# Patient Record
Sex: Male | Born: 1935 | Race: White | Hispanic: No | Smoking: Former smoker
Health system: Southern US, Community
[De-identification: ages and names within clinical notes are randomized; demographics above are authoritative.]

## PROBLEM LIST (undated history)

## (undated) DIAGNOSIS — Z8719 Personal history of other diseases of the digestive system: Secondary | ICD-10-CM

## (undated) DIAGNOSIS — Z9289 Personal history of other medical treatment: Secondary | ICD-10-CM

## (undated) DIAGNOSIS — I639 Cerebral infarction, unspecified: Secondary | ICD-10-CM

## (undated) DIAGNOSIS — R351 Nocturia: Secondary | ICD-10-CM

## (undated) DIAGNOSIS — Z8711 Personal history of peptic ulcer disease: Secondary | ICD-10-CM

## (undated) DIAGNOSIS — R3912 Poor urinary stream: Secondary | ICD-10-CM

## (undated) DIAGNOSIS — R0600 Dyspnea, unspecified: Secondary | ICD-10-CM

## (undated) DIAGNOSIS — Z973 Presence of spectacles and contact lenses: Secondary | ICD-10-CM

## (undated) DIAGNOSIS — Z974 Presence of external hearing-aid: Secondary | ICD-10-CM

## (undated) DIAGNOSIS — G2581 Restless legs syndrome: Secondary | ICD-10-CM

## (undated) DIAGNOSIS — F329 Major depressive disorder, single episode, unspecified: Secondary | ICD-10-CM

## (undated) DIAGNOSIS — E785 Hyperlipidemia, unspecified: Secondary | ICD-10-CM

## (undated) DIAGNOSIS — J209 Acute bronchitis, unspecified: Secondary | ICD-10-CM

## (undated) DIAGNOSIS — C679 Malignant neoplasm of bladder, unspecified: Secondary | ICD-10-CM

## (undated) DIAGNOSIS — G250 Essential tremor: Secondary | ICD-10-CM

## (undated) DIAGNOSIS — Z860100 Personal history of colon polyps, unspecified: Secondary | ICD-10-CM

## (undated) DIAGNOSIS — N400 Enlarged prostate without lower urinary tract symptoms: Secondary | ICD-10-CM

## (undated) DIAGNOSIS — M545 Low back pain, unspecified: Secondary | ICD-10-CM

## (undated) DIAGNOSIS — Z972 Presence of dental prosthetic device (complete) (partial): Secondary | ICD-10-CM

## (undated) DIAGNOSIS — R569 Unspecified convulsions: Secondary | ICD-10-CM

## (undated) DIAGNOSIS — M5137 Other intervertebral disc degeneration, lumbosacral region: Secondary | ICD-10-CM

## (undated) DIAGNOSIS — K760 Fatty (change of) liver, not elsewhere classified: Secondary | ICD-10-CM

## (undated) DIAGNOSIS — R001 Bradycardia, unspecified: Secondary | ICD-10-CM

## (undated) DIAGNOSIS — I2510 Atherosclerotic heart disease of native coronary artery without angina pectoris: Secondary | ICD-10-CM

## (undated) DIAGNOSIS — J449 Chronic obstructive pulmonary disease, unspecified: Secondary | ICD-10-CM

## (undated) DIAGNOSIS — R05 Cough: Secondary | ICD-10-CM

## (undated) DIAGNOSIS — K5730 Diverticulosis of large intestine without perforation or abscess without bleeding: Secondary | ICD-10-CM

## (undated) DIAGNOSIS — F32A Depression, unspecified: Secondary | ICD-10-CM

## (undated) DIAGNOSIS — Z8551 Personal history of malignant neoplasm of bladder: Secondary | ICD-10-CM

## (undated) DIAGNOSIS — M51379 Other intervertebral disc degeneration, lumbosacral region without mention of lumbar back pain or lower extremity pain: Secondary | ICD-10-CM

## (undated) DIAGNOSIS — G8929 Other chronic pain: Secondary | ICD-10-CM

## (undated) DIAGNOSIS — I701 Atherosclerosis of renal artery: Secondary | ICD-10-CM

## (undated) DIAGNOSIS — K219 Gastro-esophageal reflux disease without esophagitis: Secondary | ICD-10-CM

## (undated) DIAGNOSIS — R058 Other specified cough: Secondary | ICD-10-CM

## (undated) DIAGNOSIS — I509 Heart failure, unspecified: Secondary | ICD-10-CM

## (undated) DIAGNOSIS — I10 Essential (primary) hypertension: Secondary | ICD-10-CM

## (undated) DIAGNOSIS — R42 Dizziness and giddiness: Secondary | ICD-10-CM

## (undated) DIAGNOSIS — J189 Pneumonia, unspecified organism: Secondary | ICD-10-CM

## (undated) DIAGNOSIS — M069 Rheumatoid arthritis, unspecified: Secondary | ICD-10-CM

## (undated) DIAGNOSIS — Z87442 Personal history of urinary calculi: Secondary | ICD-10-CM

## (undated) DIAGNOSIS — H409 Unspecified glaucoma: Secondary | ICD-10-CM

## (undated) HISTORY — PX: JOINT REPLACEMENT: SHX530

## (undated) HISTORY — DX: Major depressive disorder, single episode, unspecified: F32.9

## (undated) HISTORY — DX: Depression, unspecified: F32.A

## (undated) HISTORY — PX: CARDIAC CATHETERIZATION: SHX172

## (undated) HISTORY — PX: TONSILLECTOMY AND ADENOIDECTOMY: SUR1326

## (undated) HISTORY — DX: Malignant neoplasm of bladder, unspecified: C67.9

## (undated) HISTORY — DX: Rheumatoid arthritis, unspecified: M06.9

## (undated) HISTORY — DX: Bradycardia, unspecified: R00.1

## (undated) HISTORY — DX: Hyperlipidemia, unspecified: E78.5

## (undated) HISTORY — DX: Atherosclerotic heart disease of native coronary artery without angina pectoris: I25.10

## (undated) HISTORY — DX: Essential (primary) hypertension: I10

## (undated) HISTORY — DX: Gastro-esophageal reflux disease without esophagitis: K21.9

## (undated) HISTORY — DX: Heart failure, unspecified: I50.9

## (undated) HISTORY — DX: Diverticulosis of large intestine without perforation or abscess without bleeding: K57.30

---

## 2000-01-13 ENCOUNTER — Emergency Department (HOSPITAL_COMMUNITY): Admission: EM | Admit: 2000-01-13 | Discharge: 2000-01-13 | Payer: Self-pay | Admitting: Emergency Medicine

## 2000-01-13 ENCOUNTER — Encounter: Payer: Self-pay | Admitting: Emergency Medicine

## 2000-02-06 ENCOUNTER — Encounter: Admission: RE | Admit: 2000-02-06 | Discharge: 2000-02-20 | Payer: Self-pay | Admitting: Internal Medicine

## 2000-03-19 ENCOUNTER — Ambulatory Visit (HOSPITAL_COMMUNITY): Admission: RE | Admit: 2000-03-19 | Discharge: 2000-03-19 | Payer: Self-pay | Admitting: Orthopedic Surgery

## 2000-03-19 ENCOUNTER — Encounter: Payer: Self-pay | Admitting: Orthopedic Surgery

## 2000-04-22 ENCOUNTER — Encounter: Admission: RE | Admit: 2000-04-22 | Discharge: 2000-05-28 | Payer: Self-pay | Admitting: Orthopedic Surgery

## 2000-05-21 ENCOUNTER — Ambulatory Visit (HOSPITAL_COMMUNITY): Admission: RE | Admit: 2000-05-21 | Discharge: 2000-05-21 | Payer: Self-pay | Admitting: Orthopedic Surgery

## 2000-05-21 ENCOUNTER — Encounter: Payer: Self-pay | Admitting: Orthopedic Surgery

## 2000-06-29 ENCOUNTER — Ambulatory Visit (HOSPITAL_COMMUNITY): Admission: RE | Admit: 2000-06-29 | Discharge: 2000-06-29 | Payer: Self-pay | Admitting: Orthopedic Surgery

## 2000-06-29 ENCOUNTER — Encounter: Payer: Self-pay | Admitting: Orthopedic Surgery

## 2000-06-29 HISTORY — PX: HIP ARTHROSCOPY W/ LABRAL DEBRIDEMENT: SHX1749

## 2001-11-11 ENCOUNTER — Encounter: Payer: Self-pay | Admitting: Gastroenterology

## 2002-09-16 ENCOUNTER — Encounter: Payer: Self-pay | Admitting: Urology

## 2002-09-16 ENCOUNTER — Encounter: Admission: RE | Admit: 2002-09-16 | Discharge: 2002-09-16 | Payer: Self-pay | Admitting: Urology

## 2002-09-21 ENCOUNTER — Ambulatory Visit (HOSPITAL_BASED_OUTPATIENT_CLINIC_OR_DEPARTMENT_OTHER): Admission: RE | Admit: 2002-09-21 | Discharge: 2002-09-21 | Payer: Self-pay | Admitting: Interventional Radiology

## 2002-09-21 ENCOUNTER — Encounter (INDEPENDENT_AMBULATORY_CARE_PROVIDER_SITE_OTHER): Payer: Self-pay | Admitting: *Deleted

## 2002-09-21 HISTORY — PX: TRANSURETHRAL RESECTION OF BLADDER TUMOR: SHX2575

## 2003-01-20 ENCOUNTER — Emergency Department (HOSPITAL_COMMUNITY): Admission: EM | Admit: 2003-01-20 | Discharge: 2003-01-20 | Payer: Self-pay | Admitting: Emergency Medicine

## 2003-10-09 ENCOUNTER — Emergency Department (HOSPITAL_COMMUNITY): Admission: EM | Admit: 2003-10-09 | Discharge: 2003-10-09 | Payer: Self-pay | Admitting: Emergency Medicine

## 2005-06-26 ENCOUNTER — Ambulatory Visit: Payer: Self-pay | Admitting: Internal Medicine

## 2005-07-16 ENCOUNTER — Encounter (INDEPENDENT_AMBULATORY_CARE_PROVIDER_SITE_OTHER): Payer: Self-pay | Admitting: Specialist

## 2005-07-16 ENCOUNTER — Ambulatory Visit (HOSPITAL_COMMUNITY): Admission: RE | Admit: 2005-07-16 | Discharge: 2005-07-17 | Payer: Self-pay | Admitting: Specialist

## 2005-07-16 HISTORY — PX: LUMBAR DISC SURGERY: SHX700

## 2006-09-07 ENCOUNTER — Ambulatory Visit: Payer: Self-pay | Admitting: Internal Medicine

## 2006-11-01 ENCOUNTER — Inpatient Hospital Stay (HOSPITAL_COMMUNITY): Admission: EM | Admit: 2006-11-01 | Discharge: 2006-11-03 | Payer: Self-pay | Admitting: Internal Medicine

## 2006-11-01 ENCOUNTER — Encounter: Payer: Self-pay | Admitting: Emergency Medicine

## 2006-11-01 ENCOUNTER — Ambulatory Visit: Payer: Self-pay | Admitting: Internal Medicine

## 2006-11-02 ENCOUNTER — Ambulatory Visit: Payer: Self-pay | Admitting: Cardiovascular Disease

## 2006-11-02 ENCOUNTER — Encounter: Payer: Self-pay | Admitting: Cardiovascular Disease

## 2006-11-06 ENCOUNTER — Ambulatory Visit: Payer: Self-pay

## 2006-11-10 ENCOUNTER — Ambulatory Visit: Payer: Self-pay | Admitting: Internal Medicine

## 2006-12-31 ENCOUNTER — Ambulatory Visit: Payer: Self-pay | Admitting: Internal Medicine

## 2006-12-31 LAB — CONVERTED CEMR LAB
AST: 24 units/L (ref 0–37)
Basophils Relative: 0.3 % (ref 0.0–1.0)
Bilirubin, Direct: 0.1 mg/dL (ref 0.0–0.3)
CO2: 31 meq/L (ref 19–32)
Chloride: 105 meq/L (ref 96–112)
Creatinine, Ser: 1.1 mg/dL (ref 0.4–1.5)
Eosinophils Absolute: 0.2 10*3/uL (ref 0.0–0.6)
Eosinophils Relative: 3.1 % (ref 0.0–5.0)
GFR calc non Af Amer: 71 mL/min
Glucose, Bld: 104 mg/dL — ABNORMAL HIGH (ref 70–99)
HCT: 45.1 % (ref 39.0–52.0)
Lymphocytes Relative: 20.4 % (ref 12.0–46.0)
MCV: 89.2 fL (ref 78.0–100.0)
Neutrophils Relative %: 67 % (ref 43.0–77.0)
RBC: 5.05 M/uL (ref 4.22–5.81)
Sodium: 141 meq/L (ref 135–145)
TSH: 0.76 microintl units/mL (ref 0.35–5.50)
Total Bilirubin: 0.4 mg/dL (ref 0.3–1.2)
Total Protein: 6.7 g/dL (ref 6.0–8.3)
WBC: 6.9 10*3/uL (ref 4.5–10.5)

## 2007-01-11 ENCOUNTER — Ambulatory Visit: Payer: Self-pay | Admitting: Internal Medicine

## 2007-02-05 ENCOUNTER — Ambulatory Visit: Payer: Self-pay

## 2007-02-08 ENCOUNTER — Ambulatory Visit: Payer: Self-pay | Admitting: Internal Medicine

## 2007-02-08 LAB — CONVERTED CEMR LAB
Basophils Absolute: 0 10*3/uL (ref 0.0–0.1)
Calcium: 9.5 mg/dL (ref 8.4–10.5)
Chloride: 102 meq/L (ref 96–112)
Eosinophils Absolute: 0.1 10*3/uL (ref 0.0–0.6)
GFR calc Af Amer: 95 mL/min
GFR calc non Af Amer: 79 mL/min
Lymphocytes Relative: 25.5 % (ref 12.0–46.0)
MCHC: 34.4 g/dL (ref 30.0–36.0)
MCV: 90.8 fL (ref 78.0–100.0)
Monocytes Relative: 5.4 % (ref 3.0–11.0)
Neutro Abs: 4.3 10*3/uL (ref 1.4–7.7)
Platelets: 275 10*3/uL (ref 150–400)
RBC: 5.07 M/uL (ref 4.22–5.81)
aPTT: 32.1 s (ref 26.5–36.5)

## 2007-02-12 ENCOUNTER — Inpatient Hospital Stay (HOSPITAL_BASED_OUTPATIENT_CLINIC_OR_DEPARTMENT_OTHER): Admission: RE | Admit: 2007-02-12 | Discharge: 2007-02-12 | Payer: Self-pay | Admitting: Cardiovascular Disease

## 2007-02-12 ENCOUNTER — Ambulatory Visit: Payer: Self-pay | Admitting: Cardiovascular Disease

## 2007-03-12 ENCOUNTER — Ambulatory Visit: Payer: Self-pay | Admitting: Internal Medicine

## 2007-08-25 ENCOUNTER — Encounter: Payer: Self-pay | Admitting: Internal Medicine

## 2007-11-24 ENCOUNTER — Encounter: Payer: Self-pay | Admitting: Internal Medicine

## 2007-11-24 DIAGNOSIS — E785 Hyperlipidemia, unspecified: Secondary | ICD-10-CM

## 2007-11-24 DIAGNOSIS — N183 Chronic kidney disease, stage 3 unspecified: Secondary | ICD-10-CM | POA: Insufficient documentation

## 2007-11-24 DIAGNOSIS — M5137 Other intervertebral disc degeneration, lumbosacral region: Secondary | ICD-10-CM | POA: Insufficient documentation

## 2007-11-24 DIAGNOSIS — M069 Rheumatoid arthritis, unspecified: Secondary | ICD-10-CM

## 2007-11-24 DIAGNOSIS — K219 Gastro-esophageal reflux disease without esophagitis: Secondary | ICD-10-CM | POA: Insufficient documentation

## 2007-11-24 DIAGNOSIS — Z9089 Acquired absence of other organs: Secondary | ICD-10-CM | POA: Insufficient documentation

## 2007-11-24 DIAGNOSIS — I10 Essential (primary) hypertension: Secondary | ICD-10-CM

## 2007-11-24 DIAGNOSIS — K449 Diaphragmatic hernia without obstruction or gangrene: Secondary | ICD-10-CM

## 2007-11-24 DIAGNOSIS — N259 Disorder resulting from impaired renal tubular function, unspecified: Secondary | ICD-10-CM

## 2008-02-07 ENCOUNTER — Ambulatory Visit: Payer: Self-pay | Admitting: Internal Medicine

## 2008-02-07 DIAGNOSIS — R059 Cough, unspecified: Secondary | ICD-10-CM | POA: Insufficient documentation

## 2008-02-07 DIAGNOSIS — Z860100 Personal history of colon polyps, unspecified: Secondary | ICD-10-CM | POA: Insufficient documentation

## 2008-02-07 DIAGNOSIS — C679 Malignant neoplasm of bladder, unspecified: Secondary | ICD-10-CM | POA: Insufficient documentation

## 2008-02-07 DIAGNOSIS — R05 Cough: Secondary | ICD-10-CM

## 2008-02-07 DIAGNOSIS — J309 Allergic rhinitis, unspecified: Secondary | ICD-10-CM

## 2008-02-07 LAB — CONVERTED CEMR LAB
ALT: 29 units/L (ref 0–53)
Albumin: 4.2 g/dL (ref 3.5–5.2)
BUN: 7 mg/dL (ref 6–23)
Bacteria, UA: NEGATIVE
Bilirubin Urine: NEGATIVE
Bilirubin, Direct: 0.1 mg/dL (ref 0.0–0.3)
CO2: 30 meq/L (ref 19–32)
Calcium: 9.2 mg/dL (ref 8.4–10.5)
Creatinine, Ser: 0.9 mg/dL (ref 0.4–1.5)
Crystals: NEGATIVE
Eosinophils Absolute: 0.1 10*3/uL (ref 0.0–0.7)
Eosinophils Relative: 2.7 % (ref 0.0–5.0)
GFR calc Af Amer: 107 mL/min
Glucose, Bld: 112 mg/dL — ABNORMAL HIGH (ref 70–99)
HCT: 46 % (ref 39.0–52.0)
HDL: 30.5 mg/dL — ABNORMAL LOW (ref >39.0)
Hemoglobin, Urine: NEGATIVE
Hemoglobin: 15.8 g/dL (ref 13.0–17.0)
MCV: 92.4 fL (ref 78.0–100.0)
Monocytes Absolute: 0.4 10*3/uL (ref 0.1–1.0)
Monocytes Relative: 8.2 % (ref 3.0–12.0)
Neutro Abs: 3.3 10*3/uL (ref 1.4–7.7)
PSA: 0.21 ng/mL (ref 0.10–4.00)
Platelets: 205 10*3/uL (ref 150–400)
RDW: 11.7 % (ref 11.5–14.6)
Sodium: 142 meq/L (ref 135–145)
Total CHOL/HDL Ratio: 4.5
Total Protein, Urine: NEGATIVE mg/dL
Total Protein: 6.7 g/dL (ref 6.0–8.3)
Triglycerides: 92 mg/dL (ref 0–149)
Urine Glucose: NEGATIVE mg/dL
Urobilinogen, UA: 0.2 (ref 0.0–1.0)
WBC: 5.2 10*3/uL (ref 4.5–10.5)

## 2008-02-11 ENCOUNTER — Ambulatory Visit: Payer: Self-pay | Admitting: Gastroenterology

## 2008-02-22 ENCOUNTER — Telehealth: Payer: Self-pay | Admitting: Gastroenterology

## 2008-02-28 ENCOUNTER — Ambulatory Visit: Payer: Self-pay | Admitting: Gastroenterology

## 2008-03-21 ENCOUNTER — Telehealth (INDEPENDENT_AMBULATORY_CARE_PROVIDER_SITE_OTHER): Payer: Self-pay | Admitting: *Deleted

## 2008-03-24 ENCOUNTER — Ambulatory Visit: Payer: Self-pay | Admitting: Internal Medicine

## 2008-10-06 ENCOUNTER — Ambulatory Visit: Payer: Self-pay | Admitting: Cardiology

## 2008-10-06 ENCOUNTER — Inpatient Hospital Stay (HOSPITAL_COMMUNITY): Admission: EM | Admit: 2008-10-06 | Discharge: 2008-10-07 | Payer: Self-pay | Admitting: Emergency Medicine

## 2008-10-06 ENCOUNTER — Ambulatory Visit: Payer: Self-pay | Admitting: Internal Medicine

## 2008-10-17 ENCOUNTER — Ambulatory Visit: Payer: Self-pay | Admitting: Internal Medicine

## 2008-10-17 DIAGNOSIS — R42 Dizziness and giddiness: Secondary | ICD-10-CM | POA: Insufficient documentation

## 2008-10-17 DIAGNOSIS — R079 Chest pain, unspecified: Secondary | ICD-10-CM | POA: Insufficient documentation

## 2008-10-17 LAB — CONVERTED CEMR LAB
BUN: 12 mg/dL (ref 6–23)
Creatinine, Ser: 1 mg/dL (ref 0.4–1.5)
GFR calc non Af Amer: 78.23 mL/min (ref >60)
Glucose, Bld: 105 mg/dL — ABNORMAL HIGH (ref 70–99)
Potassium: 3.9 meq/L (ref 3.5–5.1)

## 2008-10-20 ENCOUNTER — Encounter: Payer: Self-pay | Admitting: Internal Medicine

## 2008-10-23 LAB — CONVERTED CEMR LAB
Metaneph Total, Ur: 251 ug/24hr (ref 224–832)
Metanephrines, Ur: 92 (ref 90–315)

## 2008-11-02 ENCOUNTER — Encounter: Payer: Self-pay | Admitting: Internal Medicine

## 2008-11-02 ENCOUNTER — Ambulatory Visit: Payer: Self-pay

## 2008-11-07 ENCOUNTER — Ambulatory Visit: Payer: Self-pay | Admitting: Internal Medicine

## 2009-01-30 ENCOUNTER — Telehealth (INDEPENDENT_AMBULATORY_CARE_PROVIDER_SITE_OTHER): Payer: Self-pay | Admitting: *Deleted

## 2009-03-13 ENCOUNTER — Ambulatory Visit: Payer: Self-pay | Admitting: Cardiovascular Disease

## 2009-03-14 ENCOUNTER — Inpatient Hospital Stay (HOSPITAL_COMMUNITY): Admission: EM | Admit: 2009-03-14 | Discharge: 2009-03-14 | Payer: Self-pay | Admitting: Emergency Medicine

## 2009-03-20 ENCOUNTER — Telehealth: Payer: Self-pay | Admitting: Gastroenterology

## 2009-03-20 ENCOUNTER — Ambulatory Visit: Payer: Self-pay | Admitting: Internal Medicine

## 2009-03-20 DIAGNOSIS — I498 Other specified cardiac arrhythmias: Secondary | ICD-10-CM | POA: Insufficient documentation

## 2009-03-28 DIAGNOSIS — K5730 Diverticulosis of large intestine without perforation or abscess without bleeding: Secondary | ICD-10-CM | POA: Insufficient documentation

## 2009-03-28 DIAGNOSIS — K7689 Other specified diseases of liver: Secondary | ICD-10-CM | POA: Insufficient documentation

## 2009-04-03 ENCOUNTER — Ambulatory Visit: Payer: Self-pay | Admitting: Gastroenterology

## 2009-04-17 ENCOUNTER — Ambulatory Visit (HOSPITAL_COMMUNITY): Admission: RE | Admit: 2009-04-17 | Discharge: 2009-04-17 | Payer: Self-pay | Admitting: Gastroenterology

## 2009-04-19 ENCOUNTER — Ambulatory Visit: Payer: Self-pay | Admitting: Internal Medicine

## 2009-05-01 ENCOUNTER — Telehealth: Payer: Self-pay | Admitting: Gastroenterology

## 2009-05-04 ENCOUNTER — Encounter: Payer: Self-pay | Admitting: Gastroenterology

## 2009-05-09 ENCOUNTER — Ambulatory Visit: Payer: Self-pay | Admitting: Internal Medicine

## 2009-06-13 ENCOUNTER — Telehealth: Payer: Self-pay | Admitting: Internal Medicine

## 2009-06-15 ENCOUNTER — Telehealth: Payer: Self-pay | Admitting: Internal Medicine

## 2009-06-19 ENCOUNTER — Telehealth: Payer: Self-pay | Admitting: Gastroenterology

## 2009-06-28 ENCOUNTER — Telehealth: Payer: Self-pay | Admitting: Internal Medicine

## 2009-06-29 ENCOUNTER — Telehealth: Payer: Self-pay | Admitting: Internal Medicine

## 2009-07-02 ENCOUNTER — Telehealth: Payer: Self-pay | Admitting: Gastroenterology

## 2009-07-03 ENCOUNTER — Encounter: Payer: Self-pay | Admitting: Internal Medicine

## 2009-07-03 ENCOUNTER — Telehealth: Payer: Self-pay | Admitting: Internal Medicine

## 2009-07-04 ENCOUNTER — Telehealth: Payer: Self-pay | Admitting: Internal Medicine

## 2009-07-09 ENCOUNTER — Encounter: Payer: Self-pay | Admitting: Internal Medicine

## 2009-07-10 ENCOUNTER — Ambulatory Visit: Payer: Self-pay | Admitting: Gastroenterology

## 2009-07-10 ENCOUNTER — Encounter: Payer: Self-pay | Admitting: Internal Medicine

## 2009-07-10 DIAGNOSIS — K625 Hemorrhage of anus and rectum: Secondary | ICD-10-CM | POA: Insufficient documentation

## 2009-07-10 DIAGNOSIS — R197 Diarrhea, unspecified: Secondary | ICD-10-CM | POA: Insufficient documentation

## 2009-07-10 LAB — CONVERTED CEMR LAB
Basophils Relative: 0.1 % (ref 0.0–3.0)
Eosinophils Absolute: 0.1 10*3/uL (ref 0.0–0.7)
Ferritin: 23.3 ng/mL (ref 22.0–322.0)
Hemoglobin: 15.5 g/dL (ref 13.0–17.0)
Iron: 117 ug/dL (ref 42–165)
MCHC: 33.4 g/dL (ref 30.0–36.0)
MCV: 93.2 fL (ref 78.0–100.0)
Monocytes Absolute: 0.6 10*3/uL (ref 0.1–1.0)
Neutro Abs: 3.7 10*3/uL (ref 1.4–7.7)
RBC: 4.98 M/uL (ref 4.22–5.81)
RDW: 12.1 % (ref 11.5–14.6)
Vitamin B-12: 435 pg/mL (ref 211–911)

## 2009-07-11 ENCOUNTER — Ambulatory Visit: Payer: Self-pay | Admitting: Gastroenterology

## 2009-07-11 ENCOUNTER — Telehealth: Payer: Self-pay | Admitting: Gastroenterology

## 2009-07-16 ENCOUNTER — Encounter: Payer: Self-pay | Admitting: Gastroenterology

## 2009-07-17 ENCOUNTER — Telehealth: Payer: Self-pay | Admitting: Internal Medicine

## 2009-07-26 ENCOUNTER — Telehealth: Payer: Self-pay | Admitting: Internal Medicine

## 2009-08-06 ENCOUNTER — Telehealth: Payer: Self-pay | Admitting: Internal Medicine

## 2009-08-09 ENCOUNTER — Telehealth: Payer: Self-pay | Admitting: Gastroenterology

## 2009-08-10 DIAGNOSIS — R21 Rash and other nonspecific skin eruption: Secondary | ICD-10-CM

## 2009-08-19 ENCOUNTER — Emergency Department (HOSPITAL_COMMUNITY): Admission: EM | Admit: 2009-08-19 | Discharge: 2009-08-19 | Payer: Self-pay | Admitting: Emergency Medicine

## 2009-08-20 ENCOUNTER — Telehealth: Payer: Self-pay | Admitting: Internal Medicine

## 2009-09-15 ENCOUNTER — Encounter: Payer: Self-pay | Admitting: Internal Medicine

## 2009-09-17 ENCOUNTER — Telehealth (INDEPENDENT_AMBULATORY_CARE_PROVIDER_SITE_OTHER): Payer: Self-pay | Admitting: *Deleted

## 2009-09-28 ENCOUNTER — Encounter: Payer: Self-pay | Admitting: Internal Medicine

## 2009-10-01 ENCOUNTER — Encounter: Payer: Self-pay | Admitting: Internal Medicine

## 2009-10-01 ENCOUNTER — Ambulatory Visit: Payer: Self-pay

## 2009-11-27 ENCOUNTER — Ambulatory Visit: Payer: Self-pay | Admitting: Internal Medicine

## 2010-02-05 ENCOUNTER — Ambulatory Visit: Payer: Self-pay | Admitting: Internal Medicine

## 2010-02-05 DIAGNOSIS — M21619 Bunion of unspecified foot: Secondary | ICD-10-CM | POA: Insufficient documentation

## 2010-04-25 ENCOUNTER — Ambulatory Visit: Payer: Self-pay | Admitting: Internal Medicine

## 2010-05-14 ENCOUNTER — Ambulatory Visit: Payer: Self-pay | Admitting: Internal Medicine

## 2010-05-14 ENCOUNTER — Telehealth: Payer: Self-pay | Admitting: Internal Medicine

## 2010-05-14 DIAGNOSIS — M542 Cervicalgia: Secondary | ICD-10-CM | POA: Insufficient documentation

## 2010-05-24 ENCOUNTER — Telehealth: Payer: Self-pay | Admitting: Internal Medicine

## 2010-07-08 ENCOUNTER — Emergency Department (HOSPITAL_COMMUNITY)
Admission: EM | Admit: 2010-07-08 | Discharge: 2010-07-08 | Payer: Self-pay | Source: Home / Self Care | Admitting: Emergency Medicine

## 2010-07-10 ENCOUNTER — Ambulatory Visit
Admission: RE | Admit: 2010-07-10 | Discharge: 2010-07-10 | Payer: Self-pay | Source: Home / Self Care | Attending: Internal Medicine | Admitting: Internal Medicine

## 2010-08-15 NOTE — Letter (Signed)
Summary: Patient Notice- Colon Biospy Results  [facility]  [address] [place], [place] [address]   Phone: [phone]  Fax: [phone]        July 16, 2009 MRN: [id number]    [facility] [address] [place], [place]  [address]    Dear [name],  I am pleased to inform you that the biopsies taken during your recent colonoscopy did not show any evidence of cancer upon pathologic examination.  Additional information/recommendations:  __No further action is needed at this time.  Please follow-up with      your primary care physician for your other healthcare needs.  __Please call [phone] to schedule a return visit to review      your condition.  _x_Continue with the treatment plan as outlined on the day of your      exam.  __You should have a repeat colonoscopy examination for this problem           in _ years.  Please call us if you are having persistent problems or have questions about your condition that have not been fully answered at this time.  Sincerely,  Mardella Layman MD [facility]   This letter has been electronically signed by your physician.  Appended Document: Patient Notice- Colon Biospy Results Letter mailed 1.5.11

## 2010-08-15 NOTE — Progress Notes (Signed)
       New/Updated Medications: CALAMINE  LOTN (CALAMINE) Apply moderate amount to affected area three times a day as needed TAMSULOSIN HCL 0.4 MG CAPS (TAMSULOSIN HCL) 1 by mouth once daily OMEPRAZOLE 20 MG CPDR (OMEPRAZOLE) 2 by mouth once daily RANITIDINE HCL 300 MG TABS (RANITIDINE HCL) 1 by mouth by mouth at bedtime PROPOXYPHENE N-APAP 100-650 MG TABS (PROPOXYPHENE N-APAP) 1 by mouth twice a day as needed

## 2010-08-15 NOTE — Assessment & Plan Note (Signed)
Summary: NECK PAIN--STC   Vital Signs:  Patient profile:   75 year old male Height:      67 inches Weight:      182.25 pounds BMI:     28.65 O2 Sat:      94 % on Room air Temp:     98.6 degrees F oral Pulse rate:   64 / minute BP sitting:   112 / 70  (left arm) Cuff size:   regular  Vitals Entered By: Zella Ball Ewing CMA (AAMA) (May 14, 2010 11:21 AM)  O2 Flow:  Room air CC: Neck pain, refills/RE   Primary Care Provider:  Oliver Barre ,MD  CC:  Neck pain and refills/RE.  History of Present Illness: here for f/u - states he is to have CPX at [facility] in a few wks;  c/o mild intermittent left neck pain  for several wks to month, grad worse since onset,  sharp, worse to turn the head ot the right,  occasionally severe for hours;  denies UE or LE pain/weak/numb, bowel or bladder change, fever, wt loss, gait change, injury or fall.  Plans to see Dr Shelle Iron for ? need of MRI;  tramadol just not working like the darvocet did, good complaince with gabapentin but not working, with pain  at worst mostly at night.  Pt denies CP, worsening sob, doe, wheezing, orthopnea, pnd, worsening LE edema, palps, dizziness or syncope  Pt denies other new neuro symptoms such as headache, facial or extremity weakness . Pt denies polydipsia, polyuria  Overall good compliance with meds, trying to follow low chol, DM diet, wt stable, little excercise however Pt states good ability with ADL's, low fall risk, home safety reviewed and adequate, no significant change in hearing or vision, trying to follow lower chol diet, and occasionally active only with regular excercise. Denies worsening GI symptoms such as overt reflux, or dysphagia  Preventive Screening-Counseling & Management      Drug Use:  no.    Problems Prior to Update: 1)  Neck Pain, Left  (ICD-723.1) 2)  Bunion, Right Foot  (ICD-727.1) 3)  Rash and Other Nonspecific Skin Eruption  (ICD-782.1) 4)  Diarrhea  (ICD-787.91) 5)  Rectal Bleeding  (ICD-569.3) 6)   Diarrhea  (ICD-787.91) 7)  Fatty Liver Disease  (ICD-571.8) 8)  Diverticulosis of Colon  (ICD-562.10) 9)  Bradycardia  (ICD-427.89) 10)  Chest Pain  (ICD-786.50) 11)  Dizziness  (ICD-780.4) 12)  Hepatotoxicity, Drug-induced, Risk of  (ICD-V58.69) 13)  Bladder Cancer  (ICD-188.9) 14)  Special Screening Malignant Neoplasm of Prostate  (ICD-V76.44) 15)  Cough  (ICD-786.2) 16)  Colonic Polyps, Hx of  (ICD-V12.72) 17)  Allergic Rhinitis  (ICD-477.9) 18)  Renal Insufficiency  (ICD-588.9) 19)  Adenoidectomy, Hx of  (ICD-V45.79) 20)  Disc Disease, Lumbar  (ICD-722.52) 21)  Hiatal Hernia With Reflux  (ICD-553.3) 22)  Rheumatoid Arthritis  (ICD-714.0) 23)  Hypertension  (ICD-401.9) 24)  Hyperlipidemia  (ICD-272.4) 25)  Gerd  (ICD-530.81)  Medications Prior to Update: 1)  Tramadol Hcl 50 Mg Tabs (Tramadol Hcl) .Marland Kitchen.. 1 By Mouth Qid As Needed Pain 2)  Gabapentin 300 Mg  Caps (Gabapentin) .... Take 1 By Mouth Qid 3)  Fluticasone Propionate 50 Mcg/act  Susp (Fluticasone Propionate) .... Use 2 Spray Once Daily Each Side 4)  Primidone 250 Mg  Tabs (Primidone) .... Take 1/2 By Mouth Three Times A Day 5)  Lisinopril 40 Mg  Tabs (Lisinopril) .Marland Kitchen.. 1 By Mouth Once Daily 6)  Adult Aspirin Low Strength  81 Mg  Tbdp (Aspirin) .... Take 1 By Mouth Qd 7)  Travatan 0.004 %  Soln (Travoprost) .Marland Kitchen.. 1 Drops Both Eye Qhs 8)  Vitamin D3 1000 Unit  Tabs (Cholecalciferol) .Marland Kitchen.. 1 By Mouth Once Daily 9)  Multivitamins  Tabs (Multiple Vitamin) .Marland Kitchen.. 1 By Mouth Once Daily 10)  Vitamin C Cr 500 Mg Cr-Caps (Ascorbic Acid) .Marland Kitchen.. 1 By Mouth Once Daily 11)  Amlodipine Besylate 5 Mg Tabs (Amlodipine Besylate) .... Take One Tablet By Mouth Daily 12)  Simvastatin 40 Mg Tabs (Simvastatin) .Marland Kitchen.. 1 1/2 At Bedtime 13)  Zyrtec Allergy 10 Mg Tabs (Cetirizine Hcl) .Marland Kitchen.. 1 By Mouth Once Daily 14)  Nitroglycerin 0.4 Mg Subl (Nitroglycerin) .Marland Kitchen.. 1 Subl As Needed 15)  Calcium 600 600 Mg Tabs (Calcium Carbonate) .Marland Kitchen.. 1 By Mouth Once Daily 16)   Hydrochlorothiazide 25 Mg Tabs (Hydrochlorothiazide) .... 1/2 By Mouth Once Daily 17)  Meclizine Hcl 12.5 Mg Tabs (Meclizine Hcl) .Marland Kitchen.. 1 - 2 By Mouth Q 6 Hrs As Needed Dizziness/room Going Round 18)  Hyoscyamine Sulfate 0.125 Mg Tabs (Hyoscyamine Sulfate) .... Take 1 Q 4 Hrs Prn 19)  Anusol-Hc 25 Mg  Supp (Hydrocortisone Acetate) .Marland Kitchen.. 1 Per Rectum Bid 20)  Anamantle Hc 3-0.5 %  Crea (Lidocaine-Hydrocortisone Ace) .... Apply To Rectal Area Bid 21)  Calamine  Lotn (Calamine) .... Apply Moderate Amount To Affected Area Three Times A Day As Needed 22)  Tamsulosin Hcl 0.4 Mg Caps (Tamsulosin Hcl) .Marland Kitchen.. 1 By Mouth Once Daily 23)  Omeprazole 20 Mg Cpdr (Omeprazole) .... 2 By Mouth Once Daily 24)  Ranitidine Hcl 300 Mg Tabs (Ranitidine Hcl) .Marland Kitchen.. 1 By Mouth By Mouth At Bedtime  Current Medications (verified): 1)  Gabapentin 300 Mg  Caps (Gabapentin) .... Take 1 By Mouth Three Times A Day and 2 By Mouth At Bedtime 2)  Fluticasone Propionate 50 Mcg/act  Susp (Fluticasone Propionate) .... Use 2 Spray Once Daily Each Side 3)  Primidone 250 Mg  Tabs (Primidone) .... Take 1/2 By Mouth Three Times A Day 4)  Lisinopril 40 Mg  Tabs (Lisinopril) .Marland Kitchen.. 1 By Mouth Once Daily 5)  Adult Aspirin Low Strength 81 Mg  Tbdp (Aspirin) .... Take 1 By Mouth Qd 6)  Travatan 0.004 %  Soln (Travoprost) .Marland Kitchen.. 1 Drops Both Eye Qhs 7)  Vitamin D3 1000 Unit  Tabs (Cholecalciferol) .Marland Kitchen.. 1 By Mouth Once Daily 8)  Multivitamins  Tabs (Multiple Vitamin) .Marland Kitchen.. 1 By Mouth Once Daily 9)  Vitamin C Cr 500 Mg Cr-Caps (Ascorbic Acid) .Marland Kitchen.. 1 By Mouth Once Daily 10)  Amlodipine Besylate 5 Mg Tabs (Amlodipine Besylate) .... Take One Tablet By Mouth Daily 11)  Simvastatin 40 Mg Tabs (Simvastatin) .Marland Kitchen.. 1 1/2 At Bedtime 12)  Zyrtec Allergy 10 Mg Tabs (Cetirizine Hcl) .Marland Kitchen.. 1 By Mouth Once Daily 13)  Nitroglycerin 0.4 Mg Subl (Nitroglycerin) .Marland Kitchen.. 1 Subl As Needed 14)  Calcium 600 600 Mg Tabs (Calcium Carbonate) .Marland Kitchen.. 1 By Mouth Once Daily 15)   Hydrochlorothiazide 25 Mg Tabs (Hydrochlorothiazide) .... 1/2 By Mouth Once Daily 16)  Meclizine Hcl 12.5 Mg Tabs (Meclizine Hcl) .Marland Kitchen.. 1 - 2 By Mouth Q 6 Hrs As Needed Dizziness/room Going Round 17)  Hyoscyamine Sulfate 0.125 Mg Tabs (Hyoscyamine Sulfate) .... Take 1 Q 4 Hrs Prn 18)  Anusol-Hc 25 Mg  Supp (Hydrocortisone Acetate) .Marland Kitchen.. 1 Per Rectum Bid 19)  Anamantle Hc 3-0.5 %  Crea (Lidocaine-Hydrocortisone Ace) .... Apply To Rectal Area Bid 20)  Tamsulosin Hcl 0.4 Mg Caps (Tamsulosin Hcl) .Marland KitchenMarland KitchenMarland Kitchen  1 By Mouth Once Daily 21)  Omeprazole 20 Mg Cpdr (Omeprazole) .... 2 By Mouth Once Daily 22)  Ranitidine Hcl 300 Mg Tabs (Ranitidine Hcl) .Marland Kitchen.. 1 By Mouth By Mouth At Bedtime 23)  Clobetasol Propionate 0.05 % Oint (Clobetasol Propionate) .... Apply To Affected Area Once Daily  Allergies (verified): 1)  ! Sulfa  Past History:  Past Medical History: Last updated: 03/28/2009 Current Problems:  FATTY LIVER DISEASE (ICD-571.8) DIVERTICULOSIS OF COLON (ICD-562.10) GASTRITIS, CHRONIC (ICD-535.10) ESOPHAGITIS (ICD-530.10) ABDOMINAL TENDERNESS, EPIGASTRIC (ICD-789.66) BRADYCARDIA (ICD-427.89) CHEST PAIN (ICD-786.50) DIZZINESS (ICD-780.4) HEPATOTOXICITY, DRUG-INDUCED, RISK OF (ICD-V58.69) BLADDER CANCER (ICD-188.9) SPECIAL SCREENING MALIGNANT NEOPLASM OF PROSTATE (ICD-V76.44) COUGH (ICD-786.2) COLONIC POLYPS, HX OF (ICD-V12.72) ALLERGIC RHINITIS (ICD-477.9) RENAL INSUFFICIENCY (ICD-588.9) ADENOIDECTOMY, HX OF (ICD-V45.79) CORONARY ARTERY DISEASE (ICD-414.00) CONGESTIVE HEART FAILURE (ICD-428.0) DISC DISEASE, LUMBAR (ICD-722.52) PEPTIC ULCER DISEASE (ICD-533.90) HIATAL HERNIA WITH REFLUX (ICD-553.3) RHEUMATOID ARTHRITIS (ICD-714.0) HYPERTENSION (ICD-401.9) HYPERLIPIDEMIA (ICD-272.4) GERD (ICD-530.81)  Past Surgical History: Last updated: 04/03/2009 Percutaneous transluminal coronary angioplasty Tonsillectomy Stress Cardiolite ( 11/06/2006) 2-D Echo (11/02/2006) Upper Endoscopy (  03/01/2007) s/p left hip arthroplasty 2002 s/p bladder surgury s/p lumbar surgury 1/08  Transurethral resection of bladder cancer, Dr. Aldean Ast. Cataract Extraction  Social History: Last updated: 05/14/2010 Married 2 children retired from [employer] power Never Smoked Alcohol use-no Drug use-no  Risk Factors: Smoking Status: never (02/07/2008)  Social History: Married 2 children retired from [employer] Never Smoked Alcohol use-no Drug use-no Drug Use:  no  Review of Systems       all otherwise negative per pt -    Physical Exam  General:  alert and overweight-appearing.   Head:  normocephalic and atraumatic.   Eyes:  vision grossly intact, pupils equal, and pupils round.   Ears:  R ear normal and L ear normal.   Nose:  no external deformity and no nasal discharge.   Mouth:  no gingival abnormalities and pharynx pink and moist.   Neck:  supple and no masses.  , neck FROM, NT  - no spasm noted Lungs:  normal respiratory effort, R decreased breath sounds, and L decreased breath sounds.   Heart:  normal rate and regular rhythm.   Msk:  spine nontender Extremities:  no edema, no erythema  Neurologic:  strength normal in all extremities and gait normal.     Impression & Recommendations:  Problem # 1:  NECK PAIN, LEFT (ICD-723.1)  The following medications were removed from the medication list:    Tramadol Hcl 50 Mg Tabs (Tramadol hcl) .Marland Kitchen... 1 by mouth qid as needed pain His updated medication list for this problem includes:    Adult Aspirin Low Strength 81 Mg Tbdp (Aspirin) .Marland Kitchen... Take 1 by mouth qd I suspect underlying on goin pain due toc-spine DJD/DDD - pt plans to also see dr Roosevelt Locks, for now to incr the gabapentin 300 mg- 1 three times a day, and 2 at night. Continue all otehr previous medications as before this visit   Problem # 2:  HYPERTENSION (ICD-401.9)  His updated medication list for this problem includes:    Lisinopril 40 Mg Tabs (Lisinopril) .Marland Kitchen... 1  by mouth once daily    Amlodipine Besylate 5 Mg Tabs (Amlodipine besylate) .Marland Kitchen... Take one tablet by mouth daily    Hydrochlorothiazide 25 Mg Tabs (Hydrochlorothiazide) .Marland Kitchen... 1/2 by mouth once daily  BP today: 112/70 Prior BP: 100/60 (04/25/2010)  Labs Reviewed: K+: 3.9 (10/17/2008) Creat: : 1.0 (10/17/2008)   Chol: 138 (02/07/2008)   HDL: 30.5 (02/07/2008)   LDL: 89 (  02/07/2008)   TG: 92 (02/07/2008) stable overall by hx and exam, ok to continue meds/tx as is, declines labs today  Problem # 3:  HYPERLIPIDEMIA (ICD-272.4)  His updated medication list for this problem includes:    Simvastatin 40 Mg Tabs (Simvastatin) .Marland Kitchen... 1 1/2 at bedtime  Labs Reviewed: SGOT: 23 (02/07/2008)   SGPT: 29 (02/07/2008)   HDL:30.5 (02/07/2008)  LDL:89 (02/07/2008)  Chol:138 (02/07/2008)  Trig:92 (02/07/2008) d/w pt - ok to cont statin, declines labs for now but pt states will [facility], Pt to continue diet efforts, good med tolerance - goal LDL less than 100  Problem # 4:  GERD (ICD-530.81)  His updated medication list for this problem includes:    Hyoscyamine Sulfate 0.125 Mg Tabs (Hyoscyamine sulfate) .Marland Kitchen... Take 1 q 4 hrs prn    Omeprazole 20 Mg Cpdr (Omeprazole) .Marland Kitchen... 2 by mouth once daily    Ranitidine Hcl 300 Mg Tabs (Ranitidine hcl) .Marland Kitchen... 1 by mouth by mouth at bedtime stable overall by hx and exam, ok to continue meds/tx as is   Complete Medication List: 1)  Gabapentin 300 Mg Caps (Gabapentin) .... Take 1 by mouth three times a day and 2 by mouth at bedtime 2)  Fluticasone Propionate 50 Mcg/act Susp (Fluticasone propionate) .... Use 2 spray once daily each side 3)  Primidone 250 Mg Tabs (Primidone) .... Take 1/2 by mouth three times a day 4)  Lisinopril 40 Mg Tabs (Lisinopril) .Marland Kitchen.. 1 by mouth once daily 5)  Adult Aspirin Low Strength 81 Mg Tbdp (Aspirin) .... Take 1 by mouth qd 6)  Travatan 0.004 % Soln (Travoprost) .Marland Kitchen.. 1 drops both eye qhs 7)  Vitamin D3 1000 Unit Tabs  (Cholecalciferol) .Marland Kitchen.. 1 by mouth once daily 8)  Multivitamins Tabs (Multiple vitamin) .Marland Kitchen.. 1 by mouth once daily 9)  Vitamin C Cr 500 Mg Cr-caps (Ascorbic acid) .Marland Kitchen.. 1 by mouth once daily 10)  Amlodipine Besylate 5 Mg Tabs (Amlodipine besylate) .... Take one tablet by mouth daily 11)  Simvastatin 40 Mg Tabs (Simvastatin) .Marland Kitchen.. 1 1/2 at bedtime 12)  Zyrtec Allergy 10 Mg Tabs (Cetirizine hcl) .Marland Kitchen.. 1 by mouth once daily 13)  Nitroglycerin 0.4 Mg Subl (Nitroglycerin) .Marland Kitchen.. 1 subl as needed 14)  Calcium 600 600 Mg Tabs (Calcium carbonate) .Marland Kitchen.. 1 by mouth once daily 15)  Hydrochlorothiazide 25 Mg Tabs (Hydrochlorothiazide) .... 1/2 by mouth once daily 16)  Meclizine Hcl 12.5 Mg Tabs (Meclizine hcl) .Marland Kitchen.. 1 - 2 by mouth q 6 hrs as needed dizziness/room going round 17)  Hyoscyamine Sulfate 0.125 Mg Tabs (Hyoscyamine sulfate) .... Take 1 q 4 hrs prn 18)  Anusol-hc 25 Mg Supp (Hydrocortisone acetate) .Marland Kitchen.. 1 per rectum bid 19)  Anamantle Hc 3-0.5 % Crea (Lidocaine-hydrocortisone ace) .... Apply to rectal area bid 20)  Tamsulosin Hcl 0.4 Mg Caps (Tamsulosin hcl) .Marland Kitchen.. 1 by mouth once daily 21)  Omeprazole 20 Mg Cpdr (Omeprazole) .... 2 by mouth once daily 22)  Ranitidine Hcl 300 Mg Tabs (Ranitidine hcl) .Marland Kitchen.. 1 by mouth by mouth at bedtime 23)  Clobetasol Propionate 0.05 % Oint (Clobetasol propionate) .... Apply to affected area once daily  Patient Instructions: 1)  Please take all new medications as prescribed  - the gabapentin at 5 per day 2)  Continue all previous medications as before this visit, except stop the tramadol as you have since it was not helping 3)  Please keep your physcial appt at the [place] in november as you have planned, and  Dr Kimbrough/urology as planned 4)  Please schedule a follow-up appointment in 6 months, or sooner if needed Prescriptions: TAMSULOSIN HCL 0.4 MG CAPS (TAMSULOSIN HCL) 1 by mouth once daily  #90 x 3   Entered and Authorized by:   Corwin Levins MD   Signed by:   Corwin Levins MD on 05/14/2010   Method used:   Print then Give to Patient   RxID:   [id number] LISINOPRIL 40 MG  TABS (LISINOPRIL) 1 by mouth once daily  #90 x 3   Entered and Authorized by:   Corwin Levins MD   Signed by:   Corwin Levins MD on 05/14/2010   Method used:   Print then Give to Patient   RxID:   [id number] AMLODIPINE BESYLATE 5 MG TABS (AMLODIPINE BESYLATE) Take one tablet by mouth daily  #90 x 3   Entered and Authorized by:   Corwin Levins MD   Signed by:   Corwin Levins MD on 05/14/2010   Method used:   Print then Give to Patient   RxID:   [id number] HYDROCHLOROTHIAZIDE 25 MG TABS (HYDROCHLOROTHIAZIDE) 1/2 by mouth once daily  #135 x 3   Entered and Authorized by:   Corwin Levins MD   Signed by:   Corwin Levins MD on 05/14/2010   Method used:   Print then Give to Patient   RxID:   [id number] SIMVASTATIN 40 MG TABS (SIMVASTATIN) 1 1/2 at bedtime  #135 x 3   Entered and Authorized by:   Corwin Levins MD   Signed by:   Corwin Levins MD on 05/14/2010   Method used:   Print then Give to Patient   RxID:   [id number] RANITIDINE HCL 300 MG TABS (RANITIDINE HCL) 1 by mouth by mouth at bedtime  #90 x 3   Entered and Authorized by:   Corwin Levins MD   Signed by:   Corwin Levins MD on 05/14/2010   Method used:   Print then Give to Patient   RxID:   [id number] PRIMIDONE 250 MG  TABS (PRIMIDONE) take 1/2 by mouth three times a day  #135 x 3   Entered and Authorized by:   Corwin Levins MD   Signed by:   Corwin Levins MD on 05/14/2010   Method used:   Print then Give to Patient   RxID:   [id number] GABAPENTIN 300 MG  CAPS (GABAPENTIN) take 1 by mouth three times a day and 2 by mouth at bedtime  #450 x 1   Entered and Authorized by:   Corwin Levins MD   Signed by:   Corwin Levins MD on 05/14/2010   Method used:   Print then Give to Patient   RxID:   [id number]    Orders Added: 1)  Est. Patient Level IV [94854] 2)  Est. Patient Level IV [62703]

## 2010-08-15 NOTE — Progress Notes (Signed)
Summary: med substitution  Phone Note From Pharmacy   Caller: [facility] Summary of Call: need substitution for Propo-N/APAP 100-650 Initial call taken by: Scharlene Gloss,  July 26, 2009 11:54 AM  Follow-up for Phone Call        done hardcopy to [facility] side B - dahlia  - to change to tramadol as needed  Follow-up by: Corwin Levins MD,  July 26, 2009 1:13 PM  Additional Follow-up for Phone Call Additional follow up Details #1::        rx faxed to pharmacy Additional Follow-up by: Margaret Pyle, CMA,  July 26, 2009 1:19 PM    New/Updated Medications: TRAMADOL HCL 50 MG TABS (TRAMADOL HCL) 1 by mouth qid as needed pain Prescriptions: TRAMADOL HCL 50 MG TABS (TRAMADOL HCL) 1 by mouth qid as needed pain  #120 x 5   Entered and Authorized by:   Corwin Levins MD   Signed by:   Corwin Levins MD on 07/26/2009   Method used:   Print then Give to Patient   RxID:   [id number]

## 2010-08-15 NOTE — Assessment & Plan Note (Signed)
Summary: per check out/sf    Visit Type:  Follow-up Referring Provider:  n/a Primary Provider:  Oliver [name] ,MD   History of Present Illness: [name] returns today for followup.  He is a pleasant 75 yo man with a h/o chest pain, HTN and dysplipidemia.  He feels well.  He has been walking 3 miles a day and has done well.  He denies c/p, sob or peripheral edema.  His chest pain in the past had been thought due to a hiatal hernia.  It has been controlled.  No other symptoms.  Current Medications (verified): 1)  Tramadol Hcl 50 Mg Tabs (Tramadol Hcl) .Marland Kitchen.. 1 By Mouth Qid As Needed Pain 2)  Gabapentin 300 Mg  Caps (Gabapentin) .... Take 1 By Mouth Qid 3)  Fluticasone Propionate 50 Mcg/act  Susp (Fluticasone Propionate) .... Use 2 Spray Once Daily Each Side 4)  Primidone 250 Mg  Tabs (Primidone) .... Take 1/2 By Mouth Three Times A Day 5)  Lisinopril 40 Mg  Tabs (Lisinopril) .Marland Kitchen.. 1 By Mouth Once Daily 6)  Adult Aspirin Low Strength 81 Mg  Tbdp (Aspirin) .... Take 1 By Mouth Qd 7)  Travatan 0.004 %  Soln (Travoprost) .Marland Kitchen.. 1 Drops Both Eye Qhs 8)  Vitamin D3 1000 Unit  Tabs (Cholecalciferol) .Marland Kitchen.. 1 By Mouth Once Daily 9)  Multivitamins  Tabs (Multiple Vitamin) .Marland Kitchen.. 1 By Mouth Once Daily 10)  Vitamin C Cr 500 Mg Cr-Caps (Ascorbic Acid) .Marland Kitchen.. 1 By Mouth Once Daily 11)  Amlodipine Besylate 5 Mg Tabs (Amlodipine Besylate) .... Take One Tablet By Mouth Daily 12)  Simvastatin 40 Mg Tabs (Simvastatin) .Marland Kitchen.. 1 1/2 At Bedtime 13)  Zyrtec Allergy 10 Mg Tabs (Cetirizine Hcl) .Marland Kitchen.. 1 By Mouth Once Daily 14)  Nitroglycerin 0.4 Mg Subl (Nitroglycerin) .Marland Kitchen.. 1 Subl As Needed 15)  Calcium 600 600 Mg Tabs (Calcium Carbonate) .Marland Kitchen.. 1 By Mouth Once Daily 16)  Hydrochlorothiazide 25 Mg Tabs (Hydrochlorothiazide) .... 1/2 By Mouth Once Daily 17)  Meclizine Hcl 12.5 Mg Tabs (Meclizine Hcl) .Marland Kitchen.. 1 - 2 By Mouth Q 6 Hrs As Needed Dizziness/room Going Round 18)  Hyoscyamine Sulfate 0.125 Mg Tabs (Hyoscyamine Sulfate) ....  Take 1 Q 4 Hrs Prn 19)  Anusol-Hc 25 Mg  Supp (Hydrocortisone Acetate) .Marland Kitchen.. 1 Per Rectum Bid 20)  Anamantle Hc 3-0.5 %  Crea (Lidocaine-Hydrocortisone Ace) .... Apply To Rectal Area Bid 21)  Calamine  Lotn (Calamine) .... Apply Moderate Amount To Affected Area Three Times A Day As Needed 22)  Tamsulosin Hcl 0.4 Mg Caps (Tamsulosin Hcl) .Marland Kitchen.. 1 By Mouth Once Daily 23)  Omeprazole 20 Mg Cpdr (Omeprazole) .... 2 By Mouth Once Daily 24)  Ranitidine Hcl 300 Mg Tabs (Ranitidine Hcl) .Marland Kitchen.. 1 By Mouth By Mouth At Bedtime  Allergies: 1)  ! Sulfa  Past History:  Past Medical History: Last updated: 03/28/2009 Current Problems:  FATTY LIVER DISEASE (ICD-571.8) DIVERTICULOSIS OF COLON (ICD-562.10) GASTRITIS, CHRONIC (ICD-535.10) ESOPHAGITIS (ICD-530.10) ABDOMINAL TENDERNESS, EPIGASTRIC (ICD-789.66) BRADYCARDIA (ICD-427.89) CHEST PAIN (ICD-786.50) DIZZINESS (ICD-780.4) HEPATOTOXICITY, DRUG-INDUCED, RISK OF (ICD-V58.69) BLADDER CANCER (ICD-188.9) SPECIAL SCREENING MALIGNANT NEOPLASM OF PROSTATE (ICD-V76.44) COUGH (ICD-786.2) COLONIC POLYPS, HX OF (ICD-V12.72) ALLERGIC RHINITIS (ICD-477.9) RENAL INSUFFICIENCY (ICD-588.9) ADENOIDECTOMY, HX OF (ICD-V45.79) CORONARY ARTERY DISEASE (ICD-414.00) CONGESTIVE HEART FAILURE (ICD-428.0) DISC DISEASE, LUMBAR (ICD-722.52) PEPTIC ULCER DISEASE (ICD-533.90) HIATAL HERNIA WITH REFLUX (ICD-553.3) RHEUMATOID ARTHRITIS (ICD-714.0) HYPERTENSION (ICD-401.9) HYPERLIPIDEMIA (ICD-272.4) GERD (ICD-530.81)  Past Surgical History: Last updated: 04/03/2009 Percutaneous transluminal coronary angioplasty Tonsillectomy Stress Cardiolite ( 11/06/2006) 2-D Echo (11/02/2006) Upper Endoscopy (  03/01/2007) s/p left hip arthroplasty 2002 s/p bladder surgury s/p lumbar surgury 1/08  Transurethral resection of bladder cancer, Dr. Aldean Ast. Cataract Extraction  Review of Systems  The patient denies chest pain, syncope, dyspnea on exertion, and peripheral edema.     Vital Signs:  Patient profile:   75 year old male Height:      67 inches Weight:      183 pounds BMI:     28.77 Pulse rate:   66 / minute BP sitting:   100 / 60  (left arm)  Vitals Entered By: Laurance Flatten CMA (April 25, 2010 11:00 AM)  Physical Exam  General:  alert and overweight-appearing.   Head:  normocephalic and atraumatic.   Eyes:  vision grossly intact, pupils equal, and pupils round.   Mouth:  no gingival abnormalities and pharynx pink and moist.   Neck:  supple and no masses.   Lungs:  normal respiratory effort, R decreased breath sounds, and L decreased breath sounds.   Heart:  normal rate and regular rhythm.   Abdomen:  Soft, nontender and nondistended. No masses, hepatosplenomegaly or hernias noted. Normal bowel sounds.obese.   Msk:  right first MTP with mild bunion swelling and tenderness, without actual MTP sweling, tender or erythema;  right foot o/w neurovasc intact.  Some right great toe chronic lateral deviation noted mild Pulses:  Normal pulses noted. Extremities:  no edema, no erythema  Neurologic:  Alert and  oriented x4;  grossly normal neurologically.   EKG  Procedure date:  04/25/2010  Findings:      Normal sinus rhythm with rate of: 66.   Impression & Recommendations:  Problem # 1:  CHEST PAIN (ICD-786.50) His symptoms remain well controlled. He will continue his current meds. The following medications were removed from the medication list:    Amlodipine Besylate 5 Mg Tabs (Amlodipine besylate) .Marland Kitchen... Take one tablet by mouth daily His updated medication list for this problem includes:    Lisinopril 40 Mg Tabs (Lisinopril) .Marland Kitchen... 1 by mouth once daily    Adult Aspirin Low Strength 81 Mg Tbdp (Aspirin) .Marland Kitchen... Take 1 by mouth qd    Amlodipine Besylate 5 Mg Tabs (Amlodipine besylate) .Marland Kitchen... Take one tablet by mouth daily    Nitroglycerin 0.4 Mg Subl (Nitroglycerin) .Marland Kitchen... 1 subl as needed  Problem # 2:  HYPERTENSION (ICD-401.9) His blood pressure  is well controlled.  Continue exercise, amlodipine and a low sodium diet. The following medications were removed from the medication list:    Amlodipine Besylate 5 Mg Tabs (Amlodipine besylate) .Marland Kitchen... Take one tablet by mouth daily His updated medication list for this problem includes:    Lisinopril 40 Mg Tabs (Lisinopril) .Marland Kitchen... 1 by mouth once daily    Adult Aspirin Low Strength 81 Mg Tbdp (Aspirin) .Marland Kitchen... Take 1 by mouth qd    Amlodipine Besylate 5 Mg Tabs (Amlodipine besylate) .Marland Kitchen... Take one tablet by mouth daily    Hydrochlorothiazide 25 Mg Tabs (Hydrochlorothiazide) .Marland Kitchen... 1/2 by mouth once daily  Problem # 3:  HYPERLIPIDEMIA (ICD-272.4) He will continue simvastatin and a low fat diet. His updated medication list for this problem includes:    Simvastatin 40 Mg Tabs (Simvastatin) .Marland Kitchen... 1 1/2 at bedtime  Other Orders: EKG w/ Interpretation (93000)

## 2010-08-15 NOTE — Miscellaneous (Signed)
Summary: Orders Update  Clinical Lists Changes  Orders: Added new Test order of Renal Artery Duplex (Renal Artery Duplex) - Signed 

## 2010-08-15 NOTE — Progress Notes (Signed)
Summary: pt need pain meds  Phone Note Call from Patient   Summary of Call: Patient stated MD was planning to give him pain meds with codeine and forgot. Please Advise. Initial call taken by: Daphane Shepherd,  May 14, 2010 4:08 PM  Follow-up for Phone Call        done hardcopy to LIM side B - dahlia  Follow-up by: Corwin Levins MD,  May 24, 2010 12:56 PM    New/Updated Medications: TYLENOL WITH CODEINE #3 300-30 MG TABS (ACETAMINOPHEN-CODEINE) 1 by mouth two times a day as needed Prescriptions: TYLENOL WITH CODEINE #3 300-30 MG TABS (ACETAMINOPHEN-CODEINE) 1 by mouth two times a day as needed  #60 x 2   Entered and Authorized by:   Corwin Levins MD   Signed by:   Corwin Levins MD on 05/24/2010   Method used:   Print then Give to Patient   RxID:   [id number]  done hardcopy to LIM side B - dahlia Corwin Levins MD  May 24, 2010 12:56 PM   Rx faxed to pharmacy per pt request. [facility] in [facility] [place], [place]  May 24, 2010 2:13 PM

## 2010-08-15 NOTE — Assessment & Plan Note (Signed)
Summary: ?arthritis pain/#/cd   Vital Signs:  Patient profile:   75 year old male Height:      67 inches Weight:      193.50 pounds BMI:     30.42 O2 Sat:      95 % on Room air Temp:     98.1 degrees F oral Pulse rate:   54 / minute BP sitting:   110 / 62  (left arm) Cuff size:   regular  Vitals Entered By: Zella Ball Ewing CMA Duncan Dull) (February 05, 2010 11:28 AM)  O2 Flow:  Room air  CC: Right big toe pain, arthritis/RE   Primary Care Provider:  Oliver Barre ,MD  CC:  Right big toe pain and arthritis/RE.  History of Present Illness: overall ok, but tries to walk 1 mile per day, and today with increased pain and slight swelling to the right first MTP area for the past wk so that he cannot walk as much.  No erythema, trauma, fever, or gout symtpoms but has hx of bunion that has acted up some in the past, though he has not had to see podiatry.  No other complaints today.  Pt denies CP, sob, doe, wheezing, orthopnea, pnd, worsening LE edema, palps, dizziness or syncope  Pt denies new neuro symptoms such as headache, facial or extremity weakness   also sees the [place] for labs and PSA  Problems Prior to Update: 1)  Bunion, Right Foot  (ICD-727.1) 2)  Rash and Other Nonspecific Skin Eruption  (ICD-782.1) 3)  Diarrhea  (ICD-787.91) 4)  Rectal Bleeding  (ICD-569.3) 5)  Diarrhea  (ICD-787.91) 6)  Fatty Liver Disease  (ICD-571.8) 7)  Diverticulosis of Colon  (ICD-562.10) 8)  Bradycardia  (ICD-427.89) 9)  Chest Pain  (ICD-786.50) 10)  Dizziness  (ICD-780.4) 11)  Hepatotoxicity, Drug-induced, Risk of  (ICD-V58.69) 12)  Bladder Cancer  (ICD-188.9) 13)  Special Screening Malignant Neoplasm of Prostate  (ICD-V76.44) 14)  Cough  (ICD-786.2) 15)  Colonic Polyps, Hx of  (ICD-V12.72) 16)  Allergic Rhinitis  (ICD-477.9) 17)  Renal Insufficiency  (ICD-588.9) 18)  Adenoidectomy, Hx of  (ICD-V45.79) 19)  Disc Disease, Lumbar  (ICD-722.52) 20)  Hiatal Hernia With Reflux  (ICD-553.3) 21)  Rheumatoid  Arthritis  (ICD-714.0) 22)  Hypertension  (ICD-401.9) 23)  Hyperlipidemia  (ICD-272.4) 24)  Gerd  (ICD-530.81)  Medications Prior to Update: 1)  Tramadol Hcl 50 Mg Tabs (Tramadol Hcl) .Marland Kitchen.. 1 By Mouth Qid As Needed Pain 2)  Gabapentin 300 Mg  Caps (Gabapentin) .... Take 1 By Mouth Qid 3)  Fluticasone Propionate 50 Mcg/act  Susp (Fluticasone Propionate) .... Use 2 Spray Once Daily Each Side 4)  Primidone 250 Mg  Tabs (Primidone) .... Take 1/2 By Mouth Three Times A Day 5)  Lisinopril 40 Mg  Tabs (Lisinopril) .Marland Kitchen.. 1 By Mouth Once Daily 6)  Adult Aspirin Low Strength 81 Mg  Tbdp (Aspirin) .... Take 1 By Mouth Qd 7)  Travatan 0.004 %  Soln (Travoprost) .Marland Kitchen.. 1 Drops Both Eye Qhs 8)  Vitamin D3 1000 Unit  Tabs (Cholecalciferol) .Marland Kitchen.. 1 By Mouth Once Daily 9)  Multivitamins  Tabs (Multiple Vitamin) .Marland Kitchen.. 1 By Mouth Once Daily 10)  Vitamin C Cr 500 Mg Cr-Caps (Ascorbic Acid) .Marland Kitchen.. 1 By Mouth Once Daily 11)  Amlodipine Besylate 5 Mg Tabs (Amlodipine Besylate) .... Take One Tablet By Mouth Daily 12)  Simvastatin 40 Mg Tabs (Simvastatin) .Marland Kitchen.. 1 1/2 At Bedtime 13)  Zyrtec Allergy 10 Mg Tabs (Cetirizine Hcl) .Marland Kitchen.. 1 By Mouth  Once Daily 14)  Nitroglycerin 0.4 Mg Subl (Nitroglycerin) .Marland Kitchen.. 1 Subl As Needed 15)  Calcium 600 600 Mg Tabs (Calcium Carbonate) .Marland Kitchen.. 1 By Mouth Once Daily 16)  Hydrochlorothiazide 25 Mg Tabs (Hydrochlorothiazide) .... 1/2 By Mouth Once Daily 17)  Meclizine Hcl 12.5 Mg Tabs (Meclizine Hcl) .Marland Kitchen.. 1 - 2 By Mouth Q 6 Hrs As Needed Dizziness/room Going Round 18)  Hyoscyamine Sulfate 0.125 Mg Tabs (Hyoscyamine Sulfate) .... Take 1 Q 4 Hrs Prn 19)  Gi Cocktail, Equal Parts Maalox, Donnatal, Xylocaine .Marland Kitchen.. 1 Tbsp Q 12 Hrs As Needed Chest Pain 20)  Amlodipine Besylate 5 Mg Tabs (Amlodipine Besylate) .... Take One Tablet By Mouth Daily 21)  Anusol-Hc 25 Mg  Supp (Hydrocortisone Acetate) .Marland Kitchen.. 1 Per Rectum Bid 22)  Anamantle Hc 3-0.5 %  Crea (Lidocaine-Hydrocortisone Ace) .... Apply To Rectal Area  Bid 23)  Nystatin 100000 Unit/gm Crea (Nystatin) .... Apply To Rectal Area 3-4 X Daily 24)  Pantoprazole Sodium 40 Mg Tbec (Pantoprazole Sodium) .Marland Kitchen.. 1 By Mouth Once Daily 25)  Calamine  Lotn (Calamine) .... Apply Moderate Amount To Affected Area Three Times A Day As Needed 26)  Tamsulosin Hcl 0.4 Mg Caps (Tamsulosin Hcl) .Marland Kitchen.. 1 By Mouth Once Daily 27)  Omeprazole 20 Mg Cpdr (Omeprazole) .... 2 By Mouth Once Daily 28)  Ranitidine Hcl 300 Mg Tabs (Ranitidine Hcl) .Marland Kitchen.. 1 By Mouth By Mouth At Bedtime 29)  Azithromycin 250 Mg Tabs (Azithromycin) .... 2po Qd For 1 Day, Then 1po Qd For 4days, Then Stop 30)  Hydrocodone-Homatropine 5-1.5 Mg/54ml Syrp (Hydrocodone-Homatropine) .Marland Kitchen.. 1 Tsp By Mouth Q 6 Hrs As Needed Cough  Current Medications (verified): 1)  Tramadol Hcl 50 Mg Tabs (Tramadol Hcl) .Marland Kitchen.. 1 By Mouth Qid As Needed Pain 2)  Gabapentin 300 Mg  Caps (Gabapentin) .... Take 1 By Mouth Qid 3)  Fluticasone Propionate 50 Mcg/act  Susp (Fluticasone Propionate) .... Use 2 Spray Once Daily Each Side 4)  Primidone 250 Mg  Tabs (Primidone) .... Take 1/2 By Mouth Three Times A Day 5)  Lisinopril 40 Mg  Tabs (Lisinopril) .Marland Kitchen.. 1 By Mouth Once Daily 6)  Adult Aspirin Low Strength 81 Mg  Tbdp (Aspirin) .... Take 1 By Mouth Qd 7)  Travatan 0.004 %  Soln (Travoprost) .Marland Kitchen.. 1 Drops Both Eye Qhs 8)  Vitamin D3 1000 Unit  Tabs (Cholecalciferol) .Marland Kitchen.. 1 By Mouth Once Daily 9)  Multivitamins  Tabs (Multiple Vitamin) .Marland Kitchen.. 1 By Mouth Once Daily 10)  Vitamin C Cr 500 Mg Cr-Caps (Ascorbic Acid) .Marland Kitchen.. 1 By Mouth Once Daily 11)  Amlodipine Besylate 5 Mg Tabs (Amlodipine Besylate) .... Take One Tablet By Mouth Daily 12)  Simvastatin 40 Mg Tabs (Simvastatin) .Marland Kitchen.. 1 1/2 At Bedtime 13)  Zyrtec Allergy 10 Mg Tabs (Cetirizine Hcl) .Marland Kitchen.. 1 By Mouth Once Daily 14)  Nitroglycerin 0.4 Mg Subl (Nitroglycerin) .Marland Kitchen.. 1 Subl As Needed 15)  Calcium 600 600 Mg Tabs (Calcium Carbonate) .Marland Kitchen.. 1 By Mouth Once Daily 16)  Hydrochlorothiazide 25  Mg Tabs (Hydrochlorothiazide) .... 1/2 By Mouth Once Daily 17)  Meclizine Hcl 12.5 Mg Tabs (Meclizine Hcl) .Marland Kitchen.. 1 - 2 By Mouth Q 6 Hrs As Needed Dizziness/room Going Round 18)  Hyoscyamine Sulfate 0.125 Mg Tabs (Hyoscyamine Sulfate) .... Take 1 Q 4 Hrs Prn 19)  Amlodipine Besylate 5 Mg Tabs (Amlodipine Besylate) .... Take One Tablet By Mouth Daily 20)  Anusol-Hc 25 Mg  Supp (Hydrocortisone Acetate) .Marland Kitchen.. 1 Per Rectum Bid 21)  Anamantle Hc 3-0.5 %  Crea (Lidocaine-Hydrocortisone  Ace) .... Apply To Rectal Area Bid 22)  Nystatin 100000 Unit/gm Crea (Nystatin) .... Apply To Rectal Area 3-4 X Daily 23)  Pantoprazole Sodium 40 Mg Tbec (Pantoprazole Sodium) .Marland Kitchen.. 1 By Mouth Once Daily 24)  Calamine  Lotn (Calamine) .... Apply Moderate Amount To Affected Area Three Times A Day As Needed 25)  Tamsulosin Hcl 0.4 Mg Caps (Tamsulosin Hcl) .Marland Kitchen.. 1 By Mouth Once Daily 26)  Omeprazole 20 Mg Cpdr (Omeprazole) .... 2 By Mouth Once Daily 27)  Ranitidine Hcl 300 Mg Tabs (Ranitidine Hcl) .Marland Kitchen.. 1 By Mouth By Mouth At Bedtime 28)  Azithromycin 250 Mg Tabs (Azithromycin) .... 2po Qd For 1 Day, Then 1po Qd For 4days, Then Stop 29)  Hydrocodone-Homatropine 5-1.5 Mg/28ml Syrp (Hydrocodone-Homatropine) .Marland Kitchen.. 1 Tsp By Mouth Q 6 Hrs As Needed Cough 30)  Voltaren 1 % Gel (Diclofenac Sodium) .... Use Asd Two Times A Day As Needed  Allergies (verified): 1)  ! Sulfa  Past History:  Past Medical History: Last updated: 03/28/2009 Current Problems:  FATTY LIVER DISEASE (ICD-571.8) DIVERTICULOSIS OF COLON (ICD-562.10) GASTRITIS, CHRONIC (ICD-535.10) ESOPHAGITIS (ICD-530.10) ABDOMINAL TENDERNESS, EPIGASTRIC (ICD-789.66) BRADYCARDIA (ICD-427.89) CHEST PAIN (ICD-786.50) DIZZINESS (ICD-780.4) HEPATOTOXICITY, DRUG-INDUCED, RISK OF (ICD-V58.69) BLADDER CANCER (ICD-188.9) SPECIAL SCREENING MALIGNANT NEOPLASM OF PROSTATE (ICD-V76.44) COUGH (ICD-786.2) COLONIC POLYPS, HX OF (ICD-V12.72) ALLERGIC RHINITIS (ICD-477.9) RENAL  INSUFFICIENCY (ICD-588.9) ADENOIDECTOMY, HX OF (ICD-V45.79) CORONARY ARTERY DISEASE (ICD-414.00) CONGESTIVE HEART FAILURE (ICD-428.0) DISC DISEASE, LUMBAR (ICD-722.52) PEPTIC ULCER DISEASE (ICD-533.90) HIATAL HERNIA WITH REFLUX (ICD-553.3) RHEUMATOID ARTHRITIS (ICD-714.0) HYPERTENSION (ICD-401.9) HYPERLIPIDEMIA (ICD-272.4) GERD (ICD-530.81)  Past Surgical History: Last updated: 04/03/2009 Percutaneous transluminal coronary angioplasty Tonsillectomy Stress Cardiolite ( 11/06/2006) 2-D Echo (11/02/2006) Upper Endoscopy ( 03/01/2007) s/p left hip arthroplasty 2002 s/p bladder surgury s/p lumbar surgury 1/08  Transurethral resection of bladder cancer, Dr. Aldean Ast. 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   Head:  normocephalic and atraumatic.   Eyes:  vision grossly intact, pupils equal, and pupils round.   Ears:  R ear normal and L ear normal.   Nose:  no external deformity and no nasal discharge.   Mouth:  no gingival abnormalities and pharynx pink and moist.   Neck:  supple and no masses.   Lungs:  normal respiratory effort, R decreased breath sounds, and L decreased breath sounds.   Heart:  normal rate and regular rhythm.   Msk:  right first MTP with mild bunion swelling and tenderness, without actual MTP sweling, tender or erythema;  right foot o/w neurovasc intact.  Some right great toe chronic lateral deviation noted mild Extremities:  no edema, no erythema    Impression & Recommendations:  Problem # 1:  BUNION, RIGHT FOOT (ICD-727.1) pt is non oral nsaid candidate ; will try voltaren gel as needed,  refer podiatry if not improved  Problem # 2:  HYPERTENSION (ICD-401.9)  His updated medication list  for this problem includes:    Lisinopril 40 Mg Tabs (Lisinopril) .Marland Kitchen... 1 by mouth once daily    Amlodipine Besylate 5 Mg Tabs (Amlodipine besylate) .Marland Kitchen... Take one tablet by mouth daily    Hydrochlorothiazide 25 Mg Tabs (Hydrochlorothiazide) .Marland Kitchen... 1/2 by mouth once daily  BP today: 110/62 Prior BP: 136/76 (11/27/2009)  Labs Reviewed: K+: 3.9 (10/17/2008) Creat: : 1.0 (  10/17/2008)   Chol: 138 (02/07/2008)   HDL: 30.5 (02/07/2008)   LDL: 89 (02/07/2008)   TG: 92 (02/07/2008) stable overall by hx and exam, ok to continue meds/tx as is   Complete Medication List: 1)  Tramadol Hcl 50 Mg Tabs (Tramadol hcl) .Marland Kitchen.. 1 by mouth qid as needed pain 2)  Gabapentin 300 Mg Caps (Gabapentin) .... Take 1 by mouth qid 3)  Fluticasone Propionate 50 Mcg/act Susp (Fluticasone propionate) .... Use 2 spray once daily each side 4)  Primidone 250 Mg Tabs (Primidone) .... Take 1/2 by mouth three times a day 5)  Lisinopril 40 Mg Tabs (Lisinopril) .Marland Kitchen.. 1 by mouth once daily 6)  Adult Aspirin Low Strength 81 Mg Tbdp (Aspirin) .... Take 1 by mouth qd 7)  Travatan 0.004 % Soln (Travoprost) .Marland Kitchen.. 1 drops both eye qhs 8)  Vitamin D3 1000 Unit Tabs (Cholecalciferol) .Marland Kitchen.. 1 by mouth once daily 9)  Multivitamins Tabs (Multiple vitamin) .Marland Kitchen.. 1 by mouth once daily 10)  Vitamin C Cr 500 Mg Cr-caps (Ascorbic acid) .Marland Kitchen.. 1 by mouth once daily 11)  Amlodipine Besylate 5 Mg Tabs (Amlodipine besylate) .... Take one tablet by mouth daily 12)  Simvastatin 40 Mg Tabs (Simvastatin) .Marland Kitchen.. 1 1/2 at bedtime 13)  Zyrtec Allergy 10 Mg Tabs (Cetirizine hcl) .Marland Kitchen.. 1 by mouth once daily 14)  Nitroglycerin 0.4 Mg Subl (Nitroglycerin) .Marland Kitchen.. 1 subl as needed 15)  Calcium 600 600 Mg Tabs (Calcium carbonate) .Marland Kitchen.. 1 by mouth once daily 16)  Hydrochlorothiazide 25 Mg Tabs (Hydrochlorothiazide) .... 1/2 by mouth once daily 17)  Meclizine Hcl 12.5 Mg Tabs (Meclizine hcl) .Marland Kitchen.. 1 - 2 by mouth q 6 hrs as needed dizziness/room going round 18)  Hyoscyamine  Sulfate 0.125 Mg Tabs (Hyoscyamine sulfate) .... Take 1 q 4 hrs prn 19)  Amlodipine Besylate 5 Mg Tabs (Amlodipine besylate) .... Take one tablet by mouth daily 20)  Anusol-hc 25 Mg Supp (Hydrocortisone acetate) .Marland Kitchen.. 1 per rectum bid 21)  Anamantle Hc 3-0.5 % Crea (Lidocaine-hydrocortisone ace) .... Apply to rectal area bid 22)  Nystatin 100000 Unit/gm Crea (Nystatin) .... Apply to rectal area 3-4 x daily 23)  Pantoprazole Sodium 40 Mg Tbec (Pantoprazole sodium) .Marland Kitchen.. 1 by mouth once daily 24)  Calamine Lotn (Calamine) .... Apply moderate amount to affected area three times a day as needed 25)  Tamsulosin Hcl 0.4 Mg Caps (Tamsulosin hcl) .Marland Kitchen.. 1 by mouth once daily 26)  Omeprazole 20 Mg Cpdr (Omeprazole) .... 2 by mouth once daily 27)  Ranitidine Hcl 300 Mg Tabs (Ranitidine hcl) .Marland Kitchen.. 1 by mouth by mouth at bedtime 28)  Azithromycin 250 Mg Tabs (Azithromycin) .... 2po qd for 1 day, then 1po qd for 4days, then stop 29)  Hydrocodone-homatropine 5-1.5 Mg/25ml Syrp (Hydrocodone-homatropine) .Marland Kitchen.. 1 tsp by mouth q 6 hrs as needed cough 30)  Voltaren 1 % Gel (Diclofenac sodium) .... Use asd two times a day as needed  Patient Instructions: 1)  Please take all new medications as prescribed 2)  Continue all previous medications as before this visit  3)  please call for referral to podiatry if the gel does not seem to help 4)  Please schedule a follow-up appointment in 1 year or sooner if needed Prescriptions: VOLTAREN 1 % GEL (DICLOFENAC SODIUM) use asd two times a day as needed  #1 x 5   Entered and Authorized by:   Corwin Levins MD   Signed by:   Corwin Levins MD on 02/05/2010   Method used:  Print then Give to Patient   RxID:   [id number]

## 2010-08-15 NOTE — Progress Notes (Signed)
Summary: update   Phone Note Call from Patient Call back at Home Phone [phone]   Caller: Patient Call For: Dr. Jarold Motto Reason for Call: Talk to Nurse Summary of Call: hemorrhoids are better, but pt states his rash has worsened Initial call taken by: Vallarie Mare,  August 09, 2009 10:53 AM  Follow-up for Phone Call        Pt states rash is getting worse.  Rectal area red and angry looking.  Burns and stings.  Weeping clear drainage.  Underwear will get wet and stick to affected area.  Pt is still using nystatein cream but it is not helping.  Pt using suppositiries and hemorrhoids are better.   Follow-up by: Ashok Cordia RN,  August 09, 2009 11:19 AM  Additional Follow-up for Phone Call Additional follow up Details #1::        Please refer him to [facility] dermatology for exam. Stop nystatin and use local Desitin cream for now Additional Follow-up by: Mardella Layman MD FACG,  August 09, 2009 12:38 PM  New Problems: RASH AND OTHER NONSPECIFIC SKIN ERUPTION (ICD-782.1)   Additional Follow-up for Phone Call Additional follow up Details #2::    Pt notified.  Lupita Leash Surface RN  August 09, 2009 2:53 PM  Appt sch with Dr. Yetta Barre at [facility] dermatology for September 27, 2009 at 2:20. Pt notified. of appt time. Follow-up by: Ashok Cordia RN,  August 10, 2009 9:40 AM  New Problems: RASH AND OTHER NONSPECIFIC SKIN ERUPTION (ICD-782.1)

## 2010-08-15 NOTE — Progress Notes (Signed)
----   Converted from flag ---- ---- 09/17/2009 11:34 AM, Edman Circle wrote: APPT 3/21 @ 9:30/ NPO CLEAR LIQUIDS ARE OK, AVOID FOODS THAT PRODUCE GAS. NO BREAKFAST, SMOKING OR GUM. DRONK AT LEAST 8 OZ OF WATER HOUR BEFORE TEST  ---- 09/17/2009 8:24 AM, Dagoberto Reef wrote: Thanks  ---- 09/15/2009 5:36 PM, Corwin Levins MD wrote: The following orders have been entered for this patient and placed on Admin Hold:  Type:     Referral       Code:   Radiology Description:   Radiology Referral Order Date:   09/15/2009   Authorized By:   Corwin Levins MD Order #:   [id number] Clinical Notes:   Name of Test or Procedure: renal ARTERY u/s  Of What:  Special Instructions ------------------------------

## 2010-08-15 NOTE — Assessment & Plan Note (Signed)
Summary: SORE THROAT,COLD/CD   Vital Signs:  Patient profile:   75 year old male Height:      67 inches Weight:      199 pounds BMI:     31.28 O2 Sat:      95 % on Room air Temp:     97.9 degrees F oral Pulse rate:   64 / minute BP sitting:   136 / 76  (left arm) Cuff size:   regular  Vitals Entered ByZella Ball Ewing (Nov 27, 2009 11:43 AM)  O2 Flow:  Room air  CC: Sore throat, nasal drainage, cough, prescription refill/RE   Primary Care Provider:  Oliver Barre ,MD  CC:  Sore throat, nasal drainage, cough, and prescription refill/RE.  History of Present Illness: here wtih 2 to 3 days onset severe ST, with fever, mild nonprod cough assoc with back and mid upper and left chest pains, sharp without radiatoin, sob, n/v, diphaoresis, palp or syncope, or wheezing.  He ha had 2 brother die in last 2 yrs with lung cancer.  He quit smoking remotely about 1970.  Pt denies other CP, sob, doe, wheezing, orthopnea, pnd, worsening LE edema, palps, dizziness or syncope .  Pt denies new neuro symptoms such as headache, facial or extremity weakness  CP is nonpleuritic, nonexertional  Problems Prior to Update: 1)  Bronchitis-acute  (ICD-466.0) 2)  Rash and Other Nonspecific Skin Eruption  (ICD-782.1) 3)  Diarrhea  (ICD-787.91) 4)  Rectal Bleeding  (ICD-569.3) 5)  Diarrhea  (ICD-787.91) 6)  Fatty Liver Disease  (ICD-571.8) 7)  Diverticulosis of Colon  (ICD-562.10) 8)  Bradycardia  (ICD-427.89) 9)  Chest Pain  (ICD-786.50) 10)  Dizziness  (ICD-780.4) 11)  Hepatotoxicity, Drug-induced, Risk of  (ICD-V58.69) 12)  Bladder Cancer  (ICD-188.9) 13)  Special Screening Malignant Neoplasm of Prostate  (ICD-V76.44) 14)  Cough  (ICD-786.2) 15)  Colonic Polyps, Hx of  (ICD-V12.72) 16)  Allergic Rhinitis  (ICD-477.9) 17)  Renal Insufficiency  (ICD-588.9) 18)  Adenoidectomy, Hx of  (ICD-V45.79) 19)  Disc Disease, Lumbar  (ICD-722.52) 20)  Hiatal Hernia With Reflux  (ICD-553.3) 21)  Rheumatoid Arthritis   (ICD-714.0) 22)  Hypertension  (ICD-401.9) 23)  Hyperlipidemia  (ICD-272.4) 24)  Gerd  (ICD-530.81)  Medications Prior to Update: 1)  Tramadol Hcl 50 Mg Tabs (Tramadol Hcl) .Marland Kitchen.. 1 By Mouth Qid As Needed Pain 2)  Gabapentin 300 Mg  Caps (Gabapentin) .... Take 1 By Mouth Three Times A Day Qd 3)  Fluticasone Propionate 50 Mcg/act  Susp (Fluticasone Propionate) .... Use 2 Spray Once Daily Each Side 4)  Primidone 250 Mg  Tabs (Primidone) .... Take 1/2 By Mouth Three Times A Day 5)  Lisinopril 40 Mg  Tabs (Lisinopril) .Marland Kitchen.. 1 By Mouth Once Daily 6)  Adult Aspirin Low Strength 81 Mg  Tbdp (Aspirin) .... Take 1 By Mouth Qd 7)  Travatan 0.004 %  Soln (Travoprost) .Marland Kitchen.. 1 Drops Both Eye Qhs 8)  Vitamin D3 1000 Unit  Tabs (Cholecalciferol) .Marland Kitchen.. 1 By Mouth Once Daily 9)  Multivitamins  Tabs (Multiple Vitamin) .Marland Kitchen.. 1 By Mouth Once Daily 10)  Vitamin C Cr 500 Mg Cr-Caps (Ascorbic Acid) .Marland Kitchen.. 1 By Mouth Once Daily 11)  Amlodipine Besylate 5 Mg Tabs (Amlodipine Besylate) .... Take One Tablet By Mouth Daily 12)  Simvastatin 40 Mg Tabs (Simvastatin) .Marland Kitchen.. 1 1/2 At Bedtime 13)  Zyrtec Allergy 10 Mg Tabs (Cetirizine Hcl) .Marland Kitchen.. 1 By Mouth Once Daily 14)  Nitroglycerin 0.4 Mg Subl (Nitroglycerin) .Marland Kitchen.. 1 Subl  As Needed 15)  Calcium 600 600 Mg Tabs (Calcium Carbonate) .Marland Kitchen.. 1 By Mouth Once Daily 16)  Hydrochlorothiazide 25 Mg Tabs (Hydrochlorothiazide) .... 1/2 By Mouth Once Daily 17)  Meclizine Hcl 12.5 Mg Tabs (Meclizine Hcl) .Marland Kitchen.. 1 - 2 By Mouth Q 6 Hrs As Needed Dizziness/room Going Round 18)  Hyoscyamine Sulfate 0.125 Mg Tabs (Hyoscyamine Sulfate) .... Take 1 Q 4 Hrs Prn 19)  Gi Cocktail, Equal Parts Maalox, Donnatal, Xylocaine .Marland Kitchen.. 1 Tbsp Q 12 Hrs As Needed Chest Pain 20)  Amlodipine Besylate 5 Mg Tabs (Amlodipine Besylate) .... Take One Tablet By Mouth Daily 21)  Dexilant 60 Mg Cpdr (Dexlansoprazole) .Marland Kitchen.. 1 By Mouth Once Daily 22)  Anusol-Hc 25 Mg  Supp (Hydrocortisone Acetate) .Marland Kitchen.. 1 Per Rectum Bid 23)   Anamantle Hc 3-0.5 %  Crea (Lidocaine-Hydrocortisone Ace) .... Apply To Rectal Area Bid 24)  Nystatin 100000 Unit/gm Crea (Nystatin) .... Apply To Rectal Area 3-4 X Daily 25)  Pantoprazole Sodium 40 Mg Tbec (Pantoprazole Sodium) .Marland Kitchen.. 1 By Mouth Once Daily 26)  Calamine  Lotn (Calamine) .... Apply Moderate Amount To Affected Area Three Times A Day As Needed 27)  Tamsulosin Hcl 0.4 Mg Caps (Tamsulosin Hcl) .Marland Kitchen.. 1 By Mouth Once Daily 28)  Omeprazole 20 Mg Cpdr (Omeprazole) .... 2 By Mouth Once Daily 29)  Ranitidine Hcl 300 Mg Tabs (Ranitidine Hcl) .Marland Kitchen.. 1 By Mouth By Mouth At Bedtime 30)  Propoxyphene N-Apap 100-650 Mg Tabs (Propoxyphene N-Apap) .Marland Kitchen.. 1 By Mouth Twice A Day As Needed  Current Medications (verified): 1)  Tramadol Hcl 50 Mg Tabs (Tramadol Hcl) .Marland Kitchen.. 1 By Mouth Qid As Needed Pain 2)  Gabapentin 300 Mg  Caps (Gabapentin) .... Take 1 By Mouth Qid 3)  Fluticasone Propionate 50 Mcg/act  Susp (Fluticasone Propionate) .... Use 2 Spray Once Daily Each Side 4)  Primidone 250 Mg  Tabs (Primidone) .... Take 1/2 By Mouth Three Times A Day 5)  Lisinopril 40 Mg  Tabs (Lisinopril) .Marland Kitchen.. 1 By Mouth Once Daily 6)  Adult Aspirin Low Strength 81 Mg  Tbdp (Aspirin) .... Take 1 By Mouth Qd 7)  Travatan 0.004 %  Soln (Travoprost) .Marland Kitchen.. 1 Drops Both Eye Qhs 8)  Vitamin D3 1000 Unit  Tabs (Cholecalciferol) .Marland Kitchen.. 1 By Mouth Once Daily 9)  Multivitamins  Tabs (Multiple Vitamin) .Marland Kitchen.. 1 By Mouth Once Daily 10)  Vitamin C Cr 500 Mg Cr-Caps (Ascorbic Acid) .Marland Kitchen.. 1 By Mouth Once Daily 11)  Amlodipine Besylate 5 Mg Tabs (Amlodipine Besylate) .... Take One Tablet By Mouth Daily 12)  Simvastatin 40 Mg Tabs (Simvastatin) .Marland Kitchen.. 1 1/2 At Bedtime 13)  Zyrtec Allergy 10 Mg Tabs (Cetirizine Hcl) .Marland Kitchen.. 1 By Mouth Once Daily 14)  Nitroglycerin 0.4 Mg Subl (Nitroglycerin) .Marland Kitchen.. 1 Subl As Needed 15)  Calcium 600 600 Mg Tabs (Calcium Carbonate) .Marland Kitchen.. 1 By Mouth Once Daily 16)  Hydrochlorothiazide 25 Mg Tabs (Hydrochlorothiazide) ....  1/2 By Mouth Once Daily 17)  Meclizine Hcl 12.5 Mg Tabs (Meclizine Hcl) .Marland Kitchen.. 1 - 2 By Mouth Q 6 Hrs As Needed Dizziness/room Going Round 18)  Hyoscyamine Sulfate 0.125 Mg Tabs (Hyoscyamine Sulfate) .... Take 1 Q 4 Hrs Prn 19)  Gi Cocktail, Equal Parts Maalox, Donnatal, Xylocaine .Marland Kitchen.. 1 Tbsp Q 12 Hrs As Needed Chest Pain 20)  Amlodipine Besylate 5 Mg Tabs (Amlodipine Besylate) .... Take One Tablet By Mouth Daily 21)  Anusol-Hc 25 Mg  Supp (Hydrocortisone Acetate) .Marland Kitchen.. 1 Per Rectum Bid 22)  Anamantle Hc 3-0.5 %  Crea (Lidocaine-Hydrocortisone  Ace) .... Apply To Rectal Area Bid 23)  Nystatin 100000 Unit/gm Crea (Nystatin) .... Apply To Rectal Area 3-4 X Daily 24)  Pantoprazole Sodium 40 Mg Tbec (Pantoprazole Sodium) .Marland Kitchen.. 1 By Mouth Once Daily 25)  Calamine  Lotn (Calamine) .... Apply Moderate Amount To Affected Area Three Times A Day As Needed 26)  Tamsulosin Hcl 0.4 Mg Caps (Tamsulosin Hcl) .Marland Kitchen.. 1 By Mouth Once Daily 27)  Omeprazole 20 Mg Cpdr (Omeprazole) .... 2 By Mouth Once Daily 28)  Ranitidine Hcl 300 Mg Tabs (Ranitidine Hcl) .Marland Kitchen.. 1 By Mouth By Mouth At Bedtime 29)  Azithromycin 250 Mg Tabs (Azithromycin) .... 2po Qd For 1 Day, Then 1po Qd For 4days, Then Stop 30)  Hydrocodone-Homatropine 5-1.5 Mg/64ml Syrp (Hydrocodone-Homatropine) .Marland Kitchen.. 1 Tsp By Mouth Q 6 Hrs As Needed Cough  Allergies (verified): 1)  ! Sulfa  Past History:  Past Medical History: Last updated: 03/28/2009 Current Problems:  FATTY LIVER DISEASE (ICD-571.8) DIVERTICULOSIS OF COLON (ICD-562.10) GASTRITIS, CHRONIC (ICD-535.10) ESOPHAGITIS (ICD-530.10) ABDOMINAL TENDERNESS, EPIGASTRIC (ICD-789.66) BRADYCARDIA (ICD-427.89) CHEST PAIN (ICD-786.50) DIZZINESS (ICD-780.4) HEPATOTOXICITY, DRUG-INDUCED, RISK OF (ICD-V58.69) BLADDER CANCER (ICD-188.9) SPECIAL SCREENING MALIGNANT NEOPLASM OF PROSTATE (ICD-V76.44) COUGH (ICD-786.2) COLONIC POLYPS, HX OF (ICD-V12.72) ALLERGIC RHINITIS (ICD-477.9) RENAL INSUFFICIENCY  (ICD-588.9) ADENOIDECTOMY, HX OF (ICD-V45.79) CORONARY ARTERY DISEASE (ICD-414.00) CONGESTIVE HEART FAILURE (ICD-428.0) DISC DISEASE, LUMBAR (ICD-722.52) PEPTIC ULCER DISEASE (ICD-533.90) HIATAL HERNIA WITH REFLUX (ICD-553.3) RHEUMATOID ARTHRITIS (ICD-714.0) HYPERTENSION (ICD-401.9) HYPERLIPIDEMIA (ICD-272.4) GERD (ICD-530.81)  Past Surgical History: Last updated: 04/03/2009 Percutaneous transluminal coronary angioplasty Tonsillectomy Stress Cardiolite ( 11/06/2006) 2-D Echo (11/02/2006) Upper Endoscopy ( 03/01/2007) s/p left hip arthroplasty 2002 s/p bladder surgury s/p lumbar surgury 1/08  Transurethral resection of bladder cancer, Dr. Aldean Ast. 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  , mild ill  Head:  normocephalic and atraumatic.   Eyes:  vision grossly intact, pupils equal, and pupils round.   Ears:  bilat tm's red, sinus nontender Nose:  nasal dischargemucosal pallor and mucosal edema.   Mouth:  pharyngeal erythema and pharyngeal exudate.   Neck:  supple and cervical lymphadenopathy.   Lungs:  normal respiratory effort and normal breath sounds.   Heart:  normal rate and regular rhythm.   Extremities:  no edema, no erythema    Impression & Recommendations:  Problem # 1:  BRONCHITIS-ACUTE (ICD-466.0)  His updated medication list for this problem includes:    Azithromycin 250 Mg Tabs (Azithromycin) .Marland Kitchen... 2po qd for 1 day, then 1po qd for 4days, then stop    Hydrocodone-homatropine 5-1.5 Mg/67ml Syrp (Hydrocodone-homatropine) .Marland Kitchen... 1 tsp by mouth q 6 hrs as needed cough  Orders: T-2 View CXR, Same Day (71020.5TC) treat as above, f/u any worsening signs or symptoms, cant r/o pna, to check  cxr  Problem # 2:  CHEST PAIN (ICD-786.50) as above, to check cxr, declines ecg  Problem # 3:  HYPERTENSION (ICD-401.9)  His updated medication list for this problem includes:    Lisinopril 40 Mg Tabs (Lisinopril) .Marland Kitchen... 1 by mouth once daily    Amlodipine Besylate 5 Mg Tabs (Amlodipine besylate) .Marland Kitchen... Take one tablet by mouth daily    Hydrochlorothiazide 25 Mg Tabs (Hydrochlorothiazide) .Marland Kitchen... 1/2 by mouth once daily  BP today: 136/76 Prior BP: 128/76 (07/10/2009)  Labs Reviewed: K+:  3.9 (10/17/2008) Creat: : 1.0 (10/17/2008)   Chol: 138 (02/07/2008)   HDL: 30.5 (02/07/2008)   LDL: 89 (02/07/2008)   TG: 92 (02/07/2008) stable overall by hx and exam, ok to continue meds/tx as is   Complete Medication List: 1)  Tramadol Hcl 50 Mg Tabs (Tramadol hcl) .Marland Kitchen.. 1 by mouth qid as needed pain 2)  Gabapentin 300 Mg Caps (Gabapentin) .... Take 1 by mouth qid 3)  Fluticasone Propionate 50 Mcg/act Susp (Fluticasone propionate) .... Use 2 spray once daily each side 4)  Primidone 250 Mg Tabs (Primidone) .... Take 1/2 by mouth three times a day 5)  Lisinopril 40 Mg Tabs (Lisinopril) .Marland Kitchen.. 1 by mouth once daily 6)  Adult Aspirin Low Strength 81 Mg Tbdp (Aspirin) .... Take 1 by mouth qd 7)  Travatan 0.004 % Soln (Travoprost) .Marland Kitchen.. 1 drops both eye qhs 8)  Vitamin D3 1000 Unit Tabs (Cholecalciferol) .Marland Kitchen.. 1 by mouth once daily 9)  Multivitamins Tabs (Multiple vitamin) .Marland Kitchen.. 1 by mouth once daily 10)  Vitamin C Cr 500 Mg Cr-caps (Ascorbic acid) .Marland Kitchen.. 1 by mouth once daily 11)  Amlodipine Besylate 5 Mg Tabs (Amlodipine besylate) .... Take one tablet by mouth daily 12)  Simvastatin 40 Mg Tabs (Simvastatin) .Marland Kitchen.. 1 1/2 at bedtime 13)  Zyrtec Allergy 10 Mg Tabs (Cetirizine hcl) .Marland Kitchen.. 1 by mouth once daily 14)  Nitroglycerin 0.4 Mg Subl (Nitroglycerin) .Marland Kitchen.. 1 subl as needed 15)  Calcium 600 600 Mg Tabs (Calcium carbonate) .Marland Kitchen.. 1 by mouth once daily 16)  Hydrochlorothiazide 25 Mg Tabs (Hydrochlorothiazide) .... 1/2  by mouth once daily 17)  Meclizine Hcl 12.5 Mg Tabs (Meclizine hcl) .Marland Kitchen.. 1 - 2 by mouth q 6 hrs as needed dizziness/room going round 18)  Hyoscyamine Sulfate 0.125 Mg Tabs (Hyoscyamine sulfate) .... Take 1 q 4 hrs prn 19)  Gi Cocktail, Equal Parts Maalox, Donnatal, Xylocaine  .Marland Kitchen.. 1 tbsp q 12 hrs as needed chest pain 20)  Amlodipine Besylate 5 Mg Tabs (Amlodipine besylate) .... Take one tablet by mouth daily 21)  Anusol-hc 25 Mg Supp (Hydrocortisone acetate) .Marland Kitchen.. 1 per rectum bid 22)  Anamantle Hc 3-0.5 % Crea (Lidocaine-hydrocortisone ace) .... Apply to rectal area bid 23)  Nystatin 100000 Unit/gm Crea (Nystatin) .... Apply to rectal area 3-4 x daily 24)  Pantoprazole Sodium 40 Mg Tbec (Pantoprazole sodium) .Marland Kitchen.. 1 by mouth once daily 25)  Calamine Lotn (Calamine) .... Apply moderate amount to affected area three times a day as needed 26)  Tamsulosin Hcl 0.4 Mg Caps (Tamsulosin hcl) .Marland Kitchen.. 1 by mouth once daily 27)  Omeprazole 20 Mg Cpdr (Omeprazole) .... 2 by mouth once daily 28)  Ranitidine Hcl 300 Mg Tabs (Ranitidine hcl) .Marland Kitchen.. 1 by mouth by mouth at bedtime 29)  Azithromycin 250 Mg Tabs (Azithromycin) .... 2po qd for 1 day, then 1po qd for 4days, then stop 30)  Hydrocodone-homatropine 5-1.5 Mg/68ml Syrp (Hydrocodone-homatropine) .Marland Kitchen.. 1 tsp by mouth q 6 hrs as needed cough  Patient Instructions: 1)  Please take all new medications as prescribed 2)  Continue all previous medications as before this visit  3)  Please go to Radiology in the basement level for your X-Ray today  4)  Please schedule a follow-up appointment as needed. Prescriptions: HYDROCODONE-HOMATROPINE 5-1.5 MG/5ML SYRP (HYDROCODONE-HOMATROPINE) 1 tsp by mouth q 6 hrs as needed cough  #6 oz x 1   Entered and Authorized by:   Corwin Levins MD   Signed by:   Corwin Levins MD on 11/27/2009  Method used:   Print then Give to Patient   RxID:   [id number] AZITHROMYCIN 250 MG TABS (AZITHROMYCIN) 2po qd for 1 day, then 1po qd for  4days, then stop  #6 x 1   Entered and Authorized by:   Corwin Levins MD   Signed by:   Corwin Levins MD on 11/27/2009   Method used:   Print then Give to Patient   RxID:   [id number] FLUTICASONE PROPIONATE 50 MCG/ACT  SUSP (FLUTICASONE PROPIONATE) use 2 spray once daily each side  #3 x 3   Entered and Authorized by:   Corwin Levins MD   Signed by:   Corwin Levins MD on 11/27/2009   Method used:   Print then Give to Patient   RxID:   [id number] GABAPENTIN 300 MG  CAPS (GABAPENTIN) take 1 by mouth qid  #360 x 1   Entered and Authorized by:   Corwin Levins MD   Signed by:   Corwin Levins MD on 11/27/2009   Method used:   Print then Give to Patient   RxID:   [id number] OMEPRAZOLE 20 MG CPDR (OMEPRAZOLE) 2 by mouth once daily  #180 x 3   Entered and Authorized by:   Corwin Levins MD   Signed by:   Corwin Levins MD on 11/27/2009   Method used:   Print then Give to Patient   RxID:   [id number]

## 2010-08-15 NOTE — Assessment & Plan Note (Signed)
Summary: ER FU---BP WENT LOW--W LONG ER:  07/08/10---STC   Vital Signs:  Patient profile:   75 year old male Height:      67 inches Weight:      186.25 pounds BMI:     29.28 O2 Sat:      95 % on Room air Temp:     97.8 degrees F oral Pulse rate:   71 / minute BP sitting:   128 / 74  (left arm) Cuff size:   regular  Vitals Entered By: Zella Ball Ewing CMA Duncan Dull) (July 10, 2010 9:10 AM)  O2 Flow:  Room air CC: ER Followup/RE   Primary Care Provider:  Oliver Barre ,MD  CC:  ER Followup/RE.  History of Present Illness: pt here to f/u as rec'd after recent ER evaluation where 911 was called after he became weak and lightheaded after trying to stand up straight relatively quickly he states after bending over at the waist at the local [employer].  ER called, found SBP approx 80, given IVF's en route and BP and symptoms resolved by time he came to the ER.  As he seemed to be doing well,  no further eval done, no med changes and pt discharged to home.  Since then has done well as well;  Pt denies CP, worsening sob, doe, wheezing, orthopnea, pnd, worsening LE edema, palps, dizziness or syncope  Pt denies new neuro symptoms such as headache, facial or extremity weakness  Pt denies polydipsia, polyuria   Overall good compliance with meds, trying to follow low chol  diet, wt stable, little excercise however .  No fever, wt loss, night sweats, loss of appetite or other constitutional symptoms  .  Also with increased nasal allergy symptoms in the past few wks, without fever, pain, colored d/c, sT or cough.    Problems Prior to Update: 1)  Neck Pain, Left  (ICD-723.1) 2)  Bunion, Right Foot  (ICD-727.1) 3)  Rash and Other Nonspecific Skin Eruption  (ICD-782.1) 4)  Diarrhea  (ICD-787.91) 5)  Rectal Bleeding  (ICD-569.3) 6)  Diarrhea  (ICD-787.91) 7)  Fatty Liver Disease  (ICD-571.8) 8)  Diverticulosis of Colon  (ICD-562.10) 9)  Bradycardia  (ICD-427.89) 10)  Chest Pain  (ICD-786.50) 11)  Dizziness   (ICD-780.4) 12)  Hepatotoxicity, Drug-induced, Risk of  (ICD-V58.69) 13)  Bladder Cancer  (ICD-188.9) 14)  Special Screening Malignant Neoplasm of Prostate  (ICD-V76.44) 15)  Cough  (ICD-786.2) 16)  Colonic Polyps, Hx of  (ICD-V12.72) 17)  Allergic Rhinitis  (ICD-477.9) 18)  Renal Insufficiency  (ICD-588.9) 19)  Adenoidectomy, Hx of  (ICD-V45.79) 20)  Disc Disease, Lumbar  (ICD-722.52) 21)  Hiatal Hernia With Reflux  (ICD-553.3) 22)  Rheumatoid Arthritis  (ICD-714.0) 23)  Hypertension  (ICD-401.9) 24)  Hyperlipidemia  (ICD-272.4) 25)  Gerd  (ICD-530.81)  Medications Prior to Update: 1)  Gabapentin 300 Mg  Caps (Gabapentin) .... Take 1 By Mouth Three Times A Day and 2 By Mouth At Bedtime 2)  Fluticasone Propionate 50 Mcg/act  Susp (Fluticasone Propionate) .... Use 2 Spray Once Daily Each Side 3)  Primidone 250 Mg  Tabs (Primidone) .... Take 1/2 By Mouth Three Times A Day 4)  Lisinopril 40 Mg  Tabs (Lisinopril) .Marland Kitchen.. 1 By Mouth Once Daily 5)  Adult Aspirin Low Strength 81 Mg  Tbdp (Aspirin) .... Take 1 By Mouth Qd 6)  Travatan 0.004 %  Soln (Travoprost) .Marland Kitchen.. 1 Drops Both Eye Qhs 7)  Vitamin D3 1000 Unit  Tabs (Cholecalciferol) .Marland KitchenMarland KitchenMarland Kitchen 1  By Mouth Once Daily 8)  Multivitamins  Tabs (Multiple Vitamin) .Marland Kitchen.. 1 By Mouth Once Daily 9)  Vitamin C Cr 500 Mg Cr-Caps (Ascorbic Acid) .Marland Kitchen.. 1 By Mouth Once Daily 10)  Amlodipine Besylate 5 Mg Tabs (Amlodipine Besylate) .... Take One Tablet By Mouth Daily 11)  Simvastatin 40 Mg Tabs (Simvastatin) .Marland Kitchen.. 1 1/2 At Bedtime 12)  Zyrtec Allergy 10 Mg Tabs (Cetirizine Hcl) .Marland Kitchen.. 1 By Mouth Once Daily 13)  Nitroglycerin 0.4 Mg Subl (Nitroglycerin) .Marland Kitchen.. 1 Subl As Needed 14)  Calcium 600 600 Mg Tabs (Calcium Carbonate) .Marland Kitchen.. 1 By Mouth Once Daily 15)  Hydrochlorothiazide 25 Mg Tabs (Hydrochlorothiazide) .... 1/2 By Mouth Once Daily 16)  Meclizine Hcl 12.5 Mg Tabs (Meclizine Hcl) .Marland Kitchen.. 1 - 2 By Mouth Q 6 Hrs As Needed Dizziness/room Going Round 17)  Hyoscyamine Sulfate  0.125 Mg Tabs (Hyoscyamine Sulfate) .... Take 1 Q 4 Hrs Prn 18)  Anusol-Hc 25 Mg  Supp (Hydrocortisone Acetate) .Marland Kitchen.. 1 Per Rectum Bid 19)  Anamantle Hc 3-0.5 %  Crea (Lidocaine-Hydrocortisone Ace) .... Apply To Rectal Area Bid 20)  Tamsulosin Hcl 0.4 Mg Caps (Tamsulosin Hcl) .Marland Kitchen.. 1 By Mouth Once Daily 21)  Omeprazole 20 Mg Cpdr (Omeprazole) .... 2 By Mouth Once Daily 22)  Ranitidine Hcl 300 Mg Tabs (Ranitidine Hcl) .Marland Kitchen.. 1 By Mouth By Mouth At Bedtime 23)  Clobetasol Propionate 0.05 % Oint (Clobetasol Propionate) .... Apply To Affected Area Once Daily 24)  Tylenol With Codeine #3 300-30 Mg Tabs (Acetaminophen-Codeine) .Marland Kitchen.. 1 By Mouth Two Times A Day As Needed  Current Medications (verified): 1)  Gabapentin 300 Mg  Caps (Gabapentin) .... Take 1 By Mouth Three Times A Day and 2 By Mouth At Bedtime 2)  Fluticasone Propionate 50 Mcg/act  Susp (Fluticasone Propionate) .... Use 2 Spray Once Daily Each Side 3)  Primidone 250 Mg  Tabs (Primidone) .... Take 1/2 By Mouth Three Times A Day 4)  Lisinopril 40 Mg  Tabs (Lisinopril) .Marland Kitchen.. 1 By Mouth Once Daily 5)  Adult Aspirin Low Strength 81 Mg  Tbdp (Aspirin) .... Take 1 By Mouth Qd 6)  Travatan 0.004 %  Soln (Travoprost) .Marland Kitchen.. 1 Drops Both Eye Qhs 7)  Vitamin D3 1000 Unit  Tabs (Cholecalciferol) .Marland Kitchen.. 1 By Mouth Once Daily 8)  Multivitamins  Tabs (Multiple Vitamin) .Marland Kitchen.. 1 By Mouth Once Daily 9)  Vitamin C Cr 500 Mg Cr-Caps (Ascorbic Acid) .Marland Kitchen.. 1 By Mouth Once Daily 10)  Amlodipine Besylate 5 Mg Tabs (Amlodipine Besylate) .... Take One Tablet By Mouth Daily 11)  Simvastatin 40 Mg Tabs (Simvastatin) .Marland Kitchen.. 1 1/2 At Bedtime 12)  Zyrtec Allergy 10 Mg Tabs (Cetirizine Hcl) .Marland Kitchen.. 1 By Mouth Once Daily 13)  Nitroglycerin 0.4 Mg Subl (Nitroglycerin) .Marland Kitchen.. 1 Subl As Needed 14)  Calcium 600 600 Mg Tabs (Calcium Carbonate) .Marland Kitchen.. 1 By Mouth Once Daily 15)  Hydrochlorothiazide 25 Mg Tabs (Hydrochlorothiazide) .... 1/2 By Mouth Once Daily 16)  Meclizine Hcl 12.5 Mg Tabs  (Meclizine Hcl) .Marland Kitchen.. 1 - 2 By Mouth Q 6 Hrs As Needed Dizziness/room Going Round 17)  Hyoscyamine Sulfate 0.125 Mg Tabs (Hyoscyamine Sulfate) .... Take 1 Q 4 Hrs Prn 18)  Anusol-Hc 25 Mg  Supp (Hydrocortisone Acetate) .Marland Kitchen.. 1 Per Rectum Bid 19)  Anamantle Hc 3-0.5 %  Crea (Lidocaine-Hydrocortisone Ace) .... Apply To Rectal Area Bid 20)  Tamsulosin Hcl 0.4 Mg Caps (Tamsulosin Hcl) .Marland Kitchen.. 1 By Mouth Once Daily 21)  Omeprazole 20 Mg Cpdr (Omeprazole) .... 2 By Mouth Once Daily 22)  Ranitidine Hcl 300 Mg Tabs (Ranitidine Hcl) .Marland Kitchen.. 1 By Mouth By Mouth At Bedtime 23)  Clobetasol Propionate 0.05 % Oint (Clobetasol Propionate) .... Apply To Affected Area Once Daily 24)  Tylenol With Codeine #3 300-30 Mg Tabs (Acetaminophen-Codeine) .Marland Kitchen.. 1 By Mouth Two Times A Day As Needed  Allergies (verified): 1)  ! Sulfa  Past History:  Past Medical History: Last updated: 03/28/2009 Current Problems:  FATTY LIVER DISEASE (ICD-571.8) DIVERTICULOSIS OF COLON (ICD-562.10) GASTRITIS, CHRONIC (ICD-535.10) ESOPHAGITIS (ICD-530.10) ABDOMINAL TENDERNESS, EPIGASTRIC (ICD-789.66) BRADYCARDIA (ICD-427.89) CHEST PAIN (ICD-786.50) DIZZINESS (ICD-780.4) HEPATOTOXICITY, DRUG-INDUCED, RISK OF (ICD-V58.69) BLADDER CANCER (ICD-188.9) SPECIAL SCREENING MALIGNANT NEOPLASM OF PROSTATE (ICD-V76.44) COUGH (ICD-786.2) COLONIC POLYPS, HX OF (ICD-V12.72) ALLERGIC RHINITIS (ICD-477.9) RENAL INSUFFICIENCY (ICD-588.9) ADENOIDECTOMY, HX OF (ICD-V45.79) CORONARY ARTERY DISEASE (ICD-414.00) CONGESTIVE HEART FAILURE (ICD-428.0) DISC DISEASE, LUMBAR (ICD-722.52) PEPTIC ULCER DISEASE (ICD-533.90) HIATAL HERNIA WITH REFLUX (ICD-553.3) RHEUMATOID ARTHRITIS (ICD-714.0) HYPERTENSION (ICD-401.9) HYPERLIPIDEMIA (ICD-272.4) GERD (ICD-530.81)  Past Surgical History: Last updated: 04/03/2009 Percutaneous transluminal coronary angioplasty Tonsillectomy Stress Cardiolite ( 11/06/2006) 2-D Echo (11/02/2006) Upper Endoscopy (  03/01/2007) s/p left hip arthroplasty 2002 s/p bladder surgury s/p lumbar surgury 1/08  Transurethral resection of bladder cancer, Dr. Aldean Ast. Cataract Extraction  Social History: Last updated: 05/14/2010 Married 2 children retired from [employer] power Never Smoked Alcohol use-no Drug use-no  Risk Factors: Smoking Status: never (02/07/2008)  Review of Systems       all otherwise negative per pt -    Physical Exam  General:  alert and overweight-appearing.   Head:  normocephalic and atraumatic.   Eyes:  vision grossly intact, pupils equal, and pupils round.   Ears:  R ear normal and L ear normal.   Nose:  nasal dischargemucosal pallor and mucosal edema.   Mouth:  no gingival abnormalities and pharynx pink and moist.   Neck:  supple and no masses.  , neck FROM, NT  - no spasm noted Lungs:  normal respiratory effort, R decreased breath sounds, and L decreased breath sounds.   Heart:  normal rate and regular rhythm.   Abdomen:  soft, non-tender, and normal bowel sounds.   Msk:  spine nontender, no joint tenderness and no joint swelling.   Extremities:  no edema, no erythema  Neurologic:  alert & oriented X3 and strength normal in all extremities.     Impression & Recommendations:  Problem # 1:  DIZZINESS (ICD-780.4)  His updated medication list for this problem includes:    Zyrtec Allergy 10 Mg Tabs (Cetirizine hcl) .Marland Kitchen... 1 by mouth once daily    Meclizine Hcl 12.5 Mg Tabs (Meclizine hcl) .Marland Kitchen... 1 - 2 by mouth q 6 hrs as needed dizziness/room going round orthostatic type with position change, no LOC, single episode without assoc symtpoms and no further symptoms since then;  will cont meds as is, pt wishes to defer further evaluation for now unless further symptoms occur, such as ecg, labs, holter monitor, echo, card evaluation, stress testing  Problem # 2:  HYPERTENSION (ICD-401.9)  His updated medication list for this problem includes:    Lisinopril 40 Mg Tabs  (Lisinopril) .Marland Kitchen... 1 by mouth once daily    Amlodipine Besylate 5 Mg Tabs (Amlodipine besylate) .Marland Kitchen... Take one tablet by mouth daily    Hydrochlorothiazide 25 Mg Tabs (Hydrochlorothiazide) .Marland Kitchen... 1/2 by mouth once daily  BP today: 128/74 Prior BP: 112/70 (05/14/2010)  Labs Reviewed: K+: 3.9 (10/17/2008) Creat: : 1.0 (10/17/2008)   Chol: 138 (02/07/2008)   HDL: 30.5 (02/07/2008)   LDL: 89 (02/07/2008)  TG: 92 (02/07/2008) stable overall by hx and exam, ok to continue meds/tx as is , consider stop diuetic for further similar symtpoms  Problem # 3:  ALLERGIC RHINITIS (ICD-477.9)  His updated medication list for this problem includes:    Fluticasone Propionate 50 Mcg/act Susp (Fluticasone propionate) ..... Use 2 spray once daily each side    Zyrtec Allergy 10 Mg Tabs (Cetirizine hcl) .Marland Kitchen... 1 by mouth once daily  Discussed use of allergy medications and environmental measures.  treat as above, f/u any worsening signs or symptoms  - to re-start meds  Complete Medication List: 1)  Gabapentin 300 Mg Caps (Gabapentin) .... Take 1 by mouth three times a day and 2 by mouth at bedtime 2)  Fluticasone Propionate 50 Mcg/act Susp (Fluticasone propionate) .... Use 2 spray once daily each side 3)  Primidone 250 Mg Tabs (Primidone) .... Take 1/2 by mouth three times a day 4)  Lisinopril 40 Mg Tabs (Lisinopril) .Marland Kitchen.. 1 by mouth once daily 5)  Adult Aspirin Low Strength 81 Mg Tbdp (Aspirin) .... Take 1 by mouth qd 6)  Travatan 0.004 % Soln (Travoprost) .Marland Kitchen.. 1 drops both eye qhs 7)  Vitamin D3 1000 Unit Tabs (Cholecalciferol) .Marland Kitchen.. 1 by mouth once daily 8)  Multivitamins Tabs (Multiple vitamin) .Marland Kitchen.. 1 by mouth once daily 9)  Vitamin C Cr 500 Mg Cr-caps (Ascorbic acid) .Marland Kitchen.. 1 by mouth once daily 10)  Amlodipine Besylate 5 Mg Tabs (Amlodipine besylate) .... Take one tablet by mouth daily 11)  Simvastatin 40 Mg Tabs (Simvastatin) .Marland Kitchen.. 1 1/2 at bedtime 12)  Zyrtec Allergy 10 Mg Tabs (Cetirizine hcl) .Marland Kitchen.. 1 by  mouth once daily 13)  Nitroglycerin 0.4 Mg Subl (Nitroglycerin) .Marland Kitchen.. 1 subl as needed 14)  Calcium 600 600 Mg Tabs (Calcium carbonate) .Marland Kitchen.. 1 by mouth once daily 15)  Hydrochlorothiazide 25 Mg Tabs (Hydrochlorothiazide) .... 1/2 by mouth once daily 16)  Meclizine Hcl 12.5 Mg Tabs (Meclizine hcl) .Marland Kitchen.. 1 - 2 by mouth q 6 hrs as needed dizziness/room going round 17)  Hyoscyamine Sulfate 0.125 Mg Tabs (Hyoscyamine sulfate) .... Take 1 q 4 hrs prn 18)  Anusol-hc 25 Mg Supp (Hydrocortisone acetate) .Marland Kitchen.. 1 per rectum bid 19)  Anamantle Hc 3-0.5 % Crea (Lidocaine-hydrocortisone ace) .... Apply to rectal area bid 20)  Tamsulosin Hcl 0.4 Mg Caps (Tamsulosin hcl) .Marland Kitchen.. 1 by mouth once daily 21)  Omeprazole 20 Mg Cpdr (Omeprazole) .... 2 by mouth once daily 22)  Ranitidine Hcl 300 Mg Tabs (Ranitidine hcl) .Marland Kitchen.. 1 by mouth by mouth at bedtime 23)  Clobetasol Propionate 0.05 % Oint (Clobetasol propionate) .... Apply to affected area once daily 24)  Tylenol With Codeine #3 300-30 Mg Tabs (Acetaminophen-codeine) .Marland Kitchen.. 1 by mouth two times a day as needed  Patient Instructions: 1)  Continue all previous medications as before this visit , although remember if you cont to lose wt , you may be able to eventually stop the fluid pill that you take for blood pressure, or even cut back on other meds 2)  Please schedule a follow-up appointment in 5  months, or sooner if needed   Orders Added: 1)  Est. Patient Level IV [56213]

## 2010-08-15 NOTE — Miscellaneous (Signed)
Summary: Orders Update   Clinical Lists Changes  Orders: Added new Referral order of Radiology Referral (Radiology) - Signed  Appended Document: Orders Update pt due for f/u RAS eval

## 2010-08-15 NOTE — Progress Notes (Signed)
Summary: medication clarification  Phone Note From Pharmacy   Caller: [facility] Summary of Call: [facility] received prescription for Tansulosin 0.4mg .  The patient has reported a sulfonamides allergy to [facility], should they continue to fill the prescription? Fax# [phone] Initial call taken by: Zella Ball Ewing CMA Duncan Dull),  May 24, 2010 3:10 PM  Follow-up for Phone Call        ok to cont as is Follow-up by: Corwin Levins MD,  May 24, 2010 3:33 PM  Additional Follow-up for Phone Call Additional follow up Details #1::        informed pharmacy Additional Follow-up by: [name] Ewing CMA Duncan Dull),  May 24, 2010 3:54 PM

## 2010-08-15 NOTE — Progress Notes (Signed)
  Phone Note Call from Patient Call back at Home Phone [phone]   Caller: Patient Call For: Dr Jonny Ruiz Summary of Call: Pt states pharmacy faxed refill request for his stomach/ulcer medicine and have not head back, [facility] is the pharmay. Please let pt know. Initial call taken by: Verdell Face,  August 06, 2009 1:25 PM    Prescriptions: PANTOPRAZOLE SODIUM 40 MG TBEC (PANTOPRAZOLE SODIUM) 1 by mouth once daily  #90 x 2   Entered by:   Margaret Pyle, CMA   Authorized by:   Corwin Levins MD   Signed by:   Margaret Pyle, CMA on 08/06/2009   Method used:   Faxed to ...       [facility] (mail-order)       [id number]       [place], [place]  [id number]       Ph: [phone]       Fax: [phone]   RxID:   [id number]

## 2010-08-15 NOTE — Progress Notes (Signed)
Summary: Rx change  Phone Note Call from Patient Call back at Home Phone [phone]   Caller: Patient Call For: Casey Levins MD Summary of Call: Patient came into the office requesting a prescription of Omeprazole 40 mg be sent to [facility]. He said Dr. Yetta Barre took him off this medication and put him on Dexilant because he was having some chest pains and it was believed to be connected to the medication. However, the Dexilant is too expensive and the patient says that the medication change has not made any difference with the chest pains.  Initial call taken by: Irma Newness,  July 17, 2009 10:49 AM  Follow-up for Phone Call        okay to change back, please advise Follow-up by: Margaret Pyle, CMA,  July 17, 2009 11:02 AM  Additional Follow-up for Phone Call Additional follow up Details #1::        ok to change to pantoprazole 40 mg per day which is generic now - to robin; consider OV for further compalints Additional Follow-up by: Casey Levins MD,  July 17, 2009 11:43 AM    New/Updated Medications: PANTOPRAZOLE SODIUM 40 MG TBEC (PANTOPRAZOLE SODIUM) 1 by mouth once daily Prescriptions: PANTOPRAZOLE SODIUM 40 MG TBEC (PANTOPRAZOLE SODIUM) 1 by mouth once daily  #30 x 1   Entered by:   Scharlene Gloss   Authorized by:   Casey Levins MD   Signed by:   Scharlene Gloss on 07/17/2009   Method used:   Faxed to ...       [facility] [address] (retail)       [address]       [place], [place]  [address]       Ph: [phone]       Fax: [phone]   RxID:   [id number]

## 2010-09-12 ENCOUNTER — Ambulatory Visit (INDEPENDENT_AMBULATORY_CARE_PROVIDER_SITE_OTHER)
Admission: RE | Admit: 2010-09-12 | Discharge: 2010-09-12 | Disposition: A | Discharge status: Home / Self Care | Payer: Medicare Other | Source: Ambulatory Visit | Attending: Internal Medicine | Admitting: Internal Medicine

## 2010-09-12 ENCOUNTER — Encounter: Payer: Self-pay | Admitting: Internal Medicine

## 2010-09-12 ENCOUNTER — Other Ambulatory Visit: Payer: Self-pay | Admitting: Internal Medicine

## 2010-09-12 ENCOUNTER — Other Ambulatory Visit: Payer: Medicare Other

## 2010-09-12 ENCOUNTER — Ambulatory Visit (INDEPENDENT_AMBULATORY_CARE_PROVIDER_SITE_OTHER): Payer: Medicare Other | Admitting: Internal Medicine

## 2010-09-12 DIAGNOSIS — R5383 Other fatigue: Secondary | ICD-10-CM

## 2010-09-12 DIAGNOSIS — R5381 Other malaise: Secondary | ICD-10-CM

## 2010-09-12 DIAGNOSIS — I701 Atherosclerosis of renal artery: Secondary | ICD-10-CM

## 2010-09-12 DIAGNOSIS — R42 Dizziness and giddiness: Secondary | ICD-10-CM

## 2010-09-12 DIAGNOSIS — F329 Major depressive disorder, single episode, unspecified: Secondary | ICD-10-CM | POA: Insufficient documentation

## 2010-09-12 DIAGNOSIS — I498 Other specified cardiac arrhythmias: Secondary | ICD-10-CM

## 2010-09-12 DIAGNOSIS — I10 Essential (primary) hypertension: Secondary | ICD-10-CM

## 2010-09-12 DIAGNOSIS — E785 Hyperlipidemia, unspecified: Secondary | ICD-10-CM

## 2010-09-12 DIAGNOSIS — F32A Depression, unspecified: Secondary | ICD-10-CM | POA: Insufficient documentation

## 2010-09-12 LAB — CBC WITH DIFFERENTIAL/PLATELET
Basophils Absolute: 0 10*3/uL (ref 0.0–0.1)
Eosinophils Absolute: 0 10*3/uL (ref 0.0–0.7)
Lymphocytes Relative: 18 % (ref 12.0–46.0)
MCHC: 35 g/dL (ref 30.0–36.0)
Neutro Abs: 5.3 10*3/uL (ref 1.4–7.7)
Neutrophils Relative %: 69.7 % (ref 43.0–77.0)
Platelets: 275 10*3/uL (ref 150.0–400.0)
RDW: 13 % (ref 11.5–14.6)

## 2010-09-12 LAB — BASIC METABOLIC PANEL
BUN: 15 mg/dL (ref 6–23)
CO2: 25 mEq/L (ref 19–32)
Calcium: 9.1 mg/dL (ref 8.4–10.5)
Creatinine, Ser: 1.1 mg/dL (ref 0.4–1.5)
Glucose, Bld: 86 mg/dL (ref 70–99)

## 2010-09-12 LAB — HEPATIC FUNCTION PANEL
Albumin: 4.4 g/dL (ref 3.5–5.2)
Alkaline Phosphatase: 69 U/L (ref 39–117)

## 2010-09-12 LAB — LIPID PANEL
Cholesterol: 138 mg/dL (ref 0–200)
VLDL: 24 mg/dL (ref 0.0–40.0)

## 2010-09-19 NOTE — Assessment & Plan Note (Signed)
Summary: fluctuating blood pressure/lb   Vital Signs:  Patient profile:   75 year old male Height:      67 inches Weight:      184.25 pounds BMI:     28.96 O2 Sat:      96 % on Room air Temp:     98.2 degrees F oral Pulse rate:   62 / minute BP sitting:   112 / 62  (left arm) Cuff size:   regular  Vitals Entered By: Zella Ball Ewing CMA (AAMA) (September 12, 2010 11:16 AM)  O2 Flow:  Room air  CC: BP up and down/RE   Primary Care Provider:  Oliver Barre ,MD  CC:  BP up and down/RE.  History of Present Illness: here to f/u - c/o dizziness (? vertigo like) with marked fatigue since late jan, seems ? worse with taking the HCTZ, but only on low dose, and when he does not take BP always higher , often just over 140's.  Pt denies CP, worsening sob, doe, wheezing, orthopnea, pnd, worsening LE edema, palps,  or syncope  Pt denies new neuro symptoms such as headache, facial or extremity weakness .  Has known RAS , and some concern for bradycardia at last hospn.  Overall symtpoms worse in the past 2 wks, face feeels warm off and on, .but No fever, wt loss, night sweats, loss of appetite or other constitutional symptoms .  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  The worst part is worsening excercise toleracne due to the fatigue  - used to walk upt ot 3 miles per day outside last fall, but now can only make 1/2 mile on treadmill slower.  There was some concern for need for tilt table previously.  Also wtih  worsening depressive symptoms, but no suicidal ideation, or panic with worsening debility.  Overall good compliance with meds, and good tolerability, except does not take the hctz sometimes as above.  Pt denies polydipsia, polyuria,  trying to follow low chol, diet, wt stable.  Problems Prior to Update: 1)  Bradycardia  (ICD-427.89) 2)  Depression  (ICD-311) 3)  Renal Artery Stenosis  (ICD-440.1) 4)  Fatigue  (ICD-780.79) 5)  Dizziness  (ICD-780.4) 6)   Neck Pain, Left  (ICD-723.1) 7)  Bunion, Right Foot  (ICD-727.1) 8)  Rash and Other Nonspecific Skin Eruption  (ICD-782.1) 9)  Diarrhea  (ICD-787.91) 10)  Rectal Bleeding  (ICD-569.3) 11)  Diarrhea  (ICD-787.91) 12)  Fatty Liver Disease  (ICD-571.8) 13)  Diverticulosis of Colon  (ICD-562.10) 14)  Bradycardia  (ICD-427.89) 15)  Chest Pain  (ICD-786.50) 16)  Dizziness  (ICD-780.4) 17)  Hepatotoxicity, Drug-induced, Risk of  (ICD-V58.69) 18)  Bladder Cancer  (ICD-188.9) 19)  Special Screening Malignant Neoplasm of Prostate  (ICD-V76.44) 20)  Cough  (ICD-786.2) 21)  Colonic Polyps, Hx of  (ICD-V12.72) 22)  Allergic Rhinitis  (ICD-477.9) 23)  Renal Insufficiency  (ICD-588.9) 24)  Adenoidectomy, Hx of  (ICD-V45.79) 25)  Disc Disease, Lumbar  (ICD-722.52) 26)  Hiatal Hernia With Reflux  (ICD-553.3) 27)  Rheumatoid Arthritis  (ICD-714.0) 28)  Hypertension  (ICD-401.9) 29)  Hyperlipidemia  (ICD-272.4) 30)  Gerd  (ICD-530.81)  Medications Prior to Update: 1)  Gabapentin 300 Mg  Caps (Gabapentin) .... Take 1 By Mouth Three Times A Day and 2 By Mouth At Bedtime 2)  Fluticasone Propionate 50 Mcg/act  Susp (Fluticasone Propionate) .... Use 2 Spray Once Daily Each Side 3)  Primidone 250 Mg  Tabs (Primidone) .... Take 1/2 By Mouth Three Times A Day 4)  Lisinopril 40 Mg  Tabs (Lisinopril) .Marland Kitchen.. 1 By Mouth Once Daily 5)  Adult Aspirin Low Strength 81 Mg  Tbdp (Aspirin) .... Take 1 By Mouth Qd 6)  Travatan 0.004 %  Soln (Travoprost) .Marland Kitchen.. 1 Drops Both Eye Qhs 7)  Vitamin D3 1000 Unit  Tabs (Cholecalciferol) .Marland Kitchen.. 1 By Mouth Once Daily 8)  Multivitamins  Tabs (Multiple Vitamin) .Marland Kitchen.. 1 By Mouth Once Daily 9)  Vitamin C Cr 500 Mg Cr-Caps (Ascorbic Acid) .Marland Kitchen.. 1 By Mouth Once Daily 10)  Amlodipine Besylate 5 Mg Tabs (Amlodipine Besylate) .... Take One Tablet By Mouth Daily 11)  Simvastatin 40 Mg Tabs (Simvastatin) .Marland Kitchen.. 1 1/2 At Bedtime 12)  Zyrtec Allergy 10 Mg Tabs (Cetirizine Hcl) .Marland Kitchen.. 1 By Mouth Once  Daily 13)  Nitroglycerin 0.4 Mg Subl (Nitroglycerin) .Marland Kitchen.. 1 Subl As Needed 14)  Calcium 600 600 Mg Tabs (Calcium Carbonate) .Marland Kitchen.. 1 By Mouth Once Daily 15)  Hydrochlorothiazide 25 Mg Tabs (Hydrochlorothiazide) .... 1/2 By Mouth Once Daily 16)  Meclizine Hcl 12.5 Mg Tabs (Meclizine Hcl) .Marland Kitchen.. 1 - 2 By Mouth Q 6 Hrs As Needed Dizziness/room Going Round 17)  Hyoscyamine Sulfate 0.125 Mg Tabs (Hyoscyamine Sulfate) .... Take 1 Q 4 Hrs Prn 18)  Anusol-Hc 25 Mg  Supp (Hydrocortisone Acetate) .Marland Kitchen.. 1 Per Rectum Bid 19)  Anamantle Hc 3-0.5 %  Crea (Lidocaine-Hydrocortisone Ace) .... Apply To Rectal Area Bid 20)  Tamsulosin Hcl 0.4 Mg Caps (Tamsulosin Hcl) .Marland Kitchen.. 1 By Mouth Once Daily 21)  Omeprazole 20 Mg Cpdr (Omeprazole) .... 2 By Mouth Once Daily 22)  Ranitidine Hcl 300 Mg Tabs (Ranitidine Hcl) .Marland Kitchen.. 1 By Mouth By Mouth At Bedtime 23)  Clobetasol Propionate 0.05 % Oint (Clobetasol Propionate) .... Apply To Affected Area Once Daily 24)  Tylenol With Codeine #3 300-30 Mg Tabs (Acetaminophen-Codeine) .Marland Kitchen.. 1 By Mouth Two Times A Day As Needed  Current Medications (verified): 1)  Gabapentin 300 Mg  Caps (Gabapentin) .... Take 1 By Mouth Three Times A Day and 2 By Mouth At Bedtime 2)  Fluticasone Propionate 50 Mcg/act  Susp (Fluticasone Propionate) .... Use 2 Spray Once Daily Each Side 3)  Primidone 250 Mg  Tabs (Primidone) .... Take 1/2 By Mouth Three Times A Day 4)  Lisinopril 40 Mg  Tabs (Lisinopril) .Marland Kitchen.. 1 By Mouth Once Daily 5)  Adult Aspirin Low Strength 81 Mg  Tbdp (Aspirin) .... Take 1 By Mouth Qd 6)  Travatan 0.004 %  Soln (Travoprost) .Marland Kitchen.. 1 Drops Both Eye Qhs 7)  Vitamin D3 1000 Unit  Tabs (Cholecalciferol) .Marland Kitchen.. 1 By Mouth Once Daily 8)  Multivitamins  Tabs (Multiple Vitamin) .Marland Kitchen.. 1 By Mouth Once Daily 9)  Vitamin C Cr 500 Mg Cr-Caps (Ascorbic Acid) .Marland Kitchen.. 1 By Mouth Once Daily 10)  Amlodipine Besylate 5 Mg Tabs (Amlodipine Besylate) .... Take One Tablet By Mouth Daily 11)  Simvastatin 40 Mg Tabs  (Simvastatin) .Marland Kitchen.. 1 1/2 At Bedtime 12)  Zyrtec Allergy 10 Mg Tabs (Cetirizine Hcl) .Marland Kitchen.. 1 By Mouth Once Daily 13)  Nitroglycerin 0.4 Mg Subl (Nitroglycerin) .Marland Kitchen.. 1 Subl As Needed 14)  Calcium 600 600 Mg Tabs (Calcium Carbonate) .Marland Kitchen.. 1 By Mouth Once Daily 15)  Hydrochlorothiazide 25 Mg Tabs (Hydrochlorothiazide) .... 1/2 By Mouth Once Daily 16)  Meclizine Hcl 12.5 Mg Tabs (Meclizine Hcl) .Marland Kitchen.. 1 - 2 By Mouth Q 6 Hrs As Needed Dizziness/room Going Round 17)  Hyoscyamine Sulfate 0.125 Mg Tabs (Hyoscyamine  Sulfate) .... Take 1 Q 4 Hrs Prn 18)  Anusol-Hc 25 Mg  Supp (Hydrocortisone Acetate) .Marland Kitchen.. 1 Per Rectum Bid 19)  Anamantle Hc 3-0.5 %  Crea (Lidocaine-Hydrocortisone Ace) .... Apply To Rectal Area Bid 20)  Tamsulosin Hcl 0.4 Mg Caps (Tamsulosin Hcl) .Marland Kitchen.. 1 By Mouth Once Daily 21)  Omeprazole 20 Mg Cpdr (Omeprazole) .... 2 By Mouth Once Daily 22)  Ranitidine Hcl 300 Mg Tabs (Ranitidine Hcl) .Marland Kitchen.. 1 By Mouth By Mouth At Bedtime 23)  Clobetasol Propionate 0.05 % Oint (Clobetasol Propionate) .... Apply To Affected Area Once Daily 24)  Tylenol With Codeine #3 300-30 Mg Tabs (Acetaminophen-Codeine) .Marland Kitchen.. 1 By Mouth Two Times A Day As Needed 25)  Citalopram Hydrobromide 10 Mg Tabs (Citalopram Hydrobromide) .Marland Kitchen.. 1po Once Daily  Allergies (verified): 1)  ! Sulfa  Past History:  Past Surgical History: Last updated: 04/03/2009 Percutaneous transluminal coronary angioplasty Tonsillectomy Stress Cardiolite ( 11/06/2006) 2-D Echo (11/02/2006) Upper Endoscopy ( 03/01/2007) s/p left hip arthroplasty 2002 s/p bladder surgury s/p lumbar surgury 1/08  Transurethral resection of bladder cancer, Dr. Aldean Ast. Cataract Extraction  Family History: Last updated: 2009-08-05 mother died age 18 with MI father, sister and 2 brothers with lung cancer! Family History of Diabetes:Sister  Brothers lung cancer has spread to his brain  Social History: Last updated: 05/14/2010 Married 2 children retired from [employer]  power Never Smoked Alcohol use-no Drug use-no  Risk Factors: Smoking Status: never (02/07/2008)  Past Medical History: Current Problems:  FATTY LIVER DISEASE (ICD-571.8) DIVERTICULOSIS OF COLON (ICD-562.10) GASTRITIS, CHRONIC (ICD-535.10) ESOPHAGITIS (ICD-530.10) ABDOMINAL TENDERNESS, EPIGASTRIC (ICD-789.66) BRADYCARDIA (ICD-427.89) CHEST PAIN (ICD-786.50) DIZZINESS (ICD-780.4) HEPATOTOXICITY, DRUG-INDUCED, RISK OF (ICD-V58.69) BLADDER CANCER (ICD-188.9) SPECIAL SCREENING MALIGNANT NEOPLASM OF PROSTATE (ICD-V76.44) COUGH (ICD-786.2) COLONIC POLYPS, HX OF (ICD-V12.72) ALLERGIC RHINITIS (ICD-477.9) RENAL INSUFFICIENCY (ICD-588.9) ADENOIDECTOMY, HX OF (ICD-V45.79) CORONARY ARTERY DISEASE (ICD-414.00) CONGESTIVE HEART FAILURE (ICD-428.0) DISC DISEASE, LUMBAR (ICD-722.52) PEPTIC ULCER DISEASE (ICD-533.90) HIATAL HERNIA WITH REFLUX (ICD-553.3) RHEUMATOID ARTHRITIS (ICD-714.0) HYPERTENSION (ICD-401.9) HYPERLIPIDEMIA (ICD-272.4) GERD (ICD-530.81) Depression  Review of Systems       The patient complains of difficulty walking and depression.  The patient denies anorexia, fever, vision loss, decreased hearing, hoarseness, chest pain, syncope, dyspnea on exertion, peripheral edema, prolonged cough, headaches, hemoptysis, abdominal pain, melena, hematochezia, severe indigestion/heartburn, hematuria, suspicious skin lesions, transient blindness, unusual weight change, abnormal bleeding, enlarged lymph nodes, and angioedema.         all otherwise negative per pt -    Physical Exam  General:  alert and overweight-appearing.   Head:  normocephalic and atraumatic.   Eyes:  vision grossly intact, pupils equal, and pupils round.   Ears:  R ear normal and L ear normal.   Nose:  nasal dischargemucosal pallor and mucosal edema.   Mouth:  no gingival abnormalities and pharynx pink and moist.   Neck:  supple and no masses.  , neck FROM, NT  - no spasm noted Lungs:  normal respiratory  effort, R decreased breath sounds, and L decreased breath sounds.   Heart:  normal rate and regular rhythm.   Abdomen:  soft, non-tender, and normal bowel sounds.   Msk:  spine nontender, no joint tenderness and no joint swelling.   Extremities:  no edema, no erythema  Neurologic:  alert & oriented X3 and strength normal in all extremities.  gait normal.   Skin:  color normal and no rashes.   Psych:  depressed affect and slightly anxious.     Impression & Recommendations:  Problem # 1:  DIZZINESS (ICD-780.4) Assessment New  His updated medication list for this problem includes:    Zyrtec Allergy 10 Mg Tabs (Cetirizine hcl) .Marland Kitchen... 1 by mouth once daily    Meclizine Hcl 12.5 Mg Tabs (Meclizine hcl) .Marland Kitchen... 1 - 2 by mouth q 6 hrs as needed dizziness/room going round ? vertigo vs other, but also with marked fatigue, and concern previously for bradycardia on last admit sept 2010 - for ecg, echo, 24 hr holter, cxr , and labs below, and refer Dr taylor/card   Orders: EKG w/ Interpretation (93000) T-2 View CXR, Same Day (71020.5TC) Cardiology Referral (Cardiology) Misc. Referral (Misc. Ref) Echo Referral (Echo)  Problem # 2:  FATIGUE (ICD-780.79) Assessment: New  with more excercise intolerance than 3-4 mo ago , with secondary situational depression it seems due to not being able to be as active  - exam benign, to check labs below; follow with expectant management   Orders: T-2 View CXR, Same Day (71020.5TC) Cardiology Referral (Cardiology) Misc. Referral (Misc. Ref) Echo Referral (Echo) TLB-BMP (Basic Metabolic Panel-BMET) (80048-METABOL) TLB-CBC Platelet - w/Differential (85025-CBCD) TLB-Hepatic/Liver Function Pnl (80076-HEPATIC) TLB-TSH (Thyroid Stimulating Hormone) (84443-TSH)  Problem # 3:  HYPERTENSION (ICD-401.9) Assessment: Unchanged  His updated medication list for this problem includes:    Lisinopril 40 Mg Tabs (Lisinopril) .Marland Kitchen... 1 by mouth once daily    Amlodipine  Besylate 5 Mg Tabs (Amlodipine besylate) .Marland Kitchen... Take one tablet by mouth daily    Hydrochlorothiazide 25 Mg Tabs (Hydrochlorothiazide) .Marland Kitchen... 1/2 by mouth once daily  BP today: 112/62 Prior BP: 128/74 (07/10/2010)  Labs Reviewed: K+: 3.9 (10/17/2008) Creat: : 1.0 (10/17/2008)   Chol: 138 (02/07/2008)   HDL: 30.5 (02/07/2008)   LDL: 89 (02/07/2008)   TG: 92 (02/07/2008)  labile per pt hx, may depend on takin gthe hctz or not more than anything; Continue all previous medications as before this visit   Orders: Cardiology Referral (Cardiology)  Problem # 4:  RENAL ARTERY STENOSIS (ICD-440.1) Assessment: New  has known 40-50% stenosis on right at last cath sept 2010, though renal artery u/s was normal 2011; will ask for MRI renal arteries   Orders: Radiology Referral (Radiology)  Problem # 5:  DEPRESSION (ICD-311) Assessment: New  sitauational  - for citalpram 10 once daily   His updated medication list for this problem includes:    Citalopram Hydrobromide 10 Mg Tabs (Citalopram hydrobromide) .Marland Kitchen... 1po once daily  Discussed treatment options, including trial of antidpressant medication  Patient agrees to call if any worsening of symptoms or thoughts of doing harm arise. Verified that the patient has no suicidal ideation at this time.   Complete Medication List: 1)  Gabapentin 300 Mg Caps (Gabapentin) .... Take 1 by mouth three times a day and 2 by mouth at bedtime 2)  Fluticasone Propionate 50 Mcg/act Susp (Fluticasone propionate) .... Use 2 spray once daily each side 3)  Primidone 250 Mg Tabs (Primidone) .... Take 1/2 by mouth three times a day 4)  Lisinopril 40 Mg Tabs (Lisinopril) .Marland Kitchen.. 1 by mouth once daily 5)  Adult Aspirin Low Strength 81 Mg Tbdp (Aspirin) .... Take 1 by mouth qd 6)  Travatan 0.004 % Soln (Travoprost) .Marland Kitchen.. 1 drops both eye qhs 7)  Vitamin D3 1000 Unit Tabs (Cholecalciferol) .Marland Kitchen.. 1 by mouth once daily 8)  Multivitamins Tabs (Multiple vitamin) .Marland Kitchen.. 1 by mouth  once daily 9)  Vitamin C Cr 500 Mg Cr-caps (Ascorbic acid) .Marland Kitchen.. 1 by mouth once daily 10)  Amlodipine Besylate 5 Mg Tabs (Amlodipine  besylate) .... Take one tablet by mouth daily 11)  Simvastatin 40 Mg Tabs (Simvastatin) .Marland Kitchen.. 1 1/2 at bedtime 12)  Zyrtec Allergy 10 Mg Tabs (Cetirizine hcl) .Marland Kitchen.. 1 by mouth once daily 13)  Nitroglycerin 0.4 Mg Subl (Nitroglycerin) .Marland Kitchen.. 1 subl as needed 14)  Calcium 600 600 Mg Tabs (Calcium carbonate) .Marland Kitchen.. 1 by mouth once daily 15)  Hydrochlorothiazide 25 Mg Tabs (Hydrochlorothiazide) .... 1/2 by mouth once daily 16)  Meclizine Hcl 12.5 Mg Tabs (Meclizine hcl) .Marland Kitchen.. 1 - 2 by mouth q 6 hrs as needed dizziness/room going round 17)  Hyoscyamine Sulfate 0.125 Mg Tabs (Hyoscyamine sulfate) .... Take 1 q 4 hrs prn 18)  Anusol-hc 25 Mg Supp (Hydrocortisone acetate) .Marland Kitchen.. 1 per rectum bid 19)  Anamantle Hc 3-0.5 % Crea (Lidocaine-hydrocortisone ace) .... Apply to rectal area bid 20)  Tamsulosin Hcl 0.4 Mg Caps (Tamsulosin hcl) .Marland Kitchen.. 1 by mouth once daily 21)  Omeprazole 20 Mg Cpdr (Omeprazole) .... 2 by mouth once daily 22)  Ranitidine Hcl 300 Mg Tabs (Ranitidine hcl) .Marland Kitchen.. 1 by mouth by mouth at bedtime 23)  Clobetasol Propionate 0.05 % Oint (Clobetasol propionate) .... Apply to affected area once daily 24)  Tylenol With Codeine #3 300-30 Mg Tabs (Acetaminophen-codeine) .Marland Kitchen.. 1 by mouth two times a day as needed 25)  Citalopram Hydrobromide 10 Mg Tabs (Citalopram hydrobromide) .Marland Kitchen.. 1po once daily  Other Orders: TLB-Lipid Panel (80061-LIPID)  Patient Instructions: 1)  Your EKG was ok today 2)  Please take all new medications as prescribed - the new citalopram 10 mg for depression 3)  Continue all previous medications as before this visit 4)  Please go to Radiology in the basement level for your X-Ray today  5)  Please go to the Lab in the basement for your blood and/or urine tests today  6)  Please call the number on the [facility] Card for results of your testing  7)  You  will be contacted about the referral(s) to: Echocardiogram, the MRI test for the renal arteries, 24 hr holter monitor to assess for low heart rate, and Dr Taylor/cardiology 8)  Please schedule a follow-up appointment in 6 months for CPX with labs or sooner if needed Prescriptions: CITALOPRAM HYDROBROMIDE 10 MG TABS (CITALOPRAM HYDROBROMIDE) 1po once daily  #90 x 3   Entered and Authorized by:   Corwin Levins MD   Signed by:   Corwin Levins MD on 09/12/2010   Method used:   Print then Give to Patient   RxID:   [id number]    Orders Added: 1)  EKG w/ Interpretation [93000] 2)  T-2 View CXR, Same Day [71020.5TC] 3)  Radiology Referral [Radiology] 4)  Cardiology Referral [Cardiology] 5)  Misc. Referral [Misc. Ref] 6)  Echo Referral [Echo] 7)  TLB-BMP (Basic Metabolic Panel-BMET) [80048-METABOL] 8)  TLB-CBC Platelet - w/Differential [85025-CBCD] 9)  TLB-Hepatic/Liver Function Pnl [80076-HEPATIC] 10)  TLB-TSH (Thyroid Stimulating Hormone) [84443-TSH] 11)  TLB-Lipid Panel [80061-LIPID] 12)  Est. Patient Level V [14782]

## 2010-09-19 NOTE — Miscellaneous (Signed)
   Clinical Lists Changes  Allergies: Added new allergy or adverse reaction of HYDROCHLOROTHIAZIDE

## 2010-09-20 ENCOUNTER — Ambulatory Visit (HOSPITAL_COMMUNITY): Payer: Medicare Other | Attending: Internal Medicine

## 2010-09-20 ENCOUNTER — Encounter (INDEPENDENT_AMBULATORY_CARE_PROVIDER_SITE_OTHER): Payer: Medicare Other

## 2010-09-20 DIAGNOSIS — I498 Other specified cardiac arrhythmias: Secondary | ICD-10-CM | POA: Insufficient documentation

## 2010-09-20 DIAGNOSIS — R42 Dizziness and giddiness: Secondary | ICD-10-CM

## 2010-09-20 DIAGNOSIS — I701 Atherosclerosis of renal artery: Secondary | ICD-10-CM | POA: Insufficient documentation

## 2010-09-20 DIAGNOSIS — I059 Rheumatic mitral valve disease, unspecified: Secondary | ICD-10-CM | POA: Insufficient documentation

## 2010-09-20 DIAGNOSIS — R079 Chest pain, unspecified: Secondary | ICD-10-CM | POA: Insufficient documentation

## 2010-09-20 DIAGNOSIS — I2510 Atherosclerotic heart disease of native coronary artery without angina pectoris: Secondary | ICD-10-CM | POA: Insufficient documentation

## 2010-09-20 HISTORY — PX: TRANSTHORACIC ECHOCARDIOGRAM: SHX275

## 2010-09-23 ENCOUNTER — Telehealth: Payer: Self-pay | Admitting: Internal Medicine

## 2010-09-23 ENCOUNTER — Encounter: Payer: Self-pay | Admitting: Internal Medicine

## 2010-09-23 ENCOUNTER — Ambulatory Visit (HOSPITAL_COMMUNITY)
Admission: RE | Admit: 2010-09-23 | Discharge: 2010-09-23 | Disposition: A | Discharge status: Home / Self Care | Payer: Medicare Other | Source: Ambulatory Visit | Attending: Internal Medicine | Admitting: Internal Medicine

## 2010-09-23 DIAGNOSIS — I700 Atherosclerosis of aorta: Secondary | ICD-10-CM | POA: Insufficient documentation

## 2010-09-23 DIAGNOSIS — I701 Atherosclerosis of renal artery: Secondary | ICD-10-CM

## 2010-09-23 DIAGNOSIS — R42 Dizziness and giddiness: Secondary | ICD-10-CM | POA: Insufficient documentation

## 2010-09-23 DIAGNOSIS — R5381 Other malaise: Secondary | ICD-10-CM | POA: Insufficient documentation

## 2010-09-23 DIAGNOSIS — I708 Atherosclerosis of other arteries: Secondary | ICD-10-CM | POA: Insufficient documentation

## 2010-09-23 DIAGNOSIS — I10 Essential (primary) hypertension: Secondary | ICD-10-CM | POA: Insufficient documentation

## 2010-09-23 DIAGNOSIS — R5383 Other fatigue: Secondary | ICD-10-CM | POA: Insufficient documentation

## 2010-09-23 DIAGNOSIS — N281 Cyst of kidney, acquired: Secondary | ICD-10-CM | POA: Insufficient documentation

## 2010-09-23 LAB — POCT I-STAT, CHEM 8
BUN: 17 mg/dL (ref 6–23)
Chloride: 103 mEq/L (ref 96–112)
Glucose, Bld: 157 mg/dL — ABNORMAL HIGH (ref 70–99)
HCT: 43 % (ref 39.0–52.0)
Potassium: 3.8 mEq/L (ref 3.5–5.1)

## 2010-09-23 LAB — CBC
Hemoglobin: 14.4 g/dL (ref 13.0–17.0)
MCH: 31.5 pg (ref 26.0–34.0)
MCHC: 35.6 g/dL (ref 30.0–36.0)
MCV: 88.6 fL (ref 78.0–100.0)
Platelets: 221 10*3/uL (ref 150–400)
RBC: 4.57 MIL/uL (ref 4.22–5.81)

## 2010-09-23 LAB — URINE MICROSCOPIC-ADD ON

## 2010-09-23 LAB — DIFFERENTIAL
Basophils Relative: 0 % (ref 0–1)
Lymphocytes Relative: 9 % — ABNORMAL LOW (ref 12–46)
Monocytes Relative: 5 % (ref 3–12)
Neutro Abs: 7.9 10*3/uL — ABNORMAL HIGH (ref 1.7–7.7)
Neutrophils Relative %: 86 % — ABNORMAL HIGH (ref 43–77)

## 2010-09-23 LAB — POCT CARDIAC MARKERS
CKMB, poc: <1 ng/mL — ABNORMAL LOW (ref 1.0–8.0)
Troponin i, poc: <0.05 ng/mL (ref 0.00–0.09)

## 2010-09-23 LAB — URINALYSIS, ROUTINE W REFLEX MICROSCOPIC
Glucose, UA: 100 mg/dL — AB
Leukocytes, UA: NEGATIVE
Protein, ur: 30 mg/dL — AB
Specific Gravity, Urine: 1.015 (ref 1.005–1.030)
pH: 6.5 (ref 5.0–8.0)

## 2010-09-23 MED ORDER — GADOBENATE DIMEGLUMINE 529 MG/ML IV SOLN
20.0000 mL | Freq: Once | INTRAVENOUS | Status: AC | PRN
  Administered 2010-09-23: 20 mL via INTRAVENOUS

## 2010-09-24 ENCOUNTER — Encounter: Payer: Self-pay | Admitting: Internal Medicine

## 2010-09-26 ENCOUNTER — Encounter (INDEPENDENT_AMBULATORY_CARE_PROVIDER_SITE_OTHER): Payer: Self-pay | Admitting: *Deleted

## 2010-09-26 ENCOUNTER — Other Ambulatory Visit: Payer: Medicare Other

## 2010-09-26 ENCOUNTER — Other Ambulatory Visit: Payer: Self-pay | Admitting: Internal Medicine

## 2010-09-26 DIAGNOSIS — E871 Hypo-osmolality and hyponatremia: Secondary | ICD-10-CM

## 2010-09-26 LAB — BASIC METABOLIC PANEL
Calcium: 9.5 mg/dL (ref 8.4–10.5)
Creatinine, Ser: 1 mg/dL (ref 0.4–1.5)
Sodium: 135 mEq/L (ref 135–145)

## 2010-09-30 ENCOUNTER — Encounter: Payer: Self-pay | Admitting: Internal Medicine

## 2010-10-01 NOTE — Progress Notes (Signed)
Summary: New Order req  ---- Converted from flag ---- ---- 09/23/2010 11:09 AM, Margaret Pyle, CMA wrote: [facility] called [phone]), pt is scheduled for scan today and order says MRI Renal but it should be MRA. Radiology is requesting  new order faxed to [phone] ------------------------------   done hardcopy to LIM side B - dahlia Corwin Levins MD  September 23, 2010 11:16 AM   Order faxed to requested number at [facility] Margaret Pyle, CMA  September 23, 2010 11:17 AM

## 2010-10-02 LAB — PROTIME-INR: INR: 1.1 (ref 0.00–1.49)

## 2010-10-02 LAB — DIFFERENTIAL
Lymphocytes Relative: 27 % (ref 12–46)
Lymphs Abs: 1.7 10*3/uL (ref 0.7–4.0)
Monocytes Relative: 10 % (ref 3–12)
Neutro Abs: 3.9 10*3/uL (ref 1.7–7.7)
Neutrophils Relative %: 60 % (ref 43–77)

## 2010-10-02 LAB — POCT I-STAT, CHEM 8
BUN: 13 mg/dL (ref 6–23)
Chloride: 101 mEq/L (ref 96–112)
Potassium: 3.9 mEq/L (ref 3.5–5.1)
Sodium: 136 mEq/L (ref 135–145)
TCO2: 27 mmol/L (ref 0–100)

## 2010-10-02 LAB — CBC
RBC: 4.72 MIL/uL (ref 4.22–5.81)
WBC: 6.4 10*3/uL (ref 4.0–10.5)

## 2010-10-08 ENCOUNTER — Encounter: Payer: Self-pay | Admitting: Internal Medicine

## 2010-10-08 ENCOUNTER — Ambulatory Visit (INDEPENDENT_AMBULATORY_CARE_PROVIDER_SITE_OTHER): Payer: Medicare Other | Admitting: Internal Medicine

## 2010-10-08 DIAGNOSIS — I10 Essential (primary) hypertension: Secondary | ICD-10-CM

## 2010-10-08 DIAGNOSIS — R42 Dizziness and giddiness: Secondary | ICD-10-CM

## 2010-10-08 NOTE — Progress Notes (Signed)
HPI Mr. [name] returns today for followup. He is a pleasant 75 yo man with a h/o chest pain, HTN and dysplipidemia. When I last saw him he was doing well but several weeks ago had an episode of near syncope due to orthostasis. He was found to have a systolic blood pressure in the 50's. He saw his primary MD and had his dose of HCTZ stopped. Since then he has had only one additional episode associated with nausea which prompted him to become horizontal and raise his legs which promptly made his symptoms resolve. He wonders whether head up tilt table is recommended.  Allergies  Allergen Reactions  . Hydrochlorothiazide     REACTION: hyponatremia  . Sulfonamide Derivatives     REACTION: rash     Current Outpatient Prescriptions  Medication Sig Dispense Refill  . acetaminophen-codeine (TYLENOL #3) 300-30 MG per tablet Take 1 tablet by mouth 2 (two) times daily as needed.        Marland Kitchen amLODipine (NORVASC) 5 MG tablet Take 5 mg by mouth daily.        . Ascorbic Acid (VITAMIN C) 500 MG tablet Take 500 mg by mouth daily.        Marland Kitchen aspirin 81 MG tablet Take 81 mg by mouth daily.        . calcium carbonate (OS-CAL) 600 MG TABS Take 600 mg by mouth daily.        . cetirizine (ZYRTEC) 10 MG tablet Take 10 mg by mouth daily.        . Cholecalciferol (VITAMIN D3) 1000 UNITS CAPS Take 1 capsule by mouth daily.        . citalopram (CELEXA) 10 MG tablet Take 10 mg by mouth daily.        . clobetasol (TEMOVATE) 0.05 % ointment Apply topically daily.        . fluticasone (FLONASE) 50 MCG/ACT nasal spray 2 sprays by Nasal route daily.        Marland Kitchen gabapentin (NEURONTIN) 300 MG capsule Take 1 by mouth three times daily and 2 tabs at bedtime       . hydrocortisone (ANUSOL-HC) 25 MG suppository Place 25 mg rectally 2 (two) times daily.        . hyoscyamine (LEVSIN, ANASPAZ) 0.125 MG tablet Take 0.125 mg by mouth every 4 (four) hours as needed.        . lidocaine-hydrocortisone (ANAMANTLE HC) 3-0.5 % CREA Place 1  Applicatorful rectally as directed.        Marland Kitchen lisinopril (PRINIVIL,ZESTRIL) 40 MG tablet Take 40 mg by mouth daily.        . meclizine (ANTIVERT) 12.5 MG tablet Take 12.5 mg by mouth 3 (three) times daily as needed.        . Multiple Vitamin (MULTIVITAMIN) tablet Take 1 tablet by mouth daily.        . nitroGLYCERIN (NITROSTAT) 0.4 MG SL tablet Place 0.4 mg under the tongue every 5 (five) minutes as needed.        Marland Kitchen omeprazole (PRILOSEC) 20 MG capsule Take 40 mg by mouth daily.        . primidone (MYSOLINE) 250 MG tablet Take 125 mg by mouth 3 (three) times daily.        . ranitidine (ZANTAC) 300 MG capsule Take 300 mg by mouth every evening.        . simvastatin (ZOCOR) 40 MG tablet Take 60 mg by mouth at bedtime.        Marland Kitchen  Tamsulosin HCl (FLOMAX) 0.4 MG CAPS Take 0.4 mg by mouth daily.        . travoprost, benzalkonium, (TRAVATAN) 0.004 % ophthalmic solution Place 1 drop into both eyes at bedtime.        Marland Kitchen DISCONTD: hydrochlorothiazide 25 MG tablet Take 12.5 mg by mouth daily.           Past Medical History  Diagnosis Date  . Other chronic nonalcoholic liver disease   . Diverticulosis of colon (without mention of hemorrhage)   . Gastritis   . Esophagitis   . Abdominal tenderness, epigastric   . Bradycardia   . Chest pain   . Dizziness   . Hepatotoxicity     drug induced  . Bladder cancer   . Cough   . Colonic polyp   . Allergic rhinitis   . Renal insufficiency   . CAD (coronary artery disease)   . CHF (congestive heart failure)   . Lumbar disc disease   . PUD (peptic ulcer disease)   . Hiatal hernia     with reflux  . Rheumatoid arthritis   . HTN (hypertension)   . HLD (hyperlipidemia)   . GERD (gastroesophageal reflux disease)   . Depression     ROS:   All systems reviewed and negative except as noted in the HPI.   Past Surgical History  Procedure Date  . Adenoidectomy   . Coronary angioplasty     percutaneious transluminal  . Tonsillectomy   . Hip arthroplasty  2002    left  . Bladder surgery   . Lumbar disc surgery 1/08  . Transurethral resection of bladder tumor      Family History  Problem Relation Age of Onset  . Heart attack Mother   . Cancer Father     lung  . Cancer Sister     lung  . Diabetes Sister   . Cancer Brother     lung that spread to brain     History   Social History  . Marital Status: Married    Spouse Name: N/A    Number of Children: N/A  . Years of Education: N/A   Occupational History  . Not on file.   Social History Main Topics  . Smoking status: Former Smoker -- 1.5 packs/day for 15 years    Types: Cigarettes    Quit date: 10/07/1985  . Smokeless tobacco: Never Used  . Alcohol Use: Not on file  . Drug Use: Not on file  . Sexually Active: Not on file   Other Topics Concern  . Not on file   Social History Narrative  . No narrative on file     BP 110/62  Pulse 66  Ht 5\' 7"  (1.702 m)  Wt 183 lb 4 oz (83.122 kg)  BMI 28.70 kg/m2  Physical Exam:  Well appearing NAD HEENT: Unremarkable Neck:  No JVD, no thyromegally Lymphatics:  No adenopathy Back:  No CVA tenderness Lungs:  Clear with no wheezes, rales, or rhonchi HEART:  Regular rate rhythm, no murmurs, no rubs, no clicks Abd:  Flat, positive bowel sounds, no organomegally, no rebound, no guarding Ext:  2 plus pulses, no edema, no cyanosis, no clubbing Skin:  No rashes no nodules Neuro:  CN II through XII intact, motor grossly intac  Assess/Plan:

## 2010-10-08 NOTE — Assessment & Plan Note (Signed)
Today, orthostatic vitals were obtained which demonstrated no significant change in BP and no change in HR. I have asked him to reduce his dose of lisinopril as I have found that patients with orthostasis will often do better with there blood pressures that are a bit on the high side. Also, when he feels a spell coming on he has been encouraged to lie down.

## 2010-10-08 NOTE — Patient Instructions (Signed)
Your physician recommends that you continue on your current medications as directed. Please refer to the Current Medication list given to you today.  Your physician recommends that you schedule a follow-up appointment in: 6 MONTHS  

## 2010-10-08 NOTE — Assessment & Plan Note (Signed)
His blood pressure is well controlled. I have asked that he reduce his dose of lisinopril.

## 2010-10-10 NOTE — Procedures (Signed)
Summary: summary report  summary report   Imported By: Mirna Mires 09/30/2010 15:47:53  _____________________________________________________________________  External Attachment:    Type:   Image     Comment:   External Document

## 2010-10-14 ENCOUNTER — Other Ambulatory Visit: Payer: Self-pay

## 2010-10-14 MED ORDER — FLUTICASONE PROPIONATE 50 MCG/ACT NA SUSP: 2.0000 | Freq: Every day | NASAL | Status: DC

## 2010-10-18 LAB — URINALYSIS, ROUTINE W REFLEX MICROSCOPIC
Glucose, UA: NEGATIVE mg/dL
Protein, ur: NEGATIVE mg/dL
pH: 6.5 (ref 5.0–8.0)

## 2010-10-18 LAB — PROTIME-INR: Prothrombin Time: 14.3 seconds (ref 11.6–15.2)

## 2010-10-18 LAB — CARDIAC PANEL(CRET KIN+CKTOT+MB+TROPI)
CK, MB: 3.4 ng/mL (ref 0.3–4.0)
Troponin I: 0.02 ng/mL (ref 0.00–0.06)

## 2010-10-19 LAB — CBC
HCT: 38.6 % — ABNORMAL LOW (ref 39.0–52.0)
MCV: 90.5 fL (ref 78.0–100.0)
Platelets: 228 10*3/uL (ref 150–400)
RDW: 12.1 % (ref 11.5–15.5)

## 2010-10-19 LAB — DIFFERENTIAL
Eosinophils Absolute: 0.2 10*3/uL (ref 0.0–0.7)
Eosinophils Relative: 3 % (ref 0–5)
Lymphs Abs: 1.7 10*3/uL (ref 0.7–4.0)
Monocytes Absolute: 0.5 10*3/uL (ref 0.1–1.0)
Monocytes Relative: 9 % (ref 3–12)

## 2010-10-19 LAB — BASIC METABOLIC PANEL
BUN: 11 mg/dL (ref 6–23)
GFR calc non Af Amer: >60 mL/min (ref >60)
Glucose, Bld: 104 mg/dL — ABNORMAL HIGH (ref 70–99)
Potassium: 3.7 mEq/L (ref 3.5–5.1)

## 2010-10-19 LAB — HEPATIC FUNCTION PANEL
ALT: 20 U/L (ref 0–53)
Alkaline Phosphatase: 70 U/L (ref 39–117)
Bilirubin, Direct: <0.1 mg/dL (ref 0.0–0.3)
Total Bilirubin: 0.4 mg/dL (ref 0.3–1.2)

## 2010-10-19 LAB — CK TOTAL AND CKMB (NOT AT ARMC): CK, MB: 1.9 ng/mL (ref 0.3–4.0)

## 2010-10-24 LAB — BASIC METABOLIC PANEL
BUN: 10 mg/dL (ref 6–23)
CO2: 25 mEq/L (ref 19–32)
Chloride: 107 mEq/L (ref 96–112)
Glucose, Bld: 146 mg/dL — ABNORMAL HIGH (ref 70–99)
Potassium: 3.1 mEq/L — ABNORMAL LOW (ref 3.5–5.1)
Sodium: 139 mEq/L (ref 135–145)

## 2010-10-24 LAB — URINALYSIS, ROUTINE W REFLEX MICROSCOPIC
Bilirubin Urine: NEGATIVE
Glucose, UA: NEGATIVE mg/dL
Hgb urine dipstick: NEGATIVE
Ketones, ur: NEGATIVE mg/dL
Nitrite: NEGATIVE
Specific Gravity, Urine: 1.009 (ref 1.005–1.030)
pH: 7 (ref 5.0–8.0)

## 2010-10-24 LAB — CARDIAC PANEL(CRET KIN+CKTOT+MB+TROPI)
CK, MB: 1.6 ng/mL (ref 0.3–4.0)
CK, MB: 1.6 ng/mL (ref 0.3–4.0)
Relative Index: INVALID (ref 0.0–2.5)
Total CK: 79 U/L (ref 7–232)
Total CK: 89 U/L (ref 7–232)
Troponin I: <0.01 ng/mL (ref 0.00–0.06)

## 2010-10-24 LAB — DIFFERENTIAL
Eosinophils Absolute: 0.1 10*3/uL (ref 0.0–0.7)
Lymphocytes Relative: 16 % (ref 12–46)
Lymphs Abs: 1.3 10*3/uL (ref 0.7–4.0)
Monocytes Relative: 7 % (ref 3–12)
Neutrophils Relative %: 76 % (ref 43–77)

## 2010-10-24 LAB — POCT CARDIAC MARKERS
Myoglobin, poc: 59.9 ng/mL (ref 12–200)
Troponin i, poc: <0.05 ng/mL (ref 0.00–0.09)

## 2010-10-24 LAB — CBC
MCHC: 35 g/dL (ref 30.0–36.0)
RDW: 12.5 % (ref 11.5–15.5)

## 2010-10-24 LAB — COMPREHENSIVE METABOLIC PANEL
ALT: 28 U/L (ref 0–53)
AST: 27 U/L (ref 0–37)
Calcium: 9.2 mg/dL (ref 8.4–10.5)
Creatinine, Ser: 0.97 mg/dL (ref 0.4–1.5)
GFR calc Af Amer: >60 mL/min (ref >60)
Glucose, Bld: 110 mg/dL — ABNORMAL HIGH (ref 70–99)
Sodium: 133 mEq/L — ABNORMAL LOW (ref 135–145)
Total Protein: 6.8 g/dL (ref 6.0–8.3)

## 2010-10-24 LAB — URINE CULTURE

## 2010-11-26 NOTE — Cardiovascular Report (Signed)
NAME:  NYHEIM, SEUFERT                  ACCOUNT NO.:  [id number]   MEDICAL RECORD NO.:  [id number]          PATIENT TYPE:  OIB   LOCATION:  1965                         FACILITY:  [facility]   PHYSICIAN:  Veverly Fells. Excell Seltzer, MD  DATE OF BIRTH:  [birth year]   DATE OF PROCEDURE:  02/12/2007  DATE OF DISCHARGE:                            CARDIAC CATHETERIZATION   PROCEDURE:  Left heart catheterization with right heart catheterization,  selective coronary angiography, left ventricular angiography.   INDICATIONS:  Mr. Schliep is a 75 year old gentleman who has been followed  by Dr. Ladona Ridgel.  He has had persistent chest pain and shortness of breath  despite a negative noninvasive evaluation.  He was referred for a right  and left cardiac catheterization for further evaluation.   Risks and indications of procedure were explained to the patient.  Informed consent was obtained.  Using the modified Seldinger technique,  a 6-French sheath was placed in the right femoral vein and a 4-French  sheath was placed in the right femoral artery.  At the time of arterial  access, the patient had a profound vagal reaction where he became  markedly bradycardic with a heart rate in the 20s and with marked  hypotension.  He did not develop nausea or vomiting with that.  He was  given 1 mg of atropine and responded well and had no further problems  during the procedure.  Left heart catheterization was performed first  with selective coronary angiography followed by left ventricular  angiography with an angled pigtail catheter.  Pressures were recorded  and a pullback was done.  The right heart catheterization was performed  using standard technique with an end-hole multipurpose catheter.  Fick  cardiac output was calculated and pressures were recorded throughout the  right heart chambers.  Oxygen saturations were drawn from the pulmonary  artery and aorta.  There were no immediate complications.   FINDINGS:  Right atrial  pressure mean of 15, right ventricular pressures  38/16.  Pulmonary artery pressures 36/17 with mean of 26.  Pulmonary  capillary wedge pressure is 17, left ventricular pressure is 90/54 with  a mean of 68, aortic pressure is 88/16.  Oxygen saturation with  pulmonary artery 74%, aorta is 93%.   Cardiac output is 5.9 L/minute.  Cardiac index 2.9 L/sq m per minute.   Coronary angiography:  The left mainstem is angiographically normal and  it bifurcates into the LAD and left circumflex.   The LAD is a large-caliber vessel that courses down and reaches the LV  apex.  There is a large first diagonal branch present.  The second  diagonal branch is medium-sized.  The proximal mid LAD has minor luminal  irregularities and no greater than 20%.  There are no severe stenoses.  There is no stenosis of the diagonal branches.   The left circumflex is dominant.  The proximal circumflex has 20%  stenosis with minor luminal irregularity.  There is a very small first  OM branch and a medium-size second OM branch.  The AV groove circumflex  remains a large-caliber vessel into its distal  aspect and terminates in  the left PDA and posterolateral branch.  There is no significant  angiographic disease throughout the remaining portions of the  circumflex.   The right coronary artery is small and nondominant.  There is no  significant angiographic disease.   Left ventricular function assessed by ventriculography is normal with an  LVEF of 65%.   ASSESSMENT:  1. Nonobstructive coronary artery disease.  2. Normal left ventricular function.  3. Mild elevation of right heart pressures, specifically elevated      pressure during diastole.   PLAN:  Mr. Opdahl was reassured regarding his coronary status.  He has no  significant obstructive disease.  With his persistent symptoms, it may  be worthwhile to perform a cardiac MR to better evaluate his right heart  and pericardium to make sure that there is no  significant noncoronary  problem as an etiology of his elevated, right-sided pressures.      Veverly Fells. Excell Seltzer, MD  Electronically Signed     MDC/MEDQ  D:  02/12/2007  T:  02/12/2007  Job:  [id number]   cc:   Doylene Canning. Ladona Ridgel, MD  Corwin Levins, MD

## 2010-11-26 NOTE — Assessment & Plan Note (Signed)
[facility]                         ELECTROPHYSIOLOGY OFFICE NOTE   [name], [name]                         MRN:          [id number]  DATE:03/24/2008                            DOB:          02-12-1936    [name] returns today for followup.  He is a very pleasant 75 year old  male with a history of diastolic heart failure and as well as mild renal  insufficiency, dyslipidemia, and gastroesophageal reflux disease.  The  patient returns today for followup.  He has continued to do well.  He is  working 5 days a week.  He walks on a relatively regular basis though  much of it is while he works.  He denies chest pain or shortness breath.  He has not been hospitalized in the last year.   CURRENT MEDICATIONS:  1. Omeprazole 40 a day.  2. Gabapentin 300 t.i.d.  3. Fluticasone 50 mcg 2 puffs daily.  4. Lisinopril 40 mg half tablet daily.  5. Simvastatin 40 a day.  6. Aspirin 81 a day.  7. Travatan drops.  8. Ranitidine 300 a day nightly.  9. Zyrtec 10 mg daily.  10.He is now on a beta-blocker secondary to fatigue.   PHYSICAL EXAMINATION:  GENERAL:  He is a pleasant well-appearing 72-year-  old man in no distress.  VITAL SIGNS:  Blood pressure 123/76.  The pulse is 60 and regular.  Respirations were 18.  The weight was 199 pounds.  NECK:  No jugular distention.  LUNGS:  Clear bilaterally to auscultation.  No wheezes, rales, or  rhonchi are present.  CARDIOVASCULAR:  Regular rate and rhythm.  Normal S1 and S2.  ABDOMEN:  Soft and nontender.  EXTREMITIES:  No edema.   EKG demonstrates sinus bradycardia.   IMPRESSION:  1. Diastolic heart failure, currently well controlled.  2. Hypertension, also well controlled.  3. Dyslipidemia, on simvastatin.   DISCUSSION:  Overall, [name] is stable.  Because he has done so well,  I have recommended that he follow up with his primary physician who is  Dr. Jonny Ruiz in our practice.  I will see him back  on a  p.r.n. basis.  I have encouraged him to continue walking regularly,  to maintain a low-sodium diet, and to keep his blood pressure under  control.     Casey Canning. Ladona Ridgel, MD  Electronically Signed    GWT/MedQ  DD: 03/24/2008  DT: 03/24/2008  Job #: [id number]

## 2010-11-26 NOTE — Letter (Signed)
January 11, 2007    Corwin Levins, MD  [address]  [place], [place] [address]   RE:  ORONDE, HALLENBECK  MRN:  [id number]  /  DOB:  1935/10/13   Dear Rosanne Ashing:   Thank you for referring Mr. Aston for EP evaluation.  As you know he is a  very pleasant 75 year old male who was admitted to the hospital back in  April with some orthostasis and renal insufficiency, thought to be  secondary to dehydration.  He also has atypical chest pain and underwent  adenosine stress testing several weeks ago which demonstrated no obvious  ischemia.  A 2-D echo demonstrated low normal LV function with no other  significant abnormalities.  He is now referred for evaluation.  The  patient's main complaint today is that of dizziness, as well as  substernal chest discomfort which is not related to exertion.  He also  has generalized weakness and pain in his head and neck.  His history is  not particularly helpful in that I do not get any history of frank  syncope or severe palpitations.  There is a component of his chest  pressure is related to exertion, though not reproducibly.   PHYSICAL EXAMINATION:  Essentially normal and orthostatic vitals did not  demonstrate orthostasis.   His EKG also was normal.   The etiology of the patient's symptoms are still unclear to me.  He may  have some autonomic dysfunction with increases as well as decreases in  his blood pressure.  The chest discomfort is still somewhat bothersome  despite a negative adenosine stress test.  Because his EKG is normal I  think we might get some functional information by having him undergo  exercise treadmill testing and I have scheduled this to be carried out  in the next week or two.  I will see him back based on this.  We  recommend a period of watchful waiting with frequent blood pressure  checks at home and then follow up after this.  Obviously if he had  evidence of ST  segment changes with the treadmill test and/or exercise induced  ischemia, I would recommend catheterization.   Rosanne Ashing, thanks for referring this patient for EP evaluation.    Sincerely,      Doylene Canning. Ladona Ridgel, MD  Electronically Signed    GWT/MedQ  DD: 01/11/2007  DT: 01/12/2007  Job #: [id number]

## 2010-11-26 NOTE — Cardiovascular Report (Signed)
NAME:  [name], [name]                  ACCOUNT NO.:  [id number]   MEDICAL RECORD NO.:  [id number]          PATIENT TYPE:  INP   LOCATION:  3713                         FACILITY:  [facility]   PHYSICIAN:  Noralyn Pick. Eden Emms, MD, FACCDATE OF BIRTH:  1935-12-05   DATE OF PROCEDURE:  DATE OF DISCHARGE:  03/14/2009                            CARDIAC CATHETERIZATION   INDICATIONS:  Chest pain.  Distal aorta and renals were done since the  patient has severe labile hypertension.   Cine catheterization done with 5-French catheters from right femoral  artery.  At the end of the case, the arteriotomy was closed using Angio-  Seal with good hemostasis.   Left main coronary artery was normal.   Left anterior descending artery had 20-30% multiple lesions in the mid  and distal vessel.  The LAD was large and wrapped the apex.  First  diagonal branch had 20-30% multiple lesions.   The patient had a left dominant coronary circulation.  Proximal mid and  distal circ were normal.  The first and second obtuse marginal branches  were small vessels with 20-30% multiple lesions.  There were 2 posterior  lateral branches with 20-30% multiple lesions.   Right coronary artery was nondominant and normal.   RAO ventriculography:  ROA ventriculography was normal.  EF was 60%.  There was no gradient across the aortic valve and no MR.  Aortic  pressure was 132/73.  LV pressure was 130/12.   Selective injection of the renals showed 40-50% ostial lesion in the  right renal artery.  The left renal artery was large and widely patent.  Distal aortography showed no significant abdominal aortic aneurysm, no  significant plaque.   RAO ventriculography showed normal LV function with an EF of 60%.  No  regional wall motion abnormalities.   IMPRESSION:  The patient's chest pain would appear to be noncardiac in  etiology.  His blood pressure was actually quite good in the lab.  I did  not believe the ostial lesion in the  right renal artery should be flow  limiting.  He will continue his current blood pressure medications.  He  will be discharged later today if his groin looks good.  He will follow  up with Dr. Ladona Ridgel or Dr. Juanda Chance in the office.  He should have a renal  duplex in 6 months to further assess the physiological significance of  the ostial right stenosis.      Noralyn Pick. Eden Emms, MD, [facility]  Electronically Signed    PCN/MEDQ  D:  03/14/2009  T:  03/15/2009  Job:  [id number]   cc:   Everardo Beals. Juanda Chance, MD, [facility]

## 2010-11-26 NOTE — H&P (Signed)
[name], [name]                  ACCOUNT NO.:  [id number]   MEDICAL RECORD NO.:  [id number]          PATIENT TYPE:  INP   LOCATION:  3703                         FACILITY:  [facility]   PHYSICIAN:  Casey El, MD    DATE OF BIRTH:  04-May-1936   DATE OF ADMISSION:  03/13/2009  DATE OF DISCHARGE:                              HISTORY & PHYSICAL   CHIEF COMPLAINT:  Chest pain.   HISTORY OF PRESENT ILLNESS:  [name] is a 75 year old white male with  past medical history significant for nonobstructive coronary disease,  hypertension and hyperlipidemia presenting with new-onset chest  discomfort.  The patient states that today while walking his dog he had  acute onset of substernal chest pain radiating to his left arm and left  shoulder associated with some shortness of breath.  The patient states  that the pain was not exacerbated or alleviated by anything until he  arrived emergency room where the pain was completely resolved with  sublingual nitroglycerin.  The patient denies any history of similar  chest discomfort.  The patient does state for the past month or so he  has had increased generalized fatigue, weakness and dizziness.  He  states that the dizziness can occur sometimes when he is lying down and  other times when he is standing or walking.  He endorses one episode of  falling during one of these dizzy episodes, but denies any frank  syncope.  He has had no recent changes of his medication except for the  addition of amlodipine several months ago for hypertensive control.   PAST MEDICAL HISTORY:  1. Nonobstructive coronary artery disease.  Left heart catheterization      in August 2008, showed proximal LAD of less than 20%, proximal circ      of 20%.  It was a left dominant system.  His ejection fraction was      65%.  2. Hypertension.  3. Hyperlipidemia.  4. Diastolic dysfunction.  5. Gastroesophageal reflux disease.  6. History of lumbar surgery.  7. History of  TURP.  8. Restless leg syndrome.  9. He is status post left hip arthroplasty.   SOCIAL HISTORY:  Tobacco forty-pack years, quit approximately 20 years  ago.  No alcohol use.   FAMILY HISTORY:  Positive for premature coronary disease.   ALLERGIES:  SULFA.   MEDICATIONS:  1. Aspirin 81 mg daily.  2. Amlodipine 5 mg daily.  3. Lisinopril 40 mg daily.  4. Zocor 60 mg nightly.  5. Ranitidine 300 mg nightly.  6. Gabapentin 300 mg t.i.d.  7. Calcium.  8. Zyrtec.   REVIEW OF SYSTEMS:  As in HPI.  All other systems were reviewed and are  negative.   PHYSICAL EXAMINATION:  VITAL SIGNS:  Temperature 98.4, pulse 48 upon  admission to the emergency room, however, during my examination his  pulse is in the low 40s.  Respiratory rate 15, blood pressure 120/60,  saturating 99% on room air.  GENERAL:  No acute distress.  HEENT:  Normocephalic, atraumatic, nonfocal.  Cranial nerves II-XII  grossly intact.  NECK:  Supple.  There is no JVD.  There is no lymphadenopathy.  There  are no bruits auscultated.  HEART:  Regular rate and rhythm but very  distant heart sounds.  No murmur, rub or gallop.  LUNGS:  Clear bilaterally.  ABDOMEN:  Soft, nontender, nondistended.  EXTREMITIES:  Without edema.  SKIN:  Warm and dry.  MUSCULOSKELETAL:  5/5 bilateral upper and lower extremity strength.  NEURO:  Nonfocal.  PSYCHIATRIC:  Patient is appropriate with normal levels of insight.   LABORATORY DATA:  Sodium 138, potassium 3.7, chloride 107, CO2 of 25,  BUN 11, creatinine 0.9, glucose 104.  White count 5.5, hemoglobin 14,  hematocrit 39, platelets 229.  His LFTs are within normal limits.  His  CK was 89, his MB was 1.9, troponin 0.02.  He has a lipase of 30.  Chest  x-ray showed of some peribronchial thickening.  EKG independently  interpreted by myself demonstrates sinus bradycardia with a rate of 46  beats per minute.  There were no significant ST or T-wave abnormalities.  Compared with EKG dated  October 07, 2008, his ventricular rate has  decreased by 7 beats per minute.   ASSESSMENT:  A 75 year old white male with a history of nonobstructive  coronary disease presenting with chest discomfort and recent dizziness.  The chest discomfort is worrisome for development of obstructive  coronary disease.  However, it also may be a symptom of bradycardia.  The patient's dizziness is also worrisome for symptomatic bradycardia at  home.   PLAN:  The patient will be admitted to a telemetry unit and ruled out  for acute myocardial infarction.  We will continue his medications as  listed above with the exception of increasing his aspirin to 162 mg  daily and placing him on nitroglycerin p.r.n. for chest discomfort.  We  will not give him a beta-blocker as he is bradycardic.  The patient will  benefit from an ischemic workup in the form of left heart  catheterization or a stress test.  We will monitor the patient's heart  rate as he may eventually need a pacemaker for what potentially may be  symptomatic bradycardia.  The patient may benefit from an exercise  treadmill in order to gauge his chronotropic competence.  Lastly, we  will place the patient on Lovenox 40 mg [place] daily for DVT prophylaxis.      Casey El, MD  Electronically Signed     SGA/MEDQ  D:  03/14/2009  T:  03/14/2009  Job:  [id number]

## 2010-11-26 NOTE — Discharge Summary (Signed)
NAME:  [name], [name]                  ACCOUNT NO.:  [id number]   MEDICAL RECORD NO.:  [id number]          PATIENT TYPE:  INP   LOCATION:  3713                         FACILITY:  [facility]   PHYSICIAN:  Noralyn Pick. Eden Emms, MD, FACCDATE OF BIRTH:  December 26, 1935   DATE OF ADMISSION:  03/13/2009  DATE OF DISCHARGE:  03/14/2009                               DISCHARGE SUMMARY   PRIMARY CARDIOLOGIST:  Doylene Canning. Ladona Ridgel, MD   PRIMARY CARE PHYSICIAN:  Corwin Levins, MD   PROCEDURES PERFORMED DURING HOSPITALIZATION:  1. Cardiac catheterization completed by Dr. Charlton Haws on March 14, 2009.      a.     Left main normal, LAD 20%, proximal and mid diagonal 20-30%,       circ large dominant, OM1 20%, RCA small, nondominant, normal LV,       normal EF at 60%.  AO 120/58 and LV 129/6.  Aorta, no AAA.  Left       renal normal.  Right renal 40-50% ostial.      b.     Nonobstructive coronary artery disease, labile hypertension,       followup renal duplex in 6 months for right renal artery stenosis.   FINAL DISCHARGE DIAGNOSES:  1. Chest pain.  2. Bradycardia.  3. Nonobstructive coronary artery disease.  A,  Status post cardiac catheterization dated March 14, 2009,  revealing nonobstructive coronary artery disease in the left anterior  descending diagonal and first obtuse marginal artery.  1. Hypertension, labile.  2. Hyperlipidemia.  3. Diastolic dysfunction.  4. Gastroesophageal reflux disease.  5. Restless legs syndrome.   HOSPITAL COURSE:  This is a 75 year old Caucasian male with known  history of nonobstructive CAD, hypertension, and hyperlipidemia, who  presented to the emergency room with new-onset chest discomfort while  walking his dog.  The patient states the pain radiated to his left arm  and left shoulder with associated shortness of breath.  The pain was not  exacerbated or alleviated by anything until he arrived at the emergency  room where it was resolved with sublingual  nitroglycerin.  The patient  did have some prior recent history of some dizziness when he was lying  down and other times with standing or walking and he does admit to one  episode of falling during one of these dizzy spells, but this has been  not recent.   The patient was admitted to rule out myocardial infarction in the  setting of nonobstructive CAD.  He was placed on heparin and also  monitored cardiac enzymes on telemetry.  The patient does have some mild  bradycardia on admission EKG with a heart rate at 42 beats per minute.  The patient is not on any rate-reducing medications prior to admission.   Cardiac enzymes were cycled and found to be negative throughout  hospitalization, however, with recurrent chest pain with no  nonobstructive CAD.  The patient did undergo cardiac catheterization on  March 14, 2009, with results as described above.  The patient  tolerated the procedure well without evidence of  bleeding or hematoma  postprocedure.  The patient was also found to have some renal artery  stenosis in the ostial right renal artery of 40-50%.  It was recommended  per Dr. Eden Emms that the patient have a followup renal ultrasound in 6  months for further evaluation.  The patient was seen and examined by Dr.  Eden Emms postprocedure and found to be stable for discharge.  The patient  will follow up with Dr. Lewayne Bunting and discussion for the patient's  renal artery ultrasound followup in 6 months per Dr. Ladona Ridgel will be  recommended and monitoring possibly as an outpatient for persistent  bradycardia for pauses or irregularities and arrhythmias.   DISCHARGE LABORATORIES:  Troponin 0.02, 0.02, and 0.02 respectively.  BMET:  Sodium 138, potassium 3.7, chloride 107, CO2 of 25, glucose 104,  BUN 11, and creatinine 0.92.  Hemoglobin 13.7, hematocrit 38.6, white  blood cells 5.5, and platelets 228.  Urinalysis negative for UTI.  Lipase 30.  Hepatic function panel was found to be normal  with mild low  protein at 5.9, otherwise all within normal limits.  Chest x-ray dated  March 13, 2009, revealing bronchitic changes and low lung volumes.  EKG  revealing sinus bradycardia, ventricular rate of 42 beats per minute  without pauses or blocks noted.   DISCHARGE MEDICATIONS:  1. Nitroglycerin 0.4 mg sublingual tablets p.r.n.  2. Amlodipine 5 mg daily.  3. Aspirin 81 mg daily.  4. Calcium 600 mg daily.  5. Gabapentin 300 mg b.i.d.  6. Lisinopril 40 mg daily.  7. Multivitamin daily.  8. Omeprazole 20 mg daily.  9. Primidone 250 mg t.i.d.  10.Ranitidine 300 mg at bedtime.  11.Simvastatin 40 mg at bedtime.  12.Travatan Z  1 drop both eyes daily.  13.Vitamin C 500 mg daily.  14.Vitamin D3 2000 units daily.  15.Zyrtec 10 mg daily.   ALLERGIES:  SULFA causing a rash.   FOLLOWUP PLANS AND APPOINTMENT:  1. The patient will follow up with Dr. Gilman Schmidt on September 29      at 9:30 a.m. in the office.  2. The patient has been given postcardiac catheterization instructions      with particular emphasis on the right groin      site for evidence of bleeding, hematoma, or signs of infection.  3. The patient has been provided with a new prescription for      nitroglycerin sublingual to take p.r.n.  4. The patient has been advised to bring all medications to follow up      appointment with Dr. Ladona Ridgel.      Bettey Mare. Lyman Bishop, NP      Noralyn Pick. Eden Emms, MD, [facility]  Electronically Signed    KML/MEDQ  D:  03/14/2009  T:  03/15/2009  Job:  [id number]   cc:   Corwin Levins, MD

## 2010-11-26 NOTE — Procedures (Signed)
[facility]                              EXERCISE TREADMILL   ZARED, KNOTH                         MRN:          [id number]  DATE:02/05/2007                            DOB:          10/12/1935    PRIMARY CARE PHYSICIAN:  Dr. Oliver Barre.   CARDIOLOGIST:  Dr. Lewayne Bunting.   HISTORY:  Mr. [name] is a 75 year old male patient with a history of  hypertension, hyperlipidemia, ex-smoker with family history of coronary  disease, who was hospitalized back in March with a near syncopal episode  and bradycardia.  He was orthostatic at the time and had some  hyponatremia as well as acute renal insufficiency.  It was felt that he  was dehydrated.  He did have an adenosine nuclear study performed in  April that showed no ischemia.  He was seen in follow up with Dr. Ladona Ridgel  back on June 30th.  The patient noted occasional bouts of chest pain,  dizziness and light headedness.  He was set up for an exercise treadmill  test to assess his functional capacity utilizing the Bruce protocol.   EXERCISE TREADMILL TEST:  The patient exercised according to Bruce  protocol for 7 minutes and 42 seconds and achieved a work level of 9.6  METS.  His resting heart rate rose from 74 beats per minute to 122 beats  per minute.  This value represented 80% of his maximal age predicted  heart rate.  His blood pressure went from 126/78 to a maximum of 198/74.  The exercise test was stopped secondary to his significant left shoulder  pain and shortness of breath.   ELECTROCARDIOGRAM AT BASELINE:  The patient had sinus rhythm with a  heart rate of 69, normal axis with ST flattening in II, III and aVF as  well as V5 and V6.  At maximal exercise he had worsening flattening in  II, III and aVF and V5 and 6.  There was no clear cut flat ST segment  depression in any of the leads.  No arrhythmias were noted.  No T wave  inversions were noted.   IMPRESSION:  1. Clinically positive  2.  Electrically equivocal  3. Submaximal exercise treadmill test.  4. Appropriate blood pressure response to exercise.   DISPOSITION:  The patient did have continued left shoulder pain and some  chest tightness at the end of the test.  He has been experiencing this  over the past couple of months.  There may have been a change in some of  his symptoms over the last several weeks.  Symptoms he experienced in  the office today were no different from what he has experienced at home.  I did have him raise his left arm above his head and this exacerbated  his left shoulder pain somewhat.  He was fairly close to baseline by the  time he was discharged from the office.  His blood pressure and heart  rate both returned to normal fairly quickly post exercise.  In recovery  he had no significant EKG changes.   I discussed  the patient's case today with Dr. Diona Browner.  We feel he  needs further follow up with Dr. Ladona Ridgel.  We were able to get him set up  to see Dr. Ladona Ridgel Monday, February 08, 2007 to follow up on his stress  testing.  Further recommendations will be per Dr. Ladona Ridgel after his  reassessment of the patient.      Tereso Newcomer, PA-C  Electronically Signed      Jonelle Sidle, MD  Electronically Signed   SW/MedQ  DD: 02/05/2007  DT: 02/06/2007  Job #: [id number]   cc:   Corwin Levins, MD  Doylene Canning. Ladona Ridgel, MD

## 2010-11-26 NOTE — Consult Note (Signed)
NAMEAUSTYN, [name]                  ACCOUNT NO.:  [id number]   MEDICAL RECORD NO.:  [id number]          PATIENT TYPE:  INP   LOCATION:  4734                         FACILITY:  [facility]   PHYSICIAN:  Marca Ancona, MD      DATE OF BIRTH:  [birth year]   DATE OF CONSULTATION:  10/07/2008  DATE OF DISCHARGE:  10/07/2008                                 CONSULTATION   PRIMARY CARDIOLOGIST:  Doylene Canning. Ladona Ridgel, MD   PRIMARY CARE PHYSICIAN:  Corwin Levins, MD   REASON FOR CONSULTATION:  Chest pain/dizziness.   HISTORY OF PRESENT ILLNESS:  This 75 year old male with a history of  nonobstructive coronary artery disease (per cardiac cath February 12, 2007), hypertension, hyperlipidemia, diastolic CHF, and recently told no  need for further workup by his cardiologist, Dr. Sharrell Ku, presenting  with 2 episodes of palpitations and dizziness and 1 episode of  associated chest pain that was brief and nonradiating.  The patient  reports last week brief roughly 10-minute episode of dizziness with some  tachy palpitations, it came on while he was digging a hole.  The patient  denies chest pain, nausea, vomiting, shortness of breath, or  diaphoresis.  Resolution with rest.  Yesterday, however, the patient had  similar symptoms that lasted 1 hour and associated with 1-2 minutes of  4/10 left-sided chest pain that was nonradiating.  The patient also  reports diaphoresis and some difficulty with speech, although he denies  confusion.  Currently, the patient has mild dizziness with sitting up or  standing quickly only.  No palpitations.   PAST MEDICAL HISTORY:  1. Hypertension.  2. Hyperlipidemia.  3. CHF (diastolic, well controlled per Cardiology note from March 24, 2008).  4. Coronary artery disease (nonobstructive with normal LV function per      cath August 2008.  5. Chronic renal insufficiency (mild).  6. GERD.  7. History of lumbar surgery.  8. TURP.  9. S/P left hip arthroplasty, 2002.  10.History of tremor.  11.Restless legs syndrome.   SOCIAL HISTORY:  The patient lives with his family in city of [place].  He works full-time as a Hospital doctor, driving short distances throughout the  day.  He has a remote 40-pack-year smoking history, quitting 20 years  ago.  He drinks no alcohol and takes no illicit drugs.  He takes no  herbal medications, nor does he modify his diet in anyway or do any  regular exercise.   FAMILY HISTORY:  Positive for coronary artery disease.  His mother died  at age 86 from myocardial infarction.  Father deceased in his 25s with  lung cancer.  Brother has a history of sinus bradycardia and another  brother deceased at age 79, positive history of coronary artery disease,  although none before the age of 92.   REVIEW OF SYSTEMS:  Positive for the patient having dyspnea on exertion  with one flight of stairs, although he denies orthopnea, PND, or  nocturia or lower extremity edema.  Otherwise, see HPI.  All other  systems reviewed and  were negative.   ALLERGIES:  SULFA.   MEDICATIONS:  1. Primidone 125 mg p.o. t.i.d.  2. Neurontin 300 mg p.o. t.i.d.  3. Lisinopril 20 mg p.o. daily.  4. Ranitidine 300 mg p.o. nightly.  5. Zyrtec 10 mg p.o. daily.  6. Omeprazole 40 mg p.o. daily.  7. Aspirin 81 mg p.o. daily.  8. Multivitamin 1 tablet daily.  9. Calcium 600 mg p.o. daily.  10.Vitamin B 2 grams p.o. daily.  11.Vitamin C 500 mg p.o. daily.   PHYSICAL EXAMINATION:  VITAL SIGNS:  Temperature 97.4 degrees  Fahrenheit, BP 120/70, pulse 50, respiration rate 16, O2 saturation 96%  on room air, and weight 91.5 kg.  Orthostatic blood pressure and pulse  measurements lying 129/72 with pulse 53, sitting 144/75 with pulse 54,  and standing 145/74 with pulse of 58.  GENERAL:  The patient was alert and oriented x3, in no apparent  distress, able to speak and move easily without respiratory distress.  HEENT:  Head is normocephalic and atraumatic.  Pupils equal,  round, and  reactive to light.  Extraocular muscles were intact.  Nares patent  without discharge.  Oropharynx without erythema or exudates.  Dentition  was good.  NECK:  Supple without lymphadenopathy.  No JVD, no thyromegaly, and no  bruits.  LUNGS:  Clear to auscultation bilaterally.  HEART:  Regular rate and rhythm.  Audible S1 and S2.  No clicks, rubs,  murmurs, or gallops.  Pulses 2+ and equal on both upper and lower  extremities bilaterally.  ABDOMEN:  Soft, nontender, and nondistended.  Positive bowel sounds.  No  pulsations and no hepatosplenomegaly.  No rebound or guarding.  EXTREMITIES:  No clubbing, cyanosis, or edema.  MUSCULOSKELETAL:  No joint deformities or effusions.  No spinal or CVA  tenderness.  NEUROLOGIC:  Cranial nerves II through XII are grossly intact.  Strength  is 5/5 in all extremities and axial groups.  Normal sensation throughout  and normal cerebellar function.   RADIOLOGY:  The patient had a chest x-ray on October 06, 2008 that showed  suboptimal inspiration, but no acute cardiopulmonary disease, stable  since December 2006.  The patient had a CT of the head without contrast  that showed no acute intracranial abnormalities.  The patient had EKG  performed on September 26, 2008, and September 27, 2008, both showing sinus  bradycardia in the 50s with no acute ST-T wave changes.  No significant  Q-waves and no significant changes from EKG completed on November 01, 2006.  The patient had echocardiogram performed on November 02, 2006 that showed  LV mildly dilated with an ejection fraction of 50-55%.  Hypokinesis of  septal wall.  LV wall thickness upper limits of normal.  Mild mitral  regurgitation and left atrium mildly dilated.  The patient had a stress  test (Bruce protocol) that was clinically positive, but electrically  equivalent, it was a submaximal exercise treadmill test with appropriate  BP response.  The patient had a cardiac catheterization on February 12, 2007  that showed nonobstructive coronary artery disease, normal LV  function, and mild increase in right heart pressures, specifically  increased during diastole.   LABORATORY VALUES:  The patient had 2 full sets of negative cardiac  enzymes and 1 negative set of point-of-care markers.  Sodium 139,  potassium 3.1, chloride 107, CO2 of 25, BUN 10, creatinine 0.96 and  glucose 146.  WBC 8.2, HGB 15.8, HCT 45.1, and PLT count 219.  The  patient had  urinalysis, which was negative.   ASSESSMENT AND PLAN:  This is a 74 year old male with history noted  above, who presents with 2 episodes of palpitations and dizziness and 1  with brief minimal in severity left-sided chest pain that is  nonradiating, also associated with diaphoresis and some difficulty with  speech.  1. Dizziness/chest pain, ruled out for acute coronary syndrome by      negative cardiac enzymes and no EKG changes.  On telemetry, the      patient shows sinus bradycardia without significant pauses or any      other arrhythmias.  Would schedule for an appointment with Dr.      Ladona Ridgel with possible event recorder for 4 weeks, repeat an      echocardiogram outpatient as well as possible outpatient stress      Myoview.  2. Dyspnea on exertion, known diastolic congestive heart failure, but      with no regular exercise.  Please see plan above for repeat echo      and pending no significant changes in two prior echocardiograms,      would continue to encourage regular exercise and also emphasize the      importance of a heart-healthy diet.      Jarrett Ables, [facility]      Marca Ancona, MD  Electronically Signed    MS/MEDQ  D:  10/07/2008  T:  10/08/2008  Job:  [id number]

## 2010-11-26 NOTE — Assessment & Plan Note (Signed)
[facility]                         ELECTROPHYSIOLOGY OFFICE NOTE   KIMONI, PAGLIARULO                         MRN:          [id number]  DATE:01/11/2007                            DOB:          [birth year]    Mr. Dieu referred today for evaluation for recurrent episodes of  dizziness and lightheadedness and associated chest discomfort.  The  patient is a very pleasant 75 year old man with a history of  hypertension who was admitted to the hospital back in March with a near  syncopal episode and some bradycardia.  He was thought to be orthostatic  at that time.  He also had some hyponatremia.  He underwent additional  evaluation with a 2D echo which demonstrated preserved LV function and  an outpatient perfusion (adenosine) stress-test demonstrated no obvious  ischemia.  At that time his cardiac enzymes were very minimally  increased.  The patient was thought to be dry.  Since then he has done  reasonably well although he does occasionally have twinges of chest  discomfort.  There are not particularly related to exertion or some type  of food intake.  There is no radiation. In addition to this the patient  continues to have some periods of dizziness and lightheadedness and he  denies palpitations.  He denies frank syncope. He has no other specific  complaints.   MEDICATIONS:  Include Omeprazole, gabapentin, primidone, Lisinopril,  Simvastatin, methocarbamol, aspirin, ranitidine and multiple vitamins.   FAMILY HISTORY:  Notable for parents being deceased, his mother at age  9 of an MI and his father at age 34 of lung cancer complications.  He  had one sister deceased at age 64 of lung cancer as well.   SOCIAL HISTORY:  The patient has a history of tobacco use but stopped  smoking about 20 years ago. He denies alcohol abuse.  He still works as  a Health visitor.   PAST SURGICAL HISTORY:  Notable for tonsillectomy, hip surgery, bladder  surgery, and  back surgery in January of 2008.   REVIEW OF SYSTEMS:  In addition to those mentioned in the HPI, he also  has occasional episodes of headache and does note intermittent high  blood pressure. The rest of his systems were reviewed and found to be  negative.   PHYSICAL EXAMINATION:  GENERAL:  He is a pleasant, well-appearing 67-  year-old man in no acute distress.  VITAL SIGNS:  Orthostatic vitals were obtained today.  His supine blood  pressure was 120/70 and heart rate 64; standing, his heart rate was 68  with a blood pressure up to 140 and after 5 minutes of standing his  blood pressure was 130/85 with a heart rate of 72, he did not some  dizziness with this. Respirations were 18.  HEENT EXAM:  Normocephalic, atraumatic.  Pupils equal and round.  Oropharynx was moist. Sclerae anicteric.  NECK:  No jugular venous distension, there is no thyromegaly, trachea  was midline and carotids are 2+ and symmetric.  LUNGS:  Clear bilaterally to auscultation, no wheezes, rales or rhonchi.  No increased work of breathing  was present.  CARDIOVASCULAR EXAM:  Had a regular rate and rhythm and normal S1 and  S2. There are no murmurs, rubs, or gallops. PMI was not enlarged nor was  it laterally displaced.  ABDOMEN:  Soft, nontender, nondistended, there is no organomegaly, the  bowel sounds are present, there was no rebound or guarding.  EXTREMITIES:  Demonstrated no clubbing, cyanosis or edema. The pulses  were 2+ and symmetric.  NEUROLOGIC EXAM:  Alert and oriented x3. Cranial nerves intact. Strength  was 5 out of 5 and symmetric.   EKG demonstrates sinus rhythm with normal axis and intervals.   IMPRESSION:  1. Recurrent episodes of dizziness of unclear etiology.  2. Chest pain with typical and atypical features.  3. Intermittent hypertension.   DISCUSSION:  The etiology for the patient's symptoms is still unclear to  me.  His initial ischemia workup was negative. What I would really like  to  know is what is his functional capacity and I have recommended that  he undergo exercise treadmill testing utilizing a Bruce protocol for  this.  Will make additional recommendations based on the result of  exercise testing. With regard to his dizziness, I think much of his  symptoms are related to orthostasis although we did not document it  today.  Will plan on a period of watchful waiting with this and see him  back as needed.     Doylene Canning. Ladona Ridgel, MD  Electronically Signed    GWT/MedQ  DD: 01/11/2007  DT: 01/12/2007  Job #: [id number]   cc:   Corwin Levins, MD

## 2010-11-26 NOTE — H&P (Signed)
NAMEDAISHAWN, LAUF                  ACCOUNT NO.:  [id number]   MEDICAL RECORD NO.:  [id number]          PATIENT TYPE:  EMS   LOCATION:  MAJO                         FACILITY:  [facility]   PHYSICIAN:  Mick Sell, MD DATE OF BIRTH:  02-14-1936   DATE OF ADMISSION:  10/06/2008  DATE OF DISCHARGE:                              HISTORY & PHYSICAL   PRIMARY CARE PHYSICIAN:  Corwin Levins, M.D.   CARDIOLOGIST:  Doylene Canning. Ladona Ridgel, M.D.   CHIEF COMPLAINT:  Chest pain and dizziness.   HISTORY OF PRESENT ILLNESS:  This is a 75 year old white male who is  quite active and continues to work but has a history of hypertension,  diastolic dysfunction, mild chronic renal insufficiency, dyslipidemia  and GERD who presented to the emergency room after an episode of  shakiness and dizziness while driving around 2:44 p.m.  He says at that  time he felt a little heart fluttery and had a little bit of vertigo and  looking down.  At that time he had no chest pain, but earlier  but  earlier in the day, he had had some sharp left-sided chest pain that  lasted a few minutes and was 4 out of 10 in intensity.  It was  nonexertional.  He pulled over his car and called EMS but by the time  EMS had arrived, his symptoms had abated.  Of note, he has been active,  has been working as a TEFL teacher.  He has had no  fevers, chills, night sweats, weight loss, headache, neck pain, cough,  shortness of breath, abdominal pain, nausea, vomiting, diarrhea, lower  extremity pain or swelling.  He has been eating and drinking well and  has no other concerning symptoms.   PAST MEDICAL HISTORY:  1. As above including lumbar surgery.  2. TURP.  3. Left hip arthroplasty 2002.  4. Tremor.  5. Restless leg.  6. Left heart cath August 2008 with nonobstructive coronary artery      disease and normal left ventricular systolic function.   ALLERGIES:  SULFA.   MEDICATIONS:  Per his handwritten list that he  keeps with him.  1. Primidone 250 mg half tablet three times a day.  2. Gabapentin 300 mg one tablet three times a day.  3. Lisinopril 40 mg half a tablet a day.  4. Zantac 300 mg at bedtime.  5. Zyrtec 10 mg at bedtime.  6. Omeprazole 20 mg 2 tablets daily.  7. Tamlusolin 0.4 mg 1 capsule daily.  This has been held since      cataract surgery 1 week ago.  8. Aspirin 81 mg once a day.  9. Multivitamin.  10.Calcium 600 mg once a day.  11.Vitamin D 2000 mg once a day.  12.Vitamin C 500 mg once a day.   FAMILY HISTORY:  Noncontributory.   SOCIAL HISTORY:  Lives with his family.  Does not smoke, does not drink  and works as a Immunologist for a lab.   PHYSICAL EXAMINATION:  VITAL SIGNS:  In the ED he was afebrile 97.8.  His pulse ranged from 57 to 70, blood pressure was 82/78, respirations  17 to 20 and is oxygen saturation was 100%.  GENERAL:  He is in no acute distress, alert and oriented and  interactive.  HEENT:  Neck supple.  His oropharynx is clear.  Mucous membranes were  moist.  He had mild right conjunctival injection from cataract surgery.  Pupils were equal, round, reactive to light and accommodation.  Extraocular movements were intact.  HEART:  Regular rate and rhythm with no murmur noted.  LUNGS:  Clear to auscultation.  ABDOMEN:  Soft, nontender, nondistended.  No hepatosplenomegaly.  EXTREMITIES:  There is no clubbing, cyanosis or edema.  NEURO:  He is alert and oriented x3 with cranial nerves II-XII grossly  intact.  Strength and sensation were intact  bilateral upper extremity  lower extremity.   LABORATORY DATA:  The patient had blood work with a sodium 133,  potassium 3.8, chloride 102, bicarb 24, BUN 9, creatinine 0.97, glucose  110.  Cardiac enzymes drawn at 6 p.m. were negative with a troponin less  than 0.05.  Urinalysis was negative.  Chest x-ray was negative.  CT of  the head was negative except for some old small vessel ischemic disease.  He had  a white blood count of 8.2, hemoglobin 15.8, platelets 219,000.  EKG showed normal sinus rhythm, normal intervals and axis except for  slight decrease in ST depression in II, III and aVF.   ASSESSMENT:  This is a pleasant 75 year old, quite active male who works  5 days a week who came to the emergency room with an episode of  dizziness and shakiness while driving.  He has noted no true syncope.  No loss of consciousness.  No seizure activity.  He does report perhaps  heart fluttering but had no chest pain or shortness of breath at that  time.  Differential is most likely an arrhythmia given the sensation of  heart fluttering.  He does not  seem to be at risk for an acute coronary  event since he had a catheterization about a year and a half ago which  showed quite minimal disease.  There is no evidence to suggest a  pulmonary embolus or deep venous thrombosis.  He has no focal  neurological findings that suggest transient ischemic attack or  cerebrovascular accident.  His head CT was negative.   PLAN:  1. Will admit to telemetry monitoring overnight to evaluate for      arrhythmia.  2. Repeat cardiac enzymes to rule out acute coronary event.  3. For his hypertension, will place him back on lisinopril.  Blood      pressure was elevated overnight but I am hesitant to make a change      in his blood pressure medicines at this time.  4. Gastroesophageal reflux disease.  Will continue him on Zantac and      omeprazole.  5. For his restless leg syndrome, will continue him on Neurontin and      primidone.  Also continue him on aspirin for his baseline      medications.   This patient may very well be able to be discharged tomorrow after 24  hours for observation.  If he has no  arrhythmias noted and his symptoms  do not recur, he could follow up with his cardiologist, Dr. Sharrell Ku.      Mick Sell, MD  Electronically Signed     DPF/MEDQ  D:  10/06/2008  T:  10/07/2008   Job:  [id number]

## 2010-11-26 NOTE — Assessment & Plan Note (Signed)
[facility]                         ELECTROPHYSIOLOGY OFFICE NOTE   SALIL, RAINERI                         MRN:          [id number]  DATE:03/12/2007                            DOB:          09-Oct-1935    Mr. Gavigan returns today for follow-up.  He is a very pleasant middle-aged  male with a history of congestive heart failure and chest pain, who  underwent recent catheterization demonstrating nonobstructive coronary  disease, preserved LV systolic function and mild elevation of his right-  sided pressures, which was thought all secondary to diastolic cardiac  dysfunction.  The patient since discharge from the hospital has been  well.  He denies chest pain or shortness of breath.  He has had no  specific complaints today.  Overall the patient also notes dizziness  which is improved.  He says he does have some dyspnea with exertion.  Basically these symptoms are unchanged.   PHYSICAL EXAMINATION:  He is a pleasant 75 year old man in no acute  distress.  Blood pressure today was 122/76, the pulse was 70 and regular,  respirations were 16, and the weight was 212 pounds.  NECK:  No jugular venous distention.  LUNGS:  Clear bilaterally to auscultation.  CARDIAC:  Regular rate and rhythm with a normal S1 and S2.  I did not  appreciate any murmurs, rubs or gallops.  EXTREMITIES:  No cyanosis, clubbing or edema.  Pulses were 2+ and  symmetric.   MEDICATIONS:  1. Omeprazole 40 mg a day.  2. Gabapentin 300 mg three times daily.  3. Primidone.  4. Lisinopril 40 mg half a tablet daily.  5. Simvastatin 40 mg a day.  6. Aspirin 81 mg daily.  7. Ranitidine 300 mg a day.  8. Multivitamin.   His EKG today demonstrates sinus rhythm with nonspecific ST-T wave  abnormality.   IMPRESSION:  1. Diastolic heart failure.  2. Shortness of breath secondary to #1.  3. Hypertension.  4. Dyslipidemia.   DISCUSSION:  Mr. Janssen remains stable following his  catheterization.  I  have discussed treatment options with him in detail and I have  recommended that he start a program of walking on a regular basis.  The  patient stated that he walks sometimes during the day but in short  bursts and not in a regular manner.  I have instructed him on the  importance of exercise and  specifically in trying to walk 30 or 40 minutes or more a day.  I have  also asked him to try to lose  some weight.  He is instructed to maintain a very strict sodium-free  diet.  We will plan to see him back in several months.     Doylene Canning. Ladona Ridgel, MD  Electronically Signed    GWT/MedQ  DD: 03/12/2007  DT: 03/14/2007  Job #: [id number]

## 2010-11-26 NOTE — Assessment & Plan Note (Signed)
[facility]                         ELECTROPHYSIOLOGY OFFICE NOTE   NIKOLIS, BERENT                         MRN:          [id number]  DATE:02/08/2007                            DOB:          Aug 26, 1935    Mr. Sardo returns today for followup to discuss the results of his stress  test and recommend additional evaluation and treatment.  The patient is  a very pleasant 75 year old man with a history of hypertension,  dyslipidemia and dizziness and lightheadedness who was initially  referred to me for evaluation.  He, when I initially saw him, had  evidence of chest discomfort with both typical and atypical features and  had undergone perfusion stress testing (chemical) which was electrically  negative with normal perfusion.  Because of persistent symptoms and  because of inability initial at __________ walking I had recommended a  regular treadmill test for the patient to see what his functional  capacity was.  He underwent Bruce protocol stress test back on July  25th.  At that time his initial blood pressure was 126/78 with a pulse  of 72.  He walked for 7 minutes on a Bruce protocol, achieving a peak  heart rate of 110 bpm.  His blood pressure peaked at 190/74.  His test  was stropped secondary to chest discomfort which resolved with rest.  It  radiated to the left arm and shoulder and it was associated with  shortness of breath.  Of note that this exercise repeated his symptoms  which he had had previously, also typically with exertion.  The  electrocardiogram during stress testing demonstrated no diagnostic ST  segment change, there was between 1-2 mm of upsloping ST elevation which  did persist into recovery but was electrically nondiagnostic.  He  returns today for evaluation.  In general, he thinks that his symptoms  are worse.  There is no neck or jaw pain but, as noted previously, he is  short of breath with his chest discomfort.  It generally  resolves with  rest.   PHYSICAL EXAM TODAY:  He is a pleasant 75 year old man in no acute  distress.  The blood pressure was 138/78, the pulse was 60 and regular,  the respirations were 18, the weight was 208 pounds.  NECK:  No jugular venous distention.  LUNGS:  Clear bilaterally to auscultation.  No wheezes, rales or rhonchi  were present.  CARDIOVASCULAR EXAM:  A regular rate and rhythm with normal S1 and S2.  EXTREMITIES:  Demonstrated no edema.  SKIN EXAM:  Normal.   IMPRESSION:  Recurrent chest pain, increasingly frequent and severe,  typically related to exertion but though not exclusively, and associated  with shortness of breath.   DISCUSSION:  Despite the patient's nondiagnostic stress test, I am still  concerned that he has occult coronary disease as the etiology of his  symptoms.  For this reason, I recommended that he proceed with left  heart catheterization and, if need be, angioplasty or revascularization.  He  will continue on his medical therapy and will plan to see him back based  on  the results of his catheterization.     Doylene Canning. Ladona Ridgel, MD  Electronically Signed    GWT/MedQ  DD: 02/08/2007  DT: 02/09/2007  Job #: [id number]   cc:   Corwin Levins, MD

## 2010-11-28 ENCOUNTER — Encounter: Payer: Self-pay | Admitting: Internal Medicine

## 2010-11-28 ENCOUNTER — Ambulatory Visit (INDEPENDENT_AMBULATORY_CARE_PROVIDER_SITE_OTHER): Payer: Medicare Other | Admitting: Internal Medicine

## 2010-11-28 VITALS — BP 120/68 | HR 60 | Temp 98.6°F | Ht 67.0 in | Wt 188.0 lb

## 2010-11-28 DIAGNOSIS — I10 Essential (primary) hypertension: Secondary | ICD-10-CM

## 2010-11-28 DIAGNOSIS — Z0000 Encounter for general adult medical examination without abnormal findings: Secondary | ICD-10-CM | POA: Insufficient documentation

## 2010-11-28 DIAGNOSIS — J309 Allergic rhinitis, unspecified: Secondary | ICD-10-CM

## 2010-11-28 DIAGNOSIS — Z23 Encounter for immunization: Secondary | ICD-10-CM

## 2010-11-28 DIAGNOSIS — E785 Hyperlipidemia, unspecified: Secondary | ICD-10-CM

## 2010-11-28 DIAGNOSIS — N259 Disorder resulting from impaired renal tubular function, unspecified: Secondary | ICD-10-CM

## 2010-11-28 MED ORDER — TETANUS-DIPHTH-ACELL PERTUSSIS 5-2.5-18.5 LF-MCG/0.5 IM SUSP
0.5000 mL | Freq: Once | INTRAMUSCULAR | Status: AC
  Administered 2010-11-28: 0.5 mL via INTRAMUSCULAR

## 2010-11-28 MED ORDER — OMEPRAZOLE 20 MG PO CPDR: 40.0000 mg | DELAYED_RELEASE_CAPSULE | Freq: Every day | ORAL | Status: DC

## 2010-11-28 MED ORDER — SIMVASTATIN 40 MG PO TABS: 60.0000 mg | ORAL_TABLET | Freq: Every day | ORAL | Status: DC

## 2010-11-28 MED ORDER — GABAPENTIN 300 MG PO CAPS: ORAL_CAPSULE | ORAL | Status: DC

## 2010-11-28 MED ORDER — LISINOPRIL 40 MG PO TABS: 20.0000 mg | ORAL_TABLET | Freq: Every day | ORAL | Status: DC

## 2010-11-28 MED ORDER — FLUTICASONE PROPIONATE 50 MCG/ACT NA SUSP: 2.0000 | Freq: Every day | NASAL | Status: DC

## 2010-11-28 NOTE — Assessment & Plan Note (Signed)
Has been labile per pt, but with some degree of overcontrolled recently (though also with some mild dehydration per pt) I would hesitate to increase his med;  Has been taking half of the ace to date;  Will cont at this dose but OK to take in the evening such that BP is better controlled in the AM which has been his pattern

## 2010-11-28 NOTE — Assessment & Plan Note (Signed)
stable overall by hx and exam, and pt to continue medical treatment as before  To re-start the flonase

## 2010-11-28 NOTE — Progress Notes (Signed)
Subjective:    Patient ID: [name], male    DOB: 22-Jun-1936, 75 y.o.   MRN: [id number]  HPI Here to f/u; overall doing ok,  Pt denies chest pain, increased sob or doe, wheezing, orthopnea, PND, increased LE swelling, palpitations, dizziness or syncope.  Pt denies new neurological symptoms such as new headache, or facial or extremity weakness or numbness   Pt denies polydipsia, polyuria.  Pt states overall good compliance with meds, trying to follow lower cholesterol, diabetic diet, wt overall stable but little exercise however.  He is quite concerned - BP has been somewhat labile, higher in the AM. Does have several wks ongoing nasal allergy symptoms with clear congestion, itch and sneeze, without fever, pain, ST, cough or wheezing - ran out of flonase.   Pt denies fever, wt loss, night sweats, loss of appetite, or other constitutional symptoms  Overall good compliance with treatment, and good medicine tolerability.  Past Medical History  Diagnosis Date  . Other chronic nonalcoholic liver disease   . Diverticulosis of colon (without mention of hemorrhage)   . Gastritis   . Esophagitis   . Abdominal tenderness, epigastric   . Bradycardia   . Chest pain   . Dizziness   . Hepatotoxicity     drug induced  . Bladder cancer   . Cough   . Colonic polyp   . Allergic rhinitis   . Renal insufficiency   . CAD (coronary artery disease)   . CHF (congestive heart failure)   . Lumbar disc disease   . PUD (peptic ulcer disease)   . Hiatal hernia     with reflux  . Rheumatoid arthritis   . HTN (hypertension)   . HLD (hyperlipidemia)   . GERD (gastroesophageal reflux disease)   . Depression    Past Surgical History  Procedure Date  . Adenoidectomy   . Coronary angioplasty     percutaneious transluminal  . Tonsillectomy   . Hip arthroplasty 2002    left  . Bladder surgery   . Lumbar disc surgery 1/08  . Transurethral resection of bladder tumor     reports that he quit smoking about 25  years ago. His smoking use included Cigarettes. He has a 22.5 pack-year smoking history. He has never used smokeless tobacco. He reports that he does not drink alcohol or use illicit drugs. family history includes Cancer in his brother, father, and sister; Diabetes in his sister; and Heart attack in his mother. Allergies  Allergen Reactions  . Hydrochlorothiazide     REACTION: hyponatremia  . Sulfonamide Derivatives     REACTION: rash   Current Outpatient Prescriptions on File Prior to Visit  Medication Sig Dispense Refill  . acetaminophen-codeine (TYLENOL #3) 300-30 MG per tablet Take 1 tablet by mouth 2 (two) times daily as needed.        Marland Kitchen amLODipine (NORVASC) 5 MG tablet Take 5 mg by mouth daily.        . Ascorbic Acid (VITAMIN C) 500 MG tablet Take 500 mg by mouth daily.        Marland Kitchen aspirin 81 MG tablet Take 81 mg by mouth daily.        . calcium carbonate (OS-CAL) 600 MG TABS Take 600 mg by mouth daily.        . cetirizine (ZYRTEC) 10 MG tablet Take 10 mg by mouth daily.        . Cholecalciferol (VITAMIN D3) 1000 UNITS CAPS Take 1 capsule by mouth daily.        Marland Kitchen  citalopram (CELEXA) 10 MG tablet Take 10 mg by mouth daily.        . clobetasol (TEMOVATE) 0.05 % ointment Apply topically daily.        . hydrocortisone (ANUSOL-HC) 25 MG suppository Place 25 mg rectally 2 (two) times daily.        . hyoscyamine (LEVSIN, ANASPAZ) 0.125 MG tablet Take 0.125 mg by mouth every 4 (four) hours as needed.        . lidocaine-hydrocortisone (ANAMANTLE HC) 3-0.5 % CREA Place 1 Applicatorful rectally as directed.        . meclizine (ANTIVERT) 12.5 MG tablet Take 12.5 mg by mouth 3 (three) times daily as needed.        . Multiple Vitamin (MULTIVITAMIN) tablet Take 1 tablet by mouth daily.        . nitroGLYCERIN (NITROSTAT) 0.4 MG SL tablet Place 0.4 mg under the tongue every 5 (five) minutes as needed.        . primidone (MYSOLINE) 250 MG tablet Take 125 mg by mouth 3 (three) times daily.        .  ranitidine (ZANTAC) 300 MG capsule Take 300 mg by mouth every evening.        . Tamsulosin HCl (FLOMAX) 0.4 MG CAPS Take 0.4 mg by mouth daily.        . travoprost, benzalkonium, (TRAVATAN) 0.004 % ophthalmic solution Place 1 drop into both eyes at bedtime.        Marland Kitchen DISCONTD: fluticasone (FLONASE) 50 MCG/ACT nasal spray 2 sprays by Nasal route daily.  3 g  3  . DISCONTD: gabapentin (NEURONTIN) 300 MG capsule Take 1 by mouth three times daily and 2 tabs at bedtime       . DISCONTD: lisinopril (PRINIVIL,ZESTRIL) 40 MG tablet Take 40 mg by mouth daily.        Marland Kitchen DISCONTD: omeprazole (PRILOSEC) 20 MG capsule Take 40 mg by mouth daily.        Marland Kitchen DISCONTD: simvastatin (ZOCOR) 40 MG tablet Take 60 mg by mouth at bedtime.         No current facility-administered medications on file prior to visit.   Review of Systems All otherwise neg per pt     Objective:   Physical Exam BP 120/68  Pulse 60  Temp(Src) 98.6 F (37 C) (Oral)  Ht 5\' 7"  (1.702 m)  Wt 188 lb (85.276 kg)  BMI 29.44 kg/m2  SpO2 95% Physical Exam  VS noted Constitutional: Pt appears well-developed and well-nourished.  HENT: Head: Normocephalic.  Right Ear: External ear normal.  Left Ear: External ear normal.  Bilat tm's mild erythema.  Sinus nontender.  Pharynx mild erythema Eyes: Conjunctivae and EOM are normal. Pupils are equal, round, and reactive to light.  Neck: Normal range of motion. Neck supple.  Cardiovascular: Normal rate and regular rhythm.   Pulmonary/Chest: Effort normal and breath sounds normal.  Abd:  Soft, NT, non-distended, + BS Neurological: Pt is alert. No cranial nerve deficit.  Skin: Skin is warm. No erythema.  Psychiatric: Pt behavior is normal. Thought content normal. 1+ nervous No active synovitis       Assessment & Plan:

## 2010-11-28 NOTE — Patient Instructions (Signed)
You had the tetanus shot today You can take the lisinopril BP medicine in the evening Continue all other medications as before You are given the refills you needed today Please return in 6 months, or sooner if needed

## 2010-11-28 NOTE — Assessment & Plan Note (Signed)
stable overall by hx and exam, most recent lab reviewed with pt, and pt to continue medical treatment as before  Lab Results  Component Value Date   LDLCALC 72 09/12/2010

## 2010-11-28 NOTE — Assessment & Plan Note (Signed)
stable overall by hx and exam, most recent lab reviewed with pt, and pt to continue medical treatment as before Lab Results  Component Value Date   CREATININE 1.0 09/26/2010

## 2010-11-29 NOTE — Discharge Summary (Signed)
[name], [name]                  ACCOUNT NO.:  [id number]   MEDICAL RECORD NO.:  [id number]          PATIENT TYPE:  EMS   LOCATION:  ED                           FACILITY:  [facility]   PHYSICIAN:  Valerie A. Felicity Coyer, MDDATE OF BIRTH:  14-May-1936   DATE OF ADMISSION:  10/31/2006  DATE OF DISCHARGE:  11/03/2006                               DISCHARGE SUMMARY   DISCHARGE DIAGNOSES:  1. Status post near syncopal episode with bradycardia and hypotension.  2. Orthostatic hypotension.  3. Hyponatremia.  4. Acute renal insufficiency.  5. Hypokalemia.  6. Dyslipidemia.  7. Mild elevation of CK.   HISTORY OF PRESENT ILLNESS:  [name] is a 75 year old white male who  was admitted on October 31, 2006 after an episode of weakness with  dizziness accompanied by near syncope.  He was evaluated at the local  fire department and was noted to be hypotensive and bradycardiac at that  time.  He was treated with IV atropine.  The patient was admitted for  further evaluation and treatment.   PAST MEDICAL HISTORY:  1. Status post lumbar surgery for spinal stenosis.  2. Transurethral resection of bladder cancer, Dr. Aldean Ast.  3. Left hip arthroplasty, 2002.  4. Hypertension.  5. Familial tremor.  6. Restless legs syndrome.  7. Dyslipidemia.   COURSE OF HOSPITALIZATION:  Problem:  1. Status post near syncopal episode with bradycardia and hypotension.      The patient was admitted and his diltiazem was held.  In addition,      his hydrochlorothiazide was held as he was noted to be hypokalemia      as well as hyponatremic.  He has been maintained off of his blood      pressure medicines, including lisinopril, and his blood pressure      has remained stable.  Blood pressure at time of discharge is      129/73.  He was noted to be orthostatic during this admission with      a systolic pressure of 120, dropping down to 103 with standing, and      pulse rising from 58 laying to 74 standing.  The  patient is      currently ambulating without difficulty.  He has had no further      episodes.  He has been maintained on telemetry with heart rate in      the high 50s to low 60s and asymptomatic.  A 2-D echo was performed      which revealed a ventricular ejection fraction of 50 to 55% as well      as hypokinesis of the septal wall.  Patient reports a negative      stress test a long time ago, unable to quantify when.  We will set      up an outpatient stress test to be done after discharge prior to      followup with Dr. Oliver Barre.  2. Acute renal insufficiency.  The patient was noted to be mildly      dehydrated on admission with a creatinine of 1.76 on  admission.      Creatinine after hydration dropped to 0.93 which is the patient's      base.  3. Elevated CK.  The patient was noted to have negative cardiac      enzymes on admission; however, he was noted to have a mild      elevation in his CK.  A follow-up CK performed on April 21 was 385.      His Lipitor has been held and will continue to be held at the time      of discharge as well as other medications until followup with      patient's primary care.   MEDICATIONS AT TIME OF DISCHARGE:  1. Aspirin 325 mg p.o. daily.  2. Prilosec 20 mg p.o. b.i.d.  3. Primidone 125 mg p.o. t.i.d.  4. Neurontin 400 mg p.o. q.h.s.  5. Travatan eye drops 0.004% one drop to each eye q.h.s.  6. Darvocet N-100 one tab p.o. q.6h. p.r.n.  7. Ranitidine 30 mg p.o. daily.  Patient instructed to continue dose      as prior to admission.   Patient instructed to discontinue Zocor, lisinopril, and  hydrochlorothiazide.   LABORATORIES AT TIME OF DISCHARGE:  Hemoglobin 15.4, hematocrit 44.9,  BUN 11, creatinine 0.93.   FOLLOWUP:  The patient is instructed to follow up for an adenosine  Myoview on November 06, 2006 at 7:15.  In addition, he is to follow up with  Dr. Oliver Barre on Tuesday, April 29 at 2:15 p.m.  He is instructed to  discontinue Zocor,  lisinopril, and hydrochlorothiazide until further  instruction per Dr. Jonny Ruiz.  He is also instructed to return to the ER if  he develops chest pain or recurrent fainting or weakness.      Sandford Craze, NP      Raenette Rover. Felicity Coyer, MD  Electronically Signed    MO/MEDQ  D:  11/03/2006  T:  11/03/2006  Job:  [id number]   cc:   Corwin Levins, MD

## 2010-11-29 NOTE — Op Note (Signed)
   NAME:  [name], [name]                            ACCOUNT NO.:  [id number]   MEDICAL RECORD NO.:  [id number]                   PATIENT TYPE:  AMB   LOCATION:  [facility]                                 FACILITY:  [facility]   PHYSICIAN:  Rozanna Boer., M.D.      DATE OF BIRTH:  06-Sep-1935   DATE OF PROCEDURE:  09/21/2002  DATE OF DISCHARGE:                                 OPERATIVE REPORT   PREOPERATIVE DIAGNOSIS:  Hematuria with large bladder tumor, left lateral  wall.   POSTOPERATIVE DIAGNOSIS:  Hematuria with large bladder tumor, left lateral  wall.   OPERATION:  TURBT.  Left lateral wall (greater than 5 cm)   ANESTHESIA:  General.   SURGEON:  Courtney Paris, M.D.   BRIEF HISTORY:  This 75 year old patient has had hematuria for several  weeks, was found to have a medium to large size bladder tumor in the left  posterior wall and is admitted now for TURBT.  No previous history of  bladder tumors, been a nonsmoker for 15 years.  He has been disabled from  rheumatoid arthritis since 11/99.  He takes Tiazac, Viagra, Mysoline,  Neurontin, Norvasc, Maxzide, Zocor, and Protonix.   DESCRIPTION OF PROCEDURE:  The patient was placed on the operating table in  dorsal lithotomy position.  After satisfactory induction of general  anesthesia, was prepped and draped with Betadine in the usual sterile  fashion.  The meatus was somewhat tight, and I had to dilate him with sounds  to accept the 24 Olympus continuous-flow resectoscope.  Pictures were made  on the tumor on the left lateral wall just near but extending laterally from  the left orifice.  After pictures were made, the tumor was resected.  It  looks liked it had a fairly broad stalk.  An area of about 5 cm was resected  on the left lateral wall.  After the tumor was gone, several shavings were  then done for muscle involvement.  The patient was then fulguration, and  hemostasis appeared to be adequate.  The scope was removed,  and a #18 Foley  catheter inserted.  Irrigant was clear.  This was left to straight drainage.  A B&O suppository was inserted.  The patient was then taken to the recovery  room in good condition to be later discharged as an outpatient with detailed  written instructions.                                               Rozanna Boer., M.D.    HMK/MEDQ  D:  09/21/2002  T:  09/21/2002  Job:  [id number]

## 2010-11-29 NOTE — Op Note (Signed)
[facility]  Patient:    [name], [name]                           MRN: [id number] Proc. Date: 06/29/00 Adm. Date:  [id number] Disc. Date: [id number] Attending:  Ollen Gross V                           Operative Report  PREOPERATIVE DIAGNOSIS:  Intractable left hip pain with labral tear.  POSTOPERATIVE DIAGNOSES:  Intractable left hip pain with labral tear. Chondral defect acetabulum.  PROCEDURE:  Left hip arthroscopy with labral debridement and chondroplasty.  SURGEON:  Ollen Gross, M.D.  ASSISTANT:  Alexzandrew L. Perkins, P.A.-C.  ANESTHESIA:  General.  ESTIMATED BLOOD LOSS:  Minimal.  DRAINS:  None.  COMPLICATIONS:  None.  CONDITION:  Stable to recovery.  BRIEF CLINICAL NOTE:  Mr. [name] is a 75 year old ______ who sustained an injury in a fall to his left hip in July 2001.  He has had significant pain and mechanical symptoms, which have been refractory to nonoperative management. Intraarticular injection provided some relief and now presents for hip scope and debridement.  DESCRIPTION OF PROCEDURE:  After successful administration of general anesthetic, the patient was placed in the right lateral decubitus position with left side up and his left foot was placed into the traction boot and peroneal post placed, which was well-padded on his inner thigh.  Traction was then placed under fluoroscopic guidance to distract the hip joint approximately 7-10 mm.  The hip was then prepped and draped in usual sterile fashion.   The spinal needles were then passed into the joint into the anterior and posterior peritrochanter positions so as to enter the joint confirmed by fluoroscopy and confirmed by injecting saline through the posterior needle, which lead to egress through the anterior needle.  A small incision was made around the posterior needle, then the Nitinol wire was passed through the needle and the needle removed and the dilators were  placed. First, were 5 mm and 7 mm.  The 7 mm cannula was passed into the joint and then the camera was placed and arthroscopic visualization proceeded. Immediately upon inspecting, the femoral head looked normal.  The fovea showed some hypertrophic tissue, but no abnormalities or loose bodies.  The posterior aspect of the acetabulum looked normal.  The anterior aspect showed a large anterior labral tear, which was flap-like in nature, but no detachment.  The needle was isolated anteriorly.  Then, the guidewire was passed and a small incision made and the dilator was passed over the wire so as to get a 5 mm cannula anteriorly.  This was then switched for a seven.  The shaver was passed and the labrum was debrided back to a stable base using a combination of the shaver and the ArthroCare ablator.  It was then probed and found to be stable.  Chondral defect was debrided back to stable edges with the shaver and the ArthroCare.  The remainder of the joint was again inspected and looked normal.  The arthroscopic equipment was removed and then 20 cc of 0.5% Marcaine with epinephrine were injected through the inflow cannula into the joint.  The cannulae were then taken out and traction released on the joint and incision closed with interrupted 4-0 nylon.   Incision cleaned and dried and a bulky sterile dressing applied.  Patient was then awakened and transported to  recovery in stable condition. DD:  08/31/00 TD:  08/31/00 Job: [id number]

## 2010-11-29 NOTE — Op Note (Signed)
NAME:  [name], [name]                  ACCOUNT NO.:  [id number]   MEDICAL RECORD NO.:  [id number]          PATIENT TYPE:  AMB   LOCATION:  DAY                          FACILITY:  [facility]   PHYSICIAN:  Jene Every, M.D.    DATE OF BIRTH:  23-Sep-1935   DATE OF PROCEDURE:  07/16/2005  DATE OF DISCHARGE:                                 OPERATIVE REPORT   PREOPERATIVE DIAGNOSIS:  1.  Spinal stenosis and herniated nucleus pulposus at the L5-S1 level.   POSTOPERATIVE DIAGNOSIS:  1.  Spinal stenosis and herniated nucleus pulposus at the L5-S1 level.   PROCEDURE PERFORMED:  1.  Lateral recess decompression and foraminotomy of L5.  2.  Microdiscectomy at L5-S1.   ANESTHESIA:  General.   ASSISTANT:  Roma Schanz, P.A.   BRIEF HISTORY AND INDICATIONS:  This is a 75 year old with L5-S1  radiculopathy secondary to spinal stenosis, disc degeneration, increased  lumbosacral angle, foraminal disc herniation refractory to conservative  treatment.  Operative intervention is indicated for decompression of the L5-  S1 nerve root, bilateral recess decompression, and microdiscectomy.  The  risks and benefits were discussed including bleeding, infection, damage to  neurovascular structures, CSF leakage, epidural fibrosis, segment disease,  need for fusion in the future, anesthetic complications, etc.   TECHNIQUE:  With the patient in supine position and after the induction of  general anesthesia and 1 g of Kefzol, he was placed on the [place] frame.  All bony prominences were well padded.  The lumbar region was prepped and  draped in the usual sterile fashion.  A 18-gauge spinal needle was utilized  to localize the L5-S1 interspace, confirmed with x-ray.  The incision was  made from the spinous process of L5 to S1.  The subcutaneous tissue was  dissected with electrocautery used to achieve hemostasis.  The dorsal lumbar  fascia was identified and divided in line with the skin incision.  The  paraspinous muscle was elevated from L5 and S1.  The McCullough retractor  was placed.  The operating microscope was draped and brought into the  surgical field.   Confirmatory radiograph was obtained with the [facility] in the interspace.  There was a fairly acute lumbosacral angle.  The ligamentum flavum was  detached from the cephalad edge of S1 utilizing straight curette and a 2 mm  Kerrison.  Hemilaminotomy of the caudad edge of L5 was performed.  Ligamentum flavum was removed from the interspace.  There was significant  ligamentum flavum hypertrophy and facet hypertrophy laterally into the  lateral recess.  The neural elements were protected and we decompressed the  lateral recess with 2 mm Kerrison.  Foraminotomy at L5 was performed.  Epidural fat was noted.  On reinspection, this was cauterized.  The neural  elements were well protected with the use of the operating microscope.  Focal HNP was noted in the foraminal region extending out into the foramen.  We performed the annulotomy.  A copious portion of disc material was removed  from the disc space from the left side of the patient, medially with  multiple passes,  and from the right side of the patient out into the  foramen.  Again, neural elements were protected at all times.  After the  decompression, a hockey stick probe was placed and passed freely out the  foraminal L5 and S1.  There was good excursion of the S1 nerve root.  This  was placed through the center medial pedicle without difficulty.  We  copiously irrigated the disc space with antibiotic irrigation.  Inspection  revealed no evidence of CSF leak or active bleeding.  The McCullough  retractor was removed.  Paraspinal muscles were inspected with no active  bleeding.  Dorsal lumbar fascia was reapproximated with #1 Vicryl in  interrupted figure-of-8 sutures.  The subcutaneous tissue was reapproximated  with 2-0 Vicryl simple sutures.  The skin was reapproximated with  4-0  subcuticular Prolene.  The wound was reinforced with Steri-Strips.  Sterile  dressing is applied.  He is placed supine on the hospital bed, extubated  without difficulty, and transported to the recovery room in stable  condition.   The patient tolerated the procedure well with no complications.  There were  10 mL of blood loss.      Jene Every, M.D.  Electronically Signed     JB/MEDQ  D:  07/16/2005  T:  07/16/2005  Job:  [id number]

## 2010-12-11 ENCOUNTER — Other Ambulatory Visit: Payer: Self-pay | Admitting: *Deleted

## 2010-12-11 NOTE — Telephone Encounter (Signed)
PA requested for Simvastatin 40mg  tablet PA Form requested from [facility] @ [phone] [12/05/10] Form recvd, completed & faxed for Approval [12/06/10] Approval [for 135/90] received and faxed to pharmacy @ [phone] [12/10/10]  Pt Informed.

## 2011-04-03 ENCOUNTER — Ambulatory Visit: Payer: Medicare Other | Admitting: Internal Medicine

## 2011-04-16 ENCOUNTER — Other Ambulatory Visit: Payer: Self-pay | Admitting: Internal Medicine

## 2011-04-16 ENCOUNTER — Telehealth: Payer: Self-pay

## 2011-04-16 MED ORDER — GABAPENTIN 300 MG PO CAPS: ORAL_CAPSULE | ORAL | Status: DC

## 2011-04-16 NOTE — Telephone Encounter (Signed)
Called the patient informed of MD's instructions. 

## 2011-04-16 NOTE — Telephone Encounter (Signed)
Refill done hardcopy to [facility]  OK to hold on any medication change for BP until Friday, unless having any CP, sob or other unusual symtpoms

## 2011-04-16 NOTE — Telephone Encounter (Signed)
Called the patient left message to call back and faxed hardcopy to pharmacy.

## 2011-04-16 NOTE — Telephone Encounter (Signed)
The patient called to request a refill on Gabapentin sent to [facility]. Also informed his BP has been elevated recently (179/85, this am was back down to 135/73). He increased the 1/2 pill to 1 in the morning and evening, but does not seem to help. He is scheduled to see Dr. Ladona Ridgel on Friday. Please advise on refill and BP.

## 2011-04-18 ENCOUNTER — Encounter: Payer: Self-pay | Admitting: Internal Medicine

## 2011-04-18 ENCOUNTER — Ambulatory Visit (INDEPENDENT_AMBULATORY_CARE_PROVIDER_SITE_OTHER): Payer: Medicare Other | Admitting: Internal Medicine

## 2011-04-18 VITALS — BP 137/75 | HR 65 | Ht 67.0 in | Wt 201.0 lb

## 2011-04-18 DIAGNOSIS — I10 Essential (primary) hypertension: Secondary | ICD-10-CM

## 2011-04-18 DIAGNOSIS — E785 Hyperlipidemia, unspecified: Secondary | ICD-10-CM

## 2011-04-18 NOTE — Assessment & Plan Note (Signed)
He will continue his current medical therapy. He is instructed to maintain a low-fat diet.

## 2011-04-18 NOTE — Assessment & Plan Note (Signed)
Blood pressure is well-controlled today. He notes that he had one episode of elevated blood pressure at home. I have told him that if his blood pressure goes above 160 or 170 systolic, he is instructed to take an additional diuretic. Otherwise he'll continue his medical therapy as previously described.

## 2011-04-18 NOTE — Progress Notes (Signed)
HPI [name] returns today for followup. He has a history of hypertension as well as orthostasis. When I saw him last, we had him discontinue his diuretic taking that this was making his orthostatic symptoms worse. He has for the most part improved. He notes one episode of dizziness and lightheadedness. He denies chest pain, shortness of breath, or significant palpitations. Allergies  Allergen Reactions  . Hydrochlorothiazide     REACTION: hyponatremia  . Sulfonamide Derivatives     REACTION: rash     Current Outpatient Prescriptions  Medication Sig Dispense Refill  . acetaminophen-codeine (TYLENOL #3) 300-30 MG per tablet Take 1 tablet by mouth 2 (two) times daily as needed.        Marland Kitchen amLODipine (NORVASC) 5 MG tablet Take 5 mg by mouth daily.        . Ascorbic Acid (VITAMIN C) 500 MG tablet Take 500 mg by mouth daily.        Marland Kitchen aspirin 81 MG tablet Take 81 mg by mouth daily.        . calcium carbonate (OS-CAL) 600 MG TABS Take 600 mg by mouth daily.        . cetirizine (ZYRTEC) 10 MG tablet Take 10 mg by mouth daily.        . Cholecalciferol (VITAMIN D3) 1000 UNITS CAPS Take 1 capsule by mouth daily.        . citalopram (CELEXA) 10 MG tablet Take 10 mg by mouth daily.        . clobetasol (TEMOVATE) 0.05 % ointment Apply topically daily.        . fluticasone (FLONASE) 50 MCG/ACT nasal spray 2 sprays by Nasal route daily.  3 Act  3  . gabapentin (NEURONTIN) 300 MG capsule 1 tab by mouth three times daily, and 2 tabs by mouth at bedtime  450 capsule  1  . hydrocortisone (ANUSOL-HC) 25 MG suppository Place 25 mg rectally as needed.       . hyoscyamine (LEVSIN, ANASPAZ) 0.125 MG tablet Take 0.125 mg by mouth every 4 (four) hours as needed.        . lidocaine-hydrocortisone (ANAMANTLE HC) 3-0.5 % CREA Place 1 Applicatorful rectally as directed.        Marland Kitchen lisinopril (PRINIVIL,ZESTRIL) 40 MG tablet Take 0.5 tablets (20 mg total) by mouth at bedtime.  45 tablet  3  . Multiple Vitamin (MULTIVITAMIN)  tablet Take 1 tablet by mouth daily.        . nitroGLYCERIN (NITROSTAT) 0.4 MG SL tablet Place 0.4 mg under the tongue every 5 (five) minutes as needed.        Marland Kitchen omeprazole (PRILOSEC) 20 MG capsule Take 2 capsules (40 mg total) by mouth daily.  180 capsule  3  . ranitidine (ZANTAC) 300 MG capsule Take 300 mg by mouth every evening.        . simvastatin (ZOCOR) 40 MG tablet Take 1.5 tablets (60 mg total) by mouth at bedtime.  135 tablet  3  . Tamsulosin HCl (FLOMAX) 0.4 MG CAPS TAKE 1 CAPSULE DAILY  90 capsule  1  . travoprost, benzalkonium, (TRAVATAN) 0.004 % ophthalmic solution Place 1 drop into both eyes at bedtime.        . meclizine (ANTIVERT) 12.5 MG tablet Take 12.5 mg by mouth 3 (three) times daily as needed.           Past Medical History  Diagnosis Date  . Other chronic nonalcoholic liver disease   . Diverticulosis of colon (  without mention of hemorrhage)   . Gastritis   . Esophagitis   . Abdominal tenderness, epigastric   . Bradycardia   . Chest pain   . Dizziness   . Hepatotoxicity     drug induced  . Bladder cancer   . Cough   . Colonic polyp   . Allergic rhinitis   . Renal insufficiency   . CAD (coronary artery disease)   . CHF (congestive heart failure)   . Lumbar disc disease   . PUD (peptic ulcer disease)   . Hiatal hernia     with reflux  . Rheumatoid arthritis   . HTN (hypertension)   . HLD (hyperlipidemia)   . GERD (gastroesophageal reflux disease)   . Depression     ROS:   All systems reviewed and negative except as noted in the HPI.   Past Surgical History  Procedure Date  . Adenoidectomy   . Coronary angioplasty     percutaneious transluminal  . Tonsillectomy   . Hip arthroplasty 2002    left  . Bladder surgery   . Lumbar disc surgery 1/08  . Transurethral resection of bladder tumor      Family History  Problem Relation Age of Onset  . Heart attack Mother   . Cancer Father     lung  . Cancer Sister     lung  . Diabetes Sister     . Cancer Brother     lung that spread to brain     History   Social History  . Marital Status: Married    Spouse Name: N/A    Number of Children: N/A  . Years of Education: N/A   Occupational History  . retired from [employer]    Social History Main Topics  . Smoking status: Former Smoker -- 1.5 packs/day for 15 years    Types: Cigarettes    Quit date: 10/07/1985  . Smokeless tobacco: Never Used  . Alcohol Use: No  . Drug Use: No  . Sexually Active: Not on file   Other Topics Concern  . Not on file   Social History Narrative  . No narrative on file     BP 137/75  Pulse 65  Ht 5\' 7"  (1.702 m)  Wt 201 lb (91.173 kg)  BMI 31.48 kg/m2  Physical Exam:  Well appearing NAD HEENT: Unremarkable Neck:  No JVD, no thyromegally Lymphatics:  No adenopathy Back:  No CVA tenderness Lungs:  Clear with no wheezes, rales, or rhonchi. HEART:  Regular rate rhythm, no murmurs, no rubs, no clicks Abd:  soft, positive bowel sounds, no organomegally, no rebound, no guarding Ext:  2 plus pulses, no edema, no cyanosis, no clubbing Skin:  No rashes no nodules Neuro:  CN II through XII intact, motor grossly intact  EKG Normal sinus rhythm  Assess/Plan:

## 2011-04-18 NOTE — Patient Instructions (Signed)
Your physician wants you to follow-up in: YEAR WITH DR Court Joy will receive a reminder letter in the mail two months in advance. If you don't receive a letter, please call our office to schedule the follow-up appointment. Your physician recommends that you continue on your current medications as directed. Please refer to the Current Medication list given to you today.

## 2011-04-28 LAB — POCT I-STAT 3, VENOUS BLOOD GAS (G3P V)
TCO2: 25
pCO2, Ven: 41.2 — ABNORMAL LOW
pH, Ven: 7.364 — ABNORMAL HIGH
pO2, Ven: 40

## 2011-06-27 ENCOUNTER — Telehealth: Payer: Self-pay

## 2011-06-27 NOTE — Telephone Encounter (Signed)
Pt called to check status of surgical clearance form left at office today. Per Dr Jonny Ruiz form was approved and return to [facility]. Can you please contact the pt and advise on the status of these forms.

## 2011-06-27 NOTE — Telephone Encounter (Signed)
Called patient is was requesting a call, left message to call back

## 2011-06-30 NOTE — Telephone Encounter (Signed)
Patient informed. 

## 2011-07-01 ENCOUNTER — Other Ambulatory Visit: Payer: Self-pay | Admitting: Otolaryngology

## 2011-07-01 ENCOUNTER — Encounter (HOSPITAL_COMMUNITY): Payer: Self-pay | Admitting: Pharmacy Technician

## 2011-07-03 ENCOUNTER — Encounter (HOSPITAL_COMMUNITY)
Admission: RE | Admit: 2011-07-03 | Discharge: 2011-07-03 | Disposition: A | Discharge status: Home / Self Care | Payer: Medicare Other | Source: Ambulatory Visit | Attending: Specialist | Admitting: Specialist

## 2011-07-03 ENCOUNTER — Ambulatory Visit (HOSPITAL_COMMUNITY)
Admission: RE | Admit: 2011-07-03 | Discharge: 2011-07-03 | Disposition: A | Discharge status: Home / Self Care | Payer: Medicare Other | Source: Ambulatory Visit | Attending: Otolaryngology | Admitting: Otolaryngology

## 2011-07-03 ENCOUNTER — Encounter (HOSPITAL_COMMUNITY): Payer: Self-pay

## 2011-07-03 DIAGNOSIS — Z01812 Encounter for preprocedural laboratory examination: Secondary | ICD-10-CM | POA: Insufficient documentation

## 2011-07-03 DIAGNOSIS — Z01818 Encounter for other preprocedural examination: Secondary | ICD-10-CM | POA: Insufficient documentation

## 2011-07-03 LAB — COMPREHENSIVE METABOLIC PANEL
Alkaline Phosphatase: 87 U/L (ref 39–117)
BUN: 11 mg/dL (ref 6–23)
Chloride: 96 mEq/L (ref 96–112)
GFR calc Af Amer: 83 mL/min — ABNORMAL LOW (ref >90)
GFR calc non Af Amer: 72 mL/min — ABNORMAL LOW (ref >90)
Glucose, Bld: 92 mg/dL (ref 70–99)
Potassium: 4.3 mEq/L (ref 3.5–5.1)
Total Bilirubin: 0.4 mg/dL (ref 0.3–1.2)

## 2011-07-03 LAB — DIFFERENTIAL
Basophils Absolute: 0 10*3/uL (ref 0.0–0.1)
Basophils Relative: 0 % (ref 0–1)
Eosinophils Absolute: 0.1 10*3/uL (ref 0.0–0.7)
Monocytes Relative: 9 % (ref 3–12)
Neutrophils Relative %: 70 % (ref 43–77)

## 2011-07-03 LAB — CBC
HCT: 45.5 % (ref 39.0–52.0)
Hemoglobin: 16.3 g/dL (ref 13.0–17.0)
MCV: 87.7 fL (ref 78.0–100.0)
WBC: 7.5 10*3/uL (ref 4.0–10.5)

## 2011-07-03 LAB — URINALYSIS, ROUTINE W REFLEX MICROSCOPIC
Bilirubin Urine: NEGATIVE
Ketones, ur: NEGATIVE mg/dL
Nitrite: NEGATIVE
pH: 5 (ref 5.0–8.0)

## 2011-07-03 LAB — PROTIME-INR
INR: 1.02 (ref 0.00–1.49)
Prothrombin Time: 13.6 seconds (ref 11.6–15.2)

## 2011-07-03 LAB — SURGICAL PCR SCREEN: Staphylococcus aureus: INVALID — AB

## 2011-07-03 MED ORDER — CHLORHEXIDINE GLUCONATE 4 % EX LIQD: 60.0000 mL | Freq: Once | CUTANEOUS | Status: DC

## 2011-07-03 NOTE — Patient Instructions (Addendum)
[address] [name]  07/03/2011   Your procedure is scheduled on: 07/09/11  Report to [facility] at 6:30 AM.  Call this number if you have problems the morning of surgery: [phone]   Remember:   Do not eat food:After Midnight.  May have clear liquids:until Midnight .  Clear liquids include soda, tea, black coffee, apple or grape juice, broth.  Take these medicines the morning of surgery with A SIP OF WATER:  OMEPRAZOLE / norvasc / celexa / neurontin / mysoline / flonase nasal spray if needed / use eye drops as usual.   Do not wear jewelry, make-up or nail polish.  Do not wear lotions, powders, or perfumes. You may wear deodorant.  Do not shave 48 hours prior to surgery.  Do not bring valuables to the hospital.  Contacts, dentures or bridgework may not be worn into surgery.  Leave suitcase in the car. After surgery it may be brought to your room.  For patients admitted to the hospital, checkout time is 11:00 AM the day of discharge.   Patients discharged the day of surgery will not be allowed to drive home.  Name and phone number of your driver:   Special Instructions: CHG Shower Use Special Wash: 1/2 bottle night before surgery and 1/2 bottle morning of surgery.   Please read over the following fact sheets that you were given: MRSA Information

## 2011-07-06 LAB — MRSA CULTURE

## 2011-07-07 ENCOUNTER — Other Ambulatory Visit: Payer: Self-pay

## 2011-07-07 DIAGNOSIS — I10 Essential (primary) hypertension: Secondary | ICD-10-CM

## 2011-07-07 MED ORDER — PRIMIDONE 250 MG PO TABS: 125.0000 mg | ORAL_TABLET | Freq: Three times a day (TID) | ORAL | Status: DC

## 2011-07-07 MED ORDER — CITALOPRAM HYDROBROMIDE 10 MG PO TABS: 10.0000 mg | ORAL_TABLET | ORAL | Status: DC

## 2011-07-07 MED ORDER — AMLODIPINE BESYLATE 5 MG PO TABS: 5.0000 mg | ORAL_TABLET | ORAL | Status: DC

## 2011-07-07 MED ORDER — LISINOPRIL 40 MG PO TABS: 20.0000 mg | ORAL_TABLET | Freq: Every day | ORAL | Status: DC

## 2011-07-09 ENCOUNTER — Encounter (HOSPITAL_COMMUNITY): Payer: Self-pay | Admitting: *Deleted

## 2011-07-09 ENCOUNTER — Encounter (HOSPITAL_COMMUNITY): Payer: Self-pay | Admitting: Anesthesiology

## 2011-07-09 ENCOUNTER — Ambulatory Visit (HOSPITAL_COMMUNITY): Payer: Medicare Other

## 2011-07-09 ENCOUNTER — Ambulatory Visit (HOSPITAL_COMMUNITY): Payer: Medicare Other | Admitting: Anesthesiology

## 2011-07-09 ENCOUNTER — Encounter (HOSPITAL_COMMUNITY)
Admission: RE | Disposition: A | Discharge status: Home / Self Care | Payer: Self-pay | Source: Ambulatory Visit | Attending: Specialist

## 2011-07-09 ENCOUNTER — Inpatient Hospital Stay (HOSPITAL_COMMUNITY)
Admission: RE | Admit: 2011-07-09 | Discharge: 2011-07-11 | DRG: 491 | Disposition: A | Discharge status: Home / Self Care | Payer: Medicare Other | Source: Ambulatory Visit | Attending: Specialist | Admitting: Specialist

## 2011-07-09 DIAGNOSIS — M48061 Spinal stenosis, lumbar region without neurogenic claudication: Principal | ICD-10-CM | POA: Diagnosis present

## 2011-07-09 DIAGNOSIS — F329 Major depressive disorder, single episode, unspecified: Secondary | ICD-10-CM | POA: Diagnosis present

## 2011-07-09 DIAGNOSIS — F3289 Other specified depressive episodes: Secondary | ICD-10-CM | POA: Diagnosis present

## 2011-07-09 DIAGNOSIS — K449 Diaphragmatic hernia without obstruction or gangrene: Secondary | ICD-10-CM | POA: Diagnosis present

## 2011-07-09 DIAGNOSIS — I10 Essential (primary) hypertension: Secondary | ICD-10-CM | POA: Diagnosis present

## 2011-07-09 DIAGNOSIS — M5126 Other intervertebral disc displacement, lumbar region: Secondary | ICD-10-CM | POA: Diagnosis present

## 2011-07-09 DIAGNOSIS — I2510 Atherosclerotic heart disease of native coronary artery without angina pectoris: Secondary | ICD-10-CM | POA: Diagnosis present

## 2011-07-09 HISTORY — PX: LUMBAR LAMINECTOMY/DECOMPRESSION MICRODISCECTOMY: SHX5026

## 2011-07-09 SURGERY — LUMBAR LAMINECTOMY/DECOMPRESSION MICRODISCECTOMY
Anesthesia: General | Site: Back | Laterality: Left | Wound class: Clean

## 2011-07-09 MED ORDER — VITAMIN C 500 MG PO TABS
500.0000 mg | ORAL_TABLET | Freq: Two times a day (BID) | ORAL | Status: DC
  Administered 2011-07-09 – 2011-07-11 (×5): 500 mg via ORAL
  Filled 2011-07-09 (×6): qty 1

## 2011-07-09 MED ORDER — GABAPENTIN 300 MG PO CAPS
300.0000 mg | ORAL_CAPSULE | Freq: Three times a day (TID) | ORAL | Status: DC
  Administered 2011-07-09 – 2011-07-11 (×6): 300 mg via ORAL
  Filled 2011-07-09 (×7): qty 1

## 2011-07-09 MED ORDER — SIMVASTATIN 40 MG PO TABS
60.0000 mg | ORAL_TABLET | Freq: Every day | ORAL | Status: DC
  Administered 2011-07-09: 60 mg via ORAL
  Filled 2011-07-09 (×2): qty 1

## 2011-07-09 MED ORDER — CEFAZOLIN SODIUM 1-5 GM-% IV SOLN
1.0000 g | Freq: Three times a day (TID) | INTRAVENOUS | Status: AC
  Administered 2011-07-09 (×2): 1 g via INTRAVENOUS
  Filled 2011-07-09 (×2): qty 50

## 2011-07-09 MED ORDER — CLINDAMYCIN PHOSPHATE 600 MG/50ML IV SOLN
INTRAVENOUS | Status: DC | PRN
  Administered 2011-07-09: 600 mg via INTRAVENOUS

## 2011-07-09 MED ORDER — THROMBIN 5000 UNITS EX KIT
PACK | CUTANEOUS | Status: DC | PRN
  Administered 2011-07-09: 10000 [IU] via TOPICAL

## 2011-07-09 MED ORDER — HYPROMELLOSE (GONIOSCOPIC) 2.5 % OP SOLN: 1.0000 [drp] | Freq: Two times a day (BID) | OPHTHALMIC | Status: DC | PRN

## 2011-07-09 MED ORDER — OXYCODONE-ACETAMINOPHEN 5-325 MG PO TABS
1.0000 | ORAL_TABLET | ORAL | Status: DC | PRN
  Administered 2011-07-09: 1 via ORAL
  Administered 2011-07-10: 2 via ORAL
  Administered 2011-07-10: 1 via ORAL
  Administered 2011-07-10: 2 via ORAL
  Administered 2011-07-11 (×2): 1 via ORAL
  Filled 2011-07-09: qty 2
  Filled 2011-07-09: qty 1
  Filled 2011-07-09: qty 2
  Filled 2011-07-09 (×3): qty 1

## 2011-07-09 MED ORDER — METHOCARBAMOL 100 MG/ML IJ SOLN
500.0000 mg | Freq: Four times a day (QID) | INTRAVENOUS | Status: DC | PRN
  Administered 2011-07-09: 500 mg via INTRAVENOUS
  Filled 2011-07-09: qty 5

## 2011-07-09 MED ORDER — GABAPENTIN 300 MG PO CAPS
600.0000 mg | ORAL_CAPSULE | Freq: Every day | ORAL | Status: DC
  Administered 2011-07-09 – 2011-07-10 (×2): 600 mg via ORAL
  Filled 2011-07-09 (×3): qty 2

## 2011-07-09 MED ORDER — CEFAZOLIN SODIUM 1-5 GM-% IV SOLN
INTRAVENOUS | Status: DC | PRN
  Administered 2011-07-09: 2 g via INTRAVENOUS

## 2011-07-09 MED ORDER — LORATADINE 10 MG PO TABS
10.0000 mg | ORAL_TABLET | Freq: Every day | ORAL | Status: DC
  Administered 2011-07-10 – 2011-07-11 (×2): 10 mg via ORAL
  Filled 2011-07-09 (×3): qty 1

## 2011-07-09 MED ORDER — PANTOPRAZOLE SODIUM 40 MG PO TBEC
40.0000 mg | DELAYED_RELEASE_TABLET | Freq: Every day | ORAL | Status: DC
  Administered 2011-07-10 – 2011-07-11 (×2): 40 mg via ORAL
  Filled 2011-07-09 (×2): qty 1

## 2011-07-09 MED ORDER — METHOCARBAMOL 500 MG PO TABS
500.0000 mg | ORAL_TABLET | Freq: Four times a day (QID) | ORAL | Status: DC | PRN
  Administered 2011-07-10 – 2011-07-11 (×2): 500 mg via ORAL
  Filled 2011-07-09 (×2): qty 1

## 2011-07-09 MED ORDER — AMLODIPINE BESYLATE 5 MG PO TABS
5.0000 mg | ORAL_TABLET | Freq: Every day | ORAL | Status: DC
  Administered 2011-07-10 – 2011-07-11 (×2): 5 mg via ORAL
  Filled 2011-07-09 (×2): qty 1

## 2011-07-09 MED ORDER — PHENOL 1.4 % MT LIQD: 1.0000 | OROMUCOSAL | Status: DC | PRN

## 2011-07-09 MED ORDER — CEFAZOLIN SODIUM-DEXTROSE 2-3 GM-% IV SOLR: 2.0000 g | Freq: Once | INTRAVENOUS | Status: DC

## 2011-07-09 MED ORDER — ETOMIDATE 2 MG/ML IV SOLN
INTRAVENOUS | Status: DC | PRN
  Administered 2011-07-09: 10 mg via INTRAVENOUS

## 2011-07-09 MED ORDER — ONDANSETRON HCL 4 MG/2ML IJ SOLN
4.0000 mg | INTRAMUSCULAR | Status: DC | PRN
  Administered 2011-07-09: 4 mg via INTRAVENOUS
  Filled 2011-07-09: qty 2

## 2011-07-09 MED ORDER — LACTATED RINGERS IV SOLN: INTRAVENOUS | Status: DC

## 2011-07-09 MED ORDER — SODIUM CHLORIDE 0.9 % IR SOLN
Status: DC | PRN
  Administered 2011-07-09: 10:00:00

## 2011-07-09 MED ORDER — NEOSTIGMINE METHYLSULFATE 1 MG/ML IJ SOLN
INTRAMUSCULAR | Status: DC | PRN
  Administered 2011-07-09: 4 mg via INTRAVENOUS

## 2011-07-09 MED ORDER — ACETAMINOPHEN 325 MG PO TABS
650.0000 mg | ORAL_TABLET | ORAL | Status: DC | PRN
  Administered 2011-07-10: 650 mg via ORAL
  Filled 2011-07-09: qty 2

## 2011-07-09 MED ORDER — FAMOTIDINE 20 MG PO TABS
20.0000 mg | ORAL_TABLET | Freq: Two times a day (BID) | ORAL | Status: DC
  Administered 2011-07-09 – 2011-07-11 (×4): 20 mg via ORAL
  Filled 2011-07-09 (×6): qty 1

## 2011-07-09 MED ORDER — LACTATED RINGERS IV SOLN
INTRAVENOUS | Status: DC
  Administered 2011-07-09 (×2): via INTRAVENOUS

## 2011-07-09 MED ORDER — BUPIVACAINE-EPINEPHRINE 0.5% -1:200000 IJ SOLN
INTRAMUSCULAR | Status: DC | PRN
  Administered 2011-07-09: 30 mL

## 2011-07-09 MED ORDER — NITROGLYCERIN 0.4 MG SL SUBL: 0.4000 mg | SUBLINGUAL_TABLET | SUBLINGUAL | Status: DC | PRN

## 2011-07-09 MED ORDER — CEFAZOLIN SODIUM-DEXTROSE 2-3 GM-% IV SOLR
2.0000 g | Freq: Three times a day (TID) | INTRAVENOUS | Status: DC
  Filled 2011-07-09 (×3): qty 50

## 2011-07-09 MED ORDER — CITALOPRAM HYDROBROMIDE 10 MG PO TABS
10.0000 mg | ORAL_TABLET | Freq: Every day | ORAL | Status: DC
  Administered 2011-07-10 – 2011-07-11 (×2): 10 mg via ORAL
  Filled 2011-07-09 (×3): qty 1

## 2011-07-09 MED ORDER — SODIUM CHLORIDE 0.9 % IJ SOLN: 3.0000 mL | INTRAMUSCULAR | Status: DC | PRN

## 2011-07-09 MED ORDER — SODIUM CHLORIDE 0.9 % IJ SOLN
3.0000 mL | Freq: Two times a day (BID) | INTRAMUSCULAR | Status: DC
  Administered 2011-07-10: 3 mL via INTRAVENOUS

## 2011-07-09 MED ORDER — ACETAMINOPHEN 650 MG RE SUPP: 650.0000 mg | RECTAL | Status: DC | PRN

## 2011-07-09 MED ORDER — PROPOFOL 10 MG/ML IV EMUL
INTRAVENOUS | Status: DC | PRN
  Administered 2011-07-09: 100 mg via INTRAVENOUS

## 2011-07-09 MED ORDER — FENTANYL CITRATE 0.05 MG/ML IJ SOLN
INTRAMUSCULAR | Status: DC | PRN
  Administered 2011-07-09: 100 ug via INTRAVENOUS
  Administered 2011-07-09: 50 ug via INTRAVENOUS

## 2011-07-09 MED ORDER — PROMETHAZINE HCL 25 MG/ML IJ SOLN: 6.2500 mg | INTRAMUSCULAR | Status: DC | PRN

## 2011-07-09 MED ORDER — MORPHINE SULFATE 4 MG/ML IJ SOLN
1.0000 mg | INTRAMUSCULAR | Status: DC | PRN
  Administered 2011-07-09 (×2): 4 mg via INTRAVENOUS
  Administered 2011-07-09: 2 mg via INTRAVENOUS
  Filled 2011-07-09 (×3): qty 1

## 2011-07-09 MED ORDER — PRIMIDONE 250 MG PO TABS
125.0000 mg | ORAL_TABLET | Freq: Three times a day (TID) | ORAL | Status: DC
  Administered 2011-07-09 – 2011-07-11 (×6): 125 mg via ORAL
  Filled 2011-07-09 (×8): qty 1

## 2011-07-09 MED ORDER — LISINOPRIL 20 MG PO TABS
20.0000 mg | ORAL_TABLET | Freq: Every day | ORAL | Status: DC
  Administered 2011-07-09 – 2011-07-10 (×2): 20 mg via ORAL
  Filled 2011-07-09 (×3): qty 1

## 2011-07-09 MED ORDER — MENTHOL 3 MG MT LOZG
1.0000 | LOZENGE | OROMUCOSAL | Status: DC | PRN
  Filled 2011-07-09: qty 9

## 2011-07-09 MED ORDER — GLYCOPYRROLATE 0.2 MG/ML IJ SOLN
INTRAMUSCULAR | Status: DC | PRN
  Administered 2011-07-09: 0.2 mg via INTRAVENOUS
  Administered 2011-07-09: .6 mg via INTRAVENOUS

## 2011-07-09 MED ORDER — ROCURONIUM BROMIDE 100 MG/10ML IV SOLN
INTRAVENOUS | Status: DC | PRN
  Administered 2011-07-09: 45 mg via INTRAVENOUS

## 2011-07-09 MED ORDER — FLUTICASONE PROPIONATE 50 MCG/ACT NA SUSP: 2.0000 | Freq: Every day | NASAL | Status: DC | PRN

## 2011-07-09 MED ORDER — EPHEDRINE SULFATE 50 MG/ML IJ SOLN
INTRAMUSCULAR | Status: DC | PRN
  Administered 2011-07-09 (×2): 7.5 mg via INTRAVENOUS

## 2011-07-09 MED ORDER — TRAVOPROST 0.004 % OP SOLN
1.0000 [drp] | Freq: Every day | OPHTHALMIC | Status: DC
  Administered 2011-07-09 – 2011-07-10 (×2): 1 [drp] via OPHTHALMIC
  Filled 2011-07-09: qty 0.1
  Filled 2011-07-09: qty 2.5
  Filled 2011-07-09 (×3): qty 0.1

## 2011-07-09 MED ORDER — LACTATED RINGERS IV SOLN
INTRAVENOUS | Status: DC
  Administered 2011-07-09 – 2011-07-10 (×3): via INTRAVENOUS

## 2011-07-09 MED ORDER — SODIUM CHLORIDE 0.9 % IV SOLN: 250.0000 mL | INTRAVENOUS | Status: DC

## 2011-07-09 MED ORDER — HYDROMORPHONE HCL PF 1 MG/ML IJ SOLN
0.2500 mg | INTRAMUSCULAR | Status: DC | PRN
  Administered 2011-07-09: 0.5 mg via INTRAVENOUS
  Administered 2011-07-09 (×2): 0.25 mg via INTRAVENOUS
  Administered 2011-07-09: 0.5 mg via INTRAVENOUS

## 2011-07-09 MED ORDER — GABAPENTIN 300 MG PO CAPS: 300.0000 mg | ORAL_CAPSULE | Freq: Four times a day (QID) | ORAL | Status: DC

## 2011-07-09 MED ORDER — TAMSULOSIN HCL 0.4 MG PO CAPS
0.4000 mg | ORAL_CAPSULE | Freq: Every day | ORAL | Status: DC
  Administered 2011-07-10 – 2011-07-11 (×2): 0.4 mg via ORAL
  Filled 2011-07-09 (×3): qty 1

## 2011-07-09 MED ORDER — MEPERIDINE HCL 25 MG/ML IJ SOLN: 6.2500 mg | INTRAMUSCULAR | Status: DC | PRN

## 2011-07-09 MED ORDER — PHENYLEPHRINE HCL 10 MG/ML IJ SOLN
10.0000 mg | INTRAMUSCULAR | Status: DC | PRN
  Administered 2011-07-09: 50 ug/min via INTRAVENOUS

## 2011-07-09 SURGICAL SUPPLY — 50 items
BAG ZIPLOCK 12X15 (MISCELLANEOUS) ×2 IMPLANT
BENZOIN TINCTURE PRP APPL 2/3 (GAUZE/BANDAGES/DRESSINGS) ×4 IMPLANT
CHLORAPREP W/TINT 26ML (MISCELLANEOUS) IMPLANT
CLEANER TIP ELECTROSURG 2X2 (MISCELLANEOUS) ×2 IMPLANT
CLOTH BEACON ORANGE TIMEOUT ST (SAFETY) ×2 IMPLANT
DECANTER SPIKE VIAL GLASS SM (MISCELLANEOUS) ×2 IMPLANT
DRAPE MICROSCOPE LEICA (MISCELLANEOUS) ×2 IMPLANT
DRAPE POUCH INSTRU U-SHP 10X18 (DRAPES) ×2 IMPLANT
DRAPE SURG 17X11 SM STRL (DRAPES) ×2 IMPLANT
DRSG EMULSION OIL 3X3 NADH (GAUZE/BANDAGES/DRESSINGS) ×2 IMPLANT
DRSG PAD ABDOMINAL 8X10 ST (GAUZE/BANDAGES/DRESSINGS) IMPLANT
DRSG TELFA 4X5 ISLAND ADH (GAUZE/BANDAGES/DRESSINGS) IMPLANT
DURAFORM SPONGE 2X2 SINGLE (Neuro Prosthesis/Implant) ×2 IMPLANT
DURAPREP 26ML APPLICATOR (WOUND CARE) ×2 IMPLANT
ELECT REM PT RETURN 9FT ADLT (ELECTROSURGICAL) ×2
ELECTRODE REM PT RTRN 9FT ADLT (ELECTROSURGICAL) ×1 IMPLANT
GAUZE SPONGE 4X4 12PLY STRL LF (GAUZE/BANDAGES/DRESSINGS) ×2 IMPLANT
GLOVE BIOGEL PI IND STRL 6.5 (GLOVE) ×1 IMPLANT
GLOVE BIOGEL PI IND STRL 8 (GLOVE) ×2 IMPLANT
GLOVE BIOGEL PI INDICATOR 6.5 (GLOVE) ×1
GLOVE BIOGEL PI INDICATOR 8 (GLOVE) ×2
GLOVE ECLIPSE 6.5 STRL STRAW (GLOVE) ×2 IMPLANT
GLOVE INDICATOR 6.5 STRL GRN (GLOVE) IMPLANT
GLOVE SURG SS PI 8.0 STRL IVOR (GLOVE) ×6 IMPLANT
KIT BASIN OR (CUSTOM PROCEDURE TRAY) ×2 IMPLANT
KIT POSITIONING SURG ANDREWS (MISCELLANEOUS) ×2 IMPLANT
MANIFOLD NEPTUNE II (INSTRUMENTS) ×2 IMPLANT
NEEDLE SPNL 18GX3.5 QUINCKE PK (NEEDLE) ×6 IMPLANT
PATTIES SURGICAL .5 X.5 (GAUZE/BANDAGES/DRESSINGS) ×4 IMPLANT
PATTIES SURGICAL .75X.75 (GAUZE/BANDAGES/DRESSINGS) ×6 IMPLANT
PATTIES SURGICAL 1X1 (DISPOSABLE) ×2 IMPLANT
SPONGE SURGIFOAM ABS GEL 100 (HEMOSTASIS) ×2 IMPLANT
STAPLER VISISTAT (STAPLE) IMPLANT
STRIP CLOSURE SKIN 1/2X4 (GAUZE/BANDAGES/DRESSINGS) IMPLANT
SUT PROLENE 3 0 PS 2 (SUTURE) IMPLANT
SUT PROLENE 6 0 C 1 30 (SUTURE) ×2 IMPLANT
SUT VIC AB 0 CT1 27 (SUTURE)
SUT VIC AB 0 CT1 27XBRD ANTBC (SUTURE) IMPLANT
SUT VIC AB 1 CT1 27 (SUTURE) ×1
SUT VIC AB 1 CT1 27XBRD ANTBC (SUTURE) ×1 IMPLANT
SUT VIC AB 1-0 CT2 27 (SUTURE) IMPLANT
SUT VIC AB 2-0 CT1 27 (SUTURE) ×1
SUT VIC AB 2-0 CT1 TAPERPNT 27 (SUTURE) ×1 IMPLANT
SUT VIC AB 2-0 CT2 27 (SUTURE) ×2 IMPLANT
SUT VICRYL 0 27 CT2 27 ABS (SUTURE) IMPLANT
SUT VICRYL 0 UR6 27IN ABS (SUTURE) ×2 IMPLANT
SYRINGE 10CC LL (SYRINGE) ×4 IMPLANT
TAPE CLOTH SURG 4X10 WHT LF (GAUZE/BANDAGES/DRESSINGS) ×2 IMPLANT
TRAY LAMINECTOMY (CUSTOM PROCEDURE TRAY) ×2 IMPLANT
YANKAUER SUCT BULB TIP NO VENT (SUCTIONS) ×2 IMPLANT

## 2011-07-09 NOTE — H&P (Signed)
[name] is an 75 y.o. male.   Chief Complaint: left leg pain HPI: 75yo with left leg pain due to stenosis.  Past Medical History  Diagnosis Date  . Other chronic nonalcoholic liver disease   . Diverticulosis of colon (without mention of hemorrhage)   . Gastritis   . Esophagitis   . Abdominal tenderness, epigastric   . Bradycardia   . Chest pain   . Dizziness   . Hepatotoxicity     drug induced  . Bladder cancer   . Cough   . Colonic polyp   . Allergic rhinitis   . Renal insufficiency   . CAD (coronary artery disease)   . CHF (congestive heart failure)   . Lumbar disc disease   . PUD (peptic ulcer disease)   . Hiatal hernia     with reflux  . Rheumatoid arthritis   . HTN (hypertension)   . HLD (hyperlipidemia)   . GERD (gastroesophageal reflux disease)   . Depression   . S/P angioplasty   . Tremors of nervous system     Past Surgical History  Procedure Date  . Adenoidectomy   . Coronary angioplasty     percutaneious transluminal  . Tonsillectomy   . Bladder surgery   . Lumbar disc surgery 1/08  . Transurethral resection of bladder tumor     Family History  Problem Relation Age of Onset  . Heart attack Mother   . Cancer Father     lung  . Cancer Sister     lung  . Diabetes Sister   . Cancer Brother     lung that spread to brain   Social History:  reports that he quit smoking about 25 years ago. His smoking use included Cigarettes. He has a 22.5 pack-year smoking history. He has never used smokeless tobacco. He reports that he does not drink alcohol or use illicit drugs.  Allergies:  Allergies  Allergen Reactions  . Hydrochlorothiazide     REACTION: hyponatremia  . Sulfonamide Derivatives     REACTION: rash    Medications Prior to Admission  Medication Dose Route Frequency Provider Last Rate Last Dose  . ceFAZolin (ANCEF) IVPB 2 g/50 mL premix  2 g Intravenous Once [employer]      . lactated ringers infusion   Intravenous Continuous  Liam Graham, PA       Medications Prior to Admission  Medication Sig Dispense Refill  . clobetasol (TEMOVATE) 0.05 % ointment Apply 1 application topically daily as needed. Rash       . acetaminophen-codeine (TYLENOL #3) 300-30 MG per tablet Take 1 tablet by mouth 2 (two) times daily as needed. Pain       . Ascorbic Acid (VITAMIN C) 500 MG tablet Take 500 mg by mouth 2 (two) times daily.       . cetirizine (ZYRTEC) 10 MG tablet Take 10 mg by mouth daily.        . fluticasone (FLONASE) 50 MCG/ACT nasal spray Place 2 sprays into the nose daily as needed. Allergies       . gabapentin (NEURONTIN) 300 MG capsule Take 300-600 mg by mouth 4 (four) times daily. 1 tab by mouth three times daily, and 2 tabs by mouth at bedtime      . hydrocortisone (ANUSOL-HC) 25 MG suppository Place 25 mg rectally 2 (two) times daily as needed. Itching       . nitroGLYCERIN (NITROSTAT) 0.4 MG SL tablet Place 0.4 mg under  the tongue every 5 (five) minutes as needed. Chest pain        . omeprazole (PRILOSEC) 20 MG capsule Take 2 capsules (40 mg total) by mouth daily.  180 capsule  3  . ranitidine (ZANTAC) 300 MG capsule Take 300 mg by mouth every evening.       . simvastatin (ZOCOR) 40 MG tablet Take 1.5 tablets (60 mg total) by mouth at bedtime.  135 tablet  3  . Tamsulosin HCl (FLOMAX) 0.4 MG CAPS       . travoprost, benzalkonium, (TRAVATAN) 0.004 % ophthalmic solution Place 1 drop into both eyes at bedtime.          No results found for this or any previous visit (from the past 48 hour(s)). No results found.  Review of Systems  Constitutional: Negative.   Eyes: Negative.   Respiratory: Negative.   Cardiovascular: Negative.   Gastrointestinal: Negative.   Genitourinary: Negative.   Musculoskeletal: Positive for back pain.  Skin: Negative.   Neurological: Positive for focal weakness.  Endo/Heme/Allergies: Negative.   Psychiatric/Behavioral: Negative.     Blood pressure 137/63, pulse 68,  temperature 98.1 F (36.7 C), resp. rate 20, SpO2 94.00%. Physical Exam  Constitutional: He appears well-developed.  HENT:  Head: Normocephalic.  Eyes: Pupils are equal, round, and reactive to light.  Neck: Normal range of motion.  Cardiovascular: Normal rate.   Respiratory: Effort normal.  GI: Soft.  Musculoskeletal:       Positive straight leg raise left.  Neurological: He displays abnormal reflex.       EHL 4/5 left. Achilles trace left. -DVT. 1+ pulses left  Skin: Skin is warm.  Psychiatric: He has a normal mood and affect.     Assessment/Plan L5 S1 radiculopathy due to stenosis L5S1. Plan decompression. Risks and benefits discussed.  [name] C 07/09/2011, 8:11 AM

## 2011-07-09 NOTE — Transfer of Care (Signed)
Immediate Anesthesia Transfer of Care Note  Patient: [name]  Procedure(s) Performed:  LUMBAR LAMINECTOMY/DECOMPRESSION MICRODISCECTOMY - Decompression L5 - S1 on the Left and repair of dura  Patient Location: PACU  Anesthesia Type: General  Level of Consciousness: awake, alert , oriented and patient cooperative  Airway & Oxygen Therapy: Patient Spontanous Breathing and Patient connected to face mask oxygen  Post-op Assessment: Report given to PACU RN and Post -op Vital signs reviewed and stable  Post vital signs: Reviewed and stable  Complications: No apparent anesthesia complications

## 2011-07-09 NOTE — Anesthesia Postprocedure Evaluation (Signed)
  Anesthesia Post-op Note  Patient: [name]  Procedure(s) Performed:  LUMBAR LAMINECTOMY/DECOMPRESSION MICRODISCECTOMY - Decompression L5 - S1 on the Left and repair of dura  Patient Location: PACU  Anesthesia Type: General  Level of Consciousness: awake and alert   Airway and Oxygen Therapy: Patient Spontanous Breathing  Post-op Pain: mild  Post-op Assessment: Post-op Vital signs reviewed, Patient's Cardiovascular Status Stable, Respiratory Function Stable, Patent Airway and No signs of Nausea or vomiting  Post-op Vital Signs: stable  Complications: No apparent anesthesia complications

## 2011-07-09 NOTE — Brief Op Note (Signed)
07/09/2011  10:52 AM  PATIENT:  [name]  75 y.o. male  PRE-OPERATIVE DIAGNOSIS:  Stenosis and Herniated Disc  POST-OPERATIVE DIAGNOSIS:  Lumbar five sacral one Stenosis and Herniated Nucleus Pulposus  PROCEDURE:  Procedure(s): LUMBAR LAMINECTOMY/DECOMPRESSION MICRODISCECTOMY  SURGEON:  Surgeon(s): Tinnie Gens C Aison Malveaux Ronald A Gioffre  PHYSICIAN ASSISTANT:   ASSISTANTS: gioffre   ANESTHESIA:   epidural  EBL:  Total I/O In: 1000 [I.V.:1000] Out: 10 [Blood:10]  BLOOD ADMINISTERED:none  DRAINS: none   LOCAL MEDICATIONS USED:  MARCAINE 15CC  SPECIMEN:  No Specimen  DISPOSITION OF SPECIMEN:  N/A  COUNTS:  YES  TOURNIQUET:  * No tourniquets in log *  DICTATION: .Other Dictation: Dictation Number [id number]  PLAN OF CARE: Admit to inpatient   PATIENT DISPOSITION:  PACU - hemodynamically stable.   Delay start of Pharmacological VTE agent (>24hrs) due to surgical blood loss or risk of bleeding:  {YES/NO/NOT APPLICABLE:20182

## 2011-07-09 NOTE — Anesthesia Preprocedure Evaluation (Addendum)
Anesthesia Evaluation  Patient identified by MRN, date of birth, ID band Patient awake    Reviewed: Allergy & Precautions, H&P , NPO status , Patient's Chart, lab work & pertinent test results  History of Anesthesia Complications Negative for: history of anesthetic complications  Airway Mallampati: II TM Distance: >3 FB Neck ROM: Full    Dental No notable dental hx. (+) Edentulous Upper and Edentulous Lower   Pulmonary neg pulmonary ROS,  clear to auscultation  Pulmonary exam normal       Cardiovascular hypertension, Pt. on medications + CAD, +CHF and neg cardio ROS - dysrhythmias Regular Normal    Neuro/Psych PSYCHIATRIC DISORDERS Depression Negative Neurological ROS  Negative Psych ROS   GI/Hepatic negative GI ROS, Neg liver ROS, hiatal hernia, PUD, GERD-  ,  Endo/Other  Negative Endocrine ROS  Renal/GU negative Renal ROS  Genitourinary negative   Musculoskeletal negative musculoskeletal ROS (+) Arthritis -, Rheumatoid disorders,    Abdominal   Peds negative pediatric ROS (+)  Hematology negative hematology ROS (+)   Anesthesia Other Findings   Reproductive/Obstetrics negative OB ROS                          Anesthesia Physical Anesthesia Plan  ASA: III  Anesthesia Plan: General   Post-op Pain Management:    Induction: Intravenous  Airway Management Planned: Oral ETT  Additional Equipment:   Intra-op Plan:   Post-operative Plan: Extubation in OR  Informed Consent: I have reviewed the patients History and Physical, chart, labs and discussed the procedure including the risks, benefits and alternatives for the proposed anesthesia with the patient or authorized representative who has indicated his/her understanding and acceptance.   Dental advisory given  Plan Discussed with: CRNA  Anesthesia Plan Comments:         Anesthesia Quick Evaluation

## 2011-07-10 LAB — BASIC METABOLIC PANEL
Calcium: 8.9 mg/dL (ref 8.4–10.5)
GFR calc non Af Amer: 81 mL/min — ABNORMAL LOW (ref >90)
Glucose, Bld: 107 mg/dL — ABNORMAL HIGH (ref 70–99)
Sodium: 136 mEq/L (ref 135–145)

## 2011-07-10 LAB — CBC
MCH: 30.4 pg (ref 26.0–34.0)
MCHC: 33.8 g/dL (ref 30.0–36.0)
Platelets: 196 10*3/uL (ref 150–400)

## 2011-07-10 MED ORDER — ROSUVASTATIN CALCIUM 10 MG PO TABS
10.0000 mg | ORAL_TABLET | Freq: Every day | ORAL | Status: DC
  Administered 2011-07-10: 10 mg via ORAL
  Filled 2011-07-10 (×2): qty 1

## 2011-07-10 NOTE — Progress Notes (Signed)
CSW consulted for D/C planning. PN reviewed. Pt plans to return home upon D/C. RNCM is assisting with D/C planning.

## 2011-07-10 NOTE — Progress Notes (Addendum)
Physical Therapy Evaluation Patient Details Name: [name] MRN: [id number] DOB: 20-May-1936 Today's Date: 07/10/2011  [id number] [id number]  Problem List:  Patient Active Problem List  Diagnoses  . BLADDER CANCER  . HYPERLIPIDEMIA  . HYPERTENSION  . BRADYCARDIA  . ALLERGIC RHINITIS  . GERD  . HIATAL HERNIA WITH REFLUX  . DIVERTICULOSIS OF COLON  . RECTAL BLEEDING  . FATTY LIVER DISEASE  . RENAL INSUFFICIENCY  . Rheumatoid arthritis  . DISC DISEASE, LUMBAR  . BUNION, RIGHT FOOT  . DIZZINESS  . CHEST PAIN  . Diarrhea  . COLONIC POLYPS, HX OF  . ADENOIDECTOMY, HX OF  . DEPRESSION  . RENAL ARTERY STENOSIS  . FATIGUE  . Preventative health care    Past Medical History:  Past Medical History  Diagnosis Date  . Other chronic nonalcoholic liver disease   . Diverticulosis of colon (without mention of hemorrhage)   . Gastritis   . Esophagitis   . Abdominal tenderness, epigastric   . Bradycardia   . Chest pain   . Dizziness   . Hepatotoxicity     drug induced  . Bladder cancer   . Cough   . Colonic polyp   . Allergic rhinitis   . Renal insufficiency   . CAD (coronary artery disease)   . CHF (congestive heart failure)   . Lumbar disc disease   . PUD (peptic ulcer disease)   . Hiatal hernia     with reflux  . Rheumatoid arthritis   . HTN (hypertension)   . HLD (hyperlipidemia)   . GERD (gastroesophageal reflux disease)   . Depression   . S/P angioplasty   . Tremors of nervous system    Past Surgical History:  Past Surgical History  Procedure Date  . Adenoidectomy   . Coronary angioplasty     percutaneious transluminal  . Tonsillectomy   . Bladder surgery   . Lumbar disc surgery 1/08  . Transurethral resection of bladder tumor     PT Assessment/Plan/Recommendation PT Assessment Clinical Impression Statement: Pt presents with lumbar laminectomy POD 1.  Tolerates ambulation well.  Requires some assist to the sitting position due to increased pain, however pt  is aware of back precautions and proper bed mobility techniques.  PT to trial cane with ambulation.  Pt would benefit from skilled PT to address deficits.  No follow up therapy required at D/C.  PT Recommendation/Assessment: Patient will need skilled PT in the acute care venue PT Problem List: Decreased activity tolerance;Decreased knowledge of use of DME Barriers to Discharge: None PT Therapy Diagnosis : Abnormality of gait;Acute pain PT Plan PT Frequency: 5X/week PT Treatment/Interventions: DME instruction;Gait training;Stair training;Functional mobility training;Therapeutic activities;Therapeutic exercise;Patient/family education PT Recommendation Follow Up Recommendations: None PT Goals  Acute Rehab PT Goals PT Goal Formulation: With patient Time For Goal Achievement: 5 days Pt will go Supine/Side to Sit: with modified independence PT Goal: Supine/Side to Sit - Progress: Not met Pt will go Sit to Supine/Side: with modified independence PT Goal: Sit to Supine/Side - Progress: Not met Pt will go Sit to Stand: with modified independence PT Goal: Sit to Stand - Progress: Not met Pt will go Stand to Sit: with modified independence PT Goal: Stand to Sit - Progress: Not met Pt will Ambulate: >150 feet;with modified independence;with least restrictive assistive device PT Goal: Ambulate - Progress: Not met Pt will Go Up / Down Stairs: 1-2 stairs;with least restrictive assistive device;with supervision PT Goal: Up/Down Stairs - Progress: Not met  PT  Evaluation Precautions/Restrictions  Precautions Precautions: Back Precaution Booklet Issued: Yes (comment) Precaution Comments: Provided pt with handout on proper positioning, posture, body mechanics, etc.  Restrictions Weight Bearing Restrictions: No Prior Functioning  Home Living Lives With: Spouse Receives Help From: Family Type of Home: House Home Layout: One level Home Access: Other (comment) (has 4" stoop to get in ) Home  Adaptive Equipment: Straight cane Prior Function Level of Independence: Independent with gait;Independent with basic ADLs;Independent with transfers Driving: Yes Vocation: Retired Producer, television/film/video: Awake/alert Overall Cognitive Status: Appears within functional limits for tasks assessed Orientation Level: Oriented X4 Sensation/Coordination Sensation Light Touch: Appears Intact Proprioception: Appears Intact Coordination Gross Motor Movements are Fluid and Coordinated: Yes Extremity Assessment RLE Assessment RLE Assessment: Within Functional Limits LLE Assessment LLE Assessment: Within Functional Limits Mobility (including Balance) Bed Mobility Bed Mobility: Yes Rolling Right: 5: Supervision Rolling Right Details (indicate cue type and reason): Required cues for log roll technique in order to maintain back precautions Right Sidelying to Sit: 4: Min assist;HOB flat;With rails Right Sidelying to Sit Details (indicate cue type and reason): Required assist from therapist in order to raise trunk to upright position.  Cues for proper use of B UE.  Sitting - Scoot to Edge of Bed: 5: Supervision Sitting - Scoot to [place] of Bed Details (indicate cue type and reason): Required cues for hip positioning on EOB Transfers Transfers: Yes Sit to Stand: 4: Min assist;With upper extremity assist;From bed Sit to Stand Details (indicate cue type and reason): Required assist to elevate trunk with cues for hand placement on bed/RW Stand to Sit: 4: Min assist;With upper extremity assist;To chair/3-in-1 Stand to Sit Details: MinGuard for safety with cues for hand placement Ambulation/Gait Ambulation/Gait: Yes Ambulation/Gait Assistance: 4: Min assist Ambulation/Gait Assistance Details (indicate cue type and reason): MinGuard for safety with cues for RW technique/sequencing and for larger step length Ambulation Distance (Feet): 120 Feet Assistive device: Rolling walker Gait  Pattern: Step-through pattern;Decreased step length - right;Decreased step length - left Gait velocity: WFL however with decreased step length B Stairs: No Wheelchair Mobility Wheelchair Mobility: No    Exercise    End of Session PT - End of Session Activity Tolerance: Patient tolerated treatment well Patient left: in chair;with call bell in reach Nurse Communication: Other (comment) (RN notified of O2 sats while ambulating, ok to D/C per RN) General Behavior During Session: [facility] for tasks performed Cognition: [facility] for tasks performed  Page, Meribeth Mattes 07/10/2011, 10:37 AM

## 2011-07-10 NOTE — Progress Notes (Signed)
07/11/2011 [name] BSN CCM [phone] CM spoke with patient and spouse. Current plans are for patient to return to his home in [facility]. States he has no plans for snf. Pt already has cane and commode seat. He might need RW but is awiting confirmation from Physical therapist. Pt does want [facility] if HH orders. CM will follow

## 2011-07-10 NOTE — Op Note (Signed)
[name], [name]                  ACCOUNT NO.:  [id number]  MEDICAL RECORD NO.:  [id number]  LOCATION:  1620                         FACILITY:  [facility]  PHYSICIAN:  Jene Every, M.D.    DATE OF BIRTH:  September 15, 1935  DATE OF PROCEDURE:  07/09/2011 DATE OF DISCHARGE:                              OPERATIVE REPORT   PREOPERATIVE DIAGNOSIS:  Stenosis, recurrent disk herniation at L5-S1 on the left.  POSTOPERATIVE DIAGNOSIS:  Stenosis, recurrent disk herniation at L5-S1 on the left, stenosis at 4-5, dural rent.  PROCEDURES PERFORMED: 1. Redo decompression at L5-S1 on the left with foraminotomies of L5     and S1. 2. Hemilaminectomy of L5, decompression 4-5. 3. Repair of dural rent.  ANESTHESIA:  General.  ASSISTANT:  Georges Lynch. Gioffre, MD  BRIEF HISTORY:  This is a 75 year old with severe L5 radicular pain secondary to progressive disk collapse, L5 stenosis multifactorial, superior articulating process of S1 combining with ligamentum flavum, neurostenosis, and the disk protrusion compressed the 5 root.  He had myotomal weakness, dermatomal dysesthesias despite rest, activity modification, selective nerve root block.  He was indicated for decompression by hemilaminectomy, foraminotomy, possible decompression at 4-5.  Risks and benefits discussed including bleeding, infection, damage to neurovascular structures, no change in symptoms, worsening symptoms, or CSF leakage, DVT, PE, anesthetic complications, etc.  TECHNIQUE:  With the patient in supine position, after induction of adequate anesthesia, 1 g Kefzol, 600 mg clindamycin due to the status of his MRSA was not available.  He was placed prone on the [place] frame. All bony prominences were well padded.  Lumbar region was prepped and draped in the usual sterile fashion.  Two 18 gauge spinal needles were utilized to localize the L5-S1 interspace, we then confirmed that, made an incision in the above 5 spinous process to below S1  and the previous surgical incision extended, subcutaneous tissue was dissected.  Cautery utilized to achieve hemostasis.  Dorsolumbar fascia identified by line of skin incision.  Paraspinous muscle elevated from lamina 5 and of S1, operating microscope draped while in the surgical field.  Meticulously skeletonized the remaining lamina of L5 and S1 with a straight and a microcurette.  There were significant adhesions noted at L5-S1. Meticulously mobilized the epidural fibrosis along the facet, the inferior process of 5.  Then, gently placed a Woodson retractor probing beneath the lamina of 5 out laterally into the foramen of S1, performed a foraminotomy of S1, mobilized the S1 nerve root.  Placing neuropatties beneath the lamina of 5 and of S1 and then performed a hemilaminectomy of 5, protecting the neural elements at all times.  When placing the neuropatties beneath the L5 lamina, hemilaminectomy was performed and removed ligamentum flavum from previous site, we identified the 5 root and tracked it from proximal to distal.  Severe stenosis multifactorial due to a facet hypertrophy and a large osteophyte off the superior process of S1, ligamentum flavum infolding and epidural fibrosis.  As we meticulously decompressed this region and performed a partial medial hemi-facetectomy, the superior articulating process was noted, once removed, to have a rent just to the shoulder of the S1 nerve root, approximately 1 cm in  length.  The dura was fairly attenuated where he did have injections and epidurals, etc.  We covered this area and proceeded to enlarge the laminotomy on the left.  We removed the portion of the spinous process of 5, ligamentum flavum.  We performed foraminotomy of 5, removing synovial cystic material from the facet joints and then performed a full foraminotomy of 5.  There was a hardened disk at 5-1 here below, therefore no soft tissue material to remove.  We decompressed  lateral recess to the medial border of the pedicle and felt there was an excellent decompression of the 5 and of the S1 nerve roots.  Bipolar cautery utilized to achieve hemostasis as was bone wax and thrombin-soaked Gelfoam.  Attention was then turned towards the durotomy.  I felt best that it was oversewn and therefore sutured it with 6-0 Prolene, three sutures using a micro repair set under operating microscope, dural forceps, and titanium nerve hook.  We lifted slightly up on the end of the dura, placing our stitch, ensuring there was no incarceration of the underlying roots.  Three stitches were applied upon that, an excellent reinforcement was appreciated with Valsalva, the patient without evidence of CSF leakage.  I did place over that a DuraGen-type dural patch just to cover the entire area and then thrombin-soaked Gelfoam in the lateral recess after copious irrigation. Again, Valsalva indicated no evidence of CSF leakage.  Again, severe stenosis was noted at 5-1 and removed McCullough retractors, paraspinous muscles inspected, no evidence of active bleeding or CSF leakage. Repaired the dorsolumbar fascia with #1 Vicryl interrupted figure-of-8 sutures as well as a running suture for watertight closure, 2-0 Vicryl simple sutures in subcutaneous tissue.  Skin was reapproximated with staples.  Wound was dressed sterilely, placed supine on the hospital bed, extubated without difficulty, and transported to Recovery in satisfactory condition.  The patient tolerated the procedure well with no complications. Assistant, Ranee Gosselin, who helped with general intermittent neural traction, exposure, and repair of the dural rent.  Minimal blood loss. Again, we felt the intra-articular pathology was the hypertrophic facet, the superior articulating process attenuated in the shoulder of the S1 nerve root.     Jene Every, M.D.     Cordelia Pen  D:  07/09/2011  T:  07/10/2011  Job:   [id number]

## 2011-07-10 NOTE — Progress Notes (Signed)
Occupational Therapy Evaluation Patient Details Name: ADRIEL KESSEN MRN: [id number] DOB: 06-Mar-1936 Today's Date: 07/10/2011 [id number]  Problem List:  Patient Active Problem List  Diagnoses  . BLADDER CANCER  . HYPERLIPIDEMIA  . HYPERTENSION  . BRADYCARDIA  . ALLERGIC RHINITIS  . GERD  . HIATAL HERNIA WITH REFLUX  . DIVERTICULOSIS OF COLON  . RECTAL BLEEDING  . FATTY LIVER DISEASE  . RENAL INSUFFICIENCY  . Rheumatoid arthritis  . DISC DISEASE, LUMBAR  . BUNION, RIGHT FOOT  . DIZZINESS  . CHEST PAIN  . Diarrhea  . COLONIC POLYPS, HX OF  . ADENOIDECTOMY, HX OF  . DEPRESSION  . RENAL ARTERY STENOSIS  . FATIGUE  . Preventative health care    Past Medical History:  Past Medical History  Diagnosis Date  . Other chronic nonalcoholic liver disease   . Diverticulosis of colon (without mention of hemorrhage)   . Gastritis   . Esophagitis   . Abdominal tenderness, epigastric   . Bradycardia   . Chest pain   . Dizziness   . Hepatotoxicity     drug induced  . Bladder cancer   . Cough   . Colonic polyp   . Allergic rhinitis   . Renal insufficiency   . CAD (coronary artery disease)   . CHF (congestive heart failure)   . Lumbar disc disease   . PUD (peptic ulcer disease)   . Hiatal hernia     with reflux  . Rheumatoid arthritis   . HTN (hypertension)   . HLD (hyperlipidemia)   . GERD (gastroesophageal reflux disease)   . Depression   . S/P angioplasty   . Tremors of nervous system    Past Surgical History:  Past Surgical History  Procedure Date  . Adenoidectomy   . Coronary angioplasty     percutaneious transluminal  . Tonsillectomy   . Bladder surgery   . Lumbar disc surgery 1/08  . Transurethral resection of bladder tumor     OT Assessment/Plan/Recommendation OT Assessment Clinical Impression Statement: this 75 y.o. male was admitted for decompression and has had previous back sx.  Pt/wife verbalize understanding of all precautions with adls and pt  demonstrates good techniques.  No further OT needs nor DME needs. OT Goals    OT Evaluation Precautions/Restrictions  Precautions Precautions: Back Precaution Booklet Issued: Yes (comment) Precaution Comments: Provided pt with handout on proper positioning, posture, body mechanics, etc.  Restrictions Weight Bearing Restrictions: No Prior Functioning Home Living Lives With: Spouse Receives Help From: Family Type of Home: House Home Layout: One level Home Access: Other (comment) (has 4" stoop to get in ) Bathroom Shower/Tub: Engineer, manufacturing systems: Standard Home Adaptive Equipment: Bedside commode/3-in-1;Shower chair with back Additional Comments: educated on toilet aid if needed:  pt needs assist with back peri area Prior Function Level of Independence: Independent with basic ADLs Driving: Yes Vocation: Retired ADL ADL Grooming: Simulated;Set up Where Assessed - Grooming: Sitting, chair;Supported Location manager Bathing: Performed;Set up Where Assessed - Upper Body Bathing: Sitting, chair;Supported Lower Body Bathing: Performed;Moderate assistance Where Assessed - Lower Body Bathing: Sit to stand from chair Upper Body Dressing: Performed;Set up Where Assessed - Upper Body Dressing: Sitting, chair;Supported Lower Body Dressing: Maximal assistance;Performed Where Assessed - Lower Body Dressing: Sit to stand from chair Toilet Transfer: Performed;Supervision/safety Toilet Transfer Method: Proofreader: Raised toilet seat with arms (or 3-in-1 over toilet) Toileting - Clothing Manipulation: Simulated;Minimal assistance Where Assessed - Glass blower/designer Manipulation: Standing Toileting - Hygiene:  Simulated;Minimal assistance Where Assessed - Toileting Hygiene: Standing Tub/Shower Transfer: Performed;Supervision/safety Tub/Shower Transfer Method: Ambulating Equipment Used: Rolling walker ADL Comments: wife present and will assist with adls.  Pt  had back sx in '07.  Recalls precautions Vision/Perception  Vision - History Baseline Vision: Wears glasses all the time Patient Visual Report: No change from baseline Cognition Cognition Arousal/Alertness: Awake/alert Overall Cognitive Status: Appears within functional limits for tasks assessed Orientation Level: Oriented X4 Sensation/Coordination Sensation Light Touch: Appears Intact Proprioception: Appears Intact Coordination Gross Motor Movements are Fluid and Coordinated: Yes Extremity Assessment RUE Assessment RUE Assessment: Within Functional Limits bilaterally to 90 degrees; back precautions LUE Assessment LUE Assessment: Within Functional Limits Mobility  Bed Mobility Bed Mobility: Yes Sit to Supine to right:  Min A; no cues needed but min a for LEs up on bed Sit to Stand: 5: Supervision Stand to Sit: 5: Supervision Exercises   End of Session OT - End of Session Activity Tolerance: Patient tolerated treatment well Patient left: in bed;with family/visitor present General Behavior During Session: [facility] for tasks performed Cognition: [facility] for tasks performed   Alicja Everitt [phone] 07/10/2011, 11:15 AM

## 2011-07-10 NOTE — Progress Notes (Signed)
Subjective: Intermittent HA. No change with position. Relieved with tylenol. No CP, SOB   Objective: Vital signs in last 24 hours: Temp:  [96.8 F (36 C)-100.1 F (37.8 C)] 99.5 F (37.5 C) (12/27 0610) Pulse Rate:  [50-85] 67  (12/27 0445) Resp:  [12-20] 18  (12/27 0445) BP: (102-177)/(56-91) 114/61 mmHg (12/27 0445) SpO2:  [95 %-100 %] 96 % (12/27 0445) Weight:  [86.183 kg (190 lb)] 190 lb (86.183 kg) (12/26 1240)  Intake/Output from previous day: 12/26 0701 - 12/27 0700 In: 2455 [P.O.:240; I.V.:2110; IV Piggyback:105] Out: 3485 [Urine:3475; Blood:10] Intake/Output this shift: Total I/O In: -  Out: 2175 [Urine:2175]   Basename 07/10/11 0400  HGB 13.8    Basename 07/10/11 0400  WBC 8.8  RBC 4.54  HCT 40.8  PLT 196    Basename 07/10/11 0400  NA 136  K 4.2  CL 102  CO2 30  BUN 7  CREATININE 0.94  GLUCOSE 107*  CALCIUM 8.9   No results found for this basename: LABPT:2,INR:2 in the last 72 hours  Neurologically intact ABD soft Neurovascular intact Sensation intact distally Incision: dressing C/D/I Compartment soft  Assessment/Plan: POD #1 decompression. Dural repair. No spinal HA. Plan up today if no HA.  D/c/ foley. Plan d/c tomorrow.   [name] C 07/10/2011, 6:52 AM

## 2011-07-10 NOTE — Progress Notes (Addendum)
Physical Therapy Treatment Patient Details Name: [name] MRN: [id number] DOB: Aug 09, 1935 Today's Date: 07/10/2011  [id number]  PT Assessment/Plan  PT - Assessment/Plan Comments on Treatment Session: Pt tolerated ambulation and stair training well.  Recommend RW use for home to increase safety.  No follow up PT required at D/C PT Plan: Discharge plan remains appropriate PT Frequency: 5X/week Follow Up Recommendations: None Equipment Recommended: Rolling walker with 5" wheels PT Goals  Acute Rehab PT Goals PT Goal Formulation: With patient Time For Goal Achievement: 5 days Pt will go Supine/Side to Sit: with modified independence PT Goal: Supine/Side to Sit - Progress: Progressing toward goal Pt will go Sit to Supine/Side: with modified independence PT Goal: Sit to Supine/Side - Progress: Progressing toward goal Pt will go Sit to Stand: with modified independence PT Goal: Sit to Stand - Progress: Progressing toward goal Pt will go Stand to Sit: with modified independence PT Goal: Stand to Sit - Progress: Progressing toward goal Pt will Ambulate: >150 feet;with modified independence;with least restrictive assistive device PT Goal: Ambulate - Progress: Progressing toward goal Pt will Go Up / Down Stairs: 1-2 stairs;with least restrictive assistive device;with supervision PT Goal: Up/Down Stairs - Progress: Progressing toward goal  PT Treatment Precautions/Restrictions  Precautions Precautions: Back Precaution Booklet Issued: Yes (comment) Precaution Comments: Provided pt with handout on proper positioning, posture, body mechanics, etc.  Restrictions Weight Bearing Restrictions: No Mobility (including Balance) Bed Mobility Bed Mobility: Yes Rolling Right: 5: Supervision Right Sidelying to Sit: 5: Supervision Right Sidelying to Sit Details (indicate cue type and reason): Required cues for use of UE on bed not rails Sitting - Scoot to Edge of Bed: 5: Supervision Sit to  Supine - Left: 5: Supervision Sit to Supine - Left Details (indicate cue type and reason): required cues for UE use when returning to right side/supine Transfers Transfers: Yes Sit to Stand: 5: Supervision Sit to Stand Details (indicate cue type and reason): Required cues for hand placement Stand to Sit: 5: Supervision Stand to Sit Details: Cues given for controlled descent and activating core muscles to control pain Ambulation/Gait Ambulation/Gait: Yes Ambulation/Gait Assistance: 5: Supervision Ambulation/Gait Assistance Details (indicate cue type and reason): Ambulated w/ [facility] for 40' x 1.  Noted that pt was more stable with RW, switched to RW and ambulated another 300' Ambulation Distance (Feet): 40 Feet (then 300') Assistive device: Rolling walker Gait Pattern: Step-through pattern;Decreased step length - right;Decreased step length - left Gait velocity: WFL however with decreased step length B Stairs: Yes Stairs Assistance: 4: Min assist Stairs Assistance Details (indicate cue type and reason): Required cues for proper technique/sequencing with RW and foot placement.  Stair Management Technique: No rails;Forwards;With walker Number of Stairs: 1  Height of Stairs: 6  Wheelchair Mobility Wheelchair Mobility: No    Exercise  Other Exercises Other Exercises: Standing marching x 10 reps B LE.  Noted increased pain, therefore allowed pt to rest End of Session PT - End of Session Activity Tolerance: Patient tolerated treatment well Patient left: in bed;with call bell in reach;with family/visitor present Nurse Communication: Other (comment) (RN notified of O2 sats while ambulating, ok to D/C per RN) General Behavior During Session: [facility] for tasks performed Cognition: [facility] for tasks performed  Page, Meribeth Mattes 07/10/2011, 1:30 PM

## 2011-07-11 ENCOUNTER — Encounter (HOSPITAL_COMMUNITY): Payer: Self-pay | Admitting: Specialist

## 2011-07-11 NOTE — Discharge Summary (Signed)
Physician Discharge Summary  Patient ID: [name] MRN: [id number] DOB/AGE: 11/25/1935 75 y.o.  Admit date: 07/09/2011 Discharge date: 07/11/2011  Admission Diagnoses:  Discharge Diagnoses:  Active Problems:  * No active hospital problems. *    Discharged Condition: good  Hospital Course: uncomplicated  Consults: none  Significant Diagnostic Studies: labs:   Treatments: surgery: lumbar decompression. Repair dural rent.  Discharge Exam: Blood pressure 124/62, pulse 69, temperature 99.3 F (37.4 [name]), temperature source Oral, resp. rate 16, height 5\' 7"  (1.702 m), weight 86.183 kg (190 lb), SpO2 91.00%. General appearance: alert Neurologic: Alert and oriented X 3, normal strength and tone. Normal symmetric reflexes. Normal coordination and gait. NVI. No HA. No DVT.  Disposition:   Discharge Orders    Future Orders Please Complete By Expires   Walker standard        Medication List  As of 07/11/2011  6:47 AM   CONTINUE taking these medications         acetaminophen-codeine 300-30 MG per tablet   Commonly known as: TYLENOL #3      amLODipine 5 MG tablet   Commonly known as: NORVASC   Take 1 tablet (5 mg total) by mouth every morning.      cetirizine 10 MG tablet   Commonly known as: ZYRTEC      * citalopram 10 MG tablet   Commonly known as: CELEXA   Take 1 tablet (10 mg total) by mouth every morning.      * citalopram 10 MG tablet   Commonly known as: CELEXA   Take 1 tablet (10 mg total) by mouth every morning.      clobetasol ointment 0.05 %   Commonly known as: TEMOVATE      fluticasone 50 MCG/ACT nasal spray   Commonly known as: FLONASE      gabapentin 300 MG capsule   Commonly known as: NEURONTIN      hydrocortisone 25 MG suppository   Commonly known as: ANUSOL-HC      lisinopril 40 MG tablet   Commonly known as: PRINIVIL,ZESTRIL   Take 0.5 tablets (20 mg total) by mouth at bedtime.      methylcellulose 1 % ophthalmic solution   Commonly  known as: ARTIFICIAL TEARS      nitroGLYCERIN 0.4 MG SL tablet   Commonly known as: NITROSTAT      omeprazole 20 MG capsule   Commonly known as: PRILOSEC   Take 2 capsules (40 mg total) by mouth daily.      * primidone 250 MG tablet   Commonly known as: MYSOLINE   Take 0.5 tablets (125 mg total) by mouth 3 (three) times daily.      * primidone 250 MG tablet   Commonly known as: MYSOLINE   Take 0.5 tablets (125 mg total) by mouth 3 (three) times daily.      ranitidine 300 MG capsule   Commonly known as: ZANTAC      simvastatin 40 MG tablet   Commonly known as: ZOCOR   Take 1.5 tablets (60 mg total) by mouth at bedtime.      Tamsulosin HCl 0.4 MG Caps   Commonly known as: FLOMAX      travoprost (benzalkonium) 0.004 % ophthalmic solution   Commonly known as: TRAVATAN      triamcinolone cream 0.1 %   Commonly known as: KENALOG      vitamin [name] 500 MG tablet   Commonly known as: ASCORBIC ACID     *  Notice: This list has 4 medication(s) that are the same as other medications prescribed for you. Read the directions carefully, and ask your doctor or other care provider to review them with you.         Follow-up Information    Follow up with [name] in 10 days.   Contact information:   [facility] [address], [address] [place] [address] [phone]          Signed: Javier [name] 07/11/2011, 6:47 AM

## 2011-07-11 NOTE — Progress Notes (Signed)
Subjective: 2 Days Post-Op Procedure(s) (LRB): LUMBAR LAMINECTOMY/DECOMPRESSION MICRODISCECTOMY (Left) Patient reports pain as 3 on 0-10 scale.    Objective: Vital signs in last 24 hours: Temp:  [98.3 F (36.8 C)-99.3 F (37.4 C)] 99.3 F (37.4 C) (12/28 0534) Pulse Rate:  [60-70] 69  (12/28 0534) Resp:  [16-18] 16  (12/28 0534) BP: (105-124)/(56-63) 124/62 mmHg (12/28 0534) SpO2:  [91 %-96 %] 91 % (12/28 0534)  Intake/Output from previous day: 12/27 0701 - 12/28 0700 In: 3186.7 [P.O.:720; I.V.:2466.7] Out: 1826 [Urine:1825; Emesis/NG output:1] Intake/Output this shift: Total I/O In: 1200 [I.V.:1200] Out: 1201 [Urine:1200; Emesis/NG output:1]   Basename 07/10/11 0400  HGB 13.8    Basename 07/10/11 0400  WBC 8.8  RBC 4.54  HCT 40.8  PLT 196    Basename 07/10/11 0400  NA 136  K 4.2  CL 102  CO2 30  BUN 7  CREATININE 0.94  GLUCOSE 107*  CALCIUM 8.9   No results found for this basename: LABPT:2,INR:2 in the last 72 hours Neurologically intact ABD soft Sensation intact distally Intact pulses distally Compartment soft I/C/D/I Assessment/Plan: 2 Days Post-Op Procedure(s) (LRB): LUMBAR LAMINECTOMY/DECOMPRESSION MICRODISCECTOMY (Left) Discharge home with home health. Dc instructions given. Begin asa tomorrow. Walker. F/U 10d.  Chabeli Barsamian C 07/11/2011, 6:38 AM

## 2011-07-11 NOTE — Progress Notes (Signed)
Physical Therapy Treatment Patient Details Name: SHAWNEE HIGHAM MRN: [id number] DOB: October 15, 1935 Today's Date: 07/11/2011  [phone] G  PT Assessment/Plan  PT - Assessment/Plan Comments on Treatment Session: Pt ambulating well and requires no assist/cues.  Pt to D/C home today.  PT Plan: Discharge plan remains appropriate PT Frequency: Min 5X/week Follow Up Recommendations: None Equipment Recommended: Rolling walker with 5" wheels PT Goals  Acute Rehab PT Goals PT Goal Formulation: With patient Time For Goal Achievement: 5 days Pt will go Sit to Supine/Side: with modified independence PT Goal: Sit to Supine/Side - Progress: Met Pt will go Sit to Stand: with modified independence PT Goal: Sit to Stand - Progress: Met Pt will go Stand to Sit: with modified independence PT Goal: Stand to Sit - Progress: Met Pt will Ambulate: >150 feet;with modified independence;with least restrictive assistive device;1 - 15 feet PT Goal: Ambulate - Progress: Met  PT Treatment Precautions/Restrictions  Precautions Precautions: Back Precaution Booklet Issued: Yes (comment) Precaution Comments: Provided pt with handout on proper positioning, posture, body mechanics, etc.  Restrictions Weight Bearing Restrictions: No Mobility (including Balance) Bed Mobility Bed Mobility: Yes Sit to Supine - Left: 6: Modified independent (Device/Increase time) Sit to Supine - Left Details (indicate cue type and reason): requires increased time Transfers Transfers: Yes Stand to Sit: 6: Modified independent (Device/Increase time) Stand to Sit Details: requires increased time due to slight pain with sitting Ambulation/Gait Ambulation/Gait: Yes Ambulation/Gait Assistance: 6: Modified independent (Device/Increase time) Ambulation/Gait Assistance Details (indicate cue type and reason): Requires use of RW, no cues for posture/sequencing  Ambulation Distance (Feet): 300 Feet Assistive device: Rolling walker Gait  Pattern: Step-through pattern;Decreased step length - right;Decreased step length - left Gait velocity: WFL  Posture/Postural Control Posture/Postural Control: No significant limitations Exercise  Other Exercises Other Exercises: Posterior pelvic tilts x 15 reps Other Exercises: B UE hip to hip x 15 reps each Other Exercises: Supine knee flex with cues for core activation x 15 reps End of Session PT - End of Session Activity Tolerance: Patient tolerated treatment well Patient left: in bed;with call bell in reach General Behavior During Session: [facility] for tasks performed Cognition: [facility] for tasks performed  Page, Meribeth Mattes 07/11/2011, 9:37 AM

## 2011-07-11 NOTE — Progress Notes (Signed)
Pt for d/c home today. IV d/c'd & dressing applied. Denies pain to back. Ambulates to BR for toilet use. Dressing to back CDI. Dressing supplies for home use given at d/c. No changes in am assessments today. D/C instructions & Rx given to wife/pt with verbalized understanding. Wife & son at bedside to assist with d/c. Walker delivered for home use.

## 2011-08-18 ENCOUNTER — Telehealth: Payer: Self-pay

## 2011-08-18 NOTE — Telephone Encounter (Signed)
[facility] Triage Call Report Triage Record Num: [id number] Operator: Arline Asp Loftin Patient Name: [name] Call Date & Time: 08/16/2011 1:46:34PM Patient Phone: [phone] PCP: Oliver Barre Patient Gender: Male PCP Fax : [phone] Patient DOB: [birth year] Practice Name: [facility] Reason for Call: Caller: [place]/Patient; PCP: Oliver Barre; CB#: [phone]; Slight nasal congestion and runny nose onset 02/01, followed by dry cough onset 02/02, afebrile. Headache onset 02/01. Has used OTC cough drops with short term relief. Emergent sx negative. Home care advice per "Cough, Adult" due to "dry cough following a cold, all triage questions negative." Call back parameters reviewed. Protocol(s) Used: Cough - Adult Protocol(s) Used: Upper Respiratory Infection (URI) Recommended Outcome per Protocol: Provide Home/Self Care Reason for Outcome: Cough is the symptom causing most concern Dry cough following a cold Care Advice: ~ 08/16/2011 2:01:42PM Page 1 of 1 [email or web address]

## 2011-08-26 ENCOUNTER — Ambulatory Visit (INDEPENDENT_AMBULATORY_CARE_PROVIDER_SITE_OTHER): Payer: Medicare Other | Admitting: Internal Medicine

## 2011-08-26 ENCOUNTER — Ambulatory Visit (INDEPENDENT_AMBULATORY_CARE_PROVIDER_SITE_OTHER)
Admission: RE | Admit: 2011-08-26 | Discharge: 2011-08-26 | Disposition: A | Discharge status: Home / Self Care | Payer: Medicare Other | Source: Ambulatory Visit | Attending: Internal Medicine | Admitting: Internal Medicine

## 2011-08-26 ENCOUNTER — Encounter: Payer: Self-pay | Admitting: Internal Medicine

## 2011-08-26 VITALS — BP 120/70 | HR 72 | Temp 97.2°F | Ht 67.0 in | Wt 199.4 lb

## 2011-08-26 DIAGNOSIS — R509 Fever, unspecified: Secondary | ICD-10-CM | POA: Diagnosis not present

## 2011-08-26 DIAGNOSIS — F329 Major depressive disorder, single episode, unspecified: Secondary | ICD-10-CM | POA: Diagnosis not present

## 2011-08-26 DIAGNOSIS — J209 Acute bronchitis, unspecified: Secondary | ICD-10-CM

## 2011-08-26 DIAGNOSIS — I10 Essential (primary) hypertension: Secondary | ICD-10-CM

## 2011-08-26 DIAGNOSIS — R05 Cough: Secondary | ICD-10-CM | POA: Diagnosis not present

## 2011-08-26 MED ORDER — SIMVASTATIN 40 MG PO TABS: 60.0000 mg | ORAL_TABLET | Freq: Every day | ORAL | Status: DC

## 2011-08-26 MED ORDER — HYDROCHLOROTHIAZIDE 25 MG PO TABS: 12.5000 mg | ORAL_TABLET | Freq: Every day | ORAL | Status: DC

## 2011-08-26 MED ORDER — OMEPRAZOLE 20 MG PO CPDR: 40.0000 mg | DELAYED_RELEASE_CAPSULE | Freq: Every day | ORAL | Status: DC

## 2011-08-26 MED ORDER — GABAPENTIN 300 MG PO CAPS: ORAL_CAPSULE | ORAL | Status: DC

## 2011-08-26 MED ORDER — TRIAMCINOLONE ACETONIDE 0.1 % EX CREA: 1.0000 "application " | TOPICAL_CREAM | Freq: Two times a day (BID) | CUTANEOUS | Status: DC | PRN

## 2011-08-26 MED ORDER — AZITHROMYCIN 250 MG PO TABS: ORAL_TABLET | ORAL | Status: AC

## 2011-08-26 MED ORDER — RANITIDINE HCL 300 MG PO CAPS: 300.0000 mg | ORAL_CAPSULE | Freq: Every evening | ORAL | Status: DC

## 2011-08-26 MED ORDER — CLOBETASOL PROPIONATE 0.05 % EX OINT: 1.0000 "application " | TOPICAL_OINTMENT | Freq: Every day | CUTANEOUS | Status: DC | PRN

## 2011-08-26 MED ORDER — TAMSULOSIN HCL 0.4 MG PO CAPS: 0.4000 mg | ORAL_CAPSULE | Freq: Every day | ORAL | Status: DC

## 2011-08-26 MED ORDER — FLUTICASONE PROPIONATE 50 MCG/ACT NA SUSP: 2.0000 | Freq: Every day | NASAL | Status: DC | PRN

## 2011-08-26 MED ORDER — CITALOPRAM HYDROBROMIDE 10 MG PO TABS: 10.0000 mg | ORAL_TABLET | ORAL | Status: DC

## 2011-08-26 MED ORDER — LISINOPRIL 40 MG PO TABS: 20.0000 mg | ORAL_TABLET | Freq: Every day | ORAL | Status: DC

## 2011-08-26 MED ORDER — NITROGLYCERIN 0.4 MG SL SUBL: 0.4000 mg | SUBLINGUAL_TABLET | SUBLINGUAL | Status: DC | PRN

## 2011-08-26 MED ORDER — AMLODIPINE BESYLATE 5 MG PO TABS: 5.0000 mg | ORAL_TABLET | ORAL | Status: DC

## 2011-08-26 MED ORDER — TRAVOPROST 0.004 % OP SOLN: 1.0000 [drp] | Freq: Every day | OPHTHALMIC | Status: DC

## 2011-08-26 MED ORDER — HYDROCODONE-HOMATROPINE 5-1.5 MG/5ML PO SYRP: 5.0000 mL | ORAL_SOLUTION | Freq: Four times a day (QID) | ORAL | Status: AC | PRN

## 2011-08-26 NOTE — Assessment & Plan Note (Signed)
Mild to mod, for antibx course,  to f/u any worsening symptoms or concerns, cant ro pna - for cxr 

## 2011-08-26 NOTE — Assessment & Plan Note (Signed)
stable overall by hx and exam, most recent data reviewed with pt, and pt to continue medical treatment as before Lab Results  Component Value Date   WBC 8.8 07/10/2011   HGB 13.8 07/10/2011   HCT 40.8 07/10/2011   PLT 196 07/10/2011   GLUCOSE 107* 07/10/2011   CHOL 138 09/12/2010   TRIG 120.0 09/12/2010   HDL 42.50 09/12/2010   LDLCALC 72 09/12/2010   ALT 27 07/03/2011   AST 24 07/03/2011   NA 136 07/10/2011   K 4.2 07/10/2011   CL 102 07/10/2011   CREATININE 0.94 07/10/2011   BUN 7 07/10/2011   CO2 30 07/10/2011   TSH 1.09 09/12/2010   PSA 0.21 02/07/2008   INR 1.02 07/03/2011    

## 2011-08-26 NOTE — Patient Instructions (Addendum)
Take all new medications as prescribed - the antibiotic and the cough medicine (to [facility]) Continue all other medications as before; your refills were sent to [facility] as requested You are given the wriitten rx for the HCTZ fluid pill, and the NTG is sent to [facility] Please go to XRAY in the Basement for the x-ray test Please call the phone number [phone] (the [employer] System) for results of testing in 2-3 days;  When calling, simply dial the number, and when prompted enter the MRN number above (the Medical Record Number) and the # key, then the message should start.

## 2011-08-26 NOTE — Assessment & Plan Note (Signed)
stable overall by hx and exam, most recent data reviewed with pt, and pt to continue medical treatment as before BP Readings from Last 3 Encounters:  08/26/11 120/70  07/11/11 124/62  07/11/11 124/62

## 2011-08-26 NOTE — Progress Notes (Signed)
Subjective:    Patient ID: [name], male    DOB: September 21, 1935, 76 y.o.   MRN: [id number]  HPI  Here with acute onset mild to mod 2-3 days ST, HA, general weakness and malaise, with prod cough greenish sputum, but Pt denies chest pain, increased sob or doe, wheezing, orthopnea, PND, increased LE swelling, palpitations, dizziness or syncope.  Also s/p lumbar surgury Jul 09 2011 with Dr Shelle Iron, overall doing well.  Pt denies new neurological symptoms such as new headache, or facial or extremity weakness or numbness   Pt denies polydipsia, polyuria, .  Pt states overall good compliance with meds, trying to follow lower cholesterol, diabetic diet, wt overall stable but little exercise however.   Overall good compliance with treatment, and good medicine tolerability. Denies worsening depressive symptoms, suicidal ideation, or panic, though has ongoing anxiety, not increased recently. Needs mult med refills Past Medical History  Diagnosis Date  . Other chronic nonalcoholic liver disease   . Diverticulosis of colon (without mention of hemorrhage)   . Gastritis   . Esophagitis   . Abdominal tenderness, epigastric   . Bradycardia   . Chest pain   . Dizziness   . Hepatotoxicity     drug induced  . Bladder cancer   . Cough   . Colonic polyp   . Allergic rhinitis   . Renal insufficiency   . CAD (coronary artery disease)   . CHF (congestive heart failure)   . Lumbar disc disease   . PUD (peptic ulcer disease)   . Hiatal hernia     with reflux  . Rheumatoid arthritis   . HTN (hypertension)   . HLD (hyperlipidemia)   . GERD (gastroesophageal reflux disease)   . Depression   . S/P angioplasty   . Tremors of nervous system    Past Surgical History  Procedure Date  . Adenoidectomy   . Coronary angioplasty     percutaneious transluminal  . Tonsillectomy   . Bladder surgery   . Lumbar disc surgery 1/08  . Transurethral resection of bladder tumor   . Lumbar laminectomy/decompression  microdiscectomy 07/09/2011    Procedure: LUMBAR LAMINECTOMY/DECOMPRESSION MICRODISCECTOMY;  Surgeon: Javier Docker;  Location: [facility];  Service: Orthopedics;  Laterality: Left;  Decompression L5 - S1 on the Left and repair of dura    reports that he quit smoking about 25 years ago. His smoking use included Cigarettes. He has a 22.5 pack-year smoking history. He has never used smokeless tobacco. He reports that he does not drink alcohol or use illicit drugs. family history includes Cancer in his brother, father, and sister; Diabetes in his sister; and Heart attack in his mother. Allergies  Allergen Reactions  . Hydrochlorothiazide     REACTION: hyponatremia with daily use  . Sulfonamide Derivatives     REACTION: rash   Current Outpatient Prescriptions on File Prior to Visit  Medication Sig Dispense Refill  . acetaminophen-codeine (TYLENOL #3) 300-30 MG per tablet Take 1 tablet by mouth 2 (two) times daily as needed. Pain       . Ascorbic Acid (VITAMIN C) 500 MG tablet Take 500 mg by mouth 2 (two) times daily.       . cetirizine (ZYRTEC) 10 MG tablet Take 10 mg by mouth daily.        . hydrocortisone (ANUSOL-HC) 25 MG suppository Place 25 mg rectally 2 (two) times daily as needed. Itching       . methylcellulose (ARTIFICIAL TEARS) 1 % ophthalmic  solution Place 1 drop into both eyes 2 (two) times daily as needed. Dry eyes        . primidone (MYSOLINE) 250 MG tablet Take 0.5 tablets (125 mg total) by mouth 3 (three) times daily.  270 tablet  1   Review of Systems Review of Systems  Constitutional: Negative for diaphoresis and unexpected weight change.  HENT: Negative for drooling and tinnitus.   Eyes: Negative for photophobia and visual disturbance.  Respiratory: Negative for choking and stridor.   Gastrointestinal: Negative for vomiting and blood in stool.  Genitourinary: Negative for hematuria and decreased urine volume.     Objective:   Physical Exam BP 120/70  Pulse 72   Temp(Src) 97.2 F (36.2 C) (Oral)  Ht 5\' 7"  (1.702 m)  Wt 199 lb 6 oz (90.436 kg)  BMI 31.23 kg/m2  SpO2 95% Physical Exam  VS noted, mild ill Constitutional: Pt appears well-developed and well-nourished.  HENT: Head: Normocephalic.  Right Ear: External ear normal.  Left Ear: External ear normal.  Bilat tm's mild erythema.  Sinus tender.  Pharynx mild erythema Eyes: Conjunctivae and EOM are normal. Pupils are equal, round, and reactive to light.  Neck: Normal range of motion. Neck supple.  Cardiovascular: Normal rate and regular rhythm.   Pulmonary/Chest: Effort normal and breath sounds normal.  Neurological: Pt is alert. No cranial nerve deficit.  Skin: Skin is warm. No erythema.  Psychiatric: Pt behavior is normal. Thought content normal. 1+ nervous, not depressed appearing    Assessment & Plan:

## 2011-09-23 ENCOUNTER — Other Ambulatory Visit: Payer: Self-pay

## 2011-09-23 MED ORDER — GABAPENTIN 300 MG PO CAPS: ORAL_CAPSULE | ORAL | Status: DC

## 2011-10-02 DIAGNOSIS — H409 Unspecified glaucoma: Secondary | ICD-10-CM | POA: Diagnosis not present

## 2011-10-02 DIAGNOSIS — H25019 Cortical age-related cataract, unspecified eye: Secondary | ICD-10-CM | POA: Diagnosis not present

## 2011-10-02 DIAGNOSIS — H401290 Low-tension glaucoma, unspecified eye, stage unspecified: Secondary | ICD-10-CM | POA: Diagnosis not present

## 2011-10-03 ENCOUNTER — Telehealth: Payer: Self-pay

## 2011-10-03 NOTE — Telephone Encounter (Signed)
Called [facility] to follow up on prescriptions pharmacy stated had no refills per patient request. The pharmacy informed they sent out Tamsulosin and Omeprazole on 09/25/11, and they did not refill Gabapentin when he requested as he had requested too early. Called and informed the patient if information.

## 2011-12-02 ENCOUNTER — Other Ambulatory Visit: Payer: Self-pay | Admitting: Internal Medicine

## 2012-01-17 ENCOUNTER — Other Ambulatory Visit: Payer: Self-pay | Admitting: Internal Medicine

## 2012-03-01 ENCOUNTER — Telehealth: Payer: Self-pay

## 2012-03-01 NOTE — Telephone Encounter (Signed)
Received [place] Approval for Simvastatin from [facility] approval good through 03/01/2013.  ID [id number]. Sent approval letter to scan.

## 2012-03-04 DIAGNOSIS — H409 Unspecified glaucoma: Secondary | ICD-10-CM | POA: Diagnosis not present

## 2012-03-04 DIAGNOSIS — H401290 Low-tension glaucoma, unspecified eye, stage unspecified: Secondary | ICD-10-CM | POA: Diagnosis not present

## 2012-03-12 ENCOUNTER — Encounter: Payer: Self-pay | Admitting: Internal Medicine

## 2012-03-12 ENCOUNTER — Ambulatory Visit (INDEPENDENT_AMBULATORY_CARE_PROVIDER_SITE_OTHER): Payer: Medicare Other | Admitting: Internal Medicine

## 2012-03-12 VITALS — BP 110/68 | HR 56 | Temp 97.2°F | Ht 67.0 in | Wt 193.1 lb

## 2012-03-12 DIAGNOSIS — J029 Acute pharyngitis, unspecified: Secondary | ICD-10-CM

## 2012-03-12 DIAGNOSIS — F329 Major depressive disorder, single episode, unspecified: Secondary | ICD-10-CM

## 2012-03-12 DIAGNOSIS — I10 Essential (primary) hypertension: Secondary | ICD-10-CM

## 2012-03-12 MED ORDER — AZITHROMYCIN 250 MG PO TABS: ORAL_TABLET | ORAL | Status: AC

## 2012-03-12 MED ORDER — HYDROCODONE-HOMATROPINE 5-1.5 MG/5ML PO SYRP: 5.0000 mL | ORAL_SOLUTION | Freq: Four times a day (QID) | ORAL | Status: AC | PRN

## 2012-03-12 NOTE — Patient Instructions (Addendum)
Take all new medications as prescribed Continue all other medications as before Please return in 6 months or sooner if needed  

## 2012-03-13 ENCOUNTER — Encounter: Payer: Self-pay | Admitting: Internal Medicine

## 2012-03-13 NOTE — Assessment & Plan Note (Signed)
stable overall by hx and exam, most recent data reviewed with pt, and pt to continue medical treatment as before BP Readings from Last 3 Encounters:  03/12/12 110/68  08/26/11 120/70  07/11/11 124/62

## 2012-03-13 NOTE — Progress Notes (Signed)
Subjective:    Patient ID: [name], male    DOB: 12-Aug-1935, 76 y.o.   MRN: [id number]  HPI   Here with 3 days acute onset fever, facial pain, pressure, general weakness and malaise, and severe ST, with moderate nonprod cough and Pt denies chest pain, increased sob or doe, wheezing, orthopnea, PND, increased LE swelling, palpitations, dizziness or syncope.  Pt denies new neurological symptoms such as new headache, or facial or extremity weakness or numbness   Pt denies polydipsia, polyuria,  Denies worsening depressive symptoms, suicidal ideation, or panic, though has ongoing anxiety, not increased recently.  Past Medical History  Diagnosis Date  . Other chronic nonalcoholic liver disease   . Diverticulosis of colon (without mention of hemorrhage)   . Gastritis   . Esophagitis   . Abdominal tenderness, epigastric   . Bradycardia   . Chest pain   . Dizziness   . Hepatotoxicity     drug induced  . Bladder cancer   . Cough   . Colonic polyp   . Allergic rhinitis   . Renal insufficiency   . CAD (coronary artery disease)   . CHF (congestive heart failure)   . Lumbar disc disease   . PUD (peptic ulcer disease)   . Hiatal hernia     with reflux  . Rheumatoid arthritis   . HTN (hypertension)   . HLD (hyperlipidemia)   . GERD (gastroesophageal reflux disease)   . Depression   . S/P angioplasty   . Tremors of nervous system    Past Surgical History  Procedure Date  . Adenoidectomy   . Coronary angioplasty     percutaneious transluminal  . Tonsillectomy   . Bladder surgery   . Lumbar disc surgery 1/08  . Transurethral resection of bladder tumor   . Lumbar laminectomy/decompression microdiscectomy 07/09/2011    Procedure: LUMBAR LAMINECTOMY/DECOMPRESSION MICRODISCECTOMY;  Surgeon: Javier Docker;  Location: [facility];  Service: Orthopedics;  Laterality: Left;  Decompression L5 - S1 on the Left and repair of dura    reports that he quit smoking about 26 years ago. His smoking use  included Cigarettes. He has a 22.5 pack-year smoking history. He has never used smokeless tobacco. He reports that he does not drink alcohol or use illicit drugs. family history includes Cancer in his brother, father, and sister; Diabetes in his sister; and Heart attack in his mother. Allergies  Allergen Reactions  . Hydrochlorothiazide     REACTION: hyponatremia with daily use  . Sulfonamide Derivatives     REACTION: rash   Current Outpatient Prescriptions on File Prior to Visit  Medication Sig Dispense Refill  . acetaminophen-codeine (TYLENOL #3) 300-30 MG per tablet Take 1 tablet by mouth 2 (two) times daily as needed. Pain       . amLODipine (NORVASC) 5 MG tablet Take 1 tablet (5 mg total) by mouth every morning.  90 tablet  3  . Ascorbic Acid (VITAMIN C) 500 MG tablet Take 500 mg by mouth 2 (two) times daily.       . cetirizine (ZYRTEC) 10 MG tablet Take 10 mg by mouth daily.        . citalopram (CELEXA) 10 MG tablet Take 1 tablet (10 mg total) by mouth every morning.  90 tablet  3  . clobetasol ointment (TEMOVATE) 0.05 % Apply 1 application topically daily as needed.  30 g  2  . fluticasone (FLONASE) 50 MCG/ACT nasal spray Place 2 sprays into the nose daily as  needed. Allergies  48 g  3  . gabapentin (NEURONTIN) 300 MG capsule TAKE 1 CAPSULE THREE TIMES DAILY  AND TAKE 2 CAPSULES AT BEDTIME  450 capsule  PRN  . hydrochlorothiazide (HYDRODIURIL) 25 MG tablet Take 0.5 tablets (12.5 mg total) by mouth daily. As needed for swelling  100 tablet  3  . hydrocortisone (ANUSOL-HC) 25 MG suppository Place 25 mg rectally 2 (two) times daily as needed. Itching       . lisinopril (PRINIVIL,ZESTRIL) 40 MG tablet Take 0.5 tablets (20 mg total) by mouth at bedtime.  45 tablet  3  . methylcellulose (ARTIFICIAL TEARS) 1 % ophthalmic solution Place 1 drop into both eyes 2 (two) times daily as needed. Dry eyes        . nitroGLYCERIN (NITROSTAT) 0.4 MG SL tablet Place 1 tablet (0.4 mg total) under the  tongue every 5 (five) minutes as needed. Chest pain  60 tablet  5  . omeprazole (PRILOSEC) 20 MG capsule Take 2 capsules (40 mg total) by mouth daily.  180 capsule  3  . primidone (MYSOLINE) 250 MG tablet Take 0.5 tablets (125 mg total) by mouth 3 (three) times daily.  270 tablet  1  . ranitidine (ZANTAC) 300 MG capsule Take 1 capsule (300 mg total) by mouth every evening.  90 capsule  3  . simvastatin (ZOCOR) 40 MG tablet Take 1.5 tablets (60 mg total) by mouth at bedtime.  135 tablet  3  . Tamsulosin HCl (FLOMAX) 0.4 MG CAPS Take 1 capsule (0.4 mg total) by mouth daily.  90 capsule  3  . travoprost, benzalkonium, (TRAVATAN) 0.004 % ophthalmic solution Place 1 drop into both eyes at bedtime.  2.5 mL  2  . triamcinolone cream (KENALOG) 0.1 % APPLY TO AFFECTED AREA TOPICALLY 2 (TWO) TIMES DAILY AS NEEDED. ITCHING/RASH  90 g  3   Review of Systems Constitutional: Negative for diaphoresis and unexpected weight change.  HENT: Negative for drooling and tinnitus.   Eyes: Negative for photophobia and visual disturbance.  Respiratory: Negative for choking and stridor.   Gastrointestinal: Negative for vomiting and blood in stool.  Genitourinary: Negative for hematuria and decreased urine volume.  Musculoskeletal: Negative for gait problem.  Skin: Negative for color change and wound.  Neurological: Negative for tremors and numbness.  Psychiatric/Behavioral: Negative for decreased concentration. The patient is not hyperactive.  '    Objective:   Physical Exam Physical Exam  VS noted,mild ill Constitutional: Pt appears well-developed and well-nourished.  HENT: Head: Normocephalic.  Right Ear: External ear normal.  Left Ear: External ear normal.  Bilat tm's mild erythema.  Sinus tender.  Pharynx severe erythema Eyes: Conjunctivae and EOM are normal. Pupils are equal, round, and reactive to light.  Neck: Normal range of motion. Neck supple.  Cardiovascular: Normal rate and regular rhythm.     Pulmonary/Chest: Effort normal and breath sounds normal.  Neurological: Pt is alert. Not confused Skin: Skin is warm. No erythema.  Psychiatric: Pt behavior is normal. Thought content normal. Not depressed affect    Assessment & Plan:

## 2012-03-13 NOTE — Assessment & Plan Note (Signed)
Mild to mod, for antibx course,  to f/u any worsening symptoms or concerns 

## 2012-03-13 NOTE — Assessment & Plan Note (Signed)
stable overall by hx and exam, most recent data reviewed with pt, and pt to continue medical treatment as before Lab Results  Component Value Date   WBC 8.8 07/10/2011   HGB 13.8 07/10/2011   HCT 40.8 07/10/2011   PLT 196 07/10/2011   GLUCOSE 107* 07/10/2011   CHOL 138 09/12/2010   TRIG 120.0 09/12/2010   HDL 42.50 09/12/2010   LDLCALC 72 09/12/2010   ALT 27 07/03/2011   AST 24 07/03/2011   NA 136 07/10/2011   K 4.2 07/10/2011   CL 102 07/10/2011   CREATININE 0.94 07/10/2011   BUN 7 07/10/2011   CO2 30 07/10/2011   TSH 1.09 09/12/2010   PSA 0.21 02/07/2008   INR 1.02 07/03/2011

## 2012-03-26 DIAGNOSIS — Z23 Encounter for immunization: Secondary | ICD-10-CM | POA: Diagnosis not present

## 2012-04-19 ENCOUNTER — Ambulatory Visit (INDEPENDENT_AMBULATORY_CARE_PROVIDER_SITE_OTHER): Payer: Medicare Other | Admitting: Internal Medicine

## 2012-04-19 ENCOUNTER — Encounter: Payer: Self-pay | Admitting: Internal Medicine

## 2012-04-19 VITALS — BP 122/72 | HR 56 | Temp 97.1°F | Ht 67.0 in | Wt 197.1 lb

## 2012-04-19 DIAGNOSIS — L0390 Cellulitis, unspecified: Secondary | ICD-10-CM | POA: Diagnosis not present

## 2012-04-19 DIAGNOSIS — K649 Unspecified hemorrhoids: Secondary | ICD-10-CM

## 2012-04-19 DIAGNOSIS — I10 Essential (primary) hypertension: Secondary | ICD-10-CM | POA: Diagnosis not present

## 2012-04-19 DIAGNOSIS — L0291 Cutaneous abscess, unspecified: Secondary | ICD-10-CM | POA: Diagnosis not present

## 2012-04-19 MED ORDER — DOXYCYCLINE HYCLATE 100 MG PO TABS: 100.0000 mg | ORAL_TABLET | Freq: Two times a day (BID) | ORAL | Status: DC

## 2012-04-19 MED ORDER — HYDROCORTISONE ACETATE 25 MG RE SUPP: 25.0000 mg | Freq: Two times a day (BID) | RECTAL | Status: DC | PRN

## 2012-04-19 NOTE — Patient Instructions (Addendum)
Take all new medications as prescribed - the antibiotic Continue all other medications as before - including the suppositories

## 2012-04-21 ENCOUNTER — Encounter: Payer: Self-pay | Admitting: Internal Medicine

## 2012-04-21 ENCOUNTER — Ambulatory Visit (INDEPENDENT_AMBULATORY_CARE_PROVIDER_SITE_OTHER): Payer: Medicare Other | Admitting: Internal Medicine

## 2012-04-21 VITALS — BP 122/68 | HR 54 | Ht 67.0 in | Wt 196.0 lb

## 2012-04-21 DIAGNOSIS — R55 Syncope and collapse: Secondary | ICD-10-CM | POA: Diagnosis not present

## 2012-04-21 DIAGNOSIS — I10 Essential (primary) hypertension: Secondary | ICD-10-CM

## 2012-04-21 NOTE — Assessment & Plan Note (Signed)
His blood pressure appears to be well-controlled. I've recommended that he allow his pressure to be a bit on the high side. If his blood pressure on any particular day he is running low, I've instructed the patient to not take his diuretic or his ACE inhibitor.

## 2012-04-21 NOTE — Patient Instructions (Addendum)
Your physician wants you to follow-up in: 18 months with Dr Court Joy will receive a reminder letter in the mail two months in advance. If you don't receive a letter, please call our office to schedule the follow-up appointment.

## 2012-04-21 NOTE — Assessment & Plan Note (Signed)
His symptoms are currently well-controlled. He has minimal orthostasis currently. I've discussed the importance of maintaining adequate hydration.

## 2012-04-21 NOTE — Progress Notes (Signed)
HPI Mr. [name] returns today for followup. 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 In addition he has a history of hypertension. I had not seen the patient in 18 months. During this time he has done well from a syncope perspective. He denies chest pain or shortness of breath. His blood pressure has run between 120 and 150 mmHg. He denies peripheral edema. He has had to take care of his spouse and has become more active in household chores without much trouble. Allergies  Allergen Reactions  . Hydrochlorothiazide     REACTION: hyponatremia with daily use  . Sulfonamide Derivatives     REACTION: rash     Current Outpatient Prescriptions  Medication Sig Dispense Refill  . acetaminophen-codeine (TYLENOL #3) 300-30 MG per tablet Take 1 tablet by mouth 2 (two) times daily as needed. Pain       . amLODipine (NORVASC) 5 MG tablet Take 1 tablet (5 mg total) by mouth every morning.  90 tablet  3  . Ascorbic Acid (VITAMIN C) 500 MG tablet Take 500 mg by mouth 2 (two) times daily.       . cetirizine (ZYRTEC) 10 MG tablet Take 10 mg by mouth daily.        . citalopram (CELEXA) 10 MG tablet Take 1 tablet (10 mg total) by mouth every morning.  90 tablet  3  . clobetasol ointment (TEMOVATE) 0.05 % Apply 1 application topically daily as needed.  30 g  2  . doxycycline (VIBRA-TABS) 100 MG tablet Take 1 tablet (100 mg total) by mouth 2 (two) times daily.  20 tablet  0  . fluticasone (FLONASE) 50 MCG/ACT nasal spray Place 2 sprays into the nose daily as needed. Allergies  48 g  3  . gabapentin (NEURONTIN) 300 MG capsule TAKE 1 CAPSULE THREE TIMES DAILY  AND TAKE 2 CAPSULES AT BEDTIME  450 capsule  PRN  . hydrochlorothiazide (HYDRODIURIL) 25 MG tablet Take 0.5 tablets (12.5 mg total) by mouth daily. As needed for swelling  100 tablet  3  . hydrocortisone (ANUSOL-HC) 25 MG suppository Place 1 suppository (25 mg total) rectally 2 (two) times daily as needed.  Itching  100 suppository  1  . lisinopril (PRINIVIL,ZESTRIL) 40 MG tablet Take 0.5 tablets (20 mg total) by mouth at bedtime.  45 tablet  3  . methylcellulose (ARTIFICIAL TEARS) 1 % ophthalmic solution Place 1 drop into both eyes 2 (two) times daily as needed. Dry eyes        . nitroGLYCERIN (NITROSTAT) 0.4 MG SL tablet Place 1 tablet (0.4 mg total) under the tongue every 5 (five) minutes as needed. Chest pain  60 tablet  5  . omeprazole (PRILOSEC) 20 MG capsule Take 2 capsules (40 mg total) by mouth daily.  180 capsule  3  . primidone (MYSOLINE) 250 MG tablet Take 0.5 tablets (125 mg total) by mouth 3 (three) times daily.  270 tablet  1  . simvastatin (ZOCOR) 40 MG tablet Take 1.5 tablets (60 mg total) by mouth at bedtime.  135 tablet  3  . Tamsulosin HCl (FLOMAX) 0.4 MG CAPS Take 1 capsule (0.4 mg total) by mouth daily.  90 capsule  3  . travoprost, benzalkonium, (TRAVATAN) 0.004 % ophthalmic solution Place 1 drop into both eyes at bedtime.  2.5 mL  2  . triamcinolone cream (KENALOG) 0.1 % APPLY TO AFFECTED AREA TOPICALLY 2 (TWO) TIMES DAILY AS NEEDED. ITCHING/RASH  90 g  3  . ranitidine (ZANTAC) 300 MG capsule Take 1 capsule (300 mg total) by mouth every evening.  90 capsule  3     Past Medical History  Diagnosis Date  . Other chronic nonalcoholic liver disease   . Diverticulosis of colon (without mention of hemorrhage)   . Gastritis   . Esophagitis   . Abdominal tenderness, epigastric   . Bradycardia   . Chest pain   . Dizziness   . Hepatotoxicity     drug induced  . Bladder cancer   . Cough   . Colonic polyp   . Allergic rhinitis   . Renal insufficiency   . CAD (coronary artery disease)   . CHF (congestive heart failure)   . Lumbar disc disease   . PUD (peptic ulcer disease)   . Hiatal hernia     with reflux  . Rheumatoid arthritis   . HTN (hypertension)   . HLD (hyperlipidemia)   . GERD (gastroesophageal reflux disease)   . Depression   . S/P angioplasty   . Tremors  of nervous system     ROS:   All systems reviewed and negative except as noted in the HPI.   Past Surgical History  Procedure Date  . Adenoidectomy   . Coronary angioplasty     percutaneious transluminal  . Tonsillectomy   . Bladder surgery   . Lumbar disc surgery 1/08  . Transurethral resection of bladder tumor   . Lumbar laminectomy/decompression microdiscectomy 07/09/2011    Procedure: LUMBAR LAMINECTOMY/DECOMPRESSION MICRODISCECTOMY;  Surgeon: Javier Docker;  Location: [facility];  Service: Orthopedics;  Laterality: Left;  Decompression L5 - S1 on the Left and repair of dura     Family History  Problem Relation Age of Onset  . Heart attack Mother   . Cancer Father     lung  . Cancer Sister     lung  . Diabetes Sister   . Cancer Brother     lung that spread to brain     History   Social History  . Marital Status: Married    Spouse Name: N/A    Number of Children: N/A  . Years of Education: N/A   Occupational History  . retired from [employer]    Social History Main Topics  . Smoking status: Former Smoker -- 1.5 packs/day for 15 years    Types: Cigarettes    Quit date: 10/07/1985  . Smokeless tobacco: Never Used  . Alcohol Use: No  . Drug Use: No  . Sexually Active: Not on file   Other Topics Concern  . Not on file   Social History Narrative  . No narrative on file     BP 122/68  Pulse 54  Ht 5\' 7"  (1.702 m)  Wt 196 lb (88.905 kg)  BMI 30.70 kg/m2  Physical Exam:  Well appearing 75 year old man, NAD HEENT: Unremarkable Neck:  No JVD, no thyromegally Lungs:  Clear with no wheezes, rales, or rhonchi. HEART:  Regular rate rhythm, no murmurs, no rubs, no clicks Abd:  soft, positive bowel sounds, no organomegally, no rebound, no guarding Ext:  2 plus pulses, no edema, no cyanosis, no clubbing Skin:  No rashes no nodules Neuro:  CN II through XII intact, motor grossly intact  EKG Sinus bradycardia  Assess/Plan:

## 2012-04-24 ENCOUNTER — Encounter: Payer: Self-pay | Admitting: Internal Medicine

## 2012-04-24 DIAGNOSIS — L0390 Cellulitis, unspecified: Secondary | ICD-10-CM | POA: Insufficient documentation

## 2012-04-24 DIAGNOSIS — K649 Unspecified hemorrhoids: Secondary | ICD-10-CM | POA: Insufficient documentation

## 2012-04-24 NOTE — Assessment & Plan Note (Signed)
Right wrist, Mild to mod, for antibx course,  to f/u any worsening symptoms or concerns

## 2012-04-24 NOTE — Progress Notes (Signed)
Subjective:    Patient ID: [name], male    DOB: 04-06-1936, 76 y.o.   MRN: [id number]  HPI  Here with 3 days onset right wrist red/tender/swelling with general weakness and malaise, but no fever, red streaks, or drainage.   Pt denies fever, wt loss, night sweats, loss of appetite, or other constitutional symptoms  Pt denies chest pain, increased sob or doe, wheezing, orthopnea, PND, increased LE swelling, palpitations, dizziness or syncope.  Pt denies new neurological symptoms such as new headache, or facial or extremity weakness or numbness   Pt denies polydipsia, polyuria.  Has been also having hemorrhoid discomfort increased in the past few weeks as well, without increasing pain, fever, drainage or blood. Past Medical History  Diagnosis Date  . Other chronic nonalcoholic liver disease   . Diverticulosis of colon (without mention of hemorrhage)   . Gastritis   . Esophagitis   . Abdominal tenderness, epigastric   . Bradycardia   . Chest pain   . Dizziness   . Hepatotoxicity     drug induced  . Bladder cancer   . Cough   . Colonic polyp   . Allergic rhinitis   . Renal insufficiency   . CAD (coronary artery disease)   . CHF (congestive heart failure)   . Lumbar disc disease   . PUD (peptic ulcer disease)   . Hiatal hernia     with reflux  . Rheumatoid arthritis   . HTN (hypertension)   . HLD (hyperlipidemia)   . GERD (gastroesophageal reflux disease)   . Depression   . S/P angioplasty   . Tremors of nervous system    Past Surgical History  Procedure Date  . Adenoidectomy   . Coronary angioplasty     percutaneious transluminal  . Tonsillectomy   . Bladder surgery   . Lumbar disc surgery 1/08  . Transurethral resection of bladder tumor   . Lumbar laminectomy/decompression microdiscectomy 07/09/2011    Procedure: LUMBAR LAMINECTOMY/DECOMPRESSION MICRODISCECTOMY;  Surgeon: Javier Docker;  Location: [facility];  Service: Orthopedics;  Laterality: Left;  Decompression L5 -  S1 on the Left and repair of dura    reports that he quit smoking about 26 years ago. His smoking use included Cigarettes. He has a 22.5 pack-year smoking history. He has never used smokeless tobacco. He reports that he does not drink alcohol or use illicit drugs. family history includes Cancer in his brother, father, and sister; Diabetes in his sister; and Heart attack in his mother. Allergies  Allergen Reactions  . Hydrochlorothiazide     REACTION: hyponatremia with daily use  . Sulfonamide Derivatives     REACTION: rash   Current Outpatient Prescriptions on File Prior to Visit  Medication Sig Dispense Refill  . acetaminophen-codeine (TYLENOL #3) 300-30 MG per tablet Take 1 tablet by mouth 2 (two) times daily as needed. Pain       . amLODipine (NORVASC) 5 MG tablet Take 1 tablet (5 mg total) by mouth every morning.  90 tablet  3  . Ascorbic Acid (VITAMIN C) 500 MG tablet Take 500 mg by mouth 2 (two) times daily.       . cetirizine (ZYRTEC) 10 MG tablet Take 10 mg by mouth daily.        . citalopram (CELEXA) 10 MG tablet Take 1 tablet (10 mg total) by mouth every morning.  90 tablet  3  . clobetasol ointment (TEMOVATE) 0.05 % Apply 1 application topically daily as needed.  30  g  2  . fluticasone (FLONASE) 50 MCG/ACT nasal spray Place 2 sprays into the nose daily as needed. Allergies  48 g  3  . gabapentin (NEURONTIN) 300 MG capsule TAKE 1 CAPSULE THREE TIMES DAILY  AND TAKE 2 CAPSULES AT BEDTIME  450 capsule  PRN  . hydrochlorothiazide (HYDRODIURIL) 25 MG tablet Take 0.5 tablets (12.5 mg total) by mouth daily. As needed for swelling  100 tablet  3  . lisinopril (PRINIVIL,ZESTRIL) 40 MG tablet Take 0.5 tablets (20 mg total) by mouth at bedtime.  45 tablet  3  . methylcellulose (ARTIFICIAL TEARS) 1 % ophthalmic solution Place 1 drop into both eyes 2 (two) times daily as needed. Dry eyes        . nitroGLYCERIN (NITROSTAT) 0.4 MG SL tablet Place 1 tablet (0.4 mg total) under the tongue every 5  (five) minutes as needed. Chest pain  60 tablet  5  . omeprazole (PRILOSEC) 20 MG capsule Take 2 capsules (40 mg total) by mouth daily.  180 capsule  3  . primidone (MYSOLINE) 250 MG tablet Take 0.5 tablets (125 mg total) by mouth 3 (three) times daily.  270 tablet  1  . ranitidine (ZANTAC) 300 MG capsule Take 1 capsule (300 mg total) by mouth every evening.  90 capsule  3  . simvastatin (ZOCOR) 40 MG tablet Take 1.5 tablets (60 mg total) by mouth at bedtime.  135 tablet  3  . Tamsulosin HCl (FLOMAX) 0.4 MG CAPS Take 1 capsule (0.4 mg total) by mouth daily.  90 capsule  3  . travoprost, benzalkonium, (TRAVATAN) 0.004 % ophthalmic solution Place 1 drop into both eyes at bedtime.  2.5 mL  2  . triamcinolone cream (KENALOG) 0.1 % APPLY TO AFFECTED AREA TOPICALLY 2 (TWO) TIMES DAILY AS NEEDED. ITCHING/RASH  90 g  3   Review of Systems  Constitutional: Negative for diaphoresis and unexpected weight change.  HENT: Negative for tinnitus.   Eyes: Negative for photophobia and visual disturbance.  Respiratory: Negative for choking and stridor.   Gastrointestinal: Negative for vomiting and blood in stool.  Genitourinary: Negative for hematuria and decreased urine volume.  Musculoskeletal: Negative for gait problem.  Neurological: Negative for tremors and numbness.  Psychiatric/Behavioral: Negative for decreased concentration. The patient is not hyperactive.       Objective:   Physical Exam BP 122/72  Pulse 56  Temp 97.1 F (36.2 C) (Oral)  Ht 5\' 7"  (1.702 m)  Wt 197 lb 2 oz (89.415 kg)  BMI 30.87 kg/m2  SpO2 95% Physical Exam  VS noted Constitutional: Pt appears well-developed and well-nourished.  HENT: Head: Normocephalic.  Right Ear: External ear normal.  Left Ear: External ear normal.  Eyes: Conjunctivae and EOM are normal. Pupils are equal, round, and reactive to light.  Neck: Normal range of motion. Neck supple.  Cardiovascular: Normal rate and regular rhythm.   Pulmonary/Chest:  Effort normal and breath sounds normal.  Neurological: Pt is alert. Not confused  Skin: Skin is warm. No erythema. except for right wrist 2 cm area red/tender/swelling without red streaks, fluctuance or drainage Psychiatric: Pt behavior is normal. Thought content normal.     Assessment & Plan:

## 2012-04-24 NOTE — Assessment & Plan Note (Signed)
stable overall by hx and exam, most recent data reviewed with pt, and pt to continue medical treatment as before BP Readings from Last 3 Encounters:  04/21/12 122/68  04/19/12 122/72  03/12/12 110/68

## 2012-04-24 NOTE — Assessment & Plan Note (Signed)
For anusol HC asd,  to f/u any worsening symptoms or concerns

## 2012-05-03 ENCOUNTER — Other Ambulatory Visit: Payer: Self-pay | Admitting: Internal Medicine

## 2012-05-17 DIAGNOSIS — R351 Nocturia: Secondary | ICD-10-CM | POA: Diagnosis not present

## 2012-05-20 DIAGNOSIS — Z8551 Personal history of malignant neoplasm of bladder: Secondary | ICD-10-CM | POA: Diagnosis not present

## 2012-05-20 DIAGNOSIS — Z125 Encounter for screening for malignant neoplasm of prostate: Secondary | ICD-10-CM | POA: Diagnosis not present

## 2012-06-15 DIAGNOSIS — D485 Neoplasm of uncertain behavior of skin: Secondary | ICD-10-CM | POA: Diagnosis not present

## 2012-06-15 DIAGNOSIS — L538 Other specified erythematous conditions: Secondary | ICD-10-CM | POA: Diagnosis not present

## 2012-08-06 ENCOUNTER — Other Ambulatory Visit: Payer: Self-pay | Admitting: Internal Medicine

## 2012-08-06 NOTE — Telephone Encounter (Signed)
Done hardcopy to robin  

## 2012-08-06 NOTE — Telephone Encounter (Signed)
Faxed hardcopy to pharmacy. 

## 2012-09-10 ENCOUNTER — Encounter: Payer: Self-pay | Admitting: Internal Medicine

## 2012-09-10 ENCOUNTER — Ambulatory Visit (INDEPENDENT_AMBULATORY_CARE_PROVIDER_SITE_OTHER): Payer: Medicare Other | Admitting: Internal Medicine

## 2012-09-10 VITALS — BP 118/62 | HR 56 | Temp 97.1°F | Ht 67.0 in | Wt 194.2 lb

## 2012-09-10 DIAGNOSIS — E785 Hyperlipidemia, unspecified: Secondary | ICD-10-CM

## 2012-09-10 DIAGNOSIS — F3289 Other specified depressive episodes: Secondary | ICD-10-CM

## 2012-09-10 DIAGNOSIS — I10 Essential (primary) hypertension: Secondary | ICD-10-CM

## 2012-09-10 MED ORDER — SIMVASTATIN 40 MG PO TABS: 60.0000 mg | ORAL_TABLET | Freq: Every day | ORAL | Status: DC

## 2012-09-10 MED ORDER — PRIMIDONE 250 MG PO TABS: 125.0000 mg | ORAL_TABLET | Freq: Three times a day (TID) | ORAL | Status: DC

## 2012-09-10 MED ORDER — OMEPRAZOLE 20 MG PO CPDR: 40.0000 mg | DELAYED_RELEASE_CAPSULE | Freq: Every day | ORAL | Status: DC

## 2012-09-10 MED ORDER — AMLODIPINE BESYLATE 5 MG PO TABS: 5.0000 mg | ORAL_TABLET | ORAL | Status: DC

## 2012-09-10 MED ORDER — HYDROCHLOROTHIAZIDE 25 MG PO TABS: 25.0000 mg | ORAL_TABLET | Freq: Every day | ORAL | Status: DC

## 2012-09-10 MED ORDER — CITALOPRAM HYDROBROMIDE 10 MG PO TABS: 10.0000 mg | ORAL_TABLET | ORAL | Status: DC

## 2012-09-10 MED ORDER — TAMSULOSIN HCL 0.4 MG PO CAPS: 0.4000 mg | ORAL_CAPSULE | Freq: Every day | ORAL | Status: DC

## 2012-09-10 NOTE — Progress Notes (Signed)
Subjective:    Patient ID: [name], male    DOB: Apr 26, 1936, 77 y.o.   MRN: [id number]  HPI  Here to f/u; overall doing ok,  Pt denies chest pain, increased sob or doe, wheezing, orthopnea, PND, increased LE swelling, palpitations, dizziness or syncope.  Pt denies polydipsia, polyuria, or low sugar symptoms such as weakness or confusion improved with po intake.  Pt denies new neurological symptoms such as new headache, or facial or extremity weakness or numbness.   Pt states overall good compliance with meds, has been trying to follow lower cholesterol diet, with wt overall stable, hard to remain active due to his RA, follow closely per rheum.  Needs med refills.  Denies worsening depressive symptoms, suicidal ideation, or panic Past Medical History  Diagnosis Date  . Other chronic nonalcoholic liver disease   . Diverticulosis of colon (without mention of hemorrhage)   . Gastritis   . Esophagitis   . Abdominal tenderness, epigastric   . Bradycardia   . Chest pain   . Dizziness   . Hepatotoxicity     drug induced  . Bladder cancer   . Cough   . Colonic polyp   . Allergic rhinitis   . Renal insufficiency   . CAD (coronary artery disease)   . CHF (congestive heart failure)   . Lumbar disc disease   . PUD (peptic ulcer disease)   . Hiatal hernia     with reflux  . Rheumatoid arthritis   . HTN (hypertension)   . HLD (hyperlipidemia)   . GERD (gastroesophageal reflux disease)   . Depression   . S/P angioplasty   . Tremors of nervous system    Past Surgical History  Procedure Laterality Date  . Adenoidectomy    . Coronary angioplasty      percutaneious transluminal  . Tonsillectomy    . Bladder surgery    . Lumbar disc surgery  1/08  . Transurethral resection of bladder tumor    . Lumbar laminectomy/decompression microdiscectomy  07/09/2011    Procedure: LUMBAR LAMINECTOMY/DECOMPRESSION MICRODISCECTOMY;  Surgeon: Javier Docker;  Location: [facility];  Service: Orthopedics;   Laterality: Left;  Decompression L5 - S1 on the Left and repair of dura    reports that he quit smoking about 26 years ago. His smoking use included Cigarettes. He has a 22.5 pack-year smoking history. He has never used smokeless tobacco. He reports that he does not drink alcohol or use illicit drugs. family history includes Cancer in his brother, father, and sister; Diabetes in his sister; and Heart attack in his mother. Allergies  Allergen Reactions  . Hydrochlorothiazide     REACTION: hyponatremia with daily use  . Sulfonamide Derivatives     REACTION: rash   Current Outpatient Prescriptions on File Prior to Visit  Medication Sig Dispense Refill  . acetaminophen-codeine (TYLENOL #3) 300-30 MG per tablet Take 1 tablet by mouth 2 (two) times daily as needed. Pain       . Ascorbic Acid (VITAMIN C) 500 MG tablet Take 500 mg by mouth 2 (two) times daily.       . cetirizine (ZYRTEC) 10 MG tablet Take 10 mg by mouth daily.        . clobetasol ointment (TEMOVATE) 0.05 % Apply 1 application topically daily as needed.  30 g  2  . doxycycline (VIBRA-TABS) 100 MG tablet Take 1 tablet (100 mg total) by mouth 2 (two) times daily.  20 tablet  0  . fluticasone (  FLONASE) 50 MCG/ACT nasal spray Place 2 sprays into the nose daily as needed. Allergies  48 g  3  . gabapentin (NEURONTIN) 300 MG capsule TAKE 1 CAPSULE THREE TIMES DAILY  AND TAKE 2 CAPSULES AT BEDTIME  450 capsule  PRN  . HYDROcodone-homatropine (HYCODAN) 5-1.5 MG/5ML syrup TAKE ONE TEASPOONFUL BY MOUTH EVERY 6 HOURS AS NEEDED FOR COUGH  120 mL  0  . hydrocortisone (ANUSOL-HC) 25 MG suppository Place 1 suppository (25 mg total) rectally 2 (two) times daily as needed. Itching  100 suppository  1  . lisinopril (PRINIVIL,ZESTRIL) 40 MG tablet Take 0.5 tablets (20 mg total) by mouth at bedtime.  45 tablet  3  . methylcellulose (ARTIFICIAL TEARS) 1 % ophthalmic solution Place 1 drop into both eyes 2 (two) times daily as needed. Dry eyes        .  nitroGLYCERIN (NITROSTAT) 0.4 MG SL tablet Place 1 tablet (0.4 mg total) under the tongue every 5 (five) minutes as needed. Chest pain  60 tablet  5  . ranitidine (ZANTAC) 300 MG capsule Take 1 capsule (300 mg total) by mouth every evening.  90 capsule  3  . travoprost, benzalkonium, (TRAVATAN) 0.004 % ophthalmic solution Place 1 drop into both eyes at bedtime.  2.5 mL  2  . triamcinolone cream (KENALOG) 0.1 % APPLY TO AFFECTED AREA TOPICALLY 2 (TWO) TIMES DAILY AS NEEDED. ITCHING/RASH  90 g  3  . hydrochlorothiazide (HYDRODIURIL) 25 MG tablet Take 0.5 tablets (12.5 mg total) by mouth daily. As needed for swelling  100 tablet  3   No current facility-administered medications on file prior to visit.   Review of Systems  Constitutional: Negative for unexpected weight change, or unusual diaphoresis  HENT: Negative for tinnitus.   Eyes: Negative for photophobia and visual disturbance.  Respiratory: Negative for choking and stridor.   Gastrointestinal: Negative for vomiting and blood in stool.  Genitourinary: Negative for hematuria and decreased urine volume.  Musculoskeletal: Negative for acute joint swelling Skin: Negative for color change and wound.  Neurological: Negative for tremors and numbness other than noted  Psychiatric/Behavioral: Negative for decreased concentration or  hyperactivity.       Objective:   Physical Exam BP 118/62  Pulse 56  Temp(Src) 97.1 F (36.2 C) (Oral)  Ht 5\' 7"  (1.702 m)  Wt 194 lb 4 oz (88.111 kg)  BMI 30.42 kg/m2  SpO2 94% VS noted, not ill appearing Constitutional: Pt appears well-developed and well-nourished.  HENT: Head: NCAT.  Right Ear: External ear normal.  Left Ear: External ear normal.  Eyes: Conjunctivae and EOM are normal. Pupils are equal, round, and reactive to light.  Neck: Normal range of motion. Neck supple.  Cardiovascular: Normal rate and regular rhythm.   Pulmonary/Chest: Effort normal and breath sounds normal.  Neurological: Pt  is alert. Not confused , motor intact Skin: Skin is warm. No erythema.  Psychiatric: Pt behavior is normal. Thought content normal. Not depressed affect, mild nervous    Assessment & Plan:

## 2012-09-10 NOTE — Patient Instructions (Addendum)
Please continue all other medications as before, and refills have been done if requested. Please forward your lab work from the [place] when available Please continue your efforts at being more active, low cholesterol diet, and weight control. Thank you for enrolling in MyChart. Please follow the instructions below to securely access your online medical record. MyChart allows you to send messages to your doctor, view your test results, renew your prescriptions, schedule appointments, and more. To Log into My Chart online, please go by [employer] or [employer] to [email or web address], or download the MyChart App from the [employer] .  Your Username is: Khair (pass [employer]) Please send a Engineer, water on Mychart later today. Please return in 6 months, or sooner if needed

## 2012-09-12 NOTE — Assessment & Plan Note (Signed)
stable overall by history and exam, recent data reviewed with pt, and pt to continue medical treatment as before,  to f/u any worsening symptoms or concerns BP Readings from Last 3 Encounters:  09/10/12 118/62  04/21/12 122/68  04/19/12 122/72

## 2012-09-12 NOTE — Assessment & Plan Note (Signed)
stable overall by history and exam, recent data reviewed with pt, and pt to continue medical treatment as before,  to f/u any worsening symptoms or concerns Lab Results  Component Value Date   LDLCALC 72 09/12/2010

## 2012-09-12 NOTE — Assessment & Plan Note (Signed)
stable overall by history and exam, recent data reviewed with pt, and pt to continue medical treatment as before,  to f/u any worsening symptoms or concerns Lab Results  Component Value Date   WBC 8.8 07/10/2011   HGB 13.8 07/10/2011   HCT 40.8 07/10/2011   PLT 196 07/10/2011   GLUCOSE 107* 07/10/2011   CHOL 138 09/12/2010   TRIG 120.0 09/12/2010   HDL 42.50 09/12/2010   LDLCALC 72 09/12/2010   ALT 27 07/03/2011   AST 24 07/03/2011   NA 136 07/10/2011   K 4.2 07/10/2011   CL 102 07/10/2011   CREATININE 0.94 07/10/2011   BUN 7 07/10/2011   CO2 30 07/10/2011   TSH 1.09 09/12/2010   PSA 0.21 02/07/2008   INR 1.02 07/03/2011

## 2012-10-18 ENCOUNTER — Ambulatory Visit (INDEPENDENT_AMBULATORY_CARE_PROVIDER_SITE_OTHER): Payer: Medicare Other | Admitting: Internal Medicine

## 2012-10-18 ENCOUNTER — Encounter: Payer: Self-pay | Admitting: Internal Medicine

## 2012-10-18 VITALS — BP 128/62 | HR 65 | Temp 97.8°F | Ht 67.0 in | Wt 198.5 lb

## 2012-10-18 DIAGNOSIS — H6692 Otitis media, unspecified, left ear: Secondary | ICD-10-CM

## 2012-10-18 DIAGNOSIS — J309 Allergic rhinitis, unspecified: Secondary | ICD-10-CM | POA: Diagnosis not present

## 2012-10-18 DIAGNOSIS — H6690 Otitis media, unspecified, unspecified ear: Secondary | ICD-10-CM | POA: Diagnosis not present

## 2012-10-18 MED ORDER — AMOXICILLIN 500 MG PO CAPS: 500.0000 mg | ORAL_CAPSULE | Freq: Three times a day (TID) | ORAL | Status: DC

## 2012-10-18 NOTE — Progress Notes (Signed)
HPI  Pt presents to the clinic today with c/o cold symptoms x 4 days. The worst part is the sore throat and dry cough. He does not produce any sputum. He denies fever, chills or body aches. He has tried cough drops and nothing seems to help. The cough is worse at night. He does have a history of allergies but no asthma. He does not have sick contacts.  Review of Systems      Past Medical History  Diagnosis Date  . Other chronic nonalcoholic liver disease   . Diverticulosis of colon (without mention of hemorrhage)   . Gastritis   . Esophagitis   . Abdominal tenderness, epigastric   . Bradycardia   . Chest pain   . Dizziness   . Hepatotoxicity     drug induced  . Bladder cancer   . Cough   . Colonic polyp   . Allergic rhinitis   . Renal insufficiency   . CAD (coronary artery disease)   . CHF (congestive heart failure)   . Lumbar disc disease   . PUD (peptic ulcer disease)   . Hiatal hernia     with reflux  . Rheumatoid arthritis   . HTN (hypertension)   . HLD (hyperlipidemia)   . GERD (gastroesophageal reflux disease)   . Depression   . S/P angioplasty   . Tremors of nervous system     Family History  Problem Relation Age of Onset  . Heart attack Mother   . Cancer Father     lung  . Cancer Sister     lung  . Diabetes Sister   . Cancer Brother     lung that spread to brain    History   Social History  . Marital Status: Married    Spouse Name: N/A    Number of Children: N/A  . Years of Education: N/A   Occupational History  . retired from [employer]    Social History Main Topics  . Smoking status: Former Smoker -- 1.50 packs/day for 15 years    Types: Cigarettes    Quit date: 10/07/1985  . Smokeless tobacco: Never Used  . Alcohol Use: No  . Drug Use: No  . Sexually Active: Not on file   Other Topics Concern  . Not on file   Social History Narrative  . No narrative on file    Allergies  Allergen Reactions  . Hydrochlorothiazide    REACTION: hyponatremia with daily use  . Sulfonamide Derivatives     REACTION: rash     Constitutional:  Denies headache, fever, fatigue or abrupt weight changes.  HEENT:  Positive sore throat and ear pain. Denies eye redness, eye pain, pressure behind the eyes, facial pain, nasal congestion, ear pain, ringing in the ears, wax buildup, runny nose or bloody nose. Respiratory: Positive cough. Denies difficulty breathing or shortness of breath.  Cardiovascular: Denies chest pain, chest tightness, palpitations or swelling in the hands or feet.   No other specific complaints in a complete review of systems (except as listed in HPI above).  Objective:   BP 128/62  Pulse 65  Temp(Src) 97.8 F (36.6 C) (Oral)  Ht 5\' 7"  (1.702 m)  Wt 198 lb 8 oz (90.039 kg)  BMI 31.08 kg/m2  SpO2 95% Wt Readings from Last 3 Encounters:  10/18/12 198 lb 8 oz (90.039 kg)  09/10/12 194 lb 4 oz (88.111 kg)  04/21/12 196 lb (88.905 kg)     General: Appears his stated  age, well developed, well nourished in NAD. HEENT: Head: normal shape and size; Eyes: sclera white, no icterus, conjunctiva pink, PERRLA and EOMs intact; Ears: Tm's red and intact, distorted light reflex; Nose: mucosa pink and moist, septum midline; Throat/Mouth: + PND. Teeth present, mucosa erythematous and moist, no exudate noted, no lesions or ulcerations noted.  Neck: Mild cervical lymphadenopathy. Neck supple, trachea midline. No massses, lumps or thyromegaly present.  Cardiovascular: Normal rate and rhythm. S1,S2 noted.  No murmur, rubs or gallops noted. No JVD or BLE edema. No carotid bruits noted. Pulmonary/Chest: Normal effort and positive vesicular breath sounds. No respiratory distress. No wheezes, rales or ronchi noted.      Assessment & Plan:   Left otitis media, new onset:  Get some rest and drink plenty of water Do salt water gargles for the sore throat eRx for Amoxil TID x 10 days    Allergic Rhinitis, chronic:  Continue  zyrtec and flonase If cough not improved after abx course, consider looking at other causes ? Lisinopril  RTC as needed or if symptoms persist  RTC as needed or if symptoms persist.

## 2012-10-18 NOTE — Patient Instructions (Signed)

## 2012-10-28 ENCOUNTER — Other Ambulatory Visit (INDEPENDENT_AMBULATORY_CARE_PROVIDER_SITE_OTHER): Payer: Medicare Other

## 2012-10-28 ENCOUNTER — Encounter: Payer: Self-pay | Admitting: Internal Medicine

## 2012-10-28 ENCOUNTER — Ambulatory Visit (INDEPENDENT_AMBULATORY_CARE_PROVIDER_SITE_OTHER): Payer: Medicare Other | Admitting: Internal Medicine

## 2012-10-28 VITALS — BP 132/70 | HR 62 | Temp 97.0°F | Ht 67.0 in | Wt 199.0 lb

## 2012-10-28 DIAGNOSIS — R7302 Impaired glucose tolerance (oral): Secondary | ICD-10-CM

## 2012-10-28 DIAGNOSIS — N320 Bladder-neck obstruction: Secondary | ICD-10-CM

## 2012-10-28 DIAGNOSIS — R7309 Other abnormal glucose: Secondary | ICD-10-CM | POA: Diagnosis not present

## 2012-10-28 DIAGNOSIS — H6690 Otitis media, unspecified, unspecified ear: Secondary | ICD-10-CM

## 2012-10-28 DIAGNOSIS — I10 Essential (primary) hypertension: Secondary | ICD-10-CM

## 2012-10-28 DIAGNOSIS — J309 Allergic rhinitis, unspecified: Secondary | ICD-10-CM

## 2012-10-28 DIAGNOSIS — H6692 Otitis media, unspecified, left ear: Secondary | ICD-10-CM | POA: Insufficient documentation

## 2012-10-28 LAB — CBC WITH DIFFERENTIAL/PLATELET
Basophils Relative: 0.7 % (ref 0.0–3.0)
Eosinophils Relative: 2.1 % (ref 0.0–5.0)
Lymphocytes Relative: 21.9 % (ref 12.0–46.0)
Neutrophils Relative %: 65.5 % (ref 43.0–77.0)
Platelets: 264 10*3/uL (ref 150.0–400.0)
RBC: 4.77 Mil/uL (ref 4.22–5.81)
WBC: 6.3 10*3/uL (ref 4.5–10.5)

## 2012-10-28 LAB — LIPID PANEL
Cholesterol: 147 mg/dL (ref 0–200)
VLDL: 45.2 mg/dL — ABNORMAL HIGH (ref 0.0–40.0)

## 2012-10-28 LAB — URINALYSIS, ROUTINE W REFLEX MICROSCOPIC
Ketones, ur: NEGATIVE
Specific Gravity, Urine: 1.025 (ref 1.000–1.030)
Total Protein, Urine: NEGATIVE
Urine Glucose: NEGATIVE
pH: 6 (ref 5.0–8.0)

## 2012-10-28 LAB — HEPATIC FUNCTION PANEL
ALT: 27 U/L (ref 0–53)
Albumin: 4.2 g/dL (ref 3.5–5.2)
Total Bilirubin: 0.5 mg/dL (ref 0.3–1.2)
Total Protein: 6.7 g/dL (ref 6.0–8.3)

## 2012-10-28 LAB — BASIC METABOLIC PANEL
BUN: 8 mg/dL (ref 6–23)
CO2: 34 mEq/L — ABNORMAL HIGH (ref 19–32)
Chloride: 96 mEq/L (ref 96–112)
Creatinine, Ser: 1.2 mg/dL (ref 0.4–1.5)
Glucose, Bld: 101 mg/dL — ABNORMAL HIGH (ref 70–99)

## 2012-10-28 LAB — PSA: PSA: 0.29 ng/mL (ref 0.10–4.00)

## 2012-10-28 LAB — HEMOGLOBIN A1C: Hgb A1c MFr Bld: 5.7 % (ref 4.6–6.5)

## 2012-10-28 MED ORDER — FLUTICASONE PROPIONATE 50 MCG/ACT NA SUSP: 2.0000 | Freq: Every day | NASAL | Status: DC | PRN

## 2012-10-28 MED ORDER — METHYLPREDNISOLONE ACETATE 80 MG/ML IJ SUSP
80.0000 mg | Freq: Once | INTRAMUSCULAR | Status: AC
  Administered 2012-10-28: 80 mg via INTRAMUSCULAR

## 2012-10-28 MED ORDER — CETIRIZINE HCL 10 MG PO TABS: 10.0000 mg | ORAL_TABLET | Freq: Every day | ORAL | Status: DC

## 2012-10-28 MED ORDER — LEVOFLOXACIN 250 MG PO TABS: 250.0000 mg | ORAL_TABLET | Freq: Every day | ORAL | Status: DC

## 2012-10-28 NOTE — Assessment & Plan Note (Signed)
Mild to mod, for antibx course,  to f/u any worsening symptoms or concerns 

## 2012-10-28 NOTE — Progress Notes (Signed)
Subjective:    Patient ID: [name], male    DOB: 1936-05-10, 77 y.o.   MRN: [id number]  HPI  Here to f/u, last seen with left otitis media recently, better with tx, then worse again in the past few days with fever, ear pain, HA, general weakness and malaise, mild dizziness without sinus pain, ST, cough.  Does have several wks ongoing nasal allergy symptoms with clearish congestion, itch and sneezing, without fever, pain, ST, cough, swelling or wheezing.  Pt denies chest pain, increased sob or doe, wheezing, orthopnea, PND, increased LE swelling, palpitations, dizziness or syncope.  Pt denies polydipsia, polyuria,. Pt denies new neurological symptoms such as new headache, or facial or extremity weakness or numbness Past Medical History  Diagnosis Date  . Other chronic nonalcoholic liver disease   . Diverticulosis of colon (without mention of hemorrhage)   . Gastritis   . Esophagitis   . Abdominal tenderness, epigastric   . Bradycardia   . Chest pain   . Dizziness   . Hepatotoxicity     drug induced  . Bladder cancer   . Cough   . Colonic polyp   . Allergic rhinitis   . Renal insufficiency   . CAD (coronary artery disease)   . CHF (congestive heart failure)   . Lumbar disc disease   . PUD (peptic ulcer disease)   . Hiatal hernia     with reflux  . Rheumatoid arthritis   . HTN (hypertension)   . HLD (hyperlipidemia)   . GERD (gastroesophageal reflux disease)   . Depression   . S/P angioplasty   . Tremors of nervous system    Past Surgical History  Procedure Laterality Date  . Adenoidectomy    . Coronary angioplasty      percutaneious transluminal  . Tonsillectomy    . Bladder surgery    . Lumbar disc surgery  1/08  . Transurethral resection of bladder tumor    . Lumbar laminectomy/decompression microdiscectomy  07/09/2011    Procedure: LUMBAR LAMINECTOMY/DECOMPRESSION MICRODISCECTOMY;  Surgeon: Javier Docker;  Location: [facility];  Service: Orthopedics;  Laterality:  Left;  Decompression L5 - S1 on the Left and repair of dura    reports that he quit smoking about 27 years ago. His smoking use included Cigarettes. He has a 22.5 pack-year smoking history. He has never used smokeless tobacco. He reports that he does not drink alcohol or use illicit drugs. family history includes Cancer in his brother, father, and sister; Diabetes in his sister; and Heart attack in his mother. Allergies  Allergen Reactions  . Hydrochlorothiazide     REACTION: hyponatremia with daily use  . Sulfonamide Derivatives     REACTION: rash   Current Outpatient Prescriptions on File Prior to Visit  Medication Sig Dispense Refill  . acetaminophen-codeine (TYLENOL #3) 300-30 MG per tablet Take 1 tablet by mouth 2 (two) times daily as needed. Pain       . amLODipine (NORVASC) 5 MG tablet Take 1 tablet (5 mg total) by mouth every morning.  90 tablet  3  . Ascorbic Acid (VITAMIN C) 500 MG tablet Take 500 mg by mouth 2 (two) times daily.       . citalopram (CELEXA) 10 MG tablet Take 1 tablet (10 mg total) by mouth every morning.  90 tablet  3  . clobetasol ointment (TEMOVATE) 0.05 % Apply 1 application topically daily as needed.  30 g  2  . gabapentin (NEURONTIN) 300 MG capsule TAKE  1 CAPSULE THREE TIMES DAILY  AND TAKE 2 CAPSULES AT BEDTIME  450 capsule  PRN  . hydrochlorothiazide (HYDRODIURIL) 25 MG tablet Take 1 tablet (25 mg total) by mouth daily.  90 tablet  3  . hydrocortisone (ANUSOL-HC) 25 MG suppository Place 1 suppository (25 mg total) rectally 2 (two) times daily as needed. Itching  100 suppository  1  . lisinopril (PRINIVIL,ZESTRIL) 40 MG tablet Take 0.5 tablets (20 mg total) by mouth at bedtime.  45 tablet  3  . methylcellulose (ARTIFICIAL TEARS) 1 % ophthalmic solution Place 1 drop into both eyes 2 (two) times daily as needed. Dry eyes        . nitroGLYCERIN (NITROSTAT) 0.4 MG SL tablet Place 1 tablet (0.4 mg total) under the tongue every 5 (five) minutes as needed. Chest  pain  60 tablet  5  . omeprazole (PRILOSEC) 20 MG capsule Take 2 capsules (40 mg total) by mouth daily.  180 capsule  3  . primidone (MYSOLINE) 250 MG tablet Take 0.5 tablets (125 mg total) by mouth 3 (three) times daily.  270 tablet  1  . ranitidine (ZANTAC) 300 MG capsule Take 1 capsule (300 mg total) by mouth every evening.  90 capsule  3  . simvastatin (ZOCOR) 40 MG tablet Take 1.5 tablets (60 mg total) by mouth at bedtime.  135 tablet  3  . tamsulosin (FLOMAX) 0.4 MG CAPS Take 1 capsule (0.4 mg total) by mouth daily.  90 capsule  3  . travoprost, benzalkonium, (TRAVATAN) 0.004 % ophthalmic solution Place 1 drop into both eyes at bedtime.  2.5 mL  2  . triamcinolone cream (KENALOG) 0.1 % APPLY TO AFFECTED AREA TOPICALLY 2 (TWO) TIMES DAILY AS NEEDED. ITCHING/RASH  90 g  3   No current facility-administered medications on file prior to visit.    Review of Systems  Constitutional: Negative for unexpected weight change, or unusual diaphoresis  HENT: Negative for tinnitus.   Eyes: Negative for photophobia and visual disturbance.  Respiratory: Negative for choking and stridor.   Gastrointestinal: Negative for vomiting and blood in stool.  Genitourinary: Negative for hematuria and decreased urine volume.  Musculoskeletal: Negative for acute joint swelling Skin: Negative for color change and wound.  Neurological: Negative for tremors and numbness other than noted  Psychiatric/Behavioral: Negative for decreased concentration or  hyperactivity.       Objective:   Physical Exam BP 132/70  Pulse 62  Temp(Src) 97 F (36.1 C) (Oral)  Ht 5\' 7"  (1.702 m)  Wt 199 lb (90.266 kg)  BMI 31.16 kg/m2  SpO2 97% VS noted,  Constitutional: Pt appears well-developed and well-nourished.  HENT: Head: NCAT.  Right Ear: External ear normal.  Left Ear: External ear normal.  Bilat tm's with mild erythema left >> right.  Max sinus areasnon tender.  Pharynx with mild erythema, no exudate Eyes:  Conjunctivae and EOM are normal. Pupils are equal, round, and reactive to light.  Neck: Normal range of motion. Neck supple.  Cardiovascular: Normal rate and regular rhythm.   Pulmonary/Chest: Effort normal and breath sounds normal.  Neurological: Pt is alert. Not confused  Skin: Skin is warm. No erythema.  Psychiatric: Pt behavior is normal. Thought content normal.     Assessment & Plan:

## 2012-10-28 NOTE — Patient Instructions (Signed)
You had the steroid shot today Please take all new medication as prescribed - the antibiotic Please continue all other medications as before, and refills have been done if requested, including the flonase and zyrtec You can also take Mucinex OTC (or it's generic off brand) for congestion and ear fullness, and tylenol as needed for pain. Please go to the LAB in the Basement (turn left off the elevator) for the tests to be done today You will be contacted by phone if any changes need to be made immediately.  Otherwise, you will receive a letter about your results with an explanation, but please check with MyChart first. Thank you for enrolling in MyChart. Please follow the instructions below to securely access your online medical record. MyChart allows you to send messages to your doctor, view your test results, renew your prescriptions, schedule appointments, and more.

## 2012-10-28 NOTE — Assessment & Plan Note (Signed)
stable overall by history and exam, recent data reviewed with pt, and pt to continue medical treatment as before,  to f/u any worsening symptoms or concerns BP Readings from Last 3 Encounters:  10/28/12 132/70  10/18/12 128/62  09/10/12 118/62   For labs today

## 2012-10-28 NOTE — Assessment & Plan Note (Signed)
Mild to mod, for depomedrol IM today,  to f/u any worsening symptoms or concerns, cont current meds

## 2012-10-28 NOTE — Assessment & Plan Note (Signed)
stable overall by history and exam, recent data reviewed with pt, and pt to continue medical treatment as before,  to f/u any worsening symptoms or concerns Lab Results  Component Value Date   HGBA1C 5.7 10/28/2012    

## 2013-01-06 ENCOUNTER — Emergency Department (HOSPITAL_COMMUNITY): Payer: Medicare Other

## 2013-01-06 ENCOUNTER — Emergency Department (HOSPITAL_COMMUNITY)
Admission: EM | Admit: 2013-01-06 | Discharge: 2013-01-06 | Disposition: A | Discharge status: Home / Self Care | Payer: Medicare Other | Attending: Emergency Medicine | Admitting: Emergency Medicine

## 2013-01-06 ENCOUNTER — Encounter (HOSPITAL_COMMUNITY): Payer: Self-pay | Admitting: Emergency Medicine

## 2013-01-06 DIAGNOSIS — I959 Hypotension, unspecified: Secondary | ICD-10-CM | POA: Insufficient documentation

## 2013-01-06 DIAGNOSIS — I10 Essential (primary) hypertension: Secondary | ICD-10-CM | POA: Insufficient documentation

## 2013-01-06 DIAGNOSIS — F329 Major depressive disorder, single episode, unspecified: Secondary | ICD-10-CM | POA: Diagnosis not present

## 2013-01-06 DIAGNOSIS — Z79899 Other long term (current) drug therapy: Secondary | ICD-10-CM | POA: Diagnosis not present

## 2013-01-06 DIAGNOSIS — R0989 Other specified symptoms and signs involving the circulatory and respiratory systems: Secondary | ICD-10-CM | POA: Diagnosis not present

## 2013-01-06 DIAGNOSIS — I498 Other specified cardiac arrhythmias: Secondary | ICD-10-CM | POA: Diagnosis not present

## 2013-01-06 DIAGNOSIS — Z9889 Other specified postprocedural states: Secondary | ICD-10-CM | POA: Insufficient documentation

## 2013-01-06 DIAGNOSIS — Z87891 Personal history of nicotine dependence: Secondary | ICD-10-CM | POA: Insufficient documentation

## 2013-01-06 DIAGNOSIS — I509 Heart failure, unspecified: Secondary | ICD-10-CM | POA: Diagnosis not present

## 2013-01-06 DIAGNOSIS — J9819 Other pulmonary collapse: Secondary | ICD-10-CM | POA: Diagnosis not present

## 2013-01-06 DIAGNOSIS — Z860100 Personal history of colon polyps, unspecified: Secondary | ICD-10-CM | POA: Insufficient documentation

## 2013-01-06 DIAGNOSIS — I2510 Atherosclerotic heart disease of native coronary artery without angina pectoris: Secondary | ICD-10-CM | POA: Diagnosis not present

## 2013-01-06 DIAGNOSIS — R001 Bradycardia, unspecified: Secondary | ICD-10-CM

## 2013-01-06 DIAGNOSIS — R5381 Other malaise: Secondary | ICD-10-CM | POA: Insufficient documentation

## 2013-01-06 DIAGNOSIS — Z8739 Personal history of other diseases of the musculoskeletal system and connective tissue: Secondary | ICD-10-CM | POA: Insufficient documentation

## 2013-01-06 DIAGNOSIS — I951 Orthostatic hypotension: Secondary | ICD-10-CM | POA: Diagnosis not present

## 2013-01-06 DIAGNOSIS — E785 Hyperlipidemia, unspecified: Secondary | ICD-10-CM | POA: Diagnosis not present

## 2013-01-06 DIAGNOSIS — M069 Rheumatoid arthritis, unspecified: Secondary | ICD-10-CM | POA: Diagnosis not present

## 2013-01-06 DIAGNOSIS — M279 Disease of jaws, unspecified: Secondary | ICD-10-CM | POA: Diagnosis not present

## 2013-01-06 DIAGNOSIS — K219 Gastro-esophageal reflux disease without esophagitis: Secondary | ICD-10-CM | POA: Diagnosis not present

## 2013-01-06 DIAGNOSIS — F3289 Other specified depressive episodes: Secondary | ICD-10-CM | POA: Insufficient documentation

## 2013-01-06 DIAGNOSIS — Z8711 Personal history of peptic ulcer disease: Secondary | ICD-10-CM | POA: Insufficient documentation

## 2013-01-06 DIAGNOSIS — Z8719 Personal history of other diseases of the digestive system: Secondary | ICD-10-CM | POA: Diagnosis not present

## 2013-01-06 DIAGNOSIS — R42 Dizziness and giddiness: Secondary | ICD-10-CM | POA: Diagnosis not present

## 2013-01-06 DIAGNOSIS — R404 Transient alteration of awareness: Secondary | ICD-10-CM | POA: Diagnosis not present

## 2013-01-06 DIAGNOSIS — Z8551 Personal history of malignant neoplasm of bladder: Secondary | ICD-10-CM | POA: Insufficient documentation

## 2013-01-06 LAB — COMPREHENSIVE METABOLIC PANEL
Alkaline Phosphatase: 102 U/L (ref 39–117)
BUN: 13 mg/dL (ref 6–23)
Chloride: 89 mEq/L — ABNORMAL LOW (ref 96–112)
GFR calc Af Amer: 83 mL/min — ABNORMAL LOW (ref >90)
Glucose, Bld: 99 mg/dL (ref 70–99)
Potassium: 3.4 mEq/L — ABNORMAL LOW (ref 3.5–5.1)
Total Bilirubin: 0.3 mg/dL (ref 0.3–1.2)

## 2013-01-06 LAB — PROTIME-INR: Prothrombin Time: 13.7 seconds (ref 11.6–15.2)

## 2013-01-06 LAB — CBC
HCT: 41 % (ref 39.0–52.0)
Hemoglobin: 15 g/dL (ref 13.0–17.0)
MCHC: 36.6 g/dL — ABNORMAL HIGH (ref 30.0–36.0)
WBC: 8.1 10*3/uL (ref 4.0–10.5)

## 2013-01-06 MED ORDER — SODIUM CHLORIDE 0.9 % IV BOLUS (SEPSIS)
500.0000 mL | Freq: Once | INTRAVENOUS | Status: AC
  Administered 2013-01-06: 500 mL via INTRAVENOUS

## 2013-01-06 MED ORDER — SODIUM CHLORIDE 0.9 % IV BOLUS (SEPSIS)
1000.0000 mL | Freq: Once | INTRAVENOUS | Status: AC
  Administered 2013-01-06: 1000 mL via INTRAVENOUS

## 2013-01-06 MED ORDER — NITROGLYCERIN 0.4 MG SL SUBL
0.4000 mg | SUBLINGUAL_TABLET | SUBLINGUAL | Status: DC | PRN
  Administered 2013-01-06 (×2): 0.4 mg via SUBLINGUAL
  Filled 2013-01-06: qty 25

## 2013-01-06 MED ORDER — POTASSIUM CHLORIDE CRYS ER 20 MEQ PO TBCR
40.0000 meq | EXTENDED_RELEASE_TABLET | Freq: Once | ORAL | Status: AC
  Administered 2013-01-06: 40 meq via ORAL
  Filled 2013-01-06: qty 2

## 2013-01-06 MED ORDER — ATROPINE SULFATE 1 MG/ML IJ SOLN: 1.0000 mg | Freq: Once | INTRAMUSCULAR | Status: DC

## 2013-01-06 MED ORDER — SODIUM CHLORIDE 0.9 % IV BOLUS (SEPSIS)
2000.0000 mL | Freq: Once | INTRAVENOUS | Status: AC
  Administered 2013-01-06: 2000 mL via INTRAVENOUS

## 2013-01-06 MED ORDER — SODIUM CHLORIDE 0.9 % IV SOLN
20.0000 mL | INTRAVENOUS | Status: DC
  Administered 2013-01-06: 20 mL via INTRAVENOUS

## 2013-01-06 MED ORDER — ATROPINE SULFATE 0.1 MG/ML IJ SOLN
1.0000 mg | Freq: Once | INTRAMUSCULAR | Status: AC
  Administered 2013-01-06: 1 mg via INTRAVENOUS

## 2013-01-06 MED ORDER — ASPIRIN 81 MG PO CHEW
324.0000 mg | CHEWABLE_TABLET | Freq: Once | ORAL | Status: AC
  Administered 2013-01-06: 324 mg via ORAL
  Filled 2013-01-06: qty 4

## 2013-01-06 NOTE — ED Notes (Addendum)
Pt to ED from home with c/o weakness and jaw pain onset this morning at 1000. Pt states this is a recurrent episode. EKG unremarkable. Pt states check his BP-90/44. Pt states he has been experiencing fluctuation in BP (increase/drop) and has been seen by PCP and a cardiologist but no diagnosis. Per CBG-120, BP-140/80 and 127/76, HR-68, RR-16, O2-97% on 2L [place].

## 2013-01-06 NOTE — Progress Notes (Signed)
Patient ID: [name], male   DOB: [birth year], 77 y.o.   MRN: [id number]   CARDIOLOGY CONSULT NOTE  Patient ID: [name] MRN: [id number], DOB/AGE: 75-Aug-1937   Admit date: 01/06/2013 Date of Consult: 01/06/2013   Primary Physician: Casey Barre, MD Primary Cardiologist: Casey Bunting MD  Pt. Profile  [name] is a 77 year old married white male with a history of orthostatic hypotension and syncope. He also has chronic bradycardia and a history of hyponatremia when taking a thiazide diuretic.  He presented to the emergency room today with persistent hypotension. His wife says his systolic blood pressure was 85-90 and he was dizzy. She called EMS.  Yesterday, he noted your and got a little bit too hot. He drank 10 glasses of water. He took his diuretic last night. He had 3 episodes of loose stools starting last night and this morning. He took his amlodipine for his blood pressure this morning. While watering flowers she became lightheaded. He says he used to gets better when he start chocolate and drinks a coke. This did not help.  His sodium is 127,  potassium 3.4.he had left jaw discomfort and was given sublingual nitroglycerin. His blood pressure dropped to 60 and his heart rate went into the high 20s and was sinus bradycardia. Dr. Rosalia Hammers gave him a total of 1750 of normal saline and now his pressure is normal. He also received atropine and his heart rate is in the high 50s and sinus bradycardia. All his EKGs showed no acute changes.  Problem List  Past Medical History  Diagnosis Date  . Other chronic nonalcoholic liver disease   . Diverticulosis of colon (without mention of hemorrhage)   . Gastritis   . Esophagitis   . Abdominal tenderness, epigastric   . Bradycardia   . Chest pain   . Dizziness   . Hepatotoxicity     drug induced  . Bladder cancer   . Cough   . Colonic polyp   . Allergic rhinitis   . Renal insufficiency   . CAD (coronary artery disease)   . CHF (congestive  heart failure)   . Lumbar disc disease   . PUD (peptic ulcer disease)   . Hiatal hernia     with reflux  . Rheumatoid arthritis(714.0)   . HTN (hypertension)   . HLD (hyperlipidemia)   . GERD (gastroesophageal reflux disease)   . Depression   . S/P angioplasty   . Tremors of nervous system     Past Surgical History  Procedure Laterality Date  . Adenoidectomy    . Coronary angioplasty      percutaneious transluminal  . Tonsillectomy    . Bladder surgery    . Lumbar disc surgery  1/08  . Transurethral resection of bladder tumor    . Lumbar laminectomy/decompression microdiscectomy  07/09/2011    Procedure: LUMBAR LAMINECTOMY/DECOMPRESSION MICRODISCECTOMY;  Surgeon: [name];  Location: [facility];  Service: Orthopedics;  Laterality: Left;  Decompression L5 - S1 on the Left and repair of dura     Allergies  Allergies  Allergen Reactions  . Hydrochlorothiazide     REACTION: hyponatremia with daily use  . Sulfonamide Derivatives     REACTION: rash    HPI   See above  Inpatient Medications     Family History Family History  Problem Relation Age of Onset  . Heart attack Mother   . Cancer Father     lung  . Cancer Sister  lung  . Diabetes Sister   . Cancer Brother     lung that spread to brain     Social History History   Social History  . Marital Status: Married    Spouse Name: N/A    Number of Children: N/A  . Years of Education: N/A   Occupational History  . retired from [employer]    Social History Main Topics  . Smoking status: Former Smoker -- 1.50 packs/day for 15 years    Types: Cigarettes    Quit date: 10/07/1985  . Smokeless tobacco: Never Used  . Alcohol Use: No  . Drug Use: No  . Sexually Active: Not on file   Other Topics Concern  . Not on file   Social History Narrative  . No narrative on file     Review of Systems  General:  No chills, fever, night sweats or weight changes.  Cardiovascular:  No chest pain, dyspnea on  exertion, edema, orthopnea, palpitations, paroxysmal nocturnal dyspnea. Dermatological: No rash, lesions/masses Respiratory: No cough, dyspnea Urologic: No hematuria, dysuria Abdominal:   No nausea, vomiting, , bright red blood per rectum, melena, or hematemesis Neurologic:  No visual changes, wkns, changes in mental status. All other systems reviewed and are otherwise negative except as noted above.  Physical Exam  Blood pressure 118/57, pulse 57, temperature 97.8 F (36.6 C), temperature source Oral, resp. rate 14, SpO2 100.00%.  General: Pleasant, NAD Psych: Normal affect. Neuro: Alert and oriented X 3. Moves all extremities spontaneously. HEENT: Normal  Neck: Supple without bruits or JVD. Lungs:  Resp regular and unlabored, CTA. Heart: RRR no s3, s4, or murmurs. Abdomen: Soft, non-tender, non-distended, BS + x 4.  Extremities: No clubbing, cyanosis or edema. DP/PT/Radials 2+ and equal bilaterally.  Labs  No results found for this basename: CKTOTAL, CKMB, TROPONINI,  in the last 72 hours Lab Results  Component Value Date   WBC 8.1 01/06/2013   HGB 15.0 01/06/2013   HCT 41.0 01/06/2013   MCV 85.2 01/06/2013   PLT 243 01/06/2013    Recent Labs Lab 01/06/13 1437  NA 127*  K 3.4*  CL 89*  CO2 28  BUN 13  CREATININE 1.00  CALCIUM 9.0  PROT 6.7  BILITOT 0.3  ALKPHOS 102  ALT 22  AST 19  GLUCOSE 99   Lab Results  Component Value Date   CHOL 147 10/28/2012   HDL 32.50* 10/28/2012   LDLCALC 72 09/12/2010   TRIG 226.0* 10/28/2012   No results found for this basename: DDIMER    Radiology/Studies  Dg Chest Portable 1 View  01/06/2013   *RADIOLOGY REPORT*  Clinical Data: Jaw pain, weakness  PORTABLE CHEST - 1 VIEW  Comparison: Prior chest x-ray 08/26/2011  Findings: External defibrillator pad projects over the left base. Lung volumes are low and there is mild bibasilar atelectasis.  Mild vascular congestion without overt edema.  Overall, the appearance the lungs is very  similar compared to prior.  Cardiac and mediastinal contours within normal limits.  Atherosclerotic calcifications noted in the transverse aorta.  No acute osseous abnormality.  IMPRESSION:  No acute cardiopulmonary disease.  Stable appearance of the chest with mild pulmonary vascular congestion but no overt edema, and low lung volumes with bibasilar atelectasis.   Original Report Authenticated By: Malachy Moan, M.D.    ECG  Sinus bradycardia, otherwise normal EKG  ASSESSMENT AND PLAN #1 persistent hypotension with dizziness. Secondary to taking a diuretic last night, having 3 loose stools he  describes his diarrhea, taking his amlodipine this morning, in the setting of dehydration. I think he became extremely vagal he received nitroglycerin with worsening hypotension and bradycardia.  A long talk with the patient and his wife. Will give him another 500 cc normal saline and let him go home if he feels comfortable and not symptomatic with standing. He will not take any further diuretics. I'll also give him 40 mEq of potassium. He's been advised to keep his blood pressure around 140 to avoid orthostasis. He's been told by Dr. Ladona Ridgel to do this in the past.   Signed, Valera Castle, MD 01/06/2013, 4:35 PM

## 2013-01-06 NOTE — ED Notes (Signed)
Pt denies taking viagra/cialis/levitra.

## 2013-01-06 NOTE — ED Provider Notes (Addendum)
History    CSN: [id number] Arrival date & time 01/06/13  1332  First MD Initiated Contact with Patient 01/06/13 1334     Chief Complaint  Patient presents with  . Jaw Pain  . Weakness   (Consider location/radiation/quality/duration/timing/severity/associated sxs/prior Treatment) HPI  77 year old male with multiple health problems including hypertension who is brought in today due to an episode of hypotension. Patient states he was outside watering the plants when he became very lightheaded and felt weak. He took his blood pressure and it was low to systolic of approximately 85. He states he took multiple times and it continued to be low. He subsequently ate chocolate and drink a Coke which he states has raised his blood pressure in the past. He also laid down and put his legs up. Left jaw pain associated with this. He states he has had this multiple times in the past. His cardiologist is Dr. Ladona Ridgel. He states that he has had multiple heart tests including a Cardiolite and a catheterization which have not revealed any abnormalities. He does take 2 blood pressure medicines on the morning and one at night. He had 2 episodes of loose stool this morning. He has not had any nausea or vomiting. He is otherwise been taking liquids without difficulty. He had not been exerting himself outside. He denies any chest pain. He states he has had left jaw pain in the past and his blood pressure has been low. He came into the hospital for evaluation. Past Medical History  Diagnosis Date  . Other chronic nonalcoholic liver disease   . Diverticulosis of colon (without mention of hemorrhage)   . Gastritis   . Esophagitis   . Abdominal tenderness, epigastric   . Bradycardia   . Chest pain   . Dizziness   . Hepatotoxicity     drug induced  . Bladder cancer   . Cough   . Colonic polyp   . Allergic rhinitis   . Renal insufficiency   . CAD (coronary artery disease)   . CHF (congestive heart failure)   .  Lumbar disc disease   . PUD (peptic ulcer disease)   . Hiatal hernia     with reflux  . Rheumatoid arthritis(714.0)   . HTN (hypertension)   . HLD (hyperlipidemia)   . GERD (gastroesophageal reflux disease)   . Depression   . S/P angioplasty   . Tremors of nervous system    Past Surgical History  Procedure Laterality Date  . Adenoidectomy    . Coronary angioplasty      percutaneious transluminal  . Tonsillectomy    . Bladder surgery    . Lumbar disc surgery  1/08  . Transurethral resection of bladder tumor    . Lumbar laminectomy/decompression microdiscectomy  07/09/2011    Procedure: LUMBAR LAMINECTOMY/DECOMPRESSION MICRODISCECTOMY;  Surgeon: Javier Docker;  Location: [facility];  Service: Orthopedics;  Laterality: Left;  Decompression L5 - S1 on the Left and repair of dura   Family History  Problem Relation Age of Onset  . Heart attack Mother   . Cancer Father     lung  . Cancer Sister     lung  . Diabetes Sister   . Cancer Brother     lung that spread to brain   History  Substance Use Topics  . Smoking status: Former Smoker -- 1.50 packs/day for 15 years    Types: Cigarettes    Quit date: 10/07/1985  . Smokeless tobacco: Never Used  . Alcohol  Use: No    Review of Systems  All other systems reviewed and are negative.    Allergies  Hydrochlorothiazide and Sulfonamide derivatives  Home Medications   Current Outpatient Rx  Name  Route  Sig  Dispense  Refill  . acetaminophen-codeine (TYLENOL #3) 300-30 MG per tablet   Oral   Take 1 tablet by mouth 2 (two) times daily as needed. Pain          . amLODipine (NORVASC) 5 MG tablet   Oral   Take 1 tablet (5 mg total) by mouth every morning.   90 tablet   3   . Ascorbic Acid (VITAMIN C) 500 MG tablet   Oral   Take 500 mg by mouth 2 (two) times daily.          . cetirizine (ZYRTEC) 10 MG tablet   Oral   Take 1 tablet (10 mg total) by mouth daily. As needed   90 tablet   3   . citalopram (CELEXA) 10  MG tablet   Oral   Take 1 tablet (10 mg total) by mouth every morning.   90 tablet   3   . clobetasol ointment (TEMOVATE) 0.05 %   Topical   Apply 1 application topically daily as needed.   30 g   2   . fluticasone (FLONASE) 50 MCG/ACT nasal spray   Nasal   Place 2 sprays into the nose daily as needed. Allergies   48 g   3   . gabapentin (NEURONTIN) 300 MG capsule      TAKE 1 CAPSULE THREE TIMES DAILY  AND TAKE 2 CAPSULES AT BEDTIME   450 capsule   PRN   . hydrochlorothiazide (HYDRODIURIL) 25 MG tablet   Oral   Take 1 tablet (25 mg total) by mouth daily.   90 tablet   3   . hydrocortisone (ANUSOL-HC) 25 MG suppository   Rectal   Place 1 suppository (25 mg total) rectally 2 (two) times daily as needed. Itching   100 suppository   1   . levofloxacin (LEVAQUIN) 250 MG tablet   Oral   Take 1 tablet (250 mg total) by mouth daily.   10 tablet   0   . lisinopril (PRINIVIL,ZESTRIL) 40 MG tablet   Oral   Take 0.5 tablets (20 mg total) by mouth at bedtime.   45 tablet   3   . methylcellulose (ARTIFICIAL TEARS) 1 % ophthalmic solution   Both Eyes   Place 1 drop into both eyes 2 (two) times daily as needed. Dry eyes           . nitroGLYCERIN (NITROSTAT) 0.4 MG SL tablet   Sublingual   Place 1 tablet (0.4 mg total) under the tongue every 5 (five) minutes as needed. Chest pain   60 tablet   5   . omeprazole (PRILOSEC) 20 MG capsule   Oral   Take 2 capsules (40 mg total) by mouth daily.   180 capsule   3   . primidone (MYSOLINE) 250 MG tablet   Oral   Take 0.5 tablets (125 mg total) by mouth 3 (three) times daily.   270 tablet   1   . ranitidine (ZANTAC) 300 MG capsule   Oral   Take 1 capsule (300 mg total) by mouth every evening.   90 capsule   3   . simvastatin (ZOCOR) 40 MG tablet   Oral   Take 1.5 tablets (60 mg total) by mouth  at bedtime.   135 tablet   3   . tamsulosin (FLOMAX) 0.4 MG CAPS   Oral   Take 1 capsule (0.4 mg total) by mouth  daily.   90 capsule   3   . travoprost, benzalkonium, (TRAVATAN) 0.004 % ophthalmic solution   Both Eyes   Place 1 drop into both eyes at bedtime.   2.5 mL   2   . triamcinolone cream (KENALOG) 0.1 %      APPLY TO AFFECTED AREA TOPICALLY 2 (TWO) TIMES DAILY AS NEEDED. ITCHING/RASH   90 g   3    BP 128/60  Pulse 56  Temp(Src) 98 F (36.7 C) (Oral)  Resp 16  SpO2 93% Physical Exam  Nursing note and vitals reviewed. Constitutional: He is oriented to person, place, and time. He appears well-developed and well-nourished.  HENT:  Head: Normocephalic and atraumatic.  Right Ear: External ear normal.  Left Ear: External ear normal.  Nose: Nose normal.  Mouth/Throat: Oropharynx is clear and moist.  Eyes: Conjunctivae and EOM are normal. Pupils are equal, round, and reactive to light.  Neck: Normal range of motion. Neck supple.  Cardiovascular: Normal rate, regular rhythm, normal heart sounds and intact distal pulses.   Pulmonary/Chest: Effort normal and breath sounds normal. No respiratory distress. He has no wheezes. He exhibits no tenderness.  Abdominal: Soft. Bowel sounds are normal. He exhibits no distension and no mass. There is no tenderness. There is no guarding.  Musculoskeletal: Normal range of motion.  Neurological: He is alert and oriented to person, place, and time. He has normal reflexes. He exhibits normal muscle tone. Coordination normal.  Skin: Skin is warm and dry.  Psychiatric: He has a normal mood and affect. His behavior is normal. Judgment and thought content normal.    ED Course  Procedures (including critical care time) Labs Reviewed - No data to display No results found. No diagnosis found.    Date: 01/06/2013  Rate: 57  Rhythm: normal sinus rhythm  QRS Axis: normal  Intervals: normal  ST/T Wave abnormalities: nonspecific ST changes  Conduction Disutrbances:none  Narrative Interpretation:   Old EKG Reviewed: inferior and lateral nsst unchanged  fromp prior ekg,    MDM  2:59 PM Called to bedside do to patient's decreased responsiveness, low heart rate, and low blood pressure. On my arrival heart rate decreased down to 27 with systolic blood pressure measured in the 60s. Patient had eyes open with some staring. He was responsive to verbal stimuli. His head of the bed was lowered to flat. Atropine was given to the patient. Heart rate increased to the 60s. Nursing stated that he received 2 sublingual nitroglycerin just prior to this. He states the jaw pain has resolved. External pacer pads are placed. Patient is receiving 1 L of normal saline.BP now 84.  Second liter ordered to be infused simultaneously with first.    Repeat ekg ordered.  Cardiology paged  3:32 PM Hr 76 bp 118/55.  Discussed with cardiology and they will be in to see.    Hilario Quarry, MD 01/06/13 1532  Dr. Daleen Squibb saw and evaluated patient.  He is aware of the above.  He feels the patient was dehydrated and had hypotension from the nitro as well as a vagal response.  Please see his full consult.  He advises the patient can be discharged after receiving more iv fluids and has discussed follow up and symptomatology with the patient.   Hilario Quarry, MD 01/06/13  1820 

## 2013-01-06 NOTE — ED Notes (Signed)
Pt placed on pads and crash cart left at bedside.

## 2013-01-06 NOTE — ED Notes (Signed)
Pt brady at rate of 30's on monitor. Dr. Rosalia Hammers at the bedside. Crash cart being obtained.

## 2013-01-10 ENCOUNTER — Ambulatory Visit (INDEPENDENT_AMBULATORY_CARE_PROVIDER_SITE_OTHER): Payer: Medicare Other | Admitting: Internal Medicine

## 2013-01-10 ENCOUNTER — Encounter: Payer: Self-pay | Admitting: Internal Medicine

## 2013-01-10 VITALS — BP 140/78 | HR 48 | Temp 97.0°F | Ht 67.0 in | Wt 196.5 lb

## 2013-01-10 DIAGNOSIS — R7309 Other abnormal glucose: Secondary | ICD-10-CM | POA: Diagnosis not present

## 2013-01-10 DIAGNOSIS — R7302 Impaired glucose tolerance (oral): Secondary | ICD-10-CM

## 2013-01-10 DIAGNOSIS — R55 Syncope and collapse: Secondary | ICD-10-CM | POA: Insufficient documentation

## 2013-01-10 DIAGNOSIS — E785 Hyperlipidemia, unspecified: Secondary | ICD-10-CM | POA: Diagnosis not present

## 2013-01-10 DIAGNOSIS — I10 Essential (primary) hypertension: Secondary | ICD-10-CM

## 2013-01-10 MED ORDER — TRIAMCINOLONE ACETONIDE 0.1 % EX CREA: 1.0000 "application " | TOPICAL_CREAM | Freq: Every day | CUTANEOUS | Status: DC | PRN

## 2013-01-10 MED FILL — Medication: Qty: 1 | Status: AC

## 2013-01-10 NOTE — Assessment & Plan Note (Signed)
Resolved, asympt, pt agrees to avoid further dehydration in the setting of mult meds, which are to be cont'd

## 2013-01-10 NOTE — Assessment & Plan Note (Signed)
stable overall by history and exam, recent data reviewed with pt, and pt to continue medical treatment as before,  to f/u any worsening symptoms or concerns BP Readings from Last 3 Encounters:  01/10/13 140/78  01/06/13 113/52  10/28/12 132/70

## 2013-01-10 NOTE — Assessment & Plan Note (Signed)
stable overall by history and exam, recent data reviewed with pt, and pt to continue medical treatment as before,  to f/u any worsening symptoms or concerns Lab Results  Component Value Date   LDLCALC 72 09/12/2010

## 2013-01-10 NOTE — Progress Notes (Signed)
Subjective:    Patient ID: [name], male    DOB: 01-04-1936, 77 y.o.   MRN: [id number]  HPI  Here to f/u, after probable severe vagal episode involving his meds and ? Accidental overuse of lisiopril qhs prn, dehydration/diarrhea after sweating/mowing the yard, and Ntg. No syncope but very close.  Fortunately improved with fluids and time in ER, did not require temp pacing.  Seen per Dr Clearnce Hasten.  Approp med use reinforced.  Pt is o/w quite compliant and diligent, Pt denies chest pain, increased sob or doe, wheezing, orthopnea, PND, increased LE swelling, palpitations, dizziness or syncope. Pt denies new neurological symptoms such as new headache, or facial or extremity weakness or numbness   Pt denies polydipsia, polyuria. Most recent cbg;s in low 100's. Overall pain level ok, RA controlled, no current active synovitis Past Medical History  Diagnosis Date  . Other chronic nonalcoholic liver disease   . Diverticulosis of colon (without mention of hemorrhage)   . Gastritis   . Esophagitis   . Abdominal tenderness, epigastric   . Bradycardia   . Chest pain   . Dizziness   . Hepatotoxicity     drug induced  . Bladder cancer   . Cough   . Colonic polyp   . Allergic rhinitis   . Renal insufficiency   . CAD (coronary artery disease)   . CHF (congestive heart failure)   . Lumbar disc disease   . PUD (peptic ulcer disease)   . Hiatal hernia     with reflux  . Rheumatoid arthritis(714.0)   . HTN (hypertension)   . HLD (hyperlipidemia)   . GERD (gastroesophageal reflux disease)   . Depression   . S/P angioplasty   . Tremors of nervous system    Past Surgical History  Procedure Laterality Date  . Adenoidectomy    . Coronary angioplasty      percutaneious transluminal  . Tonsillectomy    . Bladder surgery    . Lumbar disc surgery  1/08  . Transurethral resection of bladder tumor    . Lumbar laminectomy/decompression microdiscectomy  07/09/2011    Procedure: LUMBAR  LAMINECTOMY/DECOMPRESSION MICRODISCECTOMY;  Surgeon: Javier Docker;  Location: [facility];  Service: Orthopedics;  Laterality: Left;  Decompression L5 - S1 on the Left and repair of dura    reports that he quit smoking about 27 years ago. His smoking use included Cigarettes. He has a 22.5 pack-year smoking history. He has never used smokeless tobacco. He reports that he does not drink alcohol or use illicit drugs. family history includes Cancer in his brother, father, and sister; Diabetes in his sister; and Heart attack in his mother. Allergies  Allergen Reactions  . Hydrochlorothiazide     REACTION: hyponatremia with daily use  . Sulfonamide Derivatives     REACTION: rash   Current Outpatient Prescriptions on File Prior to Visit  Medication Sig Dispense Refill  . acetaminophen-codeine (TYLENOL #3) 300-30 MG per tablet Take 1 tablet by mouth 2 (two) times daily as needed. Pain       . amLODipine (NORVASC) 5 MG tablet Take 1 tablet (5 mg total) by mouth every morning.  90 tablet  3  . Ascorbic Acid (VITAMIN C) 500 MG tablet Take 500 mg by mouth 2 (two) times daily.       . cetirizine (ZYRTEC) 10 MG tablet Take 1 tablet (10 mg total) by mouth daily. As needed  90 tablet  3  . citalopram (CELEXA) 10 MG tablet  Take 1 tablet (10 mg total) by mouth every morning.  90 tablet  3  . fluticasone (FLONASE) 50 MCG/ACT nasal spray Place 2 sprays into the nose daily as needed. Allergies  48 g  3  . gabapentin (NEURONTIN) 300 MG capsule Take 300-600 mg by mouth 4 (four) times daily. 300 mg three times during the day and 600 mg at night.      . hydrocortisone (ANUSOL-HC) 25 MG suppository Place 1 suppository (25 mg total) rectally 2 (two) times daily as needed. Itching  100 suppository  1  . lisinopril (PRINIVIL,ZESTRIL) 40 MG tablet Take 0.5 tablets (20 mg total) by mouth at bedtime.  45 tablet  3  . methylcellulose (ARTIFICIAL TEARS) 1 % ophthalmic solution Place 1 drop into both eyes 2 (two) times daily as  needed. Dry eyes        . nitroGLYCERIN (NITROSTAT) 0.4 MG SL tablet Place 1 tablet (0.4 mg total) under the tongue every 5 (five) minutes as needed. Chest pain  60 tablet  5  . omeprazole (PRILOSEC) 20 MG capsule Take 20 mg by mouth 2 (two) times daily.      . primidone (MYSOLINE) 250 MG tablet Take 0.5 tablets (125 mg total) by mouth 3 (three) times daily.  270 tablet  1  . ranitidine (ZANTAC) 300 MG capsule Take 1 capsule (300 mg total) by mouth every evening.  90 capsule  3  . simvastatin (ZOCOR) 40 MG tablet Take 1.5 tablets (60 mg total) by mouth at bedtime.  135 tablet  3  . tamsulosin (FLOMAX) 0.4 MG CAPS Take 1 capsule (0.4 mg total) by mouth daily.  90 capsule  3  . travoprost, benzalkonium, (TRAVATAN) 0.004 % ophthalmic solution Place 1 drop into both eyes at bedtime.  2.5 mL  2   No current facility-administered medications on file prior to visit.   Review of Systems  Constitutional: Negative for unexpected weight change, or unusual diaphoresis  HENT: Negative for tinnitus.   Eyes: Negative for photophobia and visual disturbance.  Respiratory: Negative for choking and stridor.   Gastrointestinal: Negative for vomiting and blood in stool.  Genitourinary: Negative for hematuria and decreased urine volume.  Musculoskeletal: Negative for acute joint swelling Skin: Negative for color change and wound.  Neurological: Negative for tremors and numbness other than noted  Psychiatric/Behavioral: Negative for decreased concentration or  hyperactivity.       Objective:   Physical Exam BP 140/78  Pulse 48  Temp(Src) 97 F (36.1 C) (Oral)  Ht 5\' 7"  (1.702 m)  Wt 196 lb 8 oz (89.132 kg)  BMI 30.77 kg/m2  SpO2 97% VS noted,  Constitutional: Pt appears well-developed and well-nourished.  HENT: Head: NCAT.  Right Ear: External ear normal.  Left Ear: External ear normal.  Eyes: Conjunctivae and EOM are normal. Pupils are equal, round, and reactive to light.  Neck: Normal range of  motion. Neck supple.  Cardiovascular: Normal rate and regular rhythm.   Pulmonary/Chest: Effort normal and breath sounds normal.  Abd:  Soft, NT, non-distended, + BS Neurological: Pt is alert. Not confused  Skin: Skin is warm. No erythema.  Psychiatric: Pt behavior is normal. Thought content normal.  No synovitis    Assessment & Plan:

## 2013-01-10 NOTE — Assessment & Plan Note (Signed)
stable overall by history and exam, recent data reviewed with pt, and pt to continue medical treatment as before,  to f/u any worsening symptoms or concerns Lab Results  Component Value Date   HGBA1C 5.7 10/28/2012    

## 2013-01-10 NOTE — Patient Instructions (Addendum)
Please continue all other medications as before, and refills have been done if requested. - the cream  Please remember to sign up for My Chart if you have not done so, as this will be important to you in the future with finding out test results, communicating by private email, and scheduling acute appointments online when needed.  Please return in 6 months, or sooner if needed

## 2013-01-18 ENCOUNTER — Other Ambulatory Visit: Payer: Self-pay

## 2013-01-18 DIAGNOSIS — I10 Essential (primary) hypertension: Secondary | ICD-10-CM

## 2013-01-18 MED ORDER — LISINOPRIL 40 MG PO TABS: 20.0000 mg | ORAL_TABLET | Freq: Every day | ORAL | Status: DC

## 2013-02-07 ENCOUNTER — Telehealth: Payer: Self-pay | Admitting: *Deleted

## 2013-02-07 NOTE — Telephone Encounter (Signed)
Pt called requesting to take 1/2 tablet of Amlodipine.  Please advise

## 2013-02-07 NOTE — Telephone Encounter (Signed)
Can pt state reason for wanting to do this to better understand why.  Thanks

## 2013-02-08 NOTE — Telephone Encounter (Signed)
Patient informed. 

## 2013-02-08 NOTE — Telephone Encounter (Signed)
Sounds ok, please continue same tx

## 2013-02-08 NOTE — Telephone Encounter (Signed)
Called the patient and he stated he checked his BP yesterday morning and it was 143/86, he took a pill , checked his BP later on in the day and it had dropped to 118 (he forgot the bottom number).  He stated Dr. Ladona Ridgel stated if his BP was normal was ok to take only 1/2 a pill.

## 2013-02-14 ENCOUNTER — Other Ambulatory Visit: Payer: Self-pay

## 2013-02-15 MED ORDER — GABAPENTIN 300 MG PO CAPS: ORAL_CAPSULE | ORAL | Status: DC

## 2013-02-15 NOTE — Telephone Encounter (Signed)
Done erx 

## 2013-03-22 DIAGNOSIS — H409 Unspecified glaucoma: Secondary | ICD-10-CM | POA: Diagnosis not present

## 2013-03-22 DIAGNOSIS — H401290 Low-tension glaucoma, unspecified eye, stage unspecified: Secondary | ICD-10-CM | POA: Diagnosis not present

## 2013-04-08 ENCOUNTER — Other Ambulatory Visit: Payer: Self-pay | Admitting: Internal Medicine

## 2013-04-19 DIAGNOSIS — Z23 Encounter for immunization: Secondary | ICD-10-CM | POA: Diagnosis not present

## 2013-05-17 DIAGNOSIS — L723 Sebaceous cyst: Secondary | ICD-10-CM | POA: Diagnosis not present

## 2013-05-17 DIAGNOSIS — B356 Tinea cruris: Secondary | ICD-10-CM | POA: Diagnosis not present

## 2013-05-19 DIAGNOSIS — Z8551 Personal history of malignant neoplasm of bladder: Secondary | ICD-10-CM | POA: Diagnosis not present

## 2013-05-19 DIAGNOSIS — R39198 Other difficulties with micturition: Secondary | ICD-10-CM | POA: Diagnosis not present

## 2013-05-19 DIAGNOSIS — R350 Frequency of micturition: Secondary | ICD-10-CM | POA: Diagnosis not present

## 2013-05-19 DIAGNOSIS — R3915 Urgency of urination: Secondary | ICD-10-CM | POA: Diagnosis not present

## 2013-06-03 DIAGNOSIS — R350 Frequency of micturition: Secondary | ICD-10-CM | POA: Diagnosis not present

## 2013-06-03 DIAGNOSIS — R39198 Other difficulties with micturition: Secondary | ICD-10-CM | POA: Diagnosis not present

## 2013-06-03 DIAGNOSIS — R3915 Urgency of urination: Secondary | ICD-10-CM | POA: Diagnosis not present

## 2013-06-08 ENCOUNTER — Telehealth: Payer: Self-pay

## 2013-06-08 MED ORDER — SIMVASTATIN 40 MG PO TABS: 60.0000 mg | ORAL_TABLET | Freq: Every day | ORAL | Status: DC

## 2013-06-08 NOTE — Telephone Encounter (Signed)
Mail order refill 

## 2013-06-21 DIAGNOSIS — M48061 Spinal stenosis, lumbar region without neurogenic claudication: Secondary | ICD-10-CM | POA: Diagnosis not present

## 2013-06-21 DIAGNOSIS — IMO0002 Reserved for concepts with insufficient information to code with codable children: Secondary | ICD-10-CM | POA: Diagnosis not present

## 2013-06-29 DIAGNOSIS — M48061 Spinal stenosis, lumbar region without neurogenic claudication: Secondary | ICD-10-CM | POA: Diagnosis not present

## 2013-07-11 DIAGNOSIS — M5126 Other intervertebral disc displacement, lumbar region: Secondary | ICD-10-CM | POA: Diagnosis not present

## 2013-07-13 ENCOUNTER — Other Ambulatory Visit: Payer: Self-pay | Admitting: Orthopedic Surgery

## 2013-07-13 NOTE — H&P (Signed)
[name] is an 77 y.o. male.   Chief Complaint: back and leg pain HPI: The patient is a 77 year old male being followed for their low back symptoms. They are now 5 week(s) out from a flare up following a fall on his left side. Symptoms reported today include: pain and leg pain (bilateral R > L). The patient states that they are doing poorly. Current treatment includes: relative rest, activity modification and pain medications. The following medication has been used for pain control: Tylenol or Norco.   [name] follows up with severe pain into his right leg persistent. No help from the Dosepak, home exercise program, activity modifications.  He reports he is unable to tolerate his current situation. No fevers, chills, chest pain, or shortness of breath.  Past Medical History  Diagnosis Date  . Other chronic nonalcoholic liver disease   . Diverticulosis of colon (without mention of hemorrhage)   . Gastritis   . Esophagitis   . Abdominal tenderness, epigastric   . Bradycardia   . Chest pain   . Dizziness   . Hepatotoxicity     drug induced  . Bladder cancer   . Cough   . Colonic polyp   . Allergic rhinitis   . Renal insufficiency   . CAD (coronary artery disease)   . CHF (congestive heart failure)   . Lumbar disc disease   . PUD (peptic ulcer disease)   . Hiatal hernia     with reflux  . Rheumatoid arthritis(714.0)   . HTN (hypertension)   . HLD (hyperlipidemia)   . GERD (gastroesophageal reflux disease)   . Depression   . S/P angioplasty   . Tremors of nervous system     Past Surgical History  Procedure Laterality Date  . Adenoidectomy    . Coronary angioplasty      percutaneious transluminal  . Tonsillectomy    . Bladder surgery    . Lumbar disc surgery  1/08  . Transurethral resection of bladder tumor    . Lumbar laminectomy/decompression microdiscectomy  07/09/2011    Procedure: LUMBAR LAMINECTOMY/DECOMPRESSION MICRODISCECTOMY;  Surgeon: Jeffrey C Beane;   Location: [facility];  Service: Orthopedics;  Laterality: Left;  Decompression L5 - S1 on the Left and repair of dura    Family History  Problem Relation Age of Onset  . Heart attack Mother   . Cancer Father     lung  . Cancer Sister     lung  . Diabetes Sister   . Cancer Brother     lung that spread to brain   Social History:  reports that he quit smoking about 27 years ago. His smoking use included Cigarettes. He has a 22.5 pack-year smoking history. He has never used smokeless tobacco. He reports that he does not drink alcohol or use illicit drugs.  Allergies:  Allergies  Allergen Reactions  . Hydrochlorothiazide     REACTION: hyponatremia with daily use  . Sulfonamide Derivatives     REACTION: rash     (Not in a hospital admission)  No results found for this or any previous visit (from the past 48 hour(s)). No results found.  Review of Systems  Constitutional: Negative.   HENT: Negative.   Eyes: Negative.   Respiratory: Negative.   Cardiovascular: Negative.   Gastrointestinal: Negative.   Genitourinary: Negative.   Musculoskeletal: Positive for back pain.  Skin: Negative.   Neurological: Positive for sensory change and focal weakness.  Endo/Heme/Allergies: Negative.   Psychiatric/Behavioral:   Negative.     There were no vitals taken for this visit. Physical Exam  Constitutional: He is oriented to person, place, and time. He appears well-developed and well-nourished.  HENT:  Head: Normocephalic and atraumatic.  Eyes: Conjunctivae and EOM are normal. Pupils are equal, round, and reactive to light.  Neck: Normal range of motion. Neck supple.  Cardiovascular: Normal rate and regular rhythm.   Respiratory: Effort normal and breath sounds normal.  GI: Soft. Bowel sounds are normal.  Musculoskeletal:  On exam he is in severe distress. He walks with an antalgic gait. Mood and affect are appropriate. Straight leg raise produces buttock, thigh, and calf pain on the  right, negative on the left. EHL and dorsiflexion are 4+/5. He has weakness in his gluteus and abductors as well. Slightly altered sensation L5 dermatome. He has no significant pain with extension and flexion of the lumbar spine.  Neurological: He is alert and oriented to person, place, and time. He has normal reflexes.  Skin: Skin is warm and dry.  Psychiatric: He has a normal mood and affect.    MRI indicates facet arthrosis and a large synovial cyst 2x2x1-1/2 centimeters deforming the thecal sac, encroaching on the right S5 nerve root, into the lateral recess. Degenerative changes at 5-1. Degenerative changes at the sacroiliac joint.  He has minimal scoliosis. Good disc space. No instability in flexion and extension.  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 Refractory to rest, activity modifications. Requiring narcotic analgesics.  We discussed options, the severity of his symptoms, and I do not feel that aspiration or epidural is going to be efficacious given the size of this neurocompressive lesion. He is not having ongoing back pain. We discussed decompression and excision of the cyst with possible recurrence of the cyst. Depending upon the portion of the cyst to have removed we may use some bone into the lateral recess for lateral mass fusion. He will undergo his preoperative clearance with Dr. John. He has had no chest pain, shortness of breath, fevers, or chills recently. Again, either living with his symptoms or the decompression. Give his pain at this point in time and neurocompressive lesion it is appropriate to proceed. He has had a hemilaminectomy at L5 on the left in the past, decompression at 4-5, removed the residual hemilamina of 5 on the right. Utilize possible allograft, bone graft. He will undergo  preoperative clearance and have him scheduled accordingly.  I had an extensive discussion of the risks and benefits of the lumbar decompression with the patient including bleeding, infection, damage to neurovascular structures, epidural fibrosis, CSF leak requiring repair. We also discussed increase in pain, adjacent segment disease, recurrent disc herniation, need for future surgery including repeat decompression and/or fusion. We also discussed risks of postoperative hematoma, paralysis, anesthetic complications including DVT, PE, death, cardiopulmonary dysfunction. In addition, the perioperative and postoperative courses were discussed in detail including the rehabilitative time and return to functional activity and work. I provided the patient with an illustrated handout and utilized the appropriate surgical models.  Plan microlumbar decompression L4-5 with excision of synovial cyst, possible lateral mass fusion  BISSELL, JACLYN M.  For Dr. Beane  07/13/2013, 1:18 PM    

## 2013-07-15 ENCOUNTER — Other Ambulatory Visit: Payer: Self-pay

## 2013-07-15 MED ORDER — CITALOPRAM HYDROBROMIDE 10 MG PO TABS: 10.0000 mg | ORAL_TABLET | ORAL | Status: DC

## 2013-07-15 MED ORDER — TAMSULOSIN HCL 0.4 MG PO CAPS: 0.4000 mg | ORAL_CAPSULE | Freq: Every day | ORAL | Status: DC

## 2013-07-18 ENCOUNTER — Other Ambulatory Visit: Payer: Self-pay | Admitting: *Deleted

## 2013-07-18 MED ORDER — TAMSULOSIN HCL 0.4 MG PO CAPS: 0.4000 mg | ORAL_CAPSULE | Freq: Every day | ORAL | Status: DC

## 2013-07-18 MED ORDER — CITALOPRAM HYDROBROMIDE 10 MG PO TABS: 10.0000 mg | ORAL_TABLET | ORAL | Status: DC

## 2013-07-18 MED ORDER — HYDROCHLOROTHIAZIDE 25 MG PO TABS: 25.0000 mg | ORAL_TABLET | Freq: Every day | ORAL | Status: DC

## 2013-07-20 ENCOUNTER — Encounter (HOSPITAL_COMMUNITY): Payer: Self-pay | Admitting: Pharmacy Technician

## 2013-07-21 ENCOUNTER — Encounter: Payer: Self-pay | Admitting: Internal Medicine

## 2013-07-21 ENCOUNTER — Ambulatory Visit (INDEPENDENT_AMBULATORY_CARE_PROVIDER_SITE_OTHER): Payer: Medicare Other | Admitting: Internal Medicine

## 2013-07-21 VITALS — BP 136/70 | HR 64 | Temp 97.7°F | Resp 16 | Wt 195.0 lb

## 2013-07-21 DIAGNOSIS — I10 Essential (primary) hypertension: Secondary | ICD-10-CM

## 2013-07-21 DIAGNOSIS — F3289 Other specified depressive episodes: Secondary | ICD-10-CM | POA: Diagnosis not present

## 2013-07-21 DIAGNOSIS — R220 Localized swelling, mass and lump, head: Secondary | ICD-10-CM

## 2013-07-21 DIAGNOSIS — R221 Localized swelling, mass and lump, neck: Secondary | ICD-10-CM | POA: Diagnosis not present

## 2013-07-21 DIAGNOSIS — F329 Major depressive disorder, single episode, unspecified: Secondary | ICD-10-CM

## 2013-07-21 MED ORDER — LISINOPRIL 10 MG PO TABS: 10.0000 mg | ORAL_TABLET | Freq: Every day | ORAL | Status: DC

## 2013-07-21 MED ORDER — HYDROCHLOROTHIAZIDE 25 MG PO TABS: 25.0000 mg | ORAL_TABLET | Freq: Every day | ORAL | Status: DC

## 2013-07-21 NOTE — Patient Instructions (Signed)
OK to decrease the lisinopril to 10 mg per day (and a new prescription was sent to the pharmacy) Ok to take the HCTZ fluid pill as you have been doing. I believe your right neck nodule is likely related to the Rheumatoid arthritis, but you should see Dr Ernesto Rutherford or Dr Lucia Gaskins if this enlarges or persists over the next few wks  Please keep your appointments with your specialists as you have planned - for the lower back

## 2013-07-21 NOTE — Assessment & Plan Note (Signed)
Ok to reduce the ACE to 10 mg qd, cont all other meds

## 2013-07-21 NOTE — Progress Notes (Signed)
Subjective:    Patient ID: [name], male    DOB: 05-26-1936, 78 y.o.   MRN: [id number]  HPI  Here with 2 mo small subq nodule right mid lateral neck without change such as enlargement, pain, drainage.  Noticed incidentally after a fall where he hit left side head on ground, wonders if could be related somehow. No ST, fever, hoarseness, wt loss, lower appetite.  Pt continues to have recurring right LBP without change in severity, bowel or bladder change, fever, wt loss,  worsening LE pain/numbness/weakness, gait change or falls. Has a known cystic structure to right low lumbar and plan is for surgury soon.  Also mentions takes BP meds but with card approval he takes the amlod and lisinopril 20 in the am, with 1/2 -1 hctz 25 in the pm for BP > 160; states BP after the lisinopril seems to bottom out occas and feels poorly with weakness and BP to 90.  Denies worsening depressive symptoms, suicidal ideation, or panic Past Medical History  Diagnosis Date  . Other chronic nonalcoholic liver disease   . Diverticulosis of colon (without mention of hemorrhage)   . Gastritis   . Esophagitis   . Abdominal tenderness, epigastric   . Bradycardia   . Chest pain   . Dizziness   . Hepatotoxicity     drug induced  . Bladder cancer   . Cough   . Colonic polyp   . Allergic rhinitis   . Renal insufficiency   . CAD (coronary artery disease)   . CHF (congestive heart failure)   . Lumbar disc disease   . PUD (peptic ulcer disease)   . Hiatal hernia     with reflux  . Rheumatoid arthritis(714.0)   . HTN (hypertension)   . HLD (hyperlipidemia)   . GERD (gastroesophageal reflux disease)   . Depression   . S/P angioplasty   . Tremors of nervous system    Past Surgical History  Procedure Laterality Date  . Adenoidectomy    . Coronary angioplasty      percutaneious transluminal  . Tonsillectomy    . Bladder surgery    . Lumbar disc surgery  1/08  . Transurethral resection of bladder tumor    .  Lumbar laminectomy/decompression microdiscectomy  07/09/2011    Procedure: LUMBAR LAMINECTOMY/DECOMPRESSION MICRODISCECTOMY;  Surgeon: Johnn Hai;  Location: [facility];  Service: Orthopedics;  Laterality: Left;  Decompression L5 - S1 on the Left and repair of dura    reports that he quit smoking about 27 years ago. His smoking use included Cigarettes. He has a 22.5 pack-year smoking history. He has never used smokeless tobacco. He reports that he does not drink alcohol or use illicit drugs. family history includes Cancer in his brother, father, and sister; Diabetes in his sister; Heart attack in his mother. Allergies  Allergen Reactions  . Hydrochlorothiazide     REACTION: hyponatremia with daily use  . Morphine And Related     Migraine   . Sulfonamide Derivatives     REACTION: rash   Current Outpatient Prescriptions on File Prior to Visit  Medication Sig Dispense Refill  . acetaminophen-codeine (TYLENOL #3) 300-30 MG per tablet Take 1 tablet by mouth 2 (two) times daily as needed for moderate pain or severe pain. Pain       . amLODipine (NORVASC) 5 MG tablet Take 5 mg by mouth every morning.      . Ascorbic Acid (VITAMIN C) 500 MG tablet Take 500  mg by mouth 2 (two) times daily.       . cetirizine (ZYRTEC) 10 MG tablet Take 10 mg by mouth daily as needed for allergies. As needed      . citalopram (CELEXA) 10 MG tablet Take 10 mg by mouth every morning.      . docusate sodium (COLACE) 100 MG capsule Take 100 mg by mouth 2 (two) times daily.      . fluticasone (FLONASE) 50 MCG/ACT nasal spray Place 2 sprays into the nose daily as needed for rhinitis. Allergies      . gabapentin (NEURONTIN) 300 MG capsule Take 300-600 mg by mouth 4 (four) times daily - after meals and at bedtime.      . hydrocortisone (ANUSOL-HC) 25 MG suppository Place 25 mg rectally 2 (two) times daily as needed for hemorrhoids. Itching      . methylcellulose (ARTIFICIAL TEARS) 1 % ophthalmic solution Place 1 drop into  both eyes 2 (two) times daily as needed (dry eyes).       . nitroGLYCERIN (NITROSTAT) 0.4 MG SL tablet Place 1 tablet (0.4 mg total) under the tongue every 5 (five) minutes as needed. Chest pain  60 tablet  5  . omeprazole (PRILOSEC) 20 MG capsule Take 20 mg by mouth 2 (two) times daily.      . pimecrolimus (ELIDEL) 1 % cream Apply 1 application topically 2 (two) times daily.      . primidone (MYSOLINE) 250 MG tablet Take 125 mg by mouth 3 (three) times daily.      . ranitidine (ZANTAC) 300 MG capsule Take 1 capsule (300 mg total) by mouth every evening.  90 capsule  3  . simvastatin (ZOCOR) 40 MG tablet Take 60 mg by mouth at bedtime.      . tamsulosin (FLOMAX) 0.4 MG CAPS capsule Take 0.4 mg by mouth daily.      . travoprost, benzalkonium, (TRAVATAN) 0.004 % ophthalmic solution Place 1 drop into both eyes at bedtime.       No current facility-administered medications on file prior to visit.   Review of Systems  Constitutional: Negative for unexpected weight change, or unusual diaphoresis  HENT: Negative for tinnitus.   Eyes: Negative for photophobia and visual disturbance.  Respiratory: Negative for choking and stridor.   Gastrointestinal: Negative for vomiting and blood in stool.  Genitourinary: Negative for hematuria and decreased urine volume.  Musculoskeletal: Negative for acute joint swelling Skin: Negative for color change and wound.  Neurological: Negative for tremors and numbness other than noted  Psychiatric/Behavioral: Negative for decreased concentration or  hyperactivity.       Objective:   Physical Exam BP 136/70  Pulse 64  Temp(Src) 97.7 F (36.5 C) (Oral)  Resp 16  Wt 195 lb (88.451 kg)  SpO2 94% VS noted,  Constitutional: Pt appears well-developed and well-nourished.  HENT: Head: NCAT.  Right Ear: External ear normal.  Left Ear: External ear normal.  Right mid lateral neck with 1/2 cm subq firm NT nodule just post to the scm; ? LN vs other; no other mass or LA  noted to neck Eyes: Conjunctivae and EOM are normal. Pupils are equal, round, and reactive to light.  Neck: Normal range of motion. Neck supple.  Cardiovascular: Normal rate and regular rhythm.   Pulmonary/Chest: Effort normal and breath sounds normal.  Abd:  Soft, NT, non-distended, + BS - no HSM Neurological: Pt is alert. Not confused  Skin: Skin is warm. No erythema.  Psychiatric: Pt behavior  is normal. Thought content normal. not depressed affect    Assessment & Plan:

## 2013-07-21 NOTE — Progress Notes (Signed)
Pre-visit discussion using our clinic review tool. No additional management support is needed unless otherwise documented below in the visit note.  

## 2013-07-21 NOTE — Assessment & Plan Note (Signed)
I think c/w rheum subq nodule, prob ok to continue to monitor but pt also plans to self refer to ENT and i have given name of Dr Ernesto Rutherford and Dr Lucia Gaskins

## 2013-07-21 NOTE — Assessment & Plan Note (Signed)
stable overall by history and exam, recent data reviewed with pt, and pt to continue medical treatment as before,  to f/u any worsening symptoms or concerns Lab Results  Component Value Date   WBC 8.1 01/06/2013   HGB 15.0 01/06/2013   HCT 41.0 01/06/2013   PLT 243 01/06/2013   GLUCOSE 99 01/06/2013   CHOL 147 10/28/2012   TRIG 226.0* 10/28/2012   HDL 32.50* 10/28/2012   LDLDIRECT 93.6 10/28/2012   LDLCALC 72 09/12/2010   ALT 22 01/06/2013   AST 19 01/06/2013   NA 127* 01/06/2013   K 3.4* 01/06/2013   CL 89* 01/06/2013   CREATININE 1.00 01/06/2013   BUN 13 01/06/2013   CO2 28 01/06/2013   TSH 2.09 10/28/2012   PSA 0.29 10/28/2012   INR 1.07 01/06/2013   HGBA1C 5.7 10/28/2012

## 2013-07-22 ENCOUNTER — Telehealth: Payer: Self-pay | Admitting: Internal Medicine

## 2013-07-22 NOTE — Telephone Encounter (Signed)
Relevant patient education assigned to patient using Emmi. ° °

## 2013-07-25 ENCOUNTER — Encounter (HOSPITAL_COMMUNITY): Payer: Self-pay

## 2013-07-25 ENCOUNTER — Encounter (HOSPITAL_COMMUNITY)
Admission: RE | Admit: 2013-07-25 | Discharge: 2013-07-25 | Disposition: A | Discharge status: Home / Self Care | Payer: Medicare Other | Source: Ambulatory Visit | Attending: Specialist | Admitting: Specialist

## 2013-07-25 ENCOUNTER — Ambulatory Visit (HOSPITAL_COMMUNITY)
Admission: RE | Admit: 2013-07-25 | Discharge: 2013-07-25 | Disposition: A | Discharge status: Home / Self Care | Payer: Medicare Other | Source: Ambulatory Visit | Attending: Orthopedic Surgery | Admitting: Orthopedic Surgery

## 2013-07-25 DIAGNOSIS — Z0183 Encounter for blood typing: Secondary | ICD-10-CM | POA: Insufficient documentation

## 2013-07-25 DIAGNOSIS — Z01812 Encounter for preprocedural laboratory examination: Secondary | ICD-10-CM | POA: Insufficient documentation

## 2013-07-25 DIAGNOSIS — I700 Atherosclerosis of aorta: Secondary | ICD-10-CM | POA: Diagnosis not present

## 2013-07-25 DIAGNOSIS — M545 Low back pain, unspecified: Secondary | ICD-10-CM | POA: Diagnosis present

## 2013-07-25 DIAGNOSIS — M5137 Other intervertebral disc degeneration, lumbosacral region: Secondary | ICD-10-CM | POA: Diagnosis not present

## 2013-07-25 DIAGNOSIS — M47817 Spondylosis without myelopathy or radiculopathy, lumbosacral region: Secondary | ICD-10-CM | POA: Diagnosis not present

## 2013-07-25 LAB — CBC
HCT: 44.2 % (ref 39.0–52.0)
Hemoglobin: 15.6 g/dL (ref 13.0–17.0)
MCH: 30.5 pg (ref 26.0–34.0)
MCHC: 35.3 g/dL (ref 30.0–36.0)
MCV: 86.5 fL (ref 78.0–100.0)
PLATELETS: 221 10*3/uL (ref 150–400)
RBC: 5.11 MIL/uL (ref 4.22–5.81)
RDW: 12.5 % (ref 11.5–15.5)
WBC: 6.4 10*3/uL (ref 4.0–10.5)

## 2013-07-25 LAB — BASIC METABOLIC PANEL
BUN: 12 mg/dL (ref 6–23)
CO2: 28 mEq/L (ref 19–32)
CREATININE: 0.92 mg/dL (ref 0.50–1.35)
Calcium: 9.3 mg/dL (ref 8.4–10.5)
Chloride: 95 mEq/L — ABNORMAL LOW (ref 96–112)
GFR calc Af Amer: >90 mL/min (ref >90)
GFR, EST NON AFRICAN AMERICAN: 80 mL/min — AB (ref >90)
GLUCOSE: 108 mg/dL — AB (ref 70–99)
Potassium: 3.7 mEq/L (ref 3.7–5.3)
SODIUM: 136 meq/L — AB (ref 137–147)

## 2013-07-25 LAB — SURGICAL PCR SCREEN
MRSA, PCR: NEGATIVE
STAPHYLOCOCCUS AUREUS: POSITIVE — AB

## 2013-07-25 LAB — ABO/RH: ABO/RH(D): AB POS

## 2013-07-25 NOTE — Pre-Procedure Instructions (Addendum)
07-25-13 EKG 6'14/ Chest 1 view 6'14-Epic. Back lumbar spine xray done today. 07-26-13 0840 Pt. And Dr. Reather Littler office notified of Positive Staph aureus-will need Mupirocin tx.

## 2013-07-25 NOTE — Patient Instructions (Addendum)
[place]  07/25/2013   Your procedure is scheduled on:1-14   -2015  Report to [facility] at   [place]     AM   Call this number if you have problems the morning of surgery: [phone]  Or Presurgical Testing [phone](Ellaina Schuler) For Living Will and/or Health Care Power Attorney Forms: please provide copy for your medical record,may bring AM of surgery(Forms should be already notarized -we do not provide this service).      Do not eat food:After Midnight.    Take these medicines the morning of surgery with A SIP OF WATER: Amlodipine(if needed). Citalopram. Gabapentin. Omeprazole. Primidone. Norco. Ranitidine.  Use/ bring flonase, eye drops.    Do not wear jewelry, make-up or nail polish.  Do not wear lotions, powders, or perfumes. You may wear deodorant.  Do not shave 12 hours prior to first CHG shower(legs and under arms).(face and neck okay.)  Do not bring valuables to the hospital.(Hospital is not responsible for lost valuables).  Contacts, dentures or removable bridgework, body piercing, hair pins may not be worn into surgery.  Leave suitcase in the car. After surgery it may be brought to your room.  For patients admitted to the hospital, checkout time is 11:00 AM the day of discharge.(Restricted visitors-Persons, age 15 or younger - may not visit at this time.)    Patients discharged the day of surgery will not be allowed to drive home. Must have responsible person with you x 24 hours once discharged.  Name and phone number of your driver: son Mali [phone] cell  Special Instructions: CHG(Chlorhedine 4%-"Hibiclens","Betasept","Aplicare") Shower Use Special Wash: see special instructions.(avoid face and genitals)   Please read over the following fact sheets that you were given: MRSA Information, Blood Transfusion fact sheet, Incentive Spirometry Instruction.  Remember : Type/Screen "Blue armbands" - may not be removed once applied(would result in  being retested AM of surgery, if removed).  Failure to follow these instructions may result in Cancellation of your surgery.   Patient signature_______________________________________________________

## 2013-07-26 ENCOUNTER — Other Ambulatory Visit: Payer: Self-pay | Admitting: Orthopedic Surgery

## 2013-07-26 NOTE — Progress Notes (Signed)
07-26-13 0840 Pt. Positive for Staph aureus PCR screen will need tx. Mupirocin called to [facility] -[phone]

## 2013-07-27 ENCOUNTER — Inpatient Hospital Stay (HOSPITAL_COMMUNITY): Payer: Medicare Other

## 2013-07-27 ENCOUNTER — Inpatient Hospital Stay (HOSPITAL_COMMUNITY)
Admission: RE | Admit: 2013-07-27 | Discharge: 2013-07-28 | DRG: 460 | Disposition: A | Discharge status: Home / Self Care | Payer: Medicare Other | Source: Ambulatory Visit | Attending: Specialist | Admitting: Specialist

## 2013-07-27 ENCOUNTER — Encounter (HOSPITAL_COMMUNITY): Payer: Self-pay | Admitting: *Deleted

## 2013-07-27 ENCOUNTER — Encounter (HOSPITAL_COMMUNITY): Payer: Medicare Other | Admitting: Anesthesiology

## 2013-07-27 ENCOUNTER — Inpatient Hospital Stay (HOSPITAL_COMMUNITY): Payer: Medicare Other | Admitting: Anesthesiology

## 2013-07-27 ENCOUNTER — Encounter (HOSPITAL_COMMUNITY)
Admission: RE | Disposition: A | Discharge status: Home / Self Care | Payer: Self-pay | Source: Ambulatory Visit | Attending: Specialist

## 2013-07-27 DIAGNOSIS — F3289 Other specified depressive episodes: Secondary | ICD-10-CM | POA: Diagnosis present

## 2013-07-27 DIAGNOSIS — M069 Rheumatoid arthritis, unspecified: Secondary | ICD-10-CM | POA: Diagnosis present

## 2013-07-27 DIAGNOSIS — E785 Hyperlipidemia, unspecified: Secondary | ICD-10-CM | POA: Diagnosis not present

## 2013-07-27 DIAGNOSIS — I10 Essential (primary) hypertension: Secondary | ICD-10-CM | POA: Diagnosis present

## 2013-07-27 DIAGNOSIS — M48061 Spinal stenosis, lumbar region without neurogenic claudication: Secondary | ICD-10-CM | POA: Diagnosis present

## 2013-07-27 DIAGNOSIS — I2510 Atherosclerotic heart disease of native coronary artery without angina pectoris: Secondary | ICD-10-CM | POA: Diagnosis present

## 2013-07-27 DIAGNOSIS — Z888 Allergy status to other drugs, medicaments and biological substances status: Secondary | ICD-10-CM | POA: Diagnosis not present

## 2013-07-27 DIAGNOSIS — M549 Dorsalgia, unspecified: Secondary | ICD-10-CM | POA: Diagnosis not present

## 2013-07-27 DIAGNOSIS — M79609 Pain in unspecified limb: Secondary | ICD-10-CM | POA: Diagnosis present

## 2013-07-27 DIAGNOSIS — M7130 Other bursal cyst, unspecified site: Principal | ICD-10-CM | POA: Diagnosis present

## 2013-07-27 DIAGNOSIS — Z87891 Personal history of nicotine dependence: Secondary | ICD-10-CM

## 2013-07-27 DIAGNOSIS — Z8711 Personal history of peptic ulcer disease: Secondary | ICD-10-CM | POA: Diagnosis not present

## 2013-07-27 DIAGNOSIS — K769 Liver disease, unspecified: Secondary | ICD-10-CM | POA: Diagnosis present

## 2013-07-27 DIAGNOSIS — K219 Gastro-esophageal reflux disease without esophagitis: Secondary | ICD-10-CM | POA: Diagnosis present

## 2013-07-27 DIAGNOSIS — M5126 Other intervertebral disc displacement, lumbar region: Secondary | ICD-10-CM | POA: Diagnosis not present

## 2013-07-27 DIAGNOSIS — Z8551 Personal history of malignant neoplasm of bladder: Secondary | ICD-10-CM

## 2013-07-27 DIAGNOSIS — F329 Major depressive disorder, single episode, unspecified: Secondary | ICD-10-CM | POA: Diagnosis present

## 2013-07-27 DIAGNOSIS — Z9861 Coronary angioplasty status: Secondary | ICD-10-CM

## 2013-07-27 DIAGNOSIS — Z9181 History of falling: Secondary | ICD-10-CM | POA: Diagnosis not present

## 2013-07-27 DIAGNOSIS — K5730 Diverticulosis of large intestine without perforation or abscess without bleeding: Secondary | ICD-10-CM | POA: Diagnosis not present

## 2013-07-27 DIAGNOSIS — M519 Unspecified thoracic, thoracolumbar and lumbosacral intervertebral disc disorder: Secondary | ICD-10-CM | POA: Diagnosis not present

## 2013-07-27 HISTORY — PX: LUMBAR LAMINECTOMY/DECOMPRESSION MICRODISCECTOMY: SHX5026

## 2013-07-27 LAB — TYPE AND SCREEN
ABO/RH(D): AB POS
ANTIBODY SCREEN: NEGATIVE

## 2013-07-27 SURGERY — LUMBAR LAMINECTOMY/DECOMPRESSION MICRODISCECTOMY 1 LEVEL
Anesthesia: General | Site: Back

## 2013-07-27 MED ORDER — ZOLPIDEM TARTRATE 5 MG PO TABS: 5.0000 mg | ORAL_TABLET | Freq: Every evening | ORAL | Status: DC | PRN

## 2013-07-27 MED ORDER — DEXAMETHASONE SODIUM PHOSPHATE 10 MG/ML IJ SOLN
INTRAMUSCULAR | Status: AC
  Filled 2013-07-27: qty 1

## 2013-07-27 MED ORDER — BUPIVACAINE-EPINEPHRINE 0.5% -1:200000 IJ SOLN
INTRAMUSCULAR | Status: DC | PRN
  Administered 2013-07-27: 4 mL

## 2013-07-27 MED ORDER — METHOCARBAMOL 100 MG/ML IJ SOLN
500.0000 mg | Freq: Four times a day (QID) | INTRAVENOUS | Status: DC | PRN
  Administered 2013-07-27: 500 mg via INTRAVENOUS
  Filled 2013-07-27: qty 5

## 2013-07-27 MED ORDER — SUFENTANIL CITRATE 50 MCG/ML IV SOLN
INTRAVENOUS | Status: DC | PRN
  Administered 2013-07-27: 5 ug via INTRAVENOUS
  Administered 2013-07-27: 20 ug via INTRAVENOUS
  Administered 2013-07-27 (×2): 5 ug via INTRAVENOUS

## 2013-07-27 MED ORDER — NITROGLYCERIN 0.4 MG SL SUBL: 0.4000 mg | SUBLINGUAL_TABLET | SUBLINGUAL | Status: DC | PRN

## 2013-07-27 MED ORDER — NEOSTIGMINE METHYLSULFATE 1 MG/ML IJ SOLN
INTRAMUSCULAR | Status: DC | PRN
  Administered 2013-07-27: 4 mg via INTRAVENOUS

## 2013-07-27 MED ORDER — SODIUM CHLORIDE 0.45 % IV SOLN
INTRAVENOUS | Status: DC
  Administered 2013-07-27: 1000 mL via INTRAVENOUS

## 2013-07-27 MED ORDER — LACTATED RINGERS IV SOLN: INTRAVENOUS | Status: DC

## 2013-07-27 MED ORDER — HYDROMORPHONE HCL PF 1 MG/ML IJ SOLN
0.2500 mg | INTRAMUSCULAR | Status: DC | PRN
  Administered 2013-07-27: 0.25 mg via INTRAVENOUS

## 2013-07-27 MED ORDER — THROMBIN 5000 UNITS EX SOLR
CUTANEOUS | Status: AC
  Filled 2013-07-27: qty 10000

## 2013-07-27 MED ORDER — GLYCOPYRROLATE 0.2 MG/ML IJ SOLN
INTRAMUSCULAR | Status: DC | PRN
  Administered 2013-07-27: .6 mg via INTRAVENOUS

## 2013-07-27 MED ORDER — OXYCODONE-ACETAMINOPHEN 5-325 MG PO TABS: 1.0000 | ORAL_TABLET | ORAL | Status: DC | PRN

## 2013-07-27 MED ORDER — PHENOL 1.4 % MT LIQD: 1.0000 | OROMUCOSAL | Status: DC | PRN

## 2013-07-27 MED ORDER — SUCCINYLCHOLINE CHLORIDE 20 MG/ML IJ SOLN
INTRAMUSCULAR | Status: DC | PRN
  Administered 2013-07-27: 100 mg via INTRAVENOUS

## 2013-07-27 MED ORDER — HYDROMORPHONE HCL PF 1 MG/ML IJ SOLN: 0.5000 mg | INTRAMUSCULAR | Status: DC | PRN

## 2013-07-27 MED ORDER — EPHEDRINE SULFATE 50 MG/ML IJ SOLN
INTRAMUSCULAR | Status: DC | PRN
  Administered 2013-07-27 (×3): 10 mg via INTRAVENOUS

## 2013-07-27 MED ORDER — CLINDAMYCIN PHOSPHATE 900 MG/50ML IV SOLN
900.0000 mg | INTRAVENOUS | Status: AC
  Administered 2013-07-27: 900 mg via INTRAVENOUS

## 2013-07-27 MED ORDER — GABAPENTIN 300 MG PO CAPS
300.0000 mg | ORAL_CAPSULE | Freq: Three times a day (TID) | ORAL | Status: DC
  Administered 2013-07-27 – 2013-07-28 (×3): 300 mg via ORAL
  Filled 2013-07-27 (×5): qty 1

## 2013-07-27 MED ORDER — FAMOTIDINE 10 MG PO TABS
10.0000 mg | ORAL_TABLET | Freq: Every day | ORAL | Status: DC
  Administered 2013-07-27: 22:00:00 10 mg via ORAL
  Filled 2013-07-27 (×3): qty 1

## 2013-07-27 MED ORDER — ONDANSETRON HCL 4 MG/2ML IJ SOLN
INTRAMUSCULAR | Status: DC | PRN
  Administered 2013-07-27: 4 mg via INTRAVENOUS

## 2013-07-27 MED ORDER — CEFAZOLIN SODIUM-DEXTROSE 2-3 GM-% IV SOLR
INTRAVENOUS | Status: AC
  Filled 2013-07-27: qty 50

## 2013-07-27 MED ORDER — HYDROCODONE-ACETAMINOPHEN 5-325 MG PO TABS
1.0000 | ORAL_TABLET | ORAL | Status: DC | PRN
  Administered 2013-07-27 – 2013-07-28 (×4): 2 via ORAL
  Filled 2013-07-27 (×4): qty 2

## 2013-07-27 MED ORDER — CEFAZOLIN SODIUM-DEXTROSE 2-3 GM-% IV SOLR
2.0000 g | Freq: Three times a day (TID) | INTRAVENOUS | Status: AC
  Administered 2013-07-27 (×2): 2 g via INTRAVENOUS
  Filled 2013-07-27 (×2): qty 50

## 2013-07-27 MED ORDER — ACETAMINOPHEN 650 MG RE SUPP: 650.0000 mg | RECTAL | Status: DC | PRN

## 2013-07-27 MED ORDER — METHYLCELLULOSE 1 % OP SOLN: 1.0000 [drp] | Freq: Two times a day (BID) | OPHTHALMIC | Status: DC | PRN

## 2013-07-27 MED ORDER — HYDROCHLOROTHIAZIDE 25 MG PO TABS
25.0000 mg | ORAL_TABLET | Freq: Every day | ORAL | Status: DC
  Filled 2013-07-27: qty 1

## 2013-07-27 MED ORDER — MIDAZOLAM HCL 2 MG/2ML IJ SOLN
INTRAMUSCULAR | Status: AC
  Filled 2013-07-27: qty 2

## 2013-07-27 MED ORDER — CITALOPRAM HYDROBROMIDE 10 MG PO TABS
10.0000 mg | ORAL_TABLET | Freq: Every day | ORAL | Status: DC
  Administered 2013-07-28: 10 mg via ORAL
  Filled 2013-07-27: qty 1

## 2013-07-27 MED ORDER — HYDROCORTISONE ACETATE 25 MG RE SUPP
25.0000 mg | Freq: Two times a day (BID) | RECTAL | Status: DC | PRN
  Filled 2013-07-27: qty 1

## 2013-07-27 MED ORDER — SODIUM CHLORIDE 0.9 % IJ SOLN: 3.0000 mL | INTRAMUSCULAR | Status: DC | PRN

## 2013-07-27 MED ORDER — TAMSULOSIN HCL 0.4 MG PO CAPS
0.4000 mg | ORAL_CAPSULE | Freq: Every day | ORAL | Status: DC
  Administered 2013-07-27 – 2013-07-28 (×2): 0.4 mg via ORAL
  Filled 2013-07-27 (×2): qty 1

## 2013-07-27 MED ORDER — ONDANSETRON HCL 4 MG/2ML IJ SOLN: 4.0000 mg | INTRAMUSCULAR | Status: DC | PRN

## 2013-07-27 MED ORDER — AMLODIPINE BESYLATE 5 MG PO TABS
5.0000 mg | ORAL_TABLET | Freq: Every day | ORAL | Status: DC
  Administered 2013-07-28: 10:00:00 5 mg via ORAL
  Filled 2013-07-27: qty 1

## 2013-07-27 MED ORDER — DOCUSATE SODIUM 100 MG PO CAPS: 100.0000 mg | ORAL_CAPSULE | Freq: Two times a day (BID) | ORAL | Status: DC

## 2013-07-27 MED ORDER — CLINDAMYCIN PHOSPHATE 900 MG/50ML IV SOLN
INTRAVENOUS | Status: AC
  Filled 2013-07-27: qty 50

## 2013-07-27 MED ORDER — FLUTICASONE PROPIONATE 50 MCG/ACT NA SUSP
2.0000 | Freq: Every day | NASAL | Status: DC | PRN
  Filled 2013-07-27: qty 16

## 2013-07-27 MED ORDER — SODIUM CHLORIDE 0.9 % IV SOLN: 250.0000 mL | INTRAVENOUS | Status: DC

## 2013-07-27 MED ORDER — CHLORHEXIDINE GLUCONATE 4 % EX LIQD: 60.0000 mL | Freq: Once | CUTANEOUS | Status: DC

## 2013-07-27 MED ORDER — LISINOPRIL 20 MG PO TABS: 20.0000 mg | ORAL_TABLET | Freq: Every day | ORAL | Status: DC

## 2013-07-27 MED ORDER — LISINOPRIL 10 MG PO TABS: 10.0000 mg | ORAL_TABLET | Freq: Every day | ORAL | Status: DC

## 2013-07-27 MED ORDER — PROPOFOL 10 MG/ML IV BOLUS
INTRAVENOUS | Status: DC | PRN
  Administered 2013-07-27: 140 mg via INTRAVENOUS

## 2013-07-27 MED ORDER — DOCUSATE SODIUM 100 MG PO CAPS
100.0000 mg | ORAL_CAPSULE | Freq: Two times a day (BID) | ORAL | Status: DC
  Administered 2013-07-27 – 2013-07-28 (×2): 100 mg via ORAL

## 2013-07-27 MED ORDER — DEXAMETHASONE SODIUM PHOSPHATE 10 MG/ML IJ SOLN
INTRAMUSCULAR | Status: DC | PRN
  Administered 2013-07-27: 10 mg via INTRAVENOUS

## 2013-07-27 MED ORDER — GABAPENTIN 300 MG PO CAPS
600.0000 mg | ORAL_CAPSULE | Freq: Every day | ORAL | Status: DC
  Administered 2013-07-27: 23:00:00 600 mg via ORAL
  Filled 2013-07-27 (×2): qty 2

## 2013-07-27 MED ORDER — TRAVOPROST (BAK FREE) 0.004 % OP SOLN
1.0000 [drp] | Freq: Every day | OPHTHALMIC | Status: DC
  Filled 2013-07-27: qty 2.5

## 2013-07-27 MED ORDER — CEFAZOLIN SODIUM-DEXTROSE 2-3 GM-% IV SOLR
2.0000 g | INTRAVENOUS | Status: AC
  Administered 2013-07-27: 2 g via INTRAVENOUS

## 2013-07-27 MED ORDER — PIMECROLIMUS 1 % EX CREA
1.0000 "application " | TOPICAL_CREAM | Freq: Two times a day (BID) | CUTANEOUS | Status: DC
  Filled 2013-07-27 (×15): qty 30

## 2013-07-27 MED ORDER — LORATADINE 10 MG PO TABS
10.0000 mg | ORAL_TABLET | Freq: Every day | ORAL | Status: DC | PRN
  Filled 2013-07-27: qty 1

## 2013-07-27 MED ORDER — FLEET ENEMA 7-19 GM/118ML RE ENEM: 1.0000 | ENEMA | Freq: Once | RECTAL | Status: AC | PRN

## 2013-07-27 MED ORDER — GELATIN ABSORBABLE MT POWD
OROMUCOSAL | Status: DC | PRN
  Administered 2013-07-27 (×2): via TOPICAL

## 2013-07-27 MED ORDER — BUPIVACAINE-EPINEPHRINE PF 0.5-1:200000 % IJ SOLN
INTRAMUSCULAR | Status: AC
  Filled 2013-07-27: qty 30

## 2013-07-27 MED ORDER — MENTHOL 3 MG MT LOZG: 1.0000 | LOZENGE | OROMUCOSAL | Status: DC | PRN

## 2013-07-27 MED ORDER — PANTOPRAZOLE SODIUM 40 MG PO TBEC
40.0000 mg | DELAYED_RELEASE_TABLET | Freq: Every day | ORAL | Status: DC
  Administered 2013-07-27 – 2013-07-28 (×2): 40 mg via ORAL
  Filled 2013-07-27 (×3): qty 1

## 2013-07-27 MED ORDER — CISATRACURIUM BESYLATE 20 MG/10ML IV SOLN
INTRAVENOUS | Status: AC
  Filled 2013-07-27: qty 10

## 2013-07-27 MED ORDER — PROPOFOL 10 MG/ML IV BOLUS
INTRAVENOUS | Status: AC
  Filled 2013-07-27: qty 20

## 2013-07-27 MED ORDER — CISATRACURIUM BESYLATE (PF) 10 MG/5ML IV SOLN
INTRAVENOUS | Status: DC | PRN
  Administered 2013-07-27: 8 mg via INTRAVENOUS

## 2013-07-27 MED ORDER — SODIUM CHLORIDE 0.9 % IJ SOLN
INTRAMUSCULAR | Status: AC
  Filled 2013-07-27: qty 10

## 2013-07-27 MED ORDER — PIMECROLIMUS 1 % EX CREA
1.0000 | TOPICAL_CREAM | Freq: Two times a day (BID) | CUTANEOUS | Status: DC
Start: 2013-07-27 — End: 2013-07-28
  Administered 2013-07-27: 1 via TOPICAL

## 2013-07-27 MED ORDER — SODIUM CHLORIDE 0.9 % IJ SOLN: 3.0000 mL | Freq: Two times a day (BID) | INTRAMUSCULAR | Status: DC

## 2013-07-27 MED ORDER — GABAPENTIN 300 MG PO CAPS: 300.0000 mg | ORAL_CAPSULE | Freq: Three times a day (TID) | ORAL | Status: DC

## 2013-07-27 MED ORDER — PRIMIDONE 250 MG PO TABS
125.0000 mg | ORAL_TABLET | Freq: Three times a day (TID) | ORAL | Status: DC
  Administered 2013-07-27 – 2013-07-28 (×3): 125 mg via ORAL
  Filled 2013-07-27 (×5): qty 1

## 2013-07-27 MED ORDER — POLYETHYLENE GLYCOL 3350 17 G PO PACK: 17.0000 g | PACK | Freq: Every day | ORAL | Status: DC | PRN

## 2013-07-27 MED ORDER — EPHEDRINE SULFATE 50 MG/ML IJ SOLN
INTRAMUSCULAR | Status: AC
  Filled 2013-07-27: qty 1

## 2013-07-27 MED ORDER — ALUM & MAG HYDROXIDE-SIMETH 200-200-20 MG/5ML PO SUSP: 30.0000 mL | Freq: Four times a day (QID) | ORAL | Status: DC | PRN

## 2013-07-27 MED ORDER — BISACODYL 10 MG RE SUPP: 10.0000 mg | Freq: Every day | RECTAL | Status: DC | PRN

## 2013-07-27 MED ORDER — HYDROMORPHONE HCL PF 1 MG/ML IJ SOLN
INTRAMUSCULAR | Status: AC
Start: 2013-07-27 — End: 2013-07-27
  Filled 2013-07-27: qty 1

## 2013-07-27 MED ORDER — LIDOCAINE HCL (CARDIAC) 20 MG/ML IV SOLN
INTRAVENOUS | Status: DC | PRN
  Administered 2013-07-27: 50 mg via INTRAVENOUS

## 2013-07-27 MED ORDER — POLYVINYL ALCOHOL 1.4 % OP SOLN
1.0000 [drp] | Freq: Two times a day (BID) | OPHTHALMIC | Status: DC | PRN
  Filled 2013-07-27: qty 15

## 2013-07-27 MED ORDER — SUFENTANIL CITRATE 50 MCG/ML IV SOLN
INTRAVENOUS | Status: AC
  Filled 2013-07-27: qty 1

## 2013-07-27 MED ORDER — LACTATED RINGERS IV SOLN
INTRAVENOUS | Status: DC
  Administered 2013-07-27 (×2): via INTRAVENOUS

## 2013-07-27 MED ORDER — LISINOPRIL 10 MG PO TABS
10.0000 mg | ORAL_TABLET | Freq: Every day | ORAL | Status: DC | PRN
  Filled 2013-07-27: qty 1

## 2013-07-27 MED ORDER — ONDANSETRON HCL 4 MG/2ML IJ SOLN
INTRAMUSCULAR | Status: AC
  Filled 2013-07-27: qty 2

## 2013-07-27 MED ORDER — METHOCARBAMOL 500 MG PO TABS
500.0000 mg | ORAL_TABLET | Freq: Four times a day (QID) | ORAL | Status: DC | PRN
  Administered 2013-07-27: 500 mg via ORAL
  Filled 2013-07-27: qty 1

## 2013-07-27 MED ORDER — SODIUM CHLORIDE 0.9 % IR SOLN
Status: DC | PRN
  Administered 2013-07-27: 09:00:00

## 2013-07-27 MED ORDER — ACETAMINOPHEN 325 MG PO TABS: 650.0000 mg | ORAL_TABLET | ORAL | Status: DC | PRN

## 2013-07-27 MED ORDER — HYDROCODONE-ACETAMINOPHEN 5-325 MG PO TABS: 1.0000 | ORAL_TABLET | Freq: Four times a day (QID) | ORAL | Status: DC | PRN

## 2013-07-27 MED ORDER — LIDOCAINE HCL (CARDIAC) 20 MG/ML IV SOLN
INTRAVENOUS | Status: AC
  Filled 2013-07-27: qty 5

## 2013-07-27 MED ORDER — HYDROCHLOROTHIAZIDE 10 MG/ML ORAL SUSPENSION
6.2500 mg | Freq: Every day | ORAL | Status: DC | PRN
  Filled 2013-07-27: qty 1.25

## 2013-07-27 MED ORDER — LATANOPROST 0.005 % OP SOLN
1.0000 [drp] | Freq: Every day | OPHTHALMIC | Status: DC
  Administered 2013-07-27: 23:00:00 1 [drp] via OPHTHALMIC
  Filled 2013-07-27: qty 2.5

## 2013-07-27 SURGICAL SUPPLY — 48 items
BAG ZIPLOCK 12X15 (MISCELLANEOUS) IMPLANT
CHLORAPREP W/TINT 26ML (MISCELLANEOUS) IMPLANT
CLEANER TIP ELECTROSURG 2X2 (MISCELLANEOUS) ×3 IMPLANT
CLOSURE WOUND 1/2 X4 (GAUZE/BANDAGES/DRESSINGS) ×1
CLOTH 2% CHLOROHEXIDINE 3PK (PERSONAL CARE ITEMS) ×3 IMPLANT
DECANTER SPIKE VIAL GLASS SM (MISCELLANEOUS) ×3 IMPLANT
DRAPE MICROSCOPE LEICA (MISCELLANEOUS) ×3 IMPLANT
DRAPE POUCH INSTRU U-SHP 10X18 (DRAPES) ×3 IMPLANT
DRAPE SURG 17X11 SM STRL (DRAPES) ×3 IMPLANT
DRAPE UTILITY XL STRL (DRAPES) ×3 IMPLANT
DRSG AQUACEL AG ADV 3.5X 4 (GAUZE/BANDAGES/DRESSINGS) IMPLANT
DRSG AQUACEL AG ADV 3.5X 6 (GAUZE/BANDAGES/DRESSINGS) ×3 IMPLANT
DURAPREP 26ML APPLICATOR (WOUND CARE) ×3 IMPLANT
DURASEAL SPINE SEALANT 3ML (MISCELLANEOUS) IMPLANT
ELECT BLADE TIP CTD 4 INCH (ELECTRODE) IMPLANT
ELECT REM PT RETURN 9FT ADLT (ELECTROSURGICAL) ×3
ELECTRODE REM PT RTRN 9FT ADLT (ELECTROSURGICAL) ×1 IMPLANT
GLOVE BIOGEL PI IND STRL 7.5 (GLOVE) ×1 IMPLANT
GLOVE BIOGEL PI INDICATOR 7.5 (GLOVE) ×2
GLOVE SURG SS PI 7.5 STRL IVOR (GLOVE) ×3 IMPLANT
GLOVE SURG SS PI 8.0 STRL IVOR (GLOVE) ×6 IMPLANT
GOWN STRL REUS W/TWL XL LVL3 (GOWN DISPOSABLE) ×6 IMPLANT
IV CATH 14GX2 1/4 (CATHETERS) ×3 IMPLANT
KIT BASIN OR (CUSTOM PROCEDURE TRAY) ×3 IMPLANT
KIT POSITIONING SURG ANDREWS (MISCELLANEOUS) ×3 IMPLANT
MANIFOLD NEPTUNE II (INSTRUMENTS) ×3 IMPLANT
NEEDLE SPNL 18GX3.5 QUINCKE PK (NEEDLE) ×6 IMPLANT
PATTIES SURGICAL .5 X.5 (GAUZE/BANDAGES/DRESSINGS) IMPLANT
PATTIES SURGICAL .75X.75 (GAUZE/BANDAGES/DRESSINGS) IMPLANT
PATTIES SURGICAL 1X1 (DISPOSABLE) IMPLANT
SPONGE SURGIFOAM ABS GEL 100 (HEMOSTASIS) ×3 IMPLANT
STAPLER VISISTAT (STAPLE) ×3 IMPLANT
STRIP CLOSURE SKIN 1/2X4 (GAUZE/BANDAGES/DRESSINGS) ×2 IMPLANT
SUT NURALON 4 0 TR CR/8 (SUTURE) IMPLANT
SUT PROLENE 3 0 PS 2 (SUTURE) ×3 IMPLANT
SUT VIC AB 0 CT1 27 (SUTURE) ×2
SUT VIC AB 0 CT1 27XBRD ANTBC (SUTURE) ×1 IMPLANT
SUT VIC AB 1 CT1 27 (SUTURE) ×2
SUT VIC AB 1 CT1 27XBRD ANTBC (SUTURE) ×1 IMPLANT
SUT VIC AB 1-0 CT2 27 (SUTURE) ×3 IMPLANT
SUT VIC AB 2-0 CT1 27 (SUTURE)
SUT VIC AB 2-0 CT1 TAPERPNT 27 (SUTURE) IMPLANT
SUT VIC AB 2-0 CT2 27 (SUTURE) ×6 IMPLANT
SYR 3ML LL SCALE MARK (SYRINGE) ×3 IMPLANT
TOWEL OR 17X26 10 PK STRL BLUE (TOWEL DISPOSABLE) ×3 IMPLANT
TOWEL OR NON WOVEN STRL DISP B (DISPOSABLE) IMPLANT
TRAY LAMINECTOMY (CUSTOM PROCEDURE TRAY) ×3 IMPLANT
YANKAUER SUCT BULB TIP NO VENT (SUCTIONS) IMPLANT

## 2013-07-27 NOTE — Anesthesia Postprocedure Evaluation (Signed)
  Anesthesia Post-op Note  Patient: [name]  Procedure(s) Performed: Procedure(s) (LRB): DECOMPRESSION L4 - L5 WITH EXCISION OF SYNOVIAL CYST AND, LATERAL MASS FUSION 1 LEVEL (N/A)  Patient Location: PACU  Anesthesia Type: General  Level of Consciousness: awake and alert   Airway and Oxygen Therapy: Patient Spontanous Breathing  Post-op Pain: mild  Post-op Assessment: Post-op Vital signs reviewed, Patient's Cardiovascular Status Stable, Respiratory Function Stable, Patent Airway and No signs of Nausea or vomiting  Last Vitals:  Filed Vitals:   07/27/13 1200  BP:   Pulse:   Temp: 36.8 C  Resp:     Post-op Vital Signs: stable   Complications: No apparent anesthesia complications

## 2013-07-27 NOTE — Transfer of Care (Signed)
Immediate Anesthesia Transfer of Care Note  Patient: [name]  Procedure(s) Performed: Procedure(s): DECOMPRESSION L4 - L5 WITH EXCISION OF SYNOVIAL CYST AND, LATERAL MASS FUSION 1 LEVEL (N/A)  Patient Location: PACU  Anesthesia Type:General  Level of Consciousness: awake and oriented  Airway & Oxygen Therapy: Patient Spontanous Breathing and Patient connected to face mask oxygen  Post-op Assessment: Report given to PACU RN and Post -op Vital signs reviewed and stable  Post vital signs: Reviewed and stable  Complications: No apparent anesthesia complications

## 2013-07-27 NOTE — Preoperative (Signed)
Beta Blockers   Reason not to administer Beta Blockers:Not Applicable 

## 2013-07-27 NOTE — H&P (View-Only) (Signed)
[name] is an 78 y.o. male.   Chief Complaint: back and leg pain HPI: The patient is a 78 year old male being followed for their low back symptoms. They are now 5 week(s) out from a flare up following a fall on his left side. Symptoms reported today include: pain and leg pain (bilateral R > L). The patient states that they are doing poorly. Current treatment includes: relative rest, activity modification and pain medications. The following medication has been used for pain control: Tylenol or [place] follows up with severe pain into his right leg persistent. No help from the [facility], home exercise program, activity modifications.  He reports he is unable to tolerate his current situation. No fevers, chills, chest pain, or shortness of breath.  Past Medical History  Diagnosis Date  . Other chronic nonalcoholic liver disease   . Diverticulosis of colon (without mention of hemorrhage)   . Gastritis   . Esophagitis   . Abdominal tenderness, epigastric   . Bradycardia   . Chest pain   . Dizziness   . Hepatotoxicity     drug induced  . Bladder cancer   . Cough   . Colonic polyp   . Allergic rhinitis   . Renal insufficiency   . CAD (coronary artery disease)   . CHF (congestive heart failure)   . Lumbar disc disease   . PUD (peptic ulcer disease)   . Hiatal hernia     with reflux  . Rheumatoid arthritis(714.0)   . HTN (hypertension)   . HLD (hyperlipidemia)   . GERD (gastroesophageal reflux disease)   . Depression   . S/P angioplasty   . Tremors of nervous system     Past Surgical History  Procedure Laterality Date  . Adenoidectomy    . Coronary angioplasty      percutaneious transluminal  . Tonsillectomy    . Bladder surgery    . Lumbar disc surgery  1/08  . Transurethral resection of bladder tumor    . Lumbar laminectomy/decompression microdiscectomy  07/09/2011    Procedure: LUMBAR LAMINECTOMY/DECOMPRESSION MICRODISCECTOMY;  Surgeon: Johnn Hai;   Location: [facility];  Service: Orthopedics;  Laterality: Left;  Decompression L5 - S1 on the Left and repair of dura    Family History  Problem Relation Age of Onset  . Heart attack Mother   . Cancer Father     lung  . Cancer Sister     lung  . Diabetes Sister   . Cancer Brother     lung that spread to brain   Social History:  reports that he quit smoking about 27 years ago. His smoking use included Cigarettes. He has a 22.5 pack-year smoking history. He has never used smokeless tobacco. He reports that he does not drink alcohol or use illicit drugs.  Allergies:  Allergies  Allergen Reactions  . Hydrochlorothiazide     REACTION: hyponatremia with daily use  . Sulfonamide Derivatives     REACTION: rash     (Not in a hospital admission)  No results found for this or any previous visit (from the past 48 hour(s)). No results found.  Review of Systems  Constitutional: Negative.   HENT: Negative.   Eyes: Negative.   Respiratory: Negative.   Cardiovascular: Negative.   Gastrointestinal: Negative.   Genitourinary: Negative.   Musculoskeletal: Positive for back pain.  Skin: Negative.   Neurological: Positive for sensory change and focal weakness.  Endo/Heme/Allergies: Negative.   Psychiatric/Behavioral:  Negative.     There were no vitals taken for this visit. Physical Exam  Constitutional: He is oriented to person, place, and time. He appears well-developed and well-nourished.  HENT:  Head: Normocephalic and atraumatic.  Eyes: Conjunctivae and EOM are normal. Pupils are equal, round, and reactive to light.  Neck: Normal range of motion. Neck supple.  Cardiovascular: Normal rate and regular rhythm.   Respiratory: Effort normal and breath sounds normal.  GI: Soft. Bowel sounds are normal.  Musculoskeletal:  On exam he is in severe distress. He walks with an antalgic gait. Mood and affect are appropriate. Straight leg raise produces buttock, thigh, and calf pain on the  right, negative on the left. EHL and dorsiflexion are 4+/5. He has weakness in his gluteus and abductors as well. Slightly altered sensation L5 dermatome. He has no significant pain with extension and flexion of the lumbar spine.  Neurological: He is alert and oriented to person, place, and time. He has normal reflexes.  Skin: Skin is warm and dry.  Psychiatric: He has a normal mood and affect.    MRI indicates facet arthrosis and a large synovial cyst 2x2x1-1/2 centimeters deforming the thecal sac, encroaching on the right S5 nerve root, into the lateral recess. Degenerative changes at 5-1. Degenerative changes at the sacroiliac joint.  He has minimal scoliosis. Good disc space. No instability in flexion and extension.  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 Refractory to rest, activity modifications. Requiring narcotic analgesics.  We discussed options, the severity of his symptoms, and I do not feel that aspiration or epidural is going to be efficacious given the size of this neurocompressive lesion. He is not having ongoing back pain. We discussed decompression and excision of the cyst with possible recurrence of the cyst. Depending upon the portion of the cyst to have removed we may use some bone into the lateral recess for lateral mass fusion. He will undergo his preoperative clearance with Dr. Jenny Reichmann. He has had no chest pain, shortness of breath, fevers, or chills recently. Again, either living with his symptoms or the decompression. Give his pain at this point in time and neurocompressive lesion it is appropriate to proceed. He has had a hemilaminectomy at L5 on the left in the past, decompression at 4-5, removed the residual hemilamina of 5 on the right. Utilize possible allograft, bone graft. He will undergo  preoperative clearance and have him scheduled accordingly.  I had an extensive discussion of the risks and benefits of the lumbar decompression with the patient including bleeding, infection, damage to neurovascular structures, epidural fibrosis, CSF leak requiring repair. We also discussed increase in pain, adjacent segment disease, recurrent disc herniation, need for future surgery including repeat decompression and/or fusion. We also discussed risks of postoperative hematoma, paralysis, anesthetic complications including DVT, PE, death, cardiopulmonary dysfunction. In addition, the perioperative and postoperative courses were discussed in detail including the rehabilitative time and return to functional activity and work. I provided the patient with an illustrated handout and utilized the appropriate surgical models.  Plan microlumbar decompression L4-5 with excision of synovial cyst, possible lateral mass fusion  BISSELL, JACLYN M.  For Dr. Tonita Cong  07/13/2013, 1:18 PM

## 2013-07-27 NOTE — Progress Notes (Signed)
Utilization review completed.  

## 2013-07-27 NOTE — Interval H&P Note (Signed)
History and Physical Interval Note:  07/27/2013 8:30 AM  [name]  has presented today for surgery, with the diagnosis of stenosis and synovial cyst L4 - L5  The various methods of treatment have been discussed with the patient and family. After consideration of risks, benefits and other options for treatment, the patient has consented to  Procedure(s): DECOMPRESSION L4 - L5 WITH EXCISION OF SYNOVIAL CYST AND POSSIBLE LATERAL MASS FUSION 1 LEVEL (N/A) as a surgical intervention .  The patient's history has been reviewed, patient examined, no change in status, stable for surgery.  I have reviewed the patient's chart and labs.  Questions were answered to the patient's satisfaction.     [name] C

## 2013-07-27 NOTE — Plan of Care (Signed)
Problem: Consults Goal: Spinal Surgery Patient Education See Patient Education Module for education specifics. lum decompression laminectomy  Goal: Diagnosis - Spinal Surgery Lumbar laminectomy

## 2013-07-27 NOTE — Brief Op Note (Signed)
07/27/2013  10:22 AM  PATIENT:  [name]  78 y.o. male  PRE-OPERATIVE DIAGNOSIS:  stenosis and synovial cyst L4 - L5  POST-OPERATIVE DIAGNOSIS:  stenosis and synovial cyst L4 - L5  PROCEDURE:  Procedure(s): DECOMPRESSION L4 - L5 WITH EXCISION OF SYNOVIAL CYST AND POSSIBLE LATERAL MASS FUSION 1 LEVEL (N/A)  SURGEON:  Surgeon(s) and Role:    * Johnn Hai, MD - Primary  PHYSICIAN ASSISTANT:   ASSISTANTS: Bissell   ANESTHESIA:   general  EBL:  Total I/O In: 1000 [I.V.:1000] Out: -   BLOOD ADMINISTERED:none  DRAINS: none   LOCAL MEDICATIONS USED:  MARCAINE     SPECIMEN:  Source of Specimen:  L45 cyst  DISPOSITION OF SPECIMEN:  PATHOLOGY  COUNTS:  YES  TOURNIQUET:  * No tourniquets in log *  DICTATION: .Other Dictation: Dictation Number [id number]  PLAN OF CARE: Admit to inpatient   PATIENT DISPOSITION:  PACU - hemodynamically stable.   Delay start of Pharmacological VTE agent (>24hrs) due to surgical blood loss or risk of bleeding: yes

## 2013-07-27 NOTE — Anesthesia Preprocedure Evaluation (Addendum)
Anesthesia Evaluation  Patient identified by MRN, date of birth, ID band Patient awake    Reviewed: Allergy & Precautions, H&P , NPO status , Patient's Chart, lab work & pertinent test results  Airway Mallampati: II TM Distance: >3 FB Neck ROM: full    Dental  (+) Edentulous Upper and Edentulous Lower   Pulmonary neg pulmonary ROS, former smoker,  breath sounds clear to auscultation  Pulmonary exam normal       Cardiovascular hypertension, Pt. on medications + CAD and +CHF Rhythm:regular Rate:Normal  Vaso vagal syncope. Coronary angioplasty   Neuro/Psych negative neurological ROS  negative psych ROS   GI/Hepatic negative GI ROS, Neg liver ROS, hiatal hernia, GERD-  Medicated and Controlled,  Endo/Other  negative endocrine ROS  Renal/GU negative Renal ROS  negative genitourinary   Musculoskeletal  (+) Arthritis -, Rheumatoid disorders,    Abdominal   Peds  Hematology negative hematology ROS (+)   Anesthesia Other Findings   Reproductive/Obstetrics negative OB ROS                          Anesthesia Physical Anesthesia Plan  ASA: III  Anesthesia Plan: General   Post-op Pain Management:    Induction: Intravenous  Airway Management Planned: Oral ETT  Additional Equipment:   Intra-op Plan:   Post-operative Plan: Extubation in OR  Informed Consent: I have reviewed the patients History and Physical, chart, labs and discussed the procedure including the risks, benefits and alternatives for the proposed anesthesia with the patient or authorized representative who has indicated his/her understanding and acceptance.   Dental Advisory Given  Plan Discussed with: CRNA and Surgeon  Anesthesia Plan Comments:         Anesthesia Quick Evaluation

## 2013-07-27 NOTE — Discharge Instructions (Signed)
Walk As Tolerated utilizing back precautions.  No bending, twisting, or lifting.  No driving for 2 weeks.   °Aquacel dressing may remain in place for 7 days. May shower with aquacel dressing in place. After 7 days, remove aquacel dressing and place gauze and tape dressing which should be kept clean and dry and changed daily. °See Dr. Beane in office in 10 to 14 days. Begin taking aspirin 81mg per day starting 4 days after your surgery if not allergic to aspirin or on another blood thinner. °Walk daily even outside. Use a cane or walker only if necessary. °Avoid sitting on soft sofas. ° °

## 2013-07-27 NOTE — Anesthesia Procedure Notes (Signed)
Procedure Name: Intubation Date/Time: 07/27/2013 8:41 AM Performed by: Danley Danker L Patient Re-evaluated:Patient Re-evaluated prior to inductionOxygen Delivery Method: Circle system utilized Preoxygenation: Pre-oxygenation with 100% oxygen Intubation Type: IV induction Ventilation: Mask ventilation without difficulty and Oral airway inserted - appropriate to patient size Grade View: Grade I Tube type: Oral Tube size: 8.0 mm Number of attempts: 1 Airway Equipment and Method: Stylet Placement Confirmation: ETT inserted through vocal cords under direct vision,  breath sounds checked- equal and bilateral and positive ETCO2 Secured at: 22 cm Tube secured with: Tape Dental Injury: Teeth and Oropharynx as per pre-operative assessment

## 2013-07-28 ENCOUNTER — Encounter (HOSPITAL_COMMUNITY): Payer: Self-pay | Admitting: Specialist

## 2013-07-28 LAB — BASIC METABOLIC PANEL
BUN: 12 mg/dL (ref 6–23)
CHLORIDE: 96 meq/L (ref 96–112)
CO2: 28 mEq/L (ref 19–32)
Calcium: 8.5 mg/dL (ref 8.4–10.5)
Creatinine, Ser: 0.93 mg/dL (ref 0.50–1.35)
GFR calc non Af Amer: 80 mL/min — ABNORMAL LOW (ref >90)
Glucose, Bld: 102 mg/dL — ABNORMAL HIGH (ref 70–99)
Potassium: 4 mEq/L (ref 3.7–5.3)
SODIUM: 135 meq/L — AB (ref 137–147)

## 2013-07-28 LAB — CBC
HCT: 38.8 % — ABNORMAL LOW (ref 39.0–52.0)
Hemoglobin: 13.4 g/dL (ref 13.0–17.0)
MCH: 29.9 pg (ref 26.0–34.0)
MCHC: 34.5 g/dL (ref 30.0–36.0)
MCV: 86.6 fL (ref 78.0–100.0)
PLATELETS: 172 10*3/uL (ref 150–400)
RBC: 4.48 MIL/uL (ref 4.22–5.81)
RDW: 12.6 % (ref 11.5–15.5)
WBC: 9.3 10*3/uL (ref 4.0–10.5)

## 2013-07-28 NOTE — Progress Notes (Signed)
CSW consulted for SNF placement. PN reviewed. Pt plans to return home following hospital d/c. No PT follow up needed.  Werner Lean LCSW [phone]

## 2013-07-28 NOTE — Discharge Summary (Signed)
Physician Discharge Summary   Patient ID: [name] MRN: [id number] DOB/AGE: Mar 08, 1936 78 y.o.  Admit date: 07/27/2013 Discharge date: 07/28/2013  Primary Diagnosis:   stenosis and synovial cyst L4 - L5  Admission Diagnoses:  Past Medical History  Diagnosis Date  . Other chronic nonalcoholic liver disease   . Diverticulosis of colon (without mention of hemorrhage)   . Gastritis   . Esophagitis   . Abdominal tenderness, epigastric   . Bradycardia   . Chest pain   . Dizziness   . Hepatotoxicity     drug induced  . Bladder cancer   . Cough   . Colonic polyp   . Allergic rhinitis   . Renal insufficiency   . CAD (coronary artery disease)   . CHF (congestive heart failure)   . Lumbar disc disease   . PUD (peptic ulcer disease)   . Hiatal hernia     with reflux  . Rheumatoid arthritis(714.0)   . HTN (hypertension)   . HLD (hyperlipidemia)   . GERD (gastroesophageal reflux disease)   . Depression   . S/P angioplasty   . Tremors of nervous system    Discharge Diagnoses:   Active Problems:   Spinal stenosis, lumbar  Procedure:  Procedure(s) (LRB): DECOMPRESSION L4 - L5 WITH EXCISION OF SYNOVIAL CYST AND, LATERAL MASS FUSION 1 LEVEL (N/A)   Consults: None  HPI:  see H&P    Laboratory Data: Hospital Outpatient Visit on 07/25/2013  Component Date Value Range Status  . MRSA, PCR 07/25/2013 NEGATIVE  NEGATIVE Final  . Staphylococcus aureus 07/25/2013 POSITIVE* NEGATIVE Final   Comment:                                 The Xpert SA Assay (FDA                          approved for NASAL specimens                          in patients over 74 years of age),                          is one component of                          a comprehensive surveillance                          program.  Test performance has                          been validated by [employer] for patients greater                          than or equal to 4 year old.                            It is not intended  to diagnose infection nor to                          guide or monitor treatment.  . ABO/RH(D) 07/25/2013 AB POS   Final  . Antibody Screen 07/25/2013 NEG   Final  . Sample Expiration 07/25/2013 07/30/2013   Final  . Sodium 07/25/2013 136* 137 - 147 mEq/L Final  . Potassium 07/25/2013 3.7  3.7 - 5.3 mEq/L Final  . Chloride 07/25/2013 95* 96 - 112 mEq/L Final  . CO2 07/25/2013 28  19 - 32 mEq/L Final  . Glucose, Bld 07/25/2013 108* 70 - 99 mg/dL Final  . BUN 07/25/2013 12  6 - 23 mg/dL Final  . Creatinine, Ser 07/25/2013 0.92  0.50 - 1.35 mg/dL Final  . Calcium 07/25/2013 9.3  8.4 - 10.5 mg/dL Final  . GFR calc non Af Amer 07/25/2013 80* >90 mL/min Final  . GFR calc Af Amer 07/25/2013 >90  >90 mL/min Final   Comment: (NOTE)                          The eGFR has been calculated using the CKD EPI equation.                          This calculation has not been validated in all clinical situations.                          eGFR's persistently <90 mL/min signify possible Chronic Kidney                          Disease.  . WBC 07/25/2013 6.4  4.0 - 10.5 K/uL Final  . RBC 07/25/2013 5.11  4.22 - 5.81 MIL/uL Final  . Hemoglobin 07/25/2013 15.6  13.0 - 17.0 g/dL Final  . HCT 07/25/2013 44.2  39.0 - 52.0 % Final  . MCV 07/25/2013 86.5  78.0 - 100.0 fL Final  . MCH 07/25/2013 30.5  26.0 - 34.0 pg Final  . MCHC 07/25/2013 35.3  30.0 - 36.0 g/dL Final  . RDW 07/25/2013 12.5  11.5 - 15.5 % Final  . Platelets 07/25/2013 221  150 - 400 K/uL Final  . ABO/RH(D) 07/25/2013 AB POS   Final    Recent Labs  07/25/13 1355 07/28/13 0540  HGB 15.6 13.4    Recent Labs  07/25/13 1355 07/28/13 0540  WBC 6.4 9.3  RBC 5.11 4.48  HCT 44.2 38.8*  PLT 221 172    Recent Labs  07/25/13 1355 07/28/13 0540  NA 136* 135*  K 3.7 4.0  CL 95* 96  CO2 28 28  BUN 12 12  CREATININE 0.92 0.93  GLUCOSE 108* 102*  CALCIUM 9.3 8.5    No results found for this basename: LABPT, INR,  in the last 72 hours  X-Rays:Dg Lumbar Spine 2-3 Views  07/25/2013   CLINICAL DATA:  Low back pain  EXAM: LUMBAR SPINE - 2-3 VIEW  COMPARISON:  Portable lateral view dated July 09, 2011  FINDINGS: The lumbar vertebral bodies are preserved in height. The intervertebral disc space heights are well maintained. There is no pars defect nor spondylolisthesis. Mild facet joint hypertrophy at L4-5 and at L5-S1 is demonstrated. The pedicles and transverse processes appear intact. The observed portions of the sacrum and SI joints are within the  limits of normal for age. There is calcification in the wall of the abdominal aorta.  IMPRESSION: There is no acute bony abnormality of the lumbar spine. There is mild degenerative facet joint change at L4-5 and at L5-S1.   Electronically Signed   By: David  Martinique   On: 07/25/2013 14:50   Dg Spine Portable 1 View  07/27/2013   CLINICAL DATA:  Surgical decompression L4-5  EXAM: PORTABLE SPINE - 1 VIEW  COMPARISON:  07/27/2013  FINDINGS: The lowest disc space is L5-S1 consistent with numbering from earlier today.  Tissue spreaders are present posterior to L4-5. Surgical instruments outline the L4-5 disc space.  IMPRESSION: L4-5 disc space is localized.   Electronically Signed   By: Franchot Gallo M.D.   On: 07/27/2013 10:01   Dg Spine Portable 1 View  07/27/2013   CLINICAL DATA:  78 year old male undergoing lumbar surgery. Initial encounter.  EXAM: PORTABLE SPINE - 1 VIEW  COMPARISON:  07/25/2013 and earlier.  FINDINGS: Normal lumbar segmentation depicted on 07/25/2013. Same numbering system used today.  Intraoperative portable cross-table lateral view of the lumbar spine labeled #1 at 098 hrs. Cephalad clamp on the L4 spinous process. Caudal clamp all on or just below the L5 spinous process.  Aortoiliac calcified atherosclerosis noted.  IMPRESSION: Intraoperative localization as above.   Electronically Signed   By: Lars Pinks M.D.   On: 07/27/2013 09:15    EKG: Orders placed during the hospital encounter of 01/06/13  . EKG 12-LEAD  . EKG 12-LEAD  . ED EKG  . ED EKG  . EKG 12-LEAD  . EKG 12-LEAD  . EKG     Hospital Course: Patient was admitted to [facility] and taken to the OR and underwent the above state procedure without complications.  Patient tolerated the procedure well and was later transferred to the recovery room and then to the orthopaedic floor for postoperative care.  They were given PO and IV analgesics for pain control following their surgery.  They were given 24 hours of postoperative antibiotics.   PT was consulted postop to assist with mobility and transfers.  The patient was allowed to be WBAT with therapy and was taught back precautions. Discharge planning was consulted to help with postop disposition and equipment needs.  Patient had a good night on the evening of surgery and started to get up OOB with therapy on day one. Patient was seen in rounds and was ready to go home on day one.  They were given discharge instructions and dressing directions.  They were instructed on when to follow up in the office with Dr. Tonita Cong.  Discharge Medications: Prior to Admission medications   Medication Sig Start Date End Date Taking? Authorizing Provider  acetaminophen-codeine (TYLENOL #3) 300-30 MG per tablet Take 1 tablet by mouth 2 (two) times daily as needed for moderate pain or severe pain. Pain    Yes Historical Provider, MD  amLODipine (NORVASC) 5 MG tablet Take 5 mg by mouth every morning. 09/10/12  Yes Biagio Borg, MD  Ascorbic Acid (VITAMIN C) 500 MG tablet Take 500 mg by mouth 2 (two) times daily.    Yes Historical Provider, MD  cetirizine (ZYRTEC) 10 MG tablet Take 10 mg by mouth daily as needed for allergies. As needed 10/28/12  Yes Biagio Borg, MD  citalopram (CELEXA) 10 MG tablet Take 10 mg by mouth every morning. 07/18/13  Yes Biagio Borg, MD  docusate sodium (COLACE) 100 MG  capsule  Take 100 mg by mouth 2 (two) times daily.   Yes Historical Provider, MD  fluticasone (FLONASE) 50 MCG/ACT nasal spray Place 2 sprays into the nose daily as needed for rhinitis. Allergies 10/28/12  Yes Biagio Borg, MD  gabapentin (NEURONTIN) 300 MG capsule Take 300-600 mg by mouth 4 (four) times daily - after meals and at bedtime. 02/15/13  Yes Biagio Borg, MD  hydrochlorothiazide (HYDRODIURIL) 25 MG tablet Take 6.25 mg by mouth daily as needed (Takes only if BP > 180/90).   Yes Historical Provider, MD  lisinopril (PRINIVIL,ZESTRIL) 10 MG tablet Take 10 mg by mouth daily as needed (Takes only when BP >180/90).   Yes Historical Provider, MD  methylcellulose (ARTIFICIAL TEARS) 1 % ophthalmic solution Place 1 drop into both eyes 2 (two) times daily as needed (dry eyes).    Yes Historical Provider, MD  omeprazole (PRILOSEC) 20 MG capsule Take 20 mg by mouth 2 (two) times daily.   Yes Historical Provider, MD  pimecrolimus (ELIDEL) 1 % cream Apply 1 application topically 2 (two) times daily.   Yes Historical Provider, MD  primidone (MYSOLINE) 250 MG tablet Take 125 mg by mouth 3 (three) times daily. 09/10/12  Yes Biagio Borg, MD  ranitidine (ZANTAC) 300 MG capsule Take 1 capsule (300 mg total) by mouth every evening. 08/26/11  Yes Biagio Borg, MD  simvastatin (ZOCOR) 40 MG tablet Take 60 mg by mouth at bedtime. 06/08/13  Yes Biagio Borg, MD  tamsulosin (FLOMAX) 0.4 MG CAPS capsule Take 0.4 mg by mouth daily. 07/18/13  Yes Biagio Borg, MD  travoprost, benzalkonium, (TRAVATAN) 0.004 % ophthalmic solution Place 1 drop into both eyes at bedtime. 08/26/11  Yes Biagio Borg, MD  docusate sodium (COLACE) 100 MG capsule Take 1 capsule (100 mg total) by mouth 2 (two) times daily. 07/27/13   Conley Rolls. , PA-C  HYDROcodone-acetaminophen (NORCO) 5-325 MG per tablet Take 1-2 tablets by mouth every 6 (six) hours as needed for moderate pain or severe pain. 07/27/13   Conley Rolls. , PA-C  hydrocortisone  (ANUSOL-HC) 25 MG suppository Place 25 mg rectally 2 (two) times daily as needed for hemorrhoids. Itching 04/19/12   Biagio Borg, MD  nitroGLYCERIN (NITROSTAT) 0.4 MG SL tablet Place 1 tablet (0.4 mg total) under the tongue every 5 (five) minutes as needed. Chest pain 08/26/11   Biagio Borg, MD    Diet: low sodium heart healthy Activity:WBAT; Lspine precautions Follow-up:in 10-14 days Disposition - Home Discharged Condition: good   Discharge Orders   Future Orders Complete By Expires   Call MD / Call 911  As directed    Comments:     If you experience chest pain or shortness of breath, CALL 911 and be transported to the hospital emergency room.  If you develope a fever above 101 F, pus (white drainage) or increased drainage or redness at the wound, or calf pain, call your surgeon's office.   Constipation Prevention  As directed    Comments:     Drink plenty of fluids.  Prune juice may be helpful.  You may use a stool softener, such as Colace (over the counter) 100 mg twice a day.  Use MiraLax (over the counter) for constipation as needed.   Diet - low sodium heart healthy  As directed    Increase activity slowly as tolerated  As directed        Medication List    STOP taking these medications  acetaminophen-codeine 300-30 MG per tablet  Commonly known as:  TYLENOL #3      TAKE these medications       amLODipine 5 MG tablet  Commonly known as:  NORVASC  Take 5 mg by mouth every morning.     cetirizine 10 MG tablet  Commonly known as:  ZYRTEC  Take 10 mg by mouth daily as needed for allergies. As needed     citalopram 10 MG tablet  Commonly known as:  CELEXA  Take 10 mg by mouth every morning.     docusate sodium 100 MG capsule  Commonly known as:  COLACE  Take 1 capsule (100 mg total) by mouth 2 (two) times daily.     fluticasone 50 MCG/ACT nasal spray  Commonly known as:  FLONASE  Place 2 sprays into the nose daily as needed for rhinitis. Allergies      gabapentin 300 MG capsule  Commonly known as:  NEURONTIN  Take 300-600 mg by mouth 4 (four) times daily - after meals and at bedtime.     hydrochlorothiazide 25 MG tablet  Commonly known as:  HYDRODIURIL  Take 6.25 mg by mouth daily as needed (Takes only if BP > 180/90).     HYDROcodone-acetaminophen 5-325 MG per tablet  Commonly known as:  NORCO  Take 1-2 tablets by mouth every 6 (six) hours as needed for moderate pain or severe pain.     hydrocortisone 25 MG suppository  Commonly known as:  ANUSOL-HC  Place 25 mg rectally 2 (two) times daily as needed for hemorrhoids. Itching     lisinopril 10 MG tablet  Commonly known as:  PRINIVIL,ZESTRIL  Take 10 mg by mouth daily as needed (Takes only when BP >180/90).     methylcellulose 1 % ophthalmic solution  Commonly known as:  ARTIFICIAL TEARS  Place 1 drop into both eyes 2 (two) times daily as needed (dry eyes).     nitroGLYCERIN 0.4 MG SL tablet  Commonly known as:  NITROSTAT  Place 1 tablet (0.4 mg total) under the tongue every 5 (five) minutes as needed. Chest pain     omeprazole 20 MG capsule  Commonly known as:  PRILOSEC  Take 20 mg by mouth 2 (two) times daily.     pimecrolimus 1 % cream  Commonly known as:  ELIDEL  Apply 1 application topically 2 (two) times daily.     primidone 250 MG tablet  Commonly known as:  MYSOLINE  Take 125 mg by mouth 3 (three) times daily.     ranitidine 300 MG capsule  Commonly known as:  ZANTAC  Take 1 capsule (300 mg total) by mouth every evening.     simvastatin 40 MG tablet  Commonly known as:  ZOCOR  Take 60 mg by mouth at bedtime.     tamsulosin 0.4 MG Caps capsule  Commonly known as:  FLOMAX  Take 0.4 mg by mouth daily.     travoprost (benzalkonium) 0.004 % ophthalmic solution  Commonly known as:  TRAVATAN  Place 1 drop into both eyes at bedtime.     vitamin C 500 MG tablet  Commonly known as:  ASCORBIC ACID  Take 500 mg by mouth 2 (two) times daily.             Follow-up Information   Follow up with BEANE,JEFFREY C, MD In 2 weeks.   Specialty:  Orthopedic Surgery   Contact information:   [address] [place] [address] [phone]  SignedCecilie Kicks. 07/28/2013, 10:31 AM

## 2013-07-28 NOTE — Care Management Note (Signed)
    Page 1 of 1   07/28/2013     6:55:51 PM   CARE MANAGEMENT NOTE 07/28/2013  Patient:  [name], [name]   Account Number:  [id number]  Date Initiated:  07/28/2013  Documentation initiated by:  Sherrin Daisy  Subjective/Objective Assessment:   dx lumbar stenosis, synovial cyst L4-5; redo decompression L4-5 central laminectomy, cyst removal     Action/Plan:   CM spoke with patient & spouse. Plans are for return to home where spouse will be caregiver. He already has RW if needed. No HH services are needed.   Anticipated DC Date:  07/28/2013   Anticipated DC Plan:  HOME/SELF CARE  In-house referral  Clinical Social Worker      DC Planning Services  CM consult      Choice offered to / List presented to:             Status of service:  Completed, signed off Medicare Important Message given?  NA - LOS <3 / Initial given by admissions (If response is "NO", the following Medicare IM given date fields will be blank) Date Medicare IM given:   Date Additional Medicare IM given:    Discharge Disposition:  HOME/SELF CARE  Per UR Regulation:    If discussed at Long Length of Stay Meetings, dates discussed:    Comments:

## 2013-07-28 NOTE — Op Note (Signed)
[name], [name]                  ACCOUNT NO.:  [id number]  MEDICAL RECORD NO.:  [id number]  LOCATION:  2952                         FACILITY:  [facility]  PHYSICIAN:  Susa Day, M.D.    DATE OF BIRTH:  07-May-1936  DATE OF PROCEDURE:  07/27/2013 DATE OF DISCHARGE:                              OPERATIVE REPORT   PREOPERATIVE DIAGNOSIS: 1. Spinal stenosis at L4-5. 2. Synovial cyst at L4-5.  POSTOPERATIVE DIAGNOSES: 1. Spinal stenosis at L4-5. 2. Synovial cyst at L4-5.  PROCEDURE PERFORMED: 1. Redo decompression at L4-5 with central laminectomy. 2. Medial hemifacetectomy, right. 3. Excision of synovial cysts at L4-5. 4. Lateral mass fusion utilizing autologous cancellous bone, L4-5     right. 5. Foraminotomies of L4 and L5.  ANESTHESIA:  General.  ASSISTANT:  Cleophas Dunker, PA.  HISTORY:  This is a 78 year old with severe lower extremity radicular pain secondary to large synovial cyst compressing thecal sac L4-L5 roots with facet hypertrophy, myotomal weakness, dermatomal dysesthesias, is indicated for decompression of L4-5 roots.  Previous decompression at L4- 5 and L5-1 on the left.  He did have some radicular pain in the lower extremities indicated for central decompression L4-5, excision of synovial cyst, partial medial hemifacetectomy and placement of allograft in situ, fusion lateral mass to reduce the risk of instability in the future.  TECHNIQUE:  With the patient in supine position, after induction of adequate general anesthesia, a 2 g Kefzol and 900 clinda, he was placed prone on the [place] frame.  All bony prominences were well padded. Lumbar region was prepped and draped in the usual sterile fashion. Previous surgical incision was utilized extending slightly cephalad and subcutaneous tissue was dissected.  Electrocautery was utilized to achieve hemostasis.  We placed Marcaine with epinephrine and subcutaneous tissue.  Dorsolumbar fascia was identified  and divided in line with skin incision.  Paraspinous muscle was elevated from lamina of L4-5 bilaterally.  Spinous processes of L4 and 5 were identified with a Kocher and confirmed with x-ray.  McCullough retractor was placed. Operating microscope was draped and brought into the surgical field.  We skeletonized the spinous processes of L4 and L5.  They were removed with Leksell rongeur, morselized for autologous cancellous bone.  Using curette, detached ligamentum flavum and epidural fibrosis from the cephalad edge of L4.  Ligamentum flavum was removed from the interspace. We mobilized the thecal sac centrally into the left from a previous hemilaminotomy.  We identified the facet on the right as well and the lamina of L4 cephalad.  We performed hemilaminotomy of the caudad edge of L4.  Foraminotomy of L5 was performed.  The large synovial cyst was noted compressing thecal sac at L4 and 5 roots.  There was a gelatinous fluid that was evacuated from the central portion of the cyst, sent to Pathology.  This was then curetted.  The cyst contents were removed. There was a portion of the lateral wall of the cyst that was adhesed to the thecal sac that we left intact.  We excised the remainder of the cyst, this required partial medial hemifacetectomy and a generous foraminotomies of L4 and 5.  We identified the L4-5 root.  There was epidural venous plexus, was cauterized as well.  After decompressing the thecal sac and removing the cyst and partial medial hemifacetectomy, we copiously irrigated the wound.  Woodson retractor was placed in the foramen of L4 and 5, confirmed by x-ray.  We used electrocautery to cauterize any synovium remained within the remaining facet.  Hockey- stick probe passed freely above the pedicle L4 and below the pedicle of L5.  We identified the transverse process of L4 and L5 and over the edge of the facet.  After curetting the facet, we packed the cancellous bone. We  had placed thrombin-soaked Gelfoam in the laminotomy defect and the defect created by the cyst.  There was no evidence of CSF leakage or active bleeding.  The cyst material was sent to pathology.  We removed the [facility] retractor.  No evidence of active bleeding.  Thrombin- soaked Gelfoam again was placed in laminotomy defect.  We repaired the dorsolumbar fascia with #1 Vicryl interrupted figure-of-8 sutures, subcu with 2-0, and skin with staples.  Copious irrigation throughout multiple layers.  Wound was dressed sterilely.  Placed supine on hospital bed, extubated without difficulty, and transported to the recovery room in satisfactory condition.  The patient tolerated the procedure well with no complications.     Susa Day, M.D.     Geralynn Rile  D:  07/27/2013  T:  07/28/2013  Job:  [id number]

## 2013-07-28 NOTE — Evaluation (Signed)
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    PT Assessment  Patent does not need any further PT services    Follow Up Recommendations  No PT follow up    Does the patient have the potential to tolerate intense rehabilitation      Barriers to Discharge        Equipment Recommendations  None recommended by PT    Recommendations for Other Services     Frequency      Precautions / Restrictions Precautions Precautions: Back Precaution Booklet Issued: Yes (comment) Precaution Comments: Issued per OT  and reviewed with pt/spouse   Pertinent Vitals/Pain Pain controlled     Mobility  Bed Mobility Overal bed mobility: Needs Assistance Bed Mobility: Sit to Sidelying Sit to sidelying: Min guard General bed mobility comments: cues for log roll Transfers Overall transfer level: Needs assistance Equipment used: Rolling walker (2 wheeled) Transfers: Sit to/from Stand Sit to Stand: Supervision General transfer comment: min verbal cues for back precautions. Ambulation/Gait Ambulation/Gait assistance: Supervision Ambulation Distance (Feet): 200 Feet Assistive device: Rolling walker (2 wheeled) Gait Pattern/deviations: Step-through pattern General Gait Details: guarded amb    Exercises     PT Diagnosis:    PT Problem List:   PT Treatment Interventions:       PT Goals(Current goals can be found in the care plan section) Acute Rehab PT Goals Patient Stated Goal: for both himself and wife to be well.  PT Goal Formulation: No goals set, d/c  therapy  Visit Information  Last PT Received On: 07/28/13 Assistance Needed: +1 History of Present Illness: DECOMPRESSION L4 - L5 WITH EXCISION OF SYNOVIAL CYST AND, LATERAL MASS FUSION 1 LEVEL       Prior Functioning  Home Living Family/patient expects to be discharged to:: Private residence Living Arrangements: Spouse/significant other Available Help at Discharge: Family Type of Home: House Home Access:  (one small 4" step/threshold) Home Layout: One level Home Equipment: Walker - 2 wheels;Bedside commode;Shower seat Prior Function Level of Independence: Independent Comments: buthaving pain with activity prior to surgery Communication Communication: No difficulties    Cognition  Cognition Arousal/Alertness: Awake/alert Behavior During Therapy: WFL for tasks assessed/performed Overall Cognitive Status: Within Functional Limits for tasks assessed    Extremity/Trunk Assessment Upper Extremity Assessment Upper Extremity Assessment: Overall WFL for tasks assessed Lower Extremity Assessment Lower Extremity Assessment: Overall WFL for tasks assessed   Balance    End of Session PT - End of Session Activity Tolerance: Patient tolerated treatment well Patient left: in bed;with call bell/phone within reach;with family/visitor present Nurse Communication: Mobility status  GP     [facility] 07/28/2013, 11:05 AM

## 2013-07-28 NOTE — Progress Notes (Signed)
Subjective: 1 Day Post-Op Procedure(s) (LRB): DECOMPRESSION L4 - L5 WITH EXCISION OF SYNOVIAL CYST AND, LATERAL MASS FUSION 1 LEVEL (N/A) Patient reports pain as mild.  Seen in AM rounds for Dr. Tonita Cong. Reports he is doing well. Leg pain is resolved. He does note some incisional tenderness. Foley is still in place. Reports urinary retention in the past post-operatively. No other c/o this AM.  Objective: Vital signs in last 24 hours: Temp:  [97.7 F (36.5 C)-98.8 F (37.1 C)] 97.9 F (36.6 C) (01/15 0631) Pulse Rate:  [56-76] 56 (01/15 0631) Resp:  [11-18] 18 (01/15 0631) BP: (108-144)/(56-86) 117/67 mmHg (01/15 0631) SpO2:  [95 %-100 %] 98 % (01/15 0631) Weight:  [87.998 kg (194 lb)] 87.998 kg (194 lb) (01/14 1245)  Intake/Output from previous day: 01/14 0701 - 01/15 0700 In: 2935 [P.O.:840; I.V.:2095] Out: 3350 [Urine:3275; Blood:75] Intake/Output this shift:     Recent Labs  07/25/13 1355 07/28/13 0540  HGB 15.6 13.4    Recent Labs  07/25/13 1355 07/28/13 0540  WBC 6.4 9.3  RBC 5.11 4.48  HCT 44.2 38.8*  PLT 221 172    Recent Labs  07/25/13 1355 07/28/13 0540  NA 136* 135*  K 3.7 4.0  CL 95* 96  CO2 28 28  BUN 12 12  CREATININE 0.92 0.93  GLUCOSE 108* 102*  CALCIUM 9.3 8.5   No results found for this basename: LABPT, INR,  in the last 72 hours  Neurologically intact ABD soft Neurovascular intact Sensation intact distally Intact pulses distally Dorsiflexion/Plantar flexion intact Incision: dressing C/D/I and no drainage No cellulitis present Compartment soft no calf pain or sign of DVT  Assessment/Plan: 1 Day Post-Op Procedure(s) (LRB): DECOMPRESSION L4 - L5 WITH EXCISION OF SYNOVIAL CYST AND, LATERAL MASS FUSION 1 LEVEL (N/A) Advance diet Up with therapy D/C IV fluids Discussed D/C instructions, Lspine precautions D/C foley this AM PT for ambulation Plan D/C later today as long as he is voiding without difficulty and pain remains well  controlled Discussed with Dr. Mliss Fritz, [place] Rolls. 07/28/2013, 7:38 AM

## 2013-07-28 NOTE — Evaluation (Signed)
Occupational Therapy Evaluation Patient Details Name: [name] MRN: [id number] DOB: May 31, 1936 Today's Date: 07/28/2013 Time: 2951-8841 OT Time Calculation (min): 22 min  OT Assessment / Plan / Recommendation History of present illness DECOMPRESSION L4 - L5 WITH EXCISION OF SYNOVIAL CYST AND, LATERAL MASS FUSION 1 LEVEL   Clinical Impression   Pt doing very well and is familiar with back care education from previous back surgery. Wife present and can assist PRN at home. All education completed for OT. No further OT needs.    OT Assessment  Patient does not need any further OT services    Follow Up Recommendations  No OT follow up;Supervision - Intermittent    Barriers to Discharge      Equipment Recommendations  None recommended by OT    Recommendations for Other Services    Frequency       Precautions / Restrictions Precautions Precautions: Back Precaution Booklet Issued: Yes (comment) Precaution Comments: Issued to pt and reviewed with pt/spouse   Pertinent Vitals/Pain 4-5/10 back ; informed nursing; reposition, rest    ADL  Eating/Feeding: Simulated;Independent Where Assessed - Eating/Feeding: Chair Grooming: Simulated;Wash/dry hands;Set up Where Assessed - Grooming: Supported sitting Upper Body Bathing: Simulated;Chest;Right arm;Left arm;Abdomen;Set up Where Assessed - Upper Body Bathing: Unsupported sitting Lower Body Bathing: Simulated;Minimal assistance;Moderate assistance Where Assessed - Lower Body Bathing: Supported sit to stand Upper Body Dressing: Simulated;Supervision/safety Where Assessed - Upper Body Dressing: Unsupported sitting Lower Body Dressing: Simulated;Moderate assistance (without AE to start clothing over LEs) Where Assessed - Lower Body Dressing: Supported sit to stand Toilet Transfer: Performed;Supervision/safety (min verbal cues to slide forward on 3in1 before standing) Toilet Transfer Equipment: Raised toilet seat with arms (or 3-in-1  over toilet) Toileting - Clothing Manipulation and Hygiene: Simulated;Supervision/safety Where Assessed - Toileting Clothing Manipulation and Hygiene: Sit to stand from 3-in-1 or toilet Tub/Shower Transfer: Performed;Supervision/safety Equipment Used: Rolling walker;Long-handled sponge;Reacher;Sock aid ADL Comments: Pt has a Secondary school teacher and practiced using it to doff L sock and sock aid to don sock. Educated on where to obtain sock aid if interested. He has all other AE. He states wife can also assist wtih LB self care. He was not able to cross LEs up before surgery. Reviewed back care handout with pt and spouse. Educated on [place] for safety and shower seat and having supervision for safety.     OT Diagnosis:    OT Problem List:   OT Treatment Interventions:     OT Goals(Current goals can be found in the care plan section) Acute Rehab OT Goals Patient Stated Goal: for both himself and wife to be well.   Visit Information  Last OT Received On: 07/28/13 Assistance Needed: +1 History of Present Illness: DECOMPRESSION L4 - L5 WITH EXCISION OF SYNOVIAL CYST AND, LATERAL MASS FUSION 1 LEVEL       Prior Functioning     Home Living Family/patient expects to be discharged to:: Private residence Living Arrangements: Spouse/significant other Available Help at Discharge: Family Type of Home: House Home Access:  (4 inch stoop to get in) Home Layout: One level Home Equipment: Walker - 2 wheels;Bedside commode;Shower seat Prior Function Level of Independence: Independent Communication Communication: No difficulties         Vision/Perception     Cognition  Cognition Arousal/Alertness: Awake/alert Behavior During Therapy: WFL for tasks assessed/performed Overall Cognitive Status: Within Functional Limits for tasks assessed    Extremity/Trunk Assessment Upper Extremity Assessment Upper Extremity Assessment: Overall WFL for tasks assessed     Mobility  Transfers Overall transfer level:  Needs assistance Equipment used: Rolling walker (2 wheeled) Transfers: Sit to/from Stand Sit to Stand: Supervision General transfer comment: min verbal cues for back precautions.     Exercise     Balance     End of Session OT - End of Session Equipment Utilized During Treatment: Rolling walker Activity Tolerance: Patient tolerated treatment well Patient left: in chair;with call bell/phone within reach;with family/visitor present  GO     Jules Schick [phone] 07/28/2013, 10:07 AM

## 2013-08-04 ENCOUNTER — Other Ambulatory Visit: Payer: Self-pay | Admitting: *Deleted

## 2013-08-04 MED ORDER — GABAPENTIN 300 MG PO CAPS: 300.0000 mg | ORAL_CAPSULE | Freq: Three times a day (TID) | ORAL | Status: DC

## 2013-08-17 ENCOUNTER — Other Ambulatory Visit: Payer: Self-pay

## 2013-08-17 MED ORDER — AMLODIPINE BESYLATE 5 MG PO TABS: 5.0000 mg | ORAL_TABLET | ORAL | Status: DC

## 2013-08-19 ENCOUNTER — Telehealth: Payer: Self-pay

## 2013-08-19 NOTE — Telephone Encounter (Signed)
Received [place] Approval for Simvastatin 40 mg tablet .  Authorization is good until 08/19/2014.  [id number]

## 2013-09-07 DIAGNOSIS — Z9889 Other specified postprocedural states: Secondary | ICD-10-CM | POA: Diagnosis not present

## 2013-09-07 DIAGNOSIS — M5126 Other intervertebral disc displacement, lumbar region: Secondary | ICD-10-CM | POA: Diagnosis not present

## 2013-09-08 ENCOUNTER — Ambulatory Visit: Payer: Self-pay | Admitting: Podiatrist

## 2013-09-14 ENCOUNTER — Encounter: Payer: Self-pay | Admitting: Internal Medicine

## 2013-09-14 ENCOUNTER — Ambulatory Visit (INDEPENDENT_AMBULATORY_CARE_PROVIDER_SITE_OTHER): Payer: Medicare Other | Admitting: Internal Medicine

## 2013-09-14 VITALS — BP 142/88 | HR 61 | Temp 97.6°F | Wt 189.0 lb

## 2013-09-14 DIAGNOSIS — R059 Cough, unspecified: Secondary | ICD-10-CM

## 2013-09-14 DIAGNOSIS — R7309 Other abnormal glucose: Secondary | ICD-10-CM | POA: Diagnosis not present

## 2013-09-14 DIAGNOSIS — R05 Cough: Secondary | ICD-10-CM | POA: Diagnosis not present

## 2013-09-14 DIAGNOSIS — I10 Essential (primary) hypertension: Secondary | ICD-10-CM | POA: Diagnosis not present

## 2013-09-14 DIAGNOSIS — R7302 Impaired glucose tolerance (oral): Secondary | ICD-10-CM

## 2013-09-14 MED ORDER — TRAVOPROST (BAK FREE) 0.004 % OP SOLN: 1.0000 [drp] | Freq: Every day | OPHTHALMIC | Status: DC

## 2013-09-14 MED ORDER — LEVOFLOXACIN 250 MG PO TABS: 250.0000 mg | ORAL_TABLET | Freq: Every day | ORAL | Status: DC

## 2013-09-14 MED ORDER — FLUTICASONE PROPIONATE 50 MCG/ACT NA SUSP: 2.0000 | Freq: Every day | NASAL | Status: DC | PRN

## 2013-09-14 MED ORDER — HYDROCODONE-HOMATROPINE 5-1.5 MG/5ML PO SYRP: 5.0000 mL | ORAL_SOLUTION | Freq: Four times a day (QID) | ORAL | Status: DC | PRN

## 2013-09-14 NOTE — Assessment & Plan Note (Signed)
stable overall by history and exam, recent data reviewed with pt, and pt to continue medical treatment as before,  to f/u any worsening symptoms or concerns Lab Results  Component Value Date   HGBA1C 5.7 10/28/2012    

## 2013-09-14 NOTE — Assessment & Plan Note (Signed)
stable overall by history and exam, recent data reviewed with pt, and pt to continue medical treatment as before,  to f/u any worsening symptoms or concerns BP Readings from Last 3 Encounters:  09/14/13 142/88  07/28/13 136/65  07/28/13 136/65

## 2013-09-14 NOTE — Assessment & Plan Note (Signed)
Cant r/o atypical pna, declines cxr, for levaquin asd, cough med prn,  to f/u any worsening symptoms or concerns

## 2013-09-14 NOTE — Progress Notes (Signed)
Subjective:    Patient ID: [name], male    DOB: 13-Jul-1936, 78 y.o.   MRN: [id number]  HPI  Here to f/u, c/o acute onset 1 wk gradually worsening non prod cough, HA, general weakness and malaise, and ? Mild fever but Pt denies chest pain, increased sob or doe, wheezing, orthopnea, PND, increased LE swelling, palpitations, dizziness or syncope.  No recent overt aspiration, cough worse to lie down but denies significant sinus symptoms.   Pt denies polydipsia, polyuria, Pt denies new neurological symptoms such as new headache, or facial or extremity weakness or numbness Past Medical History  Diagnosis Date  . Other chronic nonalcoholic liver disease   . Diverticulosis of colon (without mention of hemorrhage)   . Gastritis   . Esophagitis   . Abdominal tenderness, epigastric   . Bradycardia   . Chest pain   . Dizziness   . Hepatotoxicity     drug induced  . Bladder cancer   . Cough   . Colonic polyp   . Allergic rhinitis   . Renal insufficiency   . CAD (coronary artery disease)   . CHF (congestive heart failure)   . Lumbar disc disease   . PUD (peptic ulcer disease)   . Hiatal hernia     with reflux  . Rheumatoid arthritis(714.0)   . HTN (hypertension)   . HLD (hyperlipidemia)   . GERD (gastroesophageal reflux disease)   . Depression   . S/P angioplasty   . Tremors of nervous system    Past Surgical History  Procedure Laterality Date  . Adenoidectomy    . Coronary angioplasty      percutaneious transluminal  . Tonsillectomy    . Bladder surgery    . Lumbar disc surgery  1/08  . Transurethral resection of bladder tumor    . Lumbar laminectomy/decompression microdiscectomy  07/09/2011    Procedure: LUMBAR LAMINECTOMY/DECOMPRESSION MICRODISCECTOMY;  Surgeon: Johnn Hai;  Location: [facility];  Service: Orthopedics;  Laterality: Left;  Decompression L5 - S1 on the Left and repair of dura  . Back surgery    . Lumbar laminectomy/decompression microdiscectomy N/A  07/27/2013    Procedure: DECOMPRESSION L4 - L5 WITH EXCISION OF SYNOVIAL CYST AND, LATERAL MASS FUSION 1 LEVEL;  Surgeon: Johnn Hai, MD;  Location: [facility];  Service: Orthopedics;  Laterality: N/A;    reports that he quit smoking about 27 years ago. His smoking use included Cigarettes. He has a 22.5 pack-year smoking history. He has never used smokeless tobacco. He reports that he does not drink alcohol or use illicit drugs. family history includes Cancer in his brother, father, and sister; Diabetes in his sister; Heart attack in his mother. Allergies  Allergen Reactions  . Hydrochlorothiazide     REACTION: hyponatremia with daily use  . Morphine And Related     Migraine   . Sulfonamide Derivatives     REACTION: rash   Current Outpatient Prescriptions on File Prior to Visit  Medication Sig Dispense Refill  . amLODipine (NORVASC) 5 MG tablet Take 1 tablet (5 mg total) by mouth every morning.  90 tablet  3  . Ascorbic Acid (VITAMIN C) 500 MG tablet Take 500 mg by mouth 2 (two) times daily.       . cetirizine (ZYRTEC) 10 MG tablet Take 10 mg by mouth daily as needed for allergies. As needed      . citalopram (CELEXA) 10 MG tablet Take 10 mg by mouth every morning.      Marland Kitchen  docusate sodium (COLACE) 100 MG capsule Take 1 capsule (100 mg total) by mouth 2 (two) times daily.  60 capsule  0  . gabapentin (NEURONTIN) 300 MG capsule Take 1-2 capsules (300-600 mg total) by mouth 4 (four) times daily - after meals and at bedtime.  450 capsule  1  . hydrochlorothiazide (HYDRODIURIL) 25 MG tablet Take 6.25 mg by mouth daily as needed (Takes only if BP > 180/90).      Marland Kitchen HYDROcodone-acetaminophen (NORCO) 5-325 MG per tablet Take 1-2 tablets by mouth every 6 (six) hours as needed for moderate pain or severe pain.  60 tablet  0  . hydrocortisone (ANUSOL-HC) 25 MG suppository Place 25 mg rectally 2 (two) times daily as needed for hemorrhoids. Itching      . lisinopril (PRINIVIL,ZESTRIL) 10 MG tablet Take 10  mg by mouth daily as needed (Takes only when BP >180/90).      . methylcellulose (ARTIFICIAL TEARS) 1 % ophthalmic solution Place 1 drop into both eyes 2 (two) times daily as needed (dry eyes).       . nitroGLYCERIN (NITROSTAT) 0.4 MG SL tablet Place 1 tablet (0.4 mg total) under the tongue every 5 (five) minutes as needed. Chest pain  60 tablet  5  . omeprazole (PRILOSEC) 20 MG capsule Take 20 mg by mouth 2 (two) times daily.      . pimecrolimus (ELIDEL) 1 % cream Apply 1 application topically 2 (two) times daily.      . primidone (MYSOLINE) 250 MG tablet Take 125 mg by mouth 3 (three) times daily.      . ranitidine (ZANTAC) 300 MG capsule Take 1 capsule (300 mg total) by mouth every evening.  90 capsule  3  . simvastatin (ZOCOR) 40 MG tablet Take 60 mg by mouth at bedtime.      . tamsulosin (FLOMAX) 0.4 MG CAPS capsule Take 0.4 mg by mouth daily.       No current facility-administered medications on file prior to visit.   Review of Systems  Constitutional: Negative for unexpected weight change, or unusual diaphoresis  HENT: Negative for tinnitus.   Eyes: Negative for photophobia and visual disturbance.  Respiratory: Negative for choking and stridor.   Gastrointestinal: Negative for vomiting and blood in stool.  Genitourinary: Negative for hematuria and decreased urine volume.  Musculoskeletal: Negative for acute joint swelling Skin: Negative for color change and wound.  Neurological: Negative for tremors and numbness other than noted  Psychiatric/Behavioral: Negative for decreased concentration or  hyperactivity.       Objective:   Physical Exam BP 142/88  Pulse 61  Temp(Src) 97.6 F (36.4 C) (Oral)  Wt 189 lb (85.73 kg)  SpO2 93% VS noted, mild ill Constitutional: Pt appears well-developed and well-nourished.  HENT: Head: NCAT.  Right Ear: External ear normal.  Left Ear: External ear normal.  Eyes: Conjunctivae and EOM are normal. Pupils are equal, round, and reactive to  light.  Neck: Normal range of motion. Neck supple.  Cardiovascular: Normal rate and regular rhythm.   Pulmonary/Chest: Effort normal and breath sounds decreased bilat, no wheeze, few RUL rales noted  Neurological: Pt is alert. Not confused  Skin: Skin is warm. No erythema.  Psychiatric: Pt behavior is normal. Thought content normal.     Assessment & Plan:

## 2013-09-14 NOTE — Progress Notes (Signed)
Pre visit review using our clinic review tool, if applicable. No additional management support is needed unless otherwise documented below in the visit note. 

## 2013-09-14 NOTE — Patient Instructions (Signed)
Please take all new medication as prescribed - the antibiotic, and cough medicine  Please continue all other medications as before, and refills have been done if requested. Please have the pharmacy call with any other refills you may need.  Please remember to sign up for MyChart if you have not done so, as this will be important to you in the future with finding out test results, communicating by private email, and scheduling acute appointments online when needed.   

## 2013-09-19 DIAGNOSIS — H409 Unspecified glaucoma: Secondary | ICD-10-CM | POA: Diagnosis not present

## 2013-09-19 DIAGNOSIS — H401290 Low-tension glaucoma, unspecified eye, stage unspecified: Secondary | ICD-10-CM | POA: Diagnosis not present

## 2013-09-19 DIAGNOSIS — H02059 Trichiasis without entropian unspecified eye, unspecified eyelid: Secondary | ICD-10-CM | POA: Diagnosis not present

## 2013-09-19 DIAGNOSIS — H2510 Age-related nuclear cataract, unspecified eye: Secondary | ICD-10-CM | POA: Diagnosis not present

## 2013-09-29 ENCOUNTER — Encounter: Payer: Self-pay | Admitting: Podiatrist

## 2013-09-29 ENCOUNTER — Ambulatory Visit (INDEPENDENT_AMBULATORY_CARE_PROVIDER_SITE_OTHER): Payer: Medicare Other | Admitting: Podiatrist

## 2013-09-29 VITALS — BP 137/76 | HR 60 | Resp 18

## 2013-09-29 DIAGNOSIS — M79609 Pain in unspecified limb: Secondary | ICD-10-CM | POA: Diagnosis not present

## 2013-09-29 DIAGNOSIS — B351 Tinea unguium: Secondary | ICD-10-CM

## 2013-09-29 DIAGNOSIS — M2020 Hallux rigidus, unspecified foot: Secondary | ICD-10-CM | POA: Diagnosis not present

## 2013-09-29 NOTE — Progress Notes (Deleted)
Nails and hallux rigidus right

## 2013-09-29 NOTE — Progress Notes (Signed)
   Subjective:    Patient ID: [name], male    DOB: [birth year], 78 y.o.   MRN: [id number]  HPI I am here to get my toenails trimmed up and my right big toe is sore and it curls in and fell in sept of last year racking leaves and hurt my back and I did have surgery in January of this year and found a cyst the size of a fifty cents piece    Review of Systems  HENT: Positive for hearing loss.   Genitourinary: Positive for difficulty urinating.  Musculoskeletal: Positive for back pain and gait problem.       Joint and muscle pain  Skin: Positive for rash.  Neurological: Positive for tremors.  Hematological: Bruises/bleeds easily.  Psychiatric/Behavioral: Positive for confusion. The patient is nervous/anxious.   All other systems reviewed and are negative.       Objective:   Physical Exam GENERAL APPEARANCE: Alert, conversant. Appropriately groomed. No acute distress.  VASCULAR: Pedal pulses palpable at 2/4 DP and PT bilateral.  Capillary refill time is immediate to all digits,  Proximal to distal cooling it warm to warm.  Digital hair growth is present bilateral  NEUROLOGIC: sensation is intact epicritically and protectively to 5.07 monofilament at 5/5 sites bilateral.  Light touch is intact bilateral, vibratory sensation intact bilateral, achilles tendon reflex is intact bilateral.  MUSCULOSKELETAL: decrease in range of motion present at the right great toe joint consistent with hallux limitus/ rigidus DERMATOLOGIC: toenails x 10 are thick, discolored, dystrophic with mycotic appearance present     Assessment & Plan:  Hallux limitus right/ symptomatic mycotic toenails  Discussed etiology, pathology, conservative vs. Surgical therapies and at this time debridement of symptomatic toenails was recommended.  Onychoreduction of symptomatic toenails was performed without iatrogenic incident.  Patient was instructed on signs and symptoms of infection and was told to call immediately should  any of these arise. Discussed wearing a stiff soled shoe or the possiblity of an orthotic for the hallux limitus.

## 2013-10-04 ENCOUNTER — Other Ambulatory Visit: Payer: Self-pay

## 2013-10-04 MED ORDER — GABAPENTIN 300 MG PO CAPS: 300.0000 mg | ORAL_CAPSULE | Freq: Three times a day (TID) | ORAL | Status: DC

## 2013-10-04 NOTE — Telephone Encounter (Signed)
I am not sure what this note refers to

## 2013-10-19 DIAGNOSIS — Z9889 Other specified postprocedural states: Secondary | ICD-10-CM | POA: Diagnosis not present

## 2013-10-19 DIAGNOSIS — M5126 Other intervertebral disc displacement, lumbar region: Secondary | ICD-10-CM | POA: Diagnosis not present

## 2013-10-19 DIAGNOSIS — Z981 Arthrodesis status: Secondary | ICD-10-CM | POA: Diagnosis not present

## 2013-10-20 ENCOUNTER — Ambulatory Visit (INDEPENDENT_AMBULATORY_CARE_PROVIDER_SITE_OTHER): Payer: Medicare Other | Admitting: Internal Medicine

## 2013-10-20 ENCOUNTER — Ambulatory Visit (INDEPENDENT_AMBULATORY_CARE_PROVIDER_SITE_OTHER)
Admission: RE | Admit: 2013-10-20 | Discharge: 2013-10-20 | Disposition: A | Discharge status: Home / Self Care | Payer: Medicare Other | Source: Ambulatory Visit | Attending: Internal Medicine | Admitting: Internal Medicine

## 2013-10-20 ENCOUNTER — Encounter: Payer: Self-pay | Admitting: Internal Medicine

## 2013-10-20 VITALS — BP 140/70 | HR 59 | Temp 98.3°F | Resp 15 | Wt 194.0 lb

## 2013-10-20 DIAGNOSIS — R05 Cough: Secondary | ICD-10-CM | POA: Diagnosis not present

## 2013-10-20 DIAGNOSIS — R0989 Other specified symptoms and signs involving the circulatory and respiratory systems: Secondary | ICD-10-CM | POA: Diagnosis not present

## 2013-10-20 DIAGNOSIS — J449 Chronic obstructive pulmonary disease, unspecified: Secondary | ICD-10-CM | POA: Diagnosis not present

## 2013-10-20 DIAGNOSIS — R059 Cough, unspecified: Secondary | ICD-10-CM

## 2013-10-20 DIAGNOSIS — I10 Essential (primary) hypertension: Secondary | ICD-10-CM | POA: Diagnosis not present

## 2013-10-20 MED ORDER — LOSARTAN POTASSIUM 50 MG PO TABS: 50.0000 mg | ORAL_TABLET | Freq: Every day | ORAL | Status: DC

## 2013-10-20 MED ORDER — HYDROCOD POLST-CHLORPHEN POLST 10-8 MG/5ML PO LQCR: 5.0000 mL | Freq: Two times a day (BID) | ORAL | Status: DC | PRN

## 2013-10-20 NOTE — Progress Notes (Signed)
   Subjective:    Patient ID: [name], male    DOB: 12-11-1935, 78 y.o.   MRN: [id number]  HPI  He returns with an exacerbation of his cough. He was treated in early March with 10 days of Levaquin and given a narcotic cough syrup. As of 4/30 symptoms recurred. He's had some sore throat. He has paroxysms of cough lasting up to 2 hours or more. There is some scant sputum production of  yellow material.The cough is mainly dry &  worse at night  There was no definite exposure to any ill individuals. There is some discomfort in the back with coughing. The discomfort is described as up to level V-6.   He has wheezing with paroxysms of  cough .  He quit smoking in 1987. He is on a protein pump inhibitor as well as H2 blocker.  Review of Systems He has no associated palpitations or dysrhythmias. He denies any dyspnea. No fever or chills. Hearing  He denies itchy, watery eyes.  He denies frontal headache, facial pain, and nasal purulence, exam, or otic discharge.  He was placed on the H2 blocker at the [place]. He has no reflux symptoms at this time.     Objective:   Physical Exam General appearance:good health ;well nourished; no acute distress or increased work of breathing is present.  No  lymphadenopathy about the head, neck, or axilla noted.   Eyes: No conjunctival inflammation or lid edema is present. There is no scleral icterus.  Ears:  External ear exam shows no significant lesions or deformities.  Otoscopic examination reveals clear canals, tympanic membranes are intact bilaterally without bulging, retraction, inflammation or discharge.  Nose:  External nasal examination shows no deformity or inflammation. Nasal mucosa are dry  without lesions or exudates. No septal dislocation or deviation.No obstruction to airflow.   Oral exam: Denture upper & lower partial ; lips and gums are healthy appearing.There is no oropharyngeal erythema or exudate noted.   Neck:  No deformities, masses, or  tenderness noted.   Supple with full range of motion without pain.   Heart:  Normal rate and regular rhythm. S1 and S2 normal without gallop, murmur, click, rub or other extra sounds.   Lungs: Faint rales are noted at the bases, left greater than right. No increased work of breathing.    Extremities:  No cyanosis, edema, or clubbing  noted    Skin: Warm & dry w/o jaundice or tenting.         Assessment & Plan:  #1 recurrent cough which is mainly nonproductive. The most likely etiology of this recurrence after taking a full course of Levaquin would be an ACE-induced cough and increased bradykinin production. Reflux could still be playing a component  #2 faint rales on auscultation  Plan: See orders

## 2013-10-20 NOTE — Progress Notes (Signed)
Pre visit review using our clinic review tool, if applicable. No additional management support is needed unless otherwise documented below in the visit note. 

## 2013-10-20 NOTE — Patient Instructions (Signed)
Reflux of gastric acid may be asymptomatic as this may occur mainly during sleep.The triggers for reflux  include stress; the "aspirin family" ; alcohol; peppermint; and caffeine (coffee, tea, cola, and chocolate). The aspirin family would include aspirin and the nonsteroidal agents such as ibuprofen &  Naproxen. Tylenol would not cause reflux. If having symptoms ; food & drink should be avoided for @ least 2 hours before going to bed.  Take the Omeprazole,protein pump inhibitor, 30 minutes before breakfast and 30 minutes before the evening meal for 8 weeks then go back to once a day  30 minutes before breakfast.

## 2013-10-21 MED ORDER — HYDROCOD POLST-CHLORPHEN POLST 10-8 MG/5ML PO LQCR: 5.0000 mL | Freq: Two times a day (BID) | ORAL | Status: DC | PRN

## 2013-10-21 NOTE — Addendum Note (Signed)
Addended by: Harl Bowie on: 10/21/2013 01:37 PM   Modules accepted: Orders

## 2013-11-01 ENCOUNTER — Encounter: Payer: Self-pay | Admitting: Internal Medicine

## 2013-11-01 ENCOUNTER — Ambulatory Visit (INDEPENDENT_AMBULATORY_CARE_PROVIDER_SITE_OTHER): Payer: Medicare Other | Admitting: Internal Medicine

## 2013-11-01 VITALS — BP 138/60 | HR 79 | Temp 98.2°F | Wt 194.0 lb

## 2013-11-01 DIAGNOSIS — R7309 Other abnormal glucose: Secondary | ICD-10-CM | POA: Diagnosis not present

## 2013-11-01 DIAGNOSIS — I10 Essential (primary) hypertension: Secondary | ICD-10-CM | POA: Diagnosis not present

## 2013-11-01 DIAGNOSIS — R059 Cough, unspecified: Secondary | ICD-10-CM

## 2013-11-01 DIAGNOSIS — R05 Cough: Secondary | ICD-10-CM

## 2013-11-01 DIAGNOSIS — R7302 Impaired glucose tolerance (oral): Secondary | ICD-10-CM

## 2013-11-01 DIAGNOSIS — M069 Rheumatoid arthritis, unspecified: Secondary | ICD-10-CM

## 2013-11-01 MED ORDER — METHYLPREDNISOLONE ACETATE 80 MG/ML IJ SUSP
80.0000 mg | Freq: Once | INTRAMUSCULAR | Status: AC
  Administered 2013-11-01: 80 mg via INTRAMUSCULAR

## 2013-11-01 MED ORDER — PREDNISONE 10 MG PO TABS: ORAL_TABLET | ORAL | Status: DC

## 2013-11-01 NOTE — Assessment & Plan Note (Signed)
stable overall by history and exam, recent data reviewed with pt, and pt to continue medical treatment as before,  to f/u any worsening symptoms or concerns Lab Results  Component Value Date   HGBA1C 5.7 10/28/2012    

## 2013-11-01 NOTE — Assessment & Plan Note (Signed)
stable overall by history and exam, recent data reviewed with pt, and pt to continue medical treatment as before,  to f/u any worsening symptoms or concerns BP Readings from Last 3 Encounters:  11/01/13 138/60  10/20/13 140/70  09/29/13 137/76

## 2013-11-01 NOTE — Assessment & Plan Note (Signed)
stable overall by history and exam, and pt to continue medical treatment as before,  to f/u any worsening symptoms or concerns 

## 2013-11-01 NOTE — Assessment & Plan Note (Signed)
Etiology unclear, I think overall most likely still related to the post nasal gtt/allergic rhinitis despite his current tx; for depomedrol IM, and predpack asd, cont all other meds,  to f/u any worsening symptoms or concerns

## 2013-11-01 NOTE — Progress Notes (Signed)
Subjective:    Patient ID: [name], male    DOB: 27-Mar-1936, 78 y.o.   MRN: [id number]  HPI  Here with persistent non prod cough since probably late march, no change despite tx with PPI at last visit and BP  Med changed to losartan.  No wheezing, CHF or fever noted.  Does have nasal allergies and post nasal gtt but mild as he has good compliance with flonase,  Cough not necessarily worse at night.  Recent CXR neg for acute apr 9.  No overt reflux.  But cough sometimes hard enough he gags, chokes and tries to vomit in the past wk several times.  Had zostavax at [facility] last wk.  Pt denies polydipsia, polyuria.  Past Medical History  Diagnosis Date  . Other chronic nonalcoholic liver disease   . Diverticulosis of colon (without mention of hemorrhage)   . Gastritis   . Esophagitis   . Abdominal tenderness, epigastric   . Bradycardia   . Chest pain   . Dizziness   . Hepatotoxicity     drug induced  . Bladder cancer   . Cough   . Colonic polyp   . Allergic rhinitis   . Renal insufficiency   . CAD (coronary artery disease)   . CHF (congestive heart failure)   . Lumbar disc disease   . PUD (peptic ulcer disease)   . Hiatal hernia     with reflux  . Rheumatoid arthritis(714.0)   . HTN (hypertension)   . HLD (hyperlipidemia)   . GERD (gastroesophageal reflux disease)   . Depression   . S/P angioplasty   . Tremors of nervous system    Past Surgical History  Procedure Laterality Date  . Adenoidectomy    . Coronary angioplasty      percutaneious transluminal  . Tonsillectomy    . Bladder surgery    . Lumbar disc surgery  1/08  . Transurethral resection of bladder tumor    . Lumbar laminectomy/decompression microdiscectomy  07/09/2011    Procedure: LUMBAR LAMINECTOMY/DECOMPRESSION MICRODISCECTOMY;  Surgeon: Johnn Hai;  Location: [facility];  Service: Orthopedics;  Laterality: Left;  Decompression L5 - S1 on the Left and repair of dura  . Back surgery    . Lumbar  laminectomy/decompression microdiscectomy N/A 07/27/2013    Procedure: DECOMPRESSION L4 - L5 WITH EXCISION OF SYNOVIAL CYST AND, LATERAL MASS FUSION 1 LEVEL;  Surgeon: Johnn Hai, MD;  Location: [facility];  Service: Orthopedics;  Laterality: N/A;    reports that he quit smoking about 28 years ago. His smoking use included Cigarettes. He has a 22.5 pack-year smoking history. He has never used smokeless tobacco. He reports that he does not drink alcohol or use illicit drugs. family history includes Cancer in his brother, father, and sister; Diabetes in his sister; Heart attack in his mother. Allergies  Allergen Reactions  . Hydrochlorothiazide     REACTION: hyponatremia with daily use  . Lisinopril     10/20/13 cough  . Morphine And Related     Migraine   . Sulfonamide Derivatives     REACTION: rash   Current Outpatient Prescriptions on File Prior to Visit  Medication Sig Dispense Refill  . amLODipine (NORVASC) 5 MG tablet Take 1 tablet (5 mg total) by mouth every morning.  90 tablet  3  . Ascorbic Acid (VITAMIN C) 500 MG tablet Take 500 mg by mouth 2 (two) times daily.       . cetirizine (ZYRTEC) 10  MG tablet Take 10 mg by mouth daily as needed for allergies. As needed      . chlorpheniramine-HYDROcodone (TUSSIONEX PENNKINETIC ER) 10-8 MG/5ML LQCR Take 5 mLs by mouth every 12 (twelve) hours as needed for cough.  115 mL  0  . citalopram (CELEXA) 10 MG tablet Take 10 mg by mouth every morning.      . docusate sodium (COLACE) 100 MG capsule Take 1 capsule (100 mg total) by mouth 2 (two) times daily.  60 capsule  0  . fluticasone (FLONASE) 50 MCG/ACT nasal spray Place 2 sprays into both nostrils daily as needed for rhinitis. Allergies  48 g  3  . gabapentin (NEURONTIN) 300 MG capsule Take 1-2 capsules (300-600 mg total) by mouth 4 (four) times daily - after meals and at bedtime.  450 capsule  1  . hydrochlorothiazide (HYDRODIURIL) 25 MG tablet Take 6.25 mg by mouth daily as needed (Takes only if  BP > 180/90).      Marland Kitchen HYDROcodone-acetaminophen (NORCO) 5-325 MG per tablet Take 1-2 tablets by mouth every 6 (six) hours as needed for moderate pain or severe pain.  60 tablet  0  . hydrocortisone (ANUSOL-HC) 25 MG suppository Place 25 mg rectally 2 (two) times daily as needed for hemorrhoids. Itching      . losartan (COZAAR) 50 MG tablet Take 1 tablet (50 mg total) by mouth daily.  30 tablet  2  . methylcellulose (ARTIFICIAL TEARS) 1 % ophthalmic solution Place 1 drop into both eyes 2 (two) times daily as needed (dry eyes).       . nitroGLYCERIN (NITROSTAT) 0.4 MG SL tablet Place 1 tablet (0.4 mg total) under the tongue every 5 (five) minutes as needed. Chest pain  60 tablet  5  . omeprazole (PRILOSEC) 20 MG capsule Take 20 mg by mouth 2 (two) times daily.      . pimecrolimus (ELIDEL) 1 % cream Apply 1 application topically 2 (two) times daily.      . primidone (MYSOLINE) 250 MG tablet Take 125 mg by mouth 3 (three) times daily.      . ranitidine (ZANTAC) 300 MG capsule Take 1 capsule (300 mg total) by mouth every evening.  90 capsule  3  . simvastatin (ZOCOR) 40 MG tablet Take 60 mg by mouth at bedtime.      . tamsulosin (FLOMAX) 0.4 MG CAPS capsule Take 0.4 mg by mouth daily.      . Travoprost, BAK Free, (TRAVATAN Z) 0.004 % SOLN ophthalmic solution Place 1 drop into both eyes at bedtime.  5 mL  5   No current facility-administered medications on file prior to visit.   Review of Systems  Constitutional: Negative for unexpected weight change, or unusual diaphoresis  HENT: Negative for tinnitus.   Eyes: Negative for photophobia and visual disturbance.  Respiratory: Negative for choking and stridor.   Gastrointestinal: Negative for vomiting and blood in stool.  Genitourinary: Negative for hematuria and decreased urine volume.  Musculoskeletal: Negative for acute joint swelling Skin: Negative for color change and wound.  Neurological: Negative for tremors and numbness other than noted    Psychiatric/Behavioral: Negative for decreased concentration or  hyperactivity.       Objective:   Physical Exam BP 138/60  Pulse 79  Temp(Src) 98.2 F (36.8 C) (Oral)  Wt 194 lb (87.998 kg)  SpO2 93% VS noted,  Constitutional: Pt appears well-developed and well-nourished.  HENT: Head: NCAT.  Right Ear: External ear normal.  Left Ear: External  ear normal.  Eyes: Conjunctivae and EOM are normal. Pupils are equal, round, and reactive to light.  Bilat tm's with mild erythema.  Max sinus areas non tender.  Pharynx with mild erythema, no exudate Neck: Normal range of motion. Neck supple.  Cardiovascular: Normal rate and regular rhythm.   Pulmonary/Chest: Effort normal and breath sounds normal.  Abd:  Soft, NT, non-distended, + BS Neurological: Pt is alert. Not confused  Skin: Skin is warm. No erythema. No synovitis Psychiatric: Pt behavior is normal. Thought content normal.      Assessment & Plan:

## 2013-11-01 NOTE — Progress Notes (Signed)
Pre visit review using our clinic review tool, if applicable. No additional management support is needed unless otherwise documented below in the visit note. 

## 2013-11-01 NOTE — Patient Instructions (Addendum)
You had the steroid shot today  Please take all new medication as prescribed - the prednisone  Please continue all other medications as before, and refills have been done if requested. Please have the pharmacy call with any other refills you may need.  Please continue your efforts at being more active, low cholesterol diet, and weight control.

## 2013-12-05 ENCOUNTER — Encounter: Payer: Self-pay | Admitting: Internal Medicine

## 2013-12-29 ENCOUNTER — Encounter: Payer: Self-pay | Admitting: Family Medicine

## 2013-12-29 ENCOUNTER — Ambulatory Visit (INDEPENDENT_AMBULATORY_CARE_PROVIDER_SITE_OTHER): Payer: Medicare Other | Admitting: Family Medicine

## 2013-12-29 ENCOUNTER — Ambulatory Visit (INDEPENDENT_AMBULATORY_CARE_PROVIDER_SITE_OTHER): Payer: Medicare Other | Admitting: Internal Medicine

## 2013-12-29 ENCOUNTER — Other Ambulatory Visit (INDEPENDENT_AMBULATORY_CARE_PROVIDER_SITE_OTHER): Payer: Medicare Other

## 2013-12-29 ENCOUNTER — Encounter: Payer: Self-pay | Admitting: Internal Medicine

## 2013-12-29 VITALS — BP 120/70 | HR 63 | Temp 98.3°F | Wt 182.5 lb

## 2013-12-29 VITALS — BP 120/70 | HR 63 | Ht 67.0 in | Wt 182.0 lb

## 2013-12-29 DIAGNOSIS — M25529 Pain in unspecified elbow: Secondary | ICD-10-CM

## 2013-12-29 DIAGNOSIS — M7710 Lateral epicondylitis, unspecified elbow: Secondary | ICD-10-CM

## 2013-12-29 DIAGNOSIS — I10 Essential (primary) hypertension: Secondary | ICD-10-CM

## 2013-12-29 DIAGNOSIS — M25521 Pain in right elbow: Secondary | ICD-10-CM | POA: Insufficient documentation

## 2013-12-29 DIAGNOSIS — M7711 Lateral epicondylitis, right elbow: Secondary | ICD-10-CM | POA: Insufficient documentation

## 2013-12-29 MED ORDER — GABAPENTIN 300 MG PO CAPS: 300.0000 mg | ORAL_CAPSULE | Freq: Three times a day (TID) | ORAL | Status: DC

## 2013-12-29 MED ORDER — LOSARTAN POTASSIUM 50 MG PO TABS: 50.0000 mg | ORAL_TABLET | Freq: Every day | ORAL | Status: DC

## 2013-12-29 MED ORDER — PRIMIDONE 250 MG PO TABS: 125.0000 mg | ORAL_TABLET | Freq: Three times a day (TID) | ORAL | Status: DC

## 2013-12-29 MED ORDER — SIMVASTATIN 40 MG PO TABS: 60.0000 mg | ORAL_TABLET | Freq: Every day | ORAL | Status: DC

## 2013-12-29 MED ORDER — OMEPRAZOLE 20 MG PO CPDR: 20.0000 mg | DELAYED_RELEASE_CAPSULE | Freq: Two times a day (BID) | ORAL | Status: DC

## 2013-12-29 NOTE — Progress Notes (Signed)
Pre visit review using our clinic review tool, if applicable. No additional management support is needed unless otherwise documented below in the visit note. 

## 2013-12-29 NOTE — Assessment & Plan Note (Signed)
Patient does have lateral epicondylitis. Patient was fitted with a wrist brace today. We discussed proper lifting technique him how to avoid using the wrist extensor muscles. We discussed icing protocol and over-the-counter topical medications. Patient does not make any significant improvement we might need to have did do a rheumatological workup for more treatment.  Spent greater than 25 minutes with patient face-to-face and had greater than 50% of counseling including as described above in assessment and plan.

## 2013-12-29 NOTE — Patient Instructions (Signed)
Nice to meet you Ice 20 minutes 2 times a day Exercises 3 times a week Capsicin topically 2 times daily can help. Wear the brace day and night for next 2 weeks then nightly for 2 weeks.  Come back in 2-3 weeks to make sure you are doing better

## 2013-12-29 NOTE — Assessment & Plan Note (Signed)
prob lateral epicondylitis, no charge for exam today, should see Dr Smith/[facility]  - may need cortisone shot

## 2013-12-29 NOTE — Progress Notes (Signed)
   Subjective:    Patient ID: [name], male    DOB: 07/01/1936, 78 y.o.   MRN: [id number]  HPI  Here with tenderness mod to severe in the past wk near right elbow    Review of Systems     Objective:   Physical Exam  Tender right lateral epicondyle with palpable soft tissue swelling      Assessment & Plan:

## 2013-12-29 NOTE — Progress Notes (Signed)
  [facility] [address] [place], [place] [address] Phone: [phone] Subjective:    I'm seeing this patient by the request  of:  Cathlean Cower, MD   CC: right elbow pain.   [id number] [name] is a 78 y.o. male coming in with complaint of right elbow pain. Patient states it has gotten much worse over the last 3 weeks. Patient does have a past medical history significant for rheumatoid arthritis. Patient states that it hurts more when he moves certain ways or if he tries to. Something heavy. Patient denies any type injury. Patient denies any significant radiation down the he and her any numbness or any weakness. Patient states nothing seems to help it. Patient's severity of 6/10.     Past medical history, social, surgical and family history all reviewed in electronic medical record.   Review of Systems: No headache, visual changes, nausea, vomiting, diarrhea, constipation, dizziness, abdominal pain, skin rash, fevers, chills, night sweats, weight loss, swollen lymph nodes, body aches, joint swelling, muscle aches, chest pain, shortness of breath, mood changes.   Objective Blood pressure 120/70, pulse 63, height 5\' 7"  (1.702 m), weight 182 lb (82.555 kg), SpO2 96.00%.  General: No apparent distress alert and oriented x3 mood and affect normal, dressed appropriately.  HEENT: Pupils equal, extraocular movements intact  Respiratory: Patient's speak in full sentences and does not appear short of breath  Cardiovascular: No lower extremity edema, non tender, no erythema  Skin: Warm dry intact with no signs of infection or rash on extremities or on axial skeleton.  Abdomen: Soft nontender  Neuro: Cranial nerves II through XII are intact, neurovascularly intact in all extremities with 2+ DTRs and 2+ pulses.  Lymph: No lymphadenopathy of posterior or anterior cervical chain or axillae bilaterally.  Gait normal with good balance and coordination.  MSK:  Non  tender with full range of motion and good stability and symmetric strength and tone of shoulders, wrist, hip, knee and ankles bilaterally.  Elbow: Right Unremarkable to inspection. Range of motion full pronation, supination, flexion, extension. Strength is full to all of the above directions Stable to varus, valgus stress. Negative moving valgus stress test. Tender to palpation over the lateral epicondylar region. Pain with extension the middle finger at the lateral epicondylar region the Ulnar nerve does not sublux. Negative cubital tunnel Tinel's.  Musculoskeletal ultrasound was performed and interpreted by Charlann Boxer D.O.   Elbow:  Lateral epicondyle and common extensor tendon origin visualized.  With very small tear noted. Increasing Doppler flow noted with some hypoechoic changes. n.  Radial head unremarkable and located in annular ligament Medial epicondyle and common flexor tendon origin visualized.  No edema, effusions, or avulsions seen. Ulnar nerve in cubital tunnel unremarkable. Olecranon and triceps insertion visualized and unremarkable without edema, effusion, or avulsion. There does appear to be a possible rheumatologic nodule noted. No signs olecranon bursitis.   IMPRESSION: Lateral epicondylitis with small partial tear. Question of some mild rheumatoid nodule present on the olecranon.    Impression and Recommendations:     This case required medical decision making of moderate complexity.

## 2013-12-29 NOTE — Patient Instructions (Signed)
No charge for exam today  Please see Dr Tamala Julian in this office for your right elbow - you have an appt at 1215.

## 2014-01-02 ENCOUNTER — Ambulatory Visit: Payer: Medicare Other | Admitting: Family Medicine

## 2014-01-11 ENCOUNTER — Ambulatory Visit: Payer: Medicare Other | Admitting: Podiatrist

## 2014-01-12 ENCOUNTER — Ambulatory Visit (INDEPENDENT_AMBULATORY_CARE_PROVIDER_SITE_OTHER): Payer: Medicare Other | Admitting: Podiatrist

## 2014-01-12 DIAGNOSIS — M79609 Pain in unspecified limb: Secondary | ICD-10-CM | POA: Diagnosis not present

## 2014-01-12 DIAGNOSIS — L600 Ingrowing nail: Secondary | ICD-10-CM | POA: Diagnosis not present

## 2014-01-12 DIAGNOSIS — B351 Tinea unguium: Secondary | ICD-10-CM

## 2014-01-12 DIAGNOSIS — M79673 Pain in unspecified foot: Secondary | ICD-10-CM

## 2014-01-12 NOTE — Patient Instructions (Signed)

## 2014-01-12 NOTE — Progress Notes (Signed)
Subjective: Patient presents today for foot and nail care. Relates that the right great toe is painful and sore on the right medial corner. States it's been sore for several weeks duration and he continues to get worse and worse.  Physical Exam  GENERAL APPEARANCE: Alert, conversant. Appropriately groomed. No acute distress.  VASCULAR: Pedal pulses palpable at 2/4 DP and PT bilateral. Capillary refill time is immediate to all digits, Proximal to distal cooling it warm to warm. Digital hair growth is present bilateral  NEUROLOGIC: sensation is intact epicritically and protectively to 5.07 monofilament at 5/5 sites bilateral. Light touch is intact bilateral, vibratory sensation intact bilateral, achilles tendon reflex is intact bilateral.  MUSCULOSKELETAL: decrease in range of motion present at the right great toe joint consistent with hallux limitus/ rigidus  DERMATOLOGIC: toenails x 10 are thick, discolored, dystrophic with mycotic appearance present ingrown right hallux nail medial nail border is noted. Pain with pressure and palpation is present. No sign of infection is present.  Assessment: Ingrown toenail right first, symptomatic mycotic toenails  Plan:Treatment options and alternatives discussed.  Recommended permanent phenol matrixectomy and patient agreed.  Right great toe was prepped with alcohol and a 1 to 1 mix of 0.5% marcaine plain and 2% lidocaine plain was administered in a digital block fashion.  The toe was then prepped with betadine solution and exsanguinated.  The medial nail border was then excised and matrix tissue exposed.  Phenol was then applied to the matrix tissue followed by an alcohol wash.  Antibiotic ointment and a dry sterile dressing was applied.  The patient was dispensed instructions for aftercare. Also debrided the remainder of the toenails without complication. He will call if any redness, swelling or signs of infection arises otherwise seen back for his 3 month  appointment.

## 2014-01-19 ENCOUNTER — Ambulatory Visit (INDEPENDENT_AMBULATORY_CARE_PROVIDER_SITE_OTHER): Payer: Medicare Other | Admitting: Family Medicine

## 2014-01-19 ENCOUNTER — Encounter: Payer: Self-pay | Admitting: Family Medicine

## 2014-01-19 VITALS — BP 130/84 | HR 56 | Ht 67.0 in | Wt 187.0 lb

## 2014-01-19 DIAGNOSIS — M25521 Pain in right elbow: Secondary | ICD-10-CM

## 2014-01-19 DIAGNOSIS — M25529 Pain in unspecified elbow: Secondary | ICD-10-CM | POA: Diagnosis not present

## 2014-01-19 NOTE — Patient Instructions (Signed)
Good to see you You have a foreign body in your elbow.  We tried to break it up. Hopefully this will help Continue icing and the exercises at least 3 times a week No need for the brace Continue the vitamin D  Come back in 3-4 weeks.

## 2014-01-19 NOTE — Assessment & Plan Note (Signed)
I do believe the patient's right elbow pain is no secondary to intra-articular changes. I do feel that further imaging may need to be necessary.Patient does not make any significant improvement affect an x-ray of the elbow for further evaluation and an MRI may need to be necessary. Patient may actually respond well to a arthroscopic procedure if this continues to be a problem. In the interim he'll continue with conservative therapy as well as icing that I do not feel that bracing is necessary anymore. Patient's will hopefully we'll get improvement after the steroid injection.   Spent greater than 25 minutes with patient face-to-face and had greater than 50% of counseling including as described above in assessment and plan.

## 2014-01-19 NOTE — Progress Notes (Signed)
[facility] [address] [place], [place] [address] Phone: [phone] Subjective:    I CC: right elbow pain follow up.   [id number] RAJINDER MESICK is a 78 y.o. male coming in with complaint of right elbow pain. Patient was seen previously and had lateral epicondylitis diagnosed as well as the potential nodule that seem to be within the elbow joint itself. Patient states she has not made any significant improvement even though he's been wearing the brace, icing, and home exercises. Patient states that he actually a soreness with certain range of motion of the elbow especially with flexion and extension. Can be a sharp pain and catches him and and seems to go away. It is affecting some of his daily activities.     Past medical history, social, surgical and family history all reviewed in electronic medical record.   Review of Systems: No headache, visual changes, nausea, vomiting, diarrhea, constipation, dizziness, abdominal pain, skin rash, fevers, chills, night sweats, weight loss, swollen lymph nodes, body aches, joint swelling, muscle aches, chest pain, shortness of breath, mood changes.   Objective Blood pressure 130/84, pulse 56, height 5\' 7"  (1.702 m), weight 187 lb (84.823 kg), SpO2 98.00%.  General: No apparent distress alert and oriented x3 mood and affect normal, dressed appropriately.  HEENT: Pupils equal, extraocular movements intact  Respiratory: Patient's speak in full sentences and does not appear short of breath  Cardiovascular: No lower extremity edema, non tender, no erythema  Skin: Warm dry intact with no signs of infection or rash on extremities or on axial skeleton.  Abdomen: Soft nontender  Neuro: Cranial nerves II through XII are intact, neurovascularly intact in all extremities with 2+ DTRs and 2+ pulses.  Lymph: No lymphadenopathy of posterior or anterior cervical chain or axillae bilaterally.  Gait normal with good balance and  coordination.  MSK:  Non tender with full range of motion and good stability and symmetric strength and tone of shoulders, wrist, hip, knee and ankles bilaterally.  Elbow: Right Unremarkable to inspection. Range of motion full pronation, supination, flexion, extension. Strength is full to all of the above directions Stable to varus, valgus stress. Negative moving valgus stress test. Tender to palpation over the lateral epicondylar region. Pain with extension the middle finger at the lateral epicondylar regio. Patient also has pain with full extension and full flexion of the elbow joint itself. Ulnar nerve does not sublux. Negative cubital tunnel Tinel's.  Musculoskeletal ultrasound was performed and interpreted by Charlann Boxer D.O.   Elbow:  Lateral epicondyle and common extensor tendon origin visualized.  With very small tear noted. Increasing Doppler flow noted with some hypoechoic changes. n.  Radial head unremarkable and located in annular ligament Medial epicondyle and common flexor tendon origin visualized.  No edema, effusions, or avulsions seen. Ulnar nerve in cubital tunnel unremarkable. Olecranon and triceps insertion visualized and unremarkable without edema, effusion, or avulsion. There does appear to be a possible rheumatologic nodule noted. No signs olecranon bursitis. Patient also has what appears to be a foreign body within the elbow joint itself. With compression on top with the ultrasound this seems to move did this does have a hyperechoic area.  IMPRESSION: Lateral epicondylitis with small partial tear. Foreign-body within joint  Procedure: Real-time Ultrasound Guided Injection of right elbow joint Device: GE Logiq E  Ultrasound guided injection is preferred based studies that show increased duration, increased effect, greater accuracy, decreased procedural pain, increased response rate with ultrasound guided versus  blind injection.  Verbal informed consent obtained.    Time-out conducted.  Noted no overlying erythema, induration, or other signs of local infection.  Skin prepped in a sterile fashion.  Local anesthesia: Topical Ethyl chloride.  With sterile technique and under real time ultrasound guidance:  median nerve visualized.  25g 1 inch needle inserted distal to proximal approach into the joint. With ultrasound guidance calcium piece contact was made steroid injection was placed. Unfortunately do to the mobility of this structure it was difficult to do any hydrodissection.. Pictures taken nfor needle placement. Patient did have injection of  1 cc of 0.5% Marcaine, and 1 cc of Kenalog 40 mg/dL. Completed without difficulty  Pain immediately resolved suggesting accurate placement of the medication.  Advised to call if fevers/chills, erythema, induration, drainage, or persistent bleeding.  Images permanently stored and available for review in the ultrasound unit.  Impression: Technically successful ultrasound guided injection.    Impression and Recommendations:     This case required medical decision making of moderate complexity.

## 2014-02-16 ENCOUNTER — Ambulatory Visit: Payer: Medicare Other | Admitting: Family Medicine

## 2014-02-16 ENCOUNTER — Ambulatory Visit: Payer: Medicare Other | Admitting: Internal Medicine

## 2014-02-20 ENCOUNTER — Encounter: Payer: Self-pay | Admitting: Family Medicine

## 2014-02-20 ENCOUNTER — Ambulatory Visit (INDEPENDENT_AMBULATORY_CARE_PROVIDER_SITE_OTHER): Payer: Medicare Other | Admitting: Family Medicine

## 2014-02-20 VITALS — BP 129/75 | HR 59 | Ht 68.0 in | Wt 191.0 lb

## 2014-02-20 DIAGNOSIS — M7711 Lateral epicondylitis, right elbow: Secondary | ICD-10-CM

## 2014-02-20 DIAGNOSIS — M7710 Lateral epicondylitis, unspecified elbow: Secondary | ICD-10-CM | POA: Diagnosis not present

## 2014-02-20 NOTE — Progress Notes (Signed)
  [facility] [address] [place], [place] [address] Phone: [phone] Subjective:    I CC: right elbow pain follow up.   [id number] [name] is a 78 y.o. male coming in with complaint of right elbow pain.  Patient was found to have a foreign body within the joint space. She may have been a broken piece from a osteophyte. Patient did have an injection and states that he is significantly better at this time. Patient has full range of motion. Stop him from any activities. Patient is able to do all activities without any significant discomfort.    Past medical history, social, surgical and family history all reviewed in electronic medical record.   Review of Systems: No headache, visual changes, nausea, vomiting, diarrhea, constipation, dizziness, abdominal pain, skin rash, fevers, chills, night sweats, weight loss, swollen lymph nodes, body aches, joint swelling, muscle aches, chest pain, shortness of breath, mood changes.   Objective Blood pressure 129/75, pulse 59, height 5\' 8"  (1.727 m), weight 191 lb (86.637 kg).  General: No apparent distress alert and oriented x3 mood and affect normal, dressed appropriately.  HEENT: Pupils equal, extraocular movements intact  Respiratory: Patient's speak in full sentences and does not appear short of breath  Cardiovascular: No lower extremity edema, non tender, no erythema  Skin: Warm dry intact with no signs of infection or rash on extremities or on axial skeleton.  Abdomen: Soft nontender  Neuro: Cranial nerves II through XII are intact, neurovascularly intact in all extremities with 2+ DTRs and 2+ pulses.  Lymph: No lymphadenopathy of posterior or anterior cervical chain or axillae bilaterally.  Gait normal with good balance and coordination.  MSK:  Non tender with full range of motion and good stability and symmetric strength and tone of shoulders, wrist, hip, knee and ankles bilaterally.  Elbow:  Right Unremarkable to inspection. Range of motion full pronation, supination, flexion, extension. Strength is full to all of the above directions Stable to varus, valgus stress. Negative moving valgus stress test. Nontender on exam Ulnar nerve does not sublux. Negative cubital tunnel Tinel's.  Musculoskeletal ultrasound was performed and interpreted by Charlann Boxer D.O.   Elbow:  Lateral epicondyle and common extensor tendon origin visualized.  Continue small tear but has not seemed be painful. Radial head unremarkable and located in annular ligament Medial epicondyle and common flexor tendon origin visualized.  No edema, effusions, or avulsions seen. Ulnar nerve in cubital tunnel unremarkable. Olecranon and triceps insertion visualized and unremarkable without edema, effusion, or avulsion. There does appear to be a possible rheumatologic nodule noted. No signs olecranon bursitis. Patient still has calcific nodule that is only 1/10 the sizer was previously still give the joint.  IMPRESSION: Significant smaller calcific mass and elbow joint.    Impression and Recommendations:     This case required medical decision making of moderate complexity.

## 2014-02-20 NOTE — Patient Instructions (Signed)
Good to see you.  You are doing great.  Ice when you need it.  Vitamin D continue See me when need me.

## 2014-02-20 NOTE — Assessment & Plan Note (Signed)
Patient is doing significantly better at this time. Patient is really asymptomatic. Encourage him to continue icing when he is having any pain. Patient will come back if he starts losing any the range of motion or starts having worsening pain again. Otherwise patient can follow up on an as-needed basis.

## 2014-03-02 DIAGNOSIS — M545 Low back pain, unspecified: Secondary | ICD-10-CM | POA: Diagnosis not present

## 2014-03-02 DIAGNOSIS — IMO0002 Reserved for concepts with insufficient information to code with codable children: Secondary | ICD-10-CM | POA: Diagnosis not present

## 2014-03-22 DIAGNOSIS — M545 Low back pain, unspecified: Secondary | ICD-10-CM | POA: Diagnosis not present

## 2014-03-22 DIAGNOSIS — IMO0002 Reserved for concepts with insufficient information to code with codable children: Secondary | ICD-10-CM | POA: Diagnosis not present

## 2014-03-22 DIAGNOSIS — I10 Essential (primary) hypertension: Secondary | ICD-10-CM | POA: Diagnosis not present

## 2014-03-22 DIAGNOSIS — Z9889 Other specified postprocedural states: Secondary | ICD-10-CM | POA: Diagnosis not present

## 2014-03-23 ENCOUNTER — Ambulatory Visit (INDEPENDENT_AMBULATORY_CARE_PROVIDER_SITE_OTHER): Payer: Medicare Other | Admitting: Podiatrist

## 2014-03-23 DIAGNOSIS — M79609 Pain in unspecified limb: Secondary | ICD-10-CM

## 2014-03-23 DIAGNOSIS — M79676 Pain in unspecified toe(s): Principal | ICD-10-CM

## 2014-03-23 DIAGNOSIS — B351 Tinea unguium: Secondary | ICD-10-CM

## 2014-03-23 NOTE — Progress Notes (Signed)
HPI:  Patient presents today for follow up of foot and nail care. Denies any new complaints today.  Objective:  Patients chart is reviewed.  Vascular status reveals pedal pulses noted at 2 out of 4 dp and pt bilateral .  Neurological sensation is intact to [employer] monofilament bilateral.  Patients nails are thickened, discolored, distrophic, friable and brittle with yellow-brown discoloration. Patient subjectively relates they are painful with shoes and with ambulation of bilateral feet.  Assessment:  Symptomatic onychomycosis  Plan:  Discussed treatment options and alternatives.  The symptomatic toenails were debrided through manual an mechanical means without complication.  Return appointment recommended at routine intervals of 3 months       

## 2014-03-27 DIAGNOSIS — H5340 Unspecified visual field defects: Secondary | ICD-10-CM | POA: Diagnosis not present

## 2014-03-27 DIAGNOSIS — H401290 Low-tension glaucoma, unspecified eye, stage unspecified: Secondary | ICD-10-CM | POA: Diagnosis not present

## 2014-03-27 DIAGNOSIS — H409 Unspecified glaucoma: Secondary | ICD-10-CM | POA: Diagnosis not present

## 2014-03-27 DIAGNOSIS — H01009 Unspecified blepharitis unspecified eye, unspecified eyelid: Secondary | ICD-10-CM | POA: Diagnosis not present

## 2014-03-29 DIAGNOSIS — M545 Low back pain, unspecified: Secondary | ICD-10-CM | POA: Diagnosis not present

## 2014-04-04 DIAGNOSIS — M545 Low back pain, unspecified: Secondary | ICD-10-CM | POA: Diagnosis not present

## 2014-04-04 DIAGNOSIS — IMO0002 Reserved for concepts with insufficient information to code with codable children: Secondary | ICD-10-CM | POA: Diagnosis not present

## 2014-04-04 DIAGNOSIS — M47817 Spondylosis without myelopathy or radiculopathy, lumbosacral region: Secondary | ICD-10-CM | POA: Diagnosis not present

## 2014-04-04 DIAGNOSIS — M5137 Other intervertebral disc degeneration, lumbosacral region: Secondary | ICD-10-CM | POA: Diagnosis not present

## 2014-04-05 DIAGNOSIS — Z23 Encounter for immunization: Secondary | ICD-10-CM | POA: Diagnosis not present

## 2014-04-21 ENCOUNTER — Other Ambulatory Visit: Payer: Self-pay | Admitting: Internal Medicine

## 2014-04-21 NOTE — Telephone Encounter (Signed)
Done erx 

## 2014-05-04 ENCOUNTER — Ambulatory Visit (INDEPENDENT_AMBULATORY_CARE_PROVIDER_SITE_OTHER): Payer: Medicare Other | Admitting: Internal Medicine

## 2014-05-04 ENCOUNTER — Encounter: Payer: Self-pay | Admitting: Internal Medicine

## 2014-05-04 VITALS — BP 132/82 | HR 61 | Temp 98.3°F | Wt 195.0 lb

## 2014-05-04 DIAGNOSIS — I10 Essential (primary) hypertension: Secondary | ICD-10-CM | POA: Diagnosis not present

## 2014-05-04 DIAGNOSIS — M5416 Radiculopathy, lumbar region: Secondary | ICD-10-CM

## 2014-05-04 DIAGNOSIS — R7302 Impaired glucose tolerance (oral): Secondary | ICD-10-CM

## 2014-05-04 DIAGNOSIS — K130 Diseases of lips: Secondary | ICD-10-CM | POA: Diagnosis not present

## 2014-05-04 MED ORDER — CLOTRIMAZOLE-BETAMETHASONE 1-0.05 % EX CREA: TOPICAL_CREAM | CUTANEOUS | Status: DC

## 2014-05-04 MED ORDER — KETOROLAC TROMETHAMINE 30 MG/ML IJ SOLN
30.0000 mg | Freq: Once | INTRAMUSCULAR | Status: AC
  Administered 2014-05-04: 30 mg via INTRAMUSCULAR

## 2014-05-04 NOTE — Progress Notes (Signed)
Pre visit review using our clinic review tool, if applicable. No additional management support is needed unless otherwise documented below in the visit note. 

## 2014-05-04 NOTE — Progress Notes (Signed)
Subjective:    Patient ID: [name], male    DOB: May 15, 1936, 78 y.o.   MRN: [id number]  HPI Here to f/u with wife in a bit of frustration it seems with respect to recent management of his back pain, more tense, nervous today with pressured speech, wife helps with hx to support and clarify.  Pt with recent worsening LBP and recurring left buttock and leg pain now radiating to left foot, seems gradually worsening but persistent now mod most of the time over last 2 months.  Has seen orthopedic several times, is s/p lumbar surgury jan 2015, and has been reassured that the plan now was not rush into further surgury, in fact waiting at least a yr from the previous was preferred.  Pt has 3rd percocet rx for 5/325 at home per ortho, but after second rx for #40 thought on his own he should not take further as he might get stomach ulcers.  Has been tylenol arthritis but not really helping, also upped his gabapentin to 7-8 tabs per day, helps a bit more without sedation.  Had recent predpack for 12 days aug 20, MRI completed by sept 16 but not sure of results, then on f/u was asked to have series of ESI which he has not been yet contacted to set up, states was informed by staff it would need to be approved by medicare. Admits he is frustrated over the length of time this is all playing out, the severity of pain, and worsening condition, now with slight dragging of the leg with ambulation. No falls, and no bowel or bladder change, fever, wt loss.  Also mentions did eat tomato recently which he does not normally do, now with mild painful cracking at both sides of the mouth, no hx of this. Pt denies chest pain, increased sob or doe, wheezing, orthopnea, PND, increased LE swelling, palpitations, dizziness or syncope. Past Medical History  Diagnosis Date  . Other chronic nonalcoholic liver disease   . Diverticulosis of colon (without mention of hemorrhage)   . Gastritis   . Esophagitis   . Abdominal tenderness,  epigastric   . Bradycardia   . Chest pain   . Dizziness   . Hepatotoxicity     drug induced  . Bladder cancer   . Cough   . Colonic polyp   . Allergic rhinitis   . Renal insufficiency   . CAD (coronary artery disease)   . CHF (congestive heart failure)   . Lumbar disc disease   . PUD (peptic ulcer disease)   . Hiatal hernia     with reflux  . Rheumatoid arthritis(714.0)   . HTN (hypertension)   . HLD (hyperlipidemia)   . GERD (gastroesophageal reflux disease)   . Depression   . S/P angioplasty   . Tremors of nervous system    Past Surgical History  Procedure Laterality Date  . Adenoidectomy    . Coronary angioplasty      percutaneious transluminal  . Tonsillectomy    . Bladder surgery    . Lumbar disc surgery  1/08  . Transurethral resection of bladder tumor    . Lumbar laminectomy/decompression microdiscectomy  07/09/2011    Procedure: LUMBAR LAMINECTOMY/DECOMPRESSION MICRODISCECTOMY;  Surgeon: Johnn Hai;  Location: [facility];  Service: Orthopedics;  Laterality: Left;  Decompression L5 - S1 on the Left and repair of dura  . Back surgery    . Lumbar laminectomy/decompression microdiscectomy N/A 07/27/2013    Procedure: DECOMPRESSION L4 -  L5 WITH EXCISION OF SYNOVIAL CYST AND, LATERAL MASS FUSION 1 LEVEL;  Surgeon: Johnn Hai, MD;  Location: [facility];  Service: Orthopedics;  Laterality: N/A;    reports that he quit smoking about 28 years ago. His smoking use included Cigarettes. He has a 22.5 pack-year smoking history. He has never used smokeless tobacco. He reports that he does not drink alcohol or use illicit drugs. family history includes Cancer in his brother, father, and sister; Diabetes in his sister; Heart attack in his mother. Allergies  Allergen Reactions  . Hydrochlorothiazide     REACTION: hyponatremia with daily use  . Lisinopril     10/20/13 cough  . Morphine And Related     Migraine   . Sulfonamide Derivatives     REACTION: rash   Current  Outpatient Prescriptions on File Prior to Visit  Medication Sig Dispense Refill  . amLODipine (NORVASC) 5 MG tablet Take 1 tablet (5 mg total) by mouth every morning.  90 tablet  3  . Ascorbic Acid (VITAMIN C) 500 MG tablet Take 500 mg by mouth 2 (two) times daily.       . cetirizine (ZYRTEC) 10 MG tablet Take 10 mg by mouth daily as needed for allergies. As needed      . citalopram (CELEXA) 10 MG tablet Take 10 mg by mouth every morning.      . docusate sodium (COLACE) 100 MG capsule Take 1 capsule (100 mg total) by mouth 2 (two) times daily.  60 capsule  0  . fluticasone (FLONASE) 50 MCG/ACT nasal spray Place 2 sprays into both nostrils daily as needed for rhinitis. Allergies  48 g  3  . gabapentin (NEURONTIN) 300 MG capsule TAKE 1 TO 2 CAPSULES FOUR TIMES DAILY - AFTER MEALS AND AT BEDTIME.  720 capsule  1  . hydrochlorothiazide (HYDRODIURIL) 25 MG tablet Take 6.25 mg by mouth daily as needed (Takes only if BP > 180/90).      Marland Kitchen HYDROcodone-acetaminophen (NORCO) 5-325 MG per tablet Take 1-2 tablets by mouth every 6 (six) hours as needed for moderate pain or severe pain.  60 tablet  0  . hydrocortisone (ANUSOL-HC) 25 MG suppository Place 25 mg rectally 2 (two) times daily as needed for hemorrhoids. Itching      . losartan (COZAAR) 50 MG tablet Take 1 tablet (50 mg total) by mouth daily.  90 tablet  3  . methylcellulose (ARTIFICIAL TEARS) 1 % ophthalmic solution Place 1 drop into both eyes 2 (two) times daily as needed (dry eyes).       . nitroGLYCERIN (NITROSTAT) 0.4 MG SL tablet Place 1 tablet (0.4 mg total) under the tongue every 5 (five) minutes as needed. Chest pain  60 tablet  5  . omeprazole (PRILOSEC) 20 MG capsule TAKE 2 CAPSULES (40 MG TOTAL)  DAILY  180 capsule  3  . oxyCODONE-acetaminophen (PERCOCET/ROXICET) 5-325 MG per tablet       . pimecrolimus (ELIDEL) 1 % cream Apply 1 application topically 2 (two) times daily.      . primidone (MYSOLINE) 250 MG tablet Take 0.5 tablets (125 mg  total) by mouth 3 (three) times daily.  360 tablet  3  . simvastatin (ZOCOR) 40 MG tablet Take 1.5 tablets (60 mg total) by mouth at bedtime.  90 tablet  3  . tamsulosin (FLOMAX) 0.4 MG CAPS capsule Take 0.4 mg by mouth daily.      . Travoprost, BAK Free, (TRAVATAN Z) 0.004 % SOLN ophthalmic solution  Place 1 drop into both eyes at bedtime.  5 mL  5   No current facility-administered medications on file prior to visit.   Review of Systems  Constitutional: Negative for unusual diaphoresis or other sweats  HENT: Negative for ringing in ear Eyes: Negative for double vision or worsening visual disturbance.  Respiratory: Negative for choking and stridor.   Gastrointestinal: Negative for vomiting or other signifcant bowel change Genitourinary: Negative for hematuria or decreased urine volume.  Musculoskeletal: Negative for other MSK pain or swelling Skin: Negative for color change and worsening wound.  Neurological: Negative for tremors and numbness other than noted  Psychiatric/Behavioral: Negative for decreased concentration or agitation other than above       Objective:   Physical Exam BP 132/82  Pulse 61  Temp(Src) 98.3 F (36.8 C) (Oral)  Wt 195 lb (88.451 kg)  SpO2 96% VS noted, non toxic Constitutional: Pt appears well-developed, well-nourished.  HENT: Head: NCAT.  Right Ear: External ear normal.  Left Ear: External ear normal.  Eyes: . Pupils are equal, round, and reactive to light. Conjunctivae and EOM are normal Neck: Normal range of motion. Neck supple.  Cardiovascular: Normal rate and regular rhythm.   Pulmonary/Chest: Effort normal and breath sounds decreased , no rales or wheezing  Abd:  Soft, NT, ND, + BS Spine nontender Neurological: Pt is alert. Not confused , motor grossly intact except 4+5 LLE strength Skin: Skin is warm. No rash but has bilat angular cheilits like cracking at edges of mouth, no thrush seen Psychiatric: Pt behavior is normal. No agitation.       Assessment & Plan:

## 2014-05-04 NOTE — Patient Instructions (Signed)
OK to take the percocet, and gabapentin as you are  Please take all new medication as prescribed - the cream for the cracking  Please call to let us know the name of the new orthopedic you want to see  You had the toradol (pain) shot today  Please continue all other medications as before, and refills have been done if requested.  Please have the pharmacy call with any other refills you may need.  Please keep your appointments with your specialists as you may have planned

## 2014-05-05 DIAGNOSIS — K130 Diseases of lips: Secondary | ICD-10-CM | POA: Insufficient documentation

## 2014-05-05 NOTE — Assessment & Plan Note (Signed)
S/p recent MRI, rec'd for ESI but frustrated he has not been able to accomplish this yet, I reassured him ok to take the percocet he has , since it is not nsaid related and not prone to bleeding ulcers. Cont all other med tx, and refer new ortho for second opinion per pt reqeust

## 2014-05-05 NOTE — Assessment & Plan Note (Signed)
stable overall by history and exam, recent data reviewed with pt, and pt to continue medical treatment as before,  to f/u any worsening symptoms or concerns BP Readings from Last 3 Encounters:  05/04/14 132/82  02/20/14 129/75  01/19/14 130/84

## 2014-05-05 NOTE — Assessment & Plan Note (Addendum)
stable overall by history and exam after recent predpack, recent data reviewed with pt, and pt to continue medical treatment as before,  to f/u any worsening symptoms or concerns, should be ok to have ESI done if needed  Lab Results  Component Value Date   HGBA1C 5.7 10/28/2012

## 2014-05-05 NOTE — Assessment & Plan Note (Signed)
Ok for [employer] cr prn, consider nystatin soln oral if not improved, consider check b12

## 2014-05-19 DIAGNOSIS — M5416 Radiculopathy, lumbar region: Secondary | ICD-10-CM | POA: Diagnosis not present

## 2014-05-19 DIAGNOSIS — M961 Postlaminectomy syndrome, not elsewhere classified: Secondary | ICD-10-CM | POA: Diagnosis not present

## 2014-05-19 DIAGNOSIS — M4806 Spinal stenosis, lumbar region: Secondary | ICD-10-CM | POA: Diagnosis not present

## 2014-05-22 ENCOUNTER — Encounter: Payer: Self-pay | Admitting: Internal Medicine

## 2014-05-24 ENCOUNTER — Ambulatory Visit: Payer: Medicare Other | Admitting: Podiatrist

## 2014-05-30 DIAGNOSIS — M47817 Spondylosis without myelopathy or radiculopathy, lumbosacral region: Secondary | ICD-10-CM | POA: Diagnosis not present

## 2014-05-30 DIAGNOSIS — Z9889 Other specified postprocedural states: Secondary | ICD-10-CM | POA: Diagnosis not present

## 2014-05-30 DIAGNOSIS — M545 Low back pain: Secondary | ICD-10-CM | POA: Diagnosis not present

## 2014-05-30 DIAGNOSIS — M5416 Radiculopathy, lumbar region: Secondary | ICD-10-CM | POA: Diagnosis not present

## 2014-06-02 DIAGNOSIS — M961 Postlaminectomy syndrome, not elsewhere classified: Secondary | ICD-10-CM | POA: Diagnosis not present

## 2014-06-02 DIAGNOSIS — M4716 Other spondylosis with myelopathy, lumbar region: Secondary | ICD-10-CM | POA: Diagnosis not present

## 2014-06-05 DIAGNOSIS — R3912 Poor urinary stream: Secondary | ICD-10-CM | POA: Diagnosis not present

## 2014-06-05 DIAGNOSIS — Z8551 Personal history of malignant neoplasm of bladder: Secondary | ICD-10-CM | POA: Diagnosis not present

## 2014-06-05 DIAGNOSIS — N5201 Erectile dysfunction due to arterial insufficiency: Secondary | ICD-10-CM | POA: Diagnosis not present

## 2014-06-05 DIAGNOSIS — N139 Obstructive and reflux uropathy, unspecified: Secondary | ICD-10-CM | POA: Diagnosis not present

## 2014-06-06 ENCOUNTER — Other Ambulatory Visit: Payer: Self-pay | Admitting: Internal Medicine

## 2014-06-07 DIAGNOSIS — M4806 Spinal stenosis, lumbar region: Secondary | ICD-10-CM | POA: Diagnosis not present

## 2014-06-07 DIAGNOSIS — Z4789 Encounter for other orthopedic aftercare: Secondary | ICD-10-CM | POA: Diagnosis not present

## 2014-06-12 ENCOUNTER — Ambulatory Visit: Payer: Self-pay | Admitting: Orthopedic Surgery

## 2014-06-15 ENCOUNTER — Encounter: Payer: Self-pay | Admitting: Podiatrist

## 2014-06-15 ENCOUNTER — Ambulatory Visit (INDEPENDENT_AMBULATORY_CARE_PROVIDER_SITE_OTHER): Payer: Medicare Other | Admitting: Podiatrist

## 2014-06-15 DIAGNOSIS — M79676 Pain in unspecified toe(s): Secondary | ICD-10-CM

## 2014-06-15 DIAGNOSIS — B351 Tinea unguium: Secondary | ICD-10-CM | POA: Diagnosis not present

## 2014-06-15 NOTE — Progress Notes (Signed)
HPI:  Patient presents today for follow up of foot and nail care. Denies any new complaints today. He is having back surgery on Dec 24th at [facility].  Objective:  Patients chart is reviewed.  Vascular status reveals pedal pulses noted at 2 out of 4 dp and pt bilateral .  Neurological sensation is intact to [employer] monofilament bilateral.  Patients nails are thickened, discolored, distrophic, friable and brittle with yellow-brown discoloration. Patient subjectively relates they are painful with shoes and with ambulation of bilateral feet.  Assessment:  Symptomatic onychomycosis  Plan:  Discussed treatment options and alternatives.  The symptomatic toenails were debrided through manual an mechanical means without complication.  Return appointment recommended at routine intervals of 3 months

## 2014-06-16 ENCOUNTER — Telehealth: Payer: Self-pay | Admitting: Internal Medicine

## 2014-06-16 MED ORDER — TAMSULOSIN HCL 0.4 MG PO CAPS: 0.4000 mg | ORAL_CAPSULE | Freq: Every day | ORAL | Status: DC

## 2014-06-16 NOTE — Telephone Encounter (Signed)
Sent in to mail order and a #30 day supply to his local pharmacy

## 2014-06-16 NOTE — Telephone Encounter (Signed)
Pt states he needs refill: Tamsulosin (mail order) [facility] [facility]. He said he called pharm but told to call MD's office. Pt has 5 days left of this medication, pt takes two a day instead of one.

## 2014-06-20 ENCOUNTER — Ambulatory Visit: Payer: Self-pay | Admitting: Orthopedic Surgery

## 2014-06-20 NOTE — H&P (Signed)
[name] is an 78 y.o. male.   Chief Complaint: back and left leg pain HPI: The patient is a 78 year old male who presents today for follow up of their back. The patient is being followed for their low back symptoms. They are now 10 1/2 months out from lumbar decompression with lateral mass fusion. Symptoms reported today include: pain, numbness, leg pain (bilateral) and pain with standing, while the patient does not report symptoms of: stiffness. The patient states that they are doing poorly. Current treatment includes: relative rest, activity modification and pain medications. The following medication has been used for pain control: Percocet and gabapentin. The patient presents today following left L5 SNRB x 5 days. The patient reports the injection helped for one day.  He follows up. He had a selective nerve root block with two days of complete relief and now it has returned. This is severe, radiating down into the top of his foot in the L5 nerve root distribution. He continues to take Percocet. He is here with his wife. He otherwise feels healthy.  Past Medical History  Diagnosis Date  . Other chronic nonalcoholic liver disease   . Diverticulosis of colon (without mention of hemorrhage)   . Gastritis   . Esophagitis   . Abdominal tenderness, epigastric   . Bradycardia   . Chest pain   . Dizziness   . Hepatotoxicity     drug induced  . Bladder cancer   . Cough   . Colonic polyp   . Allergic rhinitis   . Renal insufficiency   . CAD (coronary artery disease)   . CHF (congestive heart failure)   . Lumbar disc disease   . PUD (peptic ulcer disease)   . Hiatal hernia     with reflux  . Rheumatoid arthritis(714.0)   . HTN (hypertension)   . HLD (hyperlipidemia)   . GERD (gastroesophageal reflux disease)   . Depression   . S/P angioplasty   . Tremors of nervous system     Past Surgical History  Procedure Laterality Date  . Adenoidectomy    . Coronary angioplasty       percutaneious transluminal  . Tonsillectomy    . Bladder surgery    . Lumbar disc surgery  1/08  . Transurethral resection of bladder tumor    . Lumbar laminectomy/decompression microdiscectomy  07/09/2011    Procedure: LUMBAR LAMINECTOMY/DECOMPRESSION MICRODISCECTOMY;  Surgeon: Jeffrey C Beane;  Location: [facility];  Service: Orthopedics;  Laterality: Left;  Decompression L5 - S1 on the Left and repair of dura  . Back surgery    . Lumbar laminectomy/decompression microdiscectomy N/A 07/27/2013    Procedure: DECOMPRESSION L4 - L5 WITH EXCISION OF SYNOVIAL CYST AND, LATERAL MASS FUSION 1 LEVEL;  Surgeon: Jeffrey C Beane, MD;  Location: [facility];  Service: Orthopedics;  Laterality: N/A;    Family History  Problem Relation Age of Onset  . Heart attack Mother   . Cancer Father     lung  . Cancer Sister     lung  . Diabetes Sister   . Cancer Brother     lung that spread to brain   Social History:  reports that he quit smoking about 28 years ago. His smoking use included Cigarettes. He has a 22.5 pack-year smoking history. He has never used smokeless tobacco. He reports that he does not drink alcohol or use illicit drugs.  Allergies:  Allergies  Allergen Reactions  . Hydrochlorothiazide     REACTION:   hyponatremia with daily use  . Lisinopril     10/20/13 cough  . Morphine And Related     Migraine   . Sulfonamide Derivatives     REACTION: rash     (Not in a hospital admission)  No results found for this or any previous visit (from the past 48 hour(s)). No results found.  Review of Systems  Constitutional: Negative.   HENT: Negative.   Eyes: Negative.   Respiratory: Negative.   Cardiovascular: Negative.   Gastrointestinal: Negative.   Genitourinary: Negative.   Musculoskeletal: Positive for back pain.  Skin: Negative.   Neurological: Positive for sensory change and focal weakness.  Psychiatric/Behavioral: Negative.     There were no vitals taken for this visit. Physical  Exam  Constitutional: He is oriented to person, place, and time. He appears well-developed and well-nourished.  HENT:  Head: Normocephalic and atraumatic.  Eyes: Conjunctivae and EOM are normal. Pupils are equal, round, and reactive to light.  Neck: Normal range of motion. Neck supple.  Cardiovascular: Normal rate and regular rhythm.   Respiratory: Effort normal and breath sounds normal.  GI: Soft. Bowel sounds are normal.  Musculoskeletal:  Lumbar spine exam reveals no evidence of soft tissue swelling, no evidence of soft tissue swelling or deformity or skin ecchymosis. On palpation there is no tenderness of the lumbar spine. No flank pain with percussion. The abdomen is soft and nontender. Nontender over the trochanters. No cellulitis or lymphadenopathy.  Straight leg raise produces marked buttock, thigh and calf pain. This is exacerbated with dorsal augmentation maneuver. EHL is 4+/5. Altered sensation in the L5 dermatome. . The patient is normoreflexic. There is no Babinski or clonus. Sensory exam is intact to light touch. The patient has good distal pulses. No DVT. No pain and normal range of motion without instability of the hips, knees and ankle   Neurological: He is alert and oriented to person, place, and time. He has normal reflexes.  Skin: Skin is warm and dry.  Psychiatric: He has a normal mood and affect.    MRI demonstrates a severe foraminal stenosis on the left at L5 secondary to facet hypertrophy and disk degeneration.  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 Diagnostic temporary relief from an L5 selective nerve root block requiring chronic analgesics with neural tension signs and EHL weakness.  We had a discussion concerning the pathology, relative anatomy and treatment options. We discussed living with his symptoms and pain management. We di sussed a foraminotomy or a decompression at L5-S1 on the left. He may  require a hemilaminectomy for his disk degeneration. There is minimal back pain. I would not recommend a fusion as it would require a fusion into the L4-5 region where he has had surgery in the past. This was for his synovial cyst.  I had an extensive discussion of the risks and benefits of the lumbar decompression with the patient including bleeding, infection, damage to neurovascular structures, epidural fibrosis, CSF leak requiring repair. We also discussed increase in pain, adjacent segment disease, recurrent disc herniation, need for future surgery including repeat decompression and/or fusion. We also discussed risks of postoperative hematoma, paralysis, anesthetic complications including DVT, PE, death, cardiopulmonary dysfunction. In addition, the perioperative and postoperative courses were discussed in detail including the rehabilitative time and return to functional activity and work. I provided the patient with an illustrated handout and utilized the appropriate surgical models.  I did indicate a salvage procedure we would   consider a two level fusion. Again, at this point in time I do not feel that is indicate. He does have underlying calcification of his aortic vessels. This is a concern as well. He has otherwise been healthy with no chest pain, shortness or breath or difficulty with urination. No fevers or chills.  I reviewed the MRI and his X-rays with him in detail.  I refilled his Oxycodone. We will schedule him for after the holidays. I do not feel another selective nerve root block would be particularly beneficial to him. We spent considerable time discussing these issues.  Plan microlumbar decompression L5-S1 left, possible L4-5  Tanish Prien M. PA-C for Dr. Beane 06/20/2014, 1:01 PM    

## 2014-06-29 ENCOUNTER — Inpatient Hospital Stay (HOSPITAL_COMMUNITY): Admission: RE | Admit: 2014-06-29 | Payer: Medicare Other | Source: Ambulatory Visit

## 2014-06-30 ENCOUNTER — Encounter (HOSPITAL_COMMUNITY)
Admission: RE | Admit: 2014-06-30 | Discharge: 2014-06-30 | Disposition: A | Discharge status: Home / Self Care | Payer: Medicare Other | Source: Ambulatory Visit | Attending: Specialist | Admitting: Specialist

## 2014-06-30 ENCOUNTER — Encounter (HOSPITAL_COMMUNITY): Payer: Self-pay

## 2014-06-30 ENCOUNTER — Ambulatory Visit (HOSPITAL_COMMUNITY)
Admission: RE | Admit: 2014-06-30 | Discharge: 2014-06-30 | Disposition: A | Discharge status: Home / Self Care | Payer: Medicare Other | Source: Ambulatory Visit | Attending: Orthopedic Surgery | Admitting: Orthopedic Surgery

## 2014-06-30 ENCOUNTER — Ambulatory Visit (HOSPITAL_COMMUNITY)
Admission: RE | Admit: 2014-06-30 | Discharge: 2014-06-30 | Disposition: A | Discharge status: Home / Self Care | Payer: Medicare Other | Source: Ambulatory Visit | Attending: Anesthesiology | Admitting: Anesthesiology

## 2014-06-30 DIAGNOSIS — R001 Bradycardia, unspecified: Secondary | ICD-10-CM | POA: Insufficient documentation

## 2014-06-30 DIAGNOSIS — I10 Essential (primary) hypertension: Secondary | ICD-10-CM

## 2014-06-30 DIAGNOSIS — M48061 Spinal stenosis, lumbar region without neurogenic claudication: Secondary | ICD-10-CM

## 2014-06-30 DIAGNOSIS — M4186 Other forms of scoliosis, lumbar region: Secondary | ICD-10-CM | POA: Insufficient documentation

## 2014-06-30 DIAGNOSIS — M4806 Spinal stenosis, lumbar region: Secondary | ICD-10-CM | POA: Insufficient documentation

## 2014-06-30 DIAGNOSIS — I700 Atherosclerosis of aorta: Secondary | ICD-10-CM | POA: Diagnosis not present

## 2014-06-30 DIAGNOSIS — Z01818 Encounter for other preprocedural examination: Secondary | ICD-10-CM | POA: Diagnosis not present

## 2014-06-30 LAB — BASIC METABOLIC PANEL
Anion gap: 13 (ref 5–15)
BUN: 9 mg/dL (ref 6–23)
CO2: 27 meq/L (ref 19–32)
Calcium: 9.7 mg/dL (ref 8.4–10.5)
Chloride: 90 mEq/L — ABNORMAL LOW (ref 96–112)
Creatinine, Ser: 0.8 mg/dL (ref 0.50–1.35)
GFR calc non Af Amer: 85 mL/min — ABNORMAL LOW (ref >90)
Glucose, Bld: 116 mg/dL — ABNORMAL HIGH (ref 70–99)
POTASSIUM: 3.2 meq/L — AB (ref 3.7–5.3)
Sodium: 130 mEq/L — ABNORMAL LOW (ref 137–147)

## 2014-06-30 LAB — CBC
HEMATOCRIT: 45.9 % (ref 39.0–52.0)
Hemoglobin: 15.9 g/dL (ref 13.0–17.0)
MCH: 31.2 pg (ref 26.0–34.0)
MCHC: 34.6 g/dL (ref 30.0–36.0)
MCV: 90.2 fL (ref 78.0–100.0)
Platelets: 255 10*3/uL (ref 150–400)
RBC: 5.09 MIL/uL (ref 4.22–5.81)
RDW: 12.1 % (ref 11.5–15.5)
WBC: 6.3 10*3/uL (ref 4.0–10.5)

## 2014-06-30 LAB — SURGICAL PCR SCREEN
MRSA, PCR: NEGATIVE
STAPHYLOCOCCUS AUREUS: POSITIVE — AB

## 2014-06-30 NOTE — Patient Instructions (Signed)
[name]  06/30/2014   Your procedure is scheduled on: Thursday 07/06/2014  Report to [facility]  Entrance and follow signs to               [place] at 0800 AM.  Call this number if you have problems the morning of surgery [phone]   Remember:  Do not eat food or drink liquids :After Midnight.     Take these medicines the morning of surgery with A SIP OF WATER: Amlodipine, Citalopram, Gabapentin, Omeprazole,Primidone, use eye drops as needed                               You may not have any metal on your body including hair pins and              piercings  Do not wear jewelry, make-up, lotions, powders or perfumes.             Do not wear nail polish.  Do not shave  48 hours prior to surgery.              Men may shave face and neck.   Do not bring valuables to the hospital. [place].  Contacts, dentures or bridgework may not be worn into surgery.  Leave suitcase in the car. After surgery it may be brought to your room.     Patients discharged the day of surgery will not be allowed to drive home.  Name and phone number of your driver:  Special Instructions: N/A              Please read over the following fact sheets you were given: _____________________________________________________________________             [facility] - Preparing for Surgery Before surgery, you can play an important role.  Because skin is not sterile, your skin needs to be as free of germs as possible.  You can reduce the number of germs on your skin by washing with CHG (chlorahexidine gluconate) soap before surgery.  CHG is an antiseptic cleaner which kills germs and bonds with the skin to continue killing germs even after washing. Please DO NOT use if you have an allergy to CHG or antibacterial soaps.  If your skin becomes reddened/irritated stop using the CHG and inform your nurse when you arrive at Short  Stay. Do not shave (including legs and underarms) for at least 48 hours prior to the first CHG shower.  You may shave your face/neck. Please follow these instructions carefully:  1.  Shower with CHG Soap the night before surgery and the  morning of Surgery.  2.  If you choose to wash your hair, wash your hair first as usual with your  normal  shampoo.  3.  After you shampoo, rinse your hair and body thoroughly to remove the  shampoo.                           4.  Use CHG as you would any other liquid soap.  You can apply chg directly  to the skin and wash  Gently with a scrungie or clean washcloth.  5.  Apply the CHG Soap to your body ONLY FROM THE NECK DOWN.   Do not use on face/ open                           Wound or open sores. Avoid contact with eyes, ears mouth and genitals (private parts).                       Wash face,  Genitals (private parts) with your normal soap.             6.  Wash thoroughly, paying special attention to the area where your surgery  will be performed.  7.  Thoroughly rinse your body with warm water from the neck down.  8.  DO NOT shower/wash with your normal soap after using and rinsing off  the CHG Soap.                9.  Pat yourself dry with a clean towel.            10.  Wear clean pajamas.            11.  Place clean sheets on your bed the night of your first shower and do not  sleep with pets. Day of Surgery : Do not apply any lotions/deodorants the morning of surgery.  Please wear clean clothes to the hospital/surgery center.  FAILURE TO FOLLOW THESE INSTRUCTIONS MAY RESULT IN THE CANCELLATION OF YOUR SURGERY PATIENT SIGNATURE_________________________________  NURSE SIGNATURE__________________________________  ________________________________________________________________________   Adam Phenix  An incentive spirometer is a tool that can help keep your lungs clear and active. This tool measures how well you are  filling your lungs with each breath. Taking long deep breaths may help reverse or decrease the chance of developing breathing (pulmonary) problems (especially infection) following:  A long period of time when you are unable to move or be active. BEFORE THE PROCEDURE   If the spirometer includes an indicator to show your best effort, your nurse or respiratory therapist will set it to a desired goal.  If possible, sit up straight or lean slightly forward. Try not to slouch.  Hold the incentive spirometer in an upright position. INSTRUCTIONS FOR USE  1. Sit on the edge of your bed if possible, or sit up as far as you can in bed or on a chair. 2. Hold the incentive spirometer in an upright position. 3. Breathe out normally. 4. Place the mouthpiece in your mouth and seal your lips tightly around it. 5. Breathe in slowly and as deeply as possible, raising the piston or the ball toward the top of the column. 6. Hold your breath for 3-5 seconds or for as long as possible. Allow the piston or ball to fall to the bottom of the column. 7. Remove the mouthpiece from your mouth and breathe out normally. 8. Rest for a few seconds and repeat Steps 1 through 7 at least 10 times every 1-2 hours when you are awake. Take your time and take a few normal breaths between deep breaths. 9. The spirometer may include an indicator to show your best effort. Use the indicator as a goal to work toward during each repetition. 10. After each set of 10 deep breaths, practice coughing to be sure your lungs are clear. If you have an incision (the cut made at the time of surgery),  support your incision when coughing by placing a pillow or rolled up towels firmly against it. Once you are able to get out of bed, walk around indoors and cough well. You may stop using the incentive spirometer when instructed by your caregiver.  RISKS AND COMPLICATIONS  Take your time so you do not get dizzy or light-headed.  If you are in pain,  you may need to take or ask for pain medication before doing incentive spirometry. It is harder to take a deep breath if you are having pain. AFTER USE  Rest and breathe slowly and easily.  It can be helpful to keep track of a log of your progress. Your caregiver can provide you with a simple table to help with this. If you are using the spirometer at home, follow these instructions: [place] IF:   You are having difficultly using the spirometer.  You have trouble using the spirometer as often as instructed.  Your pain medication is not giving enough relief while using the spirometer.  You develop fever of 100.5 F (38.1 C) or higher. SEEK IMMEDIATE MEDICAL CARE IF:   You cough up bloody sputum that had not been present before.  You develop fever of 102 F (38.9 C) or greater.  You develop worsening pain at or near the incision site. MAKE SURE YOU:   Understand these instructions.  Will watch your condition.  Will get help right away if you are not doing well or get worse. Document Released: 11/10/2006 Document Revised: 09/22/2011 Document Reviewed: 01/11/2007 [facility] Patient Information 2014 [place], [place].   ________________________________________________________________________

## 2014-06-30 NOTE — Progress Notes (Signed)
BMp results done 06/30/14 faxed via EPIC to Dr Tonita Cong.

## 2014-06-30 NOTE — Progress Notes (Signed)
   06/30/14 1422  OBSTRUCTIVE SLEEP APNEA  Have you ever been diagnosed with sleep apnea through a sleep study? No  Do you snore loudly (loud enough to be heard through closed doors)?  0  Do you often feel tired, fatigued, or sleepy during the daytime? 1  Has anyone observed you stop breathing during your sleep? 0  Do you have, or are you being treated for high blood pressure? 1  BMI more than 35 kg/m2? 0  Age over 76 years old? 1  Neck circumference greater than 40 cm/16 inches? 1  Gender: 1  Obstructive Sleep Apnea Score 5  Score 4 or greater  Results sent to PCP

## 2014-07-03 ENCOUNTER — Ambulatory Visit: Payer: Self-pay | Admitting: Orthopedic Surgery

## 2014-07-03 ENCOUNTER — Telehealth: Payer: Self-pay | Admitting: *Deleted

## 2014-07-03 DIAGNOSIS — G4730 Sleep apnea, unspecified: Secondary | ICD-10-CM

## 2014-07-03 NOTE — Telephone Encounter (Signed)
Ok for robin to contact pt; screening test indicates ? Of sleep apnea       Can I refer to pulmonary for furhter evaluation?    ----- Message -----    From: Gerre Couch, RN    Sent: 06/30/2014  2:24 PM     To: Biagio Borg, MD   Subject: Inpatient Notes                     Your patient has screened as at an elevated risk for Obstructive Sleep Apnea using the STOP-BANG tool during a pre-surgical visit. A score of 4 or greater is an elevated risk.      Tried calling pt concerning results back on his Sleep study no answer LMOM RTC....[name]

## 2014-07-03 NOTE — Telephone Encounter (Signed)
done

## 2014-07-03 NOTE — Telephone Encounter (Signed)
Pt return call bck gave him results back from the sleep study. Pt states yes he would like to see pulmonologist.../lmb

## 2014-07-06 ENCOUNTER — Ambulatory Visit (HOSPITAL_COMMUNITY)
Admission: RE | Admit: 2014-07-06 | Discharge: 2014-07-07 | Disposition: A | Discharge status: Home / Self Care | Payer: Medicare Other | Source: Ambulatory Visit | Attending: Specialist | Admitting: Specialist

## 2014-07-06 ENCOUNTER — Ambulatory Visit (HOSPITAL_COMMUNITY): Payer: Medicare Other

## 2014-07-06 ENCOUNTER — Encounter (HOSPITAL_COMMUNITY)
Admission: RE | Disposition: A | Discharge status: Home / Self Care | Payer: Self-pay | Source: Ambulatory Visit | Attending: Specialist

## 2014-07-06 ENCOUNTER — Encounter (HOSPITAL_COMMUNITY): Payer: Self-pay | Admitting: *Deleted

## 2014-07-06 ENCOUNTER — Ambulatory Visit (HOSPITAL_COMMUNITY): Payer: Medicare Other | Admitting: Anesthesiology

## 2014-07-06 DIAGNOSIS — Z419 Encounter for procedure for purposes other than remedying health state, unspecified: Secondary | ICD-10-CM

## 2014-07-06 DIAGNOSIS — F329 Major depressive disorder, single episode, unspecified: Secondary | ICD-10-CM | POA: Insufficient documentation

## 2014-07-06 DIAGNOSIS — M4806 Spinal stenosis, lumbar region: Secondary | ICD-10-CM | POA: Diagnosis not present

## 2014-07-06 DIAGNOSIS — Z86010 Personal history of colonic polyps: Secondary | ICD-10-CM | POA: Insufficient documentation

## 2014-07-06 DIAGNOSIS — I509 Heart failure, unspecified: Secondary | ICD-10-CM | POA: Diagnosis not present

## 2014-07-06 DIAGNOSIS — Z882 Allergy status to sulfonamides status: Secondary | ICD-10-CM | POA: Diagnosis not present

## 2014-07-06 DIAGNOSIS — E785 Hyperlipidemia, unspecified: Secondary | ICD-10-CM | POA: Insufficient documentation

## 2014-07-06 DIAGNOSIS — I10 Essential (primary) hypertension: Secondary | ICD-10-CM | POA: Diagnosis not present

## 2014-07-06 DIAGNOSIS — Z885 Allergy status to narcotic agent status: Secondary | ICD-10-CM | POA: Insufficient documentation

## 2014-07-06 DIAGNOSIS — Z6831 Body mass index (BMI) 31.0-31.9, adult: Secondary | ICD-10-CM | POA: Diagnosis not present

## 2014-07-06 DIAGNOSIS — Z888 Allergy status to other drugs, medicaments and biological substances status: Secondary | ICD-10-CM | POA: Insufficient documentation

## 2014-07-06 DIAGNOSIS — Z8551 Personal history of malignant neoplasm of bladder: Secondary | ICD-10-CM | POA: Insufficient documentation

## 2014-07-06 DIAGNOSIS — I2510 Atherosclerotic heart disease of native coronary artery without angina pectoris: Secondary | ICD-10-CM | POA: Diagnosis not present

## 2014-07-06 DIAGNOSIS — Z8711 Personal history of peptic ulcer disease: Secondary | ICD-10-CM | POA: Insufficient documentation

## 2014-07-06 DIAGNOSIS — E669 Obesity, unspecified: Secondary | ICD-10-CM | POA: Diagnosis not present

## 2014-07-06 DIAGNOSIS — K219 Gastro-esophageal reflux disease without esophagitis: Secondary | ICD-10-CM | POA: Diagnosis not present

## 2014-07-06 DIAGNOSIS — M069 Rheumatoid arthritis, unspecified: Secondary | ICD-10-CM | POA: Insufficient documentation

## 2014-07-06 DIAGNOSIS — Z87891 Personal history of nicotine dependence: Secondary | ICD-10-CM | POA: Diagnosis not present

## 2014-07-06 DIAGNOSIS — M48061 Spinal stenosis, lumbar region without neurogenic claudication: Secondary | ICD-10-CM | POA: Diagnosis present

## 2014-07-06 DIAGNOSIS — M5136 Other intervertebral disc degeneration, lumbar region: Secondary | ICD-10-CM | POA: Diagnosis not present

## 2014-07-06 DIAGNOSIS — Z9889 Other specified postprocedural states: Secondary | ICD-10-CM | POA: Diagnosis not present

## 2014-07-06 DIAGNOSIS — Z955 Presence of coronary angioplasty implant and graft: Secondary | ICD-10-CM | POA: Diagnosis not present

## 2014-07-06 HISTORY — PX: LUMBAR LAMINECTOMY/DECOMPRESSION MICRODISCECTOMY: SHX5026

## 2014-07-06 SURGERY — LUMBAR LAMINECTOMY/DECOMPRESSION MICRODISCECTOMY
Anesthesia: General | Laterality: Left

## 2014-07-06 MED ORDER — GLYCOPYRROLATE 0.2 MG/ML IJ SOLN
INTRAMUSCULAR | Status: AC
  Filled 2014-07-06: qty 3

## 2014-07-06 MED ORDER — ROCURONIUM BROMIDE 100 MG/10ML IV SOLN
INTRAVENOUS | Status: AC
  Filled 2014-07-06: qty 1

## 2014-07-06 MED ORDER — METHOCARBAMOL 1000 MG/10ML IJ SOLN
500.0000 mg | Freq: Four times a day (QID) | INTRAMUSCULAR | Status: DC | PRN
  Administered 2014-07-06: 500 mg via INTRAVENOUS
  Filled 2014-07-06 (×2): qty 5

## 2014-07-06 MED ORDER — LORATADINE 10 MG PO TABS
10.0000 mg | ORAL_TABLET | Freq: Every day | ORAL | Status: DC
  Administered 2014-07-07: 10 mg via ORAL
  Filled 2014-07-06: qty 1

## 2014-07-06 MED ORDER — SODIUM CHLORIDE 0.9 % IV SOLN: 250.0000 mL | INTRAVENOUS | Status: DC

## 2014-07-06 MED ORDER — LIDOCAINE HCL (CARDIAC) 20 MG/ML IV SOLN
INTRAVENOUS | Status: AC
  Filled 2014-07-06: qty 5

## 2014-07-06 MED ORDER — THROMBIN 5000 UNITS EX SOLR
CUTANEOUS | Status: DC | PRN
  Administered 2014-07-06: 5000 [IU] via TOPICAL

## 2014-07-06 MED ORDER — CLINDAMYCIN PHOSPHATE 900 MG/50ML IV SOLN
900.0000 mg | INTRAVENOUS | Status: AC
  Administered 2014-07-06: 900 mg via INTRAVENOUS

## 2014-07-06 MED ORDER — SODIUM CHLORIDE 0.9 % IJ SOLN: 3.0000 mL | Freq: Two times a day (BID) | INTRAMUSCULAR | Status: DC

## 2014-07-06 MED ORDER — FENTANYL CITRATE 0.05 MG/ML IJ SOLN
INTRAMUSCULAR | Status: AC
  Filled 2014-07-06: qty 2

## 2014-07-06 MED ORDER — HYDROCHLOROTHIAZIDE 25 MG PO TABS
25.0000 mg | ORAL_TABLET | Freq: Every morning | ORAL | Status: DC
  Administered 2014-07-07: 25 mg via ORAL
  Filled 2014-07-06 (×2): qty 1

## 2014-07-06 MED ORDER — GLYCOPYRROLATE 0.2 MG/ML IJ SOLN
INTRAMUSCULAR | Status: DC | PRN
  Administered 2014-07-06: 0.6 mg via INTRAVENOUS

## 2014-07-06 MED ORDER — ACETAMINOPHEN 650 MG RE SUPP: 650.0000 mg | RECTAL | Status: DC | PRN

## 2014-07-06 MED ORDER — SODIUM CHLORIDE 0.9 % IJ SOLN: 3.0000 mL | INTRAMUSCULAR | Status: DC | PRN

## 2014-07-06 MED ORDER — ONDANSETRON HCL 4 MG/2ML IJ SOLN
INTRAMUSCULAR | Status: DC | PRN
  Administered 2014-07-06: 4 mg via INTRAVENOUS

## 2014-07-06 MED ORDER — MENTHOL 3 MG MT LOZG
1.0000 | LOZENGE | OROMUCOSAL | Status: DC | PRN
  Filled 2014-07-06: qty 9

## 2014-07-06 MED ORDER — SENNOSIDES-DOCUSATE SODIUM 8.6-50 MG PO TABS: 1.0000 | ORAL_TABLET | Freq: Every evening | ORAL | Status: DC | PRN

## 2014-07-06 MED ORDER — PROPOFOL 10 MG/ML IV BOLUS
INTRAVENOUS | Status: DC | PRN
  Administered 2014-07-06: 170 mg via INTRAVENOUS

## 2014-07-06 MED ORDER — LATANOPROST 0.005 % OP SOLN
1.0000 [drp] | Freq: Every day | OPHTHALMIC | Status: DC
  Administered 2014-07-06: 1 [drp] via OPHTHALMIC
  Filled 2014-07-06: qty 2.5

## 2014-07-06 MED ORDER — ROCURONIUM BROMIDE 100 MG/10ML IV SOLN
INTRAVENOUS | Status: DC | PRN
  Administered 2014-07-06: 30 mg via INTRAVENOUS

## 2014-07-06 MED ORDER — OXYCODONE-ACETAMINOPHEN 5-325 MG PO TABS: 1.0000 | ORAL_TABLET | ORAL | Status: DC | PRN

## 2014-07-06 MED ORDER — HYDROMORPHONE HCL 1 MG/ML IJ SOLN
INTRAMUSCULAR | Status: AC
  Filled 2014-07-06: qty 1

## 2014-07-06 MED ORDER — SUCCINYLCHOLINE CHLORIDE 20 MG/ML IJ SOLN
INTRAMUSCULAR | Status: DC | PRN
  Administered 2014-07-06: 100 mg via INTRAVENOUS

## 2014-07-06 MED ORDER — RISAQUAD PO CAPS
1.0000 | ORAL_CAPSULE | Freq: Every day | ORAL | Status: DC
  Administered 2014-07-06 – 2014-07-07 (×2): 1 via ORAL
  Filled 2014-07-06 (×2): qty 1

## 2014-07-06 MED ORDER — SODIUM CHLORIDE 0.9 % IR SOLN
Status: DC | PRN
  Administered 2014-07-06: 11:00:00

## 2014-07-06 MED ORDER — CITALOPRAM HYDROBROMIDE 10 MG PO TABS
10.0000 mg | ORAL_TABLET | Freq: Every day | ORAL | Status: DC
  Administered 2014-07-07: 10 mg via ORAL
  Filled 2014-07-06: qty 1

## 2014-07-06 MED ORDER — PRIMIDONE 250 MG PO TABS
125.0000 mg | ORAL_TABLET | Freq: Three times a day (TID) | ORAL | Status: DC
  Administered 2014-07-06 (×2): 125 mg via ORAL
  Filled 2014-07-06 (×5): qty 1

## 2014-07-06 MED ORDER — CLINDAMYCIN PHOSPHATE 900 MG/50ML IV SOLN
INTRAVENOUS | Status: AC
  Filled 2014-07-06: qty 50

## 2014-07-06 MED ORDER — LACTATED RINGERS IV SOLN: INTRAVENOUS | Status: DC

## 2014-07-06 MED ORDER — NITROGLYCERIN 0.4 MG SL SUBL: 0.4000 mg | SUBLINGUAL_TABLET | SUBLINGUAL | Status: DC | PRN

## 2014-07-06 MED ORDER — PROMETHAZINE HCL 25 MG/ML IJ SOLN: 6.2500 mg | INTRAMUSCULAR | Status: DC | PRN

## 2014-07-06 MED ORDER — FLEET ENEMA 7-19 GM/118ML RE ENEM: 1.0000 | ENEMA | Freq: Once | RECTAL | Status: AC | PRN

## 2014-07-06 MED ORDER — METHOCARBAMOL 500 MG PO TABS
500.0000 mg | ORAL_TABLET | Freq: Four times a day (QID) | ORAL | Status: DC | PRN
  Administered 2014-07-06 – 2014-07-07 (×2): 500 mg via ORAL
  Filled 2014-07-06 (×2): qty 1

## 2014-07-06 MED ORDER — ACETAMINOPHEN 325 MG PO TABS: 650.0000 mg | ORAL_TABLET | ORAL | Status: DC | PRN

## 2014-07-06 MED ORDER — PHENYLEPHRINE HCL 10 MG/ML IJ SOLN
INTRAMUSCULAR | Status: DC | PRN
  Administered 2014-07-06 (×2): 80 ug via INTRAVENOUS

## 2014-07-06 MED ORDER — FENTANYL CITRATE 0.05 MG/ML IJ SOLN
INTRAMUSCULAR | Status: DC | PRN
  Administered 2014-07-06: 50 ug via INTRAVENOUS

## 2014-07-06 MED ORDER — BUPIVACAINE-EPINEPHRINE (PF) 0.5% -1:200000 IJ SOLN
INTRAMUSCULAR | Status: AC
  Filled 2014-07-06: qty 30

## 2014-07-06 MED ORDER — DEXAMETHASONE SODIUM PHOSPHATE 10 MG/ML IJ SOLN
INTRAMUSCULAR | Status: AC
  Filled 2014-07-06: qty 1

## 2014-07-06 MED ORDER — SODIUM CHLORIDE 0.9 % IR SOLN
Status: AC
  Filled 2014-07-06: qty 1

## 2014-07-06 MED ORDER — VITAMIN C 500 MG PO TABS
500.0000 mg | ORAL_TABLET | Freq: Two times a day (BID) | ORAL | Status: DC
  Administered 2014-07-06 – 2014-07-07 (×3): 500 mg via ORAL
  Filled 2014-07-06 (×4): qty 1

## 2014-07-06 MED ORDER — DEXAMETHASONE SODIUM PHOSPHATE 10 MG/ML IJ SOLN
INTRAMUSCULAR | Status: DC | PRN
  Administered 2014-07-06: 10 mg via INTRAVENOUS

## 2014-07-06 MED ORDER — ALUM & MAG HYDROXIDE-SIMETH 200-200-20 MG/5ML PO SUSP: 30.0000 mL | Freq: Four times a day (QID) | ORAL | Status: DC | PRN

## 2014-07-06 MED ORDER — LOSARTAN POTASSIUM 50 MG PO TABS
50.0000 mg | ORAL_TABLET | Freq: Every day | ORAL | Status: DC
  Filled 2014-07-06: qty 1

## 2014-07-06 MED ORDER — PANTOPRAZOLE SODIUM 40 MG PO TBEC
40.0000 mg | DELAYED_RELEASE_TABLET | Freq: Every day | ORAL | Status: DC
  Administered 2014-07-06 – 2014-07-07 (×2): 40 mg via ORAL
  Filled 2014-07-06 (×2): qty 1

## 2014-07-06 MED ORDER — ONDANSETRON HCL 4 MG/2ML IJ SOLN: 4.0000 mg | INTRAMUSCULAR | Status: DC | PRN

## 2014-07-06 MED ORDER — HYDROMORPHONE HCL 1 MG/ML IJ SOLN
0.2500 mg | INTRAMUSCULAR | Status: DC | PRN
  Administered 2014-07-06 (×4): 0.5 mg via INTRAVENOUS

## 2014-07-06 MED ORDER — NEOSTIGMINE METHYLSULFATE 10 MG/10ML IV SOLN
INTRAVENOUS | Status: DC | PRN
  Administered 2014-07-06: 4 mg via INTRAVENOUS

## 2014-07-06 MED ORDER — PHENYLEPHRINE HCL 10 MG/ML IJ SOLN
10.0000 mg | INTRAMUSCULAR | Status: DC | PRN
  Administered 2014-07-06: 30 ug/min via INTRAVENOUS

## 2014-07-06 MED ORDER — GABAPENTIN 300 MG PO CAPS
300.0000 mg | ORAL_CAPSULE | Freq: Three times a day (TID) | ORAL | Status: DC
  Administered 2014-07-06 – 2014-07-07 (×3): 300 mg via ORAL
  Filled 2014-07-06 (×5): qty 1

## 2014-07-06 MED ORDER — KCL IN DEXTROSE-NACL 20-5-0.45 MEQ/L-%-% IV SOLN
INTRAVENOUS | Status: DC
  Administered 2014-07-06: 17:00:00 via INTRAVENOUS
  Filled 2014-07-06 (×2): qty 1000

## 2014-07-06 MED ORDER — NEOSTIGMINE METHYLSULFATE 10 MG/10ML IV SOLN
INTRAVENOUS | Status: AC
  Filled 2014-07-06: qty 1

## 2014-07-06 MED ORDER — THROMBIN 5000 UNITS EX SOLR
CUTANEOUS | Status: AC
  Filled 2014-07-06: qty 10000

## 2014-07-06 MED ORDER — HYDROCODONE-ACETAMINOPHEN 5-325 MG PO TABS
1.0000 | ORAL_TABLET | ORAL | Status: DC | PRN
  Administered 2014-07-06: 1 via ORAL
  Administered 2014-07-06 – 2014-07-07 (×3): 2 via ORAL
  Filled 2014-07-06: qty 2
  Filled 2014-07-06: qty 1
  Filled 2014-07-06 (×2): qty 2

## 2014-07-06 MED ORDER — BISACODYL 5 MG PO TBEC: 5.0000 mg | DELAYED_RELEASE_TABLET | Freq: Every day | ORAL | Status: DC | PRN

## 2014-07-06 MED ORDER — CEFAZOLIN SODIUM-DEXTROSE 2-3 GM-% IV SOLR
INTRAVENOUS | Status: AC
  Filled 2014-07-06: qty 50

## 2014-07-06 MED ORDER — HYDROMORPHONE HCL 1 MG/ML IJ SOLN: 0.5000 mg | INTRAMUSCULAR | Status: DC | PRN

## 2014-07-06 MED ORDER — BUPIVACAINE-EPINEPHRINE 0.5% -1:200000 IJ SOLN
INTRAMUSCULAR | Status: DC | PRN
  Administered 2014-07-06: 10 mL

## 2014-07-06 MED ORDER — PHENOL 1.4 % MT LIQD
1.0000 | OROMUCOSAL | Status: DC | PRN
  Filled 2014-07-06: qty 177

## 2014-07-06 MED ORDER — PROPOFOL 10 MG/ML IV BOLUS
INTRAVENOUS | Status: AC
  Filled 2014-07-06: qty 20

## 2014-07-06 MED ORDER — FLUTICASONE PROPIONATE 50 MCG/ACT NA SUSP
2.0000 | Freq: Every day | NASAL | Status: DC | PRN
  Filled 2014-07-06: qty 16

## 2014-07-06 MED ORDER — CEFAZOLIN SODIUM-DEXTROSE 2-3 GM-% IV SOLR
2.0000 g | Freq: Three times a day (TID) | INTRAVENOUS | Status: DC
  Administered 2014-07-06 – 2014-07-07 (×2): 2 g via INTRAVENOUS
  Filled 2014-07-06 (×3): qty 50

## 2014-07-06 MED ORDER — ONDANSETRON HCL 4 MG/2ML IJ SOLN
INTRAMUSCULAR | Status: AC
  Filled 2014-07-06: qty 2

## 2014-07-06 MED ORDER — LIDOCAINE HCL (CARDIAC) 20 MG/ML IV SOLN
INTRAVENOUS | Status: DC | PRN
  Administered 2014-07-06: 100 mg via INTRAVENOUS

## 2014-07-06 MED ORDER — DOCUSATE SODIUM 100 MG PO CAPS
100.0000 mg | ORAL_CAPSULE | Freq: Two times a day (BID) | ORAL | Status: DC
  Administered 2014-07-06 – 2014-07-07 (×3): 100 mg via ORAL

## 2014-07-06 MED ORDER — AMLODIPINE BESYLATE 5 MG PO TABS
5.0000 mg | ORAL_TABLET | Freq: Every day | ORAL | Status: DC
  Filled 2014-07-06: qty 1

## 2014-07-06 MED ORDER — DOCUSATE SODIUM 100 MG PO CAPS: 100.0000 mg | ORAL_CAPSULE | Freq: Two times a day (BID) | ORAL | Status: DC

## 2014-07-06 MED ORDER — CEFAZOLIN SODIUM-DEXTROSE 2-3 GM-% IV SOLR
2.0000 g | INTRAVENOUS | Status: AC
  Administered 2014-07-06: 2 g via INTRAVENOUS

## 2014-07-06 MED ORDER — LACTATED RINGERS IV SOLN
INTRAVENOUS | Status: DC
  Administered 2014-07-06: 1000 mL via INTRAVENOUS

## 2014-07-06 SURGICAL SUPPLY — 55 items
BAG ZIPLOCK 12X15 (MISCELLANEOUS) ×3 IMPLANT
BLADE SURG SZ10 CARB STEEL (BLADE) IMPLANT
CHLORAPREP W/TINT 26ML (MISCELLANEOUS) IMPLANT
CLEANER TIP ELECTROSURG 2X2 (MISCELLANEOUS) ×3 IMPLANT
CLOSURE WOUND 1/2 X4 (GAUZE/BANDAGES/DRESSINGS)
CLOTH 2% CHLOROHEXIDINE 3PK (PERSONAL CARE ITEMS) ×3 IMPLANT
DECANTER SPIKE VIAL GLASS SM (MISCELLANEOUS) IMPLANT
DRAPE MICROSCOPE LEICA (MISCELLANEOUS) ×3 IMPLANT
DRAPE POUCH INSTRU U-SHP 10X18 (DRAPES) ×3 IMPLANT
DRAPE SURG 17X11 SM STRL (DRAPES) ×3 IMPLANT
DRAPE UTILITY XL STRL (DRAPES) ×3 IMPLANT
DRSG AQUACEL AG ADV 3.5X 4 (GAUZE/BANDAGES/DRESSINGS) ×9 IMPLANT
DRSG AQUACEL AG ADV 3.5X 6 (GAUZE/BANDAGES/DRESSINGS) ×3 IMPLANT
DRSG PAD ABDOMINAL 8X10 ST (GAUZE/BANDAGES/DRESSINGS) IMPLANT
DURAFORM SPONGE 2X2 SINGLE (Neuro Prosthesis/Implant) ×3 IMPLANT
DURAPREP 26ML APPLICATOR (WOUND CARE) ×3 IMPLANT
DURASEAL SPINE SEALANT 3ML (MISCELLANEOUS) ×3 IMPLANT
ELECT BLADE TIP CTD 4 INCH (ELECTRODE) ×3 IMPLANT
ELECT REM PT RETURN 9FT ADLT (ELECTROSURGICAL) ×3
ELECTRODE REM PT RTRN 9FT ADLT (ELECTROSURGICAL) ×1 IMPLANT
GLOVE BIOGEL PI IND STRL 7.5 (GLOVE) ×1 IMPLANT
GLOVE BIOGEL PI IND STRL 8 (GLOVE) ×1 IMPLANT
GLOVE BIOGEL PI INDICATOR 7.5 (GLOVE) ×2
GLOVE BIOGEL PI INDICATOR 8 (GLOVE) ×2
GLOVE SURG SS PI 7.5 STRL IVOR (GLOVE) ×3 IMPLANT
GLOVE SURG SS PI 8.0 STRL IVOR (GLOVE) ×6 IMPLANT
GOWN STRL REUS W/TWL XL LVL3 (GOWN DISPOSABLE) ×9 IMPLANT
IV CATH 14GX2 1/4 (CATHETERS) ×3 IMPLANT
KIT BASIN OR (CUSTOM PROCEDURE TRAY) ×3 IMPLANT
KIT POSITIONING SURG ANDREWS (MISCELLANEOUS) ×3 IMPLANT
MANIFOLD NEPTUNE II (INSTRUMENTS) ×3 IMPLANT
NEEDLE SPNL 18GX3.5 QUINCKE PK (NEEDLE) ×6 IMPLANT
PACK LAMINECTOMY ORTHO (CUSTOM PROCEDURE TRAY) ×3 IMPLANT
PATTIES SURGICAL .5 X.5 (GAUZE/BANDAGES/DRESSINGS) IMPLANT
PATTIES SURGICAL .75X.75 (GAUZE/BANDAGES/DRESSINGS) IMPLANT
PATTIES SURGICAL 1X1 (DISPOSABLE) IMPLANT
SPONGE SURGIFOAM ABS GEL 100 (HEMOSTASIS) ×3 IMPLANT
STAPLER VISISTAT (STAPLE) ×3 IMPLANT
STRIP CLOSURE SKIN 1/2X4 (GAUZE/BANDAGES/DRESSINGS) IMPLANT
SUT NURALON 4 0 TR CR/8 (SUTURE) ×3 IMPLANT
SUT PROLENE 3 0 PS 2 (SUTURE) IMPLANT
SUT VIC AB 0 CT1 27 (SUTURE)
SUT VIC AB 0 CT1 27XBRD ANTBC (SUTURE) IMPLANT
SUT VIC AB 1 CT1 27 (SUTURE)
SUT VIC AB 1 CT1 27XBRD ANTBC (SUTURE) IMPLANT
SUT VIC AB 1-0 CT2 27 (SUTURE) ×3 IMPLANT
SUT VIC AB 2-0 CT1 27 (SUTURE)
SUT VIC AB 2-0 CT1 TAPERPNT 27 (SUTURE) IMPLANT
SUT VIC AB 2-0 CT2 27 (SUTURE) ×3 IMPLANT
SUT VICRYL 0 27 CT2 27 ABS (SUTURE) ×3 IMPLANT
SUT VICRYL 0 UR6 27IN ABS (SUTURE) IMPLANT
SYR 3ML LL SCALE MARK (SYRINGE) ×3 IMPLANT
SYRINGE 10CC LL (SYRINGE) ×3 IMPLANT
TOWEL OR 17X26 10 PK STRL BLUE (TOWEL DISPOSABLE) ×6 IMPLANT
YANKAUER SUCT BULB TIP NO VENT (SUCTIONS) ×3 IMPLANT

## 2014-07-06 NOTE — Transfer of Care (Signed)
Immediate Anesthesia Transfer of Care Note  Patient: [name]  Procedure(s) Performed: Procedure(s):  MICRO LUMBAR DECOMPRESSION L5-S1 ON LEFT, L4-5 REDO (Left)  Patient Location: PACU  Anesthesia Type:General  Level of Consciousness: sedated  Airway & Oxygen Therapy: Patient Spontanous Breathing and Patient connected to face mask oxygen  Post-op Assessment: Report given to PACU RN and Post -op Vital signs reviewed and stable  Post vital signs: Reviewed and stable  Complications: No apparent anesthesia complications

## 2014-07-06 NOTE — Anesthesia Preprocedure Evaluation (Addendum)
Anesthesia Evaluation  Patient identified by MRN, date of birth, ID band Patient awake    Reviewed: Allergy & Precautions, H&P , NPO status , Patient's Chart, lab work & pertinent test results  Airway Mallampati: II  TM Distance: >3 FB Neck ROM: Full    Dental   Only one remaining tooth right lower.:   Pulmonary former smoker,  breath sounds clear to auscultation  Pulmonary exam normal       Cardiovascular Exercise Tolerance: Good hypertension, Pt. on medications + CAD, + Peripheral Vascular Disease and +CHF + dysrhythmias Rhythm:Regular Rate:Normal     Neuro/Psych PSYCHIATRIC DISORDERS Depression  Neuromuscular disease    GI/Hepatic negative GI ROS, Neg liver ROS, hiatal hernia, PUD, GERD-  Medicated,  Endo/Other  negative endocrine ROS  Renal/GU Renal InsufficiencyRenal disease  negative genitourinary   Musculoskeletal  (+) Arthritis -, Rheumatoid disorders,    Abdominal (+) + obese,   Peds negative pediatric ROS (+)  Hematology negative hematology ROS (+)   Anesthesia Other Findings   Reproductive/Obstetrics negative OB ROS                            Anesthesia Physical Anesthesia Plan  ASA: III  Anesthesia Plan: General   Post-op Pain Management:    Induction: Intravenous  Airway Management Planned: Oral ETT  Additional Equipment:   Intra-op Plan:   Post-operative Plan: Extubation in OR  Informed Consent: I have reviewed the patients History and Physical, chart, labs and discussed the procedure including the risks, benefits and alternatives for the proposed anesthesia with the patient or authorized representative who has indicated his/her understanding and acceptance.   Dental advisory given  Plan Discussed with: CRNA  Anesthesia Plan Comments:         Anesthesia Quick Evaluation

## 2014-07-06 NOTE — Interval H&P Note (Signed)
History and Physical Interval Note:  07/06/2014 7:35 AM  [name]  has presented today for surgery, with the diagnosis of SPINAL STENOSIS L5-S1  The various methods of treatment have been discussed with the patient and family. After consideration of risks, benefits and other options for treatment, the patient has consented to  Procedure(s):  MICRO LUMBAR DECOMPRESSION L5-S1 ON LEFT/POSSIBLE L4-5 (Left) as a surgical intervention .  The patient's history has been reviewed, patient examined, no change in status, stable for surgery.  I have reviewed the patient's chart and labs.  Questions were answered to the patient's satisfaction.     [name]

## 2014-07-06 NOTE — Anesthesia Postprocedure Evaluation (Signed)
  Anesthesia Post-op Note  Patient: [name]  Procedure(s) Performed: Procedure(s) (LRB):  MICRO LUMBAR DECOMPRESSION L5-S1 ON LEFT, L4-5 REDO (Left)  Patient Location: PACU  Anesthesia Type: General  Level of Consciousness: awake and alert   Airway and Oxygen Therapy: Patient Spontanous Breathing  Post-op Pain: mild  Post-op Assessment: Post-op Vital signs reviewed, Patient's Cardiovascular Status Stable, Respiratory Function Stable, Patent Airway and No signs of Nausea or vomiting  Last Vitals:  Filed Vitals:   07/06/14 1204  BP:   Pulse:   Temp: 36.7 C  Resp:     Post-op Vital Signs: stable   Complications: No apparent anesthesia complications

## 2014-07-06 NOTE — Brief Op Note (Signed)
07/06/2014  11:53 AM  PATIENT:  [name]  78 y.o. male  PRE-OPERATIVE DIAGNOSIS:  spinal stenosis L5,S1  POST-OPERATIVE DIAGNOSIS:  spinal stenosis L5,S1  PROCEDURE:  Procedure(s):  MICRO LUMBAR DECOMPRESSION L5-S1 ON LEFT, L4-5 REDO (Left)  SURGEON:  Surgeon(s) and Role:    * Johnn Hai, MD - Primary    * Magnus Sinning, MD - Assisting  PHYSICIAN ASSISTANT:   ASSISTANTS: [place]   ANESTHESIA:   general  EBL:  Total I/O In: 1000 [I.V.:1000] Out: 50 [Blood:50]  BLOOD ADMINISTERED:none  DRAINS: none   LOCAL MEDICATIONS USED:  MARCAINE     SPECIMEN:  No Specimen  DISPOSITION OF SPECIMEN:  N/A  COUNTS:  YES  TOURNIQUET:  * No tourniquets in log *  DICTATION: .Other Dictation: Dictation Number [id number]  PLAN OF CARE: Admit for overnight observation  PATIENT DISPOSITION:  PACU - hemodynamically stable.   Delay start of Pharmacological VTE agent (>24hrs) due to surgical blood loss or risk of bleeding: yes

## 2014-07-06 NOTE — Plan of Care (Signed)
Problem: Consults Goal: Diagnosis - Spinal Surgery Lumbar Laminectomy (Complex)     

## 2014-07-06 NOTE — H&P (View-Only) (Signed)
[name] is an 78 y.o. male.   Chief Complaint: back and left leg pain HPI: The patient is a 78 year old male who presents today for follow up of their back. The patient is being followed for their low back symptoms. They are now 10 1/2 months out from lumbar decompression with lateral mass fusion. Symptoms reported today include: pain, numbness, leg pain (bilateral) and pain with standing, while the patient does not report symptoms of: stiffness. The patient states that they are doing poorly. Current treatment includes: relative rest, activity modification and pain medications. The following medication has been used for pain control: Percocet and gabapentin. The patient presents today following left L5 SNRB x 5 days. The patient reports the injection helped for one day.  He follows up. He had a selective nerve root block with two days of complete relief and now it has returned. This is severe, radiating down into the top of his foot in the L5 nerve root distribution. He continues to take Percocet. He is here with his wife. He otherwise feels healthy.  Past Medical History  Diagnosis Date  . Other chronic nonalcoholic liver disease   . Diverticulosis of colon (without mention of hemorrhage)   . Gastritis   . Esophagitis   . Abdominal tenderness, epigastric   . Bradycardia   . Chest pain   . Dizziness   . Hepatotoxicity     drug induced  . Bladder cancer   . Cough   . Colonic polyp   . Allergic rhinitis   . Renal insufficiency   . CAD (coronary artery disease)   . CHF (congestive heart failure)   . Lumbar disc disease   . PUD (peptic ulcer disease)   . Hiatal hernia     with reflux  . Rheumatoid arthritis(714.0)   . HTN (hypertension)   . HLD (hyperlipidemia)   . GERD (gastroesophageal reflux disease)   . Depression   . S/P angioplasty   . Tremors of nervous system     Past Surgical History  Procedure Laterality Date  . Adenoidectomy    . Coronary angioplasty       percutaneious transluminal  . Tonsillectomy    . Bladder surgery    . Lumbar disc surgery  1/08  . Transurethral resection of bladder tumor    . Lumbar laminectomy/decompression microdiscectomy  07/09/2011    Procedure: LUMBAR LAMINECTOMY/DECOMPRESSION MICRODISCECTOMY;  Surgeon: Johnn Hai;  Location: [facility] ORS;  Service: Orthopedics;  Laterality: Left;  Decompression L5 - S1 on the Left and repair of dura  . Back surgery    . Lumbar laminectomy/decompression microdiscectomy N/A 07/27/2013    Procedure: DECOMPRESSION L4 - L5 WITH EXCISION OF SYNOVIAL CYST AND, LATERAL MASS FUSION 1 LEVEL;  Surgeon: Johnn Hai, MD;  Location: [facility]S;  Service: Orthopedics;  Laterality: N/A;    Family History  Problem Relation Age of Onset  . Heart attack Mother   . Cancer Father     lung  . Cancer Sister     lung  . Diabetes Sister   . Cancer Brother     lung that spread to brain   Social History:  reports that he quit smoking about 28 years ago. His smoking use included Cigarettes. He has a 22.5 pack-year smoking history. He has never used smokeless tobacco. He reports that he does not drink alcohol or use illicit drugs.  Allergies:  Allergies  Allergen Reactions  . Hydrochlorothiazide     REACTION:  hyponatremia with daily use  . Lisinopril     10/20/13 cough  . Morphine And Related     Migraine   . Sulfonamide Derivatives     REACTION: rash     (Not in a hospital admission)  No results found for this or any previous visit (from the past 48 hour(s)). No results found.  Review of Systems  Constitutional: Negative.   HENT: Negative.   Eyes: Negative.   Respiratory: Negative.   Cardiovascular: Negative.   Gastrointestinal: Negative.   Genitourinary: Negative.   Musculoskeletal: Positive for back pain.  Skin: Negative.   Neurological: Positive for sensory change and focal weakness.  Psychiatric/Behavioral: Negative.     There were no vitals taken for this visit. Physical  Exam  Constitutional: He is oriented to person, place, and time. He appears well-developed and well-nourished.  HENT:  Head: Normocephalic and atraumatic.  Eyes: Conjunctivae and EOM are normal. Pupils are equal, round, and reactive to light.  Neck: Normal range of motion. Neck supple.  Cardiovascular: Normal rate and regular rhythm.   Respiratory: Effort normal and breath sounds normal.  GI: Soft. Bowel sounds are normal.  Musculoskeletal:  Lumbar spine exam reveals no evidence of soft tissue swelling, no evidence of soft tissue swelling or deformity or skin ecchymosis. On palpation there is no tenderness of the lumbar spine. No flank pain with percussion. The abdomen is soft and nontender. Nontender over the trochanters. No cellulitis or lymphadenopathy.  Straight leg raise produces marked buttock, thigh and calf pain. This is exacerbated with dorsal augmentation maneuver. EHL is 4+/5. Altered sensation in the L5 dermatome. . The patient is normoreflexic. There is no Babinski or clonus. Sensory exam is intact to light touch. The patient has good distal pulses. No DVT. No pain and normal range of motion without instability of the hips, knees and ankle   Neurological: He is alert and oriented to person, place, and time. He has normal reflexes.  Skin: Skin is warm and dry.  Psychiatric: He has a normal mood and affect.    MRI demonstrates a severe foraminal stenosis on the left at L5 secondary to facet hypertrophy and disk degeneration.  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 Diagnostic temporary relief from an L5 selective nerve root block requiring chronic analgesics with neural tension signs and EHL weakness.  We had a discussion concerning the pathology, relative anatomy and treatment options. We discussed living with his symptoms and pain management. We di sussed a foraminotomy or a decompression at L5-S1 on the left. He may  require a hemilaminectomy for his disk degeneration. There is minimal back pain. I would not recommend a fusion as it would require a fusion into the L4-5 region where he has had surgery in the past. This was for his synovial cyst.  I had an extensive discussion of the risks and benefits of the lumbar decompression with the patient including bleeding, infection, damage to neurovascular structures, epidural fibrosis, CSF leak requiring repair. We also discussed increase in pain, adjacent segment disease, recurrent disc herniation, need for future surgery including repeat decompression and/or fusion. We also discussed risks of postoperative hematoma, paralysis, anesthetic complications including DVT, PE, death, cardiopulmonary dysfunction. In addition, the perioperative and postoperative courses were discussed in detail including the rehabilitative time and return to functional activity and work. I provided the patient with an illustrated handout and utilized the appropriate surgical models.  I did indicate a salvage procedure we would  consider a two level fusion. Again, at this point in time I do not feel that is indicate. He does have underlying calcification of his aortic vessels. This is a concern as well. He has otherwise been healthy with no chest pain, shortness or breath or difficulty with urination. No fevers or chills.  I reviewed the MRI and his X-rays with him in detail.  I refilled his Oxycodone. We will schedule him for after the holidays. I do not feel another selective nerve root block would be particularly beneficial to him. We spent considerable time discussing these issues.  Plan microlumbar decompression L5-S1 left, possible L4-5  Neziah Vogelgesang M. PA-C for Dr. Tonita Cong 06/20/2014, 1:01 PM

## 2014-07-07 DIAGNOSIS — M4806 Spinal stenosis, lumbar region: Secondary | ICD-10-CM | POA: Diagnosis not present

## 2014-07-07 NOTE — Progress Notes (Signed)
OT Cancellation Note  Patient Details Name: [name] MRN: [id number] DOB: 02/07/1936   Cancelled Treatment:    Reason Eval/Treat Not Completed: OT screened, no needs identified, will sign off  [facility], OTR/L  [phone] 07/07/2014 07/07/2014, 10:08 AM

## 2014-07-07 NOTE — Progress Notes (Addendum)
Subjective: 1 Day Post-Op Procedure(s) (LRB):  MICRO LUMBAR DECOMPRESSION L5-S1 ON LEFT, L4-5 REDO (Left) Patient reports pain as moderate.  Controlled with oral pain meds.  No n/v.  OOB to walk the halls earlier today.  Objective: Vital signs in last 24 hours: Temp:  [97.3 F (36.3 C)-98.9 F (37.2 C)] 98.9 F (37.2 C) (12/25 0545) Pulse Rate:  [54-68] 60 (12/25 0956) Resp:  [11-17] 16 (12/25 0545) BP: (106-148)/(48-76) 114/49 mmHg (12/25 0956) SpO2:  [97 %-100 %] 99 % (12/25 0545)  Intake/Output from previous day: 12/24 0701 - 12/25 0700 In: 4475 [P.O.:1100; I.V.:3325; IV Piggyback:50] Out: 2350 [Urine:2300; Blood:50] Intake/Output this shift: Total I/O In: 240 [P.O.:240] Out: 600 [Urine:600]  No results for input(s): HGB in the last 72 hours. No results for input(s): WBC, RBC, HCT, PLT in the last 72 hours. No results for input(s): NA, K, CL, CO2, BUN, CREATININE, GLUCOSE, CALCIUM in the last 72 hours. No results for input(s): LABPT, INR in the last 72 hours.  PE:  wn wd male in nad.  Wound dressed and dry.  Normal neuro exam of bilat LEs.  Assessment/Plan: 1 Day Post-Op Procedure(s) (LRB):  MICRO LUMBAR DECOMPRESSION L5-S1 ON LEFT, L4-5 REDO (Left) D/c home today.  Wylene Simmer 07/07/2014, 9:59 AM

## 2014-07-07 NOTE — Evaluation (Signed)
Physical Therapy Evaluation Patient Details Name: [name] MRN: [id number] DOB: 02-Apr-1936 Today's Date: 07/07/2014   History of Present Illness  L5 - S1 microlumbar decompression.  Hx of 3 other back surgeries  Clinical Impression  Pt s/p micro-lumbar decompression presents with functional mobility limitations 2* post op discomfort and back precautions.  Pt currently mobilizing at supervision level or better and with good knowledge of back precautions demonstrated.    Follow Up Recommendations No PT follow up    Equipment Recommendations  None recommended by PT    Recommendations for Other Services       Precautions / Restrictions Precautions Precautions: Back Precaution Booklet Issued: Yes (comment) Precaution Comments: reviewed back precautions with pt.  Pt very familar with same following several other back procedures Restrictions Weight Bearing Restrictions: No      Mobility  Bed Mobility Overal bed mobility: Modified Independent                Transfers Overall transfer level: Needs assistance Equipment used: None Transfers: Sit to/from Stand Sit to Stand: Supervision         General transfer comment: min cues for use of UEs  Ambulation/Gait Ambulation/Gait assistance: Min guard;Supervision Ambulation Distance (Feet): 400 Feet Assistive device: None Gait Pattern/deviations: Step-through pattern;Decreased step length - right;Decreased step length - left;Trunk flexed;Shuffle;Wide base of support Gait velocity: mod pace   General Gait Details: min cues for posture  Stairs            Wheelchair Mobility    Modified Rankin (Stroke Patients Only)       Balance Overall balance assessment: No apparent balance deficits (not formally assessed)                                           Pertinent Vitals/Pain Pain Assessment: 0-10 Pain Score: 3  Pain Location: back - denies leg pain Pain Descriptors / Indicators:  Sore Pain Intervention(s): Limited activity within patient's tolerance;Monitored during session;Premedicated before session    Home Living Family/patient expects to be discharged to:: Private residence Living Arrangements: Spouse/significant other Available Help at Discharge: Family Type of Home: House Home Access: Stairs to enter Entrance Stairs-Rails: None Entrance Stairs-Number of Steps: 1 Home Layout: One level Home Equipment: Environmental consultant - 2 wheels;Bedside commode;Shower seat      Prior Function Level of Independence: Independent         Comments: buthaving pain with activity prior to surgery     Hand Dominance        Extremity/Trunk Assessment   Upper Extremity Assessment: Overall WFL for tasks assessed           Lower Extremity Assessment: Overall WFL for tasks assessed         Communication   Communication: No difficulties  Cognition Arousal/Alertness: Awake/alert Behavior During Therapy: WFL for tasks assessed/performed Overall Cognitive Status: Within Functional Limits for tasks assessed                      General Comments      Exercises        Assessment/Plan    PT Assessment Patent does not need any further PT services  PT Diagnosis Difficulty walking   PT Problem List    PT Treatment Interventions     PT Goals (Current goals can be found in the Care Plan section) Acute Rehab PT  Goals Patient Stated Goal: HOME for Christmas PT Goal Formulation: All assessment and education complete, DC therapy    Frequency     Barriers to discharge        Co-evaluation               End of Session   Activity Tolerance: Patient tolerated treatment well Patient left: in chair;with call bell/phone within reach Nurse Communication: Mobility status         Time: 5597-4163 PT Time Calculation (min) (ACUTE ONLY): 15 min   Charges:   PT Evaluation $Initial PT Evaluation Tier I: 1 Procedure     PT G Codes:         Jamiere Gulas 07/07/2014, 12:16 PM

## 2014-07-07 NOTE — Discharge Instructions (Signed)

## 2014-07-08 NOTE — Care Management Note (Signed)
    Page 1 of 1   07/08/2014     9:10:14 AM CARE MANAGEMENT NOTE 07/08/2014  Patient:  [name], [name]   Account Number:  [id number]  Date Initiated:  07/08/2014  Documentation initiated by:  [facility]  Subjective/Objective Assessment:   adm: MICRO LUMBAR DECOMPRESSION L5-S1 ON LEFT, L4-5 REDO (Left)     Action/Plan:   home self care   Anticipated DC Date:  07/07/2014   Anticipated DC Plan:  HOME/SELF CARE         Choice offered to / List presented to:             Status of service:  Completed, signed off Medicare Important Message given?   (If response is "NO", the following Medicare IM given date fields will be blank) Date Medicare IM given:   Medicare IM given by:   Date Additional Medicare IM given:   Additional Medicare IM given by:    Discharge Disposition:  HOME/SELF CARE  Per UR Regulation:    If discussed at Long Length of Stay Meetings, dates discussed:    Comments:  07/08/14 09:00 CM reviewed pt stay and notes no PT/OT follow up recc.  No other CM needs were communicated.  Pt discharged self care.  Mariane Masters, BSN, CM [phone].

## 2014-07-09 NOTE — Op Note (Signed)
NAME:  [name], [name]                       ACCOUNT NO.:  MEDICAL RECORD NO.:  [id number]  LOCATION:                                 FACILITY:  PHYSICIAN:  Susa Day, M.D.    DATE OF BIRTH:  10-14-1935  DATE OF PROCEDURE:  07/06/2014 DATE OF DISCHARGE:                              OPERATIVE REPORT   PREOPERATIVE DIAGNOSES:  Spinal stenosis, L4-L5, L5-S1, recurrent spinal stenosis, L4-L5, L5-S1.  POSTOPERATIVE DIAGNOSES:  Spinal stenosis, L4-L5, L5-S1, recurrent spinal stenosis L4-L5, L5-S1.  PROCEDURES PERFORMED: 1. Redo microlumbar decompression, L4-L5, L5-S1. 2. Foraminotomy, L5-S1. 3. Partial facetectomy, L5-S1, left.  ANESTHESIA:  General.  ASSISTANT:  Tarri Glenn, M.D.  HISTORY:  A 78 year old with history of lumbar decompression in the past at 4-5, 3-2.  In the L5-S1, he had refractory recurrent L5 radicular pain and neural foraminal stenosis of L5 despite rest, activity modification.  Confirmatory selective nerve root block at L5 was indicated for decompression, redo decompression at 5, feeling that the facet had overgrown compressing the 5 root.  Risks and benefits discussed including bleeding, infection, damage to neurovascular structures, DVT, PE, anesthetic complications.  No change in symptoms or worsening symptoms, etc.  TECHNIQUE:  With the patient in supine position, after induction of adequate general anesthesia, 2 g Kefzol placed, and 900 mg of clindamycin, placed prone on the [place] frame.  All bony prominences well padded.  Lumbar region was prepped and draped in usual sterile fashion.  Two 18-gauge spinal needle was utilized to localize the 4-5 and 5-1 interspace, confirmed with x-ray.  Incision was made in the previous incision from 4-5 to 5-1, subcutaneous tissue was dissected. Electrocautery was utilized to achieve hemostasis.  Extensive scar tissue was noted as well as epidural fibrosis.  We meticulously skeletonized the previous laminotomy  defects at 5-1 and the residual lamina of 5.  Hypertrophic facet at 5-1.  Performed a foraminotomy of S1, gently developed a plane between the nerve root thecal sac and the residual facet and the superior articulating process of S1.  It was mobile and appeared to be projecting into the 5 foramen compressing the 5 nerve root.  The operating microscope and a confirmatory radiographs, we then meticulously removed the superior articulating process of S1 and partial hemi-facetectomy and the inferior process of 5 on the left decompressing the lateral recesses.  Performed foraminotomy of L5 extending up in the 4-5 removing hypertrophic ligamentum flavum.  There is extensive epidural fibrosis noted as well.  Laminar foraminotomies and mobilization and partial removal of the facet.  The neural probe passed freely up the foramen of 4-5 and S1.  We obtained confirmatory radiograph with the neuro probe in the foramen of 5 indicating decompression above and below it with removal of the hemi lamina and the partial removal of the facet.  Bipolar electrocautery was utilized to achieve hemostasis as with thrombin-soaked Gelfoam.  Copiously irrigated the wound.  Valsalva indicated no evidence of CSF leakage or active bleeding.  Thrombin-soaked Gelfoam was placed in the laminotomy defect. A __________ retractor was removed.  Paraspinous muscles inspected, no active bleeding.  Closed the fascia with 1 Vicryl subcu  with 2-0 and skin with staples.  Wound was dressed sterilely.  Placed supine on hospital bed, extubated without difficulty, and transported to the recovery room in satisfactory condition.  The patient tolerated the procedure well.  No complications.  Assistant, Tarri Glenn, M.D.  Minimal blood loss.     Susa Day, M.D.     Geralynn Rile  D:  07/06/2014  T:  07/06/2014  Job:  [id number]

## 2014-07-10 ENCOUNTER — Encounter (HOSPITAL_COMMUNITY): Payer: Self-pay | Admitting: Specialist

## 2014-07-10 NOTE — Discharge Summary (Signed)
Physician Discharge Summary   Patient ID: [name] MRN: [id number] DOB/AGE: October 27, 1935 78 y.o.  Admit date: 07/06/2014 Discharge date: 07/10/2014  Primary Diagnosis:   spinal stenosis L5,S1  Admission Diagnoses:  Past Medical History  Diagnosis Date  . Other chronic nonalcoholic liver disease   . Diverticulosis of colon (without mention of hemorrhage)   . Gastritis   . Esophagitis   . Abdominal tenderness, epigastric   . Bradycardia   . Chest pain   . Dizziness   . Hepatotoxicity     drug induced  . Bladder cancer   . Cough   . Colonic polyp   . Allergic rhinitis   . Renal insufficiency   . CAD (coronary artery disease)   . CHF (congestive heart failure)   . Lumbar disc disease   . PUD (peptic ulcer disease)   . Hiatal hernia     with reflux  . Rheumatoid arthritis(714.0)   . HTN (hypertension)   . HLD (hyperlipidemia)   . GERD (gastroesophageal reflux disease)   . Depression   . S/P angioplasty   . Tremors of nervous system    Discharge Diagnoses:   Active Problems:   Spinal stenosis of lumbar region at multiple levels  Procedure:  Procedure(s) (LRB):  MICRO LUMBAR DECOMPRESSION L5-S1 ON LEFT, L4-5 REDO (Left)   Consults: None  HPI:  see H&P    Laboratory Data: Hospital Outpatient Visit on 06/30/2014  Component Date Value Ref Range Status  . Sodium 06/30/2014 130* 137 - 147 mEq/L Final  . Potassium 06/30/2014 3.2* 3.7 - 5.3 mEq/L Final  . Chloride 06/30/2014 90* 96 - 112 mEq/L Final  . CO2 06/30/2014 27  19 - 32 mEq/L Final  . Glucose, Bld 06/30/2014 116* 70 - 99 mg/dL Final  . BUN 06/30/2014 9  6 - 23 mg/dL Final  . Creatinine, Ser 06/30/2014 0.80  0.50 - 1.35 mg/dL Final  . Calcium 06/30/2014 9.7  8.4 - 10.5 mg/dL Final  . GFR calc non Af Amer 06/30/2014 85* >90 mL/min Final  . GFR calc Af Amer 06/30/2014 >90  >90 mL/min Final   Comment: (NOTE) The eGFR has been calculated using the CKD EPI equation. This calculation has not been validated  in all clinical situations. eGFR's persistently <90 mL/min signify possible Chronic Kidney Disease.   . Anion gap 06/30/2014 13  5 - 15 Final  . WBC 06/30/2014 6.3  4.0 - 10.5 K/uL Final  . RBC 06/30/2014 5.09  4.22 - 5.81 MIL/uL Final  . Hemoglobin 06/30/2014 15.9  13.0 - 17.0 g/dL Final  . HCT 06/30/2014 45.9  39.0 - 52.0 % Final  . MCV 06/30/2014 90.2  78.0 - 100.0 fL Final  . MCH 06/30/2014 31.2  26.0 - 34.0 pg Final  . MCHC 06/30/2014 34.6  30.0 - 36.0 g/dL Final  . RDW 06/30/2014 12.1  11.5 - 15.5 % Final  . Platelets 06/30/2014 255  150 - 400 K/uL Final  . MRSA, PCR 06/30/2014 NEGATIVE  NEGATIVE Final  . Staphylococcus aureus 06/30/2014 POSITIVE* NEGATIVE Final   Comment:        The Xpert SA Assay (FDA approved for NASAL specimens in patients over 79 years of age), is one component of a comprehensive surveillance program.  Test performance has been validated by [employer] for patients greater than or equal to 48 year old. It is not intended to diagnose infection nor to guide or monitor treatment.    No results for input(s): HGB in  the last 72 hours. No results for input(s): WBC, RBC, HCT, PLT in the last 72 hours. No results for input(s): NA, K, CL, CO2, BUN, CREATININE, GLUCOSE, CALCIUM in the last 72 hours. No results for input(s): LABPT, INR in the last 72 hours.  X-Rays:Dg Chest 2 View  06/30/2014   CLINICAL DATA:  Preop chest radiograph.  Bradycardia.  EXAM: CHEST  2 VIEW  COMPARISON:  10/20/2013  FINDINGS: The heart size and mediastinal contours are within normal limits. Both lungs are clear. The visualized skeletal structures are unremarkable.  IMPRESSION: No active cardiopulmonary disease.   Electronically Signed   By: Kerby Moors M.D.   On: 06/30/2014 16:15   Dg Lumbar Spine 2-3 Views  06/30/2014   CLINICAL DATA:  Pre-op for lumbar surgery due to spinal stenosis.  EXAM: LUMBAR SPINE - 2-3 VIEW  COMPARISON:  Intraoperative radiographs 07/27/2013.  Diagnostic series 112 2015.  FINDINGS: There are 5 lumbar type vertebral bodies. The alignment is near anatomic. There is convex left scoliosis with a minimal anterolisthesis at L4-5. Chronic disc space loss at L5-S1 is noted. There is no evidence of acute fracture. Aortoiliac atherosclerosis is present.  IMPRESSION: Stable alignment and chronic disc space loss at L5-S1. No acute osseous findings.   Electronically Signed   By: Camie Patience M.D.   On: 06/30/2014 16:24   Dg Spine Portable 1 View  07/06/2014   CLINICAL DATA:  Interoperative exam.  Lumbar spine surgery at L5-S1  EXAM: PORTABLE SPINE - 1 VIEW  COMPARISON:  Radiograph 07/06/2014  FINDINGS: Single lateral intraoperative view of lumbar spine demonstrates curved tip probe at L5 vertebral body level. Skin spreaders at L5-S1.  IMPRESSION: Intraoperative view as described above.   Electronically Signed   By: Suzy Bouchard M.D.   On: 07/06/2014 11:55   Dg Spine Portable 1 View  07/06/2014   CLINICAL DATA:  Surgical level L5-S1. Please number spaces between vertebra.  EXAM: PORTABLE SPINE - 1 VIEW  COMPARISON:  Earlier the same day.  FINDINGS: Lumbar vertebral bodies and disc spaces numbered as on the prior exam. There are 2 surgical probes, 1 of the tips at the L5 pedicle, 1 localizing to the L4-L5 disc space.  IMPRESSION: Lumbar vertebral body and disc spaces numbered. The 2 surgical probes project at the L5 pedicle and L4-L5 disc space.   Electronically Signed   By: Jeb Levering M.D.   On: 07/06/2014 11:45   Dg Spine Portable 1 View  07/06/2014   CLINICAL DATA:  Intraoperative lumbar decompression  EXAM: PORTABLE SPINE - 1 VIEW  COMPARISON:  Study obtained earlier in the day  FINDINGS: This lateral lumbar images labeled image 2. Metallic probe tip overlies the L5 spinous process with the tip posterior to the midportion of the L5 vertebral body. There is no fracture or spondylolisthesis. There is moderate disc space narrowing at L4-5 and  L5-S1. There is extensive atherosclerotic calcification in the aorta.  IMPRESSION: Metallic probe tip overlies the L5 spinous process with the tip posterior to the mid L5 vertebral body level. Lower lumbar osteoarthritic change. No fracture or spondylolisthesis. Extensive atherosclerotic calcification in the aorta.   Electronically Signed   By: Lowella Grip M.D.   On: 07/06/2014 11:14   Dg Spine Portable 1 View  07/06/2014   CLINICAL DATA:  Intraoperative radiograph  EXAM: PORTABLE SPINE - 1 VIEW  COMPARISON:  Radiograph 06/30/2014  FINDINGS: Using the numbering convention of the comparative radiograph, there are sharp tip probes posterior  to the L5 and S1 vertebral bodies. Prior decompression at L4.  IMPRESSION: Intraoperative radiograph as above   Electronically Signed   By: Suzy Bouchard M.D.   On: 07/06/2014 11:00    EKG: Orders placed or performed during the hospital encounter of 06/30/14  . EKG 12-Lead  . EKG 12-Lead     Hospital Course: Patient was admitted to [facility] and taken to the OR and underwent the above state procedure without complications.  Patient tolerated the procedure well and was later transferred to the recovery room and then to the orthopaedic floor for postoperative care.  They were given PO and IV analgesics for pain control following their surgery.  They were given 24 hours of postoperative antibiotics.   PT was consulted postop to assist with mobility and transfers.  The patient was allowed to be WBAT with therapy and was taught back precautions. Discharge planning was consulted to help with postop disposition and equipment needs.  Patient had a good night on the evening of surgery and started to get up OOB with therapy on day one. Patient was seen in rounds and was ready to go home on day one.  They were given discharge instructions and dressing directions.  They were instructed on when to follow up in the office with Dr. Tonita Cong.   Diet: Regular  diet Activity:WBAT; Lspine precautions Follow-up:in 10-14 days Disposition - Home Discharged Condition: good   Discharge Instructions    Call MD / Call 911    Complete by:  As directed   If you experience chest pain or shortness of breath, CALL 911 and be transported to the hospital emergency room.  If you develope a fever above 101 F, pus (white drainage) or increased drainage or redness at the wound, or calf pain, call your surgeon's office.     Constipation Prevention    Complete by:  As directed   Drink plenty of fluids.  Prune juice may be helpful.  You may use a stool softener, such as Colace (over the counter) 100 mg twice a day.  Use MiraLax (over the counter) for constipation as needed.     Diet - low sodium heart healthy    Complete by:  As directed      Increase activity slowly as tolerated    Complete by:  As directed             Medication List    STOP taking these medications        HYDROcodone-acetaminophen 5-325 MG per tablet  Commonly known as:  NORCO      TAKE these medications        amLODipine 5 MG tablet  Commonly known as:  NORVASC  Take 1 tablet (5 mg total) by mouth every morning.     aspirin EC 81 MG tablet  Take 81 mg by mouth daily.     cetirizine 10 MG tablet  Commonly known as:  ZYRTEC  Take 10 mg by mouth daily as needed for allergies. As needed     cholecalciferol 1000 UNITS tablet  Commonly known as:  VITAMIN D  Take 1,000 Units by mouth daily.     citalopram 10 MG tablet  Commonly known as:  CELEXA  Take 10 mg by mouth every morning.     clotrimazole 1 % cream  Commonly known as:  LOTRIMIN  Apply 1 application topically 2 (two) times daily as needed. Prescribed for rash in groin from [place] on 06/20/2014  clotrimazole-betamethasone cream  Commonly known as:  LOTRISONE  Use as directed twice per day as needed     docusate sodium 100 MG capsule  Commonly known as:  COLACE  Take 1 capsule (100 mg total) by mouth 2 (two) times  daily.     fluticasone 50 MCG/ACT nasal spray  Commonly known as:  FLONASE  Place 2 sprays into both nostrils daily as needed for rhinitis. Allergies     gabapentin 300 MG capsule  Commonly known as:  NEURONTIN  TAKE 1 TO 2 CAPSULES FOUR TIMES DAILY - AFTER MEALS AND AT BEDTIME.     hydrochlorothiazide 25 MG tablet  Commonly known as:  HYDRODIURIL  Take 25 mg by mouth every morning.     losartan 50 MG tablet  Commonly known as:  COZAAR  Take 1 tablet (50 mg total) by mouth daily.     methylcellulose 1 % ophthalmic solution  Commonly known as:  ARTIFICIAL TEARS  Place 1 drop into both eyes 2 (two) times daily as needed (dry eyes).     nitroGLYCERIN 0.4 MG SL tablet  Commonly known as:  NITROSTAT  Place 1 tablet (0.4 mg total) under the tongue every 5 (five) minutes as needed. Chest pain     omeprazole 20 MG capsule  Commonly known as:  PRILOSEC  Take 20 mg by mouth 2 (two) times daily before a meal.     oxyCODONE-acetaminophen 5-325 MG per tablet  Commonly known as:  PERCOCET  Take 1 tablet by mouth every 4 (four) hours as needed.     primidone 250 MG tablet  Commonly known as:  MYSOLINE  Take 0.5 tablets (125 mg total) by mouth 3 (three) times daily.     simvastatin 40 MG tablet  Commonly known as:  ZOCOR  Take 1.5 tablets (60 mg total) by mouth at bedtime.     tamsulosin 0.4 MG Caps capsule  Commonly known as:  FLOMAX  Take 1 capsule (0.4 mg total) by mouth daily.     Travoprost (BAK Free) 0.004 % Soln ophthalmic solution  Commonly known as:  TRAVATAN Z  Place 1 drop into both eyes at bedtime.     vitamin C 500 MG tablet  Commonly known as:  ASCORBIC ACID  Take 500 mg by mouth 2 (two) times daily.           Follow-up Information    Follow up with Johnn Hai, MD.   Specialty:  Orthopedic Surgery   Contact information:   [address] [place] [address] [place] [address] [phone]       Signed: Lacie Draft, PA-C Orthopaedic  Surgery 07/10/2014, 8:41 AM

## 2014-07-14 HISTORY — PX: CATARACT EXTRACTION W/ INTRAOCULAR LENS  IMPLANT, BILATERAL: SHX1307

## 2014-07-18 DIAGNOSIS — Z4789 Encounter for other orthopedic aftercare: Secondary | ICD-10-CM | POA: Diagnosis not present

## 2014-07-25 DIAGNOSIS — L02213 Cutaneous abscess of chest wall: Secondary | ICD-10-CM | POA: Diagnosis not present

## 2014-08-07 DIAGNOSIS — H401233 Low-tension glaucoma, bilateral, severe stage: Secondary | ICD-10-CM | POA: Diagnosis not present

## 2014-08-07 DIAGNOSIS — Z961 Presence of intraocular lens: Secondary | ICD-10-CM | POA: Diagnosis not present

## 2014-08-07 DIAGNOSIS — H02054 Trichiasis without entropian left upper eyelid: Secondary | ICD-10-CM | POA: Diagnosis not present

## 2014-08-07 DIAGNOSIS — H2513 Age-related nuclear cataract, bilateral: Secondary | ICD-10-CM | POA: Diagnosis not present

## 2014-08-08 DIAGNOSIS — L239 Allergic contact dermatitis, unspecified cause: Secondary | ICD-10-CM | POA: Diagnosis not present

## 2014-08-15 DIAGNOSIS — Z4789 Encounter for other orthopedic aftercare: Secondary | ICD-10-CM | POA: Diagnosis not present

## 2014-08-19 ENCOUNTER — Other Ambulatory Visit: Payer: Self-pay | Admitting: Internal Medicine

## 2014-09-14 ENCOUNTER — Ambulatory Visit (INDEPENDENT_AMBULATORY_CARE_PROVIDER_SITE_OTHER): Payer: Medicare Other | Admitting: Podiatrist

## 2014-09-14 ENCOUNTER — Encounter: Payer: Self-pay | Admitting: Podiatrist

## 2014-09-14 DIAGNOSIS — B351 Tinea unguium: Secondary | ICD-10-CM | POA: Diagnosis not present

## 2014-09-14 DIAGNOSIS — M79676 Pain in unspecified toe(s): Secondary | ICD-10-CM

## 2014-09-14 NOTE — Progress Notes (Signed)
HPI:  Patient presents today for follow up of foot and nail care. Denies any new complaints today. He had back surgery and is recovering pretty good.   Objective:  Patients chart is reviewed.  Vascular status reveals pedal pulses noted at 2 out of 4 dp and pt bilateral .  Neurological sensation is intact to [employer] monofilament bilateral.  Patients nails are thickened, discolored, distrophic, friable and brittle with yellow-brown discoloration. Patient subjectively relates they are painful with shoes and with ambulation of bilateral feet.  Assessment:  Symptomatic onychomycosis  Plan:  Discussed treatment options and alternatives.  The symptomatic toenails were debrided through manual an mechanical means without complication.  Return appointment recommended at routine intervals of 3 months

## 2014-11-08 DIAGNOSIS — M4806 Spinal stenosis, lumbar region: Secondary | ICD-10-CM | POA: Diagnosis not present

## 2014-12-01 ENCOUNTER — Other Ambulatory Visit: Payer: Self-pay | Admitting: Internal Medicine

## 2014-12-04 DIAGNOSIS — H5340 Unspecified visual field defects: Secondary | ICD-10-CM | POA: Diagnosis not present

## 2014-12-04 DIAGNOSIS — H25812 Combined forms of age-related cataract, left eye: Secondary | ICD-10-CM | POA: Diagnosis not present

## 2014-12-04 DIAGNOSIS — Z961 Presence of intraocular lens: Secondary | ICD-10-CM | POA: Diagnosis not present

## 2014-12-04 DIAGNOSIS — H401233 Low-tension glaucoma, bilateral, severe stage: Secondary | ICD-10-CM | POA: Diagnosis not present

## 2014-12-14 ENCOUNTER — Other Ambulatory Visit: Payer: Self-pay | Admitting: Internal Medicine

## 2014-12-28 ENCOUNTER — Ambulatory Visit (INDEPENDENT_AMBULATORY_CARE_PROVIDER_SITE_OTHER): Payer: Medicare Other | Admitting: Podiatry

## 2014-12-28 ENCOUNTER — Encounter: Payer: Self-pay | Admitting: Podiatry

## 2014-12-28 VITALS — BP 115/61 | HR 66 | Resp 18

## 2014-12-28 DIAGNOSIS — M79676 Pain in unspecified toe(s): Secondary | ICD-10-CM

## 2014-12-28 DIAGNOSIS — B351 Tinea unguium: Secondary | ICD-10-CM

## 2014-12-28 NOTE — Progress Notes (Signed)
Patient ID: [name], male   DOB: 24-Jan-1936, 79 y.o.   MRN: [id number] Complaint:  Visit Type: Patient returns to my office for continued preventative foot care services. Complaint: Patient states" my nails have grown long and thick and become painful to walk and wear shoes" . He presents for preventative foot care services. No changes to ROS  Podiatric Exam: Vascular: dorsalis pedis and posterior tibial pulses are palpable bilateral. Capillary return is immediate. Temperature gradient is WNL. Skin turgor WNL  Sensorium: Normal Sem[employer] Weinstein monofilament test. Normal tactile sensation bilaterally. Nail Exam: Pt has thick disfigured discolored nails with subungual debris noted bilateral entire nail hallux through fifth toenails Ulcer Exam: There is no evidence of ulcer or pre-ulcerative changes or infection. Orthopedic Exam: Muscle tone and strength are WNL. No limitations in general ROM. No crepitus or effusions noted. Foot type and digits show no abnormalities. Bony prominences are unremarkable. Skin: No Porokeratosis. No infection or ulcers  Diagnosis:  Tinea unguium, Pain in right toe, pain in left toes  Treatment & Plan Procedures and Treatment: Consent by patient was obtained for treatment procedures. The patient understood the discussion of treatment and procedures well. All questions were answered thoroughly reviewed. Debridement of mycotic and hypertrophic toenails, 1 through 5 bilateral and clearing of subungual debris. No ulceration, no infection noted.  Return Visit-Office Procedure: Patient instructed to return to the office for a follow up visit 3 months for continued evaluation and treatment.

## 2015-02-15 ENCOUNTER — Other Ambulatory Visit: Payer: Self-pay | Admitting: Internal Medicine

## 2015-03-01 ENCOUNTER — Ambulatory Visit (INDEPENDENT_AMBULATORY_CARE_PROVIDER_SITE_OTHER): Payer: Medicare Other | Admitting: Podiatry

## 2015-03-01 ENCOUNTER — Encounter: Payer: Self-pay | Admitting: Podiatry

## 2015-03-01 ENCOUNTER — Ambulatory Visit (INDEPENDENT_AMBULATORY_CARE_PROVIDER_SITE_OTHER): Payer: Medicare Other

## 2015-03-01 VITALS — BP 126/67 | HR 62 | Resp 16

## 2015-03-01 DIAGNOSIS — M722 Plantar fascial fibromatosis: Secondary | ICD-10-CM

## 2015-03-01 DIAGNOSIS — M79671 Pain in right foot: Secondary | ICD-10-CM

## 2015-03-01 MED ORDER — TRIAMCINOLONE ACETONIDE 10 MG/ML IJ SUSP
10.0000 mg | Freq: Once | INTRAMUSCULAR | Status: AC
  Administered 2015-03-01: 10 mg

## 2015-03-02 NOTE — Progress Notes (Signed)
Subjective:     Patient ID: [name], male   DOB: 1935-12-05, 79 y.o.   MRN: [id number]  HPI patient presents with exquisite discomfort in the plantar aspect of the right heel at the insertional point of the tendon into the calcaneus with inflammation and fluid buildup   Review of Systems     Objective:   Physical Exam Neurovascular status intact with intense discomfort plantar heel at the insertional point of the tendon into the calcaneus    Assessment:     Plantar fasciitis right acute in nature    Plan:     Injected the right plantar fascia 3 mg Kenalog 5 mg Xylocaine and gave instructions on physical therapy and supportive shoes and dispensed fascial brace to lift the arch. Reappoint to recheck

## 2015-03-06 ENCOUNTER — Other Ambulatory Visit: Payer: Self-pay | Admitting: Internal Medicine

## 2015-03-15 DIAGNOSIS — Z981 Arthrodesis status: Secondary | ICD-10-CM | POA: Diagnosis not present

## 2015-03-15 DIAGNOSIS — Z4789 Encounter for other orthopedic aftercare: Secondary | ICD-10-CM | POA: Diagnosis not present

## 2015-03-28 ENCOUNTER — Ambulatory Visit (INDEPENDENT_AMBULATORY_CARE_PROVIDER_SITE_OTHER): Payer: Medicare Other | Admitting: Podiatry

## 2015-03-28 ENCOUNTER — Encounter: Payer: Self-pay | Admitting: Podiatry

## 2015-03-28 VITALS — BP 144/75 | HR 58 | Resp 16

## 2015-03-28 DIAGNOSIS — B351 Tinea unguium: Secondary | ICD-10-CM

## 2015-03-28 DIAGNOSIS — M722 Plantar fascial fibromatosis: Secondary | ICD-10-CM | POA: Diagnosis not present

## 2015-03-28 DIAGNOSIS — M79671 Pain in right foot: Secondary | ICD-10-CM

## 2015-03-28 DIAGNOSIS — M79676 Pain in unspecified toe(s): Secondary | ICD-10-CM

## 2015-03-28 NOTE — Progress Notes (Signed)
Subjective:     Patient ID: [name], male   DOB: [birth year], 79 y.o.   MRN: [id number]  HPI patient states my heel is feeling quite a bit better but I've had some discomfort around my big toe and I did release some fluid last night. The big toe joint might be bother me a little bit but does not seem to be bad   Review of Systems     Objective:   Physical Exam Neurovascular status unchanged with patient having no indications of a paronychia infection currently and noted to have moderate discomfort in the plantar fascia and arch right    Assessment:     Paronychia resolved right with plantar fascial symptoms right still noted    Plan:     Reviewed condition and recommended anti-inflammatory's and supportive shoe gear along with physical therapy. Patient will be seen back to recheck

## 2015-03-30 DIAGNOSIS — M961 Postlaminectomy syndrome, not elsewhere classified: Secondary | ICD-10-CM | POA: Diagnosis not present

## 2015-03-30 DIAGNOSIS — M4316 Spondylolisthesis, lumbar region: Secondary | ICD-10-CM | POA: Diagnosis not present

## 2015-04-05 ENCOUNTER — Ambulatory Visit: Payer: Medicare Other | Admitting: Podiatry

## 2015-04-11 DIAGNOSIS — H25812 Combined forms of age-related cataract, left eye: Secondary | ICD-10-CM | POA: Diagnosis not present

## 2015-04-11 DIAGNOSIS — H401233 Low-tension glaucoma, bilateral, severe stage: Secondary | ICD-10-CM | POA: Diagnosis not present

## 2015-04-11 DIAGNOSIS — Z961 Presence of intraocular lens: Secondary | ICD-10-CM | POA: Diagnosis not present

## 2015-04-26 DIAGNOSIS — Z4789 Encounter for other orthopedic aftercare: Secondary | ICD-10-CM | POA: Diagnosis not present

## 2015-04-26 DIAGNOSIS — Z01812 Encounter for preprocedural laboratory examination: Secondary | ICD-10-CM | POA: Diagnosis not present

## 2015-05-01 ENCOUNTER — Other Ambulatory Visit: Payer: Self-pay | Admitting: Internal Medicine

## 2015-05-03 ENCOUNTER — Encounter: Payer: Self-pay | Admitting: *Deleted

## 2015-05-03 DIAGNOSIS — M545 Low back pain: Secondary | ICD-10-CM | POA: Diagnosis not present

## 2015-05-07 DIAGNOSIS — M961 Postlaminectomy syndrome, not elsewhere classified: Secondary | ICD-10-CM | POA: Diagnosis not present

## 2015-05-07 DIAGNOSIS — M47817 Spondylosis without myelopathy or radiculopathy, lumbosacral region: Secondary | ICD-10-CM | POA: Diagnosis not present

## 2015-05-10 DIAGNOSIS — Z4789 Encounter for other orthopedic aftercare: Secondary | ICD-10-CM | POA: Diagnosis not present

## 2015-05-10 DIAGNOSIS — Z9889 Other specified postprocedural states: Secondary | ICD-10-CM | POA: Diagnosis not present

## 2015-05-11 DIAGNOSIS — M4806 Spinal stenosis, lumbar region: Secondary | ICD-10-CM | POA: Diagnosis not present

## 2015-05-11 DIAGNOSIS — M47816 Spondylosis without myelopathy or radiculopathy, lumbar region: Secondary | ICD-10-CM | POA: Diagnosis not present

## 2015-05-11 DIAGNOSIS — M961 Postlaminectomy syndrome, not elsewhere classified: Secondary | ICD-10-CM | POA: Diagnosis not present

## 2015-05-14 DIAGNOSIS — H52203 Unspecified astigmatism, bilateral: Secondary | ICD-10-CM | POA: Diagnosis not present

## 2015-05-14 DIAGNOSIS — Z23 Encounter for immunization: Secondary | ICD-10-CM | POA: Diagnosis not present

## 2015-05-14 DIAGNOSIS — H02054 Trichiasis without entropian left upper eyelid: Secondary | ICD-10-CM | POA: Diagnosis not present

## 2015-05-14 DIAGNOSIS — H25812 Combined forms of age-related cataract, left eye: Secondary | ICD-10-CM | POA: Diagnosis not present

## 2015-05-14 DIAGNOSIS — Z961 Presence of intraocular lens: Secondary | ICD-10-CM | POA: Diagnosis not present

## 2015-05-15 ENCOUNTER — Ambulatory Visit: Payer: BLUE CROSS/BLUE SHIELD | Admitting: Internal Medicine

## 2015-05-17 ENCOUNTER — Ambulatory Visit: Payer: BLUE CROSS/BLUE SHIELD | Admitting: Internal Medicine

## 2015-05-17 ENCOUNTER — Encounter: Payer: Self-pay | Admitting: Internal Medicine

## 2015-05-17 ENCOUNTER — Ambulatory Visit (INDEPENDENT_AMBULATORY_CARE_PROVIDER_SITE_OTHER): Payer: Medicare Other | Admitting: Internal Medicine

## 2015-05-17 VITALS — BP 130/80 | HR 57 | Temp 98.5°F | Ht 66.0 in | Wt 194.0 lb

## 2015-05-17 DIAGNOSIS — R7302 Impaired glucose tolerance (oral): Secondary | ICD-10-CM

## 2015-05-17 DIAGNOSIS — I10 Essential (primary) hypertension: Secondary | ICD-10-CM

## 2015-05-17 DIAGNOSIS — E785 Hyperlipidemia, unspecified: Secondary | ICD-10-CM | POA: Diagnosis not present

## 2015-05-17 MED ORDER — TAMSULOSIN HCL 0.4 MG PO CAPS: 0.4000 mg | ORAL_CAPSULE | Freq: Every day | ORAL | Status: DC

## 2015-05-17 MED ORDER — SIMVASTATIN 40 MG PO TABS: ORAL_TABLET | ORAL | Status: DC

## 2015-05-17 MED ORDER — HYDROCHLOROTHIAZIDE 25 MG PO TABS: 25.0000 mg | ORAL_TABLET | Freq: Every day | ORAL | Status: DC

## 2015-05-17 MED ORDER — FLUTICASONE PROPIONATE 50 MCG/ACT NA SUSP: NASAL | Status: DC

## 2015-05-17 MED ORDER — AMLODIPINE BESYLATE 5 MG PO TABS
5.0000 mg | ORAL_TABLET | Freq: Every morning | ORAL | Status: DC
Start: 2015-05-17 — End: 2015-07-02

## 2015-05-17 MED ORDER — GABAPENTIN 300 MG PO CAPS: ORAL_CAPSULE | ORAL | Status: DC

## 2015-05-17 MED ORDER — CITALOPRAM HYDROBROMIDE 10 MG PO TABS: 10.0000 mg | ORAL_TABLET | Freq: Every morning | ORAL | Status: DC

## 2015-05-17 MED ORDER — PRIMIDONE 250 MG PO TABS: ORAL_TABLET | ORAL | Status: DC

## 2015-05-17 NOTE — Progress Notes (Signed)
Pre visit review using our clinic review tool, if applicable. No additional management support is needed unless otherwise documented below in the visit note. 

## 2015-05-17 NOTE — Assessment & Plan Note (Signed)
stable overall by history and exam, recent data reviewed with pt, and pt to continue medical treatment as before,  to f/u any worsening symptoms or concerns BP Readings from Last 3 Encounters:  05/17/15 130/80  03/28/15 144/75  03/01/15 126/67

## 2015-05-17 NOTE — Assessment & Plan Note (Signed)
stable overall by history and exam, recent data reviewed with pt, and pt to continue medical treatment as before,  to f/u any worsening symptoms or concerns Lab Results  Component Value Date   HGBA1C 5.7 10/28/2012    

## 2015-05-17 NOTE — Assessment & Plan Note (Signed)
stable overall by history and exam, recent data reviewed with pt, and pt to continue medical treatment as before,  to f/u any worsening symptoms or concerns Lab Results  Component Value Date   LDLCALC 72 09/12/2010

## 2015-05-17 NOTE — Progress Notes (Signed)
Subjective:    Patient ID: [name], male    DOB: 03-Mar-1936, 79 y.o.   MRN: [id number]  HPI  Here for yearly f/u;  Overall doing ok;  Pt denies Chest pain, worsening SOB, DOE, wheezing, orthopnea, PND, worsening LE edema, palpitations, dizziness or syncope.  Pt denies neurological change such as new headache, facial or extremity weakness.  Pt denies polydipsia, polyuria, or low sugar symptoms. Pt states overall good compliance with treatment and medications, good tolerability, and has been trying to follow appropriate diet.  Pt denies worsening depressive symptoms, suicidal ideation or panic. No fever, night sweats, wt loss, loss of appetite, or other constitutional symptoms.  Pt states good ability with ADL's, has low fall risk, home safety reviewed and adequate, no other significant changes in hearing or vision, and only occasionally active with exercise. Wt Readings from Last 3 Encounters:  05/17/15 194 lb (87.998 kg)  07/06/14 195 lb (88.451 kg)  05/04/14 195 lb (88.451 kg)  Pt continues to have recurring LBP without change in severity, bowel or bladder change, fever, wt loss,  worsening LE pain/numbness/weakness, gait change or falls, sp ESI recently, did have a slip and near fall with a pop in the back, trying to avoid surgury. Had lab work at [place] last month, declines today Past Medical History  Diagnosis Date  . Other chronic nonalcoholic liver disease   . Diverticulosis of colon (without mention of hemorrhage)   . Gastritis   . Esophagitis   . Abdominal tenderness, epigastric   . Bradycardia   . Chest pain   . Dizziness   . Hepatotoxicity     drug induced  . Bladder cancer ([place])   . Cough   . Colonic polyp   . Allergic rhinitis   . Renal insufficiency   . CAD (coronary artery disease)   . CHF (congestive heart failure) ([place])   . Lumbar disc disease   . PUD (peptic ulcer disease)   . Hiatal hernia     with reflux  . Rheumatoid arthritis(714.0)   . HTN (hypertension)   .  HLD (hyperlipidemia)   . GERD (gastroesophageal reflux disease)   . Depression   . S/P angioplasty   . Tremors of nervous system    Past Surgical History  Procedure Laterality Date  . Adenoidectomy    . Coronary angioplasty      percutaneious transluminal  . Tonsillectomy    . Bladder surgery    . Lumbar disc surgery  1/08  . Transurethral resection of bladder tumor    . Lumbar laminectomy/decompression microdiscectomy  07/09/2011    Procedure: LUMBAR LAMINECTOMY/DECOMPRESSION MICRODISCECTOMY;  Surgeon: Johnn Hai;  Location: [facility];  Service: Orthopedics;  Laterality: Left;  Decompression L5 - S1 on the Left and repair of dura  . Back surgery    . Lumbar laminectomy/decompression microdiscectomy N/A 07/27/2013    Procedure: DECOMPRESSION L4 - L5 WITH EXCISION OF SYNOVIAL CYST AND, LATERAL MASS FUSION 1 LEVEL;  Surgeon: Johnn Hai, MD;  Location: [facility];  Service: Orthopedics;  Laterality: N/A;  . Lumbar laminectomy/decompression microdiscectomy Left 07/06/2014    Procedure:  MICRO LUMBAR DECOMPRESSION L5-S1 ON LEFT, L4-5 REDO;  Surgeon: Johnn Hai, MD;  Location: [facility];  Service: Orthopedics;  Laterality: Left;    reports that he quit smoking about 29 years ago. His smoking use included Cigarettes. He has a 22.5 pack-year smoking history. He has never used smokeless tobacco. He reports that he does not drink  alcohol or use illicit drugs. family history includes Cancer in his brother, father, and sister; Diabetes in his sister; Heart attack in his mother. Allergies  Allergen Reactions  . Hydrochlorothiazide     REACTION: hyponatremia with daily use  . Lisinopril     10/20/13 cough  . Morphine And Related     Migraine   . Sulfonamide Derivatives     REACTION: rash   Current Outpatient Prescriptions on File Prior to Visit  Medication Sig Dispense Refill  . Ascorbic Acid (VITAMIN C) 500 MG tablet Take 500 mg by mouth 2 (two) times daily.     Marland Kitchen aspirin EC 81 MG tablet  Take 81 mg by mouth daily.    . cetirizine (ZYRTEC) 10 MG tablet Take 10 mg by mouth daily as needed for allergies. As needed    . cholecalciferol (VITAMIN D) 1000 UNITS tablet Take 1,000 Units by mouth daily.    . clotrimazole (LOTRIMIN) 1 % cream Apply 1 application topically 2 (two) times daily as needed. Prescribed for rash in groin from [place] on 06/20/2014    . clotrimazole-betamethasone (LOTRISONE) cream Use as directed twice per day as needed (Patient taking differently: Apply 1 application topically 2 (two) times daily. ) 15 g 1  . docusate sodium (COLACE) 100 MG capsule Take 1 capsule (100 mg total) by mouth 2 (two) times daily. 60 capsule 0  . losartan (COZAAR) 50 MG tablet TAKE 1 TABLET EVERY DAY 90 tablet 1  . methylcellulose (ARTIFICIAL TEARS) 1 % ophthalmic solution Place 1 drop into both eyes 2 (two) times daily as needed (dry eyes).     . nitroGLYCERIN (NITROSTAT) 0.4 MG SL tablet Place 1 tablet (0.4 mg total) under the tongue every 5 (five) minutes as needed. Chest pain 60 tablet 5  . omeprazole (PRILOSEC) 20 MG capsule Take 20 mg by mouth 2 (two) times daily before a meal.    . oxyCODONE-acetaminophen (PERCOCET) 5-325 MG per tablet Take 1 tablet by mouth every 4 (four) hours as needed. 60 tablet 0  . Travoprost, BAK Free, (TRAVATAN Z) 0.004 % SOLN ophthalmic solution Place 1 drop into both eyes at bedtime. 5 mL 5  . predniSONE (STERAPRED UNI-PAK 21 TAB) 5 MG (21) TBPK tablet      No current facility-administered medications on file prior to visit.   Review of Systems Constitutional: Negative for increased diaphoresis, other activity, appetite or siginficant weight change other than noted HENT: Negative for worsening hearing loss, ear pain, facial swelling, mouth sores and neck stiffness.   Eyes: Negative for other worsening pain, redness or visual disturbance.  Respiratory: Negative for shortness of breath and wheezing  Cardiovascular: Negative for chest pain and palpitations.    Gastrointestinal: Negative for diarrhea, blood in stool, abdominal distention or other pain Genitourinary: Negative for hematuria, flank pain or change in urine volume.  Musculoskeletal: Negative for myalgias or other joint complaints.  Skin: Negative for color change and wound or drainage.  Neurological: Negative for syncope and numbness. other than noted Hematological: Negative for adenopathy. or other swelling Psychiatric/Behavioral: Negative for hallucinations, SI, self-injury, decreased concentration or other worsening agitation.      Objective:   Physical Exam BP 130/80 mmHg  Pulse 57  Temp(Src) 98.5 F (36.9 C) (Oral)  Ht 5\' 6"  (1.676 m)  Wt 194 lb (87.998 kg)  BMI 31.33 kg/m2  SpO2 95% VS noted, wearing back brace today Constitutional: Pt is oriented to person, place, and time. Appears well-developed and well-nourished, in no  significant distress Head: Normocephalic and atraumatic.  Right Ear: External ear normal.  Left Ear: External ear normal.  Nose: Nose normal.  Mouth/Throat: Oropharynx is clear and moist.  Eyes: Conjunctivae and EOM are normal. Pupils are equal, round, and reactive to light.  Neck: Normal range of motion. Neck supple. No JVD present. No tracheal deviation present or significant neck LA or mass Cardiovascular: Normal rate, regular rhythm, normal heart sounds and intact distal pulses.   Pulmonary/Chest: Effort normal and breath sounds without rales or wheezing  Abdominal: Soft. Bowel sounds are normal. NT. No HSM  Musculoskeletal: Normal range of motion. Exhibits no edema.  Lymphadenopathy:  Has no cervical adenopathy.  Neurological: Pt is alert and oriented to person, place, and time. Pt has normal reflexes. No cranial nerve deficit. Motor grossly intact Skin: Skin is warm and dry. No rash noted.  Psychiatric:  Has normal mood and affect. Behavior is normal.     Assessment & Plan:

## 2015-05-17 NOTE — Patient Instructions (Signed)
Please continue all other medications as before, and refills have been done if requested.  Please have the pharmacy call with any other refills you may need.  Please continue your efforts at being more active, low cholesterol diet, and weight control.  You are otherwise up to date with prevention measures today.  Please keep your appointments with your specialists as you may have planned  No further lab work needed today;  Please have the [facility] forward your most recent labs if you can  Please return in 6 months, or sooner if needed

## 2015-05-25 DIAGNOSIS — M5136 Other intervertebral disc degeneration, lumbar region: Secondary | ICD-10-CM | POA: Diagnosis not present

## 2015-05-25 DIAGNOSIS — M4806 Spinal stenosis, lumbar region: Secondary | ICD-10-CM | POA: Diagnosis not present

## 2015-05-25 DIAGNOSIS — M1288 Other specific arthropathies, not elsewhere classified, other specified site: Secondary | ICD-10-CM | POA: Diagnosis not present

## 2015-05-25 DIAGNOSIS — M961 Postlaminectomy syndrome, not elsewhere classified: Secondary | ICD-10-CM | POA: Diagnosis not present

## 2015-05-29 DIAGNOSIS — H25812 Combined forms of age-related cataract, left eye: Secondary | ICD-10-CM | POA: Diagnosis not present

## 2015-05-29 DIAGNOSIS — H2512 Age-related nuclear cataract, left eye: Secondary | ICD-10-CM | POA: Diagnosis not present

## 2015-05-29 DIAGNOSIS — H5703 Miosis: Secondary | ICD-10-CM | POA: Diagnosis not present

## 2015-05-29 DIAGNOSIS — H21561 Pupillary abnormality, right eye: Secondary | ICD-10-CM | POA: Diagnosis not present

## 2015-05-29 DIAGNOSIS — H25012 Cortical age-related cataract, left eye: Secondary | ICD-10-CM | POA: Diagnosis not present

## 2015-05-29 DIAGNOSIS — H401230 Low-tension glaucoma, bilateral, stage unspecified: Secondary | ICD-10-CM | POA: Diagnosis not present

## 2015-05-29 DIAGNOSIS — H25042 Posterior subcapsular polar age-related cataract, left eye: Secondary | ICD-10-CM | POA: Diagnosis not present

## 2015-06-11 DIAGNOSIS — N401 Enlarged prostate with lower urinary tract symptoms: Secondary | ICD-10-CM | POA: Diagnosis not present

## 2015-06-11 DIAGNOSIS — R3912 Poor urinary stream: Secondary | ICD-10-CM | POA: Diagnosis not present

## 2015-06-11 DIAGNOSIS — C670 Malignant neoplasm of trigone of bladder: Secondary | ICD-10-CM | POA: Diagnosis not present

## 2015-06-13 ENCOUNTER — Ambulatory Visit (INDEPENDENT_AMBULATORY_CARE_PROVIDER_SITE_OTHER): Payer: Medicare Other | Admitting: Podiatry

## 2015-06-13 DIAGNOSIS — B351 Tinea unguium: Secondary | ICD-10-CM

## 2015-06-13 DIAGNOSIS — M79676 Pain in unspecified toe(s): Secondary | ICD-10-CM

## 2015-06-13 NOTE — Progress Notes (Signed)
Subjective:     Patient ID: [name], male   DOB: Jan 11, 1936, 79 y.o.   MRN: [id number]  HPI patient presents with nail disease 1-5 both feet the become irritated thick yellow and are brittle at times with pain   Review of Systems     Objective:   Physical Exam Mycotic nail infection with thick yellow brittle nailbeds 1-5 both feet with pain    Assessment:     Mycotic nail infection with pain    Plan:     Debris nailbeds 1-5 both feet with no iatrogenic bleeding noted

## 2015-06-15 DIAGNOSIS — M47816 Spondylosis without myelopathy or radiculopathy, lumbar region: Secondary | ICD-10-CM | POA: Diagnosis not present

## 2015-06-15 DIAGNOSIS — M961 Postlaminectomy syndrome, not elsewhere classified: Secondary | ICD-10-CM | POA: Diagnosis not present

## 2015-06-20 ENCOUNTER — Other Ambulatory Visit: Payer: Self-pay | Admitting: Urology

## 2015-06-29 DIAGNOSIS — M1288 Other specific arthropathies, not elsewhere classified, other specified site: Secondary | ICD-10-CM | POA: Diagnosis not present

## 2015-06-29 DIAGNOSIS — M4806 Spinal stenosis, lumbar region: Secondary | ICD-10-CM | POA: Diagnosis not present

## 2015-06-29 DIAGNOSIS — M5136 Other intervertebral disc degeneration, lumbar region: Secondary | ICD-10-CM | POA: Diagnosis not present

## 2015-06-29 DIAGNOSIS — M961 Postlaminectomy syndrome, not elsewhere classified: Secondary | ICD-10-CM | POA: Diagnosis not present

## 2015-07-01 ENCOUNTER — Emergency Department (HOSPITAL_COMMUNITY): Payer: Medicare Other

## 2015-07-01 ENCOUNTER — Observation Stay (HOSPITAL_COMMUNITY)
Admission: EM | Admit: 2015-07-01 | Discharge: 2015-07-02 | Disposition: A | Discharge status: Home / Self Care | Payer: Medicare Other | Attending: Internal Medicine | Admitting: Internal Medicine

## 2015-07-01 ENCOUNTER — Encounter (HOSPITAL_COMMUNITY): Payer: Self-pay | Admitting: *Deleted

## 2015-07-01 DIAGNOSIS — N400 Enlarged prostate without lower urinary tract symptoms: Secondary | ICD-10-CM | POA: Insufficient documentation

## 2015-07-01 DIAGNOSIS — K210 Gastro-esophageal reflux disease with esophagitis: Secondary | ICD-10-CM | POA: Diagnosis not present

## 2015-07-01 DIAGNOSIS — M069 Rheumatoid arthritis, unspecified: Secondary | ICD-10-CM | POA: Diagnosis not present

## 2015-07-01 DIAGNOSIS — R404 Transient alteration of awareness: Secondary | ICD-10-CM | POA: Diagnosis not present

## 2015-07-01 DIAGNOSIS — I2510 Atherosclerotic heart disease of native coronary artery without angina pectoris: Secondary | ICD-10-CM | POA: Diagnosis not present

## 2015-07-01 DIAGNOSIS — C679 Malignant neoplasm of bladder, unspecified: Secondary | ICD-10-CM | POA: Insufficient documentation

## 2015-07-01 DIAGNOSIS — Z79891 Long term (current) use of opiate analgesic: Secondary | ICD-10-CM | POA: Diagnosis not present

## 2015-07-01 DIAGNOSIS — I951 Orthostatic hypotension: Principal | ICD-10-CM | POA: Insufficient documentation

## 2015-07-01 DIAGNOSIS — E785 Hyperlipidemia, unspecified: Secondary | ICD-10-CM | POA: Insufficient documentation

## 2015-07-01 DIAGNOSIS — Z79899 Other long term (current) drug therapy: Secondary | ICD-10-CM | POA: Diagnosis not present

## 2015-07-01 DIAGNOSIS — H5500 Unspecified nystagmus: Secondary | ICD-10-CM | POA: Insufficient documentation

## 2015-07-01 DIAGNOSIS — Z87891 Personal history of nicotine dependence: Secondary | ICD-10-CM | POA: Insufficient documentation

## 2015-07-01 DIAGNOSIS — I110 Hypertensive heart disease with heart failure: Secondary | ICD-10-CM | POA: Diagnosis not present

## 2015-07-01 DIAGNOSIS — K449 Diaphragmatic hernia without obstruction or gangrene: Secondary | ICD-10-CM | POA: Insufficient documentation

## 2015-07-01 DIAGNOSIS — E860 Dehydration: Secondary | ICD-10-CM | POA: Insufficient documentation

## 2015-07-01 DIAGNOSIS — E876 Hypokalemia: Secondary | ICD-10-CM | POA: Insufficient documentation

## 2015-07-01 DIAGNOSIS — R51 Headache: Secondary | ICD-10-CM | POA: Diagnosis not present

## 2015-07-01 DIAGNOSIS — M4806 Spinal stenosis, lumbar region: Secondary | ICD-10-CM | POA: Diagnosis not present

## 2015-07-01 DIAGNOSIS — R001 Bradycardia, unspecified: Secondary | ICD-10-CM | POA: Diagnosis not present

## 2015-07-01 DIAGNOSIS — Z7951 Long term (current) use of inhaled steroids: Secondary | ICD-10-CM | POA: Insufficient documentation

## 2015-07-01 DIAGNOSIS — Z7982 Long term (current) use of aspirin: Secondary | ICD-10-CM | POA: Insufficient documentation

## 2015-07-01 DIAGNOSIS — F329 Major depressive disorder, single episode, unspecified: Secondary | ICD-10-CM | POA: Diagnosis not present

## 2015-07-01 DIAGNOSIS — R42 Dizziness and giddiness: Secondary | ICD-10-CM | POA: Diagnosis not present

## 2015-07-01 DIAGNOSIS — M48061 Spinal stenosis, lumbar region without neurogenic claudication: Secondary | ICD-10-CM | POA: Diagnosis present

## 2015-07-01 DIAGNOSIS — I701 Atherosclerosis of renal artery: Secondary | ICD-10-CM | POA: Diagnosis not present

## 2015-07-01 DIAGNOSIS — I509 Heart failure, unspecified: Secondary | ICD-10-CM | POA: Diagnosis not present

## 2015-07-01 DIAGNOSIS — I10 Essential (primary) hypertension: Secondary | ICD-10-CM | POA: Diagnosis present

## 2015-07-01 LAB — URINALYSIS, ROUTINE W REFLEX MICROSCOPIC
Bilirubin Urine: NEGATIVE
GLUCOSE, UA: NEGATIVE mg/dL
HGB URINE DIPSTICK: NEGATIVE
Ketones, ur: NEGATIVE mg/dL
Leukocytes, UA: NEGATIVE
Nitrite: NEGATIVE
PH: 6.5 (ref 5.0–8.0)
Protein, ur: NEGATIVE mg/dL
SPECIFIC GRAVITY, URINE: 1.009 (ref 1.005–1.030)

## 2015-07-01 LAB — CBC
HCT: 42.2 % (ref 39.0–52.0)
Hemoglobin: 14.9 g/dL (ref 13.0–17.0)
MCH: 31.6 pg (ref 26.0–34.0)
MCHC: 35.3 g/dL (ref 30.0–36.0)
MCV: 89.4 fL (ref 78.0–100.0)
PLATELETS: 167 10*3/uL (ref 150–400)
RBC: 4.72 MIL/uL (ref 4.22–5.81)
RDW: 12.6 % (ref 11.5–15.5)
WBC: 5.6 10*3/uL (ref 4.0–10.5)

## 2015-07-01 LAB — CBC WITH DIFFERENTIAL/PLATELET
BASOS PCT: 0 %
Basophils Absolute: 0 10*3/uL (ref 0.0–0.1)
Eosinophils Absolute: 0.1 10*3/uL (ref 0.0–0.7)
Eosinophils Relative: 1 %
HEMATOCRIT: 42.6 % (ref 39.0–52.0)
Hemoglobin: 15.6 g/dL (ref 13.0–17.0)
Lymphocytes Relative: 17 %
Lymphs Abs: 1.3 10*3/uL (ref 0.7–4.0)
MCH: 31.8 pg (ref 26.0–34.0)
MCHC: 36.6 g/dL — AB (ref 30.0–36.0)
MCV: 86.9 fL (ref 78.0–100.0)
MONO ABS: 0.7 10*3/uL (ref 0.1–1.0)
MONOS PCT: 9 %
NEUTROS ABS: 5.8 10*3/uL (ref 1.7–7.7)
Neutrophils Relative %: 73 %
Platelets: 158 10*3/uL (ref 150–400)
RBC: 4.9 MIL/uL (ref 4.22–5.81)
RDW: 12.6 % (ref 11.5–15.5)
WBC: 7.9 10*3/uL (ref 4.0–10.5)

## 2015-07-01 LAB — TSH: TSH: 2.012 u[IU]/mL (ref 0.350–4.500)

## 2015-07-01 LAB — BASIC METABOLIC PANEL
Anion gap: 11 (ref 5–15)
BUN: 11 mg/dL (ref 6–20)
CALCIUM: 9.3 mg/dL (ref 8.9–10.3)
CO2: 30 mmol/L (ref 22–32)
CREATININE: 0.82 mg/dL (ref 0.61–1.24)
Chloride: 92 mmol/L — ABNORMAL LOW (ref 101–111)
GFR calc Af Amer: >60 mL/min (ref >60)
GFR calc non Af Amer: >60 mL/min (ref >60)
GLUCOSE: 126 mg/dL — AB (ref 65–99)
Potassium: 2.8 mmol/L — ABNORMAL LOW (ref 3.5–5.1)
Sodium: 133 mmol/L — ABNORMAL LOW (ref 135–145)

## 2015-07-01 LAB — CREATININE, SERUM
Creatinine, Ser: 0.84 mg/dL (ref 0.61–1.24)
GFR calc Af Amer: >60 mL/min (ref >60)
GFR calc non Af Amer: >60 mL/min (ref >60)

## 2015-07-01 LAB — I-STAT TROPONIN, ED: Troponin i, poc: 0 ng/mL (ref 0.00–0.08)

## 2015-07-01 LAB — PROTIME-INR
INR: 1.1 (ref 0.00–1.49)
Prothrombin Time: 14.4 seconds (ref 11.6–15.2)

## 2015-07-01 MED ORDER — SODIUM CHLORIDE 0.9 % IV SOLN: INTRAVENOUS | Status: DC

## 2015-07-01 MED ORDER — POTASSIUM CHLORIDE 10 MEQ/100ML IV SOLN
10.0000 meq | INTRAVENOUS | Status: AC
  Administered 2015-07-01 (×2): 10 meq via INTRAVENOUS
  Filled 2015-07-01 (×2): qty 100

## 2015-07-01 MED ORDER — SODIUM CHLORIDE 0.9 % IV SOLN
INTRAVENOUS | Status: AC
  Administered 2015-07-01 – 2015-07-02 (×3): via INTRAVENOUS

## 2015-07-01 MED ORDER — SODIUM CHLORIDE 0.9 % IJ SOLN
3.0000 mL | Freq: Two times a day (BID) | INTRAMUSCULAR | Status: DC
  Administered 2015-07-01: 3 mL via INTRAVENOUS

## 2015-07-01 MED ORDER — PRIMIDONE 250 MG PO TABS
250.0000 mg | ORAL_TABLET | Freq: Every day | ORAL | Status: DC
  Administered 2015-07-01: 250 mg via ORAL
  Filled 2015-07-01: qty 1

## 2015-07-01 MED ORDER — TIMOLOL MALEATE 0.5 % OP SOLN
1.0000 [drp] | Freq: Two times a day (BID) | OPHTHALMIC | Status: DC
  Administered 2015-07-01 – 2015-07-02 (×3): 1 [drp] via OPHTHALMIC
  Filled 2015-07-01: qty 5

## 2015-07-01 MED ORDER — DIAZEPAM 5 MG/ML IJ SOLN
2.0000 mg | Freq: Once | INTRAMUSCULAR | Status: AC
  Administered 2015-07-01: 2 mg via INTRAVENOUS
  Filled 2015-07-01: qty 2

## 2015-07-01 MED ORDER — GATIFLOXACIN 0.5 % OP SOLN
1.0000 [drp] | Freq: Four times a day (QID) | OPHTHALMIC | Status: DC
  Filled 2015-07-01: qty 2.5

## 2015-07-01 MED ORDER — SIMVASTATIN 40 MG PO TABS
40.0000 mg | ORAL_TABLET | Freq: Every day | ORAL | Status: DC
  Administered 2015-07-01: 40 mg via ORAL
  Filled 2015-07-01 (×2): qty 1

## 2015-07-01 MED ORDER — PREDNISOLONE ACETATE 1 % OP SUSP
1.0000 [drp] | Freq: Every day | OPHTHALMIC | Status: DC
  Filled 2015-07-01: qty 1

## 2015-07-01 MED ORDER — TRAMADOL HCL 50 MG PO TABS
50.0000 mg | ORAL_TABLET | Freq: Once | ORAL | Status: AC
  Administered 2015-07-01: 50 mg via ORAL
  Filled 2015-07-01: qty 1

## 2015-07-01 MED ORDER — KETOROLAC TROMETHAMINE 0.5 % OP SOLN
1.0000 [drp] | Freq: Four times a day (QID) | OPHTHALMIC | Status: DC
  Filled 2015-07-01: qty 3

## 2015-07-01 MED ORDER — HYDRALAZINE HCL 20 MG/ML IJ SOLN: 10.0000 mg | Freq: Four times a day (QID) | INTRAMUSCULAR | Status: DC | PRN

## 2015-07-01 MED ORDER — SODIUM CHLORIDE 0.9 % IV SOLN
INTRAVENOUS | Status: DC
Start: 2015-07-01 — End: 2015-07-01
  Administered 2015-07-01: 11:00:00 via INTRAVENOUS

## 2015-07-01 MED ORDER — DIAZEPAM 5 MG/ML IJ SOLN: 2.5000 mg | Freq: Three times a day (TID) | INTRAMUSCULAR | Status: DC | PRN

## 2015-07-01 MED ORDER — GUAIFENESIN-DM 100-10 MG/5ML PO SYRP: 5.0000 mL | ORAL_SOLUTION | ORAL | Status: DC | PRN

## 2015-07-01 MED ORDER — SODIUM CHLORIDE 0.9 % IV SOLN
INTRAVENOUS | Status: DC
  Administered 2015-07-01: 10:00:00 via INTRAVENOUS

## 2015-07-01 MED ORDER — ONDANSETRON HCL 4 MG/2ML IJ SOLN
4.0000 mg | Freq: Once | INTRAMUSCULAR | Status: AC
  Administered 2015-07-01: 4 mg via INTRAVENOUS
  Filled 2015-07-01: qty 2

## 2015-07-01 MED ORDER — SODIUM CHLORIDE 0.9 % IV BOLUS (SEPSIS)
1000.0000 mL | Freq: Once | INTRAVENOUS | Status: AC
  Administered 2015-07-01: 1000 mL via INTRAVENOUS

## 2015-07-01 MED ORDER — GABAPENTIN 300 MG PO CAPS
300.0000 mg | ORAL_CAPSULE | Freq: Three times a day (TID) | ORAL | Status: DC
  Administered 2015-07-01 – 2015-07-02 (×3): 300 mg via ORAL
  Filled 2015-07-01 (×4): qty 1

## 2015-07-01 MED ORDER — DOCUSATE SODIUM 100 MG PO CAPS
100.0000 mg | ORAL_CAPSULE | Freq: Two times a day (BID) | ORAL | Status: DC
  Administered 2015-07-01 – 2015-07-02 (×3): 100 mg via ORAL
  Filled 2015-07-01 (×3): qty 1

## 2015-07-01 MED ORDER — NITROGLYCERIN 0.4 MG SL SUBL: 0.4000 mg | SUBLINGUAL_TABLET | SUBLINGUAL | Status: DC | PRN

## 2015-07-01 MED ORDER — ACETAMINOPHEN 500 MG PO TABS
1000.0000 mg | ORAL_TABLET | Freq: Once | ORAL | Status: AC
  Administered 2015-07-01: 1000 mg via ORAL
  Filled 2015-07-01: qty 2

## 2015-07-01 MED ORDER — TAMSULOSIN HCL 0.4 MG PO CAPS
0.4000 mg | ORAL_CAPSULE | Freq: Every day | ORAL | Status: DC
  Administered 2015-07-01 – 2015-07-02 (×2): 0.4 mg via ORAL
  Filled 2015-07-01 (×2): qty 1

## 2015-07-01 MED ORDER — MECLIZINE HCL 25 MG PO TABS
50.0000 mg | ORAL_TABLET | Freq: Once | ORAL | Status: AC
  Administered 2015-07-01: 50 mg via ORAL
  Filled 2015-07-01: qty 2

## 2015-07-01 MED ORDER — ASPIRIN EC 81 MG PO TBEC
81.0000 mg | DELAYED_RELEASE_TABLET | Freq: Every day | ORAL | Status: DC
  Filled 2015-07-01 (×2): qty 1

## 2015-07-01 MED ORDER — PANTOPRAZOLE SODIUM 40 MG PO TBEC
40.0000 mg | DELAYED_RELEASE_TABLET | Freq: Every day | ORAL | Status: DC
  Administered 2015-07-01 – 2015-07-02 (×2): 40 mg via ORAL
  Filled 2015-07-01 (×2): qty 1

## 2015-07-01 MED ORDER — POTASSIUM CHLORIDE CRYS ER 20 MEQ PO TBCR
40.0000 meq | EXTENDED_RELEASE_TABLET | Freq: Once | ORAL | Status: AC
  Administered 2015-07-01: 40 meq via ORAL
  Filled 2015-07-01: qty 2

## 2015-07-01 MED ORDER — MECLIZINE HCL 25 MG PO TABS
25.0000 mg | ORAL_TABLET | Freq: Three times a day (TID) | ORAL | Status: DC
  Administered 2015-07-01 – 2015-07-02 (×4): 25 mg via ORAL
  Filled 2015-07-01 (×5): qty 1

## 2015-07-01 MED ORDER — ONDANSETRON HCL 4 MG/2ML IJ SOLN: 4.0000 mg | Freq: Four times a day (QID) | INTRAMUSCULAR | Status: DC | PRN

## 2015-07-01 MED ORDER — CITALOPRAM HYDROBROMIDE 10 MG PO TABS
10.0000 mg | ORAL_TABLET | Freq: Every morning | ORAL | Status: DC
  Administered 2015-07-01 – 2015-07-02 (×2): 10 mg via ORAL
  Filled 2015-07-01 (×2): qty 1

## 2015-07-01 MED ORDER — BRIMONIDINE TARTRATE 0.15 % OP SOLN
1.0000 [drp] | Freq: Three times a day (TID) | OPHTHALMIC | Status: DC
  Administered 2015-07-01 – 2015-07-02 (×3): 1 [drp] via OPHTHALMIC
  Filled 2015-07-01: qty 5

## 2015-07-01 MED ORDER — HYDROCODONE-ACETAMINOPHEN 5-325 MG PO TABS: 1.0000 | ORAL_TABLET | ORAL | Status: DC | PRN

## 2015-07-01 MED ORDER — ONDANSETRON HCL 4 MG PO TABS: 4.0000 mg | ORAL_TABLET | Freq: Four times a day (QID) | ORAL | Status: DC | PRN

## 2015-07-01 MED ORDER — HEPARIN SODIUM (PORCINE) 5000 UNIT/ML IJ SOLN
5000.0000 [IU] | Freq: Three times a day (TID) | INTRAMUSCULAR | Status: DC
  Administered 2015-07-01 – 2015-07-02 (×3): 5000 [IU] via SUBCUTANEOUS
  Filled 2015-07-01 (×4): qty 1

## 2015-07-01 NOTE — ED Notes (Signed)
MD at bedside. 

## 2015-07-01 NOTE — ED Notes (Signed)
Per [facility], came home around 2230 from a dance, became dizzy, had diaphoresis, sat down and dizziness did not improve, then h/a began denies blurred vision or c/p, stroke screen negative.

## 2015-07-01 NOTE — Evaluation (Signed)
Physical Therapy Evaluation Patient Details Name: [name] MRN: [id number] DOB: 1936/03/22 Today's Date: 07/01/2015   History of Present Illness  79 yo male admitted with dizziness, vertigo? Hx of multiple back surgeries-most recent being 2015  Clinical Impression  On eval, pt required Min guard assist for mobility-walked ~100'x1 with IV pole, ~140'x1 with RW. Pt did endorse "lightheadedness/"dizziness" but denied spinning. Noted pt to be unsteady especially without RW support. Vestibular evaluation completed-no nystagmus noted during any of testing. Pt did endorse dizziness with saccades testing, head-shaking but none with modified hallpike dix or supine head roll. Educated pt on slow changes in position, avoiding quick head turns. Encouraged pt to continue mobilizing with nursing as tolerated. Recommend HHPT and RW use until stability improves.     Follow Up Recommendations Home health PT;Supervision for mobility/OOB    Equipment Recommendations  Rolling walker with 5" wheels    Recommendations for Other Services       Precautions / Restrictions Precautions Precautions: Fall Restrictions Weight Bearing Restrictions: No      Mobility  Bed Mobility Overal bed mobility: Needs Assistance Bed Mobility: Supine to Sit           General bed mobility comments: supervision for safety  Transfers Overall transfer level: Needs assistance   Transfers: Sit to/from Stand Sit to Stand: Min guard         General transfer comment: close guard for safety. Increased time  Ambulation/Gait Ambulation/Gait assistance: Min guard Ambulation Distance (Feet): 140 Feet (100'x1, 140'x1) Assistive device: Rolling walker (2 wheeled) (IV pole) Gait Pattern/deviations: Step-through pattern;Decreased stride length     General Gait Details: slow gait speed. On 1st walk, pt reported "dizziness" after ~50 feet. On 2nd walk, pt tolerated ambulation a little better and RW helped provide  increased stability.   Stairs            Wheelchair Mobility    Modified Rankin (Stroke Patients Only)       Balance Overall balance assessment: Needs assistance         Standing balance support: During functional activity Standing balance-Leahy Scale: Fair                               Pertinent Vitals/Pain Pain Assessment: No/denies pain    Home Living Family/patient expects to be discharged to:: Private residence Living Arrangements: Spouse/significant other   Type of Home: House Home Access: Stairs to enter Entrance Stairs-Rails: None Technical brewer of Steps: 1 Home Layout: One level   Additional Comments: wife using rollator    Prior Function Level of Independence: Independent               Hand Dominance        Extremity/Trunk Assessment   Upper Extremity Assessment: Overall WFL for tasks assessed           Lower Extremity Assessment: Generalized weakness      Cervical / Trunk Assessment: Normal  Communication   Communication: No difficulties  Cognition Arousal/Alertness: Awake/alert Behavior During Therapy: WFL for tasks assessed/performed Overall Cognitive Status: Within Functional Limits for tasks assessed                      General Comments      Exercises        Assessment/Plan    PT Assessment Patient needs continued PT services  PT Diagnosis Difficulty walking;Generalized weakness   PT Problem List  Decreased activity tolerance;Decreased strength;Decreased balance;Decreased mobility;Decreased knowledge of use of DME  PT Treatment Interventions Gait training;Functional mobility training;Therapeutic activities;Patient/family education;Balance training;Therapeutic exercise   PT Goals (Current goals can be found in the Care Plan section) Acute Rehab PT Goals Patient Stated Goal: to continue to feel better PT Goal Formulation: With patient Time For Goal Achievement: 07/15/15 Potential to  Achieve Goals: Good    Frequency Min 3X/week   Barriers to discharge        Co-evaluation               End of Session Equipment Utilized During Treatment: Gait belt Activity Tolerance: Patient tolerated treatment well Patient left: in bed;with call bell/phone within reach;with family/visitor present      Functional Assessment Tool Used: clinical judgement Functional Limitation: Mobility: Walking and moving around Mobility: Walking and Moving Around Current Status [id number]): At least 1 percent but less than 20 percent impaired, limited or restricted Mobility: Walking and Moving Around Goal Status [phone]): At least 1 percent but less than 20 percent impaired, limited or restricted    Time: 1405-1425 PT Time Calculation (min) (ACUTE ONLY): 20 min   Charges:   PT Evaluation $Initial PT Evaluation Tier I: 1 Procedure     PT G Codes:   PT G-Codes **NOT FOR INPATIENT CLASS** Functional Assessment Tool Used: clinical judgement Functional Limitation: Mobility: Walking and moving around Mobility: Walking and Moving Around Current Status [id number]): At least 1 percent but less than 20 percent impaired, limited or restricted Mobility: Walking and Moving Around Goal Status [phone]): At least 1 percent but less than 20 percent impaired, limited or restricted    Weston Anna, MPT Pager: [phone]

## 2015-07-01 NOTE — H&P (Signed)
Patient Demographics:    [name], is a 79 y.o. male  MRN: [id number]   DOB - 1935/10/01  Admit Date - 07/01/2015  Outpatient Primary MD for the patient is Cathlean Cower, MD   With History of -  Past Medical History  Diagnosis Date  . Other chronic nonalcoholic liver disease   . Diverticulosis of colon (without mention of hemorrhage)   . Gastritis   . Esophagitis   . Abdominal tenderness, epigastric   . Bradycardia   . Chest pain   . Dizziness   . Hepatotoxicity     drug induced  . Bladder cancer ([place])   . Cough   . Colonic polyp   . Allergic rhinitis   . Renal insufficiency   . CAD (coronary artery disease)   . CHF (congestive heart failure) ([place])   . Lumbar disc disease   . PUD (peptic ulcer disease)   . Hiatal hernia     with reflux  . Rheumatoid arthritis(714.0)   . HTN (hypertension)   . HLD (hyperlipidemia)   . GERD (gastroesophageal reflux disease)   . Depression   . S/P angioplasty   . Tremors of nervous system       Past Surgical History  Procedure Laterality Date  . Adenoidectomy    . Coronary angioplasty      percutaneious transluminal  . Tonsillectomy    . Bladder surgery    . Lumbar disc surgery  1/08  . Transurethral resection of bladder tumor    . Lumbar laminectomy/decompression microdiscectomy  07/09/2011    Procedure: LUMBAR LAMINECTOMY/DECOMPRESSION MICRODISCECTOMY;  Surgeon: Johnn Hai;  Location: [facility];  Service: Orthopedics;  Laterality: Left;  Decompression L5 - S1 on the Left and repair of dura  . Back surgery    . Lumbar laminectomy/decompression microdiscectomy N/A 07/27/2013    Procedure: DECOMPRESSION L4 - L5 WITH EXCISION OF SYNOVIAL CYST AND, LATERAL MASS FUSION 1 LEVEL;  Surgeon: Johnn Hai, MD;  Location: [facility];  Service: Orthopedics;   Laterality: N/A;  . Lumbar laminectomy/decompression microdiscectomy Left 07/06/2014    Procedure:  MICRO LUMBAR DECOMPRESSION L5-S1 ON LEFT, L4-5 REDO;  Surgeon: Johnn Hai, MD;  Location: [facility];  Service: Orthopedics;  Laterality: Left;    in for   Chief Complaint  Patient presents with  . Dizziness      HPI:    [name]  is a 79 y.o. male, history of bladder cancer under the care of Dr. Risa Grill  scheduled for surgery in a few weeks, and she'll hypertension currently off medications due to labile blood pressure, GERD, BPH, CAD, nonspecific chronic CHF mentioned in the chart patient aware of any heart issues, last echo in 2010 shows a preserved EF of 55% and no LVH, history of bradycardia, who was in usual state of health, came back from an outing yesterday and when he got off of his car he  felt dizzy, his symptoms continued and came to the ER.  In the ER he was found to be dizzy with mild nystagmus, later he also tested positive for orthostatic hypotension and I was called to admit the patient. His MRI brain and CT head were nonacute. Patient currently denies any headache, no chest abdominal pain, no shortness of breath, no palpitations, no blood in stool or urine, no focal weakness. He did say that he had some ringing in his ears few days ago but currently okay, no hearing loss.    Review of systems:    In addition to the HPI above, except for mild dizziness negative review of systems No Fever-chills, No Headache, No changes with Vision or hearing, No problems swallowing food or Liquids, No Chest pain, Cough or Shortness of Breath, No Abdominal pain, No Nausea or Vommitting, Bowel movements are regular, No Blood in stool or Urine, No dysuria, No new skin rashes or bruises, No new joints pains-aches,  No new weakness, tingling, numbness in any extremity, No recent weight gain or loss, No polyuria, polydypsia or polyphagia, No significant Mental Stressors.  A full 10 point  Review of Systems was done, except as stated above, all other Review of Systems were negative.    Social History:     Social History  Substance Use Topics  . Smoking status: Former Smoker -- 1.50 packs/day for 15 years    Types: Cigarettes    Quit date: 10/07/1985  . Smokeless tobacco: Never Used  . Alcohol Use: No    Lives - at home and is fairly mobile      Family History :     Family History  Problem Relation Age of Onset  . Heart attack Mother   . Cancer Father     lung  . Cancer Sister     lung  . Diabetes Sister   . Cancer Brother     lung that spread to brain       Home Medications:   Prior to Admission medications   Medication Sig Start Date End Date Taking? Authorizing Provider  oxyCODONE-acetaminophen (PERCOCET) 5-325 MG per tablet Take 1 tablet by mouth every 4 (four) hours as needed. Patient taking differently: Take 1 tablet by mouth every 4 (four) hours as needed for moderate pain.  07/06/14  Yes Susa Day, MD  ALPHAGAN P 0.1 % SOLN instill 1 drop into both eyes twice a day 04/13/15   Historical Provider, MD  amLODipine (NORVASC) 5 MG tablet Take 1 tablet (5 mg total) by mouth every morning. 05/17/15   Biagio Borg, MD  Ascorbic Acid (VITAMIN C) 500 MG tablet Take 500 mg by mouth 2 (two) times daily.     Historical Provider, MD  aspirin EC 81 MG tablet Take 81 mg by mouth daily.    Historical Provider, MD  cetirizine (ZYRTEC) 10 MG tablet Take 10 mg by mouth daily as needed for allergies.  10/28/12   Biagio Borg, MD  cholecalciferol (VITAMIN D) 1000 UNITS tablet Take 1,000 Units by mouth daily.    Historical Provider, MD  citalopram (CELEXA) 10 MG tablet Take 1 tablet (10 mg total) by mouth every morning. 05/17/15   Biagio Borg, MD  clotrimazole (LOTRIMIN) 1 % cream Apply 1 application topically 2 (two) times daily as needed. Prescribed for rash in groin from [place] on 06/20/2014    Historical Provider, MD  clotrimazole-betamethasone (LOTRISONE) cream Use  as directed twice per day as needed Patient taking  differently: Apply 1 application topically 2 (two) times daily.  05/04/14   Biagio Borg, MD  docusate sodium (COLACE) 100 MG capsule Take 1 capsule (100 mg total) by mouth 2 (two) times daily. 07/27/13   Cecilie Kicks, PA-C  fluticasone (FLONASE) 50 MCG/ACT nasal spray USE 2 SPRAYS IN EACH NOSTRIL EVERY DAY AS NEEDED  FOR  RHINITIS/ALLERGIES 05/17/15   Biagio Borg, MD  gabapentin (NEURONTIN) 300 MG capsule TAKE 1 TO 2 CAPSULES FOUR TIMES DAILY - AFTER MEALS AND AT BEDTIME. 05/17/15   Biagio Borg, MD  hydrochlorothiazide (HYDRODIURIL) 25 MG tablet Take 1 tablet (25 mg total) by mouth daily. 05/17/15   Biagio Borg, MD  losartan (COZAAR) 50 MG tablet TAKE 1 TABLET EVERY DAY 12/14/14   Biagio Borg, MD  methylcellulose (ARTIFICIAL TEARS) 1 % ophthalmic solution Place 1 drop into both eyes 2 (two) times daily as needed (dry eyes).     Historical Provider, MD  nitroGLYCERIN (NITROSTAT) 0.4 MG SL tablet Place 1 tablet (0.4 mg total) under the tongue every 5 (five) minutes as needed. Chest pain 08/26/11   Biagio Borg, MD  omeprazole (PRILOSEC) 20 MG capsule Take 20 mg by mouth 2 (two) times daily before a meal.    Historical Provider, MD  prednisoLONE acetate (PRED FORTE) 1 % ophthalmic suspension Place 1 drop into the left eye daily. 05/24/15   Historical Provider, MD  primidone (MYSOLINE) 250 MG tablet TAKE 1/2 TABLET THREE TIMES DAILY 05/17/15   Biagio Borg, MD  PROLENSA 0.07 % SOLN Place 1 drop into the left eye daily. 05/24/15   Historical Provider, MD  simvastatin (ZOCOR) 40 MG tablet TAKE 1 AND 1/2 TABLETS AT BEDTIME 05/17/15   Biagio Borg, MD  tamsulosin (FLOMAX) 0.4 MG CAPS capsule Take 1 capsule (0.4 mg total) by mouth daily. 05/17/15   Biagio Borg, MD  timolol (TIMOPTIC) 0.5 % ophthalmic solution Place 1 drop into the left eye 2 (two) times daily. 05/30/15   Historical Provider, MD  Travoprost, BAK Free, (TRAVATAN Z) 0.004 % SOLN ophthalmic  solution Place 1 drop into both eyes at bedtime. 09/14/13   Biagio Borg, MD  VIGAMOX 0.5 % ophthalmic solution Apply 1 drop to eye daily. 05/24/15   Historical Provider, MD     Allergies:     Allergies  Allergen Reactions  . Hydrochlorothiazide Other (See Comments)    REACTION: hyponatremia with daily use  . Lisinopril Cough    10/20/13 cough  . Morphine And Related Other (See Comments)    Migraine   . Sulfonamide Derivatives Rash    REACTION: rash     Physical Exam:   Vitals  Blood pressure 168/75, pulse 52, temperature 97.5 F (36.4 C), temperature source Oral, resp. rate 16, height 5\' 7"  (1.702 m), weight 79.379 kg (175 lb), SpO2 100 %.   1. General pleasant elderly white male lying in bed in NAD,    2. Normal affect and insight, Not Suicidal or Homicidal, Awake Alert, Oriented X 3.  3. No F.N deficits, ALL C.Nerves Intact, Strength 5/5 all 4 extremities, Sensation intact all 4 extremities, Plantars down going.  4. Ears and Eyes appear Normal, Conjunctivae clear, PERRLA. Moist Oral Mucosa,  he does have  mild nystagmus with fast component on the right side,   5. Supple Neck, No JVD, No cervical lymphadenopathy appriciated, No Carotid Bruits.  6. Symmetrical Chest wall movement, Good air movement bilaterally, CTAB.  7. RRR, No  Gallops, Rubs or Murmurs, No Parasternal Heave.  8. Positive Bowel Sounds, Abdomen Soft, No tenderness, No organomegaly appriciated,No rebound -guarding or rigidity.  9.  No Cyanosis, Normal Skin Turgor, No Skin Rash or Bruise.  10. Good muscle tone,  joints appear normal , no effusions, Normal ROM.  11. No Palpable Lymph Nodes in Neck or Axillae      Data Review:    CBC  Recent Labs Lab 07/01/15 0230  WBC 7.9  HGB 15.6  HCT 42.6  PLT 158  MCV 86.9  MCH 31.8  MCHC 36.6*  RDW 12.6  LYMPHSABS 1.3  MONOABS 0.7  EOSABS 0.1  BASOSABS 0.0    ------------------------------------------------------------------------------------------------------------------  Chemistries   Recent Labs Lab 07/01/15 0230  NA 133*  K 2.8*  CL 92*  CO2 30  GLUCOSE 126*  BUN 11  CREATININE 0.82  CALCIUM 9.3   ------------------------------------------------------------------------------------------------------------------ estimated creatinine clearance is 76.2 mL/min (by C-G formula based on Cr of 0.82). ------------------------------------------------------------------------------------------------------------------ No results for input(s): TSH, T4TOTAL, T3FREE, THYROIDAB in the last 72 hours.  Invalid input(s): FREET3   Coagulation profile No results for input(s): INR, PROTIME in the last 168 hours. ------------------------------------------------------------------------------------------------------------------- No results for input(s): DDIMER in the last 72 hours. -------------------------------------------------------------------------------------------------------------------  Cardiac Enzymes No results for input(s): CKMB, TROPONINI, MYOGLOBIN in the last 168 hours.  Invalid input(s): CK ------------------------------------------------------------------------------------------------------------------ Invalid input(s): POCBNP   ---------------------------------------------------------------------------------------------------------------  Urinalysis    Component Value Date/Time   COLORURINE YELLOW 07/01/2015 0358   APPEARANCEUR CLOUDY* 07/01/2015 0358   LABSPEC 1.009 07/01/2015 0358   PHURINE 6.5 07/01/2015 0358   GLUCOSEU NEGATIVE 07/01/2015 0358   GLUCOSEU NEGATIVE 10/28/2012 1542   HGBUR NEGATIVE 07/01/2015 0358   BILIRUBINUR NEGATIVE 07/01/2015 0358   KETONESUR NEGATIVE 07/01/2015 0358   PROTEINUR NEGATIVE 07/01/2015 0358   UROBILINOGEN 0.2 10/28/2012 1542   NITRITE NEGATIVE 07/01/2015 0358   LEUKOCYTESUR  NEGATIVE 07/01/2015 0358    ----------------------------------------------------------------------------------------------------------------   Imaging Results:    Ct Head Wo Contrast  07/01/2015  CLINICAL DATA:  Headache and vertigo. EXAM: CT HEAD WITHOUT CONTRAST TECHNIQUE: Contiguous axial images were obtained from the base of the skull through the vertex without intravenous contrast. COMPARISON:  Head CT 10/06/2008 FINDINGS: No intracranial hemorrhage, mass effect, or midline shift. No hydrocephalus. The basilar cisterns are patent. No evidence of territorial infarct. No intracranial fluid collection. Calvarium is intact. Included paranasal sinuses and mastoid air cells are well aerated. IMPRESSION: No acute intracranial abnormality. Electronically Signed   By: Jeb Levering M.D.   On: 07/01/2015 03:54   Mr Brain Wo Contrast  07/01/2015  CLINICAL DATA:  New onset headache and vertigo beginning last evening. EXAM: MRI HEAD WITHOUT CONTRAST TECHNIQUE: Multiplanar, multiecho pulse sequences of the brain and surrounding structures were obtained without intravenous contrast. COMPARISON:  CT head without contrast from the same day. FINDINGS: The diffusion-weighted images demonstrate no evidence for acute or subacute infarction. Mild atrophy and periventricular white matter changes within normal limits for age. The ventricles are of normal size. No significant extra-axial fluid collection is present. The internal auditory canals are within normal limits. Flow is present in the major intracranial arteries. Bilateral lens replacements are present. The globes and orbits are otherwise unremarkable. The skullbase is within normal limits. Midline sagittal images demonstrate degenerative changes at C1-2. No focal intracranial abnormality lesion is present. IMPRESSION: Normal MRI appearance of the brain for age. No acute or focal lesion to explain the patient's headaches or vertigo. Electronically Signed   By:  Harrell Gave  Mattern M.D.   On: 07/01/2015 09:02    My personal review of EKG: Rhythm NSR, old stable non-specific ST changes   Assessment & Plan:     1.  Dizziness with patient mildly orthostatic, on diuretics at home, currently not taking his Norvasc or ARB. Also has mild nystagmus. Will be kept in 23 are observation, will gently hydrate him, TED stockings, monitor orthostatics, anti-wart scheduled with as needed IV Valium. PT evaluation for this to be a rehabilitation. Ambulate in the morning if stable discharge home. Monitor on telemetry.    2. History of CAD, CHF and bradycardia. Patient denies knowing any heart issues in the past, last echogram in the chart from 2010 shows a preserved EF of 55% without any LVH or wall motion abnormality. EKG has stable nonspecific changes, he is pain-free. Monitor on telemetry supportive care. Continue aspirin. He is noted to be on when necessary nitroglycerin which will be continued.   3. Essential hypertension. Currently not taking his blood pressure medications due to labile blood pressure, on diuretic which will be held, as needed IV hydralazine.    4. Hypokalemia. Replace and monitor. Denies any diarrhea and nausea vomiting, question if diuretic is causing him to drop his potassium and does also proving him to be mildly dehydrated causing his orthostasis.   5. Recent diagnosis of bladder cancer reoccurrence. Follows with urology, scheduled for surgery in few weeks. Outpatient follow-up with Dr. Risa Grill.   6. BPH continue Flomax.    DVT Prophylaxis Heparin    AM Labs Ordered, also please review Full Orders  Family Communication: Admission, patients condition and plan of care including tests being ordered have been discussed with the patient and family who indicate understanding and agree with the plan and Code Status.  Code Status full  Likely DC to  home in 1-2 days   Condition GUARDED     Time spent in minutes :  35    Lala Lund K M.D on 07/01/2015 at 10:48 AM  Between 7am to 7pm - Pager - [phone]  After 7pm go to [email or web address] - password [facility]  [facility] - Office  [phone]

## 2015-07-01 NOTE — ED Notes (Signed)
Pt back from MRI 

## 2015-07-01 NOTE — ED Notes (Signed)
Pt reports he came home from a dance, slow dances only, became dizzy and diaphoretic, fell against car. Pt has had continued dizziness. Has hx of similar incident years ago. Reports some intermittent tinnitus in past week. Slight nystagmus. Pt swayed slightly when he was standing for orthostatic vitals.

## 2015-07-01 NOTE — ED Notes (Signed)
Pt can go to floor  at 10:25

## 2015-07-01 NOTE — ED Notes (Signed)
Pt alert and oriented x4. Respirations even and unlabored, bilateral symmetrical rise and fall of chest. Skin warm and dry. In no acute distress. Denies needs.   

## 2015-07-01 NOTE — ED Notes (Signed)
Pt to MRI

## 2015-07-01 NOTE — ED Provider Notes (Addendum)
By signing my name below, I, Hansel Feinstein, attest that this documentation has been prepared under the direction and in the presence of Burt, DO. Electronically Signed: Hansel Feinstein, ED Scribe. 07/01/2015. 3:05 AM.   TIME SEEN: 2:47 AM   CHIEF COMPLAINT:  Chief Complaint  Patient presents with  . Dizziness     HPI:  HPI Comments: [name] is a 79 y.o. male with an extensive PMH, significant for recurrent bladder cancer not yet being treated, renal insufficiency, CAD, CHF, HTN, HLD who presents to the Emergency Department complaining of moderate, multiple episodes of dizziness that he describes as feeling like the ground was moving and he was stumbling that started this evening at 10:30PM. Pt states that he got home after dancing and got out of his car when he began to feel dizzy. He states he was stumbling with his dizziness, but there was no room spinning or faint feeling. Pt notes that he lied down and then began to feel diaphoretic. He notes that once the diaphoresis resolved, he began to have a frontal, throbbing, gradually worsening, gradual onset 8/10 HA beginning at 11:15PM. No sudden onset, thunderclap headache. Pt states that he has some mild dizziness while lying down, but it is greatly increased with standing. Pt reports that he believes his symptoms have been improving since onset. Pt states one prior episode of room-spinning dizziness with nausea in the past, which resolved on its own. He also reports some generalized fatigue for the past few weeks. No treatments tried for HA PTA. He notes that he took his Gabapentin and 81 mg ASA as normal. He notes that he did not have any difficulty moving, ambulating or dancing before the onset of dizziness.  No recent head injuries. Not on anticoagulation. No h/o DM, CAD, CHF, stroke. Family reports that his BP occasionally drops to the 90s/40s. He states with this episode tonight, his BP was 149/78.  He denies fever, chills, nausea, emesis,  diarrhea, CP, SOB, chest discomfort, numbness, paresthesia, focal weakness, otalgia, hearing loss, tinnitus. He denies dizziness with turning his head.   ROS: See HPI Constitutional: no fever, chills. Positive for diaphoresis, generalized fatigue  Eyes: no drainage  ENT: no runny nose, otalgia, tinnitus, hearing loss  Cardiovascular:  no chest pain, chest discomfort Resp: no SOB  GI: no vomiting, diarrhea, nausea GU: no dysuria Integumentary: no rash  Allergy: no hives  Musculoskeletal: no leg swelling  Neurological: no slurred speech, numbness, paresthesia, focal weakness. Positive for dizziness, HA ROS otherwise negative  PAST MEDICAL HISTORY/PAST SURGICAL HISTORY:  Past Medical History  Diagnosis Date  . Other chronic nonalcoholic liver disease   . Diverticulosis of colon (without mention of hemorrhage)   . Gastritis   . Esophagitis   . Abdominal tenderness, epigastric   . Bradycardia   . Chest pain   . Dizziness   . Hepatotoxicity     drug induced  . Bladder cancer ([place])   . Cough   . Colonic polyp   . Allergic rhinitis   . Renal insufficiency   . CAD (coronary artery disease)   . CHF (congestive heart failure) ([place])   . Lumbar disc disease   . PUD (peptic ulcer disease)   . Hiatal hernia     with reflux  . Rheumatoid arthritis(714.0)   . HTN (hypertension)   . HLD (hyperlipidemia)   . GERD (gastroesophageal reflux disease)   . Depression   . S/P angioplasty   . Tremors of nervous  system     MEDICATIONS:  Prior to Admission medications   Medication Sig Start Date End Date Taking? Authorizing Provider  ALPHAGAN P 0.1 % SOLN INT 1 GTT IN OU BID 04/13/15   Historical Provider, MD  amLODipine (NORVASC) 5 MG tablet Take 1 tablet (5 mg total) by mouth every morning. 05/17/15   Biagio Borg, MD  Ascorbic Acid (VITAMIN C) 500 MG tablet Take 500 mg by mouth 2 (two) times daily.     Historical Provider, MD  aspirin EC 81 MG tablet Take 81 mg by mouth daily.     Historical Provider, MD  cetirizine (ZYRTEC) 10 MG tablet Take 10 mg by mouth daily as needed for allergies. As needed 10/28/12   Biagio Borg, MD  cholecalciferol (VITAMIN D) 1000 UNITS tablet Take 1,000 Units by mouth daily.    Historical Provider, MD  citalopram (CELEXA) 10 MG tablet Take 1 tablet (10 mg total) by mouth every morning. 05/17/15   Biagio Borg, MD  clotrimazole (LOTRIMIN) 1 % cream Apply 1 application topically 2 (two) times daily as needed. Prescribed for rash in groin from [place] on 06/20/2014    Historical Provider, MD  clotrimazole-betamethasone (LOTRISONE) cream Use as directed twice per day as needed Patient taking differently: Apply 1 application topically 2 (two) times daily.  05/04/14   Biagio Borg, MD  docusate sodium (COLACE) 100 MG capsule Take 1 capsule (100 mg total) by mouth 2 (two) times daily. 07/27/13   Cecilie Kicks, PA-C  fluticasone (FLONASE) 50 MCG/ACT nasal spray USE 2 SPRAYS IN EACH NOSTRIL EVERY DAY AS NEEDED  FOR  RHINITIS/ALLERGIES 05/17/15   Biagio Borg, MD  gabapentin (NEURONTIN) 300 MG capsule TAKE 1 TO 2 CAPSULES FOUR TIMES DAILY - AFTER MEALS AND AT BEDTIME. 05/17/15   Biagio Borg, MD  hydrochlorothiazide (HYDRODIURIL) 25 MG tablet Take 1 tablet (25 mg total) by mouth daily. 05/17/15   Biagio Borg, MD  losartan (COZAAR) 50 MG tablet TAKE 1 TABLET EVERY DAY 12/14/14   Biagio Borg, MD  methylcellulose (ARTIFICIAL TEARS) 1 % ophthalmic solution Place 1 drop into both eyes 2 (two) times daily as needed (dry eyes).     Historical Provider, MD  nitroGLYCERIN (NITROSTAT) 0.4 MG SL tablet Place 1 tablet (0.4 mg total) under the tongue every 5 (five) minutes as needed. Chest pain 08/26/11   Biagio Borg, MD  omeprazole (PRILOSEC) 20 MG capsule Take 20 mg by mouth 2 (two) times daily before a meal.    Historical Provider, MD  oxyCODONE-acetaminophen (PERCOCET) 5-325 MG per tablet Take 1 tablet by mouth every 4 (four) hours as needed. 07/06/14   Susa Day, MD   predniSONE (STERAPRED UNI-PAK 21 TAB) 5 MG (21) TBPK tablet  11/13/14   Historical Provider, MD  primidone (MYSOLINE) 250 MG tablet TAKE 1/2 TABLET THREE TIMES DAILY 05/17/15   Biagio Borg, MD  simvastatin (ZOCOR) 40 MG tablet TAKE 1 AND 1/2 TABLETS AT BEDTIME 05/17/15   Biagio Borg, MD  tamsulosin (FLOMAX) 0.4 MG CAPS capsule Take 1 capsule (0.4 mg total) by mouth daily. 05/17/15   Biagio Borg, MD  Travoprost, BAK Free, (TRAVATAN Z) 0.004 % SOLN ophthalmic solution Place 1 drop into both eyes at bedtime. 09/14/13   Biagio Borg, MD    ALLERGIES:  Allergies  Allergen Reactions  . Hydrochlorothiazide     REACTION: hyponatremia with daily use  . Lisinopril     10/20/13  cough  . Morphine And Related     Migraine   . Sulfonamide Derivatives     REACTION: rash    SOCIAL HISTORY:  Social History  Substance Use Topics  . Smoking status: Former Smoker -- 1.50 packs/day for 15 years    Types: Cigarettes    Quit date: 10/07/1985  . Smokeless tobacco: Never Used  . Alcohol Use: No    FAMILY HISTORY: Family History  Problem Relation Age of Onset  . Heart attack Mother   . Cancer Father     lung  . Cancer Sister     lung  . Diabetes Sister   . Cancer Brother     lung that spread to brain    EXAM: BP 153/89 mmHg  Pulse 59  Temp(Src) 97.4 F (36.3 C) (Oral)  Resp 14  SpO2 97% CONSTITUTIONAL: Alert and oriented and responds appropriately to questions. Well-appearing; well-nourished, elderly, smiling, pleasant, no distress HEAD: Normocephalic EYES: Conjunctivae clear, PERRL ENT: normal nose; no rhinorrhea; moist mucous membranes; pharynx without lesions noted, TMs are clear bilaterally with no erythema, bulging, purulence or perforation, no sign of mastoiditis NECK: Supple, no meningismus, no LAD  CARD: RRR; S1 and S2 appreciated; no murmurs, no clicks, no rubs, no gallops RESP: Normal chest excursion without splinting or tachypnea; breath sounds clear and equal bilaterally; no  wheezes, no rhonchi, no rales, no hypoxia or respiratory distress, speaking full sentences ABD/GI: Normal bowel sounds; non-distended; soft, non-tender, no rebound, no guarding, no peritoneal signs BACK:  The back appears normal and is non-tender to palpation, there is no CVA tenderness EXT: Normal ROM in all joints; non-tender to palpation; no edema; normal capillary refill; no cyanosis, no calf tenderness or swelling    SKIN: Normal color for age and race; warm NEURO: Moves all extremities equally, sensation to light touch intact diffusely, cranial nerves II through XII intact. Pt becomes symptomatic when sitting upright and is unable to stand due to dizziness, states when he sits upright he sees the wall moving PSYCH: The patient's mood and manner are appropriate. Grooming and personal hygiene are appropriate.  MEDICAL DECISION MAKING: Patient here with vertiginous symptoms. Hemodynamically stable. Blood glucose was EMS in the 140s. Reports he feels his symptoms are improving. Therefore he would not be a TPA candidate given his NIH stroke scale is 0 and symptoms slowly improving. They are worse with changes in position. Suspect that this is peripheral vertigo. He does have risk factors for central vertigo. Discussed this with patient and family. Will obtain head CT, labs, urine. Will give IV fluids, meclizine, Zofran and reassess. We'll give Tylenol for his headache.  ED PROGRESS: CT head shows no acute abnormality. No intracranial hemorrhage. Labs show potassium of 2.8, will replace with oral potassium as well as IV potassium. EKG shows no interval changes. He reports some mild improvement with above medications but still unable to ambulate. Given his persistent symptoms, will discuss with neurology for recommendations. Suspect she will need an MRI of his brain.  4:50 AM  D/w Dr. Nicole Kindred who agrees that patient would not be a TPA candidate given symptoms improving, and a stroke scale of 0. He agrees  with MRI of the brain at [facility] to rule out stroke. If negative and patient is feeling better, will discharge home with outpatient follow-up. Patient and family comfortable with this plan.   4:55 AM  D/w Dr. Kathrynn Humble in the [facility] ED who agrees to accept the patient in transfer for  an MRI of the brain without contrast.   EKG Interpretation  Date/Time:  Sunday July 01 2015 01:55:03 EST Ventricular Rate:  61 PR Interval:  169 QRS Duration: 105 QT Interval:  428 QTC Calculation: 431 R Axis:   18 Text Interpretation:  Sinus rhythm Borderline ST depression, diffuse leads No significant change since last tracing Confirmed by Norval Slaven,  DO, Bee Marchiano [id number]) on 07/01/2015 4:57:46 AM         I personally performed the services described in this documentation, which was scribed in my presence. The recorded information has been reviewed and is accurate.   [place], DO 07/01/15 0503   7:00 AM  No beds in [facility] ED.  Now able to call in MRI tech to perform study here at [facility]. Discussed with Dr. Gerilyn Nestle, radiologist, who agrees.   9:15 AM  Pt's MRI shows no acute intracranial abnormality. I went back to the room to try to stay in the patient. He states he is feeling much better and his headache is almost gone after Ultram. He was given meclizine for his vertigo. Unfortunately he is unable to stand without becoming extremely symptomatic and falling backwards into the bed. I feel he will need admission for symptomatic control for his peripheral vertigo. PCP is Dr. Jeneen Rinks. We'll discuss with hospitalist for admission to tele (given hypokalemia), obs.  Delice Bison Zeke Aker, DO 07/01/15 [id number]

## 2015-07-01 NOTE — ED Notes (Signed)
Bed: [id number] Expected date:  Expected time:  Means of arrival:  Comments: dizziness

## 2015-07-01 NOTE — ED Notes (Signed)
Pt states "I have bladder cancer they found about 5 wks ago. When I got home from the dance I had to hold on to my car, I made it to the door, got it unlocked and went to the BR.  Got my clothes off, laid down, got clammy/sweaty.  After I quit sweating, my head started hurting, got me a Coke and tried to eat some Cheetos.  Just didn't feel good.  I've been real tired the last few weeks."  Pt denies dysuria.

## 2015-07-01 NOTE — ED Notes (Signed)
hospitalist at bedside

## 2015-07-02 ENCOUNTER — Telehealth: Payer: Self-pay | Admitting: *Deleted

## 2015-07-02 DIAGNOSIS — I951 Orthostatic hypotension: Secondary | ICD-10-CM | POA: Diagnosis not present

## 2015-07-02 DIAGNOSIS — I701 Atherosclerosis of renal artery: Secondary | ICD-10-CM | POA: Diagnosis not present

## 2015-07-02 DIAGNOSIS — R42 Dizziness and giddiness: Secondary | ICD-10-CM | POA: Diagnosis not present

## 2015-07-02 DIAGNOSIS — I10 Essential (primary) hypertension: Secondary | ICD-10-CM

## 2015-07-02 DIAGNOSIS — C679 Malignant neoplasm of bladder, unspecified: Secondary | ICD-10-CM

## 2015-07-02 LAB — BASIC METABOLIC PANEL
ANION GAP: 6 (ref 5–15)
BUN: 5 mg/dL — AB (ref 6–20)
CHLORIDE: 103 mmol/L (ref 101–111)
CO2: 28 mmol/L (ref 22–32)
Calcium: 8.3 mg/dL — ABNORMAL LOW (ref 8.9–10.3)
Creatinine, Ser: 0.87 mg/dL (ref 0.61–1.24)
GFR calc Af Amer: >60 mL/min (ref >60)
Glucose, Bld: 97 mg/dL (ref 65–99)
POTASSIUM: 3.9 mmol/L (ref 3.5–5.1)
SODIUM: 137 mmol/L (ref 135–145)

## 2015-07-02 LAB — MAGNESIUM: MAGNESIUM: 1.7 mg/dL (ref 1.7–2.4)

## 2015-07-02 LAB — CBC
HEMATOCRIT: 40.5 % (ref 39.0–52.0)
HEMOGLOBIN: 13.9 g/dL (ref 13.0–17.0)
MCH: 31.1 pg (ref 26.0–34.0)
MCHC: 34.3 g/dL (ref 30.0–36.0)
MCV: 90.6 fL (ref 78.0–100.0)
Platelets: 178 10*3/uL (ref 150–400)
RBC: 4.47 MIL/uL (ref 4.22–5.81)
RDW: 13 % (ref 11.5–15.5)
WBC: 5.5 10*3/uL (ref 4.0–10.5)

## 2015-07-02 MED ORDER — HYDRALAZINE HCL 10 MG PO TABS: 10.0000 mg | ORAL_TABLET | Freq: Two times a day (BID) | ORAL | Status: DC

## 2015-07-02 MED ORDER — MECLIZINE HCL 25 MG PO TABS: 25.0000 mg | ORAL_TABLET | Freq: Three times a day (TID) | ORAL | Status: DC | PRN

## 2015-07-02 NOTE — Discharge Summary (Addendum)
Physician Discharge Summary   Patient ID: [name] MRN: [id number] DOB/AGE: December 18, 1935 79 y.o.  Admit date: 07/01/2015 Discharge date: 07/02/2015  Primary Care Physician:  Cathlean Cower, MD  Discharge Diagnoses:      Near syncope    Hypokalemia    Bradycardia  . orthostatic hypotension  . RENAL ARTERY STENOSIS . Spinal stenosis of lumbar region at multiple levels . Malignant neoplasm of bladder (HCC)  Consults:  None  Recommendations for Outpatient Follow-up:  1. Home health PT, OT arranged, DME rolling walker 2. Norvasc, losartan, HCTZ discontinued, please check BMET for potassium 3. Patient placed on oral hydralazine with parameters    DIET: Heart healthy diet    Allergies:   Allergies  Allergen Reactions  . Hydrochlorothiazide Other (See Comments)    REACTION: hyponatremia with daily use  . Lisinopril Cough    10/20/13 cough  . Morphine And Related Other (See Comments)    Migraine   . Sulfonamide Derivatives Rash    REACTION: rash     DISCHARGE MEDICATIONS: Current Discharge Medication List    START taking these medications   Details  hydrALAZINE (APRESOLINE) 10 MG tablet Take 1 tablet (10 mg total) by mouth 2 (two) times daily. Qty: 60 tablet, Refills: 3    meclizine (ANTIVERT) 25 MG tablet Take 1 tablet (25 mg total) by mouth 3 (three) times daily as needed for dizziness. Qty: 90 tablet, Refills: 3      CONTINUE these medications which have NOT CHANGED   Details  oxyCODONE-acetaminophen (PERCOCET) 5-325 MG per tablet Take 1 tablet by mouth every 4 (four) hours as needed. Qty: 60 tablet, Refills: 0    ALPHAGAN P 0.1 % SOLN instill 1 drop into both eyes twice a day Refills: 0    Ascorbic Acid (VITAMIN C) 500 MG tablet Take 500 mg by mouth 2 (two) times daily.     aspirin EC 81 MG tablet Take 81 mg by mouth daily.    cetirizine (ZYRTEC) 10 MG tablet Take 10 mg by mouth daily as needed for allergies.     cholecalciferol (VITAMIN D) 1000 UNITS  tablet Take 1,000 Units by mouth daily.    citalopram (CELEXA) 10 MG tablet Take 1 tablet (10 mg total) by mouth every morning. Qty: 90 tablet, Refills: 3    clotrimazole (LOTRIMIN) 1 % cream Apply 1 application topically 2 (two) times daily as needed. Prescribed for rash in groin from [place] on 06/20/2014    clotrimazole-betamethasone (LOTRISONE) cream Use as directed twice per day as needed Qty: 15 g, Refills: 1    docusate sodium (COLACE) 100 MG capsule Take 1 capsule (100 mg total) by mouth 2 (two) times daily. Qty: 60 capsule, Refills: 0    fluticasone (FLONASE) 50 MCG/ACT nasal spray USE 2 SPRAYS IN EACH NOSTRIL EVERY DAY AS NEEDED  FOR  RHINITIS/ALLERGIES Qty: 48 g, Refills: 3    gabapentin (NEURONTIN) 300 MG capsule TAKE 1 TO 2 CAPSULES FOUR TIMES DAILY - AFTER MEALS AND AT BEDTIME. Qty: 720 capsule, Refills: 3    methylcellulose (ARTIFICIAL TEARS) 1 % ophthalmic solution Place 1 drop into both eyes 2 (two) times daily as needed (dry eyes).     nitroGLYCERIN (NITROSTAT) 0.4 MG SL tablet Place 1 tablet (0.4 mg total) under the tongue every 5 (five) minutes as needed. Chest pain Qty: 60 tablet, Refills: 5    omeprazole (PRILOSEC) 20 MG capsule Take 20 mg by mouth 2 (two) times daily before a meal.  primidone (MYSOLINE) 250 MG tablet TAKE 1/2 TABLET THREE TIMES DAILY Qty: 135 tablet, Refills: 3    simvastatin (ZOCOR) 40 MG tablet TAKE 1 AND 1/2 TABLETS AT BEDTIME Qty: 135 tablet, Refills: 3    tamsulosin (FLOMAX) 0.4 MG CAPS capsule Take 1 capsule (0.4 mg total) by mouth daily. Qty: 90 capsule, Refills: 3    timolol (TIMOPTIC) 0.5 % ophthalmic solution Place 1 drop into the left eye 2 (two) times daily. Refills: 1    Travoprost, BAK Free, (TRAVATAN Z) 0.004 % SOLN ophthalmic solution Place 1 drop into both eyes at bedtime. Qty: 5 mL, Refills: 5      STOP taking these medications     amLODipine (NORVASC) 5 MG tablet      hydrochlorothiazide (HYDRODIURIL) 25 MG tablet       losartan (COZAAR) 50 MG tablet          Brief H and P: For complete details please refer to admission H and P, but in brief [name] is a 79 y.o. male, history of bladder cancer under the care of Dr. Risa Grill scheduled for surgery in a few weeks, and she'll hypertension currently off medications due to labile blood pressure, GERD, BPH, CAD, nonspecific chronic CHF mentioned in the chart patient aware of any heart issues, last echo in 2010 shows a preserved EF of 55% and no LVH, history of bradycardia, who was in usual state of health, came back from an outing yesterday and when he got off of his car he felt dizzy, his symptoms continued and came to the ER.  In the ER he was found to be dizzy with mild nystagmus, later he also tested positive for orthostatic hypotension and I was called to admit the patient. His MRI brain and CT head were nonacute. Patient currently denies any headache, no chest abdominal pain, no shortness of breath, no palpitations, no blood in stool or urine, no focal weakness. He did say that he had some ringing in his ears few days ago but currently okay, no hearing loss.   Hospital Course:   Near syncope with  Vertigo, orthostatic hypotension, bradycardia - Patient was admitted to telemetry, ruled out for acute ACS, potassium was replaced. MRI was negative for any acute CVA. Patient was gently hydrated. He was not taking his Norvasc or losartan. All his antihypertensives were discontinued as patient was hypokalemic and orthostatic. PT evaluation was done and required home health PT. Patient was started on meclizine. Patient was also recommended to use TED hoses. He was placed on hydralazine 10mg  BID with parameters to use only if his blood pressure has been running consistently elevated over 140's/90's. Orthostatic vitals negative this morning prior to discharge, BP 136/67.    Malignant neoplasm of bladder (HCC) - Outpatient follow-up with Dr. Risa Grill    Essential  hypertension - Please see #1. Patient's BP has been stable during hospitalization without any antihypertensives    Spinal stenosis of lumbar region at multiple levels - Continue pain control as needed per outpatient regimen  Hypokalemia Likely due to HCTZ, dehydration, replaced. Hydrochlorothiazide was discontinued.   Day of Discharge BP 120/68 mmHg  Pulse 47  Temp(Src) 97.5 F (36.4 C) (Oral)  Resp 16  Ht 5\' 7"  (1.702 m)  Wt 87.635 kg (193 lb 3.2 oz)  BMI 30.25 kg/m2  SpO2 94%  Physical Exam: General: Alert and awake oriented x3 not in any acute distress. HEENT: anicteric sclera, pupils reactive to light and accommodation CVS: S1-S2 clear no murmur  rubs or gallops Chest: clear to auscultation bilaterally, no wheezing rales or rhonchi Abdomen: soft nontender, nondistended, normal bowel sounds Extremities: no cyanosis, clubbing or edema noted bilaterally Neuro: Cranial nerves II-XII intact, no focal neurological deficits   The results of significant diagnostics from this hospitalization (including imaging, microbiology, ancillary and laboratory) are listed below for reference.    LAB RESULTS: Basic Metabolic Panel:  Recent Labs Lab 07/01/15 0230 07/01/15 1120 07/02/15 0453  NA 133*  --  137  K 2.8*  --  3.9  CL 92*  --  103  CO2 30  --  28  GLUCOSE 126*  --  97  BUN 11  --  5*  CREATININE 0.82 0.84 0.87  CALCIUM 9.3  --  8.3*  MG  --   --  1.7   Liver Function Tests: No results for input(s): AST, ALT, ALKPHOS, BILITOT, PROT, ALBUMIN in the last 168 hours. No results for input(s): LIPASE, AMYLASE in the last 168 hours. No results for input(s): AMMONIA in the last 168 hours. CBC:  Recent Labs Lab 07/01/15 0230 07/01/15 1120 07/02/15 0453  WBC 7.9 5.6 5.5  NEUTROABS 5.8  --   --   HGB 15.6 14.9 13.9  HCT 42.6 42.2 40.5  MCV 86.9 89.4 90.6  PLT 158 167 178   Cardiac Enzymes: No results for input(s): CKTOTAL, CKMB, CKMBINDEX, TROPONINI in the last 168  hours. BNP: Invalid input(s): POCBNP CBG: No results for input(s): GLUCAP in the last 168 hours.  Significant Diagnostic Studies:  Ct Head Wo Contrast  07/01/2015  CLINICAL DATA:  Headache and vertigo. EXAM: CT HEAD WITHOUT CONTRAST TECHNIQUE: Contiguous axial images were obtained from the base of the skull through the vertex without intravenous contrast. COMPARISON:  Head CT 10/06/2008 FINDINGS: No intracranial hemorrhage, mass effect, or midline shift. No hydrocephalus. The basilar cisterns are patent. No evidence of territorial infarct. No intracranial fluid collection. Calvarium is intact. Included paranasal sinuses and mastoid air cells are well aerated. IMPRESSION: No acute intracranial abnormality. Electronically Signed   By: Jeb Levering M.D.   On: 07/01/2015 03:54   Mr Brain Wo Contrast  07/01/2015  CLINICAL DATA:  New onset headache and vertigo beginning last evening. EXAM: MRI HEAD WITHOUT CONTRAST TECHNIQUE: Multiplanar, multiecho pulse sequences of the brain and surrounding structures were obtained without intravenous contrast. COMPARISON:  CT head without contrast from the same day. FINDINGS: The diffusion-weighted images demonstrate no evidence for acute or subacute infarction. Mild atrophy and periventricular white matter changes within normal limits for age. The ventricles are of normal size. No significant extra-axial fluid collection is present. The internal auditory canals are within normal limits. Flow is present in the major intracranial arteries. Bilateral lens replacements are present. The globes and orbits are otherwise unremarkable. The skullbase is within normal limits. Midline sagittal images demonstrate degenerative changes at C1-2. No focal intracranial abnormality lesion is present. IMPRESSION: Normal MRI appearance of the brain for age. No acute or focal lesion to explain the patient's headaches or vertigo. Electronically Signed   By: San Morelle M.D.   On:  07/01/2015 09:02    2D ECHO:   Disposition and Follow-up: Discharge Instructions    Diet - low sodium heart healthy    Complete by:  As directed      Discharge instructions    Complete by:  As directed   Please wear Ted Hoses   Check your BP daily and log them for your primary physician follow-up.  Please  do not take Norvasc, Losartan and Hydrochlorothiazide. If your BP is consistently running above 140/90, you can start taking new BP medication called hydralazine.   You can take meclizine for dizziness as needed.     Increase activity slowly    Complete by:  As directed             DISPOSITION: Home with home health   DISCHARGE FOLLOW-UP Follow-up Information    Follow up with Cathlean Cower, MD. Schedule an appointment as soon as possible for a visit in 10 days.   Specialties:  Internal Medicine, Radiology   Why:  for hospital follow-up, obtain labs BMET   Contact information:   [address] [place] [address] [phone]        Time spent on Discharge: 30 minutes  Signed:   Wreatha Sturgeon M.D. [facility] 07/02/2015, 7:47 AM Pager: [phone]

## 2015-07-02 NOTE — Progress Notes (Signed)
Physical Therapy Treatment and Discharge from Acute PT Patient Details Name: [name] MRN: [id number] DOB: 1935/11/02 Today's Date: 07/02/2015    History of Present Illness 79 yo male admitted with dizziness, vertigo? Hx of multiple back surgeries-most recent being 2015    PT Comments    Pt tolerating mobility very well and denies any symptoms today.  Also performed head shaking and saccades without symptoms.  Pt reports feeling much better and agrees to no further PT needs at this time.  Pt has met all goals and will d/c from acute PT.  No f/u PT needs identified at this time however pt aware to ask for PT if symptoms return.   Follow Up Recommendations  No PT follow up     Equipment Recommendations  None recommended by PT    Recommendations for Other Services       Precautions / Restrictions Precautions Precautions: Fall    Mobility  Bed Mobility Overal bed mobility: Modified Independent                Transfers Overall transfer level: Modified independent                  Ambulation/Gait Ambulation/Gait assistance: Supervision;Modified independent (Device/Increase time) Ambulation Distance (Feet): 350 Feet Assistive device: None Gait Pattern/deviations: WFL(Within Functional Limits)     General Gait Details: no symptoms reports, able to ambulate without unsteady gait or LOB   Stairs            Wheelchair Mobility    Modified Rankin (Stroke Patients Only)       Balance             Standing balance-Leahy Scale: Good                      Cognition Arousal/Alertness: Awake/alert Behavior During Therapy: WFL for tasks assessed/performed Overall Cognitive Status: Within Functional Limits for tasks assessed                      Exercises      General Comments        Pertinent Vitals/Pain Pain Assessment: No/denies pain    Home Living                      Prior Function            PT  Goals (current goals can now be found in the care plan section) Progress towards PT goals: Goals met/education completed, patient discharged from PT    Frequency       PT Plan Other (comment) (d/c from PT)    Co-evaluation             End of Session Equipment Utilized During Treatment: Gait belt Activity Tolerance: Patient tolerated treatment well Patient left: in bed;with call bell/phone within reach;with family/visitor present     Time: 7253-6644 PT Time Calculation (min) (ACUTE ONLY): 10 min  Charges:  $Gait Training: 8-22 mins                    G Codes:  Functional Assessment Tool Used: clinical judgement Functional Limitation: Mobility: Walking and moving around Mobility: Walking and Moving Around Current Status ([id number]): 0 percent impaired, limited or restricted Mobility: Walking and Moving Around Goal Status [phone]): At least 1 percent but less than 20 percent impaired, limited or restricted Mobility: Walking and Moving Around Discharge Status [phone]): 0 percent impaired, limited or  restricted   Shephanie Romas,KATHrine E 07/02/2015, 12:53 PM Carmelia Bake, PT, DPT 07/02/2015 Pager: [phone]

## 2015-07-02 NOTE — Progress Notes (Deleted)
PT Cancellation Note  Patient Details Name: [name] MRN: [id number] DOB: 1936-01-24   Cancelled Treatment:        York Ram E 07/02/2015, 12:53 PM

## 2015-07-02 NOTE — Discharge Instructions (Signed)
Dizziness °Dizziness is a common problem. It is a feeling of unsteadiness or light-headedness. You may feel like you are about to faint. Dizziness can lead to injury if you stumble or fall. Anyone can become dizzy, but dizziness is more common in older adults. This condition can be caused by a number of things, including medicines, dehydration, or illness. °HOME CARE INSTRUCTIONS °Taking these steps may help with your condition: °Eating and Drinking °· Drink enough fluid to keep your urine clear or pale yellow. This helps to keep you from becoming dehydrated. Try to drink more clear fluids, such as water. °· Do not drink alcohol. °· Limit your caffeine intake if directed by your health care provider. °· Limit your salt intake if directed by your health care provider. °Activity °· Avoid making quick movements. °· Rise slowly from chairs and steady yourself until you feel okay. °· In the morning, first sit up on the side of the bed. When you feel okay, stand slowly while you hold onto something until you know that your balance is fine. °· Move your legs often if you need to stand in one place for a long time. Tighten and relax your muscles in your legs while you are standing. °· Do not drive or operate heavy machinery if you feel dizzy. °· Avoid bending down if you feel dizzy. Place items in your home so that they are easy for you to reach without leaning over. °Lifestyle °· Do not use any tobacco products, including cigarettes, chewing tobacco, or electronic cigarettes. If you need help quitting, ask your health care provider. °· Try to reduce your stress level, such as with yoga or meditation. Talk with your health care provider if you need help. °General Instructions °· Watch your dizziness for any changes. °· Take medicines only as directed by your health care provider. Talk with your health care provider if you think that your dizziness is caused by a medicine that you are taking. °· Tell a friend or a family  member that you are feeling dizzy. If he or she notices any changes in your behavior, have this person call your health care provider. °· Keep all follow-up visits as directed by your health care provider. This is important. °SEEK MEDICAL CARE IF: °· Your dizziness does not go away. °· Your dizziness or light-headedness gets worse. °· You feel nauseous. °· You have reduced hearing. °· You have new symptoms. °· You are unsteady on your feet or you feel like the room is spinning. °SEEK IMMEDIATE MEDICAL CARE IF: °· You vomit or have diarrhea and are unable to eat or drink anything. °· You have problems talking, walking, swallowing, or using your arms, hands, or legs. °· You feel generally weak. °· You are not thinking clearly or you have trouble forming sentences. It may take a friend or family member to notice this. °· You have chest pain, abdominal pain, shortness of breath, or sweating. °· Your vision changes. °· You notice any bleeding. °· You have a headache. °· You have neck pain or a stiff neck. °· You have a fever. °  °This information is not intended to replace advice given to you by your health care provider. Make sure you discuss any questions you have with your health care provider. °  °Document Released: 12/24/2000 Document Revised: 11/14/2014 Document Reviewed: 06/26/2014 °Elsevier Interactive Patient Education ©2016 [employer] ° °Benign Positional Vertigo °Vertigo is the feeling that you or your surroundings are moving when they are not.   Benign positional vertigo is the most common form of vertigo. The cause of this condition is not serious (is benign). This condition is triggered by certain movements and positions (is positional). This condition can be dangerous if it occurs while you are doing something that could endanger you or others, such as driving.  °CAUSES °In many cases, the cause of this condition is not known. It may be caused by a disturbance in an area of the inner ear that helps your  brain to sense movement and balance. This disturbance can be caused by a viral infection (labyrinthitis), head injury, or repetitive motion. °RISK FACTORS °This condition is more likely to develop in: °· Women. °· People who are 50 years of age or older. °SYMPTOMS °Symptoms of this condition usually happen when you move your head or your eyes in different directions. Symptoms may start suddenly, and they usually last for less than a minute. Symptoms may include: °· Loss of balance and falling. °· Feeling like you are spinning or moving. °· Feeling like your surroundings are spinning or moving. °· Nausea and vomiting. °· Blurred vision. °· Dizziness. °· Involuntary eye movement (nystagmus). °Symptoms can be mild and cause only slight annoyance, or they can be severe and interfere with daily life. Episodes of benign positional vertigo may return (recur) over time, and they may be triggered by certain movements. Symptoms may improve over time. °DIAGNOSIS °This condition is usually diagnosed by medical history and a physical exam of the head, neck, and ears. You may be referred to a health care provider who specializes in ear, nose, and throat (ENT) problems (otolaryngologist) or a provider who specializes in disorders of the nervous system (neurologist). You may have additional testing, including: °· MRI. °· A CT scan. °· Eye movement tests. Your health care provider may ask you to change positions quickly while he or she watches you for symptoms of benign positional vertigo, such as nystagmus. Eye movement may be tested with an electronystagmogram (ENG), caloric stimulation, the Dix-Hallpike test, or the roll test. °· An electroencephalogram (EEG). This records electrical activity in your brain. °· Hearing tests. °TREATMENT °Usually, your health care provider will treat this by moving your head in specific positions to adjust your inner ear back to normal. Surgery may be needed in severe cases, but this is rare. In  some cases, benign positional vertigo may resolve on its own in 2-4 weeks. °HOME CARE INSTRUCTIONS °Safety °· Move slowly. Avoid sudden body or head movements. °· Avoid driving. °· Avoid operating heavy machinery. °· Avoid doing any tasks that would be dangerous to you or others if a vertigo episode would occur. °· If you have trouble walking or keeping your balance, try using a cane for stability. If you feel dizzy or unstable, sit down right away. °· Return to your normal activities as told by your health care provider. Ask your health care provider what activities are safe for you. °General Instructions °· Take over-the-counter and prescription medicines only as told by your health care provider. °· Avoid certain positions or movements as told by your health care provider. °· Drink enough fluid to keep your urine clear or pale yellow. °· Keep all follow-up visits as told by your health care provider. This is important. °SEEK MEDICAL CARE IF: °· You have a fever. °· Your condition gets worse or you develop new symptoms. °· Your family or friends notice any behavioral changes. °· Your nausea or vomiting gets worse. °· You have numbness or a "  pins and needles" sensation. °SEEK IMMEDIATE MEDICAL CARE IF: °· You have difficulty speaking or moving. °· You are always dizzy. °· You faint. °· You develop severe headaches. °· You have weakness in your legs or arms. °· You have changes in your hearing or vision. °· You develop a stiff neck. °· You develop sensitivity to light. °  °This information is not intended to replace advice given to you by your health care provider. Make sure you discuss any questions you have with your health care provider. °  °Document Released: 04/07/2006 Document Revised: 03/21/2015 Document Reviewed: 10/23/2014 °Elsevier Interactive Patient Education ©2016 Elsevier Inc. ° °

## 2015-07-02 NOTE — Care Management Note (Signed)
Case Management Note  Patient Details  Name: LIANDRO LANKA MRN: [id number] Date of Birth: [birth year]  Subjective/Objective:    79 y/o m admitted w/Vertigo.  PT recc HH,rw confirmed w/patient he did not want [place] or home rolling walker,states he has 1 @ home.            Action/Plan:d/c home no needs.   Expected Discharge Date:               Expected Discharge Plan:  Home/Self Care  In-House Referral:     Discharge planning Services  CM Consult  Post Acute Care Choice:    Choice offered to:     DME Arranged:    DME Agency:     HH Arranged:  Patient Refused [facility] Agency:     Status of Service:  Completed, signed off  Medicare Important Message Given:    Date Medicare IM Given:    Medicare IM give by:    Date Additional Medicare IM Given:    Additional Medicare Important Message give by:     If discussed at [place] of Stay Meetings, dates discussed:    Additional Comments:  Dessa Phi, RN 07/02/2015, 2:02 PM

## 2015-07-02 NOTE — Telephone Encounter (Signed)
Transition Care Management Follow-up Telephone Call   Date discharged? 07/05/15   How have you been since you were released from the hospital? Pt states he is doing ok   Do you understand why you were in the hospital? YES   Do you understand the discharge instructions? YES   Where were you discharged to? Home   Items Reviewed:  Medications reviewed: YES  Allergies reviewed: YES  Dietary changes reviewed: NO  Referrals reviewed: YES, but he stated nop one has contaced them yet   Functional Questionnaire:   Activities of Daily Living (ADLs):   He states he are independent in the following: bathing and hygiene, feeding, continence, grooming, toileting and dressing States he require assistance with the following: ambulation   Any transportation issues/concerns?: NO   Any patient concerns? NO   Confirmed importance and date/time of follow-up visits scheduled YES, appt made 07/05/15  Provider Appointment booked with Dr. Jenny Reichmann  Confirmed with patient if condition begins to worsen call PCP or go to the ER.  Patient was given the office number and encouraged to call back with question or concerns.  : YES

## 2015-07-04 ENCOUNTER — Encounter (HOSPITAL_BASED_OUTPATIENT_CLINIC_OR_DEPARTMENT_OTHER): Payer: Self-pay | Admitting: *Deleted

## 2015-07-05 ENCOUNTER — Encounter: Payer: Self-pay | Admitting: Internal Medicine

## 2015-07-05 ENCOUNTER — Encounter (HOSPITAL_BASED_OUTPATIENT_CLINIC_OR_DEPARTMENT_OTHER): Payer: Self-pay | Admitting: *Deleted

## 2015-07-05 ENCOUNTER — Ambulatory Visit (INDEPENDENT_AMBULATORY_CARE_PROVIDER_SITE_OTHER): Payer: Medicare Other | Admitting: Internal Medicine

## 2015-07-05 VITALS — BP 138/80 | HR 57 | Temp 98.1°F | Ht 67.0 in | Wt 191.0 lb

## 2015-07-05 DIAGNOSIS — R42 Dizziness and giddiness: Secondary | ICD-10-CM

## 2015-07-05 DIAGNOSIS — R05 Cough: Secondary | ICD-10-CM

## 2015-07-05 DIAGNOSIS — R7302 Impaired glucose tolerance (oral): Secondary | ICD-10-CM

## 2015-07-05 DIAGNOSIS — R059 Cough, unspecified: Secondary | ICD-10-CM

## 2015-07-05 DIAGNOSIS — I10 Essential (primary) hypertension: Secondary | ICD-10-CM | POA: Diagnosis not present

## 2015-07-05 DIAGNOSIS — Z23 Encounter for immunization: Secondary | ICD-10-CM

## 2015-07-05 MED ORDER — LEVOFLOXACIN 250 MG PO TABS: 250.0000 mg | ORAL_TABLET | Freq: Every day | ORAL | Status: DC

## 2015-07-05 MED ORDER — HYDROCODONE-HOMATROPINE 5-1.5 MG/5ML PO SYRP: 5.0000 mL | ORAL_SOLUTION | Freq: Four times a day (QID) | ORAL | Status: DC | PRN

## 2015-07-05 MED ORDER — DIPHENOXYLATE-ATROPINE 2.5-0.025 MG PO TABS: 1.0000 | ORAL_TABLET | Freq: Four times a day (QID) | ORAL | Status: DC | PRN

## 2015-07-05 MED ORDER — CEFTRIAXONE SODIUM 1 G IJ SOLR
1.0000 g | Freq: Once | INTRAMUSCULAR | Status: AC
  Administered 2015-07-05: 1 g via INTRAMUSCULAR

## 2015-07-05 NOTE — Progress Notes (Signed)
Pre visit review using our clinic review tool, if applicable. No additional management support is needed unless otherwise documented below in the visit note. 

## 2015-07-05 NOTE — Assessment & Plan Note (Signed)
Mild to mod, c/w bronchitis vs pna - for rocephin IM, levaquin asd, cough med prn, to f/u any worsening symptoms or concerns

## 2015-07-05 NOTE — Patient Instructions (Signed)
You had the antibiotic shot today (rocephin)  Please take all new medication as prescribed - the antibiotic, cough medicine, and lomotil if needed  Please continue all other medications as before, and refills have been done if requested.  Please have the pharmacy call with any other refills you may need.  Please continue your efforts at being more active, low cholesterol diet, and weight control.  Please keep your appointments with your specialists as you may have planned

## 2015-07-05 NOTE — Progress Notes (Addendum)
Subjective:    Patient ID: [name], male    DOB: Dec 22, 1935, 79 y.o.   MRN: [id number]  HPI  Here to f/u, overall improved with respect to dizziness and orthostasis resolved symptomatically it seems. Hospd dec 19 to 20 found to have symtpomatic orthostasis and bradycarida, ruled out for ACS, potassium replaced as was low, MRI neg for any acute CVA.  Was gently hydrated.  All BP meds were initially stopped. PT evaluation done and found to require [facility].  Tx with meclizine and TED hose recommended.  Placed on hydralazine 10 bid with parameters to use prn elv BP > 140/90, asked to f/u with urology Dr Risa Grill for bladder ca.  HCTZ not restarted.  I have reviewed labs  And imaging. MRI: IMPRESSION: Normal MRI appearance of the brain for age. No acute or focal lesion to explain the patient's headaches or vertigo.  Lab Results  Component Value Date   WBC 5.5 07/02/2015   HGB 16.0 07/11/2015   HCT 47.0 07/11/2015   PLT 178 07/02/2015   GLUCOSE 104* 07/13/2015   CHOL 147 10/28/2012   TRIG 226.0* 10/28/2012   HDL 32.50* 10/28/2012   LDLDIRECT 93.6 10/28/2012   LDLCALC 72 09/12/2010   ALT 22 01/06/2013   AST 19 01/06/2013   NA 136 07/13/2015   K 3.4* 07/13/2015   CL 95* 07/13/2015   CREATININE 0.99 07/13/2015   BUN 11 07/13/2015   CO2 35* 07/13/2015   TSH 2.012 07/01/2015   PSA 0.29 10/28/2012   INR 1.10 07/01/2015   HGBA1C 5.7 10/28/2012       Does incidentally have new onset sypmptoms starting just post hosp d/c - Here with acute onset mild to mod 2-3 days ST, HA, general weakness and malaise, with prod cough greenish sputum, but Pt denies chest pain, increased sob or doe, wheezing, orthopnea, PND, increased LE swelling, palpitations, dizziness or syncope.  Pt denies new neurological symptoms such as new headache, or facial or extremity weakness or numbness   Pt denies polydipsia, polyuria Past Medical History  Diagnosis Date  . Diverticulosis of colon (without mention of  hemorrhage)   . Bradycardia   . Bladder cancer ([place])   . Allergic rhinitis   . CAD (coronary artery disease)   . CHF (congestive heart failure) ([place])   . Rheumatoid arthritis(714.0)   . HTN (hypertension)   . HLD (hyperlipidemia)   . GERD (gastroesophageal reflux disease)   . Depression   . History of colon polyps   . Glaucoma   . Fatty liver disease, nonalcoholic   . DDD (degenerative disc disease), lumbosacral   . History of peptic ulcer   . History of hiatal hernia   . Right renal artery stenosis ([facility])     per cath 03-14-2009 --- 40-50%  . RLS (restless legs syndrome)   . History of bladder cancer     2004--  TCC low-grade , non-invasive  . History of kidney stones   . BPH (benign prostatic hyperplasia)   . Weak urinary stream   . Nocturia   . History of exercise stress test     02-05-2007-- ETT (Clinically positive, electrically equivocal, submaximal ETT,  appropriate bp response to exercise  . Essential tremor   . Chronic low back pain   . Wears glasses   . Wears dentures     upper  . Wears partial dentures     lower  . Wears hearing aid     bilateral  .  Vertigo, intermittent   . Productive cough   . Acute bronchitis per pt no fever--  cough since discharge from hospital 07-02-2015    per pcp note 07-05-2015  mild to moderate bronchitis vs pna   Past Surgical History  Procedure Laterality Date  . Lumbar disc surgery  07-16-2005  . Lumbar laminectomy/decompression microdiscectomy  07/09/2011    Procedure: LUMBAR LAMINECTOMY/DECOMPRESSION MICRODISCECTOMY;  Surgeon: Johnn Hai;  Location: [facility];  Service: Orthopedics;  Laterality: Left;  Decompression L5 - S1 on the Left and repair of dura  . Lumbar laminectomy/decompression microdiscectomy N/A 07/27/2013    Procedure: DECOMPRESSION L4 - L5 WITH EXCISION OF SYNOVIAL CYST AND, LATERAL MASS FUSION 1 LEVEL;  Surgeon: Johnn Hai, MD;  Location: [facility];  Service: Orthopedics;  Laterality: N/A;  . Lumbar  laminectomy/decompression microdiscectomy Left 07/06/2014    Procedure:  MICRO LUMBAR DECOMPRESSION L5-S1 ON LEFT, L4-5 REDO;  Surgeon: Johnn Hai, MD;  Location: [facility];  Service: Orthopedics;  Laterality: Left;  . Cardiac catheterization  02-12-2007  dr gregg taylor    Non-obstructive CAD,  20% pLCX,  20% LAD, ef 65%  . Cardiac catheterization  03-14-2009  dr Johnsie Cancel    pLAD and mid diaginal 20-30% multiple lesions,  OM1 20%, right renal ostial 40-50%,  ef 60%  . Transurethral resection of bladder tumor  09-21-2002  . Tonsillectomy and adenoidectomy    . Hip arthroscopy w/ labral debridement Left 06-29-2000    and chondraplasty  . Transthoracic echocardiogram  09-20-2010    normal LVF, ef 55-65%/  mild MR, TR and PR/  mild LAE  . Cataract extraction w/ intraocular lens  implant, bilateral  2016    reports that he quit smoking about 32 years ago. His smoking use included Cigarettes. He has a 40 pack-year smoking history. He has never used smokeless tobacco. He reports that he does not drink alcohol or use illicit drugs. family history includes Cancer in his brother, father, and sister; Diabetes in his sister; Heart attack in his mother. Allergies  Allergen Reactions  . Hydrochlorothiazide Other (See Comments)    REACTION: hyponatremia with daily use  . Lisinopril Cough  . Morphine And Related Other (See Comments)    Migraine   . Sulfa Antibiotics Rash   Current Outpatient Prescriptions on File Prior to Visit  Medication Sig Dispense Refill  . aspirin EC 81 MG tablet Take 81 mg by mouth daily.    . cetirizine (ZYRTEC) 10 MG tablet Take 10 mg by mouth daily as needed for allergies.     . citalopram (CELEXA) 10 MG tablet Take 1 tablet (10 mg total) by mouth every morning. 90 tablet 3  . clotrimazole (LOTRIMIN) 1 % cream Apply 1 application topically 2 (two) times daily as needed. Prescribed for rash in groin from [place] on 06/20/2014    . clotrimazole-betamethasone (LOTRISONE) cream Use  as directed twice per day as needed (Patient taking differently: Apply 1 application topically 2 (two) times daily. ) 15 g 1  . docusate sodium (COLACE) 100 MG capsule Take 1 capsule (100 mg total) by mouth 2 (two) times daily. 60 capsule 0  . fluticasone (FLONASE) 50 MCG/ACT nasal spray USE 2 SPRAYS IN EACH NOSTRIL EVERY DAY AS NEEDED  FOR  RHINITIS/ALLERGIES (Patient taking differently: Place 2 sprays into both nostrils every evening. And daily in pm and prn  USE 2 SPRAYS IN EACH NOSTRIL EVERY DAY AS NEEDED  FOR  RHINITIS/ALLERGIES) 48 g 3  . gabapentin (  NEURONTIN) 300 MG capsule TAKE 1 TO 2 CAPSULES FOUR TIMES DAILY - AFTER MEALS AND AT BEDTIME. (Patient taking differently: Take 300-600 mg by mouth 4 (four) times daily. TAKE 1 TO 2 CAPSULES FOUR TIMES DAILY - AFTER MEALS AND AT BEDTIME.) 720 capsule 3  . hydrALAZINE (APRESOLINE) 10 MG tablet Take 1 tablet (10 mg total) by mouth 2 (two) times daily. (Patient taking differently: Take 10 mg by mouth as directed. Takes bid only if systolic over [id number]  Diastolic over 80) 60 tablet 3  . meclizine (ANTIVERT) 25 MG tablet Take 1 tablet (25 mg total) by mouth 3 (three) times daily as needed for dizziness. 90 tablet 3  . methylcellulose (ARTIFICIAL TEARS) 1 % ophthalmic solution Place 1 drop into both eyes 2 (two) times daily as needed (dry eyes).     . nitroGLYCERIN (NITROSTAT) 0.4 MG SL tablet Place 1 tablet (0.4 mg total) under the tongue every 5 (five) minutes as needed. Chest pain 60 tablet 5  . omeprazole (PRILOSEC) 20 MG capsule Take 20 mg by mouth 2 (two) times daily before a meal.    . primidone (MYSOLINE) 250 MG tablet TAKE 1/2 TABLET THREE TIMES DAILY (Patient taking differently: Take 125 mg by mouth 3 (three) times daily. TAKE 1/2 TABLET THREE TIMES DAILY) 135 tablet 3  . simvastatin (ZOCOR) 40 MG tablet TAKE 1 AND 1/2 TABLETS AT BEDTIME 135 tablet 3  . tamsulosin (FLOMAX) 0.4 MG CAPS capsule Take 1 capsule (0.4 mg total) by mouth daily. (Patient  taking differently: Take 0.8 mg by mouth at bedtime. ) 90 capsule 3  . timolol (TIMOPTIC) 0.5 % ophthalmic solution Place 1 drop into the left eye 2 (two) times daily.  1  . Travoprost, BAK Free, (TRAVATAN Z) 0.004 % SOLN ophthalmic solution Place 1 drop into both eyes at bedtime. (Patient taking differently: Place 1 drop into both eyes 2 (two) times daily. ) 5 mL 5  . levofloxacin (LEVAQUIN) 250 MG tablet Take 250 mg by mouth daily.    Marland Kitchen oxyCODONE-acetaminophen (PERCOCET/ROXICET) 5-325 MG tablet Take 1 tablet by mouth every 4 (four) hours as needed for severe pain.     No current facility-administered medications on file prior to visit.     Review of Systems  Constitutional: Negative for unusual diaphoresis or night sweats HENT: Negative for ringing in ear or discharge Eyes: Negative for double vision or worsening visual disturbance.  Respiratory: Negative for choking and stridor.   Gastrointestinal: Negative for vomiting or other signifcant bowel change Genitourinary: Negative for hematuria or change in urine volume.  Musculoskeletal: Negative for other MSK pain or swelling Skin: Negative for color change and worsening wound.  Neurological: Negative for tremors and numbness other than noted  Psychiatric/Behavioral: Negative for decreased concentration or agitation other than above       Objective:   Physical Exam BP 138/80 mmHg  Pulse 57  Temp(Src) 98.1 F (36.7 C) (Oral)  Ht 5\' 7"  (1.702 m)  Wt 191 lb (86.637 kg)  BMI 29.91 kg/m2  SpO2 96% VS noted, mild ill Constitutional: Pt appears in no significant distress HENT: Head: NCAT.  Right Ear: External ear normal.  Left Ear: External ear normal.  Bilat tm's with mild erythema.  Max sinus areas non tender.  Pharynx with mild erythema, no exudate  Eyes: . Pupils are equal, round, and reactive to light. Conjunctivae and EOM are normal Neck: Normal range of motion. Neck supple.  Cardiovascular: Normal rate and regular rhythm.  Pulmonary/Chest: Effort normal and breath sounds decresaed without overt rales or wheezing or rhonchi.  Abd:  Soft, NT, ND, + BS Neurological: Pt is alert. Not confused , motor grossly intact Skin: Skin is warm. No rash, no LE edema Psychiatric: Pt behavior is normal. No agitation.     Assessment & Plan:

## 2015-07-06 ENCOUNTER — Encounter (HOSPITAL_BASED_OUTPATIENT_CLINIC_OR_DEPARTMENT_OTHER): Payer: Self-pay | Admitting: *Deleted

## 2015-07-06 NOTE — Progress Notes (Signed)
NPO AFTER MN.  ARRIVE AT 0700.  NEEDS ISTAT 8.  CURRENT EKG IN CHART AND EPIC.  WILL TAKE PRILOSEC, GABAPENTIN, CELEXA, AND PRIMIDONE AM DOS W/ SIPS OF WATER.

## 2015-07-06 NOTE — Assessment & Plan Note (Signed)
stable overall by history and exam, recent data reviewed with pt, and pt to continue medical treatment as before,  to f/u any worsening symptoms or concerns Lab Results  Component Value Date   HGBA1C 5.7 10/28/2012    

## 2015-07-06 NOTE — Assessment & Plan Note (Signed)
stable overall by history and exam, recent data reviewed with pt, and pt to continue medical treatment as before,  to f/u any worsening symptoms or concerns BP Readings from Last 3 Encounters:  07/05/15 138/80  07/02/15 120/68  05/17/15 130/80

## 2015-07-06 NOTE — Assessment & Plan Note (Signed)
Resolved,  to f/u any worsening symptoms or concerns  

## 2015-07-10 NOTE — Anesthesia Preprocedure Evaluation (Addendum)
Anesthesia Evaluation  Patient identified by MRN, date of birth, ID band Patient awake    Reviewed: Allergy & Precautions, H&P , NPO status , Patient's Chart, lab work & pertinent test results  Airway Mallampati: II  TM Distance: >3 FB Neck ROM: full    Dental  (+) Edentulous Upper, Edentulous Lower, Dental Advisory Given   Pulmonary neg pulmonary ROS, former smoker,    Pulmonary exam normal breath sounds clear to auscultation       Cardiovascular hypertension, Pt. on medications and Pt. on home beta blockers + CAD and +CHF  Normal cardiovascular exam Rhythm:regular Rate:Normal  Vaso vagal syncope. Coronary angioplasty. Bradycardia. 2010 echo 55% EF  Timoptic- beta blocker Pre-op  K+ 2.5 Rhythm strip SB in 50's no PVC's noted in 10 mins. BP 142/73 standing-Left arm   Neuro/Psych vertigo negative neurological ROS  negative psych ROS   GI/Hepatic negative GI ROS, Neg liver ROS, hiatal hernia, GERD  Medicated and Controlled,  Endo/Other  negative endocrine ROS  Renal/GU negative Renal ROS  negative genitourinary   Musculoskeletal  (+) Arthritis , Rheumatoid disorders,    Abdominal   Peds  Hematology negative hematology ROS (+)   Anesthesia Other Findings   Reproductive/Obstetrics negative OB ROS                          Anesthesia Physical Anesthesia Plan  ASA: III  Anesthesia Plan: General   Post-op Pain Management:    Induction: Intravenous  Airway Management Planned: LMA  Additional Equipment:   Intra-op Plan:   Post-operative Plan:   Informed Consent:   Plan Discussed with: Surgeon  Anesthesia Plan Comments:         Anesthesia Quick Evaluation

## 2015-07-10 NOTE — H&P (Signed)
Reason For Visit    [name] presents today for annual bladder tumor follow-up with cystoscopy. He is now 79 years of age. He does have some moderate bladder overactivity with nocturia 2-3 times per evening. He is not terribly dissatisfied with his overall voiding patterns. Urinalysis today is unremarkable. His last bladder tumor follow-up was approximately 12 months ago.   History of Present Illness        Past urologic history:         79 year old male previously under the care of Dr. Serita Butcher and then Dr. Jasmine December.    Remote history of transitional cell carcinoma bladder. Per Dr. Serita Butcher he had a large noninvasive low grade tumor resected in 2004. It appears the patient was poorly compliant with follow-up but is never had a obvious recurrence. Last cystoscopy was performed in November 2014 and was without evidence of recurrent tumor.    He has a long-standing history of progressive obstructive and irritative voiding symptoms. He has been on and off alpha blocker therapy. He is currently prescribed 0.8 mg tamsulosin daily. Postvoid residual at his last visit was negligible. He is also had significant issues with erectile dysfunction. He is on oral nitrates and has had side effects from oral ED agents.      Past Medical History Problems  1. History of hypertension (Z86.79) 2. History of kidney stones (Z87.442) 3. History of osteopenia (Z87.39) 4. Personal history of bladder cancer (Z85.51) 5. Personal history of bladder cancer (Z85.51)  Surgical History Problems  1. History of Back Surgery 2. History of Cystoscopy With Resection Of Tumor 3. History of Hip Surgery 4. History of Tonsillectomy  Current Meds 1. Aspirin Low Strength 81 MG Oral Tablet Chewable;  Therapy: (Recorded:11Nov2010) to Recorded 2. Elidel 1 % External Cream; for rash in groin area;  Therapy: (Recorded:06Nov2014) to Recorded 3. Finasteride 5 MG Oral Tablet; take 1 tablet by mouth once daily;  Therapy: [id number] to (Evaluate:01Nov2015); Last [phone] Ordered 4. Fluticasone Propionate 50 MCG/ACT Nasal Suspension;  Therapy: [id number] to Recorded 5. Gabapentin 300 MG Oral Capsule; 1 tid and 2 at bedtime;  Therapy: (Recorded:08Nov2011) to Recorded 6. Hydrochlorothiazide 25 MG Oral Tablet;  Therapy: (Recorded:11Nov2010) to Recorded 7. Hyoscyamine Sulfate 0.125 MG Oral Tablet;  Therapy: 21Sep2010 to Recorded 8. Multi-Vitamin Oral Tablet;  Therapy: (Recorded:11Nov2010) to Recorded 9. Nitroglycerin 0.4 MG Sublingual Tablet Sublingual;  Therapy: [id number] to Recorded 10. Omeprazole 20 MG Oral Capsule Delayed Release;   Therapy: (Recorded:15Oct2009) to Recorded 11. Oxycodone-Acetaminophen 5-325 MG Oral Tablet;   Therapy: 08Oct2015 to Recorded 12. Primidone 250 MG Oral Tablet;   Therapy: (Recorded:11Nov2010) to Recorded 13. Simvastatin 40 MG Oral Tablet;   Therapy: (Recorded:11Nov2010) to Recorded 14. Tamsulosin HCl - 0.4 MG Oral Capsule; Take 2 capsules at bedtime;   Therapy: [id number] to (Evaluate:17Jan2017)  Requested for: 19Oct2016; Last   Rx:19Oct2016 Ordered 15. Travatan Z 0.004 % Ophthalmic Solution;   Therapy: (Recorded:11Nov2010) to Recorded 16. Vitamin D3 1000 UNIT Oral Tablet;   Therapy: (Recorded:11Nov2010) to Recorded 17. ZyrTEC Allergy 10 MG Oral Tablet;   Therapy: (Recorded:06Nov2012) to Recorded  Allergies Medication  1. Sulfa Drugs  Family History Problems  1. Family history of Death In The Family Father   37- lung ca 2. Family history of Death In The Family Mother   26- MI 3. Family history of Family Health Status Number Of Children   1 son & 1 daughter 4. Family history of Lung Cancer : Father 5. Family history of Lung Cancer : Brother 6. Family  history of Lung Cancer : Brother  Social History Problems  1. Denied: History of Alcohol Use (History) 2. Denied: History of Caffeine Use 3. Former smoker [phone])   smoked 2 ppd for 20 yrs &  quit in 1984 4. Marital History - Currently Married 5. Retired From Work  Review of [employer], constitutional, skin, eye, otolaryngeal, hematologic/lymphatic, cardiovascular, pulmonary, endocrine, musculoskeletal, gastrointestinal, neurological and psychiatric system(s) were reviewed and pertinent findings if present are noted and are otherwise negative.  Genitourinary: urinary frequency, feelings of urinary urgency, nocturia and difficulty starting the urinary stream, but no dysuria and no hematuria.  Musculoskeletal: back pain and joint pain.    Vitals Vital Signs [Data Includes: Last 1 Day]  Recorded: [id number] 02:46PM  Height: 5 ft 6 in Weight: 194 lb  BMI Calculated: 31.31 BSA Calculated: 1.97 Blood Pressure: 152 / 75 Temperature: 97.8 F Heart Rate: 51  Physical Exam Constitutional: Well nourished and well developed . No acute distress.   Pulmonary: No respiratory distress and normal respiratory rhythm and effort.   Cardiovascular: Heart rate and rhythm are normal . No peripheral edema.   Abdomen: The abdomen is soft and nontender. No masses are palpated. No CVA tenderness. No hernias are palpable. No hepatosplenomegaly noted.   Rectal: Rectal exam demonstrates normal sphincter tone, no tenderness and no masses. Estimated prostate size is 2+. Normal rectal tone, no rectal masses, prostate is smooth, symmetric and non-tender. The prostate has no nodularity and is not tender. The left seminal vesicle is nonpalpable. The right seminal vesicle is nonpalpable. The perineum is normal on inspection.   Skin: Normal skin turgor, no visible rash and no visible skin lesions.   Neuro/Psych:. Mood and affect are appropriate.    Procedure  Procedure: Cystoscopy  Chaperone Present: Katie.  Indication: Lower Urinary Tract Symptoms. History of Urothelial Carcinoma.  Informed Consent: Risks, benefits, and potential adverse events were discussed and informed consent was obtained  from the patient . Specific risks including, but not limited to bleeding, infection, pain, allergic reaction etc. were explained.  Prep: The patient was prepped with betadine.  Anesthesia:. Local anesthesia was administered intraurethrally with 2% lidocaine jelly.  Antibiotic prophylaxis: Ciprofloxacin.  Procedure Note:  Urethral meatus:. No abnormalities.  Anterior urethra: No abnormalities.  Prostatic urethra:. There was visual obstruction of the prostatic urethra. The lateral and median prostatic lobes were enlarged.  Bladder: Visulization was clear. Multiple tumors were identified in the bladder. A papillary tumor was seen in the bladder measuring approximately 1 cm in size. This tumor was located on the posterior aspect of the bladder. Another papillary tumor was seen in the bladder measuring approximately.4 cm in size. This tumor was located near the trigone of the bladder. The patient tolerated the procedure well.  Complications: None.    Assessment Assessed  1. Weak urinary stream (R39.12) 2. Benign prostatic hyperplasia with urinary obstruction (N40.1,N13.8) 3. 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 Casey Pinales has a very small tumor on his trigone and a slightly larger tumor on the posterior wall of his bladder. These tumors should be amenable to cold cup resection with fulguration. I would not anticipate a need for an indwelling catheter or an overnight stay in the hospital. I suspect these tumors will be low-grade and superficial. If the pathology does reveal low-grade superficial disease I would plan on no other treatments and follow-up cystoscopy in about 4 months.    He does continue to  have mild to moderate voiding symptoms but nothing that is particularly problematic for bothersome to him. He will remain on tamsulosin.     Signatures Electronically signed by : Rana Snare, M.D.; Jun 11 2015  3:25PM EST

## 2015-07-11 ENCOUNTER — Encounter (HOSPITAL_BASED_OUTPATIENT_CLINIC_OR_DEPARTMENT_OTHER)
Admission: RE | Disposition: A | Discharge status: Home / Self Care | Payer: Self-pay | Source: Ambulatory Visit | Attending: Urology

## 2015-07-11 ENCOUNTER — Ambulatory Visit: Payer: BLUE CROSS/BLUE SHIELD | Admitting: Internal Medicine

## 2015-07-11 ENCOUNTER — Ambulatory Visit (HOSPITAL_BASED_OUTPATIENT_CLINIC_OR_DEPARTMENT_OTHER): Payer: Medicare Other | Admitting: Anesthesiology

## 2015-07-11 ENCOUNTER — Encounter (HOSPITAL_BASED_OUTPATIENT_CLINIC_OR_DEPARTMENT_OTHER): Payer: Self-pay | Admitting: *Deleted

## 2015-07-11 ENCOUNTER — Other Ambulatory Visit: Payer: Self-pay | Admitting: Urology

## 2015-07-11 ENCOUNTER — Ambulatory Visit (HOSPITAL_BASED_OUTPATIENT_CLINIC_OR_DEPARTMENT_OTHER)
Admission: RE | Admit: 2015-07-11 | Discharge: 2015-07-11 | Disposition: A | Discharge status: Home / Self Care | Payer: Medicare Other | Source: Ambulatory Visit | Attending: Urology | Admitting: Urology

## 2015-07-11 DIAGNOSIS — I509 Heart failure, unspecified: Secondary | ICD-10-CM | POA: Insufficient documentation

## 2015-07-11 DIAGNOSIS — Z87891 Personal history of nicotine dependence: Secondary | ICD-10-CM | POA: Insufficient documentation

## 2015-07-11 DIAGNOSIS — C671 Malignant neoplasm of dome of bladder: Secondary | ICD-10-CM | POA: Diagnosis not present

## 2015-07-11 DIAGNOSIS — N401 Enlarged prostate with lower urinary tract symptoms: Secondary | ICD-10-CM | POA: Insufficient documentation

## 2015-07-11 DIAGNOSIS — Z7951 Long term (current) use of inhaled steroids: Secondary | ICD-10-CM | POA: Diagnosis not present

## 2015-07-11 DIAGNOSIS — K449 Diaphragmatic hernia without obstruction or gangrene: Secondary | ICD-10-CM | POA: Diagnosis not present

## 2015-07-11 DIAGNOSIS — Z8551 Personal history of malignant neoplasm of bladder: Secondary | ICD-10-CM | POA: Insufficient documentation

## 2015-07-11 DIAGNOSIS — K219 Gastro-esophageal reflux disease without esophagitis: Secondary | ICD-10-CM | POA: Diagnosis not present

## 2015-07-11 DIAGNOSIS — N529 Male erectile dysfunction, unspecified: Secondary | ICD-10-CM | POA: Diagnosis not present

## 2015-07-11 DIAGNOSIS — Z7982 Long term (current) use of aspirin: Secondary | ICD-10-CM | POA: Diagnosis not present

## 2015-07-11 DIAGNOSIS — Z87442 Personal history of urinary calculi: Secondary | ICD-10-CM | POA: Diagnosis not present

## 2015-07-11 DIAGNOSIS — I10 Essential (primary) hypertension: Secondary | ICD-10-CM | POA: Diagnosis not present

## 2015-07-11 DIAGNOSIS — R3912 Poor urinary stream: Secondary | ICD-10-CM | POA: Diagnosis not present

## 2015-07-11 DIAGNOSIS — Z79899 Other long term (current) drug therapy: Secondary | ICD-10-CM | POA: Diagnosis not present

## 2015-07-11 DIAGNOSIS — M1990 Unspecified osteoarthritis, unspecified site: Secondary | ICD-10-CM | POA: Insufficient documentation

## 2015-07-11 DIAGNOSIS — R351 Nocturia: Secondary | ICD-10-CM | POA: Insufficient documentation

## 2015-07-11 DIAGNOSIS — I110 Hypertensive heart disease with heart failure: Secondary | ICD-10-CM | POA: Diagnosis not present

## 2015-07-11 DIAGNOSIS — Z79891 Long term (current) use of opiate analgesic: Secondary | ICD-10-CM | POA: Diagnosis not present

## 2015-07-11 DIAGNOSIS — C679 Malignant neoplasm of bladder, unspecified: Secondary | ICD-10-CM | POA: Diagnosis not present

## 2015-07-11 DIAGNOSIS — C672 Malignant neoplasm of lateral wall of bladder: Secondary | ICD-10-CM | POA: Diagnosis not present

## 2015-07-11 DIAGNOSIS — N138 Other obstructive and reflux uropathy: Secondary | ICD-10-CM | POA: Diagnosis not present

## 2015-07-11 DIAGNOSIS — C670 Malignant neoplasm of trigone of bladder: Secondary | ICD-10-CM | POA: Insufficient documentation

## 2015-07-11 DIAGNOSIS — N3281 Overactive bladder: Secondary | ICD-10-CM | POA: Insufficient documentation

## 2015-07-11 DIAGNOSIS — I2510 Atherosclerotic heart disease of native coronary artery without angina pectoris: Secondary | ICD-10-CM | POA: Diagnosis not present

## 2015-07-11 DIAGNOSIS — Z08 Encounter for follow-up examination after completed treatment for malignant neoplasm: Secondary | ICD-10-CM | POA: Diagnosis present

## 2015-07-11 HISTORY — DX: Benign prostatic hyperplasia without lower urinary tract symptoms: N40.0

## 2015-07-11 HISTORY — DX: Presence of dental prosthetic device (complete) (partial): Z97.2

## 2015-07-11 HISTORY — DX: Other specified cough: R05.8

## 2015-07-11 HISTORY — DX: Other chronic pain: G89.29

## 2015-07-11 HISTORY — DX: Presence of spectacles and contact lenses: Z97.3

## 2015-07-11 HISTORY — DX: Personal history of other medical treatment: Z92.89

## 2015-07-11 HISTORY — DX: Personal history of colonic polyps: Z86.010

## 2015-07-11 HISTORY — DX: Unspecified glaucoma: H40.9

## 2015-07-11 HISTORY — DX: Poor urinary stream: R39.12

## 2015-07-11 HISTORY — DX: Cough: R05

## 2015-07-11 HISTORY — DX: Personal history of other diseases of the digestive system: Z87.19

## 2015-07-11 HISTORY — PX: CYSTOSCOPY WITH BIOPSY: SHX5122

## 2015-07-11 HISTORY — DX: Low back pain: M54.5

## 2015-07-11 HISTORY — DX: Restless legs syndrome: G25.81

## 2015-07-11 HISTORY — DX: Atherosclerosis of renal artery: I70.1

## 2015-07-11 HISTORY — DX: Fatty (change of) liver, not elsewhere classified: K76.0

## 2015-07-11 HISTORY — DX: Essential tremor: G25.0

## 2015-07-11 HISTORY — DX: Nocturia: R35.1

## 2015-07-11 HISTORY — DX: Personal history of urinary calculi: Z87.442

## 2015-07-11 HISTORY — DX: Personal history of malignant neoplasm of bladder: Z85.51

## 2015-07-11 HISTORY — DX: Personal history of peptic ulcer disease: Z87.11

## 2015-07-11 HISTORY — DX: Other intervertebral disc degeneration, lumbosacral region: M51.37

## 2015-07-11 HISTORY — DX: Low back pain, unspecified: M54.50

## 2015-07-11 HISTORY — DX: Other intervertebral disc degeneration, lumbosacral region without mention of lumbar back pain or lower extremity pain: M51.379

## 2015-07-11 HISTORY — DX: Acute bronchitis, unspecified: J20.9

## 2015-07-11 HISTORY — DX: Presence of external hearing-aid: Z97.4

## 2015-07-11 HISTORY — DX: Personal history of colon polyps, unspecified: Z86.0100

## 2015-07-11 HISTORY — DX: Dizziness and giddiness: R42

## 2015-07-11 LAB — POCT I-STAT, CHEM 8
BUN: 3 mg/dL — AB (ref 6–20)
CREATININE: 0.8 mg/dL (ref 0.61–1.24)
Calcium, Ion: 1.16 mmol/L (ref 1.13–1.30)
Chloride: 89 mmol/L — ABNORMAL LOW (ref 101–111)
GLUCOSE: 116 mg/dL — AB (ref 65–99)
HEMATOCRIT: 47 % (ref 39.0–52.0)
HEMOGLOBIN: 16 g/dL (ref 13.0–17.0)
Potassium: 2.5 mmol/L — CL (ref 3.5–5.1)
Sodium: 135 mmol/L (ref 135–145)
TCO2: 31 mmol/L (ref 0–100)

## 2015-07-11 LAB — POTASSIUM: Potassium: 2.4 mmol/L — CL (ref 3.5–5.1)

## 2015-07-11 SURGERY — CYSTOSCOPY, WITH BIOPSY
Anesthesia: General | Site: Bladder

## 2015-07-11 MED ORDER — ONDANSETRON HCL 4 MG/2ML IJ SOLN
INTRAMUSCULAR | Status: AC
  Filled 2015-07-11: qty 2

## 2015-07-11 MED ORDER — STERILE WATER FOR IRRIGATION IR SOLN
Status: DC | PRN
  Administered 2015-07-11: 3000 mL

## 2015-07-11 MED ORDER — DEXAMETHASONE SODIUM PHOSPHATE 10 MG/ML IJ SOLN
INTRAMUSCULAR | Status: AC
  Filled 2015-07-11: qty 1

## 2015-07-11 MED ORDER — DEXAMETHASONE SODIUM PHOSPHATE 4 MG/ML IJ SOLN
INTRAMUSCULAR | Status: DC | PRN
  Administered 2015-07-11: 10 mg via INTRAVENOUS

## 2015-07-11 MED ORDER — ONDANSETRON HCL 4 MG/2ML IJ SOLN
INTRAMUSCULAR | Status: DC | PRN
  Administered 2015-07-11: 4 mg via INTRAVENOUS

## 2015-07-11 MED ORDER — LACTATED RINGERS IV SOLN
INTRAVENOUS | Status: DC
  Administered 2015-07-11 (×2): via INTRAVENOUS
  Filled 2015-07-11: qty 1000

## 2015-07-11 MED ORDER — FENTANYL CITRATE (PF) 100 MCG/2ML IJ SOLN
INTRAMUSCULAR | Status: DC | PRN
  Administered 2015-07-11: 50 ug via INTRAVENOUS

## 2015-07-11 MED ORDER — WHITE PETROLATUM GEL
Status: AC
  Filled 2015-07-11: qty 5

## 2015-07-11 MED ORDER — LIDOCAINE HCL (CARDIAC) 20 MG/ML IV SOLN
INTRAVENOUS | Status: DC | PRN
  Administered 2015-07-11: 60 mg via INTRAVENOUS

## 2015-07-11 MED ORDER — KETOROLAC TROMETHAMINE 30 MG/ML IJ SOLN
INTRAMUSCULAR | Status: AC
  Filled 2015-07-11: qty 1

## 2015-07-11 MED ORDER — PROPOFOL 10 MG/ML IV BOLUS
INTRAVENOUS | Status: DC | PRN
  Administered 2015-07-11: 140 mg via INTRAVENOUS

## 2015-07-11 MED ORDER — CEFAZOLIN SODIUM-DEXTROSE 2-3 GM-% IV SOLR
2.0000 g | INTRAVENOUS | Status: AC
  Administered 2015-07-11: 2 g via INTRAVENOUS
  Filled 2015-07-11: qty 50

## 2015-07-11 MED ORDER — LIDOCAINE HCL (CARDIAC) 20 MG/ML IV SOLN
INTRAVENOUS | Status: AC
  Filled 2015-07-11: qty 5

## 2015-07-11 MED ORDER — POTASSIUM CHLORIDE ER 10 MEQ PO TBCR: 10.0000 meq | EXTENDED_RELEASE_TABLET | Freq: Three times a day (TID) | ORAL | Status: DC

## 2015-07-11 MED ORDER — PROPOFOL 10 MG/ML IV BOLUS
INTRAVENOUS | Status: AC
  Filled 2015-07-11: qty 40

## 2015-07-11 MED ORDER — FENTANYL CITRATE (PF) 100 MCG/2ML IJ SOLN
25.0000 ug | INTRAMUSCULAR | Status: DC | PRN
  Filled 2015-07-11: qty 1

## 2015-07-11 MED ORDER — CEFAZOLIN SODIUM-DEXTROSE 2-3 GM-% IV SOLR
INTRAVENOUS | Status: AC
  Filled 2015-07-11: qty 50

## 2015-07-11 SURGICAL SUPPLY — 17 items
BAG DRAIN URO-CYSTO SKYTR STRL (DRAIN) ×3 IMPLANT
CLOTH BEACON ORANGE TIMEOUT ST (SAFETY) ×3 IMPLANT
ELECT REM PT RETURN 9FT ADLT (ELECTROSURGICAL) ×3
ELECTRODE REM PT RTRN 9FT ADLT (ELECTROSURGICAL) ×1 IMPLANT
GLOVE BIO SURGEON STRL SZ 6.5 (GLOVE) ×2 IMPLANT
GLOVE BIO SURGEON STRL SZ7.5 (GLOVE) ×3 IMPLANT
GLOVE BIO SURGEONS STRL SZ 6.5 (GLOVE) ×1
GLOVE BIOGEL PI IND STRL 6.5 (GLOVE) ×2 IMPLANT
GLOVE BIOGEL PI INDICATOR 6.5 (GLOVE) ×4
GOWN STRL REUS W/ TWL XL LVL3 (GOWN DISPOSABLE) ×2 IMPLANT
GOWN STRL REUS W/TWL XL LVL3 (GOWN DISPOSABLE) ×4
KIT ROOM TURNOVER WOR (KITS) ×3 IMPLANT
MANIFOLD NEPTUNE II (INSTRUMENTS) ×3 IMPLANT
PACK CYSTO (CUSTOM PROCEDURE TRAY) ×3 IMPLANT
TUBE CONNECTING 12'X1/4 (SUCTIONS) ×1
TUBE CONNECTING 12X1/4 (SUCTIONS) ×2 IMPLANT
WATER STERILE IRR 3000ML UROMA (IV SOLUTION) ×3 IMPLANT

## 2015-07-11 NOTE — Op Note (Signed)
Preoperative diagnosis: Recurrent transitional cell carcinoma the bladder Postoperative diagnosis: Same  Procedure: Cold cup resection of bladder tumors with fulguration   Surgeon: Bernestine Amass M.D.  Anesthesia: Gen.  Indications: Mr. Rae has a prior history of transitional cell carcinoma the bladder. On recent follow-up cystoscopy was noted to have 2 small transitional cell carcinomas within the bladder. He presents now for resection of those tumors.     Technique and findings: Patient was brought the operating room where he had successful induction of general anesthesia. He was placed in lithotomy position and prepped and draped in usual manner. Open surgical timeout was performed. Cystoscopy was done with a rigid cystoscope. Patient had moderate trilobar hyperplasia with some visual obstruction. Examination of the bladder revealed a 1 cm tumor on the right lateral wall and a 4-5 mm tumor on the right hemitrigone. Both these tumors appeared to be papillary in nature and had a low-grade appearance and are unlikely to be invasive. Both tumors were resected with the cold cup. They were then fulgurated. Hemostasis was excellent. Patient was brought to PACU having had no obvious complications.

## 2015-07-11 NOTE — Discharge Instructions (Addendum)
Transurethral Resection of Bladder Tumor (TURBT)   Definition:  Transurethral Resection of the Bladder Tumor is a surgical procedure used to diagnose and remove tumors within the bladder. TURBT is the most common treatment for early stage bladder cancer.  General instructions:     Your recent bladder surgery requires very little post hospital care but some definite precautions.  Despite the fact that no skin incisions were used, the area around the bladder incisions are raw and covered with scabs to promote healing and prevent bleeding. Certain precautions are needed to insure that the scabs are not disturbed over the next 2-4 weeks while the healing proceeds.  Because the raw surface inside your bladder and the irritating effects of urine you may expect frequency of urination and/or urgency (a stronger desire to urinate) and perhaps even getting up at night more often. This will usually resolve or improve slowly over the healing period. You may see some blood in your urine over the first 6 weeks. Do not be alarmed, even if the urine was clear for a while. Get off your feet and drink lots of fluids until clearing occurs. If you start to pass clots or don't improve call us.  Diet:  You may return to your normal diet immediately. Because of the raw surface of your bladder, alcohol, spicy foods, foods high in acid and drinks with caffeine may cause irritation or frequency and should be used in moderation. To keep your urine flowing freely and avoid constipation, drink plenty of fluids during the day (8-10 glasses). Tip: Avoid cranberry juice because it is very acidic.  Activity:  Your physical activity doesn't need to be restricted. However, if you are very active, you may see some blood in the urine. We suggest that you reduce your activity under the circumstances until the bleeding has stopped.  Bowels:  It is important to keep your bowels regular during the postoperative period. Straining  with bowel movements can cause bleeding. A bowel movement every other day is reasonable. Use a mild laxative if needed, such as milk of magnesia 2-3 tablespoons, or 2 Dulcolax tablets. Call if you continue to have problems. If you had been taking narcotics for pain, before, during or after your surgery, you may be constipated. Take a laxative if necessary.    Medication:  You should resume your pre-surgery medications unless told not to. In addition you may be given an antibiotic to prevent or treat infection. Antibiotics are not always necessary. All medication should be taken as prescribed until the bottles are finished unless you are having an unusual reaction to one of the drugs.  Can restart aspirin in 3 days     Post Anesthesia Home Care Instructions  Activity: Get plenty of rest for the remainder of the day. A responsible adult should stay with you for 24 hours following the procedure.  For the next 24 hours, DO NOT: -Drive a car -Paediatric nurse -Drink alcoholic beverages -Take any medication unless instructed by your physician -Make any legal decisions or sign important papers.  Meals: Start with liquid foods such as gelatin or soup. Progress to regular foods as tolerated. Avoid greasy, spicy, heavy foods. If nausea and/or vomiting occur, drink only clear liquids until the nausea and/or vomiting subsides. Call your physician if vomiting continues.  Special Instructions/Symptoms: Your throat may feel dry or sore from the anesthesia or the breathing tube placed in your throat during surgery. If this causes discomfort, gargle with warm salt water. The discomfort should  disappear within 24 hours.  If you had a scopolamine patch placed behind your ear for the management of post- operative nausea and/or vomiting:  1. The medication in the patch is effective for 72 hours, after which it should be removed.  Wrap patch in a tissue and discard in the trash. Wash hands thoroughly with  soap and water. 2. You may remove the patch earlier than 72 hours if you experience unpleasant side effects which may include dry mouth, dizziness or visual disturbances. 3. Avoid touching the patch. Wash your hands with soap and water after contact with the patch.

## 2015-07-11 NOTE — Anesthesia Procedure Notes (Signed)
Procedure Name: LMA Insertion Date/Time: 07/11/2015 8:43 AM Performed by: Wanita Chamberlain Pre-anesthesia Checklist: Patient identified, Emergency Drugs available, Suction available, Patient being monitored and Timeout performed Patient Re-evaluated:Patient Re-evaluated prior to inductionOxygen Delivery Method: Circle system utilized Preoxygenation: Pre-oxygenation with 100% oxygen Intubation Type: IV induction Ventilation: Mask ventilation without difficulty LMA: LMA inserted LMA Size: 4.0

## 2015-07-11 NOTE — Anesthesia Postprocedure Evaluation (Signed)
Anesthesia Post Note  Patient: [name]  Procedure(s) Performed: Procedure(s) (LRB): CYSTOSCOPY WITH COLD CUP RESECTION AND FULGERATION (N/A)  Patient location during evaluation: PACU Anesthesia Type: General Level of consciousness: awake and alert Pain management: pain level controlled Vital Signs Assessment: post-procedure vital signs reviewed and stable Respiratory status: spontaneous breathing, nonlabored ventilation, respiratory function stable and patient connected to nasal cannula oxygen Cardiovascular status: blood pressure returned to baseline and stable Postop Assessment: no signs of nausea or vomiting Anesthetic complications: no    Last Vitals:  Filed Vitals:   07/11/15 0935 07/11/15 0945  BP:  136/62  Pulse: 48 52  Temp:    Resp: 10 16    Last Pain: There were no vitals filed for this visit.               Jadis Mika L

## 2015-07-11 NOTE — Interval H&P Note (Signed)
History and Physical Interval Note:  07/11/2015 8:26 AM  [name]  has presented today for surgery, with the diagnosis of BLADDER CANCER  The various methods of treatment have been discussed with the patient and family. After consideration of risks, benefits and other options for treatment, the patient has consented to  Procedure(s): [place] (N/A) as a surgical intervention .  The patient's history has been reviewed, patient examined, no change in status, stable for surgery.  I have reviewed the patient's chart and labs.  Questions were answered to the patient's satisfaction.     Kellee Sittner S

## 2015-07-11 NOTE — Transfer of Care (Signed)
Immediate Anesthesia Transfer of Care Note  Patient: [name]  Procedure(s) Performed: Procedure(s): CYSTOSCOPY WITH COLD CUP RESECTION AND FULGERATION (N/A)  Patient Location: PACU  Anesthesia Type:General  Level of Consciousness: awake, alert , oriented and patient cooperative  Airway & Oxygen Therapy: Patient Spontanous Breathing and Patient connected to nasal cannula oxygen  Post-op Assessment: Report given to RN and Post -op Vital signs reviewed and stable  Post vital signs: Reviewed and stable  Last Vitals:  Filed Vitals:   07/11/15 0709  BP: 155/73  Pulse: 60  Temp: 37.1 C  Resp: 14    Complications: No apparent anesthesia complications

## 2015-07-12 ENCOUNTER — Encounter (HOSPITAL_BASED_OUTPATIENT_CLINIC_OR_DEPARTMENT_OTHER): Payer: Self-pay | Admitting: Urology

## 2015-07-13 ENCOUNTER — Ambulatory Visit (INDEPENDENT_AMBULATORY_CARE_PROVIDER_SITE_OTHER): Payer: Medicare Other | Admitting: Internal Medicine

## 2015-07-13 ENCOUNTER — Other Ambulatory Visit (INDEPENDENT_AMBULATORY_CARE_PROVIDER_SITE_OTHER): Payer: Medicare Other

## 2015-07-13 ENCOUNTER — Encounter: Payer: Self-pay | Admitting: Internal Medicine

## 2015-07-13 VITALS — BP 140/76 | HR 57 | Temp 97.9°F | Ht 67.0 in | Wt 191.0 lb

## 2015-07-13 DIAGNOSIS — R7302 Impaired glucose tolerance (oral): Secondary | ICD-10-CM

## 2015-07-13 DIAGNOSIS — I10 Essential (primary) hypertension: Secondary | ICD-10-CM

## 2015-07-13 DIAGNOSIS — E876 Hypokalemia: Secondary | ICD-10-CM | POA: Diagnosis not present

## 2015-07-13 LAB — BASIC METABOLIC PANEL
BUN: 11 mg/dL (ref 6–23)
CO2: 35 mEq/L — ABNORMAL HIGH (ref 19–32)
CREATININE: 0.99 mg/dL (ref 0.40–1.50)
Calcium: 9.6 mg/dL (ref 8.4–10.5)
Chloride: 95 mEq/L — ABNORMAL LOW (ref 96–112)
GFR: 77.71 mL/min (ref >60.00)
Glucose, Bld: 104 mg/dL — ABNORMAL HIGH (ref 70–99)
POTASSIUM: 3.4 meq/L — AB (ref 3.5–5.1)
SODIUM: 136 meq/L (ref 135–145)

## 2015-07-13 MED ORDER — POTASSIUM CHLORIDE CRYS ER 10 MEQ PO TBCR: EXTENDED_RELEASE_TABLET | ORAL | Status: DC

## 2015-07-13 NOTE — Assessment & Plan Note (Signed)
stable overall by history and exam, recent data reviewed with pt, and pt to continue medical treatment as before,  to f/u any worsening symptoms or concerns BP Readings from Last 3 Encounters:  07/13/15 140/76  07/11/15 144/65  07/05/15 138/80

## 2015-07-13 NOTE — Assessment & Plan Note (Signed)
stable overall by history and exam, recent data reviewed with pt, and pt to continue medical treatment as before,  to f/u any worsening symptoms or concerns Lab Results  Component Value Date   HGBA1C 5.7 10/28/2012    

## 2015-07-13 NOTE — Progress Notes (Signed)
Pre visit review using our clinic review tool, if applicable. No additional management support is needed unless otherwise documented below in the visit note. 

## 2015-07-13 NOTE — Assessment & Plan Note (Signed)
Likely improved, no further diarrhea, still on HCT, will plan to fu bmp today, cont Kdur 10 tid for 1 wk (unless K improved more than expected), then 1 qd after that, with f/u at 2 wks,  to f/u any worsening symptoms or concerns

## 2015-07-13 NOTE — Progress Notes (Signed)
Subjective:    Patient ID: [name], male    DOB: 1936/02/28, 79 y.o.   MRN: [id number]  HPI  Here to f/u after episode weakness/dizziness leading to ED visit, finding of severe hypokalemia to 2.5, tx with kdur 10 tid since dec 28, already feeling much improved with better stamina and no worsening difficulty ambulating, and no new complaints, had diarrhea briefly recently, also on HCT.  Pt denies chest pain, increased sob or doe, wheezing, orthopnea, PND, increased LE swelling, palpitations, dizziness or syncope.  Pt denies new neurological symptoms such as new headache, or facial or extremity weakness or numbness   Pt denies polydipsia, polyuria,  Past Medical History  Diagnosis Date  . Diverticulosis of colon (without mention of hemorrhage)   . Bradycardia   . Bladder cancer ([place])   . Allergic rhinitis   . CAD (coronary artery disease)   . CHF (congestive heart failure) ([place])   . Rheumatoid arthritis(714.0)   . HTN (hypertension)   . HLD (hyperlipidemia)   . GERD (gastroesophageal reflux disease)   . Depression   . History of colon polyps   . Glaucoma   . Fatty liver disease, nonalcoholic   . DDD (degenerative disc disease), lumbosacral   . History of peptic ulcer   . History of hiatal hernia   . Right renal artery stenosis ([facility])     per cath 03-14-2009 --- 40-50%  . RLS (restless legs syndrome)   . History of bladder cancer     2004--  TCC low-grade , non-invasive  . History of kidney stones   . BPH (benign prostatic hyperplasia)   . Weak urinary stream   . Nocturia   . History of exercise stress test     02-05-2007-- ETT (Clinically positive, electrically equivocal, submaximal ETT,  appropriate bp response to exercise  . Essential tremor   . Chronic low back pain   . Wears glasses   . Wears dentures     upper  . Wears partial dentures     lower  . Wears hearing aid     bilateral  . Vertigo, intermittent   . Productive cough   . Acute bronchitis per pt no fever--   cough since discharge from hospital 07-02-2015    per pcp note 07-05-2015  mild to moderate bronchitis vs pna   Past Surgical History  Procedure Laterality Date  . Lumbar disc surgery  07-16-2005  . Lumbar laminectomy/decompression microdiscectomy  07/09/2011    Procedure: LUMBAR LAMINECTOMY/DECOMPRESSION MICRODISCECTOMY;  Surgeon: Johnn Hai;  Location: [facility];  Service: Orthopedics;  Laterality: Left;  Decompression L5 - S1 on the Left and repair of dura  . Lumbar laminectomy/decompression microdiscectomy N/A 07/27/2013    Procedure: DECOMPRESSION L4 - L5 WITH EXCISION OF SYNOVIAL CYST AND, LATERAL MASS FUSION 1 LEVEL;  Surgeon: Johnn Hai, MD;  Location: [facility];  Service: Orthopedics;  Laterality: N/A;  . Lumbar laminectomy/decompression microdiscectomy Left 07/06/2014    Procedure:  MICRO LUMBAR DECOMPRESSION L5-S1 ON LEFT, L4-5 REDO;  Surgeon: Johnn Hai, MD;  Location: [facility];  Service: Orthopedics;  Laterality: Left;  . Cardiac catheterization  02-12-2007  dr gregg taylor    Non-obstructive CAD,  20% pLCX,  20% LAD, ef 65%  . Cardiac catheterization  03-14-2009  dr Johnsie Cancel    pLAD and mid diaginal 20-30% multiple lesions,  OM1 20%, right renal ostial 40-50%,  ef 60%  . Transurethral resection of bladder tumor  09-21-2002  . Tonsillectomy  and adenoidectomy    . Hip arthroscopy w/ labral debridement Left 06-29-2000    and chondraplasty  . Transthoracic echocardiogram  09-20-2010    normal LVF, ef 55-65%/  mild MR, TR and PR/  mild LAE  . Cataract extraction w/ intraocular lens  implant, bilateral  2016  . Cystoscopy with biopsy N/A 07/11/2015    Procedure: CYSTOSCOPY WITH COLD CUP RESECTION AND FULGERATION;  Surgeon: Rana Snare, MD;  Location: [facility];  Service: Urology;  Laterality: N/A;    reports that he quit smoking about 32 years ago. His smoking use included Cigarettes. He has a 40 pack-year smoking history. He has never used smokeless tobacco.  He reports that he does not drink alcohol or use illicit drugs. family history includes Cancer in his brother, father, and sister; Diabetes in his sister; Heart attack in his mother. Allergies  Allergen Reactions  . Hydrochlorothiazide Other (See Comments)    REACTION: hyponatremia with daily use  . Lisinopril Cough  . Morphine And Related Other (See Comments)    Migraine   . Tape Itching    Patient  Is ok to use paper tape  . Sulfa Antibiotics Rash   Current Outpatient Prescriptions on File Prior to Visit  Medication Sig Dispense Refill  . cetirizine (ZYRTEC) 10 MG tablet Take 10 mg by mouth daily as needed for allergies.     . citalopram (CELEXA) 10 MG tablet Take 1 tablet (10 mg total) by mouth every morning. 90 tablet 3  . clotrimazole (LOTRIMIN) 1 % cream Apply 1 application topically 2 (two) times daily as needed. Prescribed for rash in groin from [place] on 06/20/2014    . clotrimazole-betamethasone (LOTRISONE) cream Use as directed twice per day as needed (Patient taking differently: Apply 1 application topically 2 (two) times daily. ) 15 g 1  . diphenoxylate-atropine (LOMOTIL) 2.5-0.025 MG tablet Take 1 tablet by mouth 4 (four) times daily as needed for diarrhea or loose stools. 40 tablet 0  . docusate sodium (COLACE) 100 MG capsule Take 1 capsule (100 mg total) by mouth 2 (two) times daily. 60 capsule 0  . fluticasone (FLONASE) 50 MCG/ACT nasal spray USE 2 SPRAYS IN EACH NOSTRIL EVERY DAY AS NEEDED  FOR  RHINITIS/ALLERGIES (Patient taking differently: Place 2 sprays into both nostrils every evening. And daily in pm and prn  USE 2 SPRAYS IN EACH NOSTRIL EVERY DAY AS NEEDED  FOR  RHINITIS/ALLERGIES) 48 g 3  . gabapentin (NEURONTIN) 300 MG capsule TAKE 1 TO 2 CAPSULES FOUR TIMES DAILY - AFTER MEALS AND AT BEDTIME. (Patient taking differently: Take 300-600 mg by mouth 4 (four) times daily. TAKE 1 TO 2 CAPSULES FOUR TIMES DAILY - AFTER MEALS AND AT BEDTIME.) 720 capsule 3  . hydrALAZINE  (APRESOLINE) 10 MG tablet Take 1 tablet (10 mg total) by mouth 2 (two) times daily. (Patient taking differently: Take 10 mg by mouth as directed. Takes bid only if systolic over [id number]  Diastolic over 80) 60 tablet 3  . HYDROcodone-homatropine (HYCODAN) 5-1.5 MG/5ML syrup Take 5 mLs by mouth every 6 (six) hours as needed for cough. 180 mL 0  . levofloxacin (LEVAQUIN) 250 MG tablet Take 250 mg by mouth daily.    . meclizine (ANTIVERT) 25 MG tablet Take 1 tablet (25 mg total) by mouth 3 (three) times daily as needed for dizziness. 90 tablet 3  . methylcellulose (ARTIFICIAL TEARS) 1 % ophthalmic solution Place 1 drop into both eyes 2 (two) times daily as needed (  dry eyes).     . nitroGLYCERIN (NITROSTAT) 0.4 MG SL tablet Place 1 tablet (0.4 mg total) under the tongue every 5 (five) minutes as needed. Chest pain 60 tablet 5  . omeprazole (PRILOSEC) 20 MG capsule Take 20 mg by mouth 2 (two) times daily before a meal.    . oxyCODONE-acetaminophen (PERCOCET/ROXICET) 5-325 MG tablet Take 1 tablet by mouth every 4 (four) hours as needed for severe pain.    . potassium chloride (K-DUR) 10 MEQ tablet Take 1 tablet (10 mEq total) by mouth 3 (three) times daily. 90 tablet 0  . primidone (MYSOLINE) 250 MG tablet TAKE 1/2 TABLET THREE TIMES DAILY (Patient taking differently: Take 125 mg by mouth 3 (three) times daily. TAKE 1/2 TABLET THREE TIMES DAILY) 135 tablet 3  . simvastatin (ZOCOR) 40 MG tablet TAKE 1 AND 1/2 TABLETS AT BEDTIME 135 tablet 3  . tamsulosin (FLOMAX) 0.4 MG CAPS capsule Take 1 capsule (0.4 mg total) by mouth daily. (Patient taking differently: Take 0.8 mg by mouth at bedtime. ) 90 capsule 3  . timolol (TIMOPTIC) 0.5 % ophthalmic solution Place 1 drop into the left eye 2 (two) times daily.  1  . Travoprost, BAK Free, (TRAVATAN Z) 0.004 % SOLN ophthalmic solution Place 1 drop into both eyes at bedtime. (Patient taking differently: Place 1 drop into both eyes 2 (two) times daily. ) 5 mL 5   No current  facility-administered medications on file prior to visit.   Review of Systems  Constitutional: Negative for unusual diaphoresis or night sweats HENT: Negative for ringing in ear or discharge Eyes: Negative for double vision or worsening visual disturbance.  Respiratory: Negative for choking and stridor.   Gastrointestinal: Negative for vomiting or other signifcant bowel change Genitourinary: Negative for hematuria or change in urine volume.  Musculoskeletal: Negative for other MSK pain or swelling Skin: Negative for color change and worsening wound.  Neurological: Negative for tremors and numbness other than noted  Psychiatric/Behavioral: Negative for decreased concentration or agitation other than above       Objective:   Physical Exam BP 140/76 mmHg  Pulse 57  Temp(Src) 97.9 F (36.6 C) (Oral)  Ht 5\' 7"  (1.702 m)  Wt 191 lb (86.637 kg)  BMI 29.91 kg/m2  SpO2 95% VS noted,  Constitutional: Pt appears in no significant distress HENT: Head: NCAT.  Right Ear: External ear normal.  Left Ear: External ear normal.  Eyes: . Pupils are equal, round, and reactive to light. Conjunctivae and EOM are normal Neck: Normal range of motion. Neck supple.  Cardiovascular: Normal rate and regular rhythm.   Pulmonary/Chest: Effort normal and breath sounds without rales or wheezing.  Abd:  Soft, NT, ND, + BS Neurological: Pt is alert. Not confused , motor grossly intact Skin: Skin is warm. No rash, no LE edema Psychiatric: Pt behavior is normal. No agitation.     Assessment & Plan:

## 2015-07-13 NOTE — Patient Instructions (Signed)
OK to continue the potassium at three times per day for the next week  Then reduce the pills to 1 per day (a new prescription will be sent)  Please continue all other medications as before, and refills have been done if requested.  Please have the pharmacy call with any other refills you may need.  Please keep your appointments with your specialists as you may have planned  Please go to the LAB in the Basement (turn left off the elevator) for the tests to be done today  Please return in 2 weeks for the same lab repeat testing  You will be contacted by phone if any changes need to be made immediately.  Otherwise, you will receive a letter about your results with an explanation, but please check with MyChart first.  Please remember to sign up for MyChart if you have not done so, as this will be important to you in the future with finding out test results, communicating by private email, and scheduling acute appointments online when needed.

## 2015-07-18 DIAGNOSIS — H02054 Trichiasis without entropian left upper eyelid: Secondary | ICD-10-CM | POA: Diagnosis not present

## 2015-07-20 ENCOUNTER — Telehealth: Payer: Self-pay | Admitting: Internal Medicine

## 2015-07-20 NOTE — Telephone Encounter (Signed)
Please advise on PA, thanks

## 2015-07-20 NOTE — Telephone Encounter (Signed)
Patient called to advise that [employer] informed him PA is needed for tamsulosin (FLOMAX) 0.4 MG CAPS capsule [id number] refill. No notes on file at this point that we have received. Please follow up

## 2015-07-24 ENCOUNTER — Telehealth: Payer: Self-pay

## 2015-07-24 NOTE — Telephone Encounter (Signed)
Sent in prior authorization through covermymeds. PA was not needed. Called patients insurance company to double check, spoke with Leory Plowman and he stated that patient does not need PA for this medication.

## 2015-07-25 ENCOUNTER — Other Ambulatory Visit (INDEPENDENT_AMBULATORY_CARE_PROVIDER_SITE_OTHER): Payer: Medicare Other

## 2015-07-25 DIAGNOSIS — E876 Hypokalemia: Secondary | ICD-10-CM | POA: Diagnosis not present

## 2015-07-25 LAB — BASIC METABOLIC PANEL
BUN: 7 mg/dL (ref 6–23)
CO2: 36 meq/L — AB (ref 19–32)
Calcium: 9.5 mg/dL (ref 8.4–10.5)
Chloride: 92 mEq/L — ABNORMAL LOW (ref 96–112)
Creatinine, Ser: 0.88 mg/dL (ref 0.40–1.50)
GFR: 89.01 mL/min (ref >60.00)
GLUCOSE: 105 mg/dL — AB (ref 70–99)
POTASSIUM: 3.7 meq/L (ref 3.5–5.1)
SODIUM: 133 meq/L — AB (ref 135–145)

## 2015-07-26 ENCOUNTER — Other Ambulatory Visit: Payer: Self-pay | Admitting: Internal Medicine

## 2015-07-27 ENCOUNTER — Telehealth: Payer: Self-pay

## 2015-07-27 MED ORDER — DIPHENOXYLATE-ATROPINE 2.5-0.025 MG PO TABS: 1.0000 | ORAL_TABLET | Freq: Four times a day (QID) | ORAL | Status: DC | PRN

## 2015-07-27 NOTE — Telephone Encounter (Signed)
Done Done hardcopy to Dahlia  

## 2015-07-27 NOTE — Telephone Encounter (Signed)
Rx given to triage - Corrine CMA

## 2015-07-27 NOTE — Telephone Encounter (Signed)
Patient ran out of medication (diphnoxylate athtropine) he would like a refill due to still having the loose stools. Please advise, is this ok to refill?

## 2015-07-27 NOTE — Addendum Note (Signed)
Addended by: Biagio Borg on: 07/27/2015 12:36 PM   Modules accepted: Orders

## 2015-09-10 ENCOUNTER — Ambulatory Visit (INDEPENDENT_AMBULATORY_CARE_PROVIDER_SITE_OTHER): Payer: Medicare Other | Admitting: Podiatry

## 2015-09-10 ENCOUNTER — Encounter: Payer: Self-pay | Admitting: Podiatry

## 2015-09-10 DIAGNOSIS — M79671 Pain in right foot: Secondary | ICD-10-CM | POA: Diagnosis not present

## 2015-09-10 DIAGNOSIS — B351 Tinea unguium: Secondary | ICD-10-CM | POA: Diagnosis not present

## 2015-09-10 DIAGNOSIS — M79676 Pain in unspecified toe(s): Secondary | ICD-10-CM | POA: Diagnosis not present

## 2015-09-11 NOTE — Progress Notes (Signed)
Subjective:     Patient ID: [name], male   DOB: May 29, 1936, 80 y.o.   MRN: [id number]  HPI patient presents with thick yellow brittle nails 1-5 both feet that are painful   Review of Systems     Objective:   Physical Exam Neurovascular status unchanged with thick yellow brittle nailbeds 1-5 both feet that are painful and impossible for him to cut    Assessment:     Mycotic nail infections with pain 1-5 both feet    Plan:     Debris painful nailbeds 1-5 both feet with no iatrogenic bleeding noted

## 2015-09-12 ENCOUNTER — Telehealth: Payer: Self-pay | Admitting: Internal Medicine

## 2015-09-12 ENCOUNTER — Other Ambulatory Visit (INDEPENDENT_AMBULATORY_CARE_PROVIDER_SITE_OTHER): Payer: Medicare Other

## 2015-09-12 ENCOUNTER — Telehealth: Payer: Self-pay

## 2015-09-12 DIAGNOSIS — I10 Essential (primary) hypertension: Secondary | ICD-10-CM

## 2015-09-12 LAB — BASIC METABOLIC PANEL WITH GFR
BUN: 8 mg/dL (ref 6–23)
CO2: 32 meq/L (ref 19–32)
Calcium: 9.3 mg/dL (ref 8.4–10.5)
Chloride: 93 meq/L — ABNORMAL LOW (ref 96–112)
Creatinine, Ser: 0.82 mg/dL (ref 0.40–1.50)
GFR: 96.54 mL/min (ref >60.00)
Glucose, Bld: 97 mg/dL (ref 70–99)
Potassium: 3.4 meq/L — ABNORMAL LOW (ref 3.5–5.1)
Sodium: 130 meq/L — ABNORMAL LOW (ref 135–145)

## 2015-09-12 NOTE — Telephone Encounter (Signed)
Patient states that Dr. Jenny Reichmann wanted him to go to the lab but he forgot when.  I looked under orders and did not see any.  Can you please follow up with Dr. Jenny Reichmann when he gets back in?  Informed the patient it will be next week before he gets a call back.

## 2015-09-12 NOTE — Telephone Encounter (Signed)
Labs ordered.

## 2015-09-16 ENCOUNTER — Encounter (HOSPITAL_COMMUNITY): Payer: Self-pay | Admitting: Emergency Medicine

## 2015-09-16 ENCOUNTER — Observation Stay (HOSPITAL_COMMUNITY)
Admission: EM | Admit: 2015-09-16 | Discharge: 2015-09-18 | Disposition: A | Discharge status: Home / Self Care | Payer: Medicare Other | Attending: Internal Medicine | Admitting: Internal Medicine

## 2015-09-16 ENCOUNTER — Emergency Department (HOSPITAL_COMMUNITY): Payer: Medicare Other

## 2015-09-16 DIAGNOSIS — Z8551 Personal history of malignant neoplasm of bladder: Secondary | ICD-10-CM | POA: Insufficient documentation

## 2015-09-16 DIAGNOSIS — R001 Bradycardia, unspecified: Secondary | ICD-10-CM | POA: Insufficient documentation

## 2015-09-16 DIAGNOSIS — I272 Other secondary pulmonary hypertension: Secondary | ICD-10-CM | POA: Diagnosis not present

## 2015-09-16 DIAGNOSIS — I2089 Other forms of angina pectoris: Secondary | ICD-10-CM | POA: Diagnosis present

## 2015-09-16 DIAGNOSIS — I10 Essential (primary) hypertension: Secondary | ICD-10-CM | POA: Diagnosis present

## 2015-09-16 DIAGNOSIS — Z79899 Other long term (current) drug therapy: Secondary | ICD-10-CM | POA: Diagnosis not present

## 2015-09-16 DIAGNOSIS — F329 Major depressive disorder, single episode, unspecified: Secondary | ICD-10-CM | POA: Insufficient documentation

## 2015-09-16 DIAGNOSIS — R0789 Other chest pain: Secondary | ICD-10-CM | POA: Diagnosis not present

## 2015-09-16 DIAGNOSIS — N400 Enlarged prostate without lower urinary tract symptoms: Secondary | ICD-10-CM | POA: Insufficient documentation

## 2015-09-16 DIAGNOSIS — I110 Hypertensive heart disease with heart failure: Secondary | ICD-10-CM | POA: Diagnosis not present

## 2015-09-16 DIAGNOSIS — R079 Chest pain, unspecified: Secondary | ICD-10-CM | POA: Diagnosis present

## 2015-09-16 DIAGNOSIS — G250 Essential tremor: Secondary | ICD-10-CM | POA: Insufficient documentation

## 2015-09-16 DIAGNOSIS — I2510 Atherosclerotic heart disease of native coronary artery without angina pectoris: Secondary | ICD-10-CM | POA: Diagnosis not present

## 2015-09-16 DIAGNOSIS — E876 Hypokalemia: Secondary | ICD-10-CM | POA: Diagnosis not present

## 2015-09-16 DIAGNOSIS — I509 Heart failure, unspecified: Secondary | ICD-10-CM | POA: Diagnosis not present

## 2015-09-16 DIAGNOSIS — K219 Gastro-esophageal reflux disease without esophagitis: Secondary | ICD-10-CM | POA: Diagnosis not present

## 2015-09-16 DIAGNOSIS — I208 Other forms of angina pectoris: Secondary | ICD-10-CM | POA: Diagnosis present

## 2015-09-16 DIAGNOSIS — R0602 Shortness of breath: Secondary | ICD-10-CM | POA: Insufficient documentation

## 2015-09-16 DIAGNOSIS — C679 Malignant neoplasm of bladder, unspecified: Secondary | ICD-10-CM | POA: Diagnosis present

## 2015-09-16 DIAGNOSIS — M069 Rheumatoid arthritis, unspecified: Secondary | ICD-10-CM | POA: Insufficient documentation

## 2015-09-16 DIAGNOSIS — F32A Depression, unspecified: Secondary | ICD-10-CM | POA: Diagnosis present

## 2015-09-16 DIAGNOSIS — Z87891 Personal history of nicotine dependence: Secondary | ICD-10-CM | POA: Insufficient documentation

## 2015-09-16 DIAGNOSIS — E785 Hyperlipidemia, unspecified: Secondary | ICD-10-CM | POA: Insufficient documentation

## 2015-09-16 LAB — CBC
HCT: 40.3 % (ref 39.0–52.0)
Hemoglobin: 14.4 g/dL (ref 13.0–17.0)
MCH: 31.4 pg (ref 26.0–34.0)
MCHC: 35.7 g/dL (ref 30.0–36.0)
MCV: 87.8 fL (ref 78.0–100.0)
PLATELETS: 194 10*3/uL (ref 150–400)
RBC: 4.59 MIL/uL (ref 4.22–5.81)
RDW: 12.8 % (ref 11.5–15.5)
WBC: 5.2 10*3/uL (ref 4.0–10.5)

## 2015-09-16 LAB — BASIC METABOLIC PANEL
Anion gap: 12 (ref 5–15)
BUN: 10 mg/dL (ref 6–20)
CALCIUM: 9.1 mg/dL (ref 8.9–10.3)
CHLORIDE: 93 mmol/L — AB (ref 101–111)
CO2: 26 mmol/L (ref 22–32)
CREATININE: 0.82 mg/dL (ref 0.61–1.24)
GFR calc non Af Amer: >60 mL/min (ref >60)
Glucose, Bld: 86 mg/dL (ref 65–99)
Potassium: 3.9 mmol/L (ref 3.5–5.1)
SODIUM: 131 mmol/L — AB (ref 135–145)

## 2015-09-16 LAB — I-STAT TROPONIN, ED: TROPONIN I, POC: 0.01 ng/mL (ref 0.00–0.08)

## 2015-09-16 MED ORDER — NITROGLYCERIN 0.4 MG SL SUBL
0.4000 mg | SUBLINGUAL_TABLET | SUBLINGUAL | Status: DC | PRN
  Administered 2015-09-17: 0.4 mg via SUBLINGUAL

## 2015-09-16 NOTE — ED Notes (Addendum)
Offered nitroglycerin for patient's pain, states that he will wait and see what the MD says and that he is "okay for now." MD made aware and wants patient to take anyway to help his pain. Pt updated and states he will take it.

## 2015-09-16 NOTE — ED Notes (Signed)
Patient's HR remains low 40s at this time. CXR modified to portable. MD notified.

## 2015-09-16 NOTE — ED Provider Notes (Signed)
CSN: [id number]     Arrival date & time 09/16/15  2220 History  By signing my name below, I, [name], attest that this documentation has been prepared under the direction and in the presence of Davonna Belling, MD. Electronically Signed: [name], ED Scribe. 09/16/2015. 11:38 PM.   Chief Complaint  Patient presents with  . Chest Pain   The history is provided by the patient. No language interpreter was used.  HPI Comments: DNIEL [name] is a 80 y.o. Male with a hx of bradycardia, bladder cancer, CAD, and CHF brought in by EMS who presents to the Emergency Department complaining of acute onset, constant, central sharp and pressured chest pain onset 5 hours ago. He reports that he now has associated soreness in the bilateral lower jaws and mild SOB. Pt states that this does not feel like pain associated with his heart disease. Pt was given 325 mg ASA and 1 nitro tablet en route by EMS to no relief. He did not take anything at home prior to being picked up by EMS. He says that his HR in the ED is slower than usual. He denies appetite change, fever, chills, nausea, vomiting, or diaphoresis. Pt's last stress test was performed in 2008.   Past Medical History  Diagnosis Date  . Diverticulosis of colon (without mention of hemorrhage)   . Bradycardia   . Bladder cancer ([place])   . Allergic rhinitis   . CAD (coronary artery disease)   . CHF (congestive heart failure) ([place])   . Rheumatoid arthritis(714.0)   . HTN (hypertension)   . HLD (hyperlipidemia)   . GERD (gastroesophageal reflux disease)   . Depression   . History of colon polyps   . Glaucoma   . Fatty liver disease, nonalcoholic   . DDD (degenerative disc disease), lumbosacral   . History of peptic ulcer   . History of hiatal hernia   . Right renal artery stenosis (H[facility])     per cath 03-14-2009 --- 40-50%  . RLS (restless legs syndrome)   . History of bladder cancer     2004--  TCC low-grade , non-invasive  . History of  kidney stones   . BPH (benign prostatic hyperplasia)   . Weak urinary stream   . Nocturia   . History of exercise stress test     02-05-2007-- ETT (Clinically positive, electrically equivocal, submaximal ETT,  appropriate bp response to exercise  . Essential tremor   . Chronic low back pain   . Wears glasses   . Wears dentures     upper  . Wears partial dentures     lower  . Wears hearing aid     bilateral  . Vertigo, intermittent   . Productive cough   . Acute bronchitis per pt no fever--  cough since discharge from hospital 07-02-2015    per pcp note 07-05-2015  mild to moderate bronchitis vs pna   Past Surgical History  Procedure Laterality Date  . Lumbar disc surgery  07-16-2005  . Lumbar laminectomy/decompression microdiscectomy  07/09/2011    Procedure: LUMBAR LAMINECTOMY/DECOMPRESSION MICRODISCECTOMY;  Surgeon: Johnn Hai;  Location: [facility];  Service: Orthopedics;  Laterality: Left;  Decompression L5 - S1 on the Left and repair of dura  . Lumbar laminectomy/decompression microdiscectomy N/A 07/27/2013    Procedure: DECOMPRESSION L4 - L5 WITH EXCISION OF SYNOVIAL CYST AND, LATERAL MASS FUSION 1 LEVEL;  Surgeon: Johnn Hai, MD;  Location: [facility];  Service: Orthopedics;  Laterality: N/A;  .  Lumbar laminectomy/decompression microdiscectomy Left 07/06/2014    Procedure:  MICRO LUMBAR DECOMPRESSION L5-S1 ON LEFT, L4-5 REDO;  Surgeon: Johnn Hai, MD;  Location: [facility];  Service: Orthopedics;  Laterality: Left;  . Cardiac catheterization  02-12-2007  dr gregg taylor    Non-obstructive CAD,  20% pLCX,  20% LAD, ef 65%  . Cardiac catheterization  03-14-2009  dr Johnsie Cancel    pLAD and mid diaginal 20-30% multiple lesions,  OM1 20%, right renal ostial 40-50%,  ef 60%  . Transurethral resection of bladder tumor  09-21-2002  . Tonsillectomy and adenoidectomy    . Hip arthroscopy w/ labral debridement Left 06-29-2000    and chondraplasty  . Transthoracic echocardiogram   09-20-2010    normal LVF, ef 55-65%/  mild MR, TR and PR/  mild LAE  . Cataract extraction w/ intraocular lens  implant, bilateral  2016  . Cystoscopy with biopsy N/A 07/11/2015    Procedure: CYSTOSCOPY WITH COLD CUP RESECTION AND FULGERATION;  Surgeon: Rana Snare, MD;  Location: [facility];  Service: Urology;  Laterality: N/A;   Family History  Problem Relation Age of Onset  . Heart attack Mother   . Cancer Father     lung  . Cancer Sister     lung  . Diabetes Sister   . Cancer Brother     lung that spread to brain   Social History  Substance Use Topics  . Smoking status: Former Smoker -- 2.00 packs/day for 20 years    Types: Cigarettes    Quit date: 10/08/1982  . Smokeless tobacco: Never Used  . Alcohol Use: No    Review of Systems  Constitutional: Negative for fever, chills and diaphoresis.  Respiratory: Positive for chest tightness and shortness of breath.   Cardiovascular: Positive for chest pain.  Gastrointestinal: Negative for nausea and vomiting.  All other systems reviewed and are negative.  Allergies  Morphine and related; Hydrochlorothiazide; Tape; Lisinopril; and Sulfa antibiotics  Home Medications   Prior to Admission medications   Medication Sig Start Date End Date Taking? Authorizing Provider  brimonidine (ALPHAGAN) 0.15 % ophthalmic solution Place 1 drop into both eyes 2 (two) times daily.   Yes Historical Provider, MD  cetirizine (ZYRTEC) 10 MG tablet Take 10 mg by mouth at bedtime.  10/28/12  Yes Biagio Borg, MD  citalopram (CELEXA) 10 MG tablet Take 1 tablet (10 mg total) by mouth every morning. 05/17/15  Yes Biagio Borg, MD  clotrimazole (LOTRIMIN) 1 % cream Apply 1 application topically 2 (two) times daily as needed (FOR RASH). Prescribed for rash in groin from [place] on 06/20/2014   Yes Historical Provider, MD  diphenoxylate-atropine (LOMOTIL) 2.5-0.025 MG tablet Take 1 tablet by mouth 4 (four) times daily as needed for diarrhea or loose  stools. 07/27/15  Yes Biagio Borg, MD  fluticasone (FLONASE) 50 MCG/ACT nasal spray USE 2 SPRAYS IN EACH NOSTRIL EVERY DAY AS NEEDED  FOR  RHINITIS/ALLERGIES Patient taking differently: Place 2 sprays into both nostrils every evening.  05/17/15  Yes Biagio Borg, MD  gabapentin (NEURONTIN) 300 MG capsule TAKE 1 TO 2 CAPSULES FOUR TIMES DAILY - AFTER MEALS AND AT BEDTIME. Patient taking differently: Take 1,500 mg by mouth at bedtime. TAKES 5 CAPS AT BEDTIME, BUT CAN TAKE UP TO 4 ADDT'L CAPS AS NEEDED FOR NEUROPATHY 05/17/15  Yes Biagio Borg, MD  hydrochlorothiazide (HYDRODIURIL) 25 MG tablet Take 25 mg by mouth daily as needed (FOR FLUID).  07/06/15  Yes Historical Provider, MD  latanoprost (XALATAN) 0.005 % ophthalmic solution Place 1 drop into both eyes at bedtime.   Yes Historical Provider, MD  nitroGLYCERIN (NITROSTAT) 0.4 MG SL tablet Place 1 tablet (0.4 mg total) under the tongue every 5 (five) minutes as needed. Chest pain 08/26/11  Yes Biagio Borg, MD  omeprazole (PRILOSEC) 20 MG capsule Take 20 mg by mouth 2 (two) times daily before a meal.   Yes Historical Provider, MD  oxyCODONE-acetaminophen (PERCOCET/ROXICET) 5-325 MG tablet Take 1 tablet by mouth every 4 (four) hours as needed for severe pain.   Yes Historical Provider, MD  potassium chloride (K-DUR) 10 MEQ tablet Take 1 tablet (10 mEq total) by mouth 3 (three) times daily. Patient taking differently: Take 10 mEq by mouth daily. SOMETIMES TAKES 1 ADDT'L TAB IN EVENING 07/11/15  Yes Rana Snare, MD  primidone (MYSOLINE) 250 MG tablet TAKE 1/2 TABLET THREE TIMES DAILY Patient taking differently: Take 125 mg by mouth 3 (three) times daily. TAKE 1/2 TABLET THREE TIMES DAILY 05/17/15  Yes Biagio Borg, MD  simvastatin (ZOCOR) 40 MG tablet TAKE 1 AND 1/2 TABLETS AT BEDTIME 05/17/15  Yes Biagio Borg, MD  tamsulosin (FLOMAX) 0.4 MG CAPS capsule Take 1 capsule (0.4 mg total) by mouth daily. Patient taking differently: Take 0.8 mg by mouth at  bedtime.  05/17/15  Yes Biagio Borg, MD  timolol (TIMOPTIC) 0.5 % ophthalmic solution Place 1 drop into both eyes 2 (two) times daily.  05/30/15  Yes Historical Provider, MD  Travoprost, BAK Free, (TRAVATAN Z) 0.004 % SOLN ophthalmic solution Place 1 drop into both eyes at bedtime. 09/14/13  Yes Biagio Borg, MD  amLODipine (NORVASC) 5 MG tablet Take 5 mg by mouth daily. Reported on 09/16/2015    Historical Provider, MD  clotrimazole-betamethasone (LOTRISONE) cream Use as directed twice per day as needed Patient taking differently: Apply 1 application topically 2 (two) times daily.  05/04/14   Biagio Borg, MD  docusate sodium (COLACE) 100 MG capsule Take 1 capsule (100 mg total) by mouth 2 (two) times daily. 07/27/13   Cecilie Kicks, PA-C  hydrALAZINE (APRESOLINE) 10 MG tablet Take 1 tablet (10 mg total) by mouth 2 (two) times daily. Patient taking differently: Take 10 mg by mouth as directed. Takes bid only if systolic over [id number]  Diastolic over 80 [id number]   Ripudeep K Rai, MD  HYDROcodone-homatropine [facility] 5-1.5 MG/5ML syrup Take 5 mLs by mouth every 6 (six) hours as needed for cough. 07/05/15   Biagio Borg, MD  losartan (COZAAR) 50 MG tablet Take 50 mg by mouth daily. Reported on 09/16/2015    Historical Provider, MD  meclizine (ANTIVERT) 25 MG tablet Take 1 tablet (25 mg total) by mouth 3 (three) times daily as needed for dizziness. 07/02/15   Ripudeep Krystal Eaton, MD  potassium chloride (K-DUR,KLOR-CON) 10 MEQ tablet Take 1 tab by mouth per day 07/13/15   Biagio Borg, MD   BP 124/82 mmHg  Pulse 43  Temp(Src) 97.8 F (36.6 C) (Oral)  Resp 10  SpO2 96% Physical Exam  Constitutional: He is oriented to person, place, and time. He appears well-developed and well-nourished.  HENT:  Head: Normocephalic and atraumatic.  Eyes: EOM are normal. Pupils are equal, round, and reactive to light.  Neck: Normal range of motion. Neck supple.  Cardiovascular: Normal rate, regular rhythm and normal heart  sounds.  Exam reveals no gallop and no friction rub.   No murmur  heard. Pulmonary/Chest: Effort normal. He has no wheezes. He has rales.  Rales to bilateral lower lobes  Abdominal: Soft. There is no tenderness.  Musculoskeletal: Normal range of motion. He exhibits no edema.  Neurological: He is alert and oriented to person, place, and time.  Skin: Skin is warm and dry. No rash noted.  Psychiatric: He has a normal mood and affect. His behavior is normal.  Nursing note and vitals reviewed.   ED Course  Procedures (including critical care time) DIAGNOSTIC STUDIES: Oxygen Saturation is 96% on RA, normal by my interpretation.    COORDINATION OF CARE: 11:12 PM-Discussed treatment plan which includes labs and EKG with pt at bedside and pt agreed to plan.    Labs Review Labs Reviewed  BASIC METABOLIC PANEL - Abnormal; Notable for the following:    Sodium 131 (*)    Chloride 93 (*)    All other components within normal limits  CBC  I-STAT TROPOININ, ED    Imaging Review Dg Chest Portable 1 View  09/16/2015  CLINICAL DATA:  Bradycardia tonight. EXAM: PORTABLE CHEST 1 VIEW COMPARISON:  06/30/2014 FINDINGS: Shallow inspiration with atelectasis in the lung bases. Normal heart size and pulmonary vascularity. No focal airspace disease or consolidation in the lungs. No blunting of costophrenic angles. No pneumothorax. Mediastinal contours appear intact. Calcification of the aorta. IMPRESSION: Shallow inspiration with atelectasis in the lung bases. Electronically Signed   By: Lucienne Capers M.D.   On: 09/16/2015 23:33   I have personally reviewed and evaluated these images and lab results as part of my medical decision-making.   EKG Interpretation   Date/Time:  Sunday September 16 2015 23:54:17 EST Ventricular Rate:  44 PR Interval:  171 QRS Duration: 104 QT Interval:  448 QTC Calculation: 383 R Axis:   35 Text Interpretation:  Sinus bradycardia Minimal ST depression, diffuse  leads No  significant change since last tracing Since last tracing of  earlier today Confirmed by Alvino Chapel  MD, Isabelle Matt [phone]) on 09/16/2015  11:59:53 PM      EKG Interpretation  Date/Time:  Sunday September 16 2015 23:54:17 EST Ventricular Rate:  44 PR Interval:  171 QRS Duration: 104 QT Interval:  448 QTC Calculation: 383 R Axis:   35 Text Interpretation:  Sinus bradycardia Minimal ST depression, diffuse leads No significant change since last tracing Since last tracing of earlier today Confirmed by Kree Rafter  MD, Ovid Curd [phone]) on 09/16/2015 11:59:53 PM        MDM   Final diagnoses:  Chest pain, unspecified chest pain type  Bradycardia    Patient with chest pain. Has had a fever last few hours. Mid chest and dull. States does not feel like his previous anginal pain. Does have radiation to the neck however. EKG does show bradycardia that is poorly somewhat slower than home normally go. Nonspecific EKG changes. No pain relief with nitroglycerin. Negative enzymes. Will admit to internal medicine. Has seen Dr. Lovena Le from cardiology in the past.   I personally performed the services described in this documentation, which was scribed in my presence. The recorded information has been reviewed and is accurate.      Davonna Belling, MD 09/17/15 [phone]

## 2015-09-16 NOTE — ED Notes (Signed)
Pt states she was sitting at home at 2000 and had a sudden onset of central chest pressure that was radting into left arm. Ems gave 325 mg ASA and 1 nitro tablet. Pt states pain is same. Pt also states he now has a soreness in right and left jaw.

## 2015-09-17 ENCOUNTER — Encounter (HOSPITAL_COMMUNITY): Payer: Self-pay | Admitting: General Practice

## 2015-09-17 DIAGNOSIS — F329 Major depressive disorder, single episode, unspecified: Secondary | ICD-10-CM | POA: Diagnosis present

## 2015-09-17 DIAGNOSIS — I208 Other forms of angina pectoris: Secondary | ICD-10-CM | POA: Diagnosis not present

## 2015-09-17 DIAGNOSIS — I110 Hypertensive heart disease with heart failure: Secondary | ICD-10-CM | POA: Diagnosis not present

## 2015-09-17 DIAGNOSIS — R079 Chest pain, unspecified: Secondary | ICD-10-CM | POA: Diagnosis present

## 2015-09-17 DIAGNOSIS — G250 Essential tremor: Secondary | ICD-10-CM | POA: Diagnosis present

## 2015-09-17 DIAGNOSIS — I272 Other secondary pulmonary hypertension: Secondary | ICD-10-CM

## 2015-09-17 DIAGNOSIS — I2089 Other forms of angina pectoris: Secondary | ICD-10-CM | POA: Diagnosis present

## 2015-09-17 DIAGNOSIS — R0602 Shortness of breath: Secondary | ICD-10-CM | POA: Diagnosis not present

## 2015-09-17 DIAGNOSIS — I10 Essential (primary) hypertension: Secondary | ICD-10-CM

## 2015-09-17 DIAGNOSIS — R001 Bradycardia, unspecified: Secondary | ICD-10-CM | POA: Diagnosis not present

## 2015-09-17 DIAGNOSIS — C679 Malignant neoplasm of bladder, unspecified: Secondary | ICD-10-CM | POA: Diagnosis not present

## 2015-09-17 DIAGNOSIS — F32A Depression, unspecified: Secondary | ICD-10-CM | POA: Diagnosis present

## 2015-09-17 DIAGNOSIS — I509 Heart failure, unspecified: Secondary | ICD-10-CM

## 2015-09-17 DIAGNOSIS — R0789 Other chest pain: Secondary | ICD-10-CM | POA: Diagnosis not present

## 2015-09-17 DIAGNOSIS — I2510 Atherosclerotic heart disease of native coronary artery without angina pectoris: Secondary | ICD-10-CM

## 2015-09-17 LAB — D-DIMER, QUANTITATIVE: D-Dimer, Quant: <0.27 ug/mL-FEU (ref 0.00–0.50)

## 2015-09-17 LAB — HEPARIN LEVEL (UNFRACTIONATED)
HEPARIN UNFRACTIONATED: 0.32 [IU]/mL (ref 0.30–0.70)
HEPARIN UNFRACTIONATED: 0.3 [IU]/mL (ref 0.30–0.70)

## 2015-09-17 LAB — COMPREHENSIVE METABOLIC PANEL
ALT: 16 U/L — AB (ref 17–63)
ANION GAP: 10 (ref 5–15)
AST: 23 U/L (ref 15–41)
Albumin: 3.4 g/dL — ABNORMAL LOW (ref 3.5–5.0)
Alkaline Phosphatase: 52 U/L (ref 38–126)
BUN: 7 mg/dL (ref 6–20)
CHLORIDE: 94 mmol/L — AB (ref 101–111)
CO2: 27 mmol/L (ref 22–32)
CREATININE: 0.85 mg/dL (ref 0.61–1.24)
Calcium: 8.8 mg/dL — ABNORMAL LOW (ref 8.9–10.3)
GFR calc non Af Amer: >60 mL/min (ref >60)
Glucose, Bld: 112 mg/dL — ABNORMAL HIGH (ref 65–99)
Potassium: 2.9 mmol/L — ABNORMAL LOW (ref 3.5–5.1)
SODIUM: 131 mmol/L — AB (ref 135–145)
Total Bilirubin: 0.5 mg/dL (ref 0.3–1.2)
Total Protein: 5.7 g/dL — ABNORMAL LOW (ref 6.5–8.1)

## 2015-09-17 LAB — TROPONIN I: Troponin I: <0.03 ng/mL (ref <0.031)

## 2015-09-17 LAB — LIPID PANEL
Cholesterol: 140 mg/dL (ref 0–200)
HDL: 31 mg/dL — ABNORMAL LOW (ref >40)
LDL Cholesterol: 77 mg/dL (ref 0–99)
Total CHOL/HDL Ratio: 4.5 RATIO
Triglycerides: 160 mg/dL — ABNORMAL HIGH (ref <150)
VLDL: 32 mg/dL (ref 0–40)

## 2015-09-17 LAB — BRAIN NATRIURETIC PEPTIDE: B NATRIURETIC PEPTIDE 5: 88.4 pg/mL (ref 0.0–100.0)

## 2015-09-17 LAB — MAGNESIUM: Magnesium: 1.7 mg/dL (ref 1.7–2.4)

## 2015-09-17 LAB — TSH: TSH: 2.04 u[IU]/mL (ref 0.350–4.500)

## 2015-09-17 MED ORDER — SIMVASTATIN 40 MG PO TABS: 40.0000 mg | ORAL_TABLET | Freq: Every day | ORAL | Status: DC

## 2015-09-17 MED ORDER — ATORVASTATIN CALCIUM 80 MG PO TABS
80.0000 mg | ORAL_TABLET | Freq: Every day | ORAL | Status: DC
  Administered 2015-09-17 – 2015-09-18 (×2): 80 mg via ORAL
  Filled 2015-09-17 (×3): qty 1

## 2015-09-17 MED ORDER — SODIUM CHLORIDE 0.9 % IV SOLN
INTRAVENOUS | Status: DC
  Administered 2015-09-17 – 2015-09-18 (×3): via INTRAVENOUS

## 2015-09-17 MED ORDER — HEPARIN (PORCINE) IN NACL 100-0.45 UNIT/ML-% IJ SOLN
1200.0000 [IU]/h | INTRAMUSCULAR | Status: DC
  Administered 2015-09-17: 1000 [IU]/h via INTRAVENOUS
  Administered 2015-09-17: 1050 [IU]/h via INTRAVENOUS
  Filled 2015-09-17 (×2): qty 250

## 2015-09-17 MED ORDER — GI COCKTAIL ~~LOC~~
30.0000 mL | Freq: Once | ORAL | Status: AC
  Administered 2015-09-17: 30 mL via ORAL
  Filled 2015-09-17: qty 30

## 2015-09-17 MED ORDER — MAGNESIUM SULFATE 2 GM/50ML IV SOLN
2.0000 g | Freq: Once | INTRAVENOUS | Status: AC
  Administered 2015-09-17: 2 g via INTRAVENOUS
  Filled 2015-09-17: qty 50

## 2015-09-17 MED ORDER — ALUM & MAG HYDROXIDE-SIMETH 200-200-20 MG/5ML PO SUSP
30.0000 mL | ORAL | Status: DC | PRN
  Administered 2015-09-17: 30 mL via ORAL
  Filled 2015-09-17: qty 30

## 2015-09-17 MED ORDER — POTASSIUM CHLORIDE CRYS ER 20 MEQ PO TBCR
40.0000 meq | EXTENDED_RELEASE_TABLET | ORAL | Status: AC
  Administered 2015-09-17 (×2): 40 meq via ORAL
  Filled 2015-09-17 (×2): qty 2

## 2015-09-17 MED ORDER — HEPARIN BOLUS VIA INFUSION
4000.0000 [IU] | Freq: Once | INTRAVENOUS | Status: AC
  Administered 2015-09-17: 4000 [IU] via INTRAVENOUS
  Filled 2015-09-17: qty 4000

## 2015-09-17 MED ORDER — TAMSULOSIN HCL 0.4 MG PO CAPS
0.8000 mg | ORAL_CAPSULE | Freq: Every day | ORAL | Status: DC
  Administered 2015-09-17: 0.8 mg via ORAL
  Filled 2015-09-17: qty 2

## 2015-09-17 MED ORDER — AMLODIPINE BESYLATE 5 MG PO TABS
5.0000 mg | ORAL_TABLET | Freq: Every day | ORAL | Status: DC
  Administered 2015-09-17: 5 mg via ORAL
  Filled 2015-09-17: qty 1

## 2015-09-17 MED ORDER — NITROGLYCERIN 2 % TD OINT
0.5000 [in_us] | TOPICAL_OINTMENT | Freq: Four times a day (QID) | TRANSDERMAL | Status: DC
  Administered 2015-09-17 – 2015-09-18 (×4): 0.5 [in_us] via TOPICAL
  Filled 2015-09-17: qty 0.5

## 2015-09-17 MED ORDER — LATANOPROST 0.005 % OP SOLN: 1.0000 [drp] | Freq: Every day | OPHTHALMIC | Status: DC

## 2015-09-17 MED ORDER — CITALOPRAM HYDROBROMIDE 10 MG PO TABS
10.0000 mg | ORAL_TABLET | Freq: Every morning | ORAL | Status: DC
  Administered 2015-09-17 – 2015-09-18 (×2): 10 mg via ORAL
  Filled 2015-09-17 (×2): qty 1

## 2015-09-17 MED ORDER — ASPIRIN 300 MG RE SUPP: 300.0000 mg | RECTAL | Status: DC

## 2015-09-17 MED ORDER — PANTOPRAZOLE SODIUM 40 MG PO TBEC
40.0000 mg | DELAYED_RELEASE_TABLET | Freq: Two times a day (BID) | ORAL | Status: DC
  Administered 2015-09-17 – 2015-09-18 (×2): 40 mg via ORAL
  Filled 2015-09-17 (×2): qty 1

## 2015-09-17 MED ORDER — NITROGLYCERIN 0.4 MG SL SUBL
0.4000 mg | SUBLINGUAL_TABLET | SUBLINGUAL | Status: DC | PRN
  Administered 2015-09-17 (×2): 0.4 mg via SUBLINGUAL
  Filled 2015-09-17: qty 1

## 2015-09-17 MED ORDER — SUCRALFATE 1 GM/10ML PO SUSP
1.0000 g | Freq: Three times a day (TID) | ORAL | Status: DC
  Administered 2015-09-17 – 2015-09-18 (×3): 1 g via ORAL
  Filled 2015-09-17 (×3): qty 10

## 2015-09-17 MED ORDER — CLOTRIMAZOLE 10 MG MT TROC
10.0000 mg | Freq: Every day | OROMUCOSAL | Status: DC
  Administered 2015-09-17 – 2015-09-18 (×5): 10 mg via ORAL
  Filled 2015-09-17 (×8): qty 1

## 2015-09-17 MED ORDER — GABAPENTIN 400 MG PO CAPS
1500.0000 mg | ORAL_CAPSULE | Freq: Every day | ORAL | Status: DC
  Administered 2015-09-17: 1500 mg via ORAL
  Filled 2015-09-17: qty 3

## 2015-09-17 MED ORDER — LOSARTAN POTASSIUM 50 MG PO TABS
50.0000 mg | ORAL_TABLET | Freq: Every day | ORAL | Status: DC
  Administered 2015-09-17: 50 mg via ORAL
  Filled 2015-09-17: qty 1

## 2015-09-17 MED ORDER — MAGNESIUM OXIDE 400 (241.3 MG) MG PO TABS
200.0000 mg | ORAL_TABLET | Freq: Two times a day (BID) | ORAL | Status: DC
  Administered 2015-09-17 – 2015-09-18 (×3): 200 mg via ORAL
  Filled 2015-09-17 (×3): qty 1

## 2015-09-17 MED ORDER — FLUTICASONE PROPIONATE 50 MCG/ACT NA SUSP
2.0000 | Freq: Every evening | NASAL | Status: DC
  Administered 2015-09-17: 2 via NASAL
  Filled 2015-09-17: qty 16

## 2015-09-17 MED ORDER — ONDANSETRON HCL 4 MG/2ML IJ SOLN: 4.0000 mg | Freq: Four times a day (QID) | INTRAMUSCULAR | Status: DC | PRN

## 2015-09-17 MED ORDER — CLOTRIMAZOLE 1 % EX CREA
1.0000 "application " | TOPICAL_CREAM | Freq: Two times a day (BID) | CUTANEOUS | Status: DC
  Administered 2015-09-17 – 2015-09-18 (×3): 1 via TOPICAL
  Filled 2015-09-17: qty 15

## 2015-09-17 MED ORDER — ACETAMINOPHEN 325 MG PO TABS: 650.0000 mg | ORAL_TABLET | ORAL | Status: DC | PRN

## 2015-09-17 MED ORDER — BRIMONIDINE TARTRATE 0.2 % OP SOLN
1.0000 [drp] | Freq: Two times a day (BID) | OPHTHALMIC | Status: DC
  Administered 2015-09-17 – 2015-09-18 (×3): 1 [drp] via OPHTHALMIC
  Filled 2015-09-17: qty 5

## 2015-09-17 MED ORDER — PANTOPRAZOLE SODIUM 40 MG PO TBEC
40.0000 mg | DELAYED_RELEASE_TABLET | Freq: Every day | ORAL | Status: DC
  Administered 2015-09-17: 40 mg via ORAL
  Filled 2015-09-17: qty 1

## 2015-09-17 MED ORDER — ASPIRIN EC 81 MG PO TBEC
81.0000 mg | DELAYED_RELEASE_TABLET | Freq: Every day | ORAL | Status: DC
  Administered 2015-09-17 – 2015-09-18 (×2): 81 mg via ORAL
  Filled 2015-09-17 (×2): qty 1

## 2015-09-17 MED ORDER — PRIMIDONE 250 MG PO TABS
125.0000 mg | ORAL_TABLET | Freq: Three times a day (TID) | ORAL | Status: DC
  Administered 2015-09-17 – 2015-09-18 (×5): 125 mg via ORAL
  Filled 2015-09-17 (×6): qty 1

## 2015-09-17 MED ORDER — NITROGLYCERIN 0.4 MG SL SUBL: 0.4000 mg | SUBLINGUAL_TABLET | SUBLINGUAL | Status: DC | PRN

## 2015-09-17 MED ORDER — LATANOPROST 0.005 % OP SOLN
1.0000 [drp] | Freq: Every day | OPHTHALMIC | Status: DC
  Administered 2015-09-17: 1 [drp] via OPHTHALMIC
  Filled 2015-09-17: qty 2.5

## 2015-09-17 MED ORDER — ASPIRIN 81 MG PO CHEW: 324.0000 mg | CHEWABLE_TABLET | ORAL | Status: DC

## 2015-09-17 NOTE — Progress Notes (Signed)
ANTICOAGULATION CONSULT NOTE - Initial Consult  Pharmacy Consult for Heparin Indication: chest pain/ACS  Allergies  Allergen Reactions  . Morphine And Related Other (See Comments)    Migraine   . Hydrochlorothiazide Other (See Comments)    REACTION: hyponatremia with daily use  . Tape Itching    Patient  Is ok to use paper tape  . Lisinopril Cough  . Sulfa Antibiotics Rash    Patient Measurements: Height: 5\' 8"  (172.7 cm) Weight: 191 lb 14.4 oz (87.045 kg) (Scale A) IBW/kg (Calculated) : 68.4  Vital Signs: Temp: 97.6 F (36.4 C) (03/06 0509) Temp Source: Oral (03/06 0509) BP: 115/57 mmHg (03/06 0509) Pulse Rate: 43 (03/06 0509)  Labs:  Recent Labs  09/16/15 2247 09/17/15 0445 09/17/15 0933  HGB 14.4  --   --   HCT 40.3  --   --   PLT 194  --   --   HEPARINUNFRC  --   --  0.32  CREATININE 0.82 0.85  --   TROPONINI  --  <0.03 <0.03    Estimated Creatinine Clearance: 76.8 mL/min (by C-G formula based on Cr of 0.85).  Assessment: 80 y.o. male with chest pain, on IV heparin, troponin neg x 2, continue heparin and checking D-dimer for PE r/o. Heparin level 0.32, therapeutic, but drawn too early (~ 4.5 hrs after infusion was started).   Goal of Therapy:  Heparin level 0.3-0.7 units/ml Monitor platelets by anticoagulation protocol: Yes   Plan:  Continue heparin 1000 units/hr Check heparin level in 8 hours.  Monitor daily heparin level, CBC F/u PE r/o   [name], PharmD, BCPS  Clinical Pharmacist  Pager: [phone]   09/17/2015,2:02 PM

## 2015-09-17 NOTE — Progress Notes (Signed)
   09/17/15 0258  Vitals  Temp 97.4 F (36.3 C)  Temp Source Oral  BP (!) 132/56 mmHg  BP Location Right Arm  BP Method Automatic  Patient Position (if appropriate) Sitting  Pulse Rate (!) 45  Pulse Rate Source Monitor  Resp 16  Oxygen Therapy  SpO2 98 %  O2 Device Room Air  Height and Weight  Height 5\' 8"  (1.727 m)  Weight 87.045 kg (191 lb 14.4 oz) (Scale A)  Type of Scale Used Standing  Type of Weight Actual  BSA (Calculated - sq m) 2.04 sq meters  BMI (Calculated) 29.2  Weight in (lb) to have BMI = 25 164.1  Admitted pt to rm 3E06 from ED, pt alert and oriented, denied pain at this time, oriented to room, call bell placed within reach, placed on cardiac monitor, CCMD made aware.

## 2015-09-17 NOTE — Progress Notes (Signed)
Pt's HR down to 37 nonsustained, pt asleep resting in bed. Dr. Sherral Hammers made aware.

## 2015-09-17 NOTE — Care Management Obs Status (Signed)
[place] NOTIFICATION   Patient Details  Name: MARVION MICHALAK MRN: [id number] Date of Birth: 08/17/1935   Medicare Observation Status Notification Given:  Yes    Royston Bake, RN 09/17/2015, 3:57 PM

## 2015-09-17 NOTE — Consult Note (Signed)
CARDIOLOGY CONSULT NOTE   Patient ID: ANASTASIO BONSELL MRN: [id number] DOB/AGE: [birth year] 80 y.o.  Admit date: 09/16/2015  Primary Physician   Cathlean Cower, MD Primary Cardiologist  New (seen by Dr. Lovena Le 2013)  Reason for Consultation   Chest pain Requesting Physician  Dr. Sheran Fava  HPI: [name] is a 80 y.o. male with a history of HTN, HL, CAD, Bradycardia, bladder cancer who came to ED for evaluation of chest pain.   Yesterday 3/5 patient had a substernal chest pressure. The pressure was radiated to L forearm, however he has chronic L shoulder pain. No associated SOB, diaphoresis or nausea. The pain constantly lasted for 5 hours and then EMS was called. No improvement after SL nitro x 1. Also given asa 324mg . He pain somewhat improved in ED. He denies any recent exertional chest pain or sob. The patient denies nausea, vomiting, fever,palpitations, shortness of breath, orthopnea, PND, dizziness, syncope, cough, congestion, abdominal pain, hematochezia, melena, lower extremity edema.   EKG showed sinus rhythm at rate of 46 bpm. No acute EKG change. BNP 88. Troponin negative. LDL 77. K of 2.9. The patient had a Cold cup resection of bladder tumors with fulguration 06/2015. Since then he is intermittently hypokalemic.   Cath 2008 showed non obstructive CAD. Last cath 03/2009 again showed non obstructive CAD (multiple 20-30% lesions in LAD, first diagonal, 1st & 2nd obtuse branch), 40-50% ostial lesion in the right renal artery.     Past Medical History  Diagnosis Date  . Diverticulosis of colon (without mention of hemorrhage)   . Bradycardia   . Bladder cancer ([place])   . Allergic rhinitis   . CAD (coronary artery disease)   . CHF (congestive heart failure) ([place])   . Rheumatoid arthritis(714.0)   . HTN (hypertension)   . HLD (hyperlipidemia)   . GERD (gastroesophageal reflux disease)   . Depression   . History of colon polyps   . Glaucoma   . Fatty liver disease, nonalcoholic   .  DDD (degenerative disc disease), lumbosacral   . History of peptic ulcer   . History of hiatal hernia   . Right renal artery stenosis (HCC)     per cath 03-14-2009 --- 40-50%  . RLS (restless legs syndrome)   . History of bladder cancer     2004--  TCC low-grade , non-invasive  . History of kidney stones   . BPH (benign prostatic hyperplasia)   . Weak urinary stream   . Nocturia   . History of exercise stress test     02-05-2007-- ETT (Clinically positive, electrically equivocal, submaximal ETT,  appropriate bp response to exercise  . Essential tremor   . Chronic low back pain   . Wears glasses   . Wears dentures     upper  . Wears partial dentures     lower  . Wears hearing aid     bilateral  . Vertigo, intermittent   . Productive cough   . Acute bronchitis per pt no fever--  cough since discharge from hospital 07-02-2015    per pcp note 07-05-2015  mild to moderate bronchitis vs pna     Past Surgical History  Procedure Laterality Date  . Lumbar disc surgery  07-16-2005  . Lumbar laminectomy/decompression microdiscectomy  07/09/2011    Procedure: LUMBAR LAMINECTOMY/DECOMPRESSION MICRODISCECTOMY;  Surgeon: Johnn Hai;  Location: [facility];  Service: Orthopedics;  Laterality: Left;  Decompression L5 - S1 on the Left and repair of dura  .  Lumbar laminectomy/decompression microdiscectomy N/A 07/27/2013    Procedure: DECOMPRESSION L4 - L5 WITH EXCISION OF SYNOVIAL CYST AND, LATERAL MASS FUSION 1 LEVEL;  Surgeon: Johnn Hai, MD;  Location: [facility];  Service: Orthopedics;  Laterality: N/A;  . Lumbar laminectomy/decompression microdiscectomy Left 07/06/2014    Procedure:  MICRO LUMBAR DECOMPRESSION L5-S1 ON LEFT, L4-5 REDO;  Surgeon: Johnn Hai, MD;  Location: [facility];  Service: Orthopedics;  Laterality: Left;  . Cardiac catheterization  02-12-2007  dr gregg taylor    Non-obstructive CAD,  20% pLCX,  20% LAD, ef 65%  . Cardiac catheterization  03-14-2009  dr Johnsie Cancel    pLAD  and mid diaginal 20-30% multiple lesions,  OM1 20%, right renal ostial 40-50%,  ef 60%  . Transurethral resection of bladder tumor  09-21-2002  . Tonsillectomy and adenoidectomy    . Hip arthroscopy w/ labral debridement Left 06-29-2000    and chondraplasty  . Transthoracic echocardiogram  09-20-2010    normal LVF, ef 55-65%/  mild MR, TR and PR/  mild LAE  . Cataract extraction w/ intraocular lens  implant, bilateral  2016  . Cystoscopy with biopsy N/A 07/11/2015    Procedure: CYSTOSCOPY WITH COLD CUP RESECTION AND FULGERATION;  Surgeon: Rana Snare, MD;  Location: [facility];  Service: Urology;  Laterality: N/A;    Allergies  Allergen Reactions  . Morphine And Related Other (See Comments)    Migraine   . Hydrochlorothiazide Other (See Comments)    REACTION: hyponatremia with daily use  . Tape Itching    Patient  Is ok to use paper tape  . Lisinopril Cough  . Sulfa Antibiotics Rash    I have reviewed the patient's current medications . amLODipine  5 mg Oral Daily  . aspirin EC  81 mg Oral Daily  . atorvastatin  80 mg Oral q1800  . brimonidine  1 drop Both Eyes BID  . citalopram  10 mg Oral q morning - 10a  . clotrimazole  1 application Topical BID  . fluticasone  2 spray Each Nare QPM  . gabapentin  1,500 mg Oral QHS  . gi cocktail  30 mL Oral Once  . latanoprost  1 drop Both Eyes QHS  . losartan  50 mg Oral Daily  . magnesium oxide  200 mg Oral BID  . pantoprazole  40 mg Oral Daily  . potassium chloride  40 mEq Oral Q4H  . primidone  125 mg Oral TID  . tamsulosin  0.8 mg Oral QHS   . sodium chloride 75 mL/hr at 09/17/15 0508  . heparin 1,000 Units/hr (09/17/15 0507)   acetaminophen, nitroGLYCERIN, ondansetron (ZOFRAN) IV  Prior to Admission medications   Medication Sig Start Date End Date Taking? Authorizing Provider  brimonidine (ALPHAGAN) 0.15 % ophthalmic solution Place 1 drop into both eyes 2 (two) times daily.   Yes Historical Provider, MD    cetirizine (ZYRTEC) 10 MG tablet Take 10 mg by mouth at bedtime.  10/28/12  Yes Biagio Borg, MD  citalopram (CELEXA) 10 MG tablet Take 1 tablet (10 mg total) by mouth every morning. 05/17/15  Yes Biagio Borg, MD  clotrimazole (LOTRIMIN) 1 % cream Apply 1 application topically 2 (two) times daily as needed (FOR RASH). Prescribed for rash in groin from [place] on 06/20/2014   Yes Historical Provider, MD  diphenoxylate-atropine (LOMOTIL) 2.5-0.025 MG tablet Take 1 tablet by mouth 4 (four) times daily as needed for diarrhea or loose stools. 07/27/15  Yes  Biagio Borg, MD  fluticasone (FLONASE) 50 MCG/ACT nasal spray USE 2 SPRAYS IN EACH NOSTRIL EVERY DAY AS NEEDED  FOR  RHINITIS/ALLERGIES Patient taking differently: Place 2 sprays into both nostrils every evening.  05/17/15  Yes Biagio Borg, MD  gabapentin (NEURONTIN) 300 MG capsule TAKE 1 TO 2 CAPSULES FOUR TIMES DAILY - AFTER MEALS AND AT BEDTIME. Patient taking differently: Take 1,500 mg by mouth at bedtime. TAKES 5 CAPS AT BEDTIME, BUT CAN TAKE UP TO 4 ADDT'L CAPS AS NEEDED FOR NEUROPATHY 05/17/15  Yes Biagio Borg, MD  hydrochlorothiazide (HYDRODIURIL) 25 MG tablet Take 25 mg by mouth daily as needed (FOR FLUID).  07/06/15  Yes Historical Provider, MD  latanoprost (XALATAN) 0.005 % ophthalmic solution Place 1 drop into both eyes at bedtime.   Yes Historical Provider, MD  nitroGLYCERIN (NITROSTAT) 0.4 MG SL tablet Place 1 tablet (0.4 mg total) under the tongue every 5 (five) minutes as needed. Chest pain 08/26/11  Yes Biagio Borg, MD  omeprazole (PRILOSEC) 20 MG capsule Take 20 mg by mouth 2 (two) times daily before a meal.   Yes Historical Provider, MD  oxyCODONE-acetaminophen (PERCOCET/ROXICET) 5-325 MG tablet Take 1 tablet by mouth every 4 (four) hours as needed for severe pain.   Yes Historical Provider, MD  potassium chloride (K-DUR) 10 MEQ tablet Take 1 tablet (10 mEq total) by mouth 3 (three) times daily. Patient taking differently: Take 10 mEq by  mouth daily. SOMETIMES TAKES 1 ADDT'L TAB IN EVENING 07/11/15  Yes Rana Snare, MD  primidone (MYSOLINE) 250 MG tablet TAKE 1/2 TABLET THREE TIMES DAILY Patient taking differently: Take 125 mg by mouth 3 (three) times daily. TAKE 1/2 TABLET THREE TIMES DAILY 05/17/15  Yes Biagio Borg, MD  simvastatin (ZOCOR) 40 MG tablet TAKE 1 AND 1/2 TABLETS AT BEDTIME 05/17/15  Yes Biagio Borg, MD  tamsulosin (FLOMAX) 0.4 MG CAPS capsule Take 1 capsule (0.4 mg total) by mouth daily. Patient taking differently: Take 0.8 mg by mouth at bedtime.  05/17/15  Yes Biagio Borg, MD  timolol (TIMOPTIC) 0.5 % ophthalmic solution Place 1 drop into both eyes 2 (two) times daily.  05/30/15  Yes Historical Provider, MD  Travoprost, BAK Free, (TRAVATAN Z) 0.004 % SOLN ophthalmic solution Place 1 drop into both eyes at bedtime. 09/14/13  Yes Biagio Borg, MD  amLODipine (NORVASC) 5 MG tablet Take 5 mg by mouth daily. Reported on 09/16/2015    Historical Provider, MD  clotrimazole-betamethasone (LOTRISONE) cream Use as directed twice per day as needed Patient taking differently: Apply 1 application topically 2 (two) times daily.  05/04/14   Biagio Borg, MD  docusate sodium (COLACE) 100 MG capsule Take 1 capsule (100 mg total) by mouth 2 (two) times daily. 07/27/13   Cecilie Kicks, PA-C  hydrALAZINE (APRESOLINE) 10 MG tablet Take 1 tablet (10 mg total) by mouth 2 (two) times daily. Patient taking differently: Take 10 mg by mouth as directed. Takes bid only if systolic over [id number]  Diastolic over 80 [id number]   Ripudeep K Rai, MD  HYDROcodone-homatropine [facility]) 5-1.5 MG/5ML syrup Take 5 mLs by mouth every 6 (six) hours as needed for cough. 07/05/15   Biagio Borg, MD  losartan (COZAAR) 50 MG tablet Take 50 mg by mouth daily. Reported on 09/16/2015    Historical Provider, MD  meclizine (ANTIVERT) 25 MG tablet Take 1 tablet (25 mg total) by mouth 3 (three) times daily as needed for dizziness. 07/02/15  Ripudeep Krystal Eaton, MD  potassium  chloride (K-DUR,KLOR-CON) 10 MEQ tablet Take 1 tab by mouth per day 07/13/15   Biagio Borg, MD     Social History   Social History  . Marital Status: Married    Spouse Name: N/A  . Number of Children: N/A  . Years of Education: N/A   Occupational History  . retired from [employer] History Main Topics  . Smoking status: Former Smoker -- 2.00 packs/day for 20 years    Types: Cigarettes    Quit date: 10/08/1982  . Smokeless tobacco: Never Used  . Alcohol Use: No  . Drug Use: No  . Sexual Activity: Not Currently   Other Topics Concern  . Not on file   Social History Narrative    No family status information on file.   Family History  Problem Relation Age of Onset  . Heart attack Mother   . Cancer Father     lung  . Cancer Sister     lung  . Diabetes Sister   . Cancer Brother     lung that spread to brain       ROS:  Full 14 point review of systems complete and found to be negative unless listed above.  Physical Exam: Blood pressure 115/57, pulse 43, temperature 97.6 F (36.4 C), temperature source Oral, resp. rate 16, height 5\' 8"  (1.727 m), weight 191 lb 14.4 oz (87.045 kg), SpO2 97 %.  General: Well developed, well nourished, male in no acute distress Head: Eyes PERRLA, No xanthomas. Normocephalic and atraumatic, oropharynx without edema or exudate.  Lungs: Resp regular and unlabored. Faint basilar crackles which cleared with cath.  Heart: RRR no s3, s4, or murmurs..   Neck: No carotid bruits. No lymphadenopathy.  No JVD. Abdomen: Bowel sounds present, abdomen soft and non-tender without masses or hernias noted. Msk:  No spine or cva tenderness. No weakness, no joint deformities or effusions. Extremities: No clubbing, cyanosis or edema. DP/PT/Radials 2+ and equal bilaterally. Neuro: Alert and oriented X 3. No focal deficits noted. Psych:  Good affect, responds appropriately Skin: No rashes or lesions noted.  Labs:   Lab Results  Component Value  Date   WBC 5.2 09/16/2015   HGB 14.4 09/16/2015   HCT 40.3 09/16/2015   MCV 87.8 09/16/2015   PLT 194 09/16/2015   No results for input(s): INR in the last 72 hours.  Recent Labs Lab 09/17/15 0445  NA 131*  K 2.9*  CL 94*  CO2 27  BUN 7  CREATININE 0.85  CALCIUM 8.8*  PROT 5.7*  BILITOT 0.5  ALKPHOS 52  ALT 16*  AST 23  GLUCOSE 112*  ALBUMIN 3.4*   MAGNESIUM  Date Value Ref Range Status  09/17/2015 1.7 1.7 - 2.4 mg/dL Final    Recent Labs  09/17/15 0445 09/17/15 0933  TROPONINI <0.03 <0.03    Recent Labs  09/16/15 2259  TROPIPOC 0.01   No results found for: PROBNP Lab Results  Component Value Date   CHOL 140 09/17/2015   HDL 31* 09/17/2015   LDLCALC 77 09/17/2015   TRIG 160* 09/17/2015   No results found for: DDIMER LIPASE  Date/Time Value Ref Range Status  03/13/2009 11:35 PM 30 11 - 59 U/L Final   TSH  Date/Time Value Ref Range Status  09/17/2015 04:45 AM 2.040 0.350 - 4.500 uIU/mL Final  10/28/2012 03:42 PM 2.09 0.35 - 5.50 uIU/mL Final   VITAMIN B-12  Date/Time  Value Ref Range Status  07/10/2009 10:32 AM 435 211-911 pg/mL Final   FOLATE  Date/Time Value Ref Range Status  07/10/2009 10:32 AM >20.0 ng/mL ng/mL Final    Comment:    See lab report for associated comment(s)   FERRITIN  Date/Time Value Ref Range Status  07/10/2009 10:32 AM 23.3 22.0-322.0 ng/mL Final   IRON  Date/Time Value Ref Range Status  07/10/2009 10:32 AM 117 42-165 mcg/dL Final    CARDIAC CATHETERIZATION  INDICATIONS: Chest pain. Distal aorta and renals were done since the patient has severe labile hypertension.  Cine catheterization done with 5-French catheters from right femoral artery. At the end of the case, the arteriotomy was closed using Angio- Seal with good hemostasis.  Left main coronary artery was normal.  Left anterior descending artery had 20-30% multiple lesions in the mid and distal vessel. The LAD was large and  wrapped the apex. First diagonal branch had 20-30% multiple lesions.  The patient had a left dominant coronary circulation. Proximal mid and distal circ were normal. The first and second obtuse marginal branches were small vessels with 20-30% multiple lesions. There were 2 posterior lateral branches with 20-30% multiple lesions.  Right coronary artery was nondominant and normal.  RAO ventriculography: ROA ventriculography was normal. EF was 60%. There was no gradient across the aortic valve and no MR. Aortic pressure was 132/73. LV pressure was 130/12.  Selective injection of the renals showed 40-50% ostial lesion in the right renal artery. The left renal artery was large and widely patent. Distal aortography showed no significant abdominal aortic aneurysm, no significant plaque.  RAO ventriculography showed normal LV function with an EF of 60%. No regional wall motion abnormalities.  IMPRESSION: The patient's chest pain would appear to be noncardiac in etiology. His blood pressure was actually quite good in the lab. I did not believe the ostial lesion in the right renal artery should be flow limiting. He will continue his current blood pressure medications. He will be discharged later today if his groin looks good. He will follow up with Dr. Lovena Le or Dr. Olevia Perches in the office. He should have a renal duplex in 6 months to further assess the physiological significance of the ostial right stenosis.   ECG:    Vent. rate 46 BPM PR interval 168 ms QRS duration 96 ms QT/QTc 428/374 ms P-R-T axes 43 30 12  Radiology:  Dg Chest Portable 1 View  09/16/2015  CLINICAL DATA:  Bradycardia tonight. EXAM: PORTABLE CHEST 1 VIEW COMPARISON:  06/30/2014 FINDINGS: Shallow inspiration with atelectasis in the lung bases. Normal heart size and pulmonary vascularity. No focal airspace disease or consolidation in the lungs. No blunting of costophrenic  angles. No pneumothorax. Mediastinal contours appear intact. Calcification of the aorta. IMPRESSION: Shallow inspiration with atelectasis in the lung bases. Electronically Signed   By: Lucienne Capers M.D.   On: 09/16/2015 23:33    ASSESSMENT AND PLAN:     1. Chest pain at rest - With typical and atypical features. Substernal pressure - Lasting > 5 hours. ? Improved with CL nitro. Currently having 5/10 pressure. At peak 8/10.  - EKG non ischemic. BNP 88. No sign of volume overload on exam - Differential include PE given recent surgery vs GI given hx of hiatal hernia. Check d-dimer and give GI cocktail.  - Will get echocardiogram to assess LV function and WM abnormality. Will keep NPO after midnight if stress test vs cath needed in AM.  - BP stable -  trial of nitro paste. Continue IV heparin for now. MD to decide.   2. Essential hypertension - Stable and well controlled  3. Bradycardia - Avoid AV blocking agent. No syncope  4. Nonobstructive CAD - Per cath 2008 & 2010.  Otherwise per primary   Depression   CAD in native artery   CHF (congestive heart failure) (HCC)   Pulmonary hypertension (HCC)   Bladder cancer [facility])   Essential tremor   Chest pain   Anginal chest pain at rest [facility])   SignedLeanor Kail, PA 09/17/2015, 11:35 AM Pager [phone]  Co-Sign MD

## 2015-09-17 NOTE — Progress Notes (Signed)
ANTICOAGULATION CONSULT NOTE - Initial Consult  Pharmacy Consult for Heparin Indication: chest pain/ACS  Allergies  Allergen Reactions  . Morphine And Related Other (See Comments)    Migraine   . Hydrochlorothiazide Other (See Comments)    REACTION: hyponatremia with daily use  . Tape Itching    Patient  Is ok to use paper tape  . Lisinopril Cough  . Sulfa Antibiotics Rash    Patient Measurements: Height: 5\' 8"  (172.7 cm) Weight: 191 lb 14.4 oz (87.045 kg) (Scale A) IBW/kg (Calculated) : 68.4  Vital Signs: Temp: 97.4 F (36.3 C) (03/06 0258) Temp Source: Oral (03/06 0258) BP: 132/56 mmHg (03/06 0258) Pulse Rate: 45 (03/06 0258)  Labs:  Recent Labs  09/16/15 2247  HGB 14.4  HCT 40.3  PLT 194  CREATININE 0.82    Estimated Creatinine Clearance: 79.6 mL/min (by C-G formula based on Cr of 0.82).   Medical History: Past Medical History  Diagnosis Date  . Diverticulosis of colon (without mention of hemorrhage)   . Bradycardia   . Bladder cancer ([place])   . Allergic rhinitis   . CAD (coronary artery disease)   . CHF (congestive heart failure) ([place])   . Rheumatoid arthritis(714.0)   . HTN (hypertension)   . HLD (hyperlipidemia)   . GERD (gastroesophageal reflux disease)   . Depression   . History of colon polyps   . Glaucoma   . Fatty liver disease, nonalcoholic   . DDD (degenerative disc disease), lumbosacral   . History of peptic ulcer   . History of hiatal hernia   . Right renal artery stenosis (HCC)     per cath 03-14-2009 --- 40-50%  . RLS (restless legs syndrome)   . History of bladder cancer     2004--  TCC low-grade , non-invasive  . History of kidney stones   . BPH (benign prostatic hyperplasia)   . Weak urinary stream   . Nocturia   . History of exercise stress test     02-05-2007-- ETT (Clinically positive, electrically equivocal, submaximal ETT,  appropriate bp response to exercise  . Essential tremor   . Chronic low back pain   . Wears  glasses   . Wears dentures     upper  . Wears partial dentures     lower  . Wears hearing aid     bilateral  . Vertigo, intermittent   . Productive cough   . Acute bronchitis per pt no fever--  cough since discharge from hospital 07-02-2015    per pcp note 07-05-2015  mild to moderate bronchitis vs pna    Medications:  Prescriptions prior to admission  Medication Sig Dispense Refill Last Dose  . brimonidine (ALPHAGAN) 0.15 % ophthalmic solution Place 1 drop into both eyes 2 (two) times daily.   09/16/2015 at Unknown time  . cetirizine (ZYRTEC) 10 MG tablet Take 10 mg by mouth at bedtime.    09/15/2015 at Unknown time  . citalopram (CELEXA) 10 MG tablet Take 1 tablet (10 mg total) by mouth every morning. 90 tablet 3 09/16/2015 at Unknown time  . clotrimazole (LOTRIMIN) 1 % cream Apply 1 application topically 2 (two) times daily as needed (FOR RASH). Prescribed for rash in groin from [place] on 06/20/2014   Past Week at Unknown time  . diphenoxylate-atropine (LOMOTIL) 2.5-0.025 MG tablet Take 1 tablet by mouth 4 (four) times daily as needed for diarrhea or loose stools. 40 tablet 0 Past Month at Unknown time  . fluticasone (FLONASE) 50  MCG/ACT nasal spray USE 2 SPRAYS IN EACH NOSTRIL EVERY DAY AS NEEDED  FOR  RHINITIS/ALLERGIES (Patient taking differently: Place 2 sprays into both nostrils every evening. ) 48 g 3 09/15/2015 at Unknown time  . gabapentin (NEURONTIN) 300 MG capsule TAKE 1 TO 2 CAPSULES FOUR TIMES DAILY - AFTER MEALS AND AT BEDTIME. (Patient taking differently: Take 1,500 mg by mouth at bedtime. TAKES 5 CAPS AT BEDTIME, BUT CAN TAKE UP TO 4 ADDT'L CAPS AS NEEDED FOR NEUROPATHY) 720 capsule 3 09/16/2015 at Unknown time  . hydrochlorothiazide (HYDRODIURIL) 25 MG tablet Take 25 mg by mouth daily as needed (FOR FLUID).    09/15/2015 at Unknown time  . latanoprost (XALATAN) 0.005 % ophthalmic solution Place 1 drop into both eyes at bedtime.   09/15/2015 at Unknown time  . nitroGLYCERIN (NITROSTAT) 0.4  MG SL tablet Place 1 tablet (0.4 mg total) under the tongue every 5 (five) minutes as needed. Chest pain 60 tablet 5 NOT USED  . omeprazole (PRILOSEC) 20 MG capsule Take 20 mg by mouth 2 (two) times daily before a meal.   09/16/2015 at Unknown time  . oxyCODONE-acetaminophen (PERCOCET/ROXICET) 5-325 MG tablet Take 1 tablet by mouth every 4 (four) hours as needed for severe pain.   Past Week at Unknown time  . potassium chloride (K-DUR) 10 MEQ tablet Take 1 tablet (10 mEq total) by mouth 3 (three) times daily. (Patient taking differently: Take 10 mEq by mouth daily. SOMETIMES TAKES 1 ADDT'L TAB IN EVENING) 90 tablet 0 09/16/2015 at Unknown time  . primidone (MYSOLINE) 250 MG tablet TAKE 1/2 TABLET THREE TIMES DAILY (Patient taking differently: Take 125 mg by mouth 3 (three) times daily. TAKE 1/2 TABLET THREE TIMES DAILY) 135 tablet 3 09/16/2015 at Unknown time  . simvastatin (ZOCOR) 40 MG tablet TAKE 1 AND 1/2 TABLETS AT BEDTIME 135 tablet 3 09/15/2015 at Unknown time  . tamsulosin (FLOMAX) 0.4 MG CAPS capsule Take 1 capsule (0.4 mg total) by mouth daily. (Patient taking differently: Take 0.8 mg by mouth at bedtime. ) 90 capsule 3 09/15/2015 at Unknown time  . timolol (TIMOPTIC) 0.5 % ophthalmic solution Place 1 drop into both eyes 2 (two) times daily.   1 09/16/2015 at Unknown time  . Travoprost, BAK Free, (TRAVATAN Z) 0.004 % SOLN ophthalmic solution Place 1 drop into both eyes at bedtime. 5 mL 5 09/15/2015 at Unknown time  . amLODipine (NORVASC) 5 MG tablet Take 5 mg by mouth daily. Reported on 09/16/2015   Not Taking at Unknown time  . clotrimazole-betamethasone (LOTRISONE) cream Use as directed twice per day as needed (Patient taking differently: Apply 1 application topically 2 (two) times daily. ) 15 g 1 Taking  . docusate sodium (COLACE) 100 MG capsule Take 1 capsule (100 mg total) by mouth 2 (two) times daily. 60 capsule 0 Taking  . hydrALAZINE (APRESOLINE) 10 MG tablet Take 1 tablet (10 mg total) by mouth 2  (two) times daily. (Patient taking differently: Take 10 mg by mouth as directed. Takes bid only if systolic over [id number]  Diastolic over 80) 60 tablet 3 Taking  . HYDROcodone-homatropine (HYCODAN) 5-1.5 MG/5ML syrup Take 5 mLs by mouth every 6 (six) hours as needed for cough. 180 mL 0 Taking  . losartan (COZAAR) 50 MG tablet Take 50 mg by mouth daily. Reported on 09/16/2015   Not Taking at Unknown time  . meclizine (ANTIVERT) 25 MG tablet Take 1 tablet (25 mg total) by mouth 3 (three) times daily as needed for  dizziness. 90 tablet 3 Taking  . potassium chloride (K-DUR,KLOR-CON) 10 MEQ tablet Take 1 tab by mouth per day 90 tablet 3 Taking    Assessment: 80 y.o. male with chest pain for heparin  Goal of Therapy:  Heparin level 0.3-0.7 units/ml Monitor platelets by anticoagulation protocol: Yes   Plan:  Heparin 4000 units IV bolus, then start heparin 1000 units/hr Check heparin level in 6 hours.   Caryl Pina 09/17/2015,4:11 AM

## 2015-09-17 NOTE — Progress Notes (Signed)
Pt on heparin drip @ 10.5 ml/hr and NTG paste  C/O Chest pain 6/10, mid-sternal, dull in nature; BP 131/64. Given SL nitroglycerin x 2 with no change. Admits feeling like "indigestion" Given Maalox 30 ml per cardiology standing order. Verbalized some relief with pain down to 3/10. Dr Janece Canterbury paged and made aware. EKG Obtained. Orders received.

## 2015-09-17 NOTE — Progress Notes (Signed)
ANTICOAGULATION CONSULT NOTE - Follow Up Consult  Pharmacy Consult for Heparin Indication: chest pain/ACS  Allergies  Allergen Reactions  . Morphine And Related Other (See Comments)    Migraine   . Hydrochlorothiazide Other (See Comments)    REACTION: hyponatremia with daily use  . Tape Itching    Patient  Is ok to use paper tape  . Lisinopril Cough  . Sulfa Antibiotics Rash    Patient Measurements: Height: 5\' 8"  (172.7 cm) Weight: 191 lb 14.4 oz (87.045 kg) (Scale A) IBW/kg (Calculated) : 68.4 Heparin Dosing Weight: 86 kg  Vital Signs:    Labs:  Recent Labs  09/16/15 2247 09/17/15 0445 09/17/15 0933 09/17/15 1623  HGB 14.4  --   --   --   HCT 40.3  --   --   --   PLT 194  --   --   --   HEPARINUNFRC  --   --  0.32 0.30  CREATININE 0.82 0.85  --   --   TROPONINI  --  <0.03 <0.03 <0.03    Estimated Creatinine Clearance: 76.8 mL/min (by C-G formula based on Cr of 0.85).   Assessment: 4 YOM with chest pain, on IV heparin, troponin neg x 2, continue heparin and checking D-dimer for PE r/o.   Heparin level this evening remains therapeutic (HL 0.3 << 0.32, goal of 0.3-0.7) however it was drawn ~1 hour early. No bleeding noted at this time. Will increase the rate slightly to keep within range and f/u with a HL in the AM.   Goal of Therapy:  Heparin level 0.3-0.7 units/ml Monitor platelets by anticoagulation protocol: Yes   Plan:  1. Increase heparin to 1050 units/hr (10.5 ml/hr) 2. Will continue to monitor for any signs/symptoms of bleeding and will follow up with heparin level in the a.m.   Alycia Rossetti, PharmD, BCPS Clinical Pharmacist Pager: [phone] 09/17/2015 5:59 PM

## 2015-09-17 NOTE — Progress Notes (Signed)
[facility] History and Physical  [name] Casey [id number] DOB: 1935-09-16 DOA: 09/16/2015   PCP: Cathlean Cower, MD    Chief Complaint: Chest pain  HPI: [name] is a 31 WM PMHx Depression, Essential Tremors, Bradycardia, Essential HTN, CAD native artery, CHF?, Pulmonary HTN, Bladder Ca CHF?, Pulmonary HTN,   Brought in by EMS who presents to the Emergency Department complaining of acute onset, constant, central sharp and pressured chest pain onset 5 hours ago. Positive tightness in chest, positive bilateral jaw pain, started at rest, positive shortness of breath, negative N/V, negative diaphoresis, plus radiation down left arm. Started at ~1600. Positive continued jaw pain/left shoulder pain . Pt was given 325 mg ASA and 1 nitro tablet en route by EMS to no relief. He did not take anything at home prior to being picked up by EMS. He says that his HR in the ED is slower than usual. He denies appetite change, fever, chills, nausea, vomiting, or diaphoresis. Pt's last stress test was performed in 2008.    Review of Systems: The patient denies anorexia, fever, weight loss,, vision loss, decreased hearing, hoarseness, chest pain, syncope, dyspnea on exertion, peripheral edema, balance deficits, hemoptysis, abdominal pain, melena, hematochezia, severe indigestion/heartburn, hematuria, incontinence, genital sores, muscle weakness, suspicious skin lesions, transient blindness, difficulty walking, depression, unusual weight change, abnormal bleeding, enlarged lymph nodes, angioedema, and breast masses.    TRAVEL HISTORY: NA   Consultants:  NA  Procedure/Significant Events:  NA    Culture  NA   Antibiotics:   NA  DVT prophylaxis:    Devices     LINES / TUBES:     Past Medical History  Diagnosis Date  . Diverticulosis of colon (without mention of hemorrhage)   . Bradycardia   . Bladder cancer ([place])   . Allergic rhinitis   . CAD (coronary artery disease)   . CHF  (congestive heart failure) ([place])   . Rheumatoid arthritis(714.0)   . HTN (hypertension)   . HLD (hyperlipidemia)   . GERD (gastroesophageal reflux disease)   . Depression   . History of colon polyps   . Glaucoma   . Fatty liver disease, nonalcoholic   . DDD (degenerative disc disease), lumbosacral   . History of peptic ulcer   . History of hiatal hernia   . Right renal artery stenosis (HCC)     per cath 03-14-2009 --- 40-50%  . RLS (restless legs syndrome)   . History of bladder cancer     2004--  TCC low-grade , non-invasive  . History of kidney stones   . BPH (benign prostatic hyperplasia)   . Weak urinary stream   . Nocturia   . History of exercise stress test     02-05-2007-- ETT (Clinically positive, electrically equivocal, submaximal ETT,  appropriate bp response to exercise  . Essential tremor   . Chronic low back pain   . Wears glasses   . Wears dentures     upper  . Wears partial dentures     lower  . Wears hearing aid     bilateral  . Vertigo, intermittent   . Productive cough   . Acute bronchitis per pt no fever--  cough since discharge from hospital 07-02-2015    per pcp note 07-05-2015  mild to moderate bronchitis vs pna   Past Surgical History  Procedure Laterality Date  . Lumbar disc surgery  07-16-2005  . Lumbar laminectomy/decompression microdiscectomy  07/09/2011    Procedure: LUMBAR LAMINECTOMY/DECOMPRESSION MICRODISCECTOMY;  Surgeon: Johnn Hai;  Location: [facility];  Service: Orthopedics;  Laterality: Left;  Decompression L5 - S1 on the Left and repair of dura  . Lumbar laminectomy/decompression microdiscectomy N/A 07/27/2013    Procedure: DECOMPRESSION L4 - L5 WITH EXCISION OF SYNOVIAL CYST AND, LATERAL MASS FUSION 1 LEVEL;  Surgeon: Johnn Hai, MD;  Location: [facility];  Service: Orthopedics;  Laterality: N/A;  . Lumbar laminectomy/decompression microdiscectomy Left 07/06/2014    Procedure:  MICRO LUMBAR DECOMPRESSION L5-S1 ON LEFT, L4-5 REDO;   Surgeon: Johnn Hai, MD;  Location: [facility];  Service: Orthopedics;  Laterality: Left;  . Cardiac catheterization  02-12-2007  dr gregg taylor    Non-obstructive CAD,  20% pLCX,  20% LAD, ef 65%  . Cardiac catheterization  03-14-2009  dr Johnsie Cancel    pLAD and mid diaginal 20-30% multiple lesions,  OM1 20%, right renal ostial 40-50%,  ef 60%  . Transurethral resection of bladder tumor  09-21-2002  . Tonsillectomy and adenoidectomy    . Hip arthroscopy w/ labral debridement Left 06-29-2000    and chondraplasty  . Transthoracic echocardiogram  09-20-2010    normal LVF, ef 55-65%/  mild MR, TR and PR/  mild LAE  . Cataract extraction w/ intraocular lens  implant, bilateral  2016  . Cystoscopy with biopsy N/A 07/11/2015    Procedure: CYSTOSCOPY WITH COLD CUP RESECTION AND FULGERATION;  Surgeon: Rana Snare, MD;  Location: [facility];  Service: Urology;  Laterality: N/A;   Social History:  reports that he quit smoking about 32 years ago. His smoking use included Cigarettes. He has a 40 pack-year smoking history. He has never used smokeless tobacco. He reports that he does not drink alcohol or use illicit drugs. where does patient live--home, ALF, SNF? home  Can patient participate in ADLs? Yes  Allergies  Allergen Reactions  . Morphine And Related Other (See Comments)    Migraine   . Hydrochlorothiazide Other (See Comments)    REACTION: hyponatremia with daily use  . Tape Itching    Patient  Is ok to use paper tape  . Lisinopril Cough  . Sulfa Antibiotics Rash    Family History  Problem Relation Age of Onset  . Heart attack Mother   . Cancer Father     lung  . Cancer Sister     lung  . Diabetes Sister   . Cancer Brother     lung that spread to brain  Spoke with Patient stated no Mental illness, CVA, Thyroid Dz or Cancer in their family history.   Prior to Admission medications   Medication Sig Start Date End Date Taking? Authorizing Provider  brimonidine  (ALPHAGAN) 0.15 % ophthalmic solution Place 1 drop into both eyes 2 (two) times daily.   Yes Historical Provider, MD  cetirizine (ZYRTEC) 10 MG tablet Take 10 mg by mouth at bedtime.  10/28/12  Yes Biagio Borg, MD  citalopram (CELEXA) 10 MG tablet Take 1 tablet (10 mg total) by mouth every morning. 05/17/15  Yes Biagio Borg, MD  clotrimazole (LOTRIMIN) 1 % cream Apply 1 application topically 2 (two) times daily as needed (FOR RASH). Prescribed for rash in groin from [place] on 06/20/2014   Yes Historical Provider, MD  diphenoxylate-atropine (LOMOTIL) 2.5-0.025 MG tablet Take 1 tablet by mouth 4 (four) times daily as needed for diarrhea or loose stools. 07/27/15  Yes Biagio Borg, MD  fluticasone (FLONASE) 50 MCG/ACT nasal spray USE 2 SPRAYS IN EACH NOSTRIL  EVERY DAY AS NEEDED  FOR  RHINITIS/ALLERGIES Patient taking differently: Place 2 sprays into both nostrils every evening.  05/17/15  Yes Biagio Borg, MD  gabapentin (NEURONTIN) 300 MG capsule TAKE 1 TO 2 CAPSULES FOUR TIMES DAILY - AFTER MEALS AND AT BEDTIME. Patient taking differently: Take 1,500 mg by mouth at bedtime. TAKES 5 CAPS AT BEDTIME, BUT CAN TAKE UP TO 4 ADDT'L CAPS AS NEEDED FOR NEUROPATHY 05/17/15  Yes Biagio Borg, MD  hydrochlorothiazide (HYDRODIURIL) 25 MG tablet Take 25 mg by mouth daily as needed (FOR FLUID).  07/06/15  Yes Historical Provider, MD  latanoprost (XALATAN) 0.005 % ophthalmic solution Place 1 drop into both eyes at bedtime.   Yes Historical Provider, MD  nitroGLYCERIN (NITROSTAT) 0.4 MG SL tablet Place 1 tablet (0.4 mg total) under the tongue every 5 (five) minutes as needed. Chest pain 08/26/11  Yes Biagio Borg, MD  omeprazole (PRILOSEC) 20 MG capsule Take 20 mg by mouth 2 (two) times daily before a meal.   Yes Historical Provider, MD  oxyCODONE-acetaminophen (PERCOCET/ROXICET) 5-325 MG tablet Take 1 tablet by mouth every 4 (four) hours as needed for severe pain.   Yes Historical Provider, MD  potassium chloride (K-DUR) 10  MEQ tablet Take 1 tablet (10 mEq total) by mouth 3 (three) times daily. Patient taking differently: Take 10 mEq by mouth daily. SOMETIMES TAKES 1 ADDT'L TAB IN EVENING 07/11/15  Yes Rana Snare, MD  primidone (MYSOLINE) 250 MG tablet TAKE 1/2 TABLET THREE TIMES DAILY Patient taking differently: Take 125 mg by mouth 3 (three) times daily. TAKE 1/2 TABLET THREE TIMES DAILY 05/17/15  Yes Biagio Borg, MD  simvastatin (ZOCOR) 40 MG tablet TAKE 1 AND 1/2 TABLETS AT BEDTIME 05/17/15  Yes Biagio Borg, MD  tamsulosin (FLOMAX) 0.4 MG CAPS capsule Take 1 capsule (0.4 mg total) by mouth daily. Patient taking differently: Take 0.8 mg by mouth at bedtime.  05/17/15  Yes Biagio Borg, MD  timolol (TIMOPTIC) 0.5 % ophthalmic solution Place 1 drop into both eyes 2 (two) times daily.  05/30/15  Yes Historical Provider, MD  Travoprost, BAK Free, (TRAVATAN Z) 0.004 % SOLN ophthalmic solution Place 1 drop into both eyes at bedtime. 09/14/13  Yes Biagio Borg, MD  amLODipine (NORVASC) 5 MG tablet Take 5 mg by mouth daily. Reported on 09/16/2015    Historical Provider, MD  clotrimazole-betamethasone (LOTRISONE) cream Use as directed twice per day as needed Patient taking differently: Apply 1 application topically 2 (two) times daily.  05/04/14   Biagio Borg, MD  docusate sodium (COLACE) 100 MG capsule Take 1 capsule (100 mg total) by mouth 2 (two) times daily. 07/27/13   Cecilie Kicks, PA-C  hydrALAZINE (APRESOLINE) 10 MG tablet Take 1 tablet (10 mg total) by mouth 2 (two) times daily. Patient taking differently: Take 10 mg by mouth as directed. Takes bid only if systolic over [id number]  Diastolic over 80 [id number]   Ripudeep K Rai, MD  HYDROcodone-homatropine [facility]) 5-1.5 MG/5ML syrup Take 5 mLs by mouth every 6 (six) hours as needed for cough. 07/05/15   Biagio Borg, MD  losartan (COZAAR) 50 MG tablet Take 50 mg by mouth daily. Reported on 09/16/2015    Historical Provider, MD  meclizine (ANTIVERT) 25 MG tablet Take 1 tablet  (25 mg total) by mouth 3 (three) times daily as needed for dizziness. 07/02/15   Ripudeep Krystal Eaton, MD  potassium chloride (K-DUR,KLOR-CON) 10 MEQ tablet Take 1 tab  by mouth per day 07/13/15   Biagio Borg, MD   Physical Exam: Filed Vitals:   09/16/15 2245 09/16/15 2300 09/16/15 2330 09/17/15 0000  BP:  134/70 126/70 124/82  Pulse: 45 44 43 43  Temp:      TempSrc:      Resp: 11   10  SpO2: 95% 95% 95% 96%     General: A/O 4, rates jaw/shoulder pain 3/10, No acute respiratory distress Eyes: Negative headache, eye pain, double vision, scotomas, floaters, negative scleral hemorrhage ENT: Negative Runny nose, negative ear pain, negative tinnitus, negative gingival bleeding, negative odynophagia Neck:  Negative scars, masses, torticollis, lymphadenopathy, JVD Lungs: Clear to auscultation bilaterally without wheezes or crackles Cardiovascular: Regular rate and rhythm without murmur gallop or rub normal S1 and S2 Abdomen:negative abdominal pain, negative dysphagia, Nontender, nondistended, soft, bowel sounds positive, no rebound, no ascites, no appreciable mass Extremities: No significant cyanosis, clubbing, or edema bilateral lower extremities Psychiatric:  Negative depression, negative anxiety, negative fatigue, negative mania  Neurologic:  Cranial nerves II through XII intact, tongue/uvula midline, all extremities muscle strength 5/5, sensation intact throughout, negative dysarthria, negative expressive aphasia, negative receptive aphasia.  Skin/Hemolytic/lymphatic; negative purpura, petechia,     Labs on Admission:  Basic Metabolic Panel:  Recent Labs Lab 09/12/15 1547 09/16/15 2247  NA 130* 131*  K 3.4* 3.9  CL 93* 93*  CO2 32 26  GLUCOSE 97 86  BUN 8 10  CREATININE 0.82 0.82  CALCIUM 9.3 9.1   Liver Function Tests: No results for input(s): AST, ALT, ALKPHOS, BILITOT, PROT, ALBUMIN in the last 168 hours. No results for input(s): LIPASE, AMYLASE in the last 168 hours. No  results for input(s): AMMONIA in the last 168 hours. CBC:  Recent Labs Lab 09/16/15 2247  WBC 5.2  HGB 14.4  HCT 40.3  MCV 87.8  PLT 194   Cardiac Enzymes: No results for input(s): CKTOTAL, CKMB, CKMBINDEX, TROPONINI in the last 168 hours.  BNP (last 3 results) No results for input(s): BNP in the last 8760 hours.  ProBNP (last 3 results) No results for input(s): PROBNP in the last 8760 hours.  CBG: No results for input(s): GLUCAP in the last 168 hours.  Radiological Exams on Admission: Dg Chest Portable 1 View  09/16/2015  CLINICAL DATA:  Bradycardia tonight. EXAM: PORTABLE CHEST 1 VIEW COMPARISON:  06/30/2014 FINDINGS: Shallow inspiration with atelectasis in the lung bases. Normal heart size and pulmonary vascularity. No focal airspace disease or consolidation in the lungs. No blunting of costophrenic angles. No pneumothorax. Mediastinal contours appear intact. Calcification of the aorta. IMPRESSION: Shallow inspiration with atelectasis in the lung bases. Electronically Signed   By: Lucienne Capers M.D.   On: 09/16/2015 23:33    EKG: Sinus bradycardia Nonspecific ST changes in 3, aVF  Assessment/Plan Principal Problem:   Chest pain at rest Active Problems:   Essential hypertension   Bradycardia   Depression   CAD in native artery   CHF (congestive heart failure) (HCC)   Pulmonary hypertension (HCC)   Bladder cancer (HCC)   Chest pain at rest -HEART Score= 6 -Patient not on beta blocker secondary to chronic bradycardia usually in the 50s, currently in the 40s  Essential hypertension -Amlodipine 5 mg daily  Bradycardia -See chest pain  CHF? -Echocardiogram pending, prior echocardiogram 11/02/2008 does not show CHF  Pulmonary hypertension -Echocardiogram 11/02/2008 shows borderline pulmonary HTN -See CHF  Depression -Continue Celexa 10 mg daily  Essential tremors -Continue primidone 125 mg TID  Bladder cancer  Code Status: Full Family Communication:  None present Disposition Plan: Completion chest pain workup   Care during the described time interval was provided by me .  I have reviewed this patient's available data, including medical history, events of note, physical examination, and all test results as part of my evaluation. I have personally reviewed and interpreted all radiology studies.    Time spent: 55 minutes Allie Bossier [facility] Pager [phone]  If 7PM-7AM, please contact night-coverage [email or web address] Password [email or web address] 09/17/2015, 1:09 AM

## 2015-09-17 NOTE — Progress Notes (Addendum)
[facility] PROGRESS NOTE  [name] [id number] DOB: 1936-04-09 DOA: 09/16/2015 PCP: Cathlean Cower, MD  Brief Summary  The patient is a 80 yo M with history of bradycardia, HTN, CAD, possible chronic heart failure and pulmonary hypertension, bladder cancer who presented with acute onset, constant, central sharp and pressured chest pain onset 5 hours prior to admission. Positive tightness in chest, positive bilateral jaw pain, started at rest, positive shortness of breath, negative N/V, negative diaphoresis, plus radiation down left arm. Started at ~1600. Positive continued jaw pain/left shoulder pain . Pt was given 325 mg ASA and 1 nitro tablet en route by EMS to no relief. He did not take anything at home prior to being picked up by EMS. He says that his HR in the ED is slower than usual. He denies appetite change, fever, chills, nausea, vomiting, or diaphoresis. Pt's last stress test was performed in 2008.     Assessment/Plan  Chest pain, HEART Score = 6.  Jaw pain improved with NTG, but still having some ongoing substernal chest pressure.  Dr. Lovena Le patient.  Hx of angioplasty. -  Telemetry:  Sinus bradycardia -  ECG with stable mild ST segment depressions in inferolateral leads -  Troponins neg -  A1c pending -  LDL 77, HDL 31, TG 160, total cholesterol 140 -  Daily aspirin -  No Beta blocker due to bradycardia -  Start high dose statin -  NTG prn chest pain -  Trial of NTG paste -  Heparin gtt -  Cardiology consult pending -  Agree with d-dimer given hx of malignancy  Essential hypertension - d/c ARB and norvasc due to borderline low BP  Bradycardia - avoid AV nodal blocking medications  CHF? -Echocardiogram pending  Pulmonary hypertension -Echocardiogram 11/02/2008 shows borderline pulmonary HTN  Depression -Continue Celexa 10 mg daily  Essential tremors -Continue primidone 125 mg TID  Bladder cancer (transitional cell carcinoma) s/p resection 06/2015 by Dr.  Risa Grill -  F/u urology  Hypokalemia and borderline hypomagnesemia -  Oral potassium repletion -  IV and oral magnesium repletion -  Repeat K and magnesium in AM  Diet:  NPO Access:  PIV IVF:  yes Proph:  Heparin gtt  Code Status: full Family Communication: patient and wife and son Disposition Plan: pending cardiology assessment and probable further inpatient work up   Consultants:  Cardiology  Procedures:  CXR  Antibiotics:  none   HPI/Subjective:  Jaw pain has resolved with NTG, but still having some substernal chest pressure.  Denies sourness/reflux symptoms.  Has had some dyspnea which is not associated with his chest pain.  Denies nausea, diaphoresis.  Had some lightheadedness earlier.    Objective: Filed Vitals:   09/17/15 0130 09/17/15 0200 09/17/15 0258 09/17/15 0509  BP: 125/62 121/61 132/56 115/57  Pulse: 43 42 45 43  Temp:   97.4 F (36.3 C) 97.6 F (36.4 C)  TempSrc:   Oral Oral  Resp: 13 11 16 16   Height:   5\' 8"  (1.727 m)   Weight:   87.045 kg (191 lb 14.4 oz)   SpO2: 94% 97% 98% 97%    Intake/Output Summary (Last 24 hours) at 09/17/15 1144 Last data filed at 09/17/15 1000  Gross per 24 hour  Intake 848.83 ml  Output    450 ml  Net 398.83 ml   Filed Weights   09/17/15 0258  Weight: 87.045 kg (191 lb 14.4 oz)   Body mass index is 29.18 kg/(m^2).  Exam:  General:  Adult male, No acute distress  HEENT:  NCAT, MMM  Cardiovascular:  Bradycardic, RR, nl S1, S2 no mrg, 2+ pulses, warm extremities  Respiratory:  CTAB, no increased WOB  Abdomen:   NABS, soft, NT/ND  MSK:   Normal tone and bulk, no LEE  Neuro:  Grossly intact  Data Reviewed: Basic Metabolic Panel:  Recent Labs Lab 09/12/15 1547 09/16/15 2247 09/17/15 0445  NA 130* 131* 131*  K 3.4* 3.9 2.9*  CL 93* 93* 94*  CO2 32 26 27  GLUCOSE 97 86 112*  BUN 8 10 7   CREATININE 0.82 0.82 0.85  CALCIUM 9.3 9.1 8.8*  MG  --   --  1.7   Liver Function Tests:  Recent  Labs Lab 09/17/15 0445  AST 23  ALT 16*  ALKPHOS 52  BILITOT 0.5  PROT 5.7*  ALBUMIN 3.4*   No results for input(s): LIPASE, AMYLASE in the last 168 hours. No results for input(s): AMMONIA in the last 168 hours. CBC:  Recent Labs Lab 09/16/15 2247  WBC 5.2  HGB 14.4  HCT 40.3  MCV 87.8  PLT 194    No results found for this or any previous visit (from the past 240 hour(s)).   Studies: Dg Chest Portable 1 View  09/16/2015  CLINICAL DATA:  Bradycardia tonight. EXAM: PORTABLE CHEST 1 VIEW COMPARISON:  06/30/2014 FINDINGS: Shallow inspiration with atelectasis in the lung bases. Normal heart size and pulmonary vascularity. No focal airspace disease or consolidation in the lungs. No blunting of costophrenic angles. No pneumothorax. Mediastinal contours appear intact. Calcification of the aorta. IMPRESSION: Shallow inspiration with atelectasis in the lung bases. Electronically Signed   By: Lucienne Capers M.D.   On: 09/16/2015 23:33    Scheduled Meds: . amLODipine  5 mg Oral Daily  . aspirin EC  81 mg Oral Daily  . atorvastatin  80 mg Oral q1800  . brimonidine  1 drop Both Eyes BID  . citalopram  10 mg Oral q morning - 10a  . clotrimazole  1 application Topical BID  . fluticasone  2 spray Each Nare QPM  . gabapentin  1,500 mg Oral QHS  . gi cocktail  30 mL Oral Once  . latanoprost  1 drop Both Eyes QHS  . losartan  50 mg Oral Daily  . magnesium oxide  200 mg Oral BID  . pantoprazole  40 mg Oral Daily  . potassium chloride  40 mEq Oral Q4H  . primidone  125 mg Oral TID  . tamsulosin  0.8 mg Oral QHS   Continuous Infusions: . sodium chloride 75 mL/hr at 09/17/15 0508  . heparin 1,000 Units/hr (09/17/15 0507)    Principal Problem:   Chest pain at rest Active Problems:   Essential hypertension   Bradycardia   Depression   CAD in native artery   CHF (congestive heart failure) (HCC)   Pulmonary hypertension (HCC)   Bladder cancer (HCC)   Essential tremor   Chest  pain   Anginal chest pain at rest [facility])    Time spent: 30 min    Buck Mcaffee, [facility] Pager [phone]. If 7PM-7AM, please contact night-coverage at [email or web address], password [facility] 09/17/2015, 11:44 AM

## 2015-09-18 ENCOUNTER — Observation Stay (HOSPITAL_BASED_OUTPATIENT_CLINIC_OR_DEPARTMENT_OTHER): Payer: Medicare Other

## 2015-09-18 ENCOUNTER — Observation Stay (HOSPITAL_COMMUNITY): Payer: Medicare Other

## 2015-09-18 ENCOUNTER — Other Ambulatory Visit (HOSPITAL_COMMUNITY): Payer: Medicare Other

## 2015-09-18 DIAGNOSIS — I2510 Atherosclerotic heart disease of native coronary artery without angina pectoris: Secondary | ICD-10-CM | POA: Diagnosis not present

## 2015-09-18 DIAGNOSIS — I10 Essential (primary) hypertension: Secondary | ICD-10-CM | POA: Diagnosis not present

## 2015-09-18 DIAGNOSIS — R079 Chest pain, unspecified: Secondary | ICD-10-CM

## 2015-09-18 DIAGNOSIS — R0789 Other chest pain: Secondary | ICD-10-CM | POA: Diagnosis not present

## 2015-09-18 DIAGNOSIS — R001 Bradycardia, unspecified: Secondary | ICD-10-CM | POA: Diagnosis not present

## 2015-09-18 LAB — CBC
HEMATOCRIT: 41.9 % (ref 39.0–52.0)
Hemoglobin: 14.3 g/dL (ref 13.0–17.0)
MCH: 30.3 pg (ref 26.0–34.0)
MCHC: 34.1 g/dL (ref 30.0–36.0)
MCV: 88.8 fL (ref 78.0–100.0)
Platelets: 205 10*3/uL (ref 150–400)
RBC: 4.72 MIL/uL (ref 4.22–5.81)
RDW: 12.9 % (ref 11.5–15.5)
WBC: 6 10*3/uL (ref 4.0–10.5)

## 2015-09-18 LAB — TROPONIN I: Troponin I: <0.03 ng/mL (ref <0.031)

## 2015-09-18 LAB — BASIC METABOLIC PANEL
ANION GAP: 8 (ref 5–15)
BUN: 7 mg/dL (ref 6–20)
CALCIUM: 8.8 mg/dL — AB (ref 8.9–10.3)
CO2: 27 mmol/L (ref 22–32)
Chloride: 102 mmol/L (ref 101–111)
Creatinine, Ser: 0.83 mg/dL (ref 0.61–1.24)
Glucose, Bld: 93 mg/dL (ref 65–99)
Potassium: 4.4 mmol/L (ref 3.5–5.1)
Sodium: 137 mmol/L (ref 135–145)

## 2015-09-18 LAB — GLUCOSE, CAPILLARY: Glucose-Capillary: 100 mg/dL — ABNORMAL HIGH (ref 65–99)

## 2015-09-18 LAB — NM MYOCAR MULTI W/SPECT W/WALL MOTION / EF
CHL CUP NUCLEAR SDS: 1
CHL CUP RESTING HR STRESS: 56 {beats}/min
CHL RATE OF PERCEIVED EXERTION: 0
CSEPED: 0 min
CSEPEDS: 0 s
CSEPEW: 1 METS
LV sys vol: 40 mL
LVDIAVOL: 101 mL
MPHR: 142 {beats}/min
Peak HR: 81 {beats}/min
Percent HR: 57 %
RATE: 0.33
SRS: 6
SSS: 7
TID: 1.24

## 2015-09-18 LAB — HEPARIN LEVEL (UNFRACTIONATED): Heparin Unfractionated: 0.19 IU/mL — ABNORMAL LOW (ref 0.30–0.70)

## 2015-09-18 LAB — MAGNESIUM: Magnesium: 2.1 mg/dL (ref 1.7–2.4)

## 2015-09-18 MED ORDER — REGADENOSON 0.4 MG/5ML IV SOLN
0.4000 mg | Freq: Once | INTRAVENOUS | Status: AC
  Administered 2015-09-18: 0.4 mg via INTRAVENOUS
  Filled 2015-09-18: qty 5

## 2015-09-18 MED ORDER — TECHNETIUM TC 99M SESTAMIBI GENERIC - CARDIOLITE
10.0000 | Freq: Once | INTRAVENOUS | Status: AC | PRN
  Administered 2015-09-18: 10 via INTRAVENOUS

## 2015-09-18 MED ORDER — PANTOPRAZOLE SODIUM 40 MG PO TBEC: 40.0000 mg | DELAYED_RELEASE_TABLET | Freq: Two times a day (BID) | ORAL | Status: DC

## 2015-09-18 MED ORDER — REGADENOSON 0.4 MG/5ML IV SOLN
INTRAVENOUS | Status: AC
  Administered 2015-09-18: 0.4 mg via INTRAVENOUS
  Filled 2015-09-18: qty 5

## 2015-09-18 MED ORDER — MAGNESIUM OXIDE 400 (241.3 MG) MG PO TABS: 200.0000 mg | ORAL_TABLET | Freq: Two times a day (BID) | ORAL | Status: DC

## 2015-09-18 MED ORDER — GABAPENTIN 300 MG PO CAPS: 1500.0000 mg | ORAL_CAPSULE | Freq: Every day | ORAL | Status: DC

## 2015-09-18 MED ORDER — TECHNETIUM TC 99M SESTAMIBI GENERIC - CARDIOLITE
30.0000 | Freq: Once | INTRAVENOUS | Status: AC | PRN
  Administered 2015-09-18: 30 via INTRAVENOUS

## 2015-09-18 MED ORDER — PRIMIDONE 250 MG PO TABS: 125.0000 mg | ORAL_TABLET | Freq: Three times a day (TID) | ORAL | Status: DC

## 2015-09-18 MED ORDER — HYDRALAZINE HCL 10 MG PO TABS: 10.0000 mg | ORAL_TABLET | ORAL | Status: DC

## 2015-09-18 MED ORDER — SUCRALFATE 1 GM/10ML PO SUSP: 1.0000 g | Freq: Three times a day (TID) | ORAL | Status: DC

## 2015-09-18 MED ORDER — ASPIRIN 81 MG PO TBEC: 81.0000 mg | DELAYED_RELEASE_TABLET | Freq: Every day | ORAL | Status: DC

## 2015-09-18 NOTE — Progress Notes (Signed)
Discharge instructions and prescriptions given to .Verbalized understanding

## 2015-09-18 NOTE — Progress Notes (Signed)
ANTICOAGULATION CONSULT NOTE - Initial Consult  Pharmacy Consult for Heparin Indication: chest pain/ACS  Allergies  Allergen Reactions  . Morphine And Related Other (See Comments)    Migraine   . Hydrochlorothiazide Other (See Comments)    REACTION: hyponatremia with daily use  . Tape Itching    Patient  Is ok to use paper tape  . Lisinopril Cough  . Sulfa Antibiotics Rash    Patient Measurements: Height: 5\' 8"  (172.7 cm) Weight: 191 lb 1.6 oz (86.682 kg) IBW/kg (Calculated) : 68.4  Vital Signs: Temp: 97.7 F (36.5 C) (03/07 0819) Temp Source: Oral (03/07 0819) BP: 149/63 mmHg (03/07 0819) Pulse Rate: 52 (03/07 0819)  Labs:  Recent Labs  09/16/15 2247 09/17/15 0445 09/17/15 0933 09/17/15 1623 09/18/15 0440  HGB 14.4  --   --   --  14.3  HCT 40.3  --   --   --  41.9  PLT 194  --   --   --  205  HEPARINUNFRC  --   --  0.32 0.30 0.19*  CREATININE 0.82 0.85  --   --  0.83  TROPONINI  --  <0.03 <0.03 <0.03  --     Estimated Creatinine Clearance: 78.5 mL/min (by C-G formula based on Cr of 0.83).  Assessment: 80 y.o. male with chest pain, on IV heparin, troponin neg x 2, continue heparin and checking D-dimer for PE r/o. Heparin level 0.19, subtherapeutic on 1050 units/hr. No interruptions with infusion per RN. CBC stable, No bleeding noted.   Goal of Therapy:  Heparin level 0.3-0.7 units/ml Monitor platelets by anticoagulation protocol: Yes   Plan:  Increase heparin rate to 1200 units/hr Check heparin level in 8 hours.  Monitor daily heparin level, CBC   Maryanna Shape, PharmD, BCPS  Clinical Pharmacist  Pager: [phone]   09/18/2015,8:47 AM

## 2015-09-18 NOTE — Progress Notes (Signed)
lexiscan myoview without complications.  Nuc results to follow.

## 2015-09-18 NOTE — Progress Notes (Signed)
Pt profile: 80 year old male admitted 09/16/15 with a history of nonobstructive ASCAD with 20-30% LAD/D1 and OM1/OM2 and 40-50% by cath 2010, right renal artery stenosis, HTN, dyslipidemia, GERD with hiatal hernia who presented with complains of chest pain. His chest discomfort started 09/16/15 and had been constant since that time. He describes it as a pressure sensation. tropoins negative. For lexiscan.  Subjective: Pain free this AM.  Severe pain at 8 pm and new NTG paste applied and sl NTG with relief.   Objective: Vital signs in last 24 hours: Temp:  [97.2 F (36.2 C)-98 F (36.7 C)] 97.7 F (36.5 C) (03/07 0819) Pulse Rate:  [49-52] 52 (03/07 0819) Resp:  [18-20] 18 (03/07 0819) BP: (105-149)/(52-63) 149/63 mmHg (03/07 0819) SpO2:  [95 %-99 %] 98 % (03/07 0819) Weight:  [191 lb 1.6 oz (86.682 kg)] 191 lb 1.6 oz (86.682 kg) (03/07 0622) Weight change: -12.8 oz (-0.363 kg) Last BM Date: 09/16/15 Intake/Output from previous day: +594 03/06 0701 - 03/07 0700 In: 2955.8 [P.O.:1020; I.V.:1885.8; IV Piggyback:50] Out: 2520 [Urine:2520] Intake/Output this shift:    PE: General:Pleasant affect, NAD Skin:Warm and dry, brisk capillary refill HEENT:normocephalic, sclera clear, mucus membranes moist Heart:S1S2 RRR without murmur, gallup, rub or click Lungs:clear without rales, rhonchi, or wheezes [id number], non tender, + BS, do not palpate liver spleen or masses Ext:no lower ext edema, 2+ pedal pulses, 2+ radial pulses Neuro:alert and oriented, MAE, follows commands, + facial symmetry  tele: SB to SR HR freq in the 40s 46  EKG SB at 50 no acute changes  Lab Results:  Recent Labs  09/16/15 2247 09/18/15 0440  WBC 5.2 6.0  HGB 14.4 14.3  HCT 40.3 41.9  PLT 194 205   BMET  Recent Labs  09/17/15 0445 09/18/15 0440  NA 131* 137  K 2.9* 4.4  CL 94* 102  CO2 27 27  GLUCOSE 112* 93  BUN 7 7  CREATININE 0.85 0.83  CALCIUM 8.8* 8.8*    Recent Labs   09/17/15 0933 09/17/15 1623  TROPONINI <0.03 <0.03    Lab Results  Component Value Date   CHOL 140 09/17/2015   HDL 31* 09/17/2015   LDLCALC 77 09/17/2015   LDLDIRECT 93.6 10/28/2012   TRIG 160* 09/17/2015   CHOLHDL 4.5 09/17/2015   Lab Results  Component Value Date   HGBA1C 5.7 10/28/2012     Lab Results  Component Value Date   TSH 2.040 09/17/2015    Hepatic Function Panel  Recent Labs  09/17/15 0445  PROT 5.7*  ALBUMIN 3.4*  AST 23  ALT 16*  ALKPHOS 52  BILITOT 0.5    Recent Labs  09/17/15 0445  CHOL 140   No results for input(s): PROTIME in the last 72 hours.     Studies/Results: Dg Chest Portable 1 View  09/16/2015  CLINICAL DATA:  Bradycardia tonight. EXAM: PORTABLE CHEST 1 VIEW COMPARISON:  06/30/2014 FINDINGS: Shallow inspiration with atelectasis in the lung bases. Normal heart size and pulmonary vascularity. No focal airspace disease or consolidation in the lungs. No blunting of costophrenic angles. No pneumothorax. Mediastinal contours appear intact. Calcification of the aorta. IMPRESSION: Shallow inspiration with atelectasis in the lung bases. Electronically Signed   By: Lucienne Capers M.D.   On: 09/16/2015 23:33    Medications: I have reviewed the patient's current medications. Scheduled Meds: . aspirin EC  81 mg Oral Daily  . atorvastatin  80 mg Oral q1800  . brimonidine  1 drop Both Eyes BID  . citalopram  10 mg Oral q morning - 10a  . clotrimazole  1 application Topical BID  . clotrimazole  10 mg Oral 5 X Daily  . fluticasone  2 spray Each Nare QPM  . gabapentin  1,500 mg Oral QHS  . latanoprost  1 drop Both Eyes QHS  . magnesium oxide  200 mg Oral BID  . nitroGLYCERIN  0.5 inch Topical 4 times per day  . pantoprazole  40 mg Oral BID  . primidone  125 mg Oral TID  . sucralfate  1 g Oral TID WC & HS  . tamsulosin  0.8 mg Oral QHS   Continuous Infusions: . sodium chloride 75 mL/hr at 09/18/15 0645  . heparin 1,050 Units/hr (09/17/15  2248)   PRN Meds:.acetaminophen, alum & mag hydroxide-simeth, nitroGLYCERIN, ondansetron (ZOFRAN) IV  Assessment/Plan: Principal Problem:   Chest pain at rest Active Problems:   Essential hypertension   Bradycardia   Depression   CAD in native artery   CHF (congestive heart failure) (HCC)   Pulmonary hypertension (HCC)   Bladder cancer (HCC)   Essential tremor   Chest pain   Anginal chest pain at rest ([place])  1. Chest pain at rest - With typical and atypical features. Substernal pressure - Lasting > 5 hours. ? Improved with CL nitro. Currently having 5/10 pressure. At peak 8/10.  - EKG non ischemic. BNP 88. No sign of volume overload on exam - Differential include PE given recent surgery vs GI given hx of hiatal hernia. D-Dimer negative. - Will get echocardiogram to assess LV function and WM abnormality has been done but not read. .  NPO after midnight for lexiscan myoview. Will order now.   - BP stable - trial of nitro paste. Continue IV heparin for now. MD to decide.   2. Essential hypertension - Stable and well controlled  3. Bradycardia - Avoid AV blocking agent. No syncope  4. Nonobstructive CAD - Per cath 2008 & 2010.  5. Hyperlipidemia--LDL 77  At home on zocor 60 mg daily, currently on lipitor 80 TG 160, HDL 31     Time spent with pt. : 15 minutes. [facility] R  Nurse Practitioner Certified Pager [phone] or after 5pm and on weekends call [phone] 09/18/2015, 9:04 AM

## 2015-09-18 NOTE — Discharge Summary (Signed)
Physician Discharge Summary  [name] [id number] DOB: Feb 04, 1936 DOA: 09/16/2015  PCP: Cathlean Cower, MD  Admit date: 09/16/2015 Discharge date: 09/18/2015  Time spent: 35 minutes  Recommendations for Outpatient Follow-up:  Please Follow ECHO results.  Might need GI evaluation if chest pain reoccurs.    Discharge Diagnoses:    Chest pain suspect GI origin.    Essential hypertension   Bradycardia   Depression   CAD in native artery   CHF (congestive heart failure) (HCC)   Pulmonary hypertension (HCC)   Bladder cancer (HCC)   Essential tremor   Chest pain   Anginal chest pain at rest [facility])   Discharge Condition: stable  Diet recommendation: Heart Healthy   [facility] Weights   09/17/15 0258 09/18/15 0622  Weight: 87.045 kg (191 lb 14.4 oz) 86.682 kg (191 lb 1.6 oz)    History of present illness:  The patient is a 80 yo M with history of bradycardia, HTN, CAD, possible chronic heart failure and pulmonary hypertension, bladder cancer who presented with acute onset, constant, central sharp and pressured chest pain onset 5 hours prior to admission. Positive tightness in chest, positive bilateral jaw pain, started at rest, positive shortness of breath, negative N/V, negative diaphoresis, plus radiation down left arm. Started at ~1600. Positive continued jaw pain/left shoulder pain . Pt was given 325 mg ASA and 1 nitro tablet en route by EMS to no relief. He did not take anything at home prior to being picked up by EMS. He says that his HR in the ED is slower than usual. He denies appetite change, fever, chills, nausea, vomiting, or diaphoresis. Pt's last stress test was performed in 2008.   Hospital Course:  Chest pain, HEART Score = 6. Jaw pain improved with NTG, but still having some ongoing substernal chest pressure. Dr. Lovena Le patient. Hx of angioplasty. - ECG with stable mild ST segment depressions in inferolateral leads - Troponins neg - A1c pending - LDL 77, HDL 31, TG  160, total cholesterol 140 - Daily aspirin - NTG prn chest pain -discontinue heparin Gtt.  -Myoview low risk, no evidence of ischemia.  -suspect GI related. Increase omeprazole to 40 mg BID, Short course of Carafate.  -D dimer negative,   Essential hypertension - resume Home BP medications, BP increasing.   Bradycardia - avoid AV nodal blocking medications  CHF? -Echocardiogram pending  Pulmonary hypertension -Echocardiogram 11/02/2008 shows borderline pulmonary HTN ECHO pending at time of discharge.   Depression -Continue Celexa 10 mg daily  Essential tremors -Continue primidone 125 mg TID  Bladder cancer (transitional cell carcinoma) s/p resection 06/2015 by Dr. Risa Grill - F/u urology  Hypokalemia and borderline hypomagnesemia - replaced. Will provide oral magnesium at discharge   Procedures:  Myoview; low risk, no evidence of ischemia  Consultations:  Cardiology   Discharge Exam: Filed Vitals:   09/18/15 1259 09/18/15 1300  BP: 166/73   Pulse: 75 76  Temp:    Resp:      General: NAD Cardiovascular: S 1, S 2 RRR Respiratory: CTA  Discharge Instructions   Discharge Instructions    Diet - low sodium heart healthy    Complete by:  As directed      Increase activity slowly    Complete by:  As directed           Current Discharge Medication List    START taking these medications   Details  aspirin EC 81 MG EC tablet Take 1 tablet (81 mg total) by  mouth daily. Qty: 30 tablet, Refills: 0    magnesium oxide (MAG-OX) 400 (241.3 Mg) MG tablet Take 0.5 tablets (200 mg total) by mouth 2 (two) times daily. Qty: 6 tablet, Refills: 0    pantoprazole (PROTONIX) 40 MG tablet Take 1 tablet (40 mg total) by mouth 2 (two) times daily. Qty: 60 tablet, Refills: 0    sucralfate (CARAFATE) 1 GM/10ML suspension Take 10 mLs (1 g total) by mouth 4 (four) times daily -  with meals and at bedtime. Qty: 420 mL, Refills: 0      CONTINUE these medications which  have CHANGED   Details  gabapentin (NEURONTIN) 300 MG capsule Take 5 capsules (1,500 mg total) by mouth at bedtime. TAKES 5 CAPS AT BEDTIME, BUT CAN TAKE UP TO 4 ADDT'L CAPS AS NEEDED FOR NEUROPATHY Qty: 720 capsule, Refills: 3    hydrALAZINE (APRESOLINE) 10 MG tablet Take 1 tablet (10 mg total) by mouth as directed. Takes bid only if systolic over [id number]  Diastolic over 80 Qty: 60 tablet, Refills: 3    primidone (MYSOLINE) 250 MG tablet Take 0.5 tablets (125 mg total) by mouth 3 (three) times daily. TAKE 1/2 TABLET THREE TIMES DAILY Qty: 135 tablet, Refills: 3      CONTINUE these medications which have NOT CHANGED   Details  brimonidine (ALPHAGAN) 0.15 % ophthalmic solution Place 1 drop into both eyes 2 (two) times daily.    cetirizine (ZYRTEC) 10 MG tablet Take 10 mg by mouth at bedtime.     citalopram (CELEXA) 10 MG tablet Take 1 tablet (10 mg total) by mouth every morning. Qty: 90 tablet, Refills: 3    clotrimazole (LOTRIMIN) 1 % cream Apply 1 application topically 2 (two) times daily as needed (FOR RASH). Prescribed for rash in groin from [place] on 06/20/2014    fluticasone (FLONASE) 50 MCG/ACT nasal spray USE 2 SPRAYS IN EACH NOSTRIL EVERY DAY AS NEEDED  FOR  RHINITIS/ALLERGIES Qty: 48 g, Refills: 3    hydrochlorothiazide (HYDRODIURIL) 25 MG tablet Take 25 mg by mouth daily as needed (FOR FLUID).     latanoprost (XALATAN) 0.005 % ophthalmic solution Place 1 drop into both eyes at bedtime.    nitroGLYCERIN (NITROSTAT) 0.4 MG SL tablet Place 1 tablet (0.4 mg total) under the tongue every 5 (five) minutes as needed. Chest pain Qty: 60 tablet, Refills: 5    oxyCODONE-acetaminophen (PERCOCET/ROXICET) 5-325 MG tablet Take 1 tablet by mouth every 4 (four) hours as needed for severe pain.    simvastatin (ZOCOR) 40 MG tablet TAKE 1 AND 1/2 TABLETS AT BEDTIME Qty: 135 tablet, Refills: 3    tamsulosin (FLOMAX) 0.4 MG CAPS capsule Take 1 capsule (0.4 mg total) by mouth daily. Qty: 90  capsule, Refills: 3    timolol (TIMOPTIC) 0.5 % ophthalmic solution Place 1 drop into both eyes 2 (two) times daily.  Refills: 1    Travoprost, BAK Free, (TRAVATAN Z) 0.004 % SOLN ophthalmic solution Place 1 drop into both eyes at bedtime. Qty: 5 mL, Refills: 5    amLODipine (NORVASC) 5 MG tablet Take 5 mg by mouth daily. Reported on 09/16/2015    clotrimazole-betamethasone (LOTRISONE) cream Use as directed twice per day as needed Qty: 15 g, Refills: 1    HYDROcodone-homatropine (HYCODAN) 5-1.5 MG/5ML syrup Take 5 mLs by mouth every 6 (six) hours as needed for cough. Qty: 180 mL, Refills: 0    losartan (COZAAR) 50 MG tablet Take 50 mg by mouth daily. Reported on 09/16/2015  potassium chloride (K-DUR,KLOR-CON) 10 MEQ tablet Take 1 tab by mouth per day Qty: 90 tablet, Refills: 3      STOP taking these medications     diphenoxylate-atropine (LOMOTIL) 2.5-0.025 MG tablet      omeprazole (PRILOSEC) 20 MG capsule      potassium chloride (K-DUR) 10 MEQ tablet      docusate sodium (COLACE) 100 MG capsule      meclizine (ANTIVERT) 25 MG tablet        Allergies  Allergen Reactions  . Morphine And Related Other (See Comments)    Migraine   . Hydrochlorothiazide Other (See Comments)    REACTION: hyponatremia with daily use  . Tape Itching    Patient  Is ok to use paper tape  . Lisinopril Cough  . Sulfa Antibiotics Rash   Follow-up Information    Follow up with Cathlean Cower, MD In 1 week.   Specialties:  Internal Medicine, Radiology   Contact information:   [place] [place] [address] [phone]        The results of significant diagnostics from this hospitalization (including imaging, microbiology, ancillary and laboratory) are listed below for reference.    Significant Diagnostic Studies: Nm Myocar Multi W/spect W/wall Motion / Ef  09/18/2015   There was no ST segment deviation noted during stress.  Defect 1: There is a medium defect of mild severity  present in the basal inferior and mid inferior location. Likely secondary to diaphragmatic attenuation.  This is a low risk study. No ischemia.  Nuclear stress EF: 60%. No wall motion abnormality  Candee Furbish, MD   Dg Chest Portable 1 View  09/16/2015  CLINICAL DATA:  Bradycardia tonight. EXAM: PORTABLE CHEST 1 VIEW COMPARISON:  06/30/2014 FINDINGS: Shallow inspiration with atelectasis in the lung bases. Normal heart size and pulmonary vascularity. No focal airspace disease or consolidation in the lungs. No blunting of costophrenic angles. No pneumothorax. Mediastinal contours appear intact. Calcification of the aorta. IMPRESSION: Shallow inspiration with atelectasis in the lung bases. Electronically Signed   By: Lucienne Capers M.D.   On: 09/16/2015 23:33    Microbiology: No results found for this or any previous visit (from the past 240 hour(s)).   Labs: Basic Metabolic Panel:  Recent Labs Lab 09/12/15 1547 09/16/15 2247 09/17/15 0445 09/18/15 0440  NA 130* 131* 131* 137  K 3.4* 3.9 2.9* 4.4  CL 93* 93* 94* 102  CO2 32 26 27 27   GLUCOSE 97 86 112* 93  BUN 8 10 7 7   CREATININE 0.82 0.82 0.85 0.83  CALCIUM 9.3 9.1 8.8* 8.8*  MG  --   --  1.7 2.1   Liver Function Tests:  Recent Labs Lab 09/17/15 0445  AST 23  ALT 16*  ALKPHOS 52  BILITOT 0.5  PROT 5.7*  ALBUMIN 3.4*   No results for input(s): LIPASE, AMYLASE in the last 168 hours. No results for input(s): AMMONIA in the last 168 hours. CBC:  Recent Labs Lab 09/16/15 2247 09/18/15 0440  WBC 5.2 6.0  HGB 14.4 14.3  HCT 40.3 41.9  MCV 87.8 88.8  PLT 194 205   Cardiac Enzymes:  Recent Labs Lab 09/17/15 0445 09/17/15 0933 09/17/15 1623 09/18/15 0950  TROPONINI <0.03 <0.03 <0.03 <0.03   BNP: BNP (last 3 results)  Recent Labs  09/17/15 0445  BNP 88.4    ProBNP (last 3 results) No results for input(s): PROBNP in the last 8760 hours.  CBG:  Recent Labs  Lab 09/18/15 1114  GLUCAP 100*        Signed:  Niel Hummer A MD.  [facility] 09/18/2015, 3:35 PM

## 2015-09-18 NOTE — Progress Notes (Signed)
  Echocardiogram 2D Echocardiogram has been performed.  [name] 09/18/2015, 3:33 PM

## 2015-09-20 ENCOUNTER — Telehealth: Payer: Self-pay | Admitting: *Deleted

## 2015-09-20 NOTE — Telephone Encounter (Signed)
Transition Care Management Follow-up Telephone Call   Date discharged? 09/18/15   How have you been since you were released from the hospital? Pt states he is feeling alright   Do you understand why you were in the hospital? YES   Do you understand the discharge instructions? YES   Where were you discharged to? Home   Items Reviewed:  Medications reviewed: YES  Allergies reviewed: YES  Dietary changes reviewed: YES  Referrals reviewed: no referral needed   Functional Questionnaire:   Activities of Daily Living (ADLs):   He states he are independent in the following: ambulation, bathing and hygiene, feeding, continence, grooming, toileting and dressing States he doesn't require assistance    Any transportation issues/concerns?: NO   Any patient concerns? YES   Confirmed importance and date/time of follow-up visits scheduled YES, appt 09/25/15  Provider Appointment booked with  Confirmed with patient if condition begins to worsen call PCP or go to the ER.  Patient was given the office number and encouraged to call back with question or concerns.  : YES

## 2015-09-25 ENCOUNTER — Ambulatory Visit: Payer: Medicare Other | Admitting: Internal Medicine

## 2015-09-27 ENCOUNTER — Ambulatory Visit (INDEPENDENT_AMBULATORY_CARE_PROVIDER_SITE_OTHER): Payer: Medicare Other | Admitting: Internal Medicine

## 2015-09-27 ENCOUNTER — Encounter: Payer: Self-pay | Admitting: Internal Medicine

## 2015-09-27 VITALS — BP 138/82 | HR 53 | Temp 98.4°F | Resp 20 | Wt 195.0 lb

## 2015-09-27 DIAGNOSIS — K219 Gastro-esophageal reflux disease without esophagitis: Secondary | ICD-10-CM | POA: Diagnosis not present

## 2015-09-27 DIAGNOSIS — I10 Essential (primary) hypertension: Secondary | ICD-10-CM | POA: Diagnosis not present

## 2015-09-27 DIAGNOSIS — E876 Hypokalemia: Secondary | ICD-10-CM

## 2015-09-27 DIAGNOSIS — R079 Chest pain, unspecified: Secondary | ICD-10-CM | POA: Diagnosis not present

## 2015-09-27 MED ORDER — PANTOPRAZOLE SODIUM 40 MG PO TBEC: 40.0000 mg | DELAYED_RELEASE_TABLET | Freq: Two times a day (BID) | ORAL | Status: DC

## 2015-09-27 MED ORDER — POTASSIUM CHLORIDE CRYS ER 10 MEQ PO TBCR: EXTENDED_RELEASE_TABLET | ORAL | Status: DC

## 2015-09-27 NOTE — Patient Instructions (Addendum)
Ok to increase the potassium pill to 2 per day  Ok to stop the omprazole 20 mg  Please take all new medication as prescribed - protonix at 40 mg twice per day  You will be contacted regarding the referral for: GI   Please continue all other medications as before, and refills have been done if requested.  Please have the pharmacy call with any other refills you may need.  Please continue your efforts at being more active, low cholesterol diet, and weight control.  You are otherwise up to date with prevention measures today.  Please keep your appointments with your specialists as you may have planned  No further lab work needed today  Please return in 3 months, or sooner if needed

## 2015-09-27 NOTE — Progress Notes (Signed)
Pre visit review using our clinic review tool, if applicable. No additional management support is needed unless otherwise documented below in the visit note. 

## 2015-09-27 NOTE — Progress Notes (Signed)
Subjective:    Patient ID: [name], male    DOB: 1936/04/01, 80 y.o.   MRN: [id number]  HPI  Here post hospn mar 5-7 for chest pain, likely noncardiac wqit hlow risk stress test, PPI increased to 40 bid, but pt does not want to do this, feels more comfortable with omprazole  20 bid. Pt asks to see GI for ? Need endocscopy, last flex sig neg with Dr Sharlett Iles in 2010.  Has had some chest discomfort and pressure persists since then, but Denies worsening reflux, dysphagia, n/v, bowel change or blood.  BP severely elevated at time of hospn and initial pain, now improved.  K low as well at admit BP Readings from Last 3 Encounters:  09/27/15 138/82  09/18/15 166/73  07/13/15 140/76  Echo done for ? CHF but EF is normal. Only taking the K at one per day, despite needing parenteral K twice in the past few months for low K..Does c/o ongoing fatigue, but denies signficant daytime hypersomnolence. Is having to care for wife more invalid and impaired recently.  Pt denies increased sob or doe, wheezing, orthopnea, PND, increased LE swelling, palpitations, dizziness or syncope.   Pt denies fever, wt loss, night sweats, loss of appetite, or other constitutional symptoms Past Medical History  Diagnosis Date  . Diverticulosis of colon (without mention of hemorrhage)   . Bradycardia   . Bladder cancer ([place])   . Allergic rhinitis   . CAD (coronary artery disease)   . CHF (congestive heart failure) ([place])   . Rheumatoid arthritis(714.0)   . HTN (hypertension)   . HLD (hyperlipidemia)   . GERD (gastroesophageal reflux disease)   . Depression   . History of colon polyps   . Glaucoma   . Fatty liver disease, nonalcoholic   . DDD (degenerative disc disease), lumbosacral   . History of peptic ulcer   . History of hiatal hernia   . Right renal artery stenosis ([facility])     per cath 03-14-2009 --- 40-50%  . RLS (restless legs syndrome)   . History of bladder cancer     2004--  TCC low-grade , non-invasive  .  History of kidney stones   . BPH (benign prostatic hyperplasia)   . Weak urinary stream   . Nocturia   . History of exercise stress test     02-05-2007-- ETT (Clinically positive, electrically equivocal, submaximal ETT,  appropriate bp response to exercise  . Essential tremor   . Chronic low back pain   . Wears glasses   . Wears dentures     upper  . Wears partial dentures     lower  . Wears hearing aid     bilateral  . Vertigo, intermittent   . Productive cough   . Acute bronchitis per pt no fever--  cough since discharge from hospital 07-02-2015    per pcp note 07-05-2015  mild to moderate bronchitis vs pna   Past Surgical History  Procedure Laterality Date  . Lumbar disc surgery  07-16-2005  . Lumbar laminectomy/decompression microdiscectomy  07/09/2011    Procedure: LUMBAR LAMINECTOMY/DECOMPRESSION MICRODISCECTOMY;  Surgeon: Johnn Hai;  Location: [facility];  Service: Orthopedics;  Laterality: Left;  Decompression L5 - S1 on the Left and repair of dura  . Lumbar laminectomy/decompression microdiscectomy N/A 07/27/2013    Procedure: DECOMPRESSION L4 - L5 WITH EXCISION OF SYNOVIAL CYST AND, LATERAL MASS FUSION 1 LEVEL;  Surgeon: Johnn Hai, MD;  Location: [facility];  Service: Orthopedics;  Laterality: N/A;  . Lumbar laminectomy/decompression microdiscectomy Left 07/06/2014    Procedure:  MICRO LUMBAR DECOMPRESSION L5-S1 ON LEFT, L4-5 REDO;  Surgeon: Johnn Hai, MD;  Location: [facility];  Service: Orthopedics;  Laterality: Left;  . Cardiac catheterization  02-12-2007  dr gregg taylor    Non-obstructive CAD,  20% pLCX,  20% LAD, ef 65%  . Cardiac catheterization  03-14-2009  dr Johnsie Cancel    pLAD and mid diaginal 20-30% multiple lesions,  OM1 20%, right renal ostial 40-50%,  ef 60%  . Transurethral resection of bladder tumor  09-21-2002  . Tonsillectomy and adenoidectomy    . Hip arthroscopy w/ labral debridement Left 06-29-2000    and chondraplasty  . Transthoracic  echocardiogram  09-20-2010    normal LVF, ef 55-65%/  mild MR, TR and PR/  mild LAE  . Cataract extraction w/ intraocular lens  implant, bilateral  2016  . Cystoscopy with biopsy N/A 07/11/2015    Procedure: CYSTOSCOPY WITH COLD CUP RESECTION AND FULGERATION;  Surgeon: Rana Snare, MD;  Location: [facility];  Service: Urology;  Laterality: N/A;    reports that he quit smoking about 32 years ago. His smoking use included Cigarettes. He has a 40 pack-year smoking history. He has never used smokeless tobacco. He reports that he does not drink alcohol or use illicit drugs. family history includes Cancer in his brother, father, and sister; Diabetes in his sister; Heart attack in his mother. Allergies  Allergen Reactions  . Morphine And Related Other (See Comments)    Migraine   . Hydrochlorothiazide Other (See Comments)    REACTION: hyponatremia with daily use  . Tape Itching    Patient  Is ok to use paper tape  . Lisinopril Cough  . Sulfa Antibiotics Rash   Current Outpatient Prescriptions on File Prior to Visit  Medication Sig Dispense Refill  . amLODipine (NORVASC) 5 MG tablet Take 5 mg by mouth daily. Reported on 09/16/2015    . aspirin EC 81 MG EC tablet Take 1 tablet (81 mg total) by mouth daily. 30 tablet 0  . brimonidine (ALPHAGAN) 0.15 % ophthalmic solution Place 1 drop into both eyes 2 (two) times daily.    . cetirizine (ZYRTEC) 10 MG tablet Take 10 mg by mouth at bedtime.     . citalopram (CELEXA) 10 MG tablet Take 1 tablet (10 mg total) by mouth every morning. 90 tablet 3  . clotrimazole (LOTRIMIN) 1 % cream Apply 1 application topically 2 (two) times daily as needed (FOR RASH). Prescribed for rash in groin from [place] on 06/20/2014    . clotrimazole-betamethasone (LOTRISONE) cream Use as directed twice per day as needed (Patient taking differently: Apply 1 application topically 2 (two) times daily. ) 15 g 1  . fluticasone (FLONASE) 50 MCG/ACT nasal spray USE 2 SPRAYS IN  EACH NOSTRIL EVERY DAY AS NEEDED  FOR  RHINITIS/ALLERGIES (Patient taking differently: Place 2 sprays into both nostrils every evening. ) 48 g 3  . gabapentin (NEURONTIN) 300 MG capsule Take 5 capsules (1,500 mg total) by mouth at bedtime. TAKES 5 CAPS AT BEDTIME, BUT CAN TAKE UP TO 4 ADDT'L CAPS AS NEEDED FOR NEUROPATHY 720 capsule 3  . hydrALAZINE (APRESOLINE) 10 MG tablet Take 1 tablet (10 mg total) by mouth as directed. Takes bid only if systolic over 99991111  Diastolic over 80 60 tablet 3  . hydrochlorothiazide (HYDRODIURIL) 25 MG tablet Take 25 mg by mouth daily as needed (FOR FLUID).     Marland Kitchen  HYDROcodone-homatropine (HYCODAN) 5-1.5 MG/5ML syrup Take 5 mLs by mouth every 6 (six) hours as needed for cough. 180 mL 0  . latanoprost (XALATAN) 0.005 % ophthalmic solution Place 1 drop into both eyes at bedtime.    Marland Kitchen losartan (COZAAR) 50 MG tablet Take 50 mg by mouth daily. Reported on 09/16/2015    . magnesium oxide (MAG-OX) 400 (241.3 Mg) MG tablet Take 0.5 tablets (200 mg total) by mouth 2 (two) times daily. 6 tablet 0  . nitroGLYCERIN (NITROSTAT) 0.4 MG SL tablet Place 1 tablet (0.4 mg total) under the tongue every 5 (five) minutes as needed. Chest pain 60 tablet 5  . oxyCODONE-acetaminophen (PERCOCET/ROXICET) 5-325 MG tablet Take 1 tablet by mouth every 4 (four) hours as needed for severe pain.    . primidone (MYSOLINE) 250 MG tablet Take 0.5 tablets (125 mg total) by mouth 3 (three) times daily. TAKE 1/2 TABLET THREE TIMES DAILY 135 tablet 3  . simvastatin (ZOCOR) 40 MG tablet TAKE 1 AND 1/2 TABLETS AT BEDTIME 135 tablet 3  . sucralfate (CARAFATE) 1 GM/10ML suspension Take 10 mLs (1 g total) by mouth 4 (four) times daily -  with meals and at bedtime. 420 mL 0  . tamsulosin (FLOMAX) 0.4 MG CAPS capsule Take 1 capsule (0.4 mg total) by mouth daily. (Patient taking differently: Take 0.8 mg by mouth at bedtime. ) 90 capsule 3  . timolol (TIMOPTIC) 0.5 % ophthalmic solution Place 1 drop into both eyes 2 (two)  times daily.   1  . Travoprost, BAK Free, (TRAVATAN Z) 0.004 % SOLN ophthalmic solution Place 1 drop into both eyes at bedtime. 5 mL 5   No current facility-administered medications on file prior to visit.   Review of Systems  Constitutional: Negative for unusual diaphoresis or night sweats HENT: Negative for ringing in ear or discharge Eyes: Negative for double vision or worsening visual disturbance.  Respiratory: Negative for choking and stridor.   Gastrointestinal: Negative for vomiting or other signifcant bowel change Genitourinary: Negative for hematuria or change in urine volume.  Musculoskeletal: Negative for other MSK pain or swelling Skin: Negative for color change and worsening wound.  Neurological: Negative for tremors and numbness other than noted  Psychiatric/Behavioral: Negative for decreased concentration or agitation other than above       Objective:   Physical Exam BP 138/82 mmHg  Pulse 53  Temp(Src) 98.4 F (36.9 C) (Oral)  Resp 20  Wt 195 lb (88.451 kg)  SpO2 95% VS noted, non toxic Constitutional: Pt appears in no significant distress HENT: Head: NCAT.  Right Ear: External ear normal.  Left Ear: External ear normal.  Eyes: . Pupils are equal, round, and reactive to light. Conjunctivae and EOM are normal Neck: Normal range of motion. Neck supple.  Cardiovascular: Normal rate and regular rhythm.   Pulmonary/Chest: Effort normal and breath sounds mild decreased without rales or wheezing.  Abd:  Soft, NT, ND, + BS Neurological: Pt is alert. Not confused , motor grossly intact Skin: Skin is warm. No rash, no LE edema Psychiatric: Pt behavior is normal. No agitation.    Derrek Cavness Lamaster  ECHO COMPLETE WO IMAGE ENHANCING AGENT  Order# [id number]   Reading physician: Thayer Headings, MD  Ordering physician: Allie Bossier, MD  Study date: 09/18/15      Study Result    Result status: Final result     *[facility]*  [address]  [place], [place] [address]  [id number]  ------------------------------------------------------------------- Transthoracic Echocardiography  Patient: Casey, [name] MR #: [id number] Study Date: 09/18/2015 Gender: M Age: 89 Height: 170.2 cm Weight: 86.6 kg BSA: 2.05 m^2 Pt. Status: Room: 3E06C  ATTENDING Regalado, Belkys A ORDERING Allie Bossier PERFORMING [facility], Inpatient SONOGRAPHER Mikki Santee ADMITTING Mitzi Hansen S  cc:  ------------------------------------------------------------------- LV EF: 55% - 60%  ------------------------------------------------------------------- Indications: Chest pain 786.51.  ------------------------------------------------------------------- History: PMH: Coronary artery disease. Congestive heart failure. Risk factors: Former tobacco use. Hypertension. Dyslipidemia.  ------------------------------------------------------------------- Study Conclusions  - Left ventricle: The cavity size was normal. Wall thickness was  normal. Systolic function was normal. The estimated ejection  fraction was in the range of 55% to 60%. Wall motion was normal;  there were no regional wall motion abnormalities. - Left atrium: The atrium was mildly dilated.       Assessment & Plan:

## 2015-09-27 NOTE — Assessment & Plan Note (Signed)
Severe with onset pain, now improved and stable overall by history and exam, recent data reviewed with pt, and pt to continue medical treatment as before,  to f/u any worsening symptoms or concerns BP Readings from Last 3 Encounters:  09/27/15 138/82  09/18/15 166/73  07/13/15 140/76

## 2015-09-27 NOTE — Assessment & Plan Note (Signed)
Atypical , non cardiac, suspect GI related vs other - for increased PPI as above, no further testing o/w at this time,  to f/u any worsening symptoms or concerns

## 2015-09-27 NOTE — Assessment & Plan Note (Signed)
Pt reassured, ok to take the protonix 40 bid for now, also refer GI - ? Need endoscopy, and/or routine colon screen as well,  to f/u any worsening symptoms or concerns

## 2015-09-27 NOTE — Assessment & Plan Note (Signed)
Mod, recurrent twice recently - pt reassured, ok to incresae the K to 2 per day as rec'd before, with plan for f/u lab at next visit

## 2015-10-02 DIAGNOSIS — M961 Postlaminectomy syndrome, not elsewhere classified: Secondary | ICD-10-CM | POA: Diagnosis not present

## 2015-10-02 DIAGNOSIS — M5136 Other intervertebral disc degeneration, lumbar region: Secondary | ICD-10-CM | POA: Diagnosis not present

## 2015-10-02 DIAGNOSIS — M1288 Other specific arthropathies, not elsewhere classified, other specified site: Secondary | ICD-10-CM | POA: Diagnosis not present

## 2015-10-05 ENCOUNTER — Encounter: Payer: Self-pay | Admitting: Internal Medicine

## 2015-10-05 DIAGNOSIS — M47816 Spondylosis without myelopathy or radiculopathy, lumbar region: Secondary | ICD-10-CM | POA: Diagnosis not present

## 2015-10-05 DIAGNOSIS — M961 Postlaminectomy syndrome, not elsewhere classified: Secondary | ICD-10-CM | POA: Diagnosis not present

## 2015-10-17 ENCOUNTER — Ambulatory Visit (INDEPENDENT_AMBULATORY_CARE_PROVIDER_SITE_OTHER): Payer: Medicare Other | Admitting: Internal Medicine

## 2015-10-17 ENCOUNTER — Telehealth: Payer: Self-pay

## 2015-10-17 ENCOUNTER — Encounter: Payer: Self-pay | Admitting: Internal Medicine

## 2015-10-17 ENCOUNTER — Ambulatory Visit (INDEPENDENT_AMBULATORY_CARE_PROVIDER_SITE_OTHER)
Admission: RE | Admit: 2015-10-17 | Discharge: 2015-10-17 | Disposition: A | Discharge status: Home / Self Care | Payer: Medicare Other | Source: Ambulatory Visit | Attending: Internal Medicine | Admitting: Internal Medicine

## 2015-10-17 VITALS — BP 122/78 | HR 54 | Temp 98.9°F | Resp 20 | Wt 198.0 lb

## 2015-10-17 DIAGNOSIS — R509 Fever, unspecified: Secondary | ICD-10-CM | POA: Diagnosis not present

## 2015-10-17 DIAGNOSIS — R05 Cough: Secondary | ICD-10-CM

## 2015-10-17 DIAGNOSIS — R0600 Dyspnea, unspecified: Secondary | ICD-10-CM

## 2015-10-17 DIAGNOSIS — I10 Essential (primary) hypertension: Secondary | ICD-10-CM

## 2015-10-17 DIAGNOSIS — R059 Cough, unspecified: Secondary | ICD-10-CM

## 2015-10-17 DIAGNOSIS — B001 Herpesviral vesicular dermatitis: Secondary | ICD-10-CM

## 2015-10-17 MED ORDER — HYDROCODONE-HOMATROPINE 5-1.5 MG/5ML PO SYRP: 5.0000 mL | ORAL_SOLUTION | Freq: Four times a day (QID) | ORAL | Status: DC | PRN

## 2015-10-17 MED ORDER — POTASSIUM CHLORIDE CRYS ER 10 MEQ PO TBCR: EXTENDED_RELEASE_TABLET | ORAL | Status: DC

## 2015-10-17 MED ORDER — LEVOFLOXACIN 250 MG PO TABS: 250.0000 mg | ORAL_TABLET | Freq: Every day | ORAL | Status: DC

## 2015-10-17 MED ORDER — ACYCLOVIR 5 % EX CREA: 1.0000 "application " | TOPICAL_CREAM | CUTANEOUS | Status: DC

## 2015-10-17 NOTE — Telephone Encounter (Signed)
PA initiated via CoverMyMeds key [id number]

## 2015-10-17 NOTE — Progress Notes (Signed)
Pre visit review using our clinic review tool, if applicable. No additional management support is needed unless otherwise documented below in the visit note. 

## 2015-10-17 NOTE — Patient Instructions (Signed)
Please take all new medication as prescribed - the cream, antibiotic pills, and cough medicine if needed  Please continue all other medications as before, and refills have been done if requested.  Please have the pharmacy call with any other refills you may need.  Please keep your appointments with your specialists as you may have planned  Please go to the XRAY Department in the Basement (go straight as you get off the elevator) for the x-ray testing  You will be contacted by phone if any changes need to be made immediately.  Otherwise, you will receive a letter about your results with an explanation, but please check with MyChart first.  Please remember to sign up for MyChart if you have not done so, as this will be important to you in the future with finding out test results, communicating by private email, and scheduling acute appointments online when needed.

## 2015-10-17 NOTE — Progress Notes (Signed)
Subjective:    Patient ID: [name], male    DOB: 11-19-1935, 80 y.o.   MRN: [id number]  HPI  Here with acute onset mild to mod 2-3 days ST, HA, general weakness and malaise, with prod cough greenish sputum, but Pt denies chest pain, increased sob or doe, wheezing, orthopnea, PND, increased LE swelling, palpitations, dizziness or syncope, except for onset mild sob, ? Wheezing last PM.  Wife has been ill, seen and tx here recently, now improved with antibx, steroids, cough med.  Pt denies new neurological symptoms such as new headache, or facial or extremity weakness or numbness   Pt denies polydipsia, polyuria, Also with large area right upper lip fever blisters, painful, asks for cream Past Medical History  Diagnosis Date  . Diverticulosis of colon (without mention of hemorrhage)   . Bradycardia   . Bladder cancer ([place])   . Allergic rhinitis   . CAD (coronary artery disease)   . CHF (congestive heart failure) ([place])   . Rheumatoid arthritis(714.0)   . HTN (hypertension)   . HLD (hyperlipidemia)   . GERD (gastroesophageal reflux disease)   . Depression   . History of colon polyps   . Glaucoma   . Fatty liver disease, nonalcoholic   . DDD (degenerative disc disease), lumbosacral   . History of peptic ulcer   . History of hiatal hernia   . Right renal artery stenosis ([facility])     per cath 03-14-2009 --- 40-50%  . RLS (restless legs syndrome)   . History of bladder cancer     2004--  TCC low-grade , non-invasive  . History of kidney stones   . BPH (benign prostatic hyperplasia)   . Weak urinary stream   . Nocturia   . History of exercise stress test     02-05-2007-- ETT (Clinically positive, electrically equivocal, submaximal ETT,  appropriate bp response to exercise  . Essential tremor   . Chronic low back pain   . Wears glasses   . Wears dentures     upper  . Wears partial dentures     lower  . Wears hearing aid     bilateral  . Vertigo, intermittent   . Productive cough    . Acute bronchitis per pt no fever--  cough since discharge from hospital 07-02-2015    per pcp note 07-05-2015  mild to moderate bronchitis vs pna   Past Surgical History  Procedure Laterality Date  . Lumbar disc surgery  07-16-2005  . Lumbar laminectomy/decompression microdiscectomy  07/09/2011    Procedure: LUMBAR LAMINECTOMY/DECOMPRESSION MICRODISCECTOMY;  Surgeon: Johnn Hai;  Location: [facility];  Service: Orthopedics;  Laterality: Left;  Decompression L5 - S1 on the Left and repair of dura  . Lumbar laminectomy/decompression microdiscectomy N/A 07/27/2013    Procedure: DECOMPRESSION L4 - L5 WITH EXCISION OF SYNOVIAL CYST AND, LATERAL MASS FUSION 1 LEVEL;  Surgeon: Johnn Hai, MD;  Location: [facility];  Service: Orthopedics;  Laterality: N/A;  . Lumbar laminectomy/decompression microdiscectomy Left 07/06/2014    Procedure:  MICRO LUMBAR DECOMPRESSION L5-S1 ON LEFT, L4-5 REDO;  Surgeon: Johnn Hai, MD;  Location: [facility];  Service: Orthopedics;  Laterality: Left;  . Cardiac catheterization  02-12-2007  dr gregg taylor    Non-obstructive CAD,  20% pLCX,  20% LAD, ef 65%  . Cardiac catheterization  03-14-2009  dr Johnsie Cancel    pLAD and mid diaginal 20-30% multiple lesions,  OM1 20%, right renal ostial 40-50%,  ef 60%  .  Transurethral resection of bladder tumor  09-21-2002  . Tonsillectomy and adenoidectomy    . Hip arthroscopy w/ labral debridement Left 06-29-2000    and chondraplasty  . Transthoracic echocardiogram  09-20-2010    normal LVF, ef 55-65%/  mild MR, TR and PR/  mild LAE  . Cataract extraction w/ intraocular lens  implant, bilateral  2016  . Cystoscopy with biopsy N/A 07/11/2015    Procedure: CYSTOSCOPY WITH COLD CUP RESECTION AND FULGERATION;  Surgeon: Rana Snare, MD;  Location: [facility];  Service: Urology;  Laterality: N/A;    reports that he quit smoking about 33 years ago. His smoking use included Cigarettes. He has a 40 pack-year smoking  history. He has never used smokeless tobacco. He reports that he does not drink alcohol or use illicit drugs. family history includes Cancer in his brother, father, and sister; Diabetes in his sister; Heart attack in his mother. Allergies  Allergen Reactions  . Morphine And Related Other (See Comments)    Migraine   . Hydrochlorothiazide Other (See Comments)    REACTION: hyponatremia with daily use  . Tape Itching    Patient  Is ok to use paper tape  . Lisinopril Cough  . Sulfa Antibiotics Rash   Current Outpatient Prescriptions on File Prior to Visit  Medication Sig Dispense Refill  . amLODipine (NORVASC) 5 MG tablet Take 5 mg by mouth daily. Reported on 09/16/2015    . aspirin EC 81 MG EC tablet Take 1 tablet (81 mg total) by mouth daily. 30 tablet 0  . brimonidine (ALPHAGAN) 0.15 % ophthalmic solution Place 1 drop into both eyes 2 (two) times daily.    . cetirizine (ZYRTEC) 10 MG tablet Take 10 mg by mouth at bedtime.     . citalopram (CELEXA) 10 MG tablet Take 1 tablet (10 mg total) by mouth every morning. 90 tablet 3  . clotrimazole (LOTRIMIN) 1 % cream Apply 1 application topically 2 (two) times daily as needed (FOR RASH). Prescribed for rash in groin from [place] on 06/20/2014    . clotrimazole-betamethasone (LOTRISONE) cream Use as directed twice per day as needed (Patient taking differently: Apply 1 application topically 2 (two) times daily. ) 15 g 1  . fluticasone (FLONASE) 50 MCG/ACT nasal spray USE 2 SPRAYS IN EACH NOSTRIL EVERY DAY AS NEEDED  FOR  RHINITIS/ALLERGIES (Patient taking differently: Place 2 sprays into both nostrils every evening. ) 48 g 3  . gabapentin (NEURONTIN) 300 MG capsule Take 5 capsules (1,500 mg total) by mouth at bedtime. TAKES 5 CAPS AT BEDTIME, BUT CAN TAKE UP TO 4 ADDT'L CAPS AS NEEDED FOR NEUROPATHY 720 capsule 3  . hydrALAZINE (APRESOLINE) 10 MG tablet Take 1 tablet (10 mg total) by mouth as directed. Takes bid only if systolic over 99991111  Diastolic over 80 60  tablet 3  . hydrochlorothiazide (HYDRODIURIL) 25 MG tablet Take 25 mg by mouth daily as needed (FOR FLUID).     Marland Kitchen HYDROcodone-homatropine (HYCODAN) 5-1.5 MG/5ML syrup Take 5 mLs by mouth every 6 (six) hours as needed for cough. 180 mL 0  . latanoprost (XALATAN) 0.005 % ophthalmic solution Place 1 drop into both eyes at bedtime.    Marland Kitchen losartan (COZAAR) 50 MG tablet Take 50 mg by mouth daily. Reported on 09/16/2015    . magnesium oxide (MAG-OX) 400 (241.3 Mg) MG tablet Take 0.5 tablets (200 mg total) by mouth 2 (two) times daily. 6 tablet 0  . nitroGLYCERIN (NITROSTAT) 0.4 MG SL tablet  Place 1 tablet (0.4 mg total) under the tongue every 5 (five) minutes as needed. Chest pain 60 tablet 5  . oxyCODONE-acetaminophen (PERCOCET/ROXICET) 5-325 MG tablet Take 1 tablet by mouth every 4 (four) hours as needed for severe pain.    . pantoprazole (PROTONIX) 40 MG tablet Take 1 tablet (40 mg total) by mouth 2 (two) times daily. 180 tablet 3  . primidone (MYSOLINE) 250 MG tablet Take 0.5 tablets (125 mg total) by mouth 3 (three) times daily. TAKE 1/2 TABLET THREE TIMES DAILY 135 tablet 3  . simvastatin (ZOCOR) 40 MG tablet TAKE 1 AND 1/2 TABLETS AT BEDTIME 135 tablet 3  . sucralfate (CARAFATE) 1 GM/10ML suspension Take 10 mLs (1 g total) by mouth 4 (four) times daily -  with meals and at bedtime. 420 mL 0  . tamsulosin (FLOMAX) 0.4 MG CAPS capsule Take 1 capsule (0.4 mg total) by mouth daily. (Patient taking differently: Take 0.8 mg by mouth at bedtime. ) 90 capsule 3  . timolol (TIMOPTIC) 0.5 % ophthalmic solution Place 1 drop into both eyes 2 (two) times daily.   1  . Travoprost, BAK Free, (TRAVATAN Z) 0.004 % SOLN ophthalmic solution Place 1 drop into both eyes at bedtime. 5 mL 5   No current facility-administered medications on file prior to visit.    Review of Systems  Constitutional: Negative for unusual diaphoresis or night sweats HENT: Negative for ear swelling or discharge Eyes: Negative for worsening  visual haziness  Respiratory: Negative for choking and stridor.   Gastrointestinal: Negative for distension or worsening eructation Genitourinary: Negative for retention or change in urine volume.  Musculoskeletal: Negative for other MSK pain or swelling Skin: Negative for color change and worsening wound Neurological: Negative for tremors and numbness other than noted  Psychiatric/Behavioral: Negative for decreased concentration or agitation other than above       Objective:   Physical Exam BP 122/78 mmHg  Pulse 54  Temp(Src) 98.9 F (37.2 C) (Oral)  Resp 20  Wt 198 lb (89.812 kg)  SpO2 93% VS noted, mild ill Constitutional: Pt appears in no apparent distress HENT: Head: NCAT.  Right Ear: External ear normal.  Left Ear: External ear normal. Bilat tm's with mild erythema.  Max sinus areas non tender.  Pharynx with mild erythema, no exudate  Eyes: . Pupils are equal, round, and reactive to light. Conjunctivae and EOM are normal Neck: Normal range of motion. Neck supple.  Cardiovascular: Normal rate and regular rhythm.   Pulmonary/Chest: Effort normal and breath sounds with bibasilar rales ? Dry, but no wheezing.  Neurological: Pt is alert. Not confused , motor grossly intact Skin: Skin is warm. No rash, no LE edema, + fever blister tender right upper lip Psychiatric: Pt behavior is normal. No agitation.     Assessment & Plan:

## 2015-10-18 DIAGNOSIS — B001 Herpesviral vesicular dermatitis: Secondary | ICD-10-CM | POA: Insufficient documentation

## 2015-10-18 NOTE — Assessment & Plan Note (Signed)
Mild to mod, for antibx course - topical acyclovir,,  to f/u any worsening symptoms or concerns

## 2015-10-18 NOTE — Telephone Encounter (Signed)
PA for Zovirax DENIED, please advise on alternative medication. Thanks!

## 2015-10-18 NOTE — Assessment & Plan Note (Signed)
Mild to mod, c/w bronchitis vs pna, for cxr, for antibx course, cough med prn,  to f/u any worsening symptoms or concerns 

## 2015-10-18 NOTE — Telephone Encounter (Signed)
Pt advised in detail via personal VM 

## 2015-10-18 NOTE — Telephone Encounter (Signed)
[place] for pt to try abreva otc, thanks

## 2015-10-18 NOTE — Assessment & Plan Note (Signed)
Also for cxr, r/o pna, no wheezing, would hold on steroid tx,  to f/u any worsening symptoms or concerns

## 2015-10-18 NOTE — Assessment & Plan Note (Signed)
stable overall by history and exam, recent data reviewed with pt, and pt to continue medical treatment as before,  to f/u any worsening symptoms or concerns BP Readings from Last 3 Encounters:  10/17/15 122/78  09/27/15 138/82  09/18/15 166/73

## 2015-10-23 DIAGNOSIS — M961 Postlaminectomy syndrome, not elsewhere classified: Secondary | ICD-10-CM | POA: Diagnosis not present

## 2015-10-23 DIAGNOSIS — M5136 Other intervertebral disc degeneration, lumbar region: Secondary | ICD-10-CM | POA: Diagnosis not present

## 2015-10-23 DIAGNOSIS — M1288 Other specific arthropathies, not elsewhere classified, other specified site: Secondary | ICD-10-CM | POA: Diagnosis not present

## 2015-11-09 DIAGNOSIS — M47816 Spondylosis without myelopathy or radiculopathy, lumbar region: Secondary | ICD-10-CM | POA: Diagnosis not present

## 2015-11-09 DIAGNOSIS — M1288 Other specific arthropathies, not elsewhere classified, other specified site: Secondary | ICD-10-CM | POA: Diagnosis not present

## 2015-11-09 DIAGNOSIS — M4806 Spinal stenosis, lumbar region: Secondary | ICD-10-CM | POA: Diagnosis not present

## 2015-11-09 DIAGNOSIS — M961 Postlaminectomy syndrome, not elsewhere classified: Secondary | ICD-10-CM | POA: Diagnosis not present

## 2015-11-09 DIAGNOSIS — M5136 Other intervertebral disc degeneration, lumbar region: Secondary | ICD-10-CM | POA: Diagnosis not present

## 2015-11-12 ENCOUNTER — Ambulatory Visit (INDEPENDENT_AMBULATORY_CARE_PROVIDER_SITE_OTHER): Payer: Medicare Other | Admitting: Podiatry

## 2015-11-12 DIAGNOSIS — M79676 Pain in unspecified toe(s): Secondary | ICD-10-CM | POA: Diagnosis not present

## 2015-11-12 DIAGNOSIS — B351 Tinea unguium: Secondary | ICD-10-CM | POA: Diagnosis not present

## 2015-11-14 NOTE — Progress Notes (Signed)
Subjective:     Patient ID: [name], male   DOB: 1936/05/25, 80 y.o.   MRN: [id number]  HPI patient presents with nail disease 1-5 both feet that are thick yellow brittle and can become painful and he cannot cut   Review of Systems     Objective:   Physical Exam Neurovascular status intact muscle strength adequate range of motion within normal limits with thick yellow brittle nailbeds 1-5 both feet that are painful    Assessment:     Mycotic nail infections 1-5 both feet with pain    Plan:     Debris painful nailbeds 1-5 both feet with no iatrogenic bleeding noted

## 2015-11-15 ENCOUNTER — Ambulatory Visit: Payer: BLUE CROSS/BLUE SHIELD | Admitting: Internal Medicine

## 2015-11-15 DIAGNOSIS — Z961 Presence of intraocular lens: Secondary | ICD-10-CM | POA: Diagnosis not present

## 2015-11-15 DIAGNOSIS — H401133 Primary open-angle glaucoma, bilateral, severe stage: Secondary | ICD-10-CM | POA: Diagnosis not present

## 2015-11-15 DIAGNOSIS — H04123 Dry eye syndrome of bilateral lacrimal glands: Secondary | ICD-10-CM | POA: Diagnosis not present

## 2015-11-15 DIAGNOSIS — H16103 Unspecified superficial keratitis, bilateral: Secondary | ICD-10-CM | POA: Diagnosis not present

## 2015-11-19 DIAGNOSIS — N138 Other obstructive and reflux uropathy: Secondary | ICD-10-CM | POA: Diagnosis not present

## 2015-11-19 DIAGNOSIS — Z8551 Personal history of malignant neoplasm of bladder: Secondary | ICD-10-CM | POA: Diagnosis not present

## 2015-11-19 DIAGNOSIS — Z0000 Encounter for general adult medical examination without abnormal findings: Secondary | ICD-10-CM | POA: Diagnosis not present

## 2015-11-19 DIAGNOSIS — R351 Nocturia: Secondary | ICD-10-CM | POA: Diagnosis not present

## 2015-11-19 DIAGNOSIS — N401 Enlarged prostate with lower urinary tract symptoms: Secondary | ICD-10-CM | POA: Diagnosis not present

## 2015-11-21 ENCOUNTER — Ambulatory Visit: Payer: BLUE CROSS/BLUE SHIELD | Admitting: Internal Medicine

## 2015-11-22 DIAGNOSIS — M5136 Other intervertebral disc degeneration, lumbar region: Secondary | ICD-10-CM | POA: Diagnosis not present

## 2015-11-22 DIAGNOSIS — M1288 Other specific arthropathies, not elsewhere classified, other specified site: Secondary | ICD-10-CM | POA: Diagnosis not present

## 2015-11-22 DIAGNOSIS — M961 Postlaminectomy syndrome, not elsewhere classified: Secondary | ICD-10-CM | POA: Diagnosis not present

## 2015-11-23 DIAGNOSIS — M1288 Other specific arthropathies, not elsewhere classified, other specified site: Secondary | ICD-10-CM | POA: Diagnosis not present

## 2015-11-23 DIAGNOSIS — M5136 Other intervertebral disc degeneration, lumbar region: Secondary | ICD-10-CM | POA: Diagnosis not present

## 2015-11-27 ENCOUNTER — Ambulatory Visit (INDEPENDENT_AMBULATORY_CARE_PROVIDER_SITE_OTHER): Payer: Medicare Other | Admitting: Internal Medicine

## 2015-11-27 ENCOUNTER — Encounter: Payer: Self-pay | Admitting: Internal Medicine

## 2015-11-27 VITALS — BP 150/80 | HR 57 | Ht 66.0 in | Wt 196.0 lb

## 2015-11-27 DIAGNOSIS — R0789 Other chest pain: Secondary | ICD-10-CM | POA: Diagnosis not present

## 2015-11-27 DIAGNOSIS — K219 Gastro-esophageal reflux disease without esophagitis: Secondary | ICD-10-CM

## 2015-11-27 DIAGNOSIS — R194 Change in bowel habit: Secondary | ICD-10-CM

## 2015-11-27 NOTE — Patient Instructions (Signed)
You have been scheduled for an endoscopy. Please follow written instructions given to you at your visit today. If you use inhalers (even only as needed), please bring them with you on the day of your procedure. Your physician has requested that you go to www.startemmi.com and enter the access code given to you at your visit today. This web site gives a general overview about your procedure. However, you should still follow specific instructions given to you by our office regarding your preparation for the procedure.    You may use metamucil daily in water or juice

## 2015-11-27 NOTE — Progress Notes (Signed)
HISTORY OF PRESENT ILLNESS:  [name] is a 80 y.o. male with multiple medical problems who is sent today by his primary care provider Dr. Jenny Reichmann with chief complaints of noncardiac chest pain, GERD, and constipation. He has not been seen here since 2010. Previous GI patient Dr. Verl Blalock and Dr. Lyla Son. The patient's recent history is that of chest pain described as pressure for which she was admitted to the hospital 09/16/2015 through 09/18/2015. Cardiac cause ruled out. GI cause questioned. Prescribed Carafate which she is no longer taking. Does take twice a day PPI. Occasional breakthrough GERD symptoms but not significant. No significant dysphagia. Prior upper endoscopy with esophagitis and small hiatal hernia. Gallbladder is intact. Patient states that he has occasional constipation and occasional loose stools. He does take pain medication as well as magnesium. Stool softener resultant diarrhea. Patient reports no further episodes of chest pain since hospital discharge. He has chronic back pain. He does inquire about colonoscopy. He has had prior colonoscopies in 2003, 2009 (negative) and sigmoidoscopy in 2010.  REVIEW OF SYSTEMS:  All non-GI ROS negative except for arthritis, back pain, cough, hearing problems, itching, muscle cramps, sleeping problems  Past Medical History  Diagnosis Date  . Diverticulosis of colon (without mention of hemorrhage)   . Bradycardia   . Bladder cancer ([place])   . Allergic rhinitis   . CAD (coronary artery disease)   . CHF (congestive heart failure) ([place])   . Rheumatoid arthritis(714.0)   . HTN (hypertension)   . HLD (hyperlipidemia)   . GERD (gastroesophageal reflux disease)   . Depression   . History of colon polyps   . Glaucoma   . Fatty liver disease, nonalcoholic   . DDD (degenerative disc disease), lumbosacral   . History of peptic ulcer   . History of hiatal hernia   . Right renal artery stenosis ([facility])     per cath 03-14-2009 ---  40-50%  . RLS (restless legs syndrome)   . History of bladder cancer     2004--  TCC low-grade , non-invasive  . History of kidney stones   . BPH (benign prostatic hyperplasia)   . Weak urinary stream   . Nocturia   . History of exercise stress test     02-05-2007-- ETT (Clinically positive, electrically equivocal, submaximal ETT,  appropriate bp response to exercise  . Essential tremor   . Chronic low back pain   . Wears glasses   . Wears dentures     upper  . Wears partial dentures     lower  . Wears hearing aid     bilateral  . Vertigo, intermittent   . Productive cough   . Acute bronchitis per pt no fever--  cough since discharge from hospital 07-02-2015    per pcp note 07-05-2015  mild to moderate bronchitis vs pna    Past Surgical History  Procedure Laterality Date  . Lumbar disc surgery  07-16-2005  . Lumbar laminectomy/decompression microdiscectomy  07/09/2011    Procedure: LUMBAR LAMINECTOMY/DECOMPRESSION MICRODISCECTOMY;  Surgeon: Johnn Hai;  Location: [facility];  Service: Orthopedics;  Laterality: Left;  Decompression L5 - S1 on the Left and repair of dura  . Lumbar laminectomy/decompression microdiscectomy N/A 07/27/2013    Procedure: DECOMPRESSION L4 - L5 WITH EXCISION OF SYNOVIAL CYST AND, LATERAL MASS FUSION 1 LEVEL;  Surgeon: Johnn Hai, MD;  Location: [facility];  Service: Orthopedics;  Laterality: N/A;  . Lumbar laminectomy/decompression microdiscectomy Left 07/06/2014    Procedure:  MICRO LUMBAR DECOMPRESSION L5-S1 ON LEFT, L4-5 REDO;  Surgeon: Johnn Hai, MD;  Location: [facility];  Service: Orthopedics;  Laterality: Left;  . Cardiac catheterization  02-12-2007  dr gregg taylor    Non-obstructive CAD,  20% pLCX,  20% LAD, ef 65%  . Cardiac catheterization  03-14-2009  dr Johnsie Cancel    pLAD and mid diaginal 20-30% multiple lesions,  OM1 20%, right renal ostial 40-50%,  ef 60%  . Transurethral resection of bladder tumor  09-21-2002  . Tonsillectomy and  adenoidectomy    . Hip arthroscopy w/ labral debridement Left 06-29-2000    and chondraplasty  . Transthoracic echocardiogram  09-20-2010    normal LVF, ef 55-65%/  mild MR, TR and PR/  mild LAE  . Cataract extraction w/ intraocular lens  implant, bilateral  2016  . Cystoscopy with biopsy N/A 07/11/2015    Procedure: CYSTOSCOPY WITH COLD CUP RESECTION AND FULGERATION;  Surgeon: Rana Snare, MD;  Location: [facility];  Service: Urology;  Laterality: N/A;    Social History [place]  reports that he quit smoking about 33 years ago. His smoking use included Cigarettes. He has a 40 pack-year smoking history. He has never used smokeless tobacco. He reports that he does not drink alcohol or use illicit drugs.  family history includes Cancer in his brother, father, and sister; Diabetes in his sister; Heart attack in his mother.  Allergies  Allergen Reactions  . Morphine And Related Other (See Comments)    Migraine   . Hydrochlorothiazide Other (See Comments)    REACTION: hyponatremia with daily use  . Tape Itching    Patient  Is ok to use paper tape  . Lisinopril Cough  . Sulfa Antibiotics Rash       PHYSICAL EXAMINATION: Vital signs: BP 150/80 mmHg  Pulse 57  Ht 5\' 6"  (1.676 m)  Wt 196 lb (88.905 kg)  BMI 31.65 kg/m2  SpO2 97%  Constitutional: generally well-appearingElderly male, no acute distress Psychiatric: alert and oriented x3, cooperative Eyes: extraocular movements intact, anicteric, conjunctiva pink Mouth: oral pharynx moist, no lesions Neck: supple no lymphadenopathy Cardiovascular: heart regular rate and rhythm, no murmur Lungs: clear to auscultation bilaterally Abdomen: soft, nontender, nondistended, no obvious ascites, no peritoneal signs, normal bowel sounds, no organomegaly. Back brace Rectal: Omitted Extremities: no clubbing cyanosis or lower extremity edema bilaterally Skin: no lesions on visible extremities Neuro: No focal deficits.  Cranial nerves intact  ASSESSMENT:  #1. Recent episode of noncardiac chest pain. No recurrence #2. GERD with occasional breakthrough #3. Alternating bowel habits #4. Multiple medical problems #5. Remote history of colon polyps. Aged out of surveillance   PLAN:  #1. Strict that he years to reflux precautions #2. Continue twice a day PPI #3. Schedule upper endoscopy to evaluate chest pain and breakthrough GERD symptoms #4. Recommended Metamucil 2 tablespoons daily for alternating bowel habits #5. Consider gallbladder ultrasound if patient has recurrent pain

## 2015-11-28 ENCOUNTER — Ambulatory Visit: Payer: BLUE CROSS/BLUE SHIELD | Admitting: Internal Medicine

## 2015-11-28 DIAGNOSIS — M5136 Other intervertebral disc degeneration, lumbar region: Secondary | ICD-10-CM | POA: Diagnosis not present

## 2015-11-28 DIAGNOSIS — M1288 Other specific arthropathies, not elsewhere classified, other specified site: Secondary | ICD-10-CM | POA: Diagnosis not present

## 2015-12-04 ENCOUNTER — Encounter: Payer: Self-pay | Admitting: Internal Medicine

## 2015-12-04 ENCOUNTER — Ambulatory Visit (AMBULATORY_SURGERY_CENTER): Payer: Medicare Other | Admitting: Internal Medicine

## 2015-12-04 VITALS — BP 124/79 | HR 47 | Temp 98.6°F | Resp 14 | Ht 66.0 in | Wt 196.0 lb

## 2015-12-04 DIAGNOSIS — K219 Gastro-esophageal reflux disease without esophagitis: Secondary | ICD-10-CM | POA: Diagnosis present

## 2015-12-04 DIAGNOSIS — I10 Essential (primary) hypertension: Secondary | ICD-10-CM | POA: Diagnosis not present

## 2015-12-04 DIAGNOSIS — N400 Enlarged prostate without lower urinary tract symptoms: Secondary | ICD-10-CM | POA: Diagnosis not present

## 2015-12-04 DIAGNOSIS — R079 Chest pain, unspecified: Secondary | ICD-10-CM | POA: Diagnosis not present

## 2015-12-04 DIAGNOSIS — I2510 Atherosclerotic heart disease of native coronary artery without angina pectoris: Secondary | ICD-10-CM | POA: Diagnosis not present

## 2015-12-04 DIAGNOSIS — I509 Heart failure, unspecified: Secondary | ICD-10-CM | POA: Diagnosis not present

## 2015-12-04 DIAGNOSIS — M069 Rheumatoid arthritis, unspecified: Secondary | ICD-10-CM | POA: Diagnosis not present

## 2015-12-04 DIAGNOSIS — F329 Major depressive disorder, single episode, unspecified: Secondary | ICD-10-CM | POA: Diagnosis not present

## 2015-12-04 MED ORDER — SODIUM CHLORIDE 0.9 % IV SOLN: 500.0000 mL | INTRAVENOUS | Status: DC

## 2015-12-04 NOTE — Progress Notes (Signed)
Report to PACU, RN, vss, BBS= Clear.  

## 2015-12-04 NOTE — Op Note (Signed)
[facility] Patient Name: [name] Procedure Date: 12/04/2015 2:33 PM MRN: [id number] Endoscopist: Docia Chuck. Henrene Pastor , MD Age: 80 Referring MD:  Date of Birth: [birth year] Gender: Male Procedure:                Upper GI endoscopy Indications:              Chest pain (non cardiac). Has not recurred since                            hospital evaluation Medicines:                Monitored Anesthesia Care Procedure:                Pre-Anesthesia Assessment:                           - Prior to the procedure, a History and Physical                            was performed, and patient medications and                            allergies were reviewed. The patient's tolerance of                            previous anesthesia was also reviewed. The risks                            and benefits of the procedure and the sedation                            options and risks were discussed with the patient.                            All questions were answered, and informed consent                            was obtained. Prior Anticoagulants: The patient has                            taken no previous anticoagulant or antiplatelet                            agents. ASA Grade Assessment: III - A patient with                            severe systemic disease. After reviewing the risks                            and benefits, the patient was deemed in                            satisfactory condition to undergo the procedure.  After obtaining informed consent, the endoscope was                            passed under direct vision. Throughout the                            procedure, the patient's blood pressure, pulse, and                            oxygen saturations were monitored continuously. The                            Model GIF-HQ190 [id number] scope was introduced                            through the mouth, and advanced to the second part                   of duodenum. The upper GI endoscopy was                            accomplished without difficulty. The patient                            tolerated the procedure well. Scope In: Scope Out: Findings:                 The esophagus was normal.                           The stomach was normal.                           The examined duodenum was normal.                           The cardia and gastric fundus were normal on                            retroflexion. Complications:            No immediate complications. Estimated Blood Loss:     Estimated blood loss: none. Impression:               - Normal esophagus.                           - Normal stomach.                           - Normal examined duodenum. Recommendation:           - Consider abdominal ultrasound to rule out                            gallstones if patient has recurrent chest pain that                            is not cardiac.                           -  Resume previous diet.                           - Continue present medications. Docia Chuck. Henrene Pastor, MD 12/04/2015 2:46:37 PM This report has been signed electronically. CC Letter to:             Biagio Borg, MD

## 2015-12-04 NOTE — Patient Instructions (Signed)
YOU HAD AN ENDOSCOPIC PROCEDURE TODAY AT [facility]:   Refer to the procedure report that was given to you for any specific questions about what was found during the examination.  If the procedure report does not answer your questions, please call your gastroenterologist to clarify.  If you requested that your care partner not be given the details of your procedure findings, then the procedure report has been included in a sealed envelope for you to review at your convenience later.  YOU SHOULD EXPECT: Some feelings of bloating in the abdomen. Passage of more gas than usual.  Walking can help get rid of the air that was put into your GI tract during the procedure and reduce the bloating. If you had a lower endoscopy (such as a colonoscopy or flexible sigmoidoscopy) you may notice spotting of blood in your stool or on the toilet paper. If you underwent a bowel prep for your procedure, you may not have a normal bowel movement for a few days.  Please Note:  You might notice some irritation and congestion in your nose or some drainage.  This is from the oxygen used during your procedure.  There is no need for concern and it should clear up in a day or so.  SYMPTOMS TO REPORT IMMEDIATELY:   Following upper endoscopy (EGD)  Vomiting of blood or coffee ground material  New chest pain or pain under the shoulder blades  Painful or persistently difficult swallowing  New shortness of breath  Fever of 100F or higher  Black, tarry-looking stools  For urgent or emergent issues, a gastroenterologist can be reached at any hour by calling [phone].   DIET: Your first meal following the procedure should be a small meal and then it is ok to progress to your normal diet. Heavy or fried foods are harder to digest and may make you feel nauseous or bloated.  Likewise, meals heavy in dairy and vegetables can increase bloating.  Drink plenty of fluids but you should avoid alcoholic beverages for  24 hours.  Consider abdominal ultrasound if to rule out gallstones if you have recurrent chest pain that is not cardiac.  ACTIVITY:  You should plan to take it easy for the rest of today and you should NOT DRIVE or use heavy machinery until tomorrow (because of the sedation medicines used during the test).    FOLLOW UP: Our staff will call the number listed on your records the next business day following your procedure to check on you and address any questions or concerns that you may have regarding the information given to you following your procedure. If we do not reach you, we will leave a message.  However, if you are feeling well and you are not experiencing any problems, there is no need to return our call.  We will assume that you have returned to your regular daily activities without incident.  If any biopsies were taken you will be contacted by phone or by letter within the next 1-3 weeks.  Please call us at [phone] if you have not heard about the biopsies in 3 weeks.    SIGNATURES/CONFIDENTIALITY: You and/or your care partner have signed paperwork which will be entered into your electronic medical record.  These signatures attest to the fact that that the information above on your After Visit Summary has been reviewed and is understood.  Full responsibility of the confidentiality of this discharge information lies with you and/or your care-partner.

## 2015-12-05 ENCOUNTER — Telehealth: Payer: Self-pay

## 2015-12-05 NOTE — Telephone Encounter (Signed)
  Follow up Call-  Call back number 12/04/2015  Post procedure Call Back phone  # [phone]  Permission to leave phone message Yes     Patient questions:  Do you have a fever, pain , or abdominal swelling? No. Pain Score  0 *  Have you tolerated food without any problems? Yes.    Have you been able to return to your normal activities? Yes.    Do you have any questions about your discharge instructions: Diet   No. Medications  No. Follow up visit  No.  Do you have questions or concerns about your Care? No.  Actions: * If pain score is 4 or above: No action needed, pain <4.

## 2015-12-14 DIAGNOSIS — M47816 Spondylosis without myelopathy or radiculopathy, lumbar region: Secondary | ICD-10-CM | POA: Diagnosis not present

## 2015-12-31 DIAGNOSIS — M961 Postlaminectomy syndrome, not elsewhere classified: Secondary | ICD-10-CM | POA: Diagnosis not present

## 2015-12-31 DIAGNOSIS — M1288 Other specific arthropathies, not elsewhere classified, other specified site: Secondary | ICD-10-CM | POA: Diagnosis not present

## 2015-12-31 DIAGNOSIS — M5136 Other intervertebral disc degeneration, lumbar region: Secondary | ICD-10-CM | POA: Diagnosis not present

## 2016-01-17 ENCOUNTER — Ambulatory Visit (INDEPENDENT_AMBULATORY_CARE_PROVIDER_SITE_OTHER): Payer: Medicare Other | Admitting: Podiatry

## 2016-01-17 ENCOUNTER — Encounter: Payer: Self-pay | Admitting: Podiatry

## 2016-01-17 DIAGNOSIS — B351 Tinea unguium: Secondary | ICD-10-CM

## 2016-01-17 DIAGNOSIS — M79676 Pain in unspecified toe(s): Secondary | ICD-10-CM | POA: Diagnosis not present

## 2016-01-18 NOTE — Progress Notes (Signed)
Subjective:     Patient ID: [name], male   DOB: Dec 04, 1935, 80 y.o.   MRN: [id number]  HPI patient presents with nail disease 1-5 both feet with thickness and incurvation of the nailbeds   Review of Systems     Objective:   Physical Exam Neurovascular status unchanged with thick yellow brittle nailbeds 1-5 both feet with pain    Assessment:     Mycotic nail infections with pain 1-5 both feet    Plan:     Debris painful nailbeds 1-5 both feet with no iatrogenic bleeding noted

## 2016-01-28 DIAGNOSIS — M1288 Other specific arthropathies, not elsewhere classified, other specified site: Secondary | ICD-10-CM | POA: Diagnosis not present

## 2016-01-28 DIAGNOSIS — M961 Postlaminectomy syndrome, not elsewhere classified: Secondary | ICD-10-CM | POA: Diagnosis not present

## 2016-01-28 DIAGNOSIS — M5136 Other intervertebral disc degeneration, lumbar region: Secondary | ICD-10-CM | POA: Diagnosis not present

## 2016-03-03 ENCOUNTER — Other Ambulatory Visit: Payer: Self-pay | Admitting: *Deleted

## 2016-03-03 MED ORDER — MECLIZINE HCL 25 MG PO TABS: 25.0000 mg | ORAL_TABLET | Freq: Two times a day (BID) | ORAL | 0 refills | Status: DC | PRN

## 2016-03-03 NOTE — Telephone Encounter (Signed)
Left msg on triage requesting refill on his meclizine. Called pt back no answer LMOM refill has been sent to [facility]...Johny Chess

## 2016-03-20 ENCOUNTER — Encounter: Payer: Self-pay | Admitting: Podiatry

## 2016-03-20 ENCOUNTER — Ambulatory Visit (INDEPENDENT_AMBULATORY_CARE_PROVIDER_SITE_OTHER): Payer: Medicare Other | Admitting: Podiatry

## 2016-03-20 ENCOUNTER — Telehealth: Payer: Self-pay | Admitting: Internal Medicine

## 2016-03-20 DIAGNOSIS — M79676 Pain in unspecified toe(s): Secondary | ICD-10-CM

## 2016-03-20 DIAGNOSIS — B351 Tinea unguium: Secondary | ICD-10-CM | POA: Diagnosis not present

## 2016-03-20 MED ORDER — MECLIZINE HCL 25 MG PO TABS: 25.0000 mg | ORAL_TABLET | Freq: Two times a day (BID) | ORAL | 0 refills | Status: DC | PRN

## 2016-03-20 NOTE — Telephone Encounter (Signed)
Pt request refill for meclizine (ANTIVERT) 25 MG tablet send to [facility].

## 2016-03-20 NOTE — Telephone Encounter (Signed)
Verified chart sent meclizine to [facility]...Johny Chess

## 2016-03-21 NOTE — Progress Notes (Signed)
Subjective:     Patient ID: [name], male   DOB: 1935-08-14, 80 y.o.   MRN: [id number]  HPI patient presents with nail disease 1-5 both feet with thick yellow brittle nailbeds 1-5 both feet that are painful   Review of Systems     Objective:   Physical Exam Thick yellow brittle nailbeds 1-5 both feet that are painful    Assessment:     Mycotic nail infection with pain 1-5 both feet    Plan:     Debris painful nailbeds 1-5 both feet with no iatrogenic bleeding noted

## 2016-03-25 DIAGNOSIS — M5136 Other intervertebral disc degeneration, lumbar region: Secondary | ICD-10-CM | POA: Diagnosis not present

## 2016-03-25 DIAGNOSIS — M1288 Other specific arthropathies, not elsewhere classified, other specified site: Secondary | ICD-10-CM | POA: Diagnosis not present

## 2016-03-25 DIAGNOSIS — M961 Postlaminectomy syndrome, not elsewhere classified: Secondary | ICD-10-CM | POA: Diagnosis not present

## 2016-04-29 DIAGNOSIS — Z23 Encounter for immunization: Secondary | ICD-10-CM | POA: Diagnosis not present

## 2016-05-04 ENCOUNTER — Other Ambulatory Visit: Payer: Self-pay | Admitting: Internal Medicine

## 2016-05-08 DIAGNOSIS — H401123 Primary open-angle glaucoma, left eye, severe stage: Secondary | ICD-10-CM | POA: Diagnosis not present

## 2016-05-08 DIAGNOSIS — H401113 Primary open-angle glaucoma, right eye, severe stage: Secondary | ICD-10-CM | POA: Diagnosis not present

## 2016-05-19 DIAGNOSIS — R351 Nocturia: Secondary | ICD-10-CM | POA: Diagnosis not present

## 2016-05-19 DIAGNOSIS — Z8551 Personal history of malignant neoplasm of bladder: Secondary | ICD-10-CM | POA: Diagnosis not present

## 2016-05-19 DIAGNOSIS — R3912 Poor urinary stream: Secondary | ICD-10-CM | POA: Diagnosis not present

## 2016-05-19 DIAGNOSIS — N401 Enlarged prostate with lower urinary tract symptoms: Secondary | ICD-10-CM | POA: Diagnosis not present

## 2016-05-22 ENCOUNTER — Encounter (HOSPITAL_COMMUNITY): Payer: Self-pay | Admitting: *Deleted

## 2016-05-22 ENCOUNTER — Emergency Department (HOSPITAL_COMMUNITY): Payer: Medicare Other

## 2016-05-22 ENCOUNTER — Inpatient Hospital Stay (HOSPITAL_COMMUNITY)
Admission: EM | Admit: 2016-05-22 | Discharge: 2016-05-25 | DRG: 690 | Disposition: A | Discharge status: Home / Self Care | Payer: Medicare Other | Attending: Family Medicine | Admitting: Family Medicine

## 2016-05-22 DIAGNOSIS — E860 Dehydration: Secondary | ICD-10-CM | POA: Diagnosis not present

## 2016-05-22 DIAGNOSIS — D72829 Elevated white blood cell count, unspecified: Secondary | ICD-10-CM

## 2016-05-22 DIAGNOSIS — Z79899 Other long term (current) drug therapy: Secondary | ICD-10-CM

## 2016-05-22 DIAGNOSIS — B965 Pseudomonas (aeruginosa) (mallei) (pseudomallei) as the cause of diseases classified elsewhere: Secondary | ICD-10-CM | POA: Diagnosis not present

## 2016-05-22 DIAGNOSIS — Z87891 Personal history of nicotine dependence: Secondary | ICD-10-CM

## 2016-05-22 DIAGNOSIS — I10 Essential (primary) hypertension: Secondary | ICD-10-CM | POA: Diagnosis present

## 2016-05-22 DIAGNOSIS — E876 Hypokalemia: Secondary | ICD-10-CM | POA: Diagnosis not present

## 2016-05-22 DIAGNOSIS — I272 Pulmonary hypertension, unspecified: Secondary | ICD-10-CM | POA: Diagnosis present

## 2016-05-22 DIAGNOSIS — C679 Malignant neoplasm of bladder, unspecified: Secondary | ICD-10-CM | POA: Diagnosis present

## 2016-05-22 DIAGNOSIS — E785 Hyperlipidemia, unspecified: Secondary | ICD-10-CM | POA: Diagnosis present

## 2016-05-22 DIAGNOSIS — R0602 Shortness of breath: Secondary | ICD-10-CM | POA: Diagnosis not present

## 2016-05-22 DIAGNOSIS — R531 Weakness: Secondary | ICD-10-CM | POA: Diagnosis not present

## 2016-05-22 DIAGNOSIS — G250 Essential tremor: Secondary | ICD-10-CM | POA: Diagnosis present

## 2016-05-22 DIAGNOSIS — E871 Hypo-osmolality and hyponatremia: Secondary | ICD-10-CM | POA: Diagnosis present

## 2016-05-22 DIAGNOSIS — H409 Unspecified glaucoma: Secondary | ICD-10-CM | POA: Diagnosis present

## 2016-05-22 DIAGNOSIS — N3000 Acute cystitis without hematuria: Secondary | ICD-10-CM | POA: Diagnosis not present

## 2016-05-22 DIAGNOSIS — Z8551 Personal history of malignant neoplasm of bladder: Secondary | ICD-10-CM | POA: Diagnosis not present

## 2016-05-22 DIAGNOSIS — F329 Major depressive disorder, single episode, unspecified: Secondary | ICD-10-CM | POA: Diagnosis present

## 2016-05-22 DIAGNOSIS — K219 Gastro-esophageal reflux disease without esophagitis: Secondary | ICD-10-CM | POA: Diagnosis present

## 2016-05-22 DIAGNOSIS — N400 Enlarged prostate without lower urinary tract symptoms: Secondary | ICD-10-CM | POA: Diagnosis present

## 2016-05-22 DIAGNOSIS — N390 Urinary tract infection, site not specified: Secondary | ICD-10-CM | POA: Diagnosis not present

## 2016-05-22 DIAGNOSIS — I2510 Atherosclerotic heart disease of native coronary artery without angina pectoris: Secondary | ICD-10-CM | POA: Diagnosis present

## 2016-05-22 DIAGNOSIS — Z7982 Long term (current) use of aspirin: Secondary | ICD-10-CM

## 2016-05-22 DIAGNOSIS — R509 Fever, unspecified: Secondary | ICD-10-CM | POA: Diagnosis not present

## 2016-05-22 DIAGNOSIS — R5381 Other malaise: Secondary | ICD-10-CM | POA: Diagnosis present

## 2016-05-22 DIAGNOSIS — T502X5A Adverse effect of carbonic-anhydrase inhibitors, benzothiadiazides and other diuretics, initial encounter: Secondary | ICD-10-CM | POA: Diagnosis present

## 2016-05-22 DIAGNOSIS — M069 Rheumatoid arthritis, unspecified: Secondary | ICD-10-CM | POA: Diagnosis present

## 2016-05-22 DIAGNOSIS — I701 Atherosclerosis of renal artery: Secondary | ICD-10-CM | POA: Diagnosis present

## 2016-05-22 LAB — URINE MICROSCOPIC-ADD ON

## 2016-05-22 LAB — BASIC METABOLIC PANEL
Anion gap: 10 (ref 5–15)
BUN: 19 mg/dL (ref 6–20)
CO2: 23 mmol/L (ref 22–32)
CREATININE: 1.24 mg/dL (ref 0.61–1.24)
Calcium: 8.7 mg/dL — ABNORMAL LOW (ref 8.9–10.3)
Chloride: 97 mmol/L — ABNORMAL LOW (ref 101–111)
GFR calc Af Amer: >60 mL/min (ref >60)
GFR, EST NON AFRICAN AMERICAN: 54 mL/min — AB (ref >60)
GLUCOSE: 180 mg/dL — AB (ref 65–99)
POTASSIUM: 2.7 mmol/L — AB (ref 3.5–5.1)
SODIUM: 130 mmol/L — AB (ref 135–145)

## 2016-05-22 LAB — CBC
HCT: 38.9 % — ABNORMAL LOW (ref 39.0–52.0)
HEMATOCRIT: 40.1 % (ref 39.0–52.0)
HEMOGLOBIN: 13.6 g/dL (ref 13.0–17.0)
Hemoglobin: 14 g/dL (ref 13.0–17.0)
MCH: 31 pg (ref 26.0–34.0)
MCH: 31.5 pg (ref 26.0–34.0)
MCHC: 34.9 g/dL (ref 30.0–36.0)
MCHC: 35 g/dL (ref 30.0–36.0)
MCV: 88.7 fL (ref 78.0–100.0)
MCV: 90 fL (ref 78.0–100.0)
PLATELETS: 177 10*3/uL (ref 150–400)
PLATELETS: 141 10*3/uL — AB (ref 150–400)
RBC: 4.32 MIL/uL (ref 4.22–5.81)
RBC: 4.52 MIL/uL (ref 4.22–5.81)
RDW: 13.2 % (ref 11.5–15.5)
RDW: 13.4 % (ref 11.5–15.5)
WBC: 20.6 10*3/uL — ABNORMAL HIGH (ref 4.0–10.5)
WBC: 21.2 10*3/uL — AB (ref 4.0–10.5)

## 2016-05-22 LAB — URINALYSIS, ROUTINE W REFLEX MICROSCOPIC
BILIRUBIN URINE: NEGATIVE
GLUCOSE, UA: NEGATIVE mg/dL
KETONES UR: NEGATIVE mg/dL
NITRITE: NEGATIVE
PH: 6 (ref 5.0–8.0)
PROTEIN: NEGATIVE mg/dL
Specific Gravity, Urine: 1.034 — ABNORMAL HIGH (ref 1.005–1.030)

## 2016-05-22 LAB — I-STAT TROPONIN, ED: TROPONIN I, POC: 0.01 ng/mL (ref 0.00–0.08)

## 2016-05-22 LAB — I-STAT CG4 LACTIC ACID, ED
LACTIC ACID, VENOUS: 2.42 mmol/L — AB (ref 0.5–1.9)
Lactic Acid, Venous: 3.08 mmol/L (ref 0.5–1.9)

## 2016-05-22 LAB — CBG MONITORING, ED: Glucose-Capillary: 150 mg/dL — ABNORMAL HIGH (ref 65–99)

## 2016-05-22 LAB — D-DIMER, QUANTITATIVE: D-Dimer, Quant: 0.32 ug/mL-FEU (ref 0.00–0.50)

## 2016-05-22 MED ORDER — TAMSULOSIN HCL 0.4 MG PO CAPS
0.8000 mg | ORAL_CAPSULE | Freq: Every day | ORAL | Status: DC
  Administered 2016-05-23 – 2016-05-24 (×3): 0.8 mg via ORAL
  Filled 2016-05-22 (×3): qty 2

## 2016-05-22 MED ORDER — CITALOPRAM HYDROBROMIDE 10 MG PO TABS
20.0000 mg | ORAL_TABLET | Freq: Every day | ORAL | Status: DC
  Filled 2016-05-22: qty 2

## 2016-05-22 MED ORDER — PIPERACILLIN-TAZOBACTAM 3.375 G IVPB 30 MIN
3.3750 g | Freq: Once | INTRAVENOUS | Status: AC
  Administered 2016-05-22: 3.375 g via INTRAVENOUS
  Filled 2016-05-22: qty 50

## 2016-05-22 MED ORDER — PIPERACILLIN-TAZOBACTAM 3.375 G IVPB
3.3750 g | Freq: Three times a day (TID) | INTRAVENOUS | Status: DC
  Administered 2016-05-23 – 2016-05-25 (×7): 3.375 g via INTRAVENOUS
  Filled 2016-05-22 (×7): qty 50

## 2016-05-22 MED ORDER — PANTOPRAZOLE SODIUM 40 MG PO TBEC
40.0000 mg | DELAYED_RELEASE_TABLET | Freq: Two times a day (BID) | ORAL | Status: DC
  Administered 2016-05-23 – 2016-05-25 (×6): 40 mg via ORAL
  Filled 2016-05-22 (×6): qty 1

## 2016-05-22 MED ORDER — FLUTICASONE PROPIONATE 50 MCG/ACT NA SUSP
2.0000 | Freq: Every evening | NASAL | Status: DC
  Administered 2016-05-23 – 2016-05-24 (×2): 2 via NASAL
  Filled 2016-05-22: qty 16

## 2016-05-22 MED ORDER — POTASSIUM CHLORIDE CRYS ER 20 MEQ PO TBCR
40.0000 meq | EXTENDED_RELEASE_TABLET | Freq: Once | ORAL | Status: AC
  Administered 2016-05-22: 40 meq via ORAL
  Filled 2016-05-22: qty 2

## 2016-05-22 MED ORDER — SODIUM CHLORIDE 0.9 % IV SOLN
Freq: Once | INTRAVENOUS | Status: AC
  Administered 2016-05-22: 23:00:00 via INTRAVENOUS

## 2016-05-22 MED ORDER — BRIMONIDINE TARTRATE 0.15 % OP SOLN
1.0000 [drp] | Freq: Two times a day (BID) | OPHTHALMIC | Status: DC
  Administered 2016-05-23 – 2016-05-25 (×6): 1 [drp] via OPHTHALMIC
  Filled 2016-05-22: qty 5

## 2016-05-22 MED ORDER — VANCOMYCIN HCL IN DEXTROSE 750-5 MG/150ML-% IV SOLN
750.0000 mg | Freq: Two times a day (BID) | INTRAVENOUS | Status: DC
  Administered 2016-05-23 – 2016-05-24 (×3): 750 mg via INTRAVENOUS
  Filled 2016-05-22 (×4): qty 150

## 2016-05-22 MED ORDER — POTASSIUM CHLORIDE CRYS ER 20 MEQ PO TBCR
20.0000 meq | EXTENDED_RELEASE_TABLET | Freq: Every day | ORAL | Status: DC
  Administered 2016-05-23 – 2016-05-25 (×3): 20 meq via ORAL
  Filled 2016-05-22 (×3): qty 1

## 2016-05-22 MED ORDER — SODIUM CHLORIDE 0.9 % IV SOLN
INTRAVENOUS | Status: AC
  Administered 2016-05-23 (×3): via INTRAVENOUS

## 2016-05-22 MED ORDER — SIMVASTATIN 20 MG PO TABS
60.0000 mg | ORAL_TABLET | Freq: Every day | ORAL | Status: DC
  Administered 2016-05-23 – 2016-05-24 (×3): 60 mg via ORAL
  Filled 2016-05-22 (×3): qty 1

## 2016-05-22 MED ORDER — NITROGLYCERIN 0.4 MG SL SUBL: 0.4000 mg | SUBLINGUAL_TABLET | SUBLINGUAL | Status: DC | PRN

## 2016-05-22 MED ORDER — SODIUM CHLORIDE 0.9 % IV BOLUS (SEPSIS)
1000.0000 mL | Freq: Once | INTRAVENOUS | Status: AC
Start: 2016-05-22 — End: 2016-05-22
  Administered 2016-05-22: 1000 mL via INTRAVENOUS

## 2016-05-22 MED ORDER — GABAPENTIN 300 MG PO CAPS
300.0000 mg | ORAL_CAPSULE | Freq: Three times a day (TID) | ORAL | Status: DC
  Administered 2016-05-23 – 2016-05-25 (×9): 300 mg via ORAL
  Filled 2016-05-22 (×9): qty 1

## 2016-05-22 MED ORDER — SODIUM CHLORIDE 0.9 % IV SOLN
30.0000 meq | Freq: Once | INTRAVENOUS | Status: AC
  Administered 2016-05-22: 30 meq via INTRAVENOUS
  Filled 2016-05-22: qty 15

## 2016-05-22 MED ORDER — LATANOPROST 0.005 % OP SOLN
1.0000 [drp] | Freq: Every day | OPHTHALMIC | Status: DC
  Administered 2016-05-23 – 2016-05-24 (×3): 1 [drp] via OPHTHALMIC
  Filled 2016-05-22: qty 2.5

## 2016-05-22 MED ORDER — PRIMIDONE 250 MG PO TABS
125.0000 mg | ORAL_TABLET | Freq: Three times a day (TID) | ORAL | Status: DC
  Administered 2016-05-23 – 2016-05-25 (×9): 125 mg via ORAL
  Filled 2016-05-22 (×9): qty 1

## 2016-05-22 MED ORDER — MECLIZINE HCL 25 MG PO TABS
25.0000 mg | ORAL_TABLET | Freq: Every day | ORAL | Status: DC
Start: 2016-05-22 — End: 2016-05-25
  Administered 2016-05-23 – 2016-05-24 (×3): 25 mg via ORAL
  Filled 2016-05-22 (×3): qty 1

## 2016-05-22 MED ORDER — TIMOLOL MALEATE 0.5 % OP SOLN
1.0000 [drp] | Freq: Two times a day (BID) | OPHTHALMIC | Status: DC
  Administered 2016-05-23 – 2016-05-25 (×6): 1 [drp] via OPHTHALMIC
  Filled 2016-05-22: qty 5

## 2016-05-22 MED ORDER — ENOXAPARIN SODIUM 40 MG/0.4ML ~~LOC~~ SOLN
40.0000 mg | Freq: Every day | SUBCUTANEOUS | Status: DC
  Administered 2016-05-23 – 2016-05-24 (×3): 40 mg via SUBCUTANEOUS
  Filled 2016-05-22 (×3): qty 0.4

## 2016-05-22 MED ORDER — VANCOMYCIN HCL IN DEXTROSE 1-5 GM/200ML-% IV SOLN
1000.0000 mg | Freq: Once | INTRAVENOUS | Status: AC
  Administered 2016-05-22: 1000 mg via INTRAVENOUS
  Filled 2016-05-22: qty 200

## 2016-05-22 NOTE — Progress Notes (Signed)
Pharmacy Antibiotic Note  [name] is a 80 y.o. male admitted on 05/22/2016 with sepsis.  Pharmacy has been consulted for Vanc/Zosyn dosing.  Plan: Vancomycin 1g x 1 then 750mg  IV every 12 hours.  Goal trough 15-20 mcg/mL. Zosyn 3.375g IV q8h (4 hour infusion).  Weight: 190 lb (86.2 kg)  Temp (24hrs), Avg:99.7 F (37.6 C), Min:99.7 F (37.6 C), Max:99.7 F (37.6 C)   Recent Labs Lab 05/22/16 1444 05/22/16 1539  WBC 21.2*  --   CREATININE 1.24  --   LATICACIDVEN  --  3.08*    Estimated Creatinine Clearance: 50.6 mL/min (by C-G formula based on SCr of 1.24 mg/dL).    Allergies  Allergen Reactions  . Morphine And Related Other (See Comments)    Migraine   . Hydrochlorothiazide Other (See Comments)    REACTION: hyponatremia with daily use  . Tape Itching    Patient  Is ok to use paper tape  . Lisinopril Cough  . Sulfa Antibiotics Rash    Thank you for allowing pharmacy to be a part of this patient's care.   Adrian Saran, PharmD, BCPS Pager [phone] 05/22/2016 6:15 PM

## 2016-05-22 NOTE — ED Notes (Signed)
Patient attempted to use urinal without success.  Per MD, verbal to in and out cath patient.

## 2016-05-22 NOTE — ED Provider Notes (Signed)
[facility] DEPT Provider Note   CSN: [id number] Arrival date & time: 05/22/16  1410     History   Chief Complaint Chief Complaint  Patient presents with  . Weakness  . Fever    HPI [name] is a 80 y.o. male.  HPI Pt started feeling weak yesterday but it probably has been going on for longer.  Pt has to care for his wife.  He has not been sleeping well .  He is feeling very weak and fatigued.  He feels like his whole body wants to give out.  He does have some pain in the back of his neck.  No stiffness of the neck.  He had a headache for the last month but that got better with tylenol.  He has been coughing.  No vomiting or diarrhea.   Some nausea.   He felt very unsteady last night and felt like his balance was off. Some blood in the urine recently.  He has noticed burning with urination.  He did see the urologist earlier this week and they did a cystoscopy.  The sx started after that.   Past Medical History:  Diagnosis Date  . Acute bronchitis per pt no fever--  cough since discharge from hospital 07-02-2015   per pcp note 07-05-2015  mild to moderate bronchitis vs pna  . Allergic rhinitis   . Bladder cancer ([place])   . BPH (benign prostatic hyperplasia)   . Bradycardia   . CAD (coronary artery disease)   . CHF (congestive heart failure) ([place])   . Chronic low back pain   . DDD (degenerative disc disease), lumbosacral   . Depression   . Diverticulosis of colon (without mention of hemorrhage)   . Essential tremor   . Fatty liver disease, nonalcoholic   . GERD (gastroesophageal reflux disease)   . Glaucoma   . History of bladder cancer    2004--  TCC low-grade , non-invasive  . History of colon polyps   . History of exercise stress test    02-05-2007-- ETT (Clinically positive, electrically equivocal, submaximal ETT,  appropriate bp response to exercise  . History of hiatal hernia   . History of kidney stones   . History of peptic ulcer   . HLD (hyperlipidemia)     . HTN (hypertension)   . Nocturia   . Productive cough   . Rheumatoid arthritis(714.0)   . Right renal artery stenosis ([facility])    per cath 03-14-2009 --- 40-50%  . RLS (restless legs syndrome)   . Vertigo, intermittent   . Weak urinary stream   . Wears dentures    upper  . Wears glasses   . Wears hearing aid    bilateral  . Wears partial dentures    lower    Patient Active Problem List   Diagnosis Date Noted  . Fever blister 10/18/2015  . Dyspnea 10/17/2015  . Bradycardia 09/17/2015  . Chest pain at rest 09/17/2015  . Depression 09/17/2015  . CAD in native artery 09/17/2015  . CHF (congestive heart failure) ([place]) 09/17/2015  . Pulmonary hypertension 09/17/2015  . Bladder cancer ([place]) 09/17/2015  . Essential tremor 09/17/2015  . Chest pain 09/17/2015  . Anginal chest pain at rest [facility]) 09/17/2015  . Hypokalemia 07/13/2015  . Vertigo 07/01/2015  . Dizzinesses 07/01/2015  . Spinal stenosis of lumbar region at multiple levels 07/06/2014  . Angular cheilitis 05/05/2014  . Left lumbar radiculopathy 05/04/2014  . Right elbow pain 12/29/2013  . Lateral  epicondylitis of right elbow 12/29/2013  . Cough 09/14/2013  . Spinal stenosis, lumbar 07/27/2013  . Palpable mass of neck 07/21/2013  . Vasovagal episode 01/10/2013  . Left otitis media 10/28/2012  . Impaired glucose tolerance 10/28/2012  . Cellulitis 04/24/2012  . Hemorrhoids 04/24/2012  . Syncope 04/21/2012  . Preventative health care 11/28/2010  . DEPRESSION 09/12/2010  . RENAL ARTERY STENOSIS 09/12/2010  . FATIGUE 09/12/2010  . BUNION, RIGHT FOOT 02/05/2010  . RECTAL BLEEDING 07/10/2009  . Diarrhea 07/10/2009  . DIVERTICULOSIS OF COLON 03/28/2009  . FATTY LIVER DISEASE 03/28/2009  . BRADYCARDIA 03/20/2009  . DIZZINESS 10/17/2008  . CHEST PAIN 10/17/2008  . Malignant neoplasm of bladder ([place]) 02/07/2008  . ALLERGIC RHINITIS 02/07/2008  . COLONIC POLYPS, HX OF 02/07/2008  . Hyperlipidemia 11/24/2007  .  Essential hypertension 11/24/2007  . GERD 11/24/2007  . HIATAL HERNIA WITH REFLUX 11/24/2007  . RENAL INSUFFICIENCY 11/24/2007  . Rheumatoid arthritis(714.0) 11/24/2007  . [place] DISEASE, LUMBAR 11/24/2007  . ADENOIDECTOMY, HX OF 11/24/2007    Past Surgical History:  Procedure Laterality Date  . CARDIAC CATHETERIZATION  02-12-2007  dr gregg taylor   Non-obstructive CAD,  20% pLCX,  20% LAD, ef 65%  . CARDIAC CATHETERIZATION  03-14-2009  dr Johnsie Cancel   pLAD and mid diaginal 20-30% multiple lesions,  OM1 20%, right renal ostial 40-50%,  ef 60%  . CATARACT EXTRACTION W/ INTRAOCULAR LENS  IMPLANT, BILATERAL  2016  . CYSTOSCOPY WITH BIOPSY N/A 07/11/2015   Procedure: CYSTOSCOPY WITH COLD CUP RESECTION AND FULGERATION;  Surgeon: Rana Snare, MD;  Location: [facility];  Service: Urology;  Laterality: N/A;  . HIP ARTHROSCOPY W/ LABRAL DEBRIDEMENT Left 06-29-2000   and chondraplasty  . LUMBAR [place] SURGERY  07-16-2005  . LUMBAR LAMINECTOMY/DECOMPRESSION MICRODISCECTOMY  07/09/2011   Procedure: LUMBAR LAMINECTOMY/DECOMPRESSION MICRODISCECTOMY;  Surgeon: Johnn Hai;  Location: [facility];  Service: Orthopedics;  Laterality: Left;  Decompression L5 - S1 on the Left and repair of dura  . LUMBAR LAMINECTOMY/DECOMPRESSION MICRODISCECTOMY N/A 07/27/2013   Procedure: DECOMPRESSION L4 - L5 WITH EXCISION OF SYNOVIAL CYST AND, LATERAL MASS FUSION 1 LEVEL;  Surgeon: Johnn Hai, MD;  Location: [facility];  Service: Orthopedics;  Laterality: N/A;  . LUMBAR LAMINECTOMY/DECOMPRESSION MICRODISCECTOMY Left 07/06/2014   Procedure:  MICRO LUMBAR DECOMPRESSION L5-S1 ON LEFT, L4-5 REDO;  Surgeon: Johnn Hai, MD;  Location: [facility];  Service: Orthopedics;  Laterality: Left;  . TONSILLECTOMY AND ADENOIDECTOMY    . TRANSTHORACIC ECHOCARDIOGRAM  09-20-2010   normal LVF, ef 55-65%/  mild MR, TR and PR/  mild LAE  . TRANSURETHRAL RESECTION OF BLADDER TUMOR  09-21-2002       Home Medications    Prior  to Admission medications   Medication Sig Start Date End Date Taking? Authorizing Provider  amLODipine (NORVASC) 5 MG tablet Take 5 mg by mouth daily as needed (for blood pressure).    Yes Historical Provider, MD  brimonidine (ALPHAGAN) 0.15 % ophthalmic solution Place 1 drop into both eyes 2 (two) times daily.   Yes Historical Provider, MD  citalopram (CELEXA) 10 MG tablet Take 1 tablet (10 mg total) by mouth every morning. Patient taking differently: Take 20 mg by mouth at bedtime.  05/17/15  Yes Biagio Borg, MD  clotrimazole (LOTRIMIN) 1 % cream Apply 1 application topically 2 (two) times daily. Prescribed for rash in groin from [place] on 06/20/2014    Yes Historical Provider, MD  fluticasone (FLONASE) 50 MCG/ACT nasal spray USE 2  SPRAYS IN EACH NOSTRIL EVERY DAY AS NEEDED  FOR  RHINITIS/ALLERGIES Patient taking differently: Place 2 sprays into both nostrils every evening.  05/17/15  Yes Biagio Borg, MD  gabapentin (NEURONTIN) 300 MG capsule TAKE 1 TO 2 CAPSULES FOUR TIMES DAILY  AFTER MEALS AND AT BEDTIME. Patient taking differently: TAKE 2 CAPSULES FOUR TIMES DAILY  AFTER MEALS AND AT BEDTIME. 05/05/16  Yes Biagio Borg, MD  hydrochlorothiazide (HYDRODIURIL) 25 MG tablet Take 25 mg by mouth daily.  07/06/15  Yes Historical Provider, MD  hydroxypropyl methylcellulose / hypromellose (ISOPTO TEARS / GONIOVISC) 2.5 % ophthalmic solution Place 1 drop into both eyes 2 (two) times daily.   Yes Historical Provider, MD  influenza vac recom quadrivalent (FLUBLOK) 0.5 ML injection Inject 0.5 mLs into the muscle once.   Yes Historical Provider, MD  losartan (COZAAR) 50 MG tablet Take 50 mg by mouth daily as needed (for blood pressure). Reported on 09/16/2015   Yes Historical Provider, MD  meclizine (ANTIVERT) 25 MG tablet TAKE 1 TABLET TWO TIMES DAILY AS NEEDED FOR DIZZINESS Patient taking differently: Takes 25mg  by mouth at night 05/05/16  Yes Biagio Borg, MD  nitroGLYCERIN (NITROSTAT) 0.4 MG SL tablet Place 1  tablet (0.4 mg total) under the tongue every 5 (five) minutes as needed. Chest pain 08/26/11  Yes Biagio Borg, MD  oxyCODONE-acetaminophen (PERCOCET) 10-325 MG tablet Take 1 tablet by mouth 2 (two) times daily as needed for pain. 04/22/16  Yes Historical Provider, MD  pantoprazole (PROTONIX) 40 MG tablet Take 40 mg by mouth 2 (two) times daily.  11/29/15  Yes Historical Provider, MD  potassium chloride (K-DUR) 10 MEQ tablet Take 20 mEq by mouth daily.  11/27/15  Yes Historical Provider, MD  primidone (MYSOLINE) 250 MG tablet Take 0.5 tablets (125 mg total) by mouth 3 (three) times daily. TAKE 1/2 TABLET THREE TIMES DAILY Patient taking differently: Take 125 mg by mouth 3 (three) times daily.  09/18/15  Yes Belkys A Regalado, MD  simvastatin (ZOCOR) 40 MG tablet TAKE 1 AND 1/2 TABLETS AT BEDTIME Patient taking differently: Take 60 mg by mouth at bedtime.  05/17/15  Yes Biagio Borg, MD  tamsulosin (FLOMAX) 0.4 MG CAPS capsule Take 1 capsule (0.4 mg total) by mouth daily. Patient taking differently: Take 0.8 mg by mouth at bedtime.  05/17/15  Yes Biagio Borg, MD  timolol (TIMOPTIC) 0.5 % ophthalmic solution Place 1 drop into both eyes 2 (two) times daily.  05/30/15  Yes Historical Provider, MD  Travoprost, BAK Free, (TRAVATAN Z) 0.004 % SOLN ophthalmic solution Place 1 drop into both eyes at bedtime. 09/14/13  Yes Biagio Borg, MD  triamcinolone (KENALOG) 0.025 % cream Apply 1 application topically 2 (two) times daily.   Yes Historical Provider, MD  aspirin EC 81 MG EC tablet Take 1 tablet (81 mg total) by mouth daily. Patient not taking: Reported on 05/22/2016 09/18/15   Elmarie Shiley, MD  clotrimazole-betamethasone (LOTRISONE) cream Use as directed twice per day as needed Patient not taking: Reported on 05/22/2016 05/04/14   Biagio Borg, MD  sucralfate (CARAFATE) 1 GM/10ML suspension Take 10 mLs (1 g total) by mouth 4 (four) times daily -  with meals and at bedtime. Patient not taking: Reported on  05/22/2016 09/18/15   Elmarie Shiley, MD    Family History Family History  Problem Relation Age of Onset  . Heart attack Mother   . Cancer Father     lung  .  Cancer Sister     lung  . Diabetes Sister   . Cancer Brother     lung that spread to brain    Social History Social History  Substance Use Topics  . Smoking status: Former Smoker    Packs/day: 2.00    Years: 20.00    Types: Cigarettes    Quit date: 10/08/1982  . Smokeless tobacco: Never Used  . Alcohol use No     Allergies   Morphine and related; Hydrochlorothiazide; Tape; Lisinopril; and Sulfa antibiotics   Review of Systems Review of Systems  All other systems reviewed and are negative.    Physical Exam Updated Vital Signs BP 136/64 (BP Location: Right Arm)   Pulse (!) 55   Temp 99.7 F (37.6 C) (Oral)   Resp 18   Wt 86.2 kg   SpO2 98%   BMI 30.67 kg/m   Physical Exam  Constitutional: No distress.  HENT:  Head: Normocephalic and atraumatic.  Right Ear: External ear normal.  Left Ear: External ear normal.  Dry mucus membranes   Eyes: Conjunctivae are normal. Right eye exhibits no discharge. Left eye exhibits no discharge. No scleral icterus.  Neck: Normal range of motion. Neck supple. No tracheal deviation present.  No meningismus  Cardiovascular: Normal rate, regular rhythm and intact distal pulses.   Pulmonary/Chest: Effort normal and breath sounds normal. No stridor. No respiratory distress. He has no wheezes. He has no rales.  Abdominal: Soft. Bowel sounds are normal. He exhibits no distension. There is no tenderness. There is no rebound and no guarding.  Musculoskeletal: He exhibits no edema or tenderness.  Lymphadenopathy:    He has no cervical adenopathy.  Neurological: He is alert. He has normal strength. No cranial nerve deficit (no facial droop, extraocular movements intact, no slurred speech) or sensory deficit. He exhibits normal muscle tone. He displays no seizure activity.  Coordination normal.  Normal finger to nose  Skin: Skin is warm and dry. No rash noted. He is not diaphoretic.  Psychiatric: He has a normal mood and affect.  Nursing note and vitals reviewed.    ED Treatments / Results  Labs (all labs ordered are listed, but only abnormal results are displayed) Labs Reviewed  BASIC METABOLIC PANEL - Abnormal; Notable for the following:       Result Value   Sodium 130 (*)    Potassium 2.7 (*)    Chloride 97 (*)    Glucose, Bld 180 (*)    Calcium 8.7 (*)    GFR calc non Af Amer 54 (*)    All other components within normal limits  CBC - Abnormal; Notable for the following:    WBC 21.2 (*)    All other components within normal limits  CBG MONITORING, ED - Abnormal; Notable for the following:    Glucose-Capillary 150 (*)    All other components within normal limits  I-STAT CG4 LACTIC ACID, ED - Abnormal; Notable for the following:    Lactic Acid, Venous 3.08 (*)    All other components within normal limits  CULTURE, BLOOD (ROUTINE X 2)  CULTURE, BLOOD (ROUTINE X 2)  URINE CULTURE  URINALYSIS, ROUTINE W REFLEX MICROSCOPIC (NOT AT [facility])  Randolm Idol, ED  I-STAT CG4 LACTIC ACID, ED    EKG  EKG Interpretation  Date/Time:  Thursday May 22 2016 14:37:34 EST Ventricular Rate:  60 PR Interval:    QRS Duration: 99 QT Interval:  407 QTC Calculation: 407 R Axis:   44  Text Interpretation:  Sinus rhythm Repol abnrm suggests ischemia, inferior leads more pronounces since prior EKGs Confirmed by Elizabelle Fite  MD-J, Shenise Wolgamott [id number]) on 05/22/2016 3:00:13 PM       Radiology Dg Chest 2 View  Result Date: 05/22/2016 CLINICAL DATA:  Shortness of breath starting this morning EXAM: CHEST  2 VIEW COMPARISON:  10/17/2015 FINDINGS: Cardiomediastinal silhouette is stable. No segmental infiltrate or pulmonary edema. Elevation of the right hemidiaphragm again noted. Left basilar atelectasis. Osteopenia and mild degenerative changes thoracic spine. IMPRESSION: No  segmental infiltrate or pulmonary edema. Left basilar atelectasis. Electronically Signed   By: Lahoma Crocker M.D.   On: 05/22/2016 16:22   Ct Head Wo Contrast  Result Date: 05/22/2016 CLINICAL DATA:  Weakness for 3 days EXAM: CT HEAD WITHOUT CONTRAST TECHNIQUE: Contiguous axial images were obtained from the base of the skull through the vertex without intravenous contrast. COMPARISON:  07/01/2015 FINDINGS: Brain: No evidence of acute infarction, hemorrhage, hydrocephalus, extra-axial collection or mass lesion/mass effect. There is mild atrophy. Vascular: No hyperdense vessels. Mild carotid artery calcifications. Skull: Mastoid air cells clear. Small sclerotic focus at the right skullbase is unchanged. No fracture. Sinuses/Orbits: Complete opacification of left sphenoid sinus. No acute orbital abnormality. Other: None IMPRESSION: 1. No CT evidence for acute intracranial abnormality. 2. Complete opacification of left sphenoid sinus Electronically Signed   By: Donavan Foil M.D.   On: 05/22/2016 16:49    Procedures Procedures (including critical care time)  Medications Ordered in ED Medications  vancomycin (VANCOCIN) IVPB 1000 mg/200 mL premix (1,000 mg Intravenous New Bag/Given 05/22/16 1844)  vancomycin (VANCOCIN) IVPB 750 mg/150 ml premix (not administered)  piperacillin-tazobactam (ZOSYN) IVPB 3.375 g (not administered)  potassium chloride 30 mEq in sodium chloride 0.9 % 265 mL (KCL MULTIRUN) IVPB (30 mEq Intravenous Given 05/22/16 1844)  sodium chloride 0.9 % bolus 1,000 mL (0 mLs Intravenous Stopped 05/22/16 1742)  piperacillin-tazobactam (ZOSYN) IVPB 3.375 g (3.375 g Intravenous New Bag/Given 05/22/16 1832)  potassium chloride SA (K-DUR,KLOR-CON) CR tablet 40 mEq (40 mEq Oral Given 05/22/16 1833)     Initial Impression / Assessment and Plan / ED Course  I have reviewed the triage vital signs and the nursing notes.  Pertinent labs & imaging results that were available during my care of the  patient were reviewed by me and considered in my medical decision making (see chart for details).  Clinical Course as of May 22 1904  Thu May 22, 2016  1810 WBC elevated.  No PNA on CXR.   [JK]    Clinical Course User Index [JK] Dorie Rank, MD    The patient's laboratory tests show an elevated white blood cell count as well as hypokalemia. Patient's lactic acid level is also elevated. Patient remains normotensive. He is not tachycardic. I doubt sepsis.  Urinalysis still pending. I suspect that could be the source of his infection. Patient was started on IV antibiotics. Plan admission to the hospital for further evaluation and monitoring.  Final Clinical Impressions(s) / ED Diagnoses   Final diagnoses:  Generalized weakness  Leukocytosis, unspecified type  Hypokalemia    New Prescriptions New Prescriptions   No medications on file     Dorie Rank, MD 05/22/16 1906

## 2016-05-22 NOTE — ED Notes (Signed)
Pt made aware of need for urine specimen 

## 2016-05-22 NOTE — ED Notes (Signed)
Second call to 3w nurse not available to take report for bed 1330.

## 2016-05-22 NOTE — H&P (Addendum)
History and Physical    [name] [id number] DOB: [birth year] DOA: 05/22/2016  PCP: Cathlean Cower, MD  Patient coming from: Home.  Chief Complaint: Generalized weakness.  HPI: [name] is a 80 y.o. male with history of labile hypertension, essential tremor, bladder cancer in remission, depression, hyperlipidemia was brought to the ER after patient was finding it difficult to ambulate because of weakness. Patient states last evening after visiting his wife at the nursing home patient started feeling weak and on reaching home patient was stumbling to get towards his house. This morning patient was unable to ambulate because of weakness. Denies any nausea vomiting or abdominal pain chest pain. Patient states he has been having periods of shortness of breath but on my exam patient is presently not in respiratory distress. He has had one episode of diarrhea in the ER. Patient was having low normal blood pressure in the ER and blood work showed leukocytosis and U shape is concerning for UTI. Patient was started on empiric antibiotics for possible developing sepsis and was given fluid bolus for sepsis protocol and admitted for further management.  Patient's daughter who went to check on the patient at around 3 AM felt that patient may have had some slurred speech. CT head is unremarkable.  Patient has had a urological procedure 3 days ago. During the patient was given prophylactic antibiotic.  ED Course: Fluid bolus and empiric antibiotics started.  Review of Systems: As per HPI, rest all negative.   Past Medical History:  Diagnosis Date  . Acute bronchitis per pt no fever--  cough since discharge from hospital 07-02-2015   per pcp note 07-05-2015  mild to moderate bronchitis vs pna  . Allergic rhinitis   . Bladder cancer ([place])   . BPH (benign prostatic hyperplasia)   . Bradycardia   . CAD (coronary artery disease)   . CHF (congestive heart failure) ([place])   . Chronic low back pain   .  DDD (degenerative disc disease), lumbosacral   . Depression   . Diverticulosis of colon (without mention of hemorrhage)   . Essential tremor   . Fatty liver disease, nonalcoholic   . GERD (gastroesophageal reflux disease)   . Glaucoma   . History of bladder cancer    2004--  TCC low-grade , non-invasive  . History of colon polyps   . History of exercise stress test    02-05-2007-- ETT (Clinically positive, electrically equivocal, submaximal ETT,  appropriate bp response to exercise  . History of hiatal hernia   . History of kidney stones   . History of peptic ulcer   . HLD (hyperlipidemia)   . HTN (hypertension)   . Nocturia   . Productive cough   . Rheumatoid arthritis(714.0)   . Right renal artery stenosis ([facility])    per cath 03-14-2009 --- 40-50%  . RLS (restless legs syndrome)   . Vertigo, intermittent   . Weak urinary stream   . Wears dentures    upper  . Wears glasses   . Wears hearing aid    bilateral  . Wears partial dentures    lower    Past Surgical History:  Procedure Laterality Date  . CARDIAC CATHETERIZATION  02-12-2007  dr gregg taylor   Non-obstructive CAD,  20% pLCX,  20% LAD, ef 65%  . CARDIAC CATHETERIZATION  03-14-2009  dr Johnsie Cancel   pLAD and mid diaginal 20-30% multiple lesions,  OM1 20%, right renal ostial 40-50%,  ef 60%  . CATARACT EXTRACTION  W/ INTRAOCULAR LENS  IMPLANT, BILATERAL  2016  . CYSTOSCOPY WITH BIOPSY N/A 07/11/2015   Procedure: CYSTOSCOPY WITH COLD CUP RESECTION AND FULGERATION;  Surgeon: Rana Snare, MD;  Location: [facility];  Service: Urology;  Laterality: N/A;  . HIP ARTHROSCOPY W/ LABRAL DEBRIDEMENT Left 06-29-2000   and chondraplasty  . LUMBAR [place] SURGERY  07-16-2005  . LUMBAR LAMINECTOMY/DECOMPRESSION MICRODISCECTOMY  07/09/2011   Procedure: LUMBAR LAMINECTOMY/DECOMPRESSION MICRODISCECTOMY;  Surgeon: Johnn Hai;  Location: [facility];  Service: Orthopedics;  Laterality: Left;  Decompression L5 - S1 on the Left  and repair of dura  . LUMBAR LAMINECTOMY/DECOMPRESSION MICRODISCECTOMY N/A 07/27/2013   Procedure: DECOMPRESSION L4 - L5 WITH EXCISION OF SYNOVIAL CYST AND, LATERAL MASS FUSION 1 LEVEL;  Surgeon: Johnn Hai, MD;  Location: [facility];  Service: Orthopedics;  Laterality: N/A;  . LUMBAR LAMINECTOMY/DECOMPRESSION MICRODISCECTOMY Left 07/06/2014   Procedure:  MICRO LUMBAR DECOMPRESSION L5-S1 ON LEFT, L4-5 REDO;  Surgeon: Johnn Hai, MD;  Location: [facility];  Service: Orthopedics;  Laterality: Left;  . TONSILLECTOMY AND ADENOIDECTOMY    . TRANSTHORACIC ECHOCARDIOGRAM  09-20-2010   normal LVF, ef 55-65%/  mild MR, TR and PR/  mild LAE  . TRANSURETHRAL RESECTION OF BLADDER TUMOR  09-21-2002     reports that he quit smoking about 33 years ago. His smoking use included Cigarettes. He has a 40.00 pack-year smoking history. He has never used smokeless tobacco. He reports that he does not drink alcohol or use drugs.  Allergies  Allergen Reactions  . Morphine And Related Other (See Comments)    Migraine   . Hydrochlorothiazide Other (See Comments)    REACTION: hyponatremia with daily use  . Tape Itching    Patient  Is ok to use paper tape  . Lisinopril Cough  . Sulfa Antibiotics Rash    Family History  Problem Relation Age of Onset  . Heart attack Mother   . Cancer Father     lung  . Cancer Sister     lung  . Diabetes Sister   . Cancer Brother     lung that spread to brain    Prior to Admission medications   Medication Sig Start Date End Date Taking? Authorizing Provider  amLODipine (NORVASC) 5 MG tablet Take 5 mg by mouth daily as needed (for blood pressure).    Yes Historical Provider, MD  brimonidine (ALPHAGAN) 0.15 % ophthalmic solution Place 1 drop into both eyes 2 (two) times daily.   Yes Historical Provider, MD  citalopram (CELEXA) 10 MG tablet Take 1 tablet (10 mg total) by mouth every morning. Patient taking differently: Take 20 mg by mouth at bedtime.  05/17/15  Yes Biagio Borg, MD  clotrimazole (LOTRIMIN) 1 % cream Apply 1 application topically 2 (two) times daily. Prescribed for rash in groin from [place] on 06/20/2014    Yes Historical Provider, MD  fluticasone (FLONASE) 50 MCG/ACT nasal spray USE 2 SPRAYS IN EACH NOSTRIL EVERY DAY AS NEEDED  FOR  RHINITIS/ALLERGIES Patient taking differently: Place 2 sprays into both nostrils every evening.  05/17/15  Yes Biagio Borg, MD  gabapentin (NEURONTIN) 300 MG capsule TAKE 1 TO 2 CAPSULES FOUR TIMES DAILY  AFTER MEALS AND AT BEDTIME. Patient taking differently: TAKE 2 CAPSULES FOUR TIMES DAILY  AFTER MEALS AND AT BEDTIME. 05/05/16  Yes Biagio Borg, MD  hydrochlorothiazide (HYDRODIURIL) 25 MG tablet Take 25 mg by mouth daily.  07/06/15  Yes Historical Provider, MD  hydroxypropyl  methylcellulose / hypromellose (ISOPTO TEARS / GONIOVISC) 2.5 % ophthalmic solution Place 1 drop into both eyes 2 (two) times daily.   Yes Historical Provider, MD  influenza vac recom quadrivalent (FLUBLOK) 0.5 ML injection Inject 0.5 mLs into the muscle once.   Yes Historical Provider, MD  losartan (COZAAR) 50 MG tablet Take 50 mg by mouth daily as needed (for blood pressure). Reported on 09/16/2015   Yes Historical Provider, MD  meclizine (ANTIVERT) 25 MG tablet TAKE 1 TABLET TWO TIMES DAILY AS NEEDED FOR DIZZINESS Patient taking differently: Takes 25mg  by mouth at night 05/05/16  Yes Biagio Borg, MD  nitroGLYCERIN (NITROSTAT) 0.4 MG SL tablet Place 1 tablet (0.4 mg total) under the tongue every 5 (five) minutes as needed. Chest pain 08/26/11  Yes Biagio Borg, MD  oxyCODONE-acetaminophen (PERCOCET) 10-325 MG tablet Take 1 tablet by mouth 2 (two) times daily as needed for pain. 04/22/16  Yes Historical Provider, MD  pantoprazole (PROTONIX) 40 MG tablet Take 40 mg by mouth 2 (two) times daily.  11/29/15  Yes Historical Provider, MD  potassium chloride (K-DUR) 10 MEQ tablet Take 20 mEq by mouth daily.  11/27/15  Yes Historical Provider, MD  primidone  (MYSOLINE) 250 MG tablet Take 0.5 tablets (125 mg total) by mouth 3 (three) times daily. TAKE 1/2 TABLET THREE TIMES DAILY Patient taking differently: Take 125 mg by mouth 3 (three) times daily.  09/18/15  Yes Belkys A Regalado, MD  simvastatin (ZOCOR) 40 MG tablet TAKE 1 AND 1/2 TABLETS AT BEDTIME Patient taking differently: Take 60 mg by mouth at bedtime.  05/17/15  Yes Biagio Borg, MD  tamsulosin (FLOMAX) 0.4 MG CAPS capsule Take 1 capsule (0.4 mg total) by mouth daily. Patient taking differently: Take 0.8 mg by mouth at bedtime.  05/17/15  Yes Biagio Borg, MD  timolol (TIMOPTIC) 0.5 % ophthalmic solution Place 1 drop into both eyes 2 (two) times daily.  05/30/15  Yes Historical Provider, MD  Travoprost, BAK Free, (TRAVATAN Z) 0.004 % SOLN ophthalmic solution Place 1 drop into both eyes at bedtime. 09/14/13  Yes Biagio Borg, MD  triamcinolone (KENALOG) 0.025 % cream Apply 1 application topically 2 (two) times daily.   Yes Historical Provider, MD  aspirin EC 81 MG EC tablet Take 1 tablet (81 mg total) by mouth daily. Patient not taking: Reported on 05/22/2016 09/18/15   Elmarie Shiley, MD  clotrimazole-betamethasone (LOTRISONE) cream Use as directed twice per day as needed Patient not taking: Reported on 05/22/2016 05/04/14   Biagio Borg, MD  sucralfate (CARAFATE) 1 GM/10ML suspension Take 10 mLs (1 g total) by mouth 4 (four) times daily -  with meals and at bedtime. Patient not taking: Reported on 05/22/2016 09/18/15   Elmarie Shiley, MD    Physical Exam: Vitals:   05/22/16 2030 05/22/16 2100 05/22/16 2130 05/22/16 2200  BP: 119/55 (!) 120/52 (!) 124/51 (!) 125/51  Pulse: 61 63 64 65  Resp: 26 26 (!) 28 26  Temp:      TempSrc:      SpO2: 95% 93% 93% 94%  Weight:          Constitutional: Moderately built and nourished. Vitals:   05/22/16 2030 05/22/16 2100 05/22/16 2130 05/22/16 2200  BP: 119/55 (!) 120/52 (!) 124/51 (!) 125/51  Pulse: 61 63 64 65  Resp: 26 26 (!) 28 26  Temp:       TempSrc:      SpO2: 95% 93% 93%  94%  Weight:       Eyes: Anicteric no pallor. ENMT: No discharge from the ears eyes nose and mouth. Neck: No mass felt. No neck rigidity. Respiratory: No rhonchi or crepitations. Cardiovascular: S1-S2 heard. No murmurs appreciated. Abdomen: Soft nontender bowel sounds present. No guarding or rigidity. Musculoskeletal: No edema. No joint effusion. Skin: No rash. Skin appears warm. Neurologic: Alert awake oriented to time place and person. Moves all extremities 5 x 5. No facial asymmetry. Tongue is midline. Psychiatric: Appears normal. Normal affect.   Labs on Admission: I have personally reviewed following labs and imaging studies  CBC:  Recent Labs Lab 05/22/16 1444  WBC 21.2*  HGB 14.0  HCT 40.1  MCV 88.7  PLT 123XX123   Basic Metabolic Panel:  Recent Labs Lab 05/22/16 1444  NA 130*  K 2.7*  CL 97*  CO2 23  GLUCOSE 180*  BUN 19  CREATININE 1.24  CALCIUM 8.7*   GFR: Estimated Creatinine Clearance: 50.6 mL/min (by C-G formula based on SCr of 1.24 mg/dL). Liver Function Tests: No results for input(s): AST, ALT, ALKPHOS, BILITOT, PROT, ALBUMIN in the last 168 hours. No results for input(s): LIPASE, AMYLASE in the last 168 hours. No results for input(s): AMMONIA in the last 168 hours. Coagulation Profile: No results for input(s): INR, PROTIME in the last 168 hours. Cardiac Enzymes: No results for input(s): CKTOTAL, CKMB, CKMBINDEX, TROPONINI in the last 168 hours. BNP (last 3 results) No results for input(s): PROBNP in the last 8760 hours. HbA1C: No results for input(s): HGBA1C in the last 72 hours. CBG:  Recent Labs Lab 05/22/16 1524  GLUCAP 150*   Lipid Profile: No results for input(s): CHOL, HDL, LDLCALC, TRIG, CHOLHDL, LDLDIRECT in the last 72 hours. Thyroid Function Tests: No results for input(s): TSH, T4TOTAL, FREET4, T3FREE, THYROIDAB in the last 72 hours. Anemia Panel: No results for input(s): VITAMINB12, FOLATE,  FERRITIN, TIBC, IRON, RETICCTPCT in the last 72 hours. Urine analysis:    Component Value Date/Time   COLORURINE AMBER (A) 05/22/2016 1934   APPEARANCEUR CLOUDY (A) 05/22/2016 1934   LABSPEC 1.034 (H) 05/22/2016 1934   PHURINE 6.0 05/22/2016 1934   GLUCOSEU NEGATIVE 05/22/2016 1934   GLUCOSEU NEGATIVE 10/28/2012 1542   HGBUR TRACE (A) 05/22/2016 1934   BILIRUBINUR NEGATIVE 05/22/2016 1934   KETONESUR NEGATIVE 05/22/2016 1934   PROTEINUR NEGATIVE 05/22/2016 1934   UROBILINOGEN 0.2 10/28/2012 1542   NITRITE NEGATIVE 05/22/2016 1934   LEUKOCYTESUR SMALL (A) 05/22/2016 1934   Sepsis Labs: @LABRCNTIP (procalcitonin:4,lacticidven:4) ) Recent Results (from the past 240 hour(s))  Culture, blood (Routine X 2) w Reflex to ID Panel     Status: None (Preliminary result)   Collection Time: 05/22/16  3:27 PM  Result Value Ref Range Status   Specimen Description BLOOD LEFT HAND  Final   Special Requests   Final    BOTTLES DRAWN AEROBIC AND ANAEROBIC 5CC Performed at [facility]    Culture PENDING  Incomplete   Report Status PENDING  Incomplete     Radiological Exams on Admission: Dg Chest 2 View  Result Date: 05/22/2016 CLINICAL DATA:  Shortness of breath starting this morning EXAM: CHEST  2 VIEW COMPARISON:  10/17/2015 FINDINGS: Cardiomediastinal silhouette is stable. No segmental infiltrate or pulmonary edema. Elevation of the right hemidiaphragm again noted. Left basilar atelectasis. Osteopenia and mild degenerative changes thoracic spine. IMPRESSION: No segmental infiltrate or pulmonary edema. Left basilar atelectasis. Electronically Signed   By: Lahoma Crocker M.D.   On: 05/22/2016 16:22  Ct Head Wo Contrast  Result Date: 05/22/2016 CLINICAL DATA:  Weakness for 3 days EXAM: CT HEAD WITHOUT CONTRAST TECHNIQUE: Contiguous axial images were obtained from the base of the skull through the vertex without intravenous contrast. COMPARISON:  07/01/2015 FINDINGS: Brain: No evidence of acute  infarction, hemorrhage, hydrocephalus, extra-axial collection or mass lesion/mass effect. There is mild atrophy. Vascular: No hyperdense vessels. Mild carotid artery calcifications. Skull: Mastoid air cells clear. Small sclerotic focus at the right skullbase is unchanged. No fracture. Sinuses/Orbits: Complete opacification of left sphenoid sinus. No acute orbital abnormality. Other: None IMPRESSION: 1. No CT evidence for acute intracranial abnormality. 2. Complete opacification of left sphenoid sinus Electronically Signed   By: Donavan Foil M.D.   On: 05/22/2016 16:49    EKG: Independently reviewed. Normal sinus rhythm with bradycardia and nonspecific ST-T changes in the inferolateral leads.  Assessment/Plan Principal Problem:   Generalized weakness Active Problems:   Malignant neoplasm of bladder ([facility])   RENAL ARTERY STENOSIS   Hypokalemia   Pulmonary hypertension   Essential tremor   UTI (urinary tract infection)   Weakness    1. Generalized weakness probably from developing infection - possible source could be UTI. I have placed patient is on vancomycin and cefepime. Follow urine cultures and blood cultures. Continue hydration. Check lactate levels. Get physical therapy consult. Since there was concerns that patient also had slurred speech and initially was confused and get MRI brain. 2. Periods of dyspnea - patient states at times he gets short of breath. Patient had a 2D ECHO in March of this year which was showing EF of 55-60% and had a normal stress test. Will check troponin (EKG showing nonspecific ST-T changes) and d-dimer. 3. Hypokalemia - probably secondary to diuretics. Patient also had one episode of diarrhea. Replace and recheck. Check magnesium levels. 4. Hypertension - since patient's blood pressure was low normal will hold off antihypertensives for now. 5. Essential tremors continue primidone. 6. Bladder cancer being followed by urologist. 7. Hyperlipidemia on statins -  check CK levels because of weakness.   DVT prophylaxis: Lovenox. Code Status: Full code.  Family Communication: Patient's son and daughter.  Disposition Plan: To be determined.  Consults called: Physical therapy.  Admission status: Observation.    Rise Patience MD [facility] Pager [phone].  If 7PM-7AM, please contact night-coverage [email or web address] Password [email or web address]  05/22/2016, 11:25 PM

## 2016-05-22 NOTE — ED Notes (Signed)
Dr Raliegh Ip notified of pt condition and will come and re-evaluate for bed placement

## 2016-05-22 NOTE — ED Triage Notes (Signed)
Per EMS, pt complains of weakness for the past 3 days. Pt was on prophylactic antibiotics for a bladder procedure on Monday. Pt noticed blood in his urine yesterday. Pt had fever of 100.5 with EMS. Pt given 650 tylenol and 500cc NS en route to hospital

## 2016-05-22 NOTE — ED Notes (Signed)
Notified EDP,Knapp,J. Pt. I-stat CG4 Lactic acid 2.42 and RN,Bobby made aware.

## 2016-05-23 ENCOUNTER — Observation Stay (HOSPITAL_COMMUNITY): Payer: Medicare Other

## 2016-05-23 DIAGNOSIS — E876 Hypokalemia: Secondary | ICD-10-CM

## 2016-05-23 DIAGNOSIS — K219 Gastro-esophageal reflux disease without esophagitis: Secondary | ICD-10-CM | POA: Diagnosis present

## 2016-05-23 DIAGNOSIS — I2510 Atherosclerotic heart disease of native coronary artery without angina pectoris: Secondary | ICD-10-CM | POA: Diagnosis present

## 2016-05-23 DIAGNOSIS — C679 Malignant neoplasm of bladder, unspecified: Secondary | ICD-10-CM | POA: Diagnosis not present

## 2016-05-23 DIAGNOSIS — E785 Hyperlipidemia, unspecified: Secondary | ICD-10-CM | POA: Diagnosis present

## 2016-05-23 DIAGNOSIS — G250 Essential tremor: Secondary | ICD-10-CM

## 2016-05-23 DIAGNOSIS — N400 Enlarged prostate without lower urinary tract symptoms: Secondary | ICD-10-CM | POA: Diagnosis present

## 2016-05-23 DIAGNOSIS — N3000 Acute cystitis without hematuria: Secondary | ICD-10-CM

## 2016-05-23 DIAGNOSIS — E860 Dehydration: Secondary | ICD-10-CM | POA: Diagnosis present

## 2016-05-23 DIAGNOSIS — Z8551 Personal history of malignant neoplasm of bladder: Secondary | ICD-10-CM | POA: Diagnosis not present

## 2016-05-23 DIAGNOSIS — Z79899 Other long term (current) drug therapy: Secondary | ICD-10-CM | POA: Diagnosis not present

## 2016-05-23 DIAGNOSIS — T502X5A Adverse effect of carbonic-anhydrase inhibitors, benzothiadiazides and other diuretics, initial encounter: Secondary | ICD-10-CM | POA: Diagnosis present

## 2016-05-23 DIAGNOSIS — R0600 Dyspnea, unspecified: Secondary | ICD-10-CM | POA: Diagnosis not present

## 2016-05-23 DIAGNOSIS — Z87891 Personal history of nicotine dependence: Secondary | ICD-10-CM | POA: Diagnosis not present

## 2016-05-23 DIAGNOSIS — I10 Essential (primary) hypertension: Secondary | ICD-10-CM | POA: Diagnosis present

## 2016-05-23 DIAGNOSIS — E871 Hypo-osmolality and hyponatremia: Secondary | ICD-10-CM | POA: Diagnosis present

## 2016-05-23 DIAGNOSIS — M069 Rheumatoid arthritis, unspecified: Secondary | ICD-10-CM | POA: Diagnosis present

## 2016-05-23 DIAGNOSIS — I272 Pulmonary hypertension, unspecified: Secondary | ICD-10-CM | POA: Diagnosis present

## 2016-05-23 DIAGNOSIS — H409 Unspecified glaucoma: Secondary | ICD-10-CM | POA: Diagnosis present

## 2016-05-23 DIAGNOSIS — R5381 Other malaise: Secondary | ICD-10-CM | POA: Diagnosis present

## 2016-05-23 DIAGNOSIS — N390 Urinary tract infection, site not specified: Secondary | ICD-10-CM | POA: Diagnosis present

## 2016-05-23 DIAGNOSIS — R531 Weakness: Secondary | ICD-10-CM

## 2016-05-23 DIAGNOSIS — Z7982 Long term (current) use of aspirin: Secondary | ICD-10-CM | POA: Diagnosis not present

## 2016-05-23 DIAGNOSIS — I701 Atherosclerosis of renal artery: Secondary | ICD-10-CM | POA: Diagnosis not present

## 2016-05-23 DIAGNOSIS — F329 Major depressive disorder, single episode, unspecified: Secondary | ICD-10-CM | POA: Diagnosis present

## 2016-05-23 DIAGNOSIS — B965 Pseudomonas (aeruginosa) (mallei) (pseudomallei) as the cause of diseases classified elsewhere: Secondary | ICD-10-CM | POA: Diagnosis present

## 2016-05-23 LAB — GASTROINTESTINAL PANEL BY PCR, STOOL (REPLACES STOOL CULTURE)
Adenovirus F40/41: NOT DETECTED
Astrovirus: NOT DETECTED
CRYPTOSPORIDIUM: NOT DETECTED
Campylobacter species: NOT DETECTED
Cyclospora cayetanensis: NOT DETECTED
ENTEROAGGREGATIVE E COLI (EAEC): NOT DETECTED
Entamoeba histolytica: NOT DETECTED
Enteropathogenic E coli (EPEC): NOT DETECTED
Enterotoxigenic E coli (ETEC): NOT DETECTED
GIARDIA LAMBLIA: NOT DETECTED
NOROVIRUS GI/GII: NOT DETECTED
PLESIMONAS SHIGELLOIDES: NOT DETECTED
ROTAVIRUS A: NOT DETECTED
SALMONELLA SPECIES: NOT DETECTED
SAPOVIRUS (I, II, IV, AND V): NOT DETECTED
SHIGA LIKE TOXIN PRODUCING E COLI (STEC): NOT DETECTED
SHIGELLA/ENTEROINVASIVE E COLI (EIEC): NOT DETECTED
Vibrio cholerae: NOT DETECTED
Vibrio species: NOT DETECTED
YERSINIA ENTEROCOLITICA: NOT DETECTED

## 2016-05-23 LAB — CBC
HCT: 36.4 % — ABNORMAL LOW (ref 39.0–52.0)
HEMOGLOBIN: 12.6 g/dL — AB (ref 13.0–17.0)
MCH: 31 pg (ref 26.0–34.0)
MCHC: 34.6 g/dL (ref 30.0–36.0)
MCV: 89.4 fL (ref 78.0–100.0)
Platelets: 158 10*3/uL (ref 150–400)
RBC: 4.07 MIL/uL — AB (ref 4.22–5.81)
RDW: 13.3 % (ref 11.5–15.5)
WBC: 18.9 10*3/uL — AB (ref 4.0–10.5)

## 2016-05-23 LAB — COMPREHENSIVE METABOLIC PANEL
ALK PHOS: 44 U/L (ref 38–126)
ALT: 20 U/L (ref 17–63)
ANION GAP: 6 (ref 5–15)
AST: 43 U/L — ABNORMAL HIGH (ref 15–41)
Albumin: 3.3 g/dL — ABNORMAL LOW (ref 3.5–5.0)
BUN: 16 mg/dL (ref 6–20)
CALCIUM: 8 mg/dL — AB (ref 8.9–10.3)
CHLORIDE: 103 mmol/L (ref 101–111)
CO2: 23 mmol/L (ref 22–32)
Creatinine, Ser: 0.93 mg/dL (ref 0.61–1.24)
Glucose, Bld: 127 mg/dL — ABNORMAL HIGH (ref 65–99)
Potassium: 3.9 mmol/L (ref 3.5–5.1)
SODIUM: 132 mmol/L — AB (ref 135–145)
Total Bilirubin: 1.7 mg/dL — ABNORMAL HIGH (ref 0.3–1.2)
Total Protein: 5.7 g/dL — ABNORMAL LOW (ref 6.5–8.1)

## 2016-05-23 LAB — CREATININE, SERUM
CREATININE: 1 mg/dL (ref 0.61–1.24)
GFR calc non Af Amer: >60 mL/min (ref >60)

## 2016-05-23 LAB — TROPONIN I
Troponin I: 0.03 ng/mL (ref <0.03)
Troponin I: <0.03 ng/mL (ref <0.03)

## 2016-05-23 LAB — MAGNESIUM: MAGNESIUM: 1.6 mg/dL — AB (ref 1.7–2.4)

## 2016-05-23 LAB — CK: CK TOTAL: 149 U/L (ref 49–397)

## 2016-05-23 LAB — LACTIC ACID, PLASMA
Lactic Acid, Venous: 1.2 mmol/L (ref 0.5–1.9)
Lactic Acid, Venous: 2.3 mmol/L (ref 0.5–1.9)

## 2016-05-23 LAB — CORTISOL: CORTISOL PLASMA: 23 ug/dL

## 2016-05-23 MED ORDER — TECHNETIUM TO 99M ALBUMIN AGGREGATED
4.1000 | Freq: Once | INTRAVENOUS | Status: AC | PRN
  Administered 2016-05-23: 4.1 via INTRAVENOUS

## 2016-05-23 MED ORDER — CITALOPRAM HYDROBROMIDE 20 MG PO TABS
20.0000 mg | ORAL_TABLET | Freq: Every day | ORAL | Status: DC
  Administered 2016-05-23 – 2016-05-24 (×3): 20 mg via ORAL
  Filled 2016-05-23 (×3): qty 1

## 2016-05-23 MED ORDER — OXYCODONE-ACETAMINOPHEN 5-325 MG PO TABS
1.0000 | ORAL_TABLET | Freq: Four times a day (QID) | ORAL | Status: DC | PRN
  Administered 2016-05-23 – 2016-05-24 (×4): 2 via ORAL
  Administered 2016-05-25: 1 via ORAL
  Filled 2016-05-23 (×4): qty 2

## 2016-05-23 MED ORDER — TECHNETIUM TC 99M DIETHYLENETRIAME-PENTAACETIC ACID: 31.1000 | Freq: Once | INTRAVENOUS | Status: DC | PRN

## 2016-05-23 MED ORDER — ACETAMINOPHEN 325 MG PO TABS
650.0000 mg | ORAL_TABLET | Freq: Four times a day (QID) | ORAL | Status: DC | PRN
  Administered 2016-05-23 – 2016-05-24 (×4): 650 mg via ORAL
  Filled 2016-05-23 (×4): qty 2

## 2016-05-23 NOTE — Progress Notes (Signed)
PROGRESS NOTE    [name]  [id number]  DOB: 08/05/1935  DOA: 05/22/2016 PCP: Cathlean Cower, MD Outpatient Specialists:  Hospital course: HPI: [name] is a 80 y.o. male with history of labile hypertension, essential tremor, bladder cancer in remission, depression, hyperlipidemia was brought to the ER after patient was finding it difficult to ambulate because of weakness. Patient states last evening after visiting his wife at the nursing home patient started feeling weak and on reaching home patient was stumbling to get towards his house. This morning patient was unable to ambulate because of weakness. Denies any nausea vomiting or abdominal pain chest pain. Patient states he has been having periods of shortness of breath but on my exam patient is presently not in respiratory distress. He has had one episode of diarrhea in the ER. Patient was having low normal blood pressure in the ER and blood work showed leukocytosis and UA is concerning for UTI. Patient was started on empiric antibiotics for possible developing sepsis and was given fluid bolus for sepsis protocol and admitted for further management.  Assessment & Plan:  1. Generalized weakness probably from developing infection - PT eval pending, patient reports that he is feeling much better today.  possible source could be UTI.  Continue vancomycin and cefepime pending cultures. Follow urine cultures and blood cultures. Continue gentle hydration. lactate levels normal. Since there was concerns that patient also had slurred speech and initially was confused and get MRI brain. 2. Periods of dyspnea - patient states at times he gets short of breath. Patient had a 2D ECHO in March of this year which was showing EF of 55-60% and had a normal stress test. Will check troponin (EKG showing nonspecific ST-T changes) and d-dimer.  V/Q Scan pending.   3. Hypokalemia - probably secondary to diuretics. Improving with hydration with NS.  Patient also  had one episode of diarrhea. Replace and recheck. Check magnesium levels. 4. Hypertension - since patient's blood pressure was low normal will hold off antihypertensives for now. 5. Essential tremors continue primidone. 6. Bladder cancer being followed by urologist. 7. Hyperlipidemia on statins - check CK levels because of weakness.   DVT prophylaxis: Lovenox. Code Status: Full code.  Family Communication: Patient's son and daughter.  Disposition Plan:  Likely home in 1-2 days Consults called: Physical therapy.  Admission status: Observation.  Subjective: Pt reports that he is feeling better this morning.  No speech difficulties.    Objective: Vitals:   05/23/16 0000 05/23/16 0040 05/23/16 0557 05/23/16 0939  BP: (!) 138/57  (!) 129/54   Pulse: 85  (!) 59   Resp: 20  18   Temp: 98.6 F (37 C) 99.1 F (37.3 C) (!) 100.6 F (38.1 C)   TempSrc: Oral Oral Oral   SpO2: 99%  99% 96%  Weight:      Height: 5\' 7"  (1.702 m)       Intake/Output Summary (Last 24 hours) at 05/23/16 1206 Last data filed at 05/23/16 1100  Gross per 24 hour  Intake          1501.67 ml  Output              151 ml  Net          1350.67 ml   Filed Weights   05/22/16 1429  Weight: 86.2 kg (190 lb)    Exam: Eyes: Anicteric no pallor. ENMT: No discharge from the ears eyes nose and mouth. Neck: No mass felt.  No neck rigidity. Respiratory: No rhonchi or crepitations. Cardiovascular: S1-S2 heard. No murmurs appreciated. Abdomen: Soft nontender bowel sounds present. No guarding or rigidity. Musculoskeletal: No edema. No joint effusion. Skin: No rash. Skin appears warm. Neurologic: Alert awake oriented to time place and person. Moves all extremities 5 x 5. No facial asymmetry. Tongue is midline. Psychiatric: Appears normal. Normal affect.  Data Reviewed: Basic Metabolic Panel:  Recent Labs Lab 05/22/16 1444 05/22/16 2337 05/23/16 0503 05/23/16 0506  NA 130*  --   --  132*  K 2.7*  --   --   3.9  CL 97*  --   --  103  CO2 23  --   --  23  GLUCOSE 180*  --   --  127*  BUN 19  --   --  16  CREATININE 1.24 1.00  --  0.93  CALCIUM 8.7*  --   --  8.0*  MG  --   --  1.6*  --    Liver Function Tests:  Recent Labs Lab 05/23/16 0506  AST 43*  ALT 20  ALKPHOS 44  BILITOT 1.7*  PROT 5.7*  ALBUMIN 3.3*   No results for input(s): LIPASE, AMYLASE in the last 168 hours. No results for input(s): AMMONIA in the last 168 hours. CBC:  Recent Labs Lab 05/22/16 1444 05/22/16 2337 05/23/16 0506  WBC 21.2* 20.6* 18.9*  HGB 14.0 13.6 12.6*  HCT 40.1 38.9* 36.4*  MCV 88.7 90.0 89.4  PLT 177 141* 158   Cardiac Enzymes:  Recent Labs Lab 05/22/16 2337 05/23/16 0506 05/23/16 1109  CKTOTAL  --   --  149  TROPONINI <0.03 0.03* <0.03   CBG (last 3)   Recent Labs  05/22/16 1524  GLUCAP 150*   Recent Results (from the past 240 hour(s))  Culture, blood (Routine X 2) w Reflex to ID Panel     Status: None (Preliminary result)   Collection Time: 05/22/16  3:27 PM  Result Value Ref Range Status   Specimen Description BLOOD LEFT HAND  Final   Special Requests   Final    BOTTLES DRAWN AEROBIC AND ANAEROBIC 5CC Performed at [facility]    Culture PENDING  Incomplete   Report Status PENDING  Incomplete  Gastrointestinal Panel by PCR , Stool     Status: None   Collection Time: 05/23/16  6:30 AM  Result Value Ref Range Status   Campylobacter species NOT DETECTED NOT DETECTED Final   Plesimonas shigelloides NOT DETECTED NOT DETECTED Final   Salmonella species NOT DETECTED NOT DETECTED Final   Yersinia enterocolitica NOT DETECTED NOT DETECTED Final   Vibrio species NOT DETECTED NOT DETECTED Final   Vibrio cholerae NOT DETECTED NOT DETECTED Final   Enteroaggregative E coli (EAEC) NOT DETECTED NOT DETECTED Final   Enteropathogenic E coli (EPEC) NOT DETECTED NOT DETECTED Final   Enterotoxigenic E coli (ETEC) NOT DETECTED NOT DETECTED Final   Shiga like toxin producing E  coli (STEC) NOT DETECTED NOT DETECTED Final   Shigella/Enteroinvasive E coli (EIEC) NOT DETECTED NOT DETECTED Final   Cryptosporidium NOT DETECTED NOT DETECTED Final   Cyclospora cayetanensis NOT DETECTED NOT DETECTED Final   Entamoeba histolytica NOT DETECTED NOT DETECTED Final   Giardia lamblia NOT DETECTED NOT DETECTED Final   Adenovirus F40/41 NOT DETECTED NOT DETECTED Final   Astrovirus NOT DETECTED NOT DETECTED Final   Norovirus GI/GII NOT DETECTED NOT DETECTED Final   Rotavirus A NOT DETECTED NOT DETECTED Final  Sapovirus (I, II, IV, and V) NOT DETECTED NOT DETECTED Final     Studies: Dg Chest 2 View  Result Date: 05/22/2016 CLINICAL DATA:  Shortness of breath starting this morning EXAM: CHEST  2 VIEW COMPARISON:  10/17/2015 FINDINGS: Cardiomediastinal silhouette is stable. No segmental infiltrate or pulmonary edema. Elevation of the right hemidiaphragm again noted. Left basilar atelectasis. Osteopenia and mild degenerative changes thoracic spine. IMPRESSION: No segmental infiltrate or pulmonary edema. Left basilar atelectasis. Electronically Signed   By: Lahoma Crocker M.D.   On: 05/22/2016 16:22   Ct Head Wo Contrast  Result Date: 05/22/2016 CLINICAL DATA:  Weakness for 3 days EXAM: CT HEAD WITHOUT CONTRAST TECHNIQUE: Contiguous axial images were obtained from the base of the skull through the vertex without intravenous contrast. COMPARISON:  07/01/2015 FINDINGS: Brain: No evidence of acute infarction, hemorrhage, hydrocephalus, extra-axial collection or mass lesion/mass effect. There is mild atrophy. Vascular: No hyperdense vessels. Mild carotid artery calcifications. Skull: Mastoid air cells clear. Small sclerotic focus at the right skullbase is unchanged. No fracture. Sinuses/Orbits: Complete opacification of left sphenoid sinus. No acute orbital abnormality. Other: None IMPRESSION: 1. No CT evidence for acute intracranial abnormality. 2. Complete opacification of left sphenoid sinus  Electronically Signed   By: Donavan Foil M.D.   On: 05/22/2016 16:49   Scheduled Meds: . brimonidine  1 drop Both Eyes BID  . citalopram  20 mg Oral QHS  . enoxaparin (LOVENOX) injection  40 mg Subcutaneous QHS  . fluticasone  2 spray Each Nare QPM  . gabapentin  300 mg Oral TID  . latanoprost  1 drop Both Eyes QHS  . meclizine  25 mg Oral QHS  . pantoprazole  40 mg Oral BID  . piperacillin-tazobactam (ZOSYN)  IV  3.375 g Intravenous Q8H  . potassium chloride SA  20 mEq Oral Daily  . primidone  125 mg Oral TID  . simvastatin  60 mg Oral QHS  . tamsulosin  0.8 mg Oral QHS  . timolol  1 drop Both Eyes BID  . vancomycin  750 mg Intravenous Q12H   Continuous Infusions: . sodium chloride 100 mL/hr at 05/23/16 0041   Principal Problem:   Generalized weakness Active Problems:   Malignant neoplasm of bladder (HCC)   RENAL ARTERY STENOSIS   Hypokalemia   Pulmonary hypertension   Essential tremor   UTI (urinary tract infection)   Weakness  Time spent:   Irwin Brakeman, MD, FAAFP [facility] Pager [phone]  If 7PM-7AM, please contact night-coverage [email or web address] Password [email or web address] 05/23/2016, 12:06 PM    LOS: 0 days

## 2016-05-23 NOTE — Care Management Note (Signed)
Case Management Note  Patient Details  Name: [name] MRN: [id number] Date of Birth: [birth year]  Subjective/Objective:  80 y/o m admitted w/Generalizxed weakness. PT-no f/u. No further CM needs.                  Action/Plan:d/c home.   Expected Discharge Date:   (unknown)               Expected Discharge Plan:  Home/Self Care  In-House Referral:     Discharge planning Services  CM Consult  Post Acute Care Choice:    Choice offered to:     DME Arranged:    DME Agency:     HH Arranged:    HH Agency:     Status of Service:  In process, will continue to follow  If discussed at Long Length of Stay Meetings, dates discussed:    Additional Comments:  Dessa Phi, RN 05/23/2016, 1:56 PM

## 2016-05-23 NOTE — Evaluation (Addendum)
Physical Therapy Evaluation Patient Details Name: [name] MRN: [id number] DOB: September 15, 1935 Today's Date: 05/23/2016   History of Present Illness  80 y.o. male with history of labile hypertension, essential tremor, bladder cancer in remission, depression, hyperlipidemia and admitted for generalized weakness  Clinical Impression  Pt admitted with above diagnosis. Pt currently with functional limitations due to the deficits listed below (see PT Problem List). Pt will benefit from skilled PT to increase their independence and safety with mobility to allow discharge to the venue listed below.  Pt mobilizing well with RW and reports feeling much better since admission.  Daughter present and states pt appears much improved as well.     Follow Up Recommendations No PT follow up    Equipment Recommendations  None recommended by PT    Recommendations for Other Services       Precautions / Restrictions Precautions Precautions: Fall      Mobility  Bed Mobility               General bed mobility comments: pt sitting EOB on arrival  Transfers Overall transfer level: Needs assistance Equipment used: Rolling walker (2 wheeled) Transfers: Sit to/from Stand Sit to Stand: Min guard         General transfer comment: recalls correct technique from having to assist spouse  Ambulation/Gait Ambulation/Gait assistance: Min guard Ambulation Distance (Feet): 130 Feet Assistive device: Rolling walker (2 wheeled) Gait Pattern/deviations: Step-through pattern;Decreased stride length     General Gait Details: uses RW well, steady with RW, distance per tolerance, denies any symptoms  Stairs            Wheelchair Mobility    Modified Rankin (Stroke Patients Only)       Balance                                             Pertinent Vitals/Pain Pain Assessment: No/denies pain    Home Living Family/patient expects to be discharged to:: Private  residence Living Arrangements: Spouse/significant other Available Help at Discharge: Family Type of Home: House Home Access: Stairs to enter Entrance Stairs-Rails: None Entrance Stairs-Number of Steps: 1 Home Layout: One level Home Equipment: Environmental consultant - 2 wheels;Bedside commode Additional Comments: spouse currently in SNF for rehab    Prior Function Level of Independence: Independent         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     Hand Dominance        Extremity/Trunk Assessment               Lower Extremity Assessment: Generalized weakness         Communication   Communication: No difficulties  Cognition Arousal/Alertness: Awake/alert Behavior During Therapy: WFL for tasks assessed/performed Overall Cognitive Status: Within Functional Limits for tasks assessed                      General Comments      Exercises     Assessment/Plan    PT Assessment Patient needs continued PT services  PT Problem List Decreased strength;Decreased activity tolerance;Decreased mobility          PT Treatment Interventions DME instruction;Gait training;Therapeutic exercise;Therapeutic activities;Functional mobility training    PT Goals (Current goals can be found in the Care Plan section)  Acute Rehab PT Goals PT Goal Formulation: With patient/family Time For Goal Achievement: 05/30/16 Potential  to Achieve Goals: Good    Frequency Min 3X/week   Barriers to discharge        Co-evaluation               End of Session Equipment Utilized During Treatment: Gait belt Activity Tolerance: Patient tolerated treatment well Patient left:  (w/c with transporter) Nurse Communication: Mobility status    Functional Assessment Tool Used: clinical judgement Functional Limitation: Mobility: Walking and moving around Mobility: Walking and Moving Around Current Status [id number]): At least 1 percent but less than 20 percent impaired, limited or restricted Mobility:  Walking and Moving Around Goal Status [phone]): 0 percent impaired, limited or restricted    Time: 1120-1145 PT Time Calculation (min) (ACUTE ONLY): 25 min   Charges:   PT Evaluation $PT Eval Low Complexity: 1 Procedure     PT G Codes:   PT G-Codes **NOT FOR INPATIENT CLASS** Functional Assessment Tool Used: clinical judgement Functional Limitation: Mobility: Walking and moving around Mobility: Walking and Moving Around Current Status [id number]): At least 1 percent but less than 20 percent impaired, limited or restricted Mobility: Walking and Moving Around Goal Status [phone]): 0 percent impaired, limited or restricted    Katheleen Stella,KATHrine E 05/23/2016, 12:59 PM Carmelia Bake, PT, DPT 05/23/2016 Pager: [phone]  Pt with pending tests however per rounding, RN states MD wanted PT to evaluate.

## 2016-05-23 NOTE — Care Management Obs Status (Signed)
[facility] NOTIFICATION   Patient Details  Name: ANGUS HENDRIE MRN: [id number] Date of Birth: Sep 13, 1935   Medicare Observation Status Notification Given:  Yes    MahabirJuliann Pulse, RN 05/23/2016, 2:58 PM

## 2016-05-23 NOTE — Progress Notes (Signed)
Lactic acid 2.3, MD paged. No new orders at this time, will carry out any new orders given.

## 2016-05-24 LAB — CBC WITH DIFFERENTIAL/PLATELET
BASOS ABS: 0 10*3/uL (ref 0.0–0.1)
BASOS PCT: 0 %
EOS ABS: 0 10*3/uL (ref 0.0–0.7)
EOS PCT: 0 %
HCT: 36.1 % — ABNORMAL LOW (ref 39.0–52.0)
Hemoglobin: 12.2 g/dL — ABNORMAL LOW (ref 13.0–17.0)
Lymphocytes Relative: 6 %
Lymphs Abs: 0.8 10*3/uL (ref 0.7–4.0)
MCH: 30.7 pg (ref 26.0–34.0)
MCHC: 33.8 g/dL (ref 30.0–36.0)
MCV: 90.7 fL (ref 78.0–100.0)
MONO ABS: 1.1 10*3/uL — AB (ref 0.1–1.0)
Monocytes Relative: 8 %
Neutro Abs: 11.8 10*3/uL — ABNORMAL HIGH (ref 1.7–7.7)
Neutrophils Relative %: 86 %
PLATELETS: 158 10*3/uL (ref 150–400)
RBC: 3.98 MIL/uL — ABNORMAL LOW (ref 4.22–5.81)
RDW: 13.4 % (ref 11.5–15.5)
WBC: 13.7 10*3/uL — ABNORMAL HIGH (ref 4.0–10.5)

## 2016-05-24 LAB — BASIC METABOLIC PANEL
ANION GAP: 6 (ref 5–15)
BUN: 12 mg/dL (ref 6–20)
CALCIUM: 8.3 mg/dL — AB (ref 8.9–10.3)
CO2: 24 mmol/L (ref 22–32)
CREATININE: 0.78 mg/dL (ref 0.61–1.24)
Chloride: 103 mmol/L (ref 101–111)
Glucose, Bld: 114 mg/dL — ABNORMAL HIGH (ref 65–99)
Potassium: 3.2 mmol/L — ABNORMAL LOW (ref 3.5–5.1)
Sodium: 133 mmol/L — ABNORMAL LOW (ref 135–145)

## 2016-05-24 MED ORDER — POTASSIUM CHLORIDE CRYS ER 20 MEQ PO TBCR
40.0000 meq | EXTENDED_RELEASE_TABLET | Freq: Once | ORAL | Status: AC
  Administered 2016-05-24: 40 meq via ORAL
  Filled 2016-05-24: qty 2

## 2016-05-24 NOTE — Progress Notes (Signed)
PROGRESS NOTE    [name]  [id number]  DOB: 1935-10-08  DOA: 05/22/2016 PCP: Cathlean Cower, MD Outpatient Specialists:  Hospital course: HPI: [name] is a 80 y.o. male with history of labile hypertension, essential tremor, bladder cancer in remission, depression, hyperlipidemia was brought to the ER after patient was finding it difficult to ambulate because of weakness. Patient states last evening after visiting his wife at the nursing home patient started feeling weak and on reaching home patient was stumbling to get towards his house. This morning patient was unable to ambulate because of weakness. Denies any nausea vomiting or abdominal pain chest pain. Patient states he has been having periods of shortness of breath but on my exam patient is presently not in respiratory distress. He has had one episode of diarrhea in the ER. Patient was having low normal blood pressure in the ER and blood work showed leukocytosis and UA is concerning for UTI. Patient was started on empiric antibiotics for possible developing sepsis and was given fluid bolus for sepsis protocol and admitted for further management.  Assessment & Plan:  1. Generalized weakness probably from developing infection - PT eval appeciated and no further recommendations - see report , patient reports that he is feeling tired today.  Likely due to UTI.  Follow urine cultures and blood cultures. Continue gentle hydration. lactate levels normal. Since there was concerns that patient also had slurred speech and initially was confused and get MRI brain. 2. Periods of dyspnea - patient states at times he gets short of breath. Patient had a 2D ECHO in March of this year which was showing EF of 55-60% and had a normal stress test. Will check troponin (EKG showing nonspecific ST-T changes) and d-dimer.  V/Q Scan no acute PE.   3. Hypokalemia - probably secondary to diuretics. Improving with hydration with NS.  Patient also had one episode  of diarrhea. Replace and recheck. magnesium levels ok. 4. Hypertension - since patient's blood pressure was low normal will hold off antihypertensives for now. 5. Essential tremors continue primidone. 6. Bladder cancer being followed by urologist. 7. Hyperlipidemia on statins - check CK levels because of weakness.   DVT prophylaxis: Lovenox. Code Status: Full code.  Family Communication: Patient's son and daughter.  Disposition Plan:  Likely home in 1-2 days Consults called: Physical therapy.  Admission status: Observation.  Subjective: Pt reports that he is feeling tired and not as energetic as yesterday.    Objective: Vitals:   05/23/16 1400 05/23/16 1900 05/23/16 2100 05/24/16 0517  BP: (!) 130/51 124/60  (!) 148/63  Pulse: 61  (!) 56 (!) 58  Resp: 18 18  20   Temp: 98.7 F (37.1 C)  98.3 F (36.8 C) 98 F (36.7 C)  TempSrc: Oral  Oral Oral  SpO2: 96% 97%  98%  Weight:      Height:        Intake/Output Summary (Last 24 hours) at 05/24/16 0940 Last data filed at 05/23/16 2200  Gross per 24 hour  Intake             2000 ml  Output              500 ml  Net             1500 ml   Filed Weights   05/22/16 1429  Weight: 86.2 kg (190 lb)    Exam: Eyes: Anicteric no pallor. ENMT: No discharge from the ears eyes nose and  mouth. Neck: No mass felt. No neck rigidity. Respiratory: No rhonchi or crepitations. Cardiovascular: S1-S2 heard. No murmurs appreciated. Abdomen: Soft nontender bowel sounds present. No guarding or rigidity. Musculoskeletal: No edema. No joint effusion. Skin: No rash. Skin appears warm. Neurologic: Alert awake oriented to time place and person. Moves all extremities 5 x 5. No facial asymmetry. Tongue is midline. Psychiatric: Appears normal. Normal affect.  Data Reviewed: Basic Metabolic Panel:  Recent Labs Lab 05/22/16 1444 05/22/16 2337 05/23/16 0503 05/23/16 0506  NA 130*  --   --  132*  K 2.7*  --   --  3.9  CL 97*  --   --  103    CO2 23  --   --  23  GLUCOSE 180*  --   --  127*  BUN 19  --   --  16  CREATININE 1.24 1.00  --  0.93  CALCIUM 8.7*  --   --  8.0*  MG  --   --  1.6*  --    Liver Function Tests:  Recent Labs Lab 05/23/16 0506  AST 43*  ALT 20  ALKPHOS 44  BILITOT 1.7*  PROT 5.7*  ALBUMIN 3.3*   No results for input(s): LIPASE, AMYLASE in the last 168 hours. No results for input(s): AMMONIA in the last 168 hours. CBC:  Recent Labs Lab 05/22/16 1444 05/22/16 2337 05/23/16 0506  WBC 21.2* 20.6* 18.9*  HGB 14.0 13.6 12.6*  HCT 40.1 38.9* 36.4*  MCV 88.7 90.0 89.4  PLT 177 141* 158   Cardiac Enzymes:  Recent Labs Lab 05/22/16 2337 05/23/16 0506 05/23/16 1109  CKTOTAL  --   --  149  TROPONINI <0.03 0.03* <0.03   CBG (last 3)   Recent Labs  05/22/16 1524  GLUCAP 150*   Recent Results (from the past 240 hour(s))  Culture, blood (Routine X 2) w Reflex to ID Panel     Status: None (Preliminary result)   Collection Time: 05/22/16  3:21 PM  Result Value Ref Range Status   Specimen Description BLOOD RIGHT HAND  Final   Special Requests BOTTLES DRAWN AEROBIC AND ANAEROBIC 8CC  Final   Culture   Final    NO GROWTH 2 DAYS Performed at [facility]    Report Status PENDING  Incomplete  Culture, blood (Routine X 2) w Reflex to ID Panel     Status: None (Preliminary result)   Collection Time: 05/22/16  3:27 PM  Result Value Ref Range Status   Specimen Description BLOOD LEFT HAND  Final   Special Requests BOTTLES DRAWN AEROBIC AND ANAEROBIC 5CC  Final   Culture   Final    NO GROWTH 2 DAYS Performed at [facility]    Report Status PENDING  Incomplete  Urine culture     Status: Abnormal (Preliminary result)   Collection Time: 05/22/16  7:34 PM  Result Value Ref Range Status   Specimen Description URINE, CATHETERIZED  Final   Special Requests NONE  Final   Culture (A)  Final    >=100,000 COLONIES/mL PSEUDOMONAS AERUGINOSA SUSCEPTIBILITIES TO FOLLOW Performed  at [facility]    Report Status PENDING  Incomplete  Gastrointestinal Panel by PCR , Stool     Status: None   Collection Time: 05/23/16  6:30 AM  Result Value Ref Range Status   Campylobacter species NOT DETECTED NOT DETECTED Final   Plesimonas shigelloides NOT DETECTED NOT DETECTED Final   Salmonella species NOT DETECTED NOT DETECTED  Final   Yersinia enterocolitica NOT DETECTED NOT DETECTED Final   Vibrio species NOT DETECTED NOT DETECTED Final   Vibrio cholerae NOT DETECTED NOT DETECTED Final   Enteroaggregative E coli (EAEC) NOT DETECTED NOT DETECTED Final   Enteropathogenic E coli (EPEC) NOT DETECTED NOT DETECTED Final   Enterotoxigenic E coli (ETEC) NOT DETECTED NOT DETECTED Final   Shiga like toxin producing E coli (STEC) NOT DETECTED NOT DETECTED Final   Shigella/Enteroinvasive E coli (EIEC) NOT DETECTED NOT DETECTED Final   Cryptosporidium NOT DETECTED NOT DETECTED Final   Cyclospora cayetanensis NOT DETECTED NOT DETECTED Final   Entamoeba histolytica NOT DETECTED NOT DETECTED Final   Giardia lamblia NOT DETECTED NOT DETECTED Final   Adenovirus F40/41 NOT DETECTED NOT DETECTED Final   Astrovirus NOT DETECTED NOT DETECTED Final   Norovirus GI/GII NOT DETECTED NOT DETECTED Final   Rotavirus A NOT DETECTED NOT DETECTED Final   Sapovirus (I, II, IV, and V) NOT DETECTED NOT DETECTED Final     Studies: Dg Chest 2 View  Result Date: 05/22/2016 CLINICAL DATA:  Shortness of breath starting this morning EXAM: CHEST  2 VIEW COMPARISON:  10/17/2015 FINDINGS: Cardiomediastinal silhouette is stable. No segmental infiltrate or pulmonary edema. Elevation of the right hemidiaphragm again noted. Left basilar atelectasis. Osteopenia and mild degenerative changes thoracic spine. IMPRESSION: No segmental infiltrate or pulmonary edema. Left basilar atelectasis. Electronically Signed   By: Lahoma Crocker M.D.   On: 05/22/2016 16:22   Ct Head Wo Contrast  Result Date: 05/22/2016 CLINICAL DATA:   Weakness for 3 days EXAM: CT HEAD WITHOUT CONTRAST TECHNIQUE: Contiguous axial images were obtained from the base of the skull through the vertex without intravenous contrast. COMPARISON:  07/01/2015 FINDINGS: Brain: No evidence of acute infarction, hemorrhage, hydrocephalus, extra-axial collection or mass lesion/mass effect. There is mild atrophy. Vascular: No hyperdense vessels. Mild carotid artery calcifications. Skull: Mastoid air cells clear. Small sclerotic focus at the right skullbase is unchanged. No fracture. Sinuses/Orbits: Complete opacification of left sphenoid sinus. No acute orbital abnormality. Other: None IMPRESSION: 1. No CT evidence for acute intracranial abnormality. 2. Complete opacification of left sphenoid sinus Electronically Signed   By: Donavan Foil M.D.   On: 05/22/2016 16:49   Mr Brain Wo Contrast  Result Date: 05/23/2016 CLINICAL DATA:  Difficulty ambulating because of extremity weakness. History of bladder cancer. History of hypertension. EXAM: MRI HEAD WITHOUT CONTRAST TECHNIQUE: Multiplanar, multiecho pulse sequences of the brain and surrounding structures were obtained without intravenous contrast. COMPARISON:  CT 05/22/2016.  MRI 07/01/2015 FINDINGS: Brain: Diffusion imaging does not show any acute or subacute infarction. The brainstem is normal. No cerebellar abnormality. Cerebral hemispheres show mild chronic small-vessel ischemic changes of the white matter. No cortical or large vessel territory infarction. No mass lesion, hemorrhage, hydrocephalus or extra-axial collection. No pituitary mass. Vascular: Major vessels at the base of the brain show flow. Skull and upper cervical spine: Negative Sinuses/Orbits: Opacified left division of the sphenoid sinus. Small amount of mastoid fluid on the left. Other: None significant IMPRESSION: No acute brain finding. Mild chronic small-vessel change of the cerebral hemispheric white matter, less than often seen in healthy individuals  of this age. Opacification of the left division of the sphenoid sinus. Electronically Signed   By: Nelson Chimes M.D.   On: 05/23/2016 17:13   Nm Pulmonary Perf And Vent  Result Date: 05/23/2016 CLINICAL DATA:  Intermittent dyspnea, chronic. EXAM: NUCLEAR MEDICINE VENTILATION - PERFUSION LUNG SCAN TECHNIQUE: Ventilation images were obtained in  multiple projections using inhaled aerosol Tc-20m DTPA. Perfusion images were obtained in multiple projections after intravenous injection of Tc-38m MAA. RADIOPHARMACEUTICALS:  31.1 mCi Technetium-53m DTPA aerosol inhalation and 4.1 mCi Technetium-55m MAA IV COMPARISON:  PA and lateral chest 05/22/2016. FINDINGS: Ventilation: No focal defect is identified. Radiotracer distribution is mildly heterogeneous. Perfusion: Technically limited without obvious abnormality. IMPRESSION: Very low probability for pulmonary embolus. Electronically Signed   By: Inge Rise M.D.   On: 05/23/2016 13:12   Scheduled Meds: . brimonidine  1 drop Both Eyes BID  . citalopram  20 mg Oral QHS  . enoxaparin (LOVENOX) injection  40 mg Subcutaneous QHS  . fluticasone  2 spray Each Nare QPM  . gabapentin  300 mg Oral TID  . latanoprost  1 drop Both Eyes QHS  . meclizine  25 mg Oral QHS  . pantoprazole  40 mg Oral BID  . piperacillin-tazobactam (ZOSYN)  IV  3.375 g Intravenous Q8H  . potassium chloride SA  20 mEq Oral Daily  . primidone  125 mg Oral TID  . simvastatin  60 mg Oral QHS  . tamsulosin  0.8 mg Oral QHS  . timolol  1 drop Both Eyes BID  . vancomycin  750 mg Intravenous Q12H   Continuous Infusions:  Principal Problem:   Generalized weakness Active Problems:   Malignant neoplasm of bladder (HCC)   RENAL ARTERY STENOSIS   Hypokalemia   Pulmonary hypertension   Essential tremor   UTI (urinary tract infection)   Weakness  Time spent:   Irwin Brakeman, MD, FAAFP [facility] Pager [phone]  If 7PM-7AM, please contact  night-coverage [email or web address] Password [email or web address] 05/24/2016, 9:40 AM    LOS: 1 day

## 2016-05-24 NOTE — Progress Notes (Signed)
2 drops Peppermint EO placed on gauze in paper med cup and placed at bedside for aromatherapy at patient's agreement at 0911. 2 heat packs placed on lower back over patient's hospital gown at 0911. Patient states pain now 4/10.

## 2016-05-24 NOTE — Progress Notes (Signed)
Pharmacy Antibiotic Note  [name] is a 80 y.o. male admitted on 05/22/2016 with sepsis.  Pharmacy has been consulted for Vanc/Zosyn dosing.  Plan:  Check vancomycin trough tonight prior to 5th total dose  Consider stopping vancomycin since cultures only showing Pseudomonas in the urine  Continue Zosyn 3.375gm IV q8h (4hr extended infusions)  F/u renal function, cultures, duration of therapy  Height: 5\' 7"  (170.2 cm) Weight: 190 lb (86.2 kg) IBW/kg (Calculated) : 66.1  Temp (24hrs), Avg:98.3 F (36.8 C), Min:98 F (36.7 C), Max:98.7 F (37.1 C)   Recent Labs Lab 05/22/16 1444 05/22/16 1539 05/22/16 1919 05/22/16 2337 05/23/16 0204 05/23/16 0506  WBC 21.2*  --   --  20.6*  --  18.9*  CREATININE 1.24  --   --  1.00  --  0.93  LATICACIDVEN  --  3.08* 2.42* 2.3* 1.2  --     Estimated Creatinine Clearance: 68.6 mL/min (by C-G formula based on SCr of 0.93 mg/dL).    Allergies  Allergen Reactions  . Morphine And Related Other (See Comments)    Migraine   . Hydrochlorothiazide Other (See Comments)    REACTION: hyponatremia with daily use  . Tape Itching    Patient  Is ok to use paper tape  . Lisinopril Cough  . Sulfa Antibiotics Rash   Antimicrobials this admission:  11/9 vancomycin >>  11/9 Zosyn >>   Dose adjustments this admission:  ---  Microbiology results:  11/9 BCx: ngtd 11/9 UCx: >100k Pseudomonas aeruginosa 11/10 GI PCR negative  Thank you for allowing pharmacy to be a part of this patient's care.   Peggyann Juba, PharmD, BCPS Pager: [phone] 05/24/2016 9:53 AM

## 2016-05-25 DIAGNOSIS — I701 Atherosclerosis of renal artery: Secondary | ICD-10-CM

## 2016-05-25 LAB — URINE CULTURE: Culture: >=100000 — AB

## 2016-05-25 MED ORDER — CIPROFLOXACIN HCL 500 MG PO TABS: 500.0000 mg | ORAL_TABLET | Freq: Two times a day (BID) | ORAL | 0 refills | Status: AC

## 2016-05-25 MED ORDER — CIPROFLOXACIN HCL 500 MG PO TABS
500.0000 mg | ORAL_TABLET | Freq: Two times a day (BID) | ORAL | Status: DC
  Administered 2016-05-25: 500 mg via ORAL
  Filled 2016-05-25: qty 1

## 2016-05-25 MED ORDER — HYDROCHLOROTHIAZIDE 12.5 MG PO TABS
12.5000 mg | ORAL_TABLET | Freq: Every day | ORAL | 0 refills | Status: DC
Start: 2016-05-25 — End: 2016-08-14

## 2016-05-25 MED ORDER — POTASSIUM CHLORIDE CRYS ER 20 MEQ PO TBCR
40.0000 meq | EXTENDED_RELEASE_TABLET | Freq: Once | ORAL | Status: AC
  Administered 2016-05-25: 40 meq via ORAL
  Filled 2016-05-25: qty 2

## 2016-05-25 MED ORDER — POTASSIUM CHLORIDE ER 20 MEQ PO TBCR: 20.0000 meq | EXTENDED_RELEASE_TABLET | Freq: Every day | ORAL | 0 refills | Status: DC

## 2016-05-25 NOTE — Care Management Note (Addendum)
Case Management Note  Patient Details  Name: [name] MRN: [id number] Date of Birth: 05-Mar-1936  Subjective/Objective:  Generalized weakness, UTI, dyspnea                  Action/Plan: Discharge Planning: AVS reviewed:   NCM spoke to dtrLattie [place] # [phone]. Offered choice for [facility]. States her mother used [facility] in the past. Contacted [facility] with new referral. Dtr states pt has RW and bedside commode at home.  Unit RN to write [facility] contact information on dc paperwork.   PCP Biagio Borg MD   Expected Discharge Date:  05/25/2016              Expected Discharge Plan:  [place]  In-House Referral:  NA  Discharge planning Services  CM Consult  Post Acute Care Choice:  Home Health Choice offered to:  Adult Children  DME Arranged:  N/A DME Agency:  NA  HH Arranged:  PT HH Agency:  [facility]  Status of Service:  Completed, signed off  If discussed at [employer], dates discussed:    Additional Comments:  Erenest Rasher, RN 05/25/2016, 4:38 PM

## 2016-05-25 NOTE — Discharge Summary (Signed)
Physician Discharge Summary  [name] [id number] DOB: Nov 25, 1935 DOA: 05/22/2016  PCP: Cathlean Cower, MD  Admit date: 05/22/2016 Discharge date: 05/25/2016  Admitted From: Home  Disposition: Home   Recommendations for Outpatient Follow-up:  1. Follow up with PCP in 1-2 weeks 2. Please obtain BMP/CBC in one week 3. Please follow up on the following pending results:  Discharge Condition: STABLE CODE STATUS: FULL  Brief/Interim Summary: Hospital course: HPI: [name] a 80 y.o.malewith history of labile hypertension, essential tremor, bladder cancer in remission, depression, hyperlipidemia was brought to the ER after patient was finding it difficult to ambulate because of weakness. Patient states last evening after visiting his wife at the nursing home patient started feeling weak and onreaching home patient was stumbling to get towards his house. This morning patient was unable to ambulate because of weakness. Denies any nausea vomiting or abdominal pain chest pain. Patient states he has been having periods of shortness of breath but on my exam patient is presently not in respiratory distress. He has had one episode of diarrhea in the ER. Patient was having low normal blood pressure in the ER and blood work showed leukocytosis and UA is concerning for UTI. Patient was started on empiric antibiotics for possible developing sepsis and was given fluid bolus for sepsis protocol and admitted for further management.  Assessment & Plan:  1. Generalized weaknessprobably from developing infection and deconditioning - PT eval ordered prior to discharge - see report, patient reports that he is feeling tired but overall doing much better.  Likely due to UTI.  urine cultures and blood cultures. Continue gentle hydration. lactate levels normal. Since there was concerns that patient also had slurred speech and initially was confused and get MRI brain. 2. Pseudomonas UTI - home with 10 more days  of ciprofloxacin which is highly sensitive per urine culture.  3. Periods of dyspnea- patient states at times he gets short of breath. Patient had a 2D [facility] March of this year which was showing EF of 55-60% and had a normal stress test. Will check troponin (EKG showing nonspecific ST-T changes) and d-dimer.  V/Q Scan no acute PE.   4. Hypokalemia- probably secondary to diuretics. Improving with hydration with NS with K supplementation.  Patient also had one episode of diarrhea. Replace and recheck. magnesium levels ok.  Reduced dose of HCTZ to 12.5 mg. 5. Hypertension- since patient's blood pressure was low normal on admission did held antihypertensives but resume them at discharge as blood pressures have improved after hydration.   Low BP on admission secondary to severe dehydration.   6. Essential tremorscontinue primidone. 7. Bladder cancerbeing followed by urologist. 8. Hyperlipidemia on statins - normal CK levels tested.   DVT prophylaxis:Lovenox. Code Status:Full code. Family Communication:Patient's son and daughter. Disposition Plan: Home  Consults called:Physical therapy.  Discharge Diagnoses:  Principal Problem:   Generalized weakness Active Problems:   Malignant neoplasm of bladder (HCC)   RENAL ARTERY STENOSIS   Hypokalemia   Pulmonary hypertension   Essential tremor   UTI (urinary tract infection)   Weakness  Discharge Instructions  Discharge Instructions    Increase activity slowly    Complete by:  As directed        Medication List    STOP taking these medications   aspirin 81 MG EC tablet   clotrimazole-betamethasone cream Commonly known as:  LOTRISONE   sucralfate 1 GM/10ML suspension Commonly known as:  CARAFATE     TAKE these medications  amLODipine 5 MG tablet Commonly known as:  NORVASC Take 5 mg by mouth daily as needed (for blood pressure).   brimonidine 0.15 % ophthalmic solution Commonly known as:  ALPHAGAN Place 1 drop  into both eyes 2 (two) times daily.   ciprofloxacin 500 MG tablet Commonly known as:  CIPRO Take 1 tablet (500 mg total) by mouth 2 (two) times daily.   citalopram 10 MG tablet Commonly known as:  CELEXA Take 1 tablet (10 mg total) by mouth every morning. What changed:  how much to take  when to take this   clotrimazole 1 % cream Commonly known as:  LOTRIMIN Apply 1 application topically 2 (two) times daily. Prescribed for rash in groin from [place] on 06/20/2014   fluticasone 50 MCG/ACT nasal spray Commonly known as:  FLONASE USE 2 SPRAYS IN EACH NOSTRIL EVERY DAY AS NEEDED  FOR  RHINITIS/ALLERGIES What changed:  how much to take  how to take this  when to take this  additional instructions   gabapentin 300 MG capsule Commonly known as:  NEURONTIN TAKE 1 TO 2 CAPSULES FOUR TIMES DAILY  AFTER MEALS AND AT BEDTIME. What changed:  See the new instructions.   hydrochlorothiazide 12.5 MG tablet Commonly known as:  HYDRODIURIL Take 1 tablet (12.5 mg total) by mouth daily. What changed:  medication strength  how much to take   hydroxypropyl methylcellulose / hypromellose 2.5 % ophthalmic solution Commonly known as:  ISOPTO TEARS / GONIOVISC Place 1 drop into both eyes 2 (two) times daily.   influenza vac recom quadrivalent 0.5 ML injection Commonly known as:  FLUBLOK Inject 0.5 mLs into the muscle once.   losartan 50 MG tablet Commonly known as:  COZAAR Take 50 mg by mouth daily as needed (for blood pressure). Reported on 09/16/2015   meclizine 25 MG tablet Commonly known as:  ANTIVERT TAKE 1 TABLET TWO TIMES DAILY AS NEEDED FOR DIZZINESS What changed:  See the new instructions.   nitroGLYCERIN 0.4 MG SL tablet Commonly known as:  NITROSTAT Place 1 tablet (0.4 mg total) under the tongue every 5 (five) minutes as needed. Chest pain   oxyCODONE-acetaminophen 10-325 MG tablet Commonly known as:  PERCOCET Take 1 tablet by mouth 2 (two) times daily as needed for  pain.   pantoprazole 40 MG tablet Commonly known as:  PROTONIX Take 40 mg by mouth 2 (two) times daily.   Potassium Chloride ER 20 MEQ Tbcr Take 20 mEq by mouth daily. What changed:  medication strength   primidone 250 MG tablet Commonly known as:  MYSOLINE Take 0.5 tablets (125 mg total) by mouth 3 (three) times daily. TAKE 1/2 TABLET THREE TIMES DAILY What changed:  additional instructions   simvastatin 40 MG tablet Commonly known as:  ZOCOR TAKE 1 AND 1/2 TABLETS AT BEDTIME What changed:  how much to take  how to take this  when to take this  additional instructions   tamsulosin 0.4 MG Caps capsule Commonly known as:  FLOMAX Take 1 capsule (0.4 mg total) by mouth daily. What changed:  how much to take  when to take this   timolol 0.5 % ophthalmic solution Commonly known as:  TIMOPTIC Place 1 drop into both eyes 2 (two) times daily.   Travoprost (BAK Free) 0.004 % Soln ophthalmic solution Commonly known as:  TRAVATAN Z Place 1 drop into both eyes at bedtime.   triamcinolone 0.025 % cream Commonly known as:  KENALOG Apply 1 application topically 2 (two) times daily.  Allergies  Allergen Reactions  . Morphine And Related Other (See Comments)    Migraine   . Hydrochlorothiazide Other (See Comments)    REACTION: hyponatremia with daily use  . Tape Itching    Patient  Is ok to use paper tape  . Lisinopril Cough  . Sulfa Antibiotics Rash    Procedures/Studies: Dg Chest 2 View  Result Date: 05/22/2016 CLINICAL DATA:  Shortness of breath starting this morning EXAM: CHEST  2 VIEW COMPARISON:  10/17/2015 FINDINGS: Cardiomediastinal silhouette is stable. No segmental infiltrate or pulmonary edema. Elevation of the right hemidiaphragm again noted. Left basilar atelectasis. Osteopenia and mild degenerative changes thoracic spine. IMPRESSION: No segmental infiltrate or pulmonary edema. Left basilar atelectasis. Electronically Signed   By: Lahoma Crocker M.D.    On: 05/22/2016 16:22   Ct Head Wo Contrast  Result Date: 05/22/2016 CLINICAL DATA:  Weakness for 3 days EXAM: CT HEAD WITHOUT CONTRAST TECHNIQUE: Contiguous axial images were obtained from the base of the skull through the vertex without intravenous contrast. COMPARISON:  07/01/2015 FINDINGS: Brain: No evidence of acute infarction, hemorrhage, hydrocephalus, extra-axial collection or mass lesion/mass effect. There is mild atrophy. Vascular: No hyperdense vessels. Mild carotid artery calcifications. Skull: Mastoid air cells clear. Small sclerotic focus at the right skullbase is unchanged. No fracture. Sinuses/Orbits: Complete opacification of left sphenoid sinus. No acute orbital abnormality. Other: None IMPRESSION: 1. No CT evidence for acute intracranial abnormality. 2. Complete opacification of left sphenoid sinus Electronically Signed   By: Donavan Foil M.D.   On: 05/22/2016 16:49   Mr Brain Wo Contrast  Result Date: 05/23/2016 CLINICAL DATA:  Difficulty ambulating because of extremity weakness. History of bladder cancer. History of hypertension. EXAM: MRI HEAD WITHOUT CONTRAST TECHNIQUE: Multiplanar, multiecho pulse sequences of the brain and surrounding structures were obtained without intravenous contrast. COMPARISON:  CT 05/22/2016.  MRI 07/01/2015 FINDINGS: Brain: Diffusion imaging does not show any acute or subacute infarction. The brainstem is normal. No cerebellar abnormality. Cerebral hemispheres show mild chronic small-vessel ischemic changes of the white matter. No cortical or large vessel territory infarction. No mass lesion, hemorrhage, hydrocephalus or extra-axial collection. No pituitary mass. Vascular: Major vessels at the base of the brain show flow. Skull and upper cervical spine: Negative Sinuses/Orbits: Opacified left division of the sphenoid sinus. Small amount of mastoid fluid on the left. Other: None significant IMPRESSION: No acute brain finding. Mild chronic small-vessel change  of the cerebral hemispheric white matter, less than often seen in healthy individuals of this age. Opacification of the left division of the sphenoid sinus. Electronically Signed   By: Nelson Chimes M.D.   On: 05/23/2016 17:13   Nm Pulmonary Perf And Vent  Result Date: 05/23/2016 CLINICAL DATA:  Intermittent dyspnea, chronic. EXAM: NUCLEAR MEDICINE VENTILATION - PERFUSION LUNG SCAN TECHNIQUE: Ventilation images were obtained in multiple projections using inhaled aerosol Tc-60m DTPA. Perfusion images were obtained in multiple projections after intravenous injection of Tc-13m MAA. RADIOPHARMACEUTICALS:  31.1 mCi Technetium-4m DTPA aerosol inhalation and 4.1 mCi Technetium-42m MAA IV COMPARISON:  PA and lateral chest 05/22/2016. FINDINGS: Ventilation: No focal defect is identified. Radiotracer distribution is mildly heterogeneous. Perfusion: Technically limited without obvious abnormality. IMPRESSION: Very low probability for pulmonary embolus. Electronically Signed   By: Inge Rise M.D.   On: 05/23/2016 13:12    (Echo, Carotid, EGD, Colonoscopy, ERCP)    Subjective: Pt feeling better and feels safe to go home with family support.  Discharge Exam: Vitals:   05/24/16 2255 05/25/16 UK:[id number]  BP: (!) 159/78 (!) 159/76  Pulse: (!) 54 60  Resp: 16   Temp: 99.9 F (37.7 C) 98.2 F (36.8 C)   Vitals:   05/24/16 0517 05/24/16 1320 05/24/16 2255 05/25/16 0647  BP: (!) 148/63 129/65 (!) 159/78 (!) 159/76  Pulse: (!) 58 62 (!) 54 60  Resp: 20 18 16    Temp: 98 F (36.7 C) 98.6 F (37 C) 99.9 F (37.7 C) 98.2 F (36.8 C)  TempSrc: Oral Oral Oral Oral  SpO2: 98% 98% 99% 94%  Weight:      Height:        General: Pt is alert, awake, not in acute distress Cardiovascular: RRR, S1/S2 +, no rubs, no gallops Respiratory: CTA bilaterally, no wheezing, no rhonchi Abdominal: Soft, NT, ND, bowel sounds + Extremities: no edema, no cyanosis   The results of significant diagnostics from this  hospitalization (including imaging, microbiology, ancillary and laboratory) are listed below for reference.     Microbiology: Recent Results (from the past 240 hour(s))  Culture, blood (Routine X 2) w Reflex to ID Panel     Status: None (Preliminary result)   Collection Time: 05/22/16  3:21 PM  Result Value Ref Range Status   Specimen Description BLOOD RIGHT HAND  Final   Special Requests BOTTLES DRAWN AEROBIC AND ANAEROBIC 8CC  Final   Culture   Final    NO GROWTH 2 DAYS Performed at [facility]    Report Status PENDING  Incomplete  Culture, blood (Routine X 2) w Reflex to ID Panel     Status: None (Preliminary result)   Collection Time: 05/22/16  3:27 PM  Result Value Ref Range Status   Specimen Description BLOOD LEFT HAND  Final   Special Requests BOTTLES DRAWN AEROBIC AND ANAEROBIC 5CC  Final   Culture   Final    NO GROWTH 2 DAYS Performed at [facility]    Report Status PENDING  Incomplete  Urine culture     Status: Abnormal   Collection Time: 05/22/16  7:34 PM  Result Value Ref Range Status   Specimen Description URINE, CATHETERIZED  Final   Special Requests NONE  Final   Culture >=100,000 COLONIES/mL PSEUDOMONAS AERUGINOSA (A)  Final   Report Status 05/25/2016 FINAL  Final   Organism ID, Bacteria PSEUDOMONAS AERUGINOSA (A)  Final      Susceptibility   Pseudomonas aeruginosa - MIC*    CEFTAZIDIME 4 SENSITIVE Sensitive     CIPROFLOXACIN <=0.25 SENSITIVE Sensitive     GENTAMICIN <=1 SENSITIVE Sensitive     IMIPENEM 2 SENSITIVE Sensitive     PIP/TAZO 8 SENSITIVE Sensitive     CEFEPIME 4 SENSITIVE Sensitive     * >=100,000 COLONIES/mL PSEUDOMONAS AERUGINOSA  Gastrointestinal Panel by PCR , Stool     Status: None   Collection Time: 05/23/16  6:30 AM  Result Value Ref Range Status   Campylobacter species NOT DETECTED NOT DETECTED Final   Plesimonas shigelloides NOT DETECTED NOT DETECTED Final   Salmonella species NOT DETECTED NOT DETECTED Final    Yersinia enterocolitica NOT DETECTED NOT DETECTED Final   Vibrio species NOT DETECTED NOT DETECTED Final   Vibrio cholerae NOT DETECTED NOT DETECTED Final   Enteroaggregative E coli (EAEC) NOT DETECTED NOT DETECTED Final   Enteropathogenic E coli (EPEC) NOT DETECTED NOT DETECTED Final   Enterotoxigenic E coli (ETEC) NOT DETECTED NOT DETECTED Final   Shiga like toxin producing E coli (STEC) NOT DETECTED NOT DETECTED Final  Shigella/Enteroinvasive E coli (EIEC) NOT DETECTED NOT DETECTED Final   Cryptosporidium NOT DETECTED NOT DETECTED Final   Cyclospora cayetanensis NOT DETECTED NOT DETECTED Final   Entamoeba histolytica NOT DETECTED NOT DETECTED Final   Giardia lamblia NOT DETECTED NOT DETECTED Final   Adenovirus F40/41 NOT DETECTED NOT DETECTED Final   Astrovirus NOT DETECTED NOT DETECTED Final   Norovirus GI/GII NOT DETECTED NOT DETECTED Final   Rotavirus A NOT DETECTED NOT DETECTED Final   Sapovirus (I, II, IV, and V) NOT DETECTED NOT DETECTED Final     Labs: BNP (last 3 results)  Recent Labs  09/17/15 0445  BNP [id number]   Basic Metabolic Panel:  Recent Labs Lab 05/22/16 1444 05/22/16 2337 05/23/16 0503 05/23/16 0506 05/24/16 1025  NA 130*  --   --  132* 133*  K 2.7*  --   --  3.9 3.2*  CL 97*  --   --  103 103  CO2 23  --   --  23 24  GLUCOSE 180*  --   --  127* 114*  BUN 19  --   --  16 12  CREATININE 1.24 1.00  --  0.93 0.78  CALCIUM 8.7*  --   --  8.0* 8.3*  MG  --   --  1.6*  --   --    Liver Function Tests:  Recent Labs Lab 05/23/16 0506  AST 43*  ALT 20  ALKPHOS 44  BILITOT 1.7*  PROT 5.7*  ALBUMIN 3.3*   No results for input(s): LIPASE, AMYLASE in the last 168 hours. No results for input(s): AMMONIA in the last 168 hours. CBC:  Recent Labs Lab 05/22/16 1444 05/22/16 2337 05/23/16 0506 05/24/16 1025  WBC 21.2* 20.6* 18.9* 13.7*  NEUTROABS  --   --   --  11.8*  HGB 14.0 13.6 12.6* 12.2*  HCT 40.1 38.9* 36.4* 36.1*  MCV 88.7 90.0 89.4 90.7   PLT 177 141* 158 158   Cardiac Enzymes:  Recent Labs Lab 05/22/16 2337 05/23/16 0506 05/23/16 1109  CKTOTAL  --   --  149  TROPONINI <0.03 0.03* <0.03   BNP: Invalid input(s): POCBNP CBG:  Recent Labs Lab 05/22/16 1524  GLUCAP 150*   D-Dimer  Recent Labs  05/22/16 2337  DDIMER 0.32   Hgb A1c No results for input(s): HGBA1C in the last 72 hours. Lipid Profile No results for input(s): CHOL, HDL, LDLCALC, TRIG, CHOLHDL, LDLDIRECT in the last 72 hours. Thyroid function studies No results for input(s): TSH, T4TOTAL, T3FREE, THYROIDAB in the last 72 hours.  Invalid input(s): FREET3 Anemia work up No results for input(s): VITAMINB12, FOLATE, FERRITIN, TIBC, IRON, RETICCTPCT in the last 72 hours. Urinalysis    Component Value Date/Time   COLORURINE AMBER (A) 05/22/2016 1934   APPEARANCEUR CLOUDY (A) 05/22/2016 1934   LABSPEC 1.034 (H) 05/22/2016 1934   PHURINE 6.0 05/22/2016 1934   GLUCOSEU NEGATIVE 05/22/2016 1934   GLUCOSEU NEGATIVE 10/28/2012 1542   HGBUR TRACE (A) 05/22/2016 1934   BILIRUBINUR NEGATIVE 05/22/2016 1934   KETONESUR NEGATIVE 05/22/2016 1934   PROTEINUR NEGATIVE 05/22/2016 1934   UROBILINOGEN 0.2 10/28/2012 1542   NITRITE NEGATIVE 05/22/2016 1934   LEUKOCYTESUR SMALL (A) 05/22/2016 1934   Sepsis Labs Invalid input(s): PROCALCITONIN,  WBC,  LACTICIDVEN Microbiology Recent Results (from the past 240 hour(s))  Culture, blood (Routine X 2) w Reflex to ID Panel     Status: None (Preliminary result)   Collection Time: 05/22/16  3:21 PM  Result Value Ref Range Status   Specimen Description BLOOD RIGHT HAND  Final   Special Requests BOTTLES DRAWN AEROBIC AND ANAEROBIC 8CC  Final   Culture   Final    NO GROWTH 2 DAYS Performed at [facility]    Report Status PENDING  Incomplete  Culture, blood (Routine X 2) w Reflex to ID Panel     Status: None (Preliminary result)   Collection Time: 05/22/16  3:27 PM  Result Value Ref Range Status    Specimen Description BLOOD LEFT HAND  Final   Special Requests BOTTLES DRAWN AEROBIC AND ANAEROBIC 5CC  Final   Culture   Final    NO GROWTH 2 DAYS Performed at [facility]    Report Status PENDING  Incomplete  Urine culture     Status: Abnormal   Collection Time: 05/22/16  7:34 PM  Result Value Ref Range Status   Specimen Description URINE, CATHETERIZED  Final   Special Requests NONE  Final   Culture >=100,000 COLONIES/mL PSEUDOMONAS AERUGINOSA (A)  Final   Report Status 05/25/2016 FINAL  Final   Organism ID, Bacteria PSEUDOMONAS AERUGINOSA (A)  Final      Susceptibility   Pseudomonas aeruginosa - MIC*    CEFTAZIDIME 4 SENSITIVE Sensitive     CIPROFLOXACIN <=0.25 SENSITIVE Sensitive     GENTAMICIN <=1 SENSITIVE Sensitive     IMIPENEM 2 SENSITIVE Sensitive     PIP/TAZO 8 SENSITIVE Sensitive     CEFEPIME 4 SENSITIVE Sensitive     * >=100,000 COLONIES/mL PSEUDOMONAS AERUGINOSA  Gastrointestinal Panel by PCR , Stool     Status: None   Collection Time: 05/23/16  6:30 AM  Result Value Ref Range Status   Campylobacter species NOT DETECTED NOT DETECTED Final   Plesimonas shigelloides NOT DETECTED NOT DETECTED Final   Salmonella species NOT DETECTED NOT DETECTED Final   Yersinia enterocolitica NOT DETECTED NOT DETECTED Final   Vibrio species NOT DETECTED NOT DETECTED Final   Vibrio cholerae NOT DETECTED NOT DETECTED Final   Enteroaggregative E coli (EAEC) NOT DETECTED NOT DETECTED Final   Enteropathogenic E coli (EPEC) NOT DETECTED NOT DETECTED Final   Enterotoxigenic E coli (ETEC) NOT DETECTED NOT DETECTED Final   Shiga like toxin producing E coli (STEC) NOT DETECTED NOT DETECTED Final   Shigella/Enteroinvasive E coli (EIEC) NOT DETECTED NOT DETECTED Final   Cryptosporidium NOT DETECTED NOT DETECTED Final   Cyclospora cayetanensis NOT DETECTED NOT DETECTED Final   Entamoeba histolytica NOT DETECTED NOT DETECTED Final   Giardia lamblia NOT DETECTED NOT DETECTED Final    Adenovirus F40/41 NOT DETECTED NOT DETECTED Final   Astrovirus NOT DETECTED NOT DETECTED Final   Norovirus GI/GII NOT DETECTED NOT DETECTED Final   Rotavirus A NOT DETECTED NOT DETECTED Final   Sapovirus (I, II, IV, and V) NOT DETECTED NOT DETECTED Final   Time coordinating discharge: 31 minutes  SIGNED:  Irwin Brakeman, MD  [facility] 05/25/2016, 2:09 PM Pager   If 7PM-7AM, please contact night-coverage [email or web address] Password [email or web address]

## 2016-05-26 ENCOUNTER — Telehealth: Payer: Self-pay | Admitting: Internal Medicine

## 2016-05-26 ENCOUNTER — Telehealth: Payer: Self-pay | Admitting: *Deleted

## 2016-05-26 DIAGNOSIS — B965 Pseudomonas (aeruginosa) (mallei) (pseudomallei) as the cause of diseases classified elsewhere: Secondary | ICD-10-CM | POA: Diagnosis not present

## 2016-05-26 DIAGNOSIS — C679 Malignant neoplasm of bladder, unspecified: Secondary | ICD-10-CM | POA: Diagnosis not present

## 2016-05-26 DIAGNOSIS — G250 Essential tremor: Secondary | ICD-10-CM | POA: Diagnosis not present

## 2016-05-26 DIAGNOSIS — N390 Urinary tract infection, site not specified: Secondary | ICD-10-CM | POA: Diagnosis not present

## 2016-05-26 DIAGNOSIS — I10 Essential (primary) hypertension: Secondary | ICD-10-CM | POA: Diagnosis not present

## 2016-05-26 DIAGNOSIS — M6281 Muscle weakness (generalized): Secondary | ICD-10-CM | POA: Diagnosis not present

## 2016-05-26 NOTE — Telephone Encounter (Signed)
PT from [facility] called request verbal order for PT 2x a week for 7 weeks. He also wants to report Mr. Schwein BP today 180/95 and report that pt only take his BP med when its high. Please advise.   # [phone]

## 2016-05-26 NOTE — Telephone Encounter (Signed)
Transition Care Management Follow-up Telephone Call   Date discharged? 05/25/16   How have you been since you were released from the hospital? Pt states he is doing alright   Do you understand why you were in the hospital? YES   Do you understand the discharge instructions? YES   Where were you discharged to? Home   Items Reviewed:  Medications reviewed: YES  Allergies reviewed: YES  Dietary changes reviewed: NO  Referrals reviewed: NO Referral needed   Functional Questionnaire:   Activities of Daily Living (ADLs):   He states he are independent in the following: ambulation, bathing and hygiene, feeding, continence, grooming, toileting and dressing States he require assistance with the following: ambulation   Any transportation issues/concerns?: NO   Any patient concerns? NO   Confirmed importance and date/time of follow-up visits scheduled YES, APPT 06/04/16  Provider Appointment booked with DR Jenny Reichmann  Confirmed with patient if condition begins to worsen call PCP or go to the ER.  Patient was given the office number and encouraged to call back with question or concerns.  : YES

## 2016-05-26 NOTE — Telephone Encounter (Signed)
Ok for verbal ,thanks 

## 2016-05-27 LAB — CULTURE, BLOOD (ROUTINE X 2)
CULTURE: NO GROWTH
Culture: NO GROWTH

## 2016-05-27 NOTE — Telephone Encounter (Signed)
Verbals given  

## 2016-05-28 DIAGNOSIS — I10 Essential (primary) hypertension: Secondary | ICD-10-CM | POA: Diagnosis not present

## 2016-05-28 DIAGNOSIS — G250 Essential tremor: Secondary | ICD-10-CM | POA: Diagnosis not present

## 2016-05-28 DIAGNOSIS — C679 Malignant neoplasm of bladder, unspecified: Secondary | ICD-10-CM | POA: Diagnosis not present

## 2016-05-28 DIAGNOSIS — N390 Urinary tract infection, site not specified: Secondary | ICD-10-CM | POA: Diagnosis not present

## 2016-05-28 DIAGNOSIS — B965 Pseudomonas (aeruginosa) (mallei) (pseudomallei) as the cause of diseases classified elsewhere: Secondary | ICD-10-CM | POA: Diagnosis not present

## 2016-05-28 DIAGNOSIS — M6281 Muscle weakness (generalized): Secondary | ICD-10-CM | POA: Diagnosis not present

## 2016-06-02 DIAGNOSIS — C679 Malignant neoplasm of bladder, unspecified: Secondary | ICD-10-CM | POA: Diagnosis not present

## 2016-06-02 DIAGNOSIS — B965 Pseudomonas (aeruginosa) (mallei) (pseudomallei) as the cause of diseases classified elsewhere: Secondary | ICD-10-CM | POA: Diagnosis not present

## 2016-06-02 DIAGNOSIS — I10 Essential (primary) hypertension: Secondary | ICD-10-CM | POA: Diagnosis not present

## 2016-06-02 DIAGNOSIS — G250 Essential tremor: Secondary | ICD-10-CM | POA: Diagnosis not present

## 2016-06-02 DIAGNOSIS — N390 Urinary tract infection, site not specified: Secondary | ICD-10-CM | POA: Diagnosis not present

## 2016-06-02 DIAGNOSIS — M6281 Muscle weakness (generalized): Secondary | ICD-10-CM | POA: Diagnosis not present

## 2016-06-04 ENCOUNTER — Encounter: Payer: Self-pay | Admitting: Internal Medicine

## 2016-06-04 ENCOUNTER — Ambulatory Visit (INDEPENDENT_AMBULATORY_CARE_PROVIDER_SITE_OTHER): Payer: Medicare Other | Admitting: Internal Medicine

## 2016-06-04 ENCOUNTER — Other Ambulatory Visit (INDEPENDENT_AMBULATORY_CARE_PROVIDER_SITE_OTHER): Payer: Medicare Other

## 2016-06-04 VITALS — BP 128/78 | HR 65 | Temp 97.9°F | Resp 20 | Wt 190.0 lb

## 2016-06-04 DIAGNOSIS — M6281 Muscle weakness (generalized): Secondary | ICD-10-CM | POA: Diagnosis not present

## 2016-06-04 DIAGNOSIS — R531 Weakness: Secondary | ICD-10-CM | POA: Diagnosis not present

## 2016-06-04 DIAGNOSIS — G250 Essential tremor: Secondary | ICD-10-CM | POA: Diagnosis not present

## 2016-06-04 DIAGNOSIS — N3000 Acute cystitis without hematuria: Secondary | ICD-10-CM

## 2016-06-04 DIAGNOSIS — M48061 Spinal stenosis, lumbar region without neurogenic claudication: Secondary | ICD-10-CM | POA: Diagnosis not present

## 2016-06-04 DIAGNOSIS — I10 Essential (primary) hypertension: Secondary | ICD-10-CM

## 2016-06-04 DIAGNOSIS — B965 Pseudomonas (aeruginosa) (mallei) (pseudomallei) as the cause of diseases classified elsewhere: Secondary | ICD-10-CM | POA: Diagnosis not present

## 2016-06-04 DIAGNOSIS — N390 Urinary tract infection, site not specified: Secondary | ICD-10-CM | POA: Diagnosis not present

## 2016-06-04 DIAGNOSIS — Z23 Encounter for immunization: Secondary | ICD-10-CM | POA: Diagnosis not present

## 2016-06-04 DIAGNOSIS — R0600 Dyspnea, unspecified: Secondary | ICD-10-CM

## 2016-06-04 DIAGNOSIS — C679 Malignant neoplasm of bladder, unspecified: Secondary | ICD-10-CM | POA: Diagnosis not present

## 2016-06-04 LAB — CBC WITH DIFFERENTIAL/PLATELET
Basophils Absolute: 0.1 10*3/uL (ref 0.0–0.1)
Basophils Relative: 0.7 % (ref 0.0–3.0)
EOS PCT: 1.4 % (ref 0.0–5.0)
Eosinophils Absolute: 0.1 10*3/uL (ref 0.0–0.7)
HCT: 43.8 % (ref 39.0–52.0)
Hemoglobin: 15.1 g/dL (ref 13.0–17.0)
LYMPHS ABS: 1.3 10*3/uL (ref 0.7–4.0)
Lymphocytes Relative: 16.6 % (ref 12.0–46.0)
MCHC: 34.3 g/dL (ref 30.0–36.0)
MCV: 89.3 fl (ref 78.0–100.0)
MONOS PCT: 8.4 % (ref 3.0–12.0)
Monocytes Absolute: 0.7 10*3/uL (ref 0.1–1.0)
NEUTROS ABS: 5.7 10*3/uL (ref 1.4–7.7)
NEUTROS PCT: 72.9 % (ref 43.0–77.0)
PLATELETS: 403 10*3/uL — AB (ref 150.0–400.0)
RBC: 4.91 Mil/uL (ref 4.22–5.81)
RDW: 13.4 % (ref 11.5–15.5)
WBC: 7.9 10*3/uL (ref 4.0–10.5)

## 2016-06-04 LAB — URINALYSIS, ROUTINE W REFLEX MICROSCOPIC
Bilirubin Urine: NEGATIVE
Hgb urine dipstick: NEGATIVE
KETONES UR: NEGATIVE
Leukocytes, UA: NEGATIVE
Nitrite: NEGATIVE
PH: 6 (ref 5.0–8.0)
RBC / HPF: NONE SEEN (ref 0–?)
SPECIFIC GRAVITY, URINE: 1.01 (ref 1.000–1.030)
Total Protein, Urine: NEGATIVE
UROBILINOGEN UA: 0.2 (ref 0.0–1.0)
Urine Glucose: NEGATIVE
WBC, UA: NONE SEEN (ref 0–?)

## 2016-06-04 LAB — BASIC METABOLIC PANEL
BUN: 11 mg/dL (ref 6–23)
CALCIUM: 9.6 mg/dL (ref 8.4–10.5)
CO2: 29 meq/L (ref 19–32)
CREATININE: 0.84 mg/dL (ref 0.40–1.50)
Chloride: 93 mEq/L — ABNORMAL LOW (ref 96–112)
GFR: 93.71 mL/min (ref >60.00)
Glucose, Bld: 97 mg/dL (ref 70–99)
Potassium: 4.7 mEq/L (ref 3.5–5.1)
Sodium: 129 mEq/L — ABNORMAL LOW (ref 135–145)

## 2016-06-04 MED ORDER — ALBUTEROL SULFATE HFA 108 (90 BASE) MCG/ACT IN AERS: 2.0000 | INHALATION_SPRAY | Freq: Four times a day (QID) | RESPIRATORY_TRACT | 11 refills | Status: DC | PRN

## 2016-06-04 NOTE — Patient Instructions (Signed)
Please take all new medication as prescribed - the albuterol inhaler to see if helps  .Please continue all other medications as before, and refills have been done if requested.  Please have the pharmacy call with any other refills you may need.  Please keep your appointments with your specialists as you may have planned  You will be contacted regarding the referral for: Lung testing (pulmonary function tests)  Please go to the LAB in the Basement (turn left off the elevator) for the tests to be done today  You will be contacted by phone if any changes need to be made immediately.  Otherwise, you will receive a letter about your results with an explanation, but please check with MyChart first.  Please remember to sign up for MyChart if you have not done so, as this will be important to you in the future with finding out test results, communicating by private email, and scheduling acute appointments online when needed.  Please return in 3 months, or sooner if needed

## 2016-06-04 NOTE — Progress Notes (Signed)
Subjective:    Patient ID: [name], male    DOB: 1935-11-03, 80 y.o.   MRN: [id number]  HPI  Here to f/u with family 10 days s/p recent hospn for episode of rapidly progressing general weakness, mild sob, mild diarrhea tx for pseudomonal UTI with cipro now finished.  Denies urinary symptoms such as dysuria, frequency, urgency, flank pain, hematuria or n/v, fever, chills.  Pt continues to have recurring LBP severe at times, without bowel or bladder change, fever, wt loss,  worsening LE pain/numbness/weakness, gait change or falls, but for spinal stimulator next wk.  Also has urology f/u next wk.  Also still with intermittent sob on exertion, though Pt denies chest pain, wheezing, orthopnea, PND, increased LE swelling, palpitations, dizziness or syncope. No other new history findings Past Medical History:  Diagnosis Date  . Acute bronchitis per pt no fever--  cough since discharge from hospital 07-02-2015   per pcp note 07-05-2015  mild to moderate bronchitis vs pna  . Allergic rhinitis   . Bladder cancer ([place])   . BPH (benign prostatic hyperplasia)   . Bradycardia   . CAD (coronary artery disease)   . CHF (congestive heart failure) ([place])   . Chronic low back pain   . DDD (degenerative disc disease), lumbosacral   . Depression   . Diverticulosis of colon (without mention of hemorrhage)   . Essential tremor   . Fatty liver disease, nonalcoholic   . GERD (gastroesophageal reflux disease)   . Glaucoma   . History of bladder cancer    2004--  TCC low-grade , non-invasive  . History of colon polyps   . History of exercise stress test    02-05-2007-- ETT (Clinically positive, electrically equivocal, submaximal ETT,  appropriate bp response to exercise  . History of hiatal hernia   . History of kidney stones   . History of peptic ulcer   . HLD (hyperlipidemia)   . HTN (hypertension)   . Nocturia   . Productive cough   . Rheumatoid arthritis(714.0)   . Right renal artery stenosis  (HCC)    per cath 03-14-2009 --- 40-50%  . RLS (restless legs syndrome)   . Vertigo, intermittent   . Weak urinary stream   . Wears dentures    upper  . Wears glasses   . Wears hearing aid    bilateral  . Wears partial dentures    lower   Past Surgical History:  Procedure Laterality Date  . CARDIAC CATHETERIZATION  02-12-2007  dr gregg taylor   Non-obstructive CAD,  20% pLCX,  20% LAD, ef 65%  . CARDIAC CATHETERIZATION  03-14-2009  dr Johnsie Cancel   pLAD and mid diaginal 20-30% multiple lesions,  OM1 20%, right renal ostial 40-50%,  ef 60%  . CATARACT EXTRACTION W/ INTRAOCULAR LENS  IMPLANT, BILATERAL  2016  . CYSTOSCOPY WITH BIOPSY N/A 07/11/2015   Procedure: CYSTOSCOPY WITH COLD CUP RESECTION AND FULGERATION;  Surgeon: Rana Snare, MD;  Location: [facility];  Service: Urology;  Laterality: N/A;  . HIP ARTHROSCOPY W/ LABRAL DEBRIDEMENT Left 06-29-2000   and chondraplasty  . LUMBAR [place] SURGERY  07-16-2005  . LUMBAR LAMINECTOMY/DECOMPRESSION MICRODISCECTOMY  07/09/2011   Procedure: LUMBAR LAMINECTOMY/DECOMPRESSION MICRODISCECTOMY;  Surgeon: Johnn Hai;  Location: [facility];  Service: Orthopedics;  Laterality: Left;  Decompression L5 - S1 on the Left and repair of dura  . LUMBAR LAMINECTOMY/DECOMPRESSION MICRODISCECTOMY N/A 07/27/2013   Procedure: DECOMPRESSION L4 - L5 WITH EXCISION OF SYNOVIAL CYST  AND, LATERAL MASS FUSION 1 LEVEL;  Surgeon: Johnn Hai, MD;  Location: [facility];  Service: Orthopedics;  Laterality: N/A;  . LUMBAR LAMINECTOMY/DECOMPRESSION MICRODISCECTOMY Left 07/06/2014   Procedure:  MICRO LUMBAR DECOMPRESSION L5-S1 ON LEFT, L4-5 REDO;  Surgeon: Johnn Hai, MD;  Location: [facility];  Service: Orthopedics;  Laterality: Left;  . TONSILLECTOMY AND ADENOIDECTOMY    . TRANSTHORACIC ECHOCARDIOGRAM  09-20-2010   normal LVF, ef 55-65%/  mild MR, TR and PR/  mild LAE  . TRANSURETHRAL RESECTION OF BLADDER TUMOR  09-21-2002    reports that he quit smoking  about 33 years ago. His smoking use included Cigarettes. He has a 40.00 pack-year smoking history. He has never used smokeless tobacco. He reports that he does not drink alcohol or use drugs. family history includes Cancer in his brother, father, and sister; Diabetes in his sister; Heart attack in his mother. Allergies  Allergen Reactions  . Morphine And Related Other (See Comments)    Migraine   . Hydrochlorothiazide Other (See Comments)    REACTION: hyponatremia with daily use  . Tape Itching    Patient  Is ok to use paper tape  . Lisinopril Cough  . Sulfa Antibiotics Rash   Current Outpatient Prescriptions on File Prior to Visit  Medication Sig Dispense Refill  . amLODipine (NORVASC) 5 MG tablet Take 5 mg by mouth daily as needed (for blood pressure).     . brimonidine (ALPHAGAN) 0.15 % ophthalmic solution Place 1 drop into both eyes 2 (two) times daily.    . citalopram (CELEXA) 10 MG tablet Take 1 tablet (10 mg total) by mouth every morning. (Patient taking differently: Take 20 mg by mouth at bedtime. ) 90 tablet 3  . clotrimazole (LOTRIMIN) 1 % cream Apply 1 application topically 2 (two) times daily. Prescribed for rash in groin from [place] on 06/20/2014     . fluticasone (FLONASE) 50 MCG/ACT nasal spray USE 2 SPRAYS IN EACH NOSTRIL EVERY DAY AS NEEDED  FOR  RHINITIS/ALLERGIES (Patient taking differently: Place 2 sprays into both nostrils every evening. ) 48 g 3  . gabapentin (NEURONTIN) 300 MG capsule TAKE 1 TO 2 CAPSULES FOUR TIMES DAILY  AFTER MEALS AND AT BEDTIME. (Patient taking differently: TAKE 2 CAPSULES FOUR TIMES DAILY  AFTER MEALS AND AT BEDTIME.) 720 capsule 3  . hydrochlorothiazide (HYDRODIURIL) 12.5 MG tablet Take 1 tablet (12.5 mg total) by mouth daily. 30 tablet 0  . hydroxypropyl methylcellulose / hypromellose (ISOPTO TEARS / GONIOVISC) 2.5 % ophthalmic solution Place 1 drop into both eyes 2 (two) times daily.    . influenza vac recom quadrivalent (FLUBLOK) 0.5 ML injection  Inject 0.5 mLs into the muscle once.    Marland Kitchen losartan (COZAAR) 50 MG tablet Take 50 mg by mouth daily as needed (for blood pressure). Reported on 09/16/2015    . meclizine (ANTIVERT) 25 MG tablet TAKE 1 TABLET TWO TIMES DAILY AS NEEDED FOR DIZZINESS (Patient taking differently: Takes 25mg  by mouth at night) 90 tablet 0  . nitroGLYCERIN (NITROSTAT) 0.4 MG SL tablet Place 1 tablet (0.4 mg total) under the tongue every 5 (five) minutes as needed. Chest pain 60 tablet 5  . oxyCODONE-acetaminophen (PERCOCET) 10-325 MG tablet Take 1 tablet by mouth 2 (two) times daily as needed for pain.    . pantoprazole (PROTONIX) 40 MG tablet Take 40 mg by mouth 2 (two) times daily.     . potassium chloride 20 MEQ TBCR Take 20 mEq by mouth daily. [place]  tablet 0  . primidone (MYSOLINE) 250 MG tablet Take 0.5 tablets (125 mg total) by mouth 3 (three) times daily. TAKE 1/2 TABLET THREE TIMES DAILY (Patient taking differently: Take 125 mg by mouth 3 (three) times daily. ) 135 tablet 3  . simvastatin (ZOCOR) 40 MG tablet TAKE 1 AND 1/2 TABLETS AT BEDTIME (Patient taking differently: Take 60 mg by mouth at bedtime. ) 135 tablet 3  . tamsulosin (FLOMAX) 0.4 MG CAPS capsule Take 1 capsule (0.4 mg total) by mouth daily. (Patient taking differently: Take 0.8 mg by mouth at bedtime. ) 90 capsule 3  . timolol (TIMOPTIC) 0.5 % ophthalmic solution Place 1 drop into both eyes 2 (two) times daily.   1  . Travoprost, BAK Free, (TRAVATAN Z) 0.004 % SOLN ophthalmic solution Place 1 drop into both eyes at bedtime. 5 mL 5  . triamcinolone (KENALOG) 0.025 % cream Apply 1 application topically 2 (two) times daily.     No current facility-administered medications on file prior to visit.    Review of Systems  Constitutional: Negative for unusual diaphoresis or night sweats HENT: Negative for ear swelling or discharge Eyes: Negative for worsening visual haziness  Respiratory: Negative for choking and stridor.   Gastrointestinal: Negative for  distension or worsening eructation Genitourinary: Negative for retention or change in urine volume.  Musculoskeletal: Negative for other MSK pain or swelling Skin: Negative for color change and worsening wound Neurological: Negative for tremors and numbness other than noted  Psychiatric/Behavioral: Negative for decreased concentration or agitation other than above   All other system neg per pt    Objective:   Physical Exam BP 128/78   Pulse 65   Temp 97.9 F (36.6 C) (Oral)   Resp 20   Wt 190 lb (86.2 kg)   SpO2 96%   BMI 29.76 kg/m  VS noted,  Constitutional: Pt appears in no apparent distress HENT: Head: NCAT.  Right Ear: External ear normal.  Left Ear: External ear normal.  Eyes: . Pupils are equal, round, and reactive to light. Conjunctivae and EOM are normal Neck: Normal range of motion. Neck supple.  Cardiovascular: Normal rate and regular rhythm.   Pulmonary/Chest: Effort normal and breath sounds without rales or wheezing.  Abd:  Soft, NT, ND, + BS, no flank tender No new RA joint effusions or tenderness; spine with midline low lumbar tender without swelling or rash Neurological: Pt is alert. Not confused , motor grossly intact Skin: Skin is warm. No rash, trace  LE edema Psychiatric: Pt behavior is normal. No agitation.  No other new exam findings.    Assessment & Plan:

## 2016-06-04 NOTE — Progress Notes (Signed)
Pre visit review using our clinic review tool, if applicable. No additional management support is needed unless otherwise documented below in the visit note. 

## 2016-06-05 NOTE — Assessment & Plan Note (Signed)
Cont same tx, has f/u initial temporary spinal stimulator next wk,  to f/u any worsening symptoms or concerns

## 2016-06-05 NOTE — Assessment & Plan Note (Signed)
Recent cxr neg for acute including any mention of interstitial dz or RA changes, for PFT's and trial albuterol MDI prn

## 2016-06-05 NOTE — Assessment & Plan Note (Addendum)
Relatively asympt to start, pt requests f/u urine studies,  to f/u any worsening symptoms or concerns, also for f/u urology next wk

## 2016-06-05 NOTE — Assessment & Plan Note (Signed)
Walks with cane, overall improved,  to f/u any worsening symptoms or concerns

## 2016-06-05 NOTE — Assessment & Plan Note (Signed)
stable overall by history and exam, recent data reviewed with pt, and pt to continue medical treatment as before,  to f/u any worsening symptoms or concerns BP Readings from Last 3 Encounters:  06/04/16 128/78  05/25/16 (!) 140/57  12/04/15 124/79

## 2016-06-06 LAB — URINE CULTURE: Organism ID, Bacteria: NO GROWTH

## 2016-06-09 DIAGNOSIS — M6281 Muscle weakness (generalized): Secondary | ICD-10-CM | POA: Diagnosis not present

## 2016-06-09 DIAGNOSIS — G250 Essential tremor: Secondary | ICD-10-CM | POA: Diagnosis not present

## 2016-06-09 DIAGNOSIS — C679 Malignant neoplasm of bladder, unspecified: Secondary | ICD-10-CM | POA: Diagnosis not present

## 2016-06-09 DIAGNOSIS — I10 Essential (primary) hypertension: Secondary | ICD-10-CM | POA: Diagnosis not present

## 2016-06-09 DIAGNOSIS — B965 Pseudomonas (aeruginosa) (mallei) (pseudomallei) as the cause of diseases classified elsewhere: Secondary | ICD-10-CM | POA: Diagnosis not present

## 2016-06-09 DIAGNOSIS — N390 Urinary tract infection, site not specified: Secondary | ICD-10-CM | POA: Diagnosis not present

## 2016-06-11 DIAGNOSIS — N390 Urinary tract infection, site not specified: Secondary | ICD-10-CM | POA: Diagnosis not present

## 2016-06-11 DIAGNOSIS — M6281 Muscle weakness (generalized): Secondary | ICD-10-CM | POA: Diagnosis not present

## 2016-06-11 DIAGNOSIS — B965 Pseudomonas (aeruginosa) (mallei) (pseudomallei) as the cause of diseases classified elsewhere: Secondary | ICD-10-CM | POA: Diagnosis not present

## 2016-06-11 DIAGNOSIS — G250 Essential tremor: Secondary | ICD-10-CM | POA: Diagnosis not present

## 2016-06-11 DIAGNOSIS — I10 Essential (primary) hypertension: Secondary | ICD-10-CM | POA: Diagnosis not present

## 2016-06-11 DIAGNOSIS — C679 Malignant neoplasm of bladder, unspecified: Secondary | ICD-10-CM | POA: Diagnosis not present

## 2016-06-11 DIAGNOSIS — N3000 Acute cystitis without hematuria: Secondary | ICD-10-CM | POA: Diagnosis not present

## 2016-06-12 DIAGNOSIS — M961 Postlaminectomy syndrome, not elsewhere classified: Secondary | ICD-10-CM | POA: Diagnosis not present

## 2016-06-12 DIAGNOSIS — I10 Essential (primary) hypertension: Secondary | ICD-10-CM | POA: Diagnosis not present

## 2016-06-12 DIAGNOSIS — G250 Essential tremor: Secondary | ICD-10-CM

## 2016-06-12 DIAGNOSIS — C679 Malignant neoplasm of bladder, unspecified: Secondary | ICD-10-CM

## 2016-06-12 DIAGNOSIS — B965 Pseudomonas (aeruginosa) (mallei) (pseudomallei) as the cause of diseases classified elsewhere: Secondary | ICD-10-CM | POA: Diagnosis not present

## 2016-06-12 DIAGNOSIS — F4322 Adjustment disorder with anxiety: Secondary | ICD-10-CM | POA: Diagnosis not present

## 2016-06-12 DIAGNOSIS — M6281 Muscle weakness (generalized): Secondary | ICD-10-CM | POA: Diagnosis not present

## 2016-06-12 DIAGNOSIS — N390 Urinary tract infection, site not specified: Secondary | ICD-10-CM | POA: Diagnosis not present

## 2016-06-12 DIAGNOSIS — F4542 Pain disorder with related psychological factors: Secondary | ICD-10-CM | POA: Diagnosis not present

## 2016-06-12 DIAGNOSIS — M48061 Spinal stenosis, lumbar region without neurogenic claudication: Secondary | ICD-10-CM | POA: Diagnosis not present

## 2016-06-17 ENCOUNTER — Encounter: Payer: Self-pay | Admitting: Internal Medicine

## 2016-06-17 DIAGNOSIS — N390 Urinary tract infection, site not specified: Secondary | ICD-10-CM | POA: Diagnosis not present

## 2016-06-17 DIAGNOSIS — C679 Malignant neoplasm of bladder, unspecified: Secondary | ICD-10-CM | POA: Diagnosis not present

## 2016-06-17 DIAGNOSIS — G250 Essential tremor: Secondary | ICD-10-CM | POA: Diagnosis not present

## 2016-06-17 DIAGNOSIS — B965 Pseudomonas (aeruginosa) (mallei) (pseudomallei) as the cause of diseases classified elsewhere: Secondary | ICD-10-CM | POA: Diagnosis not present

## 2016-06-17 DIAGNOSIS — I10 Essential (primary) hypertension: Secondary | ICD-10-CM | POA: Diagnosis not present

## 2016-06-17 DIAGNOSIS — M6281 Muscle weakness (generalized): Secondary | ICD-10-CM | POA: Diagnosis not present

## 2016-06-19 ENCOUNTER — Other Ambulatory Visit: Payer: Self-pay

## 2016-06-20 ENCOUNTER — Other Ambulatory Visit: Payer: Self-pay | Admitting: Internal Medicine

## 2016-06-26 ENCOUNTER — Ambulatory Visit (INDEPENDENT_AMBULATORY_CARE_PROVIDER_SITE_OTHER): Payer: Medicare Other | Admitting: Podiatry

## 2016-06-26 DIAGNOSIS — M79676 Pain in unspecified toe(s): Secondary | ICD-10-CM

## 2016-06-26 DIAGNOSIS — B351 Tinea unguium: Secondary | ICD-10-CM

## 2016-06-27 NOTE — Progress Notes (Signed)
Subjective:     Patient ID: [name], male   DOB: 1936/07/01, 80 y.o.   MRN: [id number]  HPI patient presents with thick yellow brittle nailbeds 1-5 both feet that he cannot cut and are painful   Review of Systems     Objective:   Physical Exam Neurovascular status intact with thick yellow brittle nailbeds 1-5 both feet that are painful    Assessment:     Mycotic nail infections with pain    Plan:     Debris painful nailbeds 1-5 both feet with no iatrogenic bleeding noted

## 2016-07-09 NOTE — Progress Notes (Deleted)
Subjective:   [name] is a 80 y.o. male who presents for an Initial Medicare Annual Wellness Visit.  The Patient was informed that the wellness visit is to identify future health risk and educate and initiate measures that can reduce risk for increased disease through the lifespan.   Describes health as fair, good or great?   Review of Systems  No ROS.  Medicare Wellness Visit.    Sleep patterns:  Home Safety/Smoke Alarms:   Living environment; residence and Firearm Safety:  [place] Safety/Bike Helmet:   Counseling:   Eye Exam-  Dental-   Male:   CCS-colonoscopy 07/11/2009, normal.  PSA-10/28/2012, 0.290    Objective:    There were no vitals filed for this visit. There is no height or weight on file to calculate BMI.  Current Medications (verified) Outpatient Encounter Prescriptions as of 07/10/2016  Medication Sig  . albuterol (PROVENTIL HFA;VENTOLIN HFA) 108 (90 Base) MCG/ACT inhaler Inhale 2 puffs into the lungs every 6 (six) hours as needed for wheezing or shortness of breath.  Marland Kitchen amLODipine (NORVASC) 5 MG tablet Take 5 mg by mouth daily as needed (for blood pressure).   . brimonidine (ALPHAGAN) 0.15 % ophthalmic solution Place 1 drop into both eyes 2 (two) times daily.  . citalopram (CELEXA) 10 MG tablet TAKE 1 TABLET EVERY MORNING  . clotrimazole (LOTRIMIN) 1 % cream Apply 1 application topically 2 (two) times daily. Prescribed for rash in groin from [place] on 06/20/2014   . fluticasone (FLONASE) 50 MCG/ACT nasal spray USE 2 SPRAYS IN EACH NOSTRIL EVERY DAY AS NEEDED  FOR  RHINITIS/ALLERGIES (Patient taking differently: Place 2 sprays into both nostrils every evening. )  . gabapentin (NEURONTIN) 300 MG capsule TAKE 1 TO 2 CAPSULES FOUR TIMES DAILY  AFTER MEALS AND AT BEDTIME. (Patient taking differently: TAKE 2 CAPSULES FOUR TIMES DAILY  AFTER MEALS AND AT BEDTIME.)  . hydrochlorothiazide (HYDRODIURIL) 12.5 MG tablet Take 1 tablet (12.5 mg total) by mouth daily.    . hydroxypropyl methylcellulose / hypromellose (ISOPTO TEARS / GONIOVISC) 2.5 % ophthalmic solution Place 1 drop into both eyes 2 (two) times daily.  . influenza vac recom quadrivalent (FLUBLOK) 0.5 ML injection Inject 0.5 mLs into the muscle once.  Marland Kitchen losartan (COZAAR) 50 MG tablet Take 50 mg by mouth daily as needed (for blood pressure). Reported on 09/16/2015  . meclizine (ANTIVERT) 25 MG tablet TAKE 1 TABLET TWO TIMES DAILY AS NEEDED FOR DIZZINESS (Patient taking differently: Takes 25mg  by mouth at night)  . nitroGLYCERIN (NITROSTAT) 0.4 MG SL tablet Place 1 tablet (0.4 mg total) under the tongue every 5 (five) minutes as needed. Chest pain  . oxyCODONE-acetaminophen (PERCOCET) 10-325 MG tablet Take 1 tablet by mouth 2 (two) times daily as needed for pain.  . pantoprazole (PROTONIX) 40 MG tablet Take 40 mg by mouth 2 (two) times daily.   . potassium chloride 20 MEQ TBCR Take 20 mEq by mouth daily.  . primidone (MYSOLINE) 250 MG tablet TAKE 1/2 TABLET THREE TIMES DAILY  . simvastatin (ZOCOR) 40 MG tablet TAKE 1 AND 1/2 TABLETS AT BEDTIME  . tamsulosin (FLOMAX) 0.4 MG CAPS capsule Take 1 capsule (0.4 mg total) by mouth daily. (Patient taking differently: Take 0.8 mg by mouth at bedtime. )  . timolol (TIMOPTIC) 0.5 % ophthalmic solution Place 1 drop into both eyes 2 (two) times daily.   . Travoprost, BAK Free, (TRAVATAN Z) 0.004 % SOLN ophthalmic solution Place 1 drop into both eyes at  bedtime.  . triamcinolone (KENALOG) 0.025 % cream Apply 1 application topically 2 (two) times daily.   No facility-administered encounter medications on file as of 07/10/2016.     Allergies (verified) Morphine and related; Hydrochlorothiazide; Tape; Lisinopril; and Sulfa antibiotics   History: Past Medical History:  Diagnosis Date  . Acute bronchitis per pt no fever--  cough since discharge from hospital 07-02-2015   per pcp note 07-05-2015  mild to moderate bronchitis vs pna  . Allergic rhinitis   . Bladder  cancer ([place])   . BPH (benign prostatic hyperplasia)   . Bradycardia   . CAD (coronary artery disease)   . CHF (congestive heart failure) ([place])   . Chronic low back pain   . DDD (degenerative disc disease), lumbosacral   . Depression   . Diverticulosis of colon (without mention of hemorrhage)   . Essential tremor   . Fatty liver disease, nonalcoholic   . GERD (gastroesophageal reflux disease)   . Glaucoma   . History of bladder cancer    2004--  TCC low-grade , non-invasive  . History of colon polyps   . History of exercise stress test    02-05-2007-- ETT (Clinically positive, electrically equivocal, submaximal ETT,  appropriate bp response to exercise  . History of hiatal hernia   . History of kidney stones   . History of peptic ulcer   . HLD (hyperlipidemia)   . HTN (hypertension)   . Nocturia   . Productive cough   . Rheumatoid arthritis(714.0)   . Right renal artery stenosis ([facility]CC)    per cath 03-14-2009 --- 40-50%  . RLS (restless legs syndrome)   . Vertigo, intermittent   . Weak urinary stream   . Wears dentures    upper  . Wears glasses   . Wears hearing aid    bilateral  . Wears partial dentures    lower   Past Surgical History:  Procedure Laterality Date  . CARDIAC CATHETERIZATION  02-12-2007  dr gregg taylor   Non-obstructive CAD,  20% pLCX,  20% LAD, ef 65%  . CARDIAC CATHETERIZATION  03-14-2009  dr Johnsie Cancel   pLAD and mid diaginal 20-30% multiple lesions,  OM1 20%, right renal ostial 40-50%,  ef 60%  . CATARACT EXTRACTION W/ INTRAOCULAR LENS  IMPLANT, BILATERAL  2016  . CYSTOSCOPY WITH BIOPSY N/A 07/11/2015   Procedure: CYSTOSCOPY WITH COLD CUP RESECTION AND FULGERATION;  Surgeon: Rana Snare, MD;  [facility];  Service: Urology;  Laterality: N/A;  . HIP ARTHROSCOPY W/ LABRAL DEBRIDEMENT Left 06-29-2000   and chondraplasty  . LUMBAR [place] SURGERY  07-16-2005  . LUMBAR LAMINECTOMY/DECOMPRESSION MICRODISCECTOMY  07/09/2011    Procedure: LUMBAR LAMINECTOMY/DECOMPRESSION MICRODISCECTOMY;  Surgeon: Johnn Hai;  Location: [facility];  Service: Orthopedics;  Laterality: Left;  Decompression L5 - S1 on the Left and repair of dura  . LUMBAR LAMINECTOMY/DECOMPRESSION MICRODISCECTOMY N/A 07/27/2013   Procedure: DECOMPRESSION L4 - L5 WITH EXCISION OF SYNOVIAL CYST AND, LATERAL MASS FUSION 1 LEVEL;  Surgeon: Johnn Hai, MD;  Location: [facility];  Service: Orthopedics;  Laterality: N/A;  . LUMBAR LAMINECTOMY/DECOMPRESSION MICRODISCECTOMY Left 07/06/2014   Procedure:  MICRO LUMBAR DECOMPRESSION L5-S1 ON LEFT, L4-5 REDO;  Surgeon: Johnn Hai, MD;  Location: [facility];  Service: Orthopedics;  Laterality: Left;  . TONSILLECTOMY AND ADENOIDECTOMY    . TRANSTHORACIC ECHOCARDIOGRAM  09-20-2010   normal LVF, ef 55-65%/  mild MR, TR and PR/  mild LAE  . TRANSURETHRAL RESECTION OF BLADDER  TUMOR  09-21-2002   Family History  Problem Relation Age of Onset  . Heart attack Mother   . Cancer Father     lung  . Cancer Sister     lung  . Diabetes Sister   . Cancer Brother     lung that spread to brain   Social History   Occupational History  . retired from [employer] Retired   Social History Main Topics  . Smoking status: Former Smoker    Packs/day: 2.00    Years: 20.00    Types: Cigarettes    Quit date: 10/08/1982  . Smokeless tobacco: Never Used  . Alcohol use No  . Drug use: No  . Sexual activity: Not Currently   Tobacco Counseling Counseling given: Not Answered   Activities of Daily Living In your present state of health, do you have any difficulty performing the following activities: 05/22/2016 09/18/2015  Hearing? Tempie Donning  Vision? N N  Difficulty concentrating or making decisions? N N  Walking or climbing stairs? Y -  Dressing or bathing? N N  Doing errands, shopping? N -  Some recent data might be hidden    Immunizations and Health Maintenance Immunization History  Administered Date(s) Administered  .  Influenza Split 03/14/2012  . Influenza Whole 05/17/2007, 04/19/2013  . Influenza,inj,Quad PF,36+ Mos 04/05/2014, 06/04/2016  . Influenza-Unspecified 05/14/2015  . Pneumococcal Conjugate-13 05/16/2014  . Pneumococcal Polysaccharide-23 05/17/2007  . Tdap 11/28/2010  . Zoster 10/25/2013   There are no preventive care reminders to display for this patient.  Patient Care Team: Biagio Borg, MD as PCP - General  Indicate any recent Medical Services you may have received from other than [facility] providers in the past year (date may be approximate).    Assessment:   This is a routine wellness examination for [name]. Physical assessment deferred to PCP.   Hearing/Vision screen No exam data present  Dietary issues and exercise activities discussed:   Diet (meal preparation, eat out, water intake, caffeinated beverages, dairy products, fruits and vegetables):   Breakfast: Lunch:  Dinner:      Goals    None     Depression Screen PHQ 2/9 Scores 05/17/2015 10/20/2013  PHQ - 2 Score 1 0    Fall Risk Fall Risk  06/19/2016 05/17/2015 10/20/2013  Falls in the past year? No No No    Cognitive Function:        Screening Tests Health Maintenance  Topic Date Due  . TETANUS/TDAP  11/27/2020  . INFLUENZA VACCINE  Completed  . ZOSTAVAX  Completed  . PNA vac Low Risk Adult  Completed        Plan:     Continue to eat heart healthy diet (full of fruits, vegetables, whole grains, lean protein, water--limit salt, fat, and sugar intake) and increase physical activity as tolerated.  Continue doing brain stimulating activities (puzzles, reading, adult coloring books, staying active) to keep memory sharp.    During the course of the visit Hazaiah was educated and counseled about the following appropriate screening and preventive services:   Vaccines to include Pneumoccal, Influenza, Hepatitis B, Td, Zostavax, HCV  Colorectal cancer screening  Cardiovascular disease screening  Diabetes  screening  Glaucoma screening  Nutrition counseling  Prostate cancer screening  Smoking cessation counseling  Patient Instructions (the written plan) were given to the patient.   Gerilyn Nestle, RN   07/09/2016

## 2016-07-10 ENCOUNTER — Ambulatory Visit: Payer: Medicare Other | Admitting: Internal Medicine

## 2016-07-11 ENCOUNTER — Ambulatory Visit (INDEPENDENT_AMBULATORY_CARE_PROVIDER_SITE_OTHER): Payer: Medicare Other | Admitting: Internal Medicine

## 2016-07-11 DIAGNOSIS — R0600 Dyspnea, unspecified: Secondary | ICD-10-CM

## 2016-07-11 LAB — PULMONARY FUNCTION TEST
DL/VA % PRED: 68 %
DL/VA: 3.03 ml/min/mmHg/L
DLCO COR % PRED: 62 %
DLCO COR: 17.72 ml/min/mmHg
DLCO unc % pred: 61 %
DLCO unc: 17.36 ml/min/mmHg
FEF 25-75 POST: 1.04 L/s
FEF 25-75 Pre: 1.04 L/sec
FEF2575-%CHANGE-POST: 0 %
FEF2575-%PRED-PRE: 59 %
FEF2575-%Pred-Post: 59 %
FEV1-%CHANGE-POST: -2 %
FEV1-%Pred-Post: 74 %
FEV1-%Pred-Pre: 75 %
FEV1-Post: 1.88 L
FEV1-Pre: 1.92 L
FEV1FVC-%CHANGE-POST: -2 %
FEV1FVC-%PRED-PRE: 86 %
FEV6-%Change-Post: 0 %
FEV6-%PRED-PRE: 91 %
FEV6-%Pred-Post: 91 %
FEV6-POST: 3.05 L
FEV6-Pre: 3.02 L
FEV6FVC-%Change-Post: 0 %
FEV6FVC-%PRED-POST: 105 %
FEV6FVC-%Pred-Pre: 105 %
FVC-%CHANGE-POST: 0 %
FVC-%PRED-POST: 86 %
FVC-%PRED-PRE: 86 %
FVC-POST: 3.1 L
FVC-PRE: 3.09 L
POST FEV6/FVC RATIO: 98 %
PRE FEV1/FVC RATIO: 62 %
PRE FEV6/FVC RATIO: 98 %
Post FEV1/FVC ratio: 60 %
RV % pred: 118 %
RV: 2.92 L
TLC % PRED: 99 %
TLC: 6.44 L

## 2016-07-11 NOTE — Progress Notes (Signed)
PFT performed today. 

## 2016-08-04 ENCOUNTER — Other Ambulatory Visit: Payer: Self-pay | Admitting: Internal Medicine

## 2016-08-04 NOTE — Telephone Encounter (Signed)
Routing to dr Jenny Reichmann, I dont see wellness visit with you in several years---dec/2016 last eval for vertigo---are you ok with refilling---thanks

## 2016-08-05 ENCOUNTER — Other Ambulatory Visit: Payer: Self-pay | Admitting: *Deleted

## 2016-08-05 MED ORDER — AMLODIPINE BESYLATE 5 MG PO TABS: 5.0000 mg | ORAL_TABLET | Freq: Every day | ORAL | 1 refills | Status: DC

## 2016-08-08 DIAGNOSIS — M5414 Radiculopathy, thoracic region: Secondary | ICD-10-CM | POA: Diagnosis not present

## 2016-08-08 DIAGNOSIS — M792 Neuralgia and neuritis, unspecified: Secondary | ICD-10-CM | POA: Diagnosis not present

## 2016-08-08 DIAGNOSIS — G894 Chronic pain syndrome: Secondary | ICD-10-CM | POA: Diagnosis not present

## 2016-08-08 DIAGNOSIS — M5416 Radiculopathy, lumbar region: Secondary | ICD-10-CM | POA: Diagnosis not present

## 2016-08-14 ENCOUNTER — Other Ambulatory Visit: Payer: Self-pay | Admitting: Internal Medicine

## 2016-08-14 MED ORDER — HYDROCHLOROTHIAZIDE 25 MG PO TABS: 25.0000 mg | ORAL_TABLET | Freq: Every day | ORAL | 3 refills | Status: DC

## 2016-08-15 ENCOUNTER — Other Ambulatory Visit: Payer: Self-pay | Admitting: Internal Medicine

## 2016-08-15 DIAGNOSIS — M545 Low back pain: Secondary | ICD-10-CM | POA: Diagnosis not present

## 2016-08-15 DIAGNOSIS — M961 Postlaminectomy syndrome, not elsewhere classified: Secondary | ICD-10-CM | POA: Diagnosis not present

## 2016-08-19 DIAGNOSIS — M545 Low back pain: Secondary | ICD-10-CM | POA: Diagnosis not present

## 2016-08-19 DIAGNOSIS — M961 Postlaminectomy syndrome, not elsewhere classified: Secondary | ICD-10-CM | POA: Diagnosis not present

## 2016-08-25 ENCOUNTER — Ambulatory Visit: Payer: Self-pay | Admitting: Physician Assistant

## 2016-09-03 NOTE — Pre-Procedure Instructions (Signed)
[name]  09/03/2016      [facility] [place] [place] [address] Phone: [phone] Fax: [phone]  [facility] [address] [place] - [address] [place] [address] Phone: [phone] Fax: [phone]  [facility] [place], [place] [address] Phone: [phone] Fax: [phone]    Your procedure is scheduled on Thurs, Mar 1 @ 11:35 AM  Report to [facility] Admitting at 9:30 AM  Call this number if you have problems the morning of surgery:  [phone]   Remember:  Do not eat food or drink liquids after midnight.  Take these medicines the morning of surgery with A SIP OF WATER Albuterol<Bring Your Inhaler With You>,Amlodipine(Norvasc),Eye Drops,Celexa(Citalopram),Flonase(Fluticasone),Gabapentin(Neurontin),Meclizine(Antivert),Pain Pill(if needed),Pantroprazole(Protonix),and Mysoline(Primidone)               Stop taking any Vitamins or Herbal Medications now. Also no Goody's,BC's,Aleve,Advil,Motrin,Ibuprofen,Aspirin,or Fish Oil.    Do not wear jewelry.  Do not wear lotions, powders,colognes, or deoderant.  Men may shave face and neck.  Do not bring valuables to the hospital.  [facility] is not responsible for any belongings or valuables.  Contacts, dentures or bridgework may not be worn into surgery.  Leave your suitcase in the car.  After surgery it may be brought to your room.  For patients admitted to the hospital, discharge time will be determined by your treatment team.  Patients discharged the day of surgery will not be allowed to drive home.   [facility]  Before surgery, you can play an important role.  Because skin is not sterile, your skin needs to be as free of germs as possible.  You  can reduce the number of germs on you skin by washing with CHG (chlorahexidine gluconate) soap before surgery.  CHG is an antiseptic cleaner which kills germs and bonds with the skin to continue killing germs even after washing.  Please DO NOT use if you have an allergy to CHG or antibacterial soaps.  If your skin becomes reddened/irritated stop using the CHG and inform your nurse when you arrive at Short Stay.  Do not shave (including legs and underarms) for at least 48 hours prior to the first CHG shower.  You may shave your face.  Please follow these instructions carefully:   1.  Shower with CHG Soap the night before surgery and the                                morning of Surgery.  2.  If you choose to wash your hair, wash your hair first as usual with your       normal shampoo.  3.  After you shampoo, rinse your hair and body thoroughly to remove the                      Shampoo.  4.  Use CHG as you would any other liquid soap.  You can apply chg directly       to the skin and wash gently with scrungie or a clean washcloth.  5.  Apply the CHG Soap to your body ONLY FROM THE NECK DOWN.  Do not use on open wounds or open sores.  Avoid contact with your eyes,       ears, mouth and genitals (private parts).  Wash genitals (private parts)       with your normal soap.  6.  Wash thoroughly, paying special attention to the area where your surgery        will be performed.  7.  Thoroughly rinse your body with warm water from the neck down.  8.  DO NOT shower/wash with your normal soap after using and rinsing off       the CHG Soap.  9.  Pat yourself dry with a clean towel.            10.  Wear clean pajamas.            11.  Place clean sheets on your bed the night of your first shower and do not        sleep with pets.  Day of Surgery  Do not apply any lotions/deoderants the morning of surgery.  Please wear clean clothes to the hospital/surgery center.   Please read over the following  fact sheets that you were given. Pain Booklet, Coughing and Deep Breathing, MRSA Information and Surgical Site Infection Prevention

## 2016-09-04 ENCOUNTER — Encounter (HOSPITAL_COMMUNITY)
Admission: RE | Admit: 2016-09-04 | Discharge: 2016-09-04 | Disposition: A | Discharge status: Home / Self Care | Payer: Medicare Other | Source: Ambulatory Visit | Attending: Orthopedic Surgery | Admitting: Orthopedic Surgery

## 2016-09-04 ENCOUNTER — Encounter (HOSPITAL_COMMUNITY): Payer: Self-pay

## 2016-09-04 DIAGNOSIS — Z01812 Encounter for preprocedural laboratory examination: Secondary | ICD-10-CM | POA: Insufficient documentation

## 2016-09-04 DIAGNOSIS — F329 Major depressive disorder, single episode, unspecified: Secondary | ICD-10-CM | POA: Diagnosis not present

## 2016-09-04 DIAGNOSIS — I509 Heart failure, unspecified: Secondary | ICD-10-CM | POA: Insufficient documentation

## 2016-09-04 DIAGNOSIS — I10 Essential (primary) hypertension: Secondary | ICD-10-CM | POA: Diagnosis not present

## 2016-09-04 DIAGNOSIS — M069 Rheumatoid arthritis, unspecified: Secondary | ICD-10-CM | POA: Insufficient documentation

## 2016-09-04 DIAGNOSIS — K219 Gastro-esophageal reflux disease without esophagitis: Secondary | ICD-10-CM | POA: Insufficient documentation

## 2016-09-04 DIAGNOSIS — I2510 Atherosclerotic heart disease of native coronary artery without angina pectoris: Secondary | ICD-10-CM | POA: Diagnosis not present

## 2016-09-04 HISTORY — DX: Dyspnea, unspecified: R06.00

## 2016-09-04 LAB — BASIC METABOLIC PANEL
ANION GAP: 11 (ref 5–15)
BUN: 5 mg/dL — ABNORMAL LOW (ref 6–20)
CALCIUM: 9.3 mg/dL (ref 8.9–10.3)
CO2: 26 mmol/L (ref 22–32)
Chloride: 94 mmol/L — ABNORMAL LOW (ref 101–111)
Creatinine, Ser: 0.78 mg/dL (ref 0.61–1.24)
Glucose, Bld: 94 mg/dL (ref 65–99)
Potassium: 3.9 mmol/L (ref 3.5–5.1)
SODIUM: 131 mmol/L — AB (ref 135–145)

## 2016-09-04 LAB — CBC
HCT: 43.4 % (ref 39.0–52.0)
HEMOGLOBIN: 15.3 g/dL (ref 13.0–17.0)
MCH: 30.4 pg (ref 26.0–34.0)
MCHC: 35.3 g/dL (ref 30.0–36.0)
MCV: 86.1 fL (ref 78.0–100.0)
PLATELETS: 192 10*3/uL (ref 150–400)
RBC: 5.04 MIL/uL (ref 4.22–5.81)
RDW: 12.8 % (ref 11.5–15.5)
WBC: 5.6 10*3/uL (ref 4.0–10.5)

## 2016-09-04 LAB — SURGICAL PCR SCREEN
MRSA, PCR: NEGATIVE
STAPHYLOCOCCUS AUREUS: POSITIVE — AB

## 2016-09-04 LAB — GLUCOSE, CAPILLARY: Glucose-Capillary: 98 mg/dL (ref 65–99)

## 2016-09-11 ENCOUNTER — Ambulatory Visit (HOSPITAL_COMMUNITY): Payer: Medicare Other | Admitting: Certified Registered Nurse Anesthetist

## 2016-09-11 ENCOUNTER — Inpatient Hospital Stay (HOSPITAL_COMMUNITY)
Admission: RE | Admit: 2016-09-11 | Discharge: 2016-09-12 | DRG: 030 | Disposition: A | Discharge status: Home / Self Care | Payer: Medicare Other | Source: Ambulatory Visit | Attending: Orthopedic Surgery | Admitting: Orthopedic Surgery

## 2016-09-11 ENCOUNTER — Ambulatory Visit (HOSPITAL_COMMUNITY): Payer: Medicare Other

## 2016-09-11 ENCOUNTER — Encounter (HOSPITAL_COMMUNITY)
Admission: RE | Disposition: A | Discharge status: Home / Self Care | Payer: Self-pay | Source: Ambulatory Visit | Attending: Orthopedic Surgery

## 2016-09-11 ENCOUNTER — Encounter (HOSPITAL_COMMUNITY): Payer: Self-pay | Admitting: *Deleted

## 2016-09-11 DIAGNOSIS — Z6830 Body mass index (BMI) 30.0-30.9, adult: Secondary | ICD-10-CM

## 2016-09-11 DIAGNOSIS — G8929 Other chronic pain: Secondary | ICD-10-CM | POA: Diagnosis not present

## 2016-09-11 DIAGNOSIS — Z981 Arthrodesis status: Secondary | ICD-10-CM | POA: Diagnosis not present

## 2016-09-11 DIAGNOSIS — K760 Fatty (change of) liver, not elsewhere classified: Secondary | ICD-10-CM | POA: Diagnosis present

## 2016-09-11 DIAGNOSIS — J309 Allergic rhinitis, unspecified: Secondary | ICD-10-CM | POA: Diagnosis not present

## 2016-09-11 DIAGNOSIS — E669 Obesity, unspecified: Secondary | ICD-10-CM | POA: Diagnosis present

## 2016-09-11 DIAGNOSIS — I509 Heart failure, unspecified: Secondary | ICD-10-CM | POA: Diagnosis present

## 2016-09-11 DIAGNOSIS — Z8249 Family history of ischemic heart disease and other diseases of the circulatory system: Secondary | ICD-10-CM

## 2016-09-11 DIAGNOSIS — G894 Chronic pain syndrome: Secondary | ICD-10-CM | POA: Diagnosis not present

## 2016-09-11 DIAGNOSIS — Z419 Encounter for procedure for purposes other than remedying health state, unspecified: Secondary | ICD-10-CM

## 2016-09-11 DIAGNOSIS — M5442 Lumbago with sciatica, left side: Secondary | ICD-10-CM | POA: Diagnosis not present

## 2016-09-11 DIAGNOSIS — G2581 Restless legs syndrome: Secondary | ICD-10-CM | POA: Diagnosis present

## 2016-09-11 DIAGNOSIS — I2510 Atherosclerotic heart disease of native coronary artery without angina pectoris: Secondary | ICD-10-CM | POA: Diagnosis present

## 2016-09-11 DIAGNOSIS — M961 Postlaminectomy syndrome, not elsewhere classified: Secondary | ICD-10-CM | POA: Diagnosis not present

## 2016-09-11 DIAGNOSIS — I739 Peripheral vascular disease, unspecified: Secondary | ICD-10-CM | POA: Diagnosis present

## 2016-09-11 DIAGNOSIS — E785 Hyperlipidemia, unspecified: Secondary | ICD-10-CM | POA: Diagnosis present

## 2016-09-11 DIAGNOSIS — Z87891 Personal history of nicotine dependence: Secondary | ICD-10-CM | POA: Diagnosis not present

## 2016-09-11 DIAGNOSIS — I110 Hypertensive heart disease with heart failure: Secondary | ICD-10-CM | POA: Diagnosis present

## 2016-09-11 DIAGNOSIS — K219 Gastro-esophageal reflux disease without esophagitis: Secondary | ICD-10-CM | POA: Diagnosis present

## 2016-09-11 DIAGNOSIS — M5136 Other intervertebral disc degeneration, lumbar region: Secondary | ICD-10-CM | POA: Diagnosis not present

## 2016-09-11 DIAGNOSIS — M069 Rheumatoid arthritis, unspecified: Secondary | ICD-10-CM | POA: Diagnosis present

## 2016-09-11 DIAGNOSIS — Z8551 Personal history of malignant neoplasm of bladder: Secondary | ICD-10-CM | POA: Diagnosis not present

## 2016-09-11 DIAGNOSIS — H409 Unspecified glaucoma: Secondary | ICD-10-CM | POA: Diagnosis present

## 2016-09-11 DIAGNOSIS — Z462 Encounter for fitting and adjustment of other devices related to nervous system and special senses: Secondary | ICD-10-CM | POA: Diagnosis not present

## 2016-09-11 HISTORY — PX: SPINAL CORD STIMULATOR INSERTION: SHX5378

## 2016-09-11 SURGERY — INSERTION, SPINAL CORD STIMULATOR, LUMBAR
Anesthesia: General | Site: Spine Lumbar

## 2016-09-11 MED ORDER — PROPOFOL 10 MG/ML IV BOLUS
INTRAVENOUS | Status: AC
  Filled 2016-09-11: qty 20

## 2016-09-11 MED ORDER — ACETAMINOPHEN 325 MG PO TABS: 650.0000 mg | ORAL_TABLET | Freq: Four times a day (QID) | ORAL | Status: DC | PRN

## 2016-09-11 MED ORDER — SUCCINYLCHOLINE 20MG/ML (10ML) SYRINGE FOR MEDFUSION PUMP - OPTIME
INTRAMUSCULAR | Status: DC | PRN
  Administered 2016-09-11: 100 mg via INTRAVENOUS

## 2016-09-11 MED ORDER — GLYCOPYRROLATE 0.2 MG/ML IJ SOLN
INTRAMUSCULAR | Status: DC | PRN
  Administered 2016-09-11: 0.2 mg via INTRAVENOUS

## 2016-09-11 MED ORDER — POTASSIUM CHLORIDE CRYS ER 10 MEQ PO TBCR
40.0000 meq | EXTENDED_RELEASE_TABLET | Freq: Every day | ORAL | Status: DC
Start: 2016-09-11 — End: 2016-09-12

## 2016-09-11 MED ORDER — HYDROMORPHONE HCL 1 MG/ML IJ SOLN
INTRAMUSCULAR | Status: AC
  Administered 2016-09-11: 0.5 mg
  Filled 2016-09-11: qty 0.5

## 2016-09-11 MED ORDER — LACTATED RINGERS IV SOLN
INTRAVENOUS | Status: DC
  Administered 2016-09-11 (×2): via INTRAVENOUS

## 2016-09-11 MED ORDER — DEXAMETHASONE SODIUM PHOSPHATE 10 MG/ML IJ SOLN
INTRAMUSCULAR | Status: DC | PRN
  Administered 2016-09-11: 10 mg via INTRAVENOUS

## 2016-09-11 MED ORDER — DEXAMETHASONE 4 MG PO TABS
4.0000 mg | ORAL_TABLET | Freq: Four times a day (QID) | ORAL | Status: DC
  Administered 2016-09-11 – 2016-09-12 (×3): 4 mg via ORAL
  Filled 2016-09-11 (×3): qty 1

## 2016-09-11 MED ORDER — EPINEPHRINE PF 1 MG/ML IJ SOLN
INTRAMUSCULAR | Status: AC
Start: 2016-09-11 — End: 2016-09-11
  Filled 2016-09-11: qty 1

## 2016-09-11 MED ORDER — ACETAMINOPHEN 10 MG/ML IV SOLN
INTRAVENOUS | Status: DC | PRN
  Administered 2016-09-11: 1000 mg via INTRAVENOUS

## 2016-09-11 MED ORDER — METHOCARBAMOL 500 MG PO TABS
500.0000 mg | ORAL_TABLET | Freq: Four times a day (QID) | ORAL | Status: DC | PRN
  Administered 2016-09-11 – 2016-09-12 (×2): 500 mg via ORAL
  Filled 2016-09-11 (×2): qty 1

## 2016-09-11 MED ORDER — BUPIVACAINE HCL (PF) 0.25 % IJ SOLN
INTRAMUSCULAR | Status: AC
  Filled 2016-09-11: qty 30

## 2016-09-11 MED ORDER — ALBUTEROL SULFATE (2.5 MG/3ML) 0.083% IN NEBU
3.0000 mL | INHALATION_SOLUTION | Freq: Four times a day (QID) | RESPIRATORY_TRACT | Status: DC | PRN
Start: 2016-09-11 — End: 2016-09-12

## 2016-09-11 MED ORDER — HYDROCHLOROTHIAZIDE 25 MG PO TABS: 25.0000 mg | ORAL_TABLET | Freq: Every day | ORAL | Status: DC

## 2016-09-11 MED ORDER — HYDROMORPHONE HCL 1 MG/ML IJ SOLN: 0.2500 mg | INTRAMUSCULAR | Status: DC | PRN

## 2016-09-11 MED ORDER — 0.9 % SODIUM CHLORIDE (POUR BTL) OPTIME
TOPICAL | Status: DC | PRN
  Administered 2016-09-11: 1000 mL

## 2016-09-11 MED ORDER — LIDOCAINE HCL (CARDIAC) 20 MG/ML IV SOLN
INTRAVENOUS | Status: DC | PRN
  Administered 2016-09-11: 60 mg via INTRAVENOUS

## 2016-09-11 MED ORDER — BUPIVACAINE-EPINEPHRINE 0.25% -1:200000 IJ SOLN
INTRAMUSCULAR | Status: DC | PRN
  Administered 2016-09-11: 10 mL

## 2016-09-11 MED ORDER — METHOCARBAMOL 1000 MG/10ML IJ SOLN
500.0000 mg | Freq: Four times a day (QID) | INTRAVENOUS | Status: DC | PRN
  Filled 2016-09-11: qty 5

## 2016-09-11 MED ORDER — ONDANSETRON HCL 4 MG/2ML IJ SOLN
INTRAMUSCULAR | Status: DC | PRN
  Administered 2016-09-11: 4 mg via INTRAVENOUS

## 2016-09-11 MED ORDER — POLYETHYLENE GLYCOL 3350 17 G PO PACK: 17.0000 g | PACK | Freq: Every day | ORAL | Status: DC | PRN

## 2016-09-11 MED ORDER — LOSARTAN POTASSIUM 50 MG PO TABS: 50.0000 mg | ORAL_TABLET | Freq: Every day | ORAL | Status: DC | PRN

## 2016-09-11 MED ORDER — OXYCODONE-ACETAMINOPHEN 10-325 MG PO TABS: 1.0000 | ORAL_TABLET | ORAL | 0 refills | Status: DC | PRN

## 2016-09-11 MED ORDER — FENTANYL CITRATE (PF) 100 MCG/2ML IJ SOLN
INTRAMUSCULAR | Status: AC
Start: 2016-09-11 — End: 2016-09-11
  Filled 2016-09-11: qty 4

## 2016-09-11 MED ORDER — CEFAZOLIN SODIUM-DEXTROSE 2-4 GM/100ML-% IV SOLN
2.0000 g | INTRAVENOUS | Status: AC
  Administered 2016-09-11: 2 g via INTRAVENOUS
  Filled 2016-09-11: qty 100

## 2016-09-11 MED ORDER — GABAPENTIN 300 MG PO CAPS
300.0000 mg | ORAL_CAPSULE | Freq: Three times a day (TID) | ORAL | Status: DC
  Administered 2016-09-11: 300 mg via ORAL
  Filled 2016-09-11: qty 1

## 2016-09-11 MED ORDER — CEFAZOLIN SODIUM-DEXTROSE 2-4 GM/100ML-% IV SOLN
2.0000 g | Freq: Three times a day (TID) | INTRAVENOUS | Status: AC
  Administered 2016-09-11 – 2016-09-12 (×2): 2 g via INTRAVENOUS
  Filled 2016-09-11 (×2): qty 100

## 2016-09-11 MED ORDER — TAMSULOSIN HCL 0.4 MG PO CAPS
0.8000 mg | ORAL_CAPSULE | Freq: Every day | ORAL | Status: DC
  Administered 2016-09-11: 0.8 mg via ORAL
  Filled 2016-09-11: qty 2

## 2016-09-11 MED ORDER — PROPOFOL 500 MG/50ML IV EMUL
INTRAVENOUS | Status: DC | PRN
Start: 2016-09-11 — End: 2016-09-11
  Administered 2016-09-11: 50 ug/kg/min via INTRAVENOUS

## 2016-09-11 MED ORDER — ONDANSETRON HCL 4 MG PO TABS: 4.0000 mg | ORAL_TABLET | Freq: Three times a day (TID) | ORAL | 0 refills | Status: DC | PRN

## 2016-09-11 MED ORDER — SODIUM CHLORIDE 0.9% FLUSH: 3.0000 mL | INTRAVENOUS | Status: DC | PRN

## 2016-09-11 MED ORDER — ACETAMINOPHEN 650 MG RE SUPP: 650.0000 mg | Freq: Four times a day (QID) | RECTAL | Status: DC | PRN

## 2016-09-11 MED ORDER — PRIMIDONE 250 MG PO TABS
250.0000 mg | ORAL_TABLET | Freq: Every day | ORAL | Status: DC
  Administered 2016-09-11: 250 mg via ORAL
  Filled 2016-09-11: qty 1

## 2016-09-11 MED ORDER — FENTANYL CITRATE (PF) 100 MCG/2ML IJ SOLN
INTRAMUSCULAR | Status: DC | PRN
  Administered 2016-09-11 (×4): 50 ug via INTRAVENOUS

## 2016-09-11 MED ORDER — DEXAMETHASONE SODIUM PHOSPHATE 4 MG/ML IJ SOLN: 4.0000 mg | Freq: Four times a day (QID) | INTRAMUSCULAR | Status: DC

## 2016-09-11 MED ORDER — OXYCODONE HCL 5 MG PO TABS
10.0000 mg | ORAL_TABLET | ORAL | Status: DC | PRN
  Administered 2016-09-11 – 2016-09-12 (×3): 10 mg via ORAL
  Filled 2016-09-11 (×3): qty 2

## 2016-09-11 MED ORDER — PANTOPRAZOLE SODIUM 40 MG PO TBEC
40.0000 mg | DELAYED_RELEASE_TABLET | Freq: Two times a day (BID) | ORAL | Status: DC
  Administered 2016-09-11: 40 mg via ORAL
  Filled 2016-09-11: qty 1

## 2016-09-11 MED ORDER — PHENOL 1.4 % MT LIQD: 1.0000 | OROMUCOSAL | Status: DC | PRN

## 2016-09-11 MED ORDER — ACETAMINOPHEN 650 MG RE SUPP: 650.0000 mg | RECTAL | Status: DC | PRN

## 2016-09-11 MED ORDER — AMLODIPINE BESYLATE 5 MG PO TABS
5.0000 mg | ORAL_TABLET | Freq: Every day | ORAL | Status: DC | PRN
  Filled 2016-09-11: qty 1

## 2016-09-11 MED ORDER — NITROGLYCERIN 0.4 MG SL SUBL: 0.4000 mg | SUBLINGUAL_TABLET | SUBLINGUAL | Status: DC | PRN

## 2016-09-11 MED ORDER — MENTHOL 3 MG MT LOZG: 1.0000 | LOZENGE | OROMUCOSAL | Status: DC | PRN

## 2016-09-11 MED ORDER — METHOCARBAMOL 500 MG PO TABS: 500.0000 mg | ORAL_TABLET | Freq: Three times a day (TID) | ORAL | 0 refills | Status: DC | PRN

## 2016-09-11 MED ORDER — EPHEDRINE SULFATE 50 MG/ML IJ SOLN
INTRAMUSCULAR | Status: DC | PRN
  Administered 2016-09-11 (×3): 5 mg via INTRAVENOUS

## 2016-09-11 MED ORDER — POLYVINYL ALCOHOL 1.4 % OP SOLN
1.0000 [drp] | Freq: Two times a day (BID) | OPHTHALMIC | Status: DC
  Administered 2016-09-11: 1 [drp] via OPHTHALMIC
  Filled 2016-09-11: qty 15

## 2016-09-11 MED ORDER — ACETAMINOPHEN 10 MG/ML IV SOLN
INTRAVENOUS | Status: AC
  Filled 2016-09-11: qty 100

## 2016-09-11 MED ORDER — ACETAMINOPHEN 325 MG PO TABS: 650.0000 mg | ORAL_TABLET | ORAL | Status: DC | PRN

## 2016-09-11 MED ORDER — THROMBIN 20000 UNITS EX SOLR
CUTANEOUS | Status: AC
  Filled 2016-09-11: qty 20000

## 2016-09-11 MED ORDER — PROPOFOL 10 MG/ML IV BOLUS
INTRAVENOUS | Status: DC | PRN
  Administered 2016-09-11: 120 mg via INTRAVENOUS

## 2016-09-11 MED ORDER — CITALOPRAM HYDROBROMIDE 10 MG PO TABS
10.0000 mg | ORAL_TABLET | Freq: Every morning | ORAL | Status: DC
  Filled 2016-09-11 (×2): qty 1

## 2016-09-11 MED ORDER — SODIUM CHLORIDE 0.9% FLUSH: 3.0000 mL | Freq: Two times a day (BID) | INTRAVENOUS | Status: DC

## 2016-09-11 MED ORDER — LACTATED RINGERS IV SOLN: INTRAVENOUS | Status: DC

## 2016-09-11 MED ORDER — DEXTROSE 5 % IV SOLN
INTRAVENOUS | Status: DC | PRN
  Administered 2016-09-11: 30 ug/min via INTRAVENOUS

## 2016-09-11 MED ORDER — ONDANSETRON HCL 4 MG/2ML IJ SOLN: 4.0000 mg | INTRAMUSCULAR | Status: DC | PRN

## 2016-09-11 MED ORDER — BRIMONIDINE TARTRATE 0.15 % OP SOLN
1.0000 [drp] | Freq: Two times a day (BID) | OPHTHALMIC | Status: DC
  Administered 2016-09-11: 1 [drp] via OPHTHALMIC
  Filled 2016-09-11: qty 5

## 2016-09-11 MED ORDER — ACETAMINOPHEN 10 MG/ML IV SOLN
1000.0000 mg | Freq: Four times a day (QID) | INTRAVENOUS | Status: DC
  Administered 2016-09-11 – 2016-09-12 (×3): 1000 mg via INTRAVENOUS
  Filled 2016-09-11 (×3): qty 100

## 2016-09-11 SURGICAL SUPPLY — 57 items
CANISTER SUCT 3000ML PPV (MISCELLANEOUS) ×3 IMPLANT
CLOSURE STERI-STRIP 1/2X4 (GAUZE/BANDAGES/DRESSINGS) ×1
CLSR STERI-STRIP ANTIMIC 1/2X4 (GAUZE/BANDAGES/DRESSINGS) ×2 IMPLANT
CONTROLLER PROCLAIM IPG E5 PAT (Neuro Prosthesis/Implant) ×3 IMPLANT
CORDS BIPOLAR (ELECTRODE) ×3 IMPLANT
COVER MAYO STAND STRL (DRAPES) ×6 IMPLANT
COVER PROBE W GEL 5X96 (DRAPES) ×3 IMPLANT
COVER SURGICAL LIGHT HANDLE (MISCELLANEOUS) ×3 IMPLANT
DRAPE C-ARM 42X72 X-RAY (DRAPES) ×3 IMPLANT
DRAPE INCISE IOBAN 85X60 (DRAPES) ×3 IMPLANT
DRAPE SURG 17X23 STRL (DRAPES) ×3 IMPLANT
DRAPE U-SHAPE 47X51 STRL (DRAPES) ×3 IMPLANT
DRSG AQUACEL AG ADV 3.5X 6 (GAUZE/BANDAGES/DRESSINGS) ×3 IMPLANT
DURAPREP 26ML APPLICATOR (WOUND CARE) ×3 IMPLANT
ELECT BLADE 4.0 EZ CLEAN MEGAD (MISCELLANEOUS) ×3
ELECT CAUTERY BLADE 6.4 (BLADE) ×3 IMPLANT
ELECT PENCIL ROCKER SW 15FT (MISCELLANEOUS) ×3 IMPLANT
ELECT REM PT RETURN 9FT ADLT (ELECTROSURGICAL) ×3
ELECTRODE BLDE 4.0 EZ CLN MEGD (MISCELLANEOUS) ×1 IMPLANT
ELECTRODE REM PT RTRN 9FT ADLT (ELECTROSURGICAL) ×1 IMPLANT
GLOVE BIO SURGEON STRL SZ7 (GLOVE) ×3 IMPLANT
GLOVE BIOGEL PI IND STRL 7.0 (GLOVE) ×1 IMPLANT
GLOVE BIOGEL PI IND STRL 8.5 (GLOVE) ×1 IMPLANT
GLOVE BIOGEL PI INDICATOR 7.0 (GLOVE) ×2
GLOVE BIOGEL PI INDICATOR 8.5 (GLOVE) ×2
GLOVE SS N UNI LF 8.5 STRL (GLOVE) ×6 IMPLANT
GOWN STRL REUS W/ TWL LRG LVL3 (GOWN DISPOSABLE) ×2 IMPLANT
GOWN STRL REUS W/TWL 2XL LVL3 (GOWN DISPOSABLE) ×3 IMPLANT
GOWN STRL REUS W/TWL LRG LVL3 (GOWN DISPOSABLE) ×4
KIT BASIN OR (CUSTOM PROCEDURE TRAY) ×3 IMPLANT
KIT ROOM TURNOVER OR (KITS) ×3 IMPLANT
LAMI NARROW PRIPOLE 16CH (Neuro Prosthesis/Implant) ×3 IMPLANT
NEEDLE 22X1 1/2 (OR ONLY) (NEEDLE) ×3 IMPLANT
NEEDLE SPNL 18GX3.5 QUINCKE PK (NEEDLE) ×9 IMPLANT
NS IRRIG 1000ML POUR BTL (IV SOLUTION) ×3 IMPLANT
PACK LAMINECTOMY ORTHO (CUSTOM PROCEDURE TRAY) ×3 IMPLANT
PACK UNIVERSAL I (CUSTOM PROCEDURE TRAY) ×3 IMPLANT
PAD ARMBOARD 7.5X6 YLW CONV (MISCELLANEOUS) ×6 IMPLANT
SPATULA SILICONE BRAIN 10MM (MISCELLANEOUS) IMPLANT
SPONGE LAP 4X18 X RAY DECT (DISPOSABLE) ×6 IMPLANT
SPONGE SURGIFOAM ABS GEL 100 (HEMOSTASIS) ×3 IMPLANT
STAPLER VISISTAT 35W (STAPLE) ×3 IMPLANT
SURGIFLO W/THROMBIN 8M KIT (HEMOSTASIS) ×3 IMPLANT
SUT BONE WAX W31G (SUTURE) ×3 IMPLANT
SUT ETHIBOND NAB CT1 #1 30IN (SUTURE) ×3 IMPLANT
SUT FIBERWIRE #2 38 T-5 BLUE (SUTURE)
SUT MNCRL AB 3-0 PS2 18 (SUTURE) ×6 IMPLANT
SUT VIC AB 1 CT1 18XCR BRD 8 (SUTURE) ×2 IMPLANT
SUT VIC AB 1 CT1 8-18 (SUTURE) ×4
SUT VIC AB 2-0 CT1 18 (SUTURE) ×6 IMPLANT
SUTURE FIBERWR #2 38 T-5 BLUE (SUTURE) IMPLANT
SYR BULB IRRIGATION 50ML (SYRINGE) ×3 IMPLANT
SYR CONTROL 10ML LL (SYRINGE) ×3 IMPLANT
TOWEL OR 17X24 6PK STRL BLUE (TOWEL DISPOSABLE) ×3 IMPLANT
TOWEL OR 17X26 10 PK STRL BLUE (TOWEL DISPOSABLE) ×3 IMPLANT
TRAY FOLEY W/METER SILVER 16FR (SET/KITS/TRAYS/PACK) IMPLANT
WATER STERILE IRR 1000ML POUR (IV SOLUTION) IMPLANT

## 2016-09-11 NOTE — Anesthesia Preprocedure Evaluation (Addendum)
Anesthesia Evaluation  Patient identified by MRN, date of birth, ID band Patient awake    Reviewed: Allergy & Precautions, NPO status , Patient's Chart, lab work & pertinent test results  Airway Mallampati: II  TM Distance: >3 FB Neck ROM: Full    Dental  (+) Upper Dentures, Lower Dentures   Pulmonary former smoker,    breath sounds clear to auscultation       Cardiovascular hypertension, Pt. on medications + CAD, + Peripheral Vascular Disease and +CHF   Rhythm:Regular Rate:Normal     Neuro/Psych PSYCHIATRIC DISORDERS Depression  Neuromuscular disease    GI/Hepatic Neg liver ROS, hiatal hernia, GERD  Medicated,  Endo/Other  negative endocrine ROS  Renal/GU Renal disease  negative genitourinary   Musculoskeletal  (+) Arthritis , Osteoarthritis,    Abdominal (+) + obese,  Abdomen: soft.    Peds negative pediatric ROS (+)  Hematology negative hematology ROS (+)   Anesthesia Other Findings - Bladder Cancer - Fatty Liver  Reproductive/Obstetrics negative OB ROS                            Lab Results  Component Value Date   WBC 5.6 09/04/2016   HGB 15.3 09/04/2016   HCT 43.4 09/04/2016   MCV 86.1 09/04/2016   PLT 192 09/04/2016   Lab Results  Component Value Date   CREATININE 0.78 09/04/2016   BUN 5 (L) 09/04/2016   NA 131 (L) 09/04/2016   K 3.9 09/04/2016   CL 94 (L) 09/04/2016   CO2 26 09/04/2016   Lab Results  Component Value Date   INR 1.10 07/01/2015   INR 1.07 01/06/2013   INR 1.02 07/03/2011   EKG: normal sinus rhythm.  Echo - Left ventricle: The cavity size was normal. Wall thickness was   normal. Systolic function was normal. The estimated ejection   fraction was in the range of 55% to 60%. Wall motion was normal;   there were no regional wall motion abnormalities. - Left atrium: The atrium was mildly dilated.   Anesthesia Physical Anesthesia Plan  ASA:  III  Anesthesia Plan: General   Post-op Pain Management:    Induction: Intravenous and Rapid sequence  Airway Management Planned: Oral ETT  Additional Equipment:   Intra-op Plan:   Post-operative Plan: Extubation in OR  Informed Consent: I have reviewed the patients History and Physical, chart, labs and discussed the procedure including the risks, benefits and alternatives for the proposed anesthesia with the patient or authorized representative who has indicated his/her understanding and acceptance.   Dental advisory given  Plan Discussed with: CRNA  Anesthesia Plan Comments:         Anesthesia Quick Evaluation

## 2016-09-11 NOTE — H&P (Signed)
History of Present Illness The patient is a 81 year old male who presents for a follow-up for H & P. The patient is scheduled for a SCS placement to be performed by Dr. Duane Lope D. Rolena Infante, MD at [facility] on 09-11-16 . Please see the hospital record for complete dictated history and physical. Pt reports a remote dx of CHF.    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 Cancer  Father, Sister, Brother. First Degree Relatives   Social History  Tobacco use  Former smoker. former smoker Exercise  Exercises rarely Current work status  retired Alcohol use  current drinker; only occasionally per week Children  2 Pain Contract  no Tobacco / smoke exposure  no  Medication History Percocet (10-325MG  Tablet, 1 (one) Oral two times daily, as needed, Taken starting 09/03/2016) Active. Tylenol (500MG  Capsule, Oral) Active. Lisinopril (10MG  Tablet, Oral) Active. AmLODIPine Besylate (5MG  Tablet, Oral) Active. ZyrTEC Allergy (Oral) Specific strength unknown - Active. Citalopram Hydrobromide (10MG  Tablet, Oral) Active. Clobetasol & Clobetasol Emul (External) Specific strength unknown - Active. Fluticasone Propionate (50MCG/ACT Suspension, Nasal) Active. Gabapentin (300MG  Capsule, Oral) Active. Omeprazole (20MG  Capsule DR, Oral) Active. Primidone (250MG  Tablet, Oral) Active. Ranitidine HCl (300MG  Tablet,  Oral) Active. Simvastatin (40MG  Tablet, Oral) Active. Tamsulosin HCl (0.4MG  Capsule, Oral) Active. TobraDex (0.3-0.1% Ointment, Ophthalmic) Active. Nitroglycerin (0.4MG  Tab Sublingual, Sublingual) Active. Aspirin EC (81MG  Tablet DR, Oral) Active. Medications Reconciled HydroCHLOROthiazide (25MG  Tablet, Oral) Active. Mupirocin (2% Ointment, External) Active. Timolol Maleate (0.5% Solution, Ophthalmic) Active. Meclizine HCl (25MG  Tablet, Oral) Active.  Past Surgical History Spinal Surgery  Hip Fracture and Surgery  left Vasectomy  Tonsillectomy  Cataract Surgery  right  Vitals 09/08/2016 9:25 AM Weight: 190 lb Height: 66in Body Surface Area: 1.96 m Body Mass Index: 30.67 kg/m  Temp.: 97.81F(Oral)  Pulse: 57 (Regular)  BP: 164/83 (Sitting, Left Arm, Standard)   General General Appearance-Not in acute distress. Orientation-Oriented X3. Build & Nutrition-Well nourished and Well developed.  Integumentary General Characteristics Surgical Scars - no surgical scar evidence of previous lumbar surgery. Lumbar Spine-Skin examination of the lumbar spine is without deformity, skin lesions, lacerations or abrasions.  Chest and Lung Exam Auscultation Breath sounds - Normal and Clear.  Cardiovascular Auscultation Rhythm - Regular rate and rhythm.  Abdomen Palpation/Percussion Palpation and Percussion of the abdomen reveal - Soft, Non Tender and No Rebound tenderness.  Peripheral Vascular Lower Extremity Palpation - Posterior tibial pulse - Bilateral - 2+. Dorsalis pedis pulse - Bilateral - 2+.  Neurologic Sensation Lower Extremity - Left - sensation is intact in the lower extremity. Right - sensation is diminished in the lower extremity. Reflexes Patellar Reflex - Bilateral - 2+. Achilles Reflex - Bilateral - 2+. Clonus - Bilateral - clonus not present. Hoffman's Sign - Bilateral - Hoffman's sign not present. Testing Seated Straight Leg  Raise - Bilateral - Seated straight leg raise positive.  Musculoskeletal Spine/Ribs/Pelvis  Lumbosacral Spine: Inspection and Palpation - Tenderness - right lumbar paraspinals tender to palpation. Strength and Tone: Strength - Hip Flexion - Bilateral - 5/5. Knee Extension - Bilateral -  5/5. Knee Flexion - Bilateral - 5/5. Ankle Dorsiflexion - Bilateral - 5/5. Ankle Plantarflexion - Bilateral - 5/5. ROM - Flexion - moderately decreased range of motion and painful. Extension - moderately decreased range of motion and painful. Left Lateral Bending - moderately decreased range of motion and painful. Right Lateral Bending - moderately decreased range of motion and painful. Right Rotation - moderately decreased range of motion and painful. Left Rotation - moderately decreased range of motion and painful. Pain - neither flexion or extension is more painful than the other. Lumbosacral Spine - Waddell's Signs - no Waddell's signs present. Lower Extremity Range of Motion - No true hip, knee or ankle pain with range of motion. Gait and Station - [employer] - no assistive devices.   Assessment & Plan Goal Of Surgery: Discussed that goal of surgery is to reduce pain and improve function and quality of life. Patient is aware that despite all appropriate treatment that there pain and function could be the same, worse, or different.  Risks include: infection, bleeding, nerve damage, death, stroke, paralysis, migration of the leads, battery failure, need for additional surgery, on going or worse pain.  At this point, the patient had a successful spinal cord stimulator trial with reduced pain and improved quality of life. His thoracic MRI shows no significant stenosis. There is an area of focal gliosis at T11. Clinical significance is unknown. At this point, there is no signs clinically of myelopathy. He has normal gait pattern. Negative Babinski test. No clonus. Symmetrical deep tendon reflexes. At this point,  we have gone over the procedure and the risks which include infection, bleeding, nerve damage, death, stroke, paralysis, failure to heal, need for further surgery, ongoing or worse pain, migration of the lead, bleeding, blood clots, hardware failure, need for additional surgery to replace malfunctioning battery or leads. All of his questions were addressed.

## 2016-09-11 NOTE — Anesthesia Procedure Notes (Signed)
Performed by: Matvey Llanas B       

## 2016-09-11 NOTE — Anesthesia Procedure Notes (Signed)
Procedure Name: Intubation Date/Time: 09/11/2016 11:48 AM Performed by: Oletta Lamas Pre-anesthesia Checklist: Patient identified, Emergency Drugs available, Suction available, Patient being monitored and Timeout performed Patient Re-evaluated:Patient Re-evaluated prior to inductionOxygen Delivery Method: Circle system utilized Preoxygenation: Pre-oxygenation with 100% oxygen Intubation Type: IV induction Ventilation: Oral airway inserted - appropriate to patient size Laryngoscope Size: Mac and 4 Grade View: Grade I Tube type: Oral Tube size: 7.5 mm Number of attempts: 1 Airway Equipment and Method: Stylet and Oral airway Placement Confirmation: ETT inserted through vocal cords under direct vision,  positive ETCO2 and breath sounds checked- equal and bilateral Secured at: 22 cm Tube secured with: Tape Dental Injury: Teeth and Oropharynx as per pre-operative assessment  Comments: Intubated by C. Suzan Slick, SRNA under supervision.

## 2016-09-11 NOTE — Anesthesia Postprocedure Evaluation (Addendum)
Anesthesia Post Note  Patient: [name]  Procedure(s) Performed: Procedure(s) (LRB): Spinal cord stimulator placement (N/A)  Patient location during evaluation: PACU Anesthesia Type: General Level of consciousness: awake and alert Pain management: pain level controlled Vital Signs Assessment: post-procedure vital signs reviewed and stable Respiratory status: spontaneous breathing, nonlabored ventilation, respiratory function stable and patient connected to nasal cannula oxygen Cardiovascular status: blood pressure returned to baseline and stable Postop Assessment: no signs of nausea or vomiting Anesthetic complications: no       Last Vitals:  Vitals:   09/11/16 1455 09/11/16 1544  BP: 139/73 (!) 142/71  Pulse: (!) 49 (!) 51  Resp: 12 16  Temp: 36.3 C 36.3 C    Last Pain:  Vitals:   09/11/16 1455  TempSrc:   PainSc: 3       LLE Sensation: Full sensation (09/11/16 1604)   RLE Sensation: Full sensation (09/11/16 1604)      Effie Berkshire

## 2016-09-11 NOTE — Brief Op Note (Signed)
09/11/2016  1:42 PM  PATIENT:  [name]  81 y.o. male  PRE-OPERATIVE DIAGNOSIS:  Failed back syndrome, chronic pain syndrome  POST-OPERATIVE DIAGNOSIS:  Failed back syndrom, chronic pain syndrome  PROCEDURE:  Procedure(s): Spinal cord stimulator placement (N/A)  SURGEON:  Surgeon(s) and Role:    * Melina Schools, MD - Primary  PHYSICIAN ASSISTANT:   ASSISTANTS: Carmen Mayo   ANESTHESIA:   general  EBL:  Total I/O In: 600 [I.V.:600] Out: 50 [Blood:50]  BLOOD ADMINISTERED:none  DRAINS: none   LOCAL MEDICATIONS USED:  MARCAINE     SPECIMEN:  No Specimen  DISPOSITION OF SPECIMEN:  N/A  COUNTS:  YES  TOURNIQUET:  * No tourniquets in log *  DICTATION: .Other Dictation: Dictation Number [id number]  PLAN OF CARE: Admit to inpatient   PATIENT DISPOSITION:  PACU - hemodynamically stable.

## 2016-09-11 NOTE — Transfer of Care (Signed)
Immediate Anesthesia Transfer of Care Note  Patient: [name]  Procedure(s) Performed: Procedure(s): Spinal cord stimulator placement (N/A)  Patient Location: PACU  Anesthesia Type:General  Level of Consciousness: awake, alert , oriented and patient cooperative  Airway & Oxygen Therapy: Patient Spontanous Breathing and Patient connected to nasal cannula oxygen  Post-op Assessment: Report given to RN and Post -op Vital signs reviewed and stable  Post vital signs: Reviewed and stable  Last Vitals:  Vitals:   09/11/16 0939  BP: (!) 142/69  Pulse: (!) 55  Resp: 18  Temp: 36.4 C    Last Pain:  Vitals:   09/11/16 0939  TempSrc: Oral         Complications: No apparent anesthesia complications

## 2016-09-12 ENCOUNTER — Encounter (HOSPITAL_COMMUNITY): Payer: Self-pay | Admitting: Orthopedic Surgery

## 2016-09-12 NOTE — Op Note (Signed)
[name], [name]                  ACCOUNT NO.:  [id number]  MEDICAL RECORD NO.:  [id number]  LOCATION:                                 FACILITY:  PHYSICIAN:  Chayla Shands D. Rolena Infante, M.D. DATE OF BIRTH:  1935-10-29  DATE OF PROCEDURE:  09/11/2016 DATE OF DISCHARGE:                              OPERATIVE REPORT   PREOPERATIVE DIAGNOSES:  Chronic pain syndrome; failed back syndrome.  POSTOPERATIVE DIAGNOSES:  Chronic pain syndrome; failed back syndrome.  OPERATIVE PROCEDURE:  Implantation of spinal cord stimulator.  COMPLICATIONS:  None.  IMPLANT USED:  St. Jude Tripole system with the non-rechargeable battery.  FIRST ASSISTANT:  [facility].  HISTORY:  This is a very pleasant 81 year old gentleman who has been having progressive debilitating back pain.  He had a successful trial of spinal cord stimulator placement and then presented to me for definitive implantation.  All appropriate risks, benefits, and alternatives were discussed with surgery and consent was obtained.  OPERATIVE NOTE:  The patient was brought to the operating room and placed supine on the operating room table.  After successful induction of general anesthesia and endotracheal intubation, TEDs and SCDs were applied and he was turned prone onto the Wilson frame.  All bony prominences were well padded and the back was prepped and draped in a standard fashion.  Time-out was taken confirming patient, procedure, and all other pertinent important data.  Once this was done, x-ray was used to identify the T12 vertebral body and then count up to the T10 pedicle. I confirmed with the St. Jude rep that based on the preoperative numbering that we were at the appropriate level.  Once this was done, I marked out the incision site and infiltrated with 0.25% Marcaine.  A midline incision was made.  Sharp dissection was carried out down to the deep fascia.  Deep fascia was sharply incised and I stripped the paraspinal muscles to  expose the T10 and T11 lamina.  Once this was done, I repeated the x-rays confirming once again the location of the T10 pedicle and the T10-11 interspace.  Once this was done, a double- action Leksell rongeur was used to remove the spinous process at T10 and a small curved curette was used to dissect underneath the lamina.  A 2 mm and 3 mm Kerrison punches were used to perform a laminotomy of T10, and resected the ligamentum flavum.  I dissected gently through the epidural fat to expose the dorsal surface of the thecal sac.  The trial implant was placed without difficulty and the permanent Tripole lead was obtained and passed to the appropriate level slightly to the right side, as this was his more symptomatic side.  Once I confirmed satisfactory position behind the T9 vertebral body in both the AP and lateral planes, I then secured the leads directly to the T11 spinous process using Ethibond suture.  I then made a left-sided gluteal incision and created the cavity for the battery and then used the submuscular passer to advance the leads from the thoracic wound to the gluteal wound.  They were then connected to the battery and locked in place with a torque wrench according to  manufacture's standards.  The battery was then secured into deep fascia with two #1 Vicryl sutures.  The battery was tested and was functioning without any difficulty.  All wounds were copiously irrigated with normal saline and then closed in a layered fashion.  Deep fascial layer was closed with interrupted #1 Vicryl suture, superficial with 2-0 Vicryl suture and a 3-0 Monocryl for the skin.  Steri-Strips and dry dressings were applied, and the patient was extubated and transferred to PACU without incident.  At the end of the case, all needle and sponge counts were correct.  There were no adverse intraoperative events.     Karema Tocci D. Rolena Infante, M.D.   ______________________________ Blake Divine. Rolena Infante,  M.D.    DDB/MEDQ  D:  09/11/2016  T:  09/12/2016  Job:  [id number]  cc:   [place]

## 2016-09-12 NOTE — Evaluation (Signed)
Occupational Therapy Evaluation Patient Details Name: [name] MRN: [id number] DOB: 05-Jun-1936 Today's Date: 09/12/2016    History of Present Illness 81 yo male s/p spinal cord stimulator   Clinical Impression   Patient evaluated by Occupational Therapy with no further acute OT needs identified. All education has been completed and the patient has no further questions. See below for any follow-up Occupational Therapy or equipment needs. OT to sign off. Thank you for referral.      Follow Up Recommendations  No OT follow up    Equipment Recommendations  None recommended by OT    Recommendations for Other Services       Precautions / Restrictions Precautions Precautions: Back Precaution Comments: handout provided and reviewed in detail with good recall      Mobility Bed Mobility Overal bed mobility: Independent                Transfers Overall transfer level: Independent                    Balance                                            ADL Overall ADL's : Independent                         Back handout provided and reviewed adls in detail. Pt educated on: clothing between brace, never sleep in brace, set an alarm at night for medication, avoid sitting for long periods of time, correct bed positioning for sleeping, correct sequence for bed mobility, avoiding lifting more than 5 pounds and never wash directly over incision. All education is complete and patient indicates understanding. Educated on sheet on bed surface to help with tranfers with spouse (A)                        Vision Baseline Vision/History: Wears glasses Wears Glasses: At all times       Perception     Praxis      Pertinent Vitals/Pain Pain Assessment: Faces Pain Score: 2  Faces Pain Scale: Hurts a little bit Pain Location: back Pain Descriptors / Indicators: Sore Pain Intervention(s): Premedicated before session;Monitored during  session     Hand Dominance Right   Extremity/Trunk Assessment Upper Extremity Assessment Upper Extremity Assessment: Overall WFL for tasks assessed   Lower Extremity Assessment Lower Extremity Assessment: Overall WFL for tasks assessed   Cervical / Trunk Assessment Cervical / Trunk Assessment: Other exceptions (multiple surg and currently with stimulator) Cervical / Trunk Exceptions:     Communication Communication Communication: No difficulties   Cognition Arousal/Alertness: Awake/alert Behavior During Therapy: WFL for tasks assessed/performed Overall Cognitive Status: Within Functional Limits for tasks assessed                     General Comments       Exercises       Shoulder Instructions      Home Living Family/patient expects to be discharged to:: Private residence Living Arrangements: Other relatives Available Help at Discharge: Family Type of Home: House Home Access: Stairs to enter Technical brewer of Steps: 1 Entrance Stairs-Rails: None Home Layout: One level     Bathroom Shower/Tub: Occupational psychologist: Handicapped height     Home  Equipment: Gilford Rile - 2 wheels;Bedside commode;Walker - standard;Cane - single point;Shower seat;Grab bars - toilet;Grab bars - tub/shower;Hand held shower head;Wheelchair - Scientist, physiological: Reacher Additional Comments: pt wife passed last night and pt unaware. Family to take pt home to inform. pt has family that lives next door that will (A) with care      Prior Functioning/Environment Level of Independence: Independent                 OT Problem List:        OT Treatment/Interventions:      OT Goals(Current goals can be found in the care plan section)    OT Frequency:     Barriers to D/C:            Co-evaluation              End of Session Nurse Communication: Mobility status;Precautions  Activity Tolerance: Patient tolerated treatment  well Patient left: in bed;with call bell/phone within reach;with family/visitor present  OT Visit Diagnosis: Muscle weakness (generalized) (M62.81)                ADL either performed or assessed with clinical judgement  Time: [id number] OT Time Calculation (min): 12 min Charges:  OT General Charges $OT Visit: 1 Procedure OT Evaluation $OT Eval Moderate Complexity: 1 Procedure G-Codes:      Jeri Modena   OTR/L PagerIP:[phone] Office: [phone] .   Parke Poisson B 09/12/2016, 9:21 AM

## 2016-09-12 NOTE — Progress Notes (Signed)
Patient alert and oriented, mae's well, voiding adequate amount of urine, swallowing without difficulty, no c/o pain. Patient discharged home with family. Script and discharged instructions given to patient. Patient and family stated understanding of d/c instructions given and has an appointment with Dr Brooks  

## 2016-09-12 NOTE — Progress Notes (Signed)
PT Cancellation Note  Patient Details Name: [name] MRN: [id number] DOB: 10/12/1935   Cancelled Treatment:    Reason Eval/Treat Not Completed: PT screened, no needs identified, will sign off. Discussed pt case with OT who states that pt does not require PT eval this date. Observed pt ambulating in hall with OT without assistance and without AD. If needs change, please reconsult.    Thelma Comp 09/12/2016, 8:28 AM   Rolinda Roan, PT, DPT Acute Rehabilitation Services Pager: [phone]

## 2016-09-12 NOTE — Progress Notes (Signed)
    Subjective: Procedure(s) (LRB): Spinal cord stimulator placement (N/A) 1 Day Post-Op  Patient reports pain as 1 on 0-10 scale.  Reports decreased leg pain reports incisional back pain   Positive void Negative bowel movement Positive flatus Negative chest pain or shortness of breath  Objective: Vital signs in last 24 hours: Temp:  [97.3 F (36.3 C)-97.8 F (36.6 C)] 97.4 F (36.3 C) (03/02 0315) Pulse Rate:  [49-63] 56 (03/02 0315) Resp:  [10-18] 18 (03/02 0315) BP: (120-142)/(54-78) 123/54 (03/02 0315) SpO2:  [94 %-100 %] 97 % (03/02 0315) Weight:  [88 kg (194 lb)] 88 kg (194 lb) (03/01 0927)  Intake/Output from previous day: 03/01 0701 - 03/02 0700 In: 600 [I.V.:600] Out: 50 [Blood:50]  Labs: No results for input(s): WBC, RBC, HCT, PLT in the last 72 hours. No results for input(s): NA, K, CL, CO2, BUN, CREATININE, GLUCOSE, CALCIUM in the last 72 hours. No results for input(s): LABPT, INR in the last 72 hours.  Physical Exam: Neurologically intact ABD soft Intact pulses distally Incision: dressing C/D/I Compartment soft  Assessment/Plan: Patient stable  xrays n/a Continue mobilization with physical therapy Continue care  Advance diet Up with therapy  Doing well Plan on d/c to home today  Melina Schools, MD [place] [phone]

## 2016-09-16 NOTE — Discharge Summary (Signed)
Physician Discharge Summary  Patient ID: [name] MRN: [id number] DOB/AGE: April 28, 1936 81 y.o.  Admit date: 09/11/2016 Discharge date: 09/16/2016  Admission Diagnoses:  Chronic Pain Syndrome/Failed Back Syndrome  Discharge Diagnoses:  Active Problems:   Chronic pain   Past Medical History:  Diagnosis Date  . Acute bronchitis per pt no fever--  cough since discharge from hospital 07-02-2015   per pcp note 07-05-2015  mild to moderate bronchitis vs pna  . Allergic rhinitis   . Bladder cancer ([place])   . BPH (benign prostatic hyperplasia)   . Bradycardia   . CAD (coronary artery disease)   . CHF (congestive heart failure) ([place])   . Chronic low back pain   . DDD (degenerative disc disease), lumbosacral   . Depression   . Diverticulosis of colon (without mention of hemorrhage)   . Dyspnea   . Essential tremor   . Fatty liver disease, nonalcoholic   . GERD (gastroesophageal reflux disease)   . Glaucoma   . History of bladder cancer    2004--  TCC low-grade , non-invasive  . History of colon polyps   . History of exercise stress test    02-05-2007-- ETT (Clinically positive, electrically equivocal, submaximal ETT,  appropriate bp response to exercise  . History of hiatal hernia   . History of kidney stones   . History of peptic ulcer   . HLD (hyperlipidemia)   . HTN (hypertension)   . Nocturia   . Productive cough   . Rheumatoid arthritis(714.0)   . Right renal artery stenosis ([facility])    per cath 03-14-2009 --- 40-50%  . RLS (restless legs syndrome)   . Vertigo, intermittent   . Weak urinary stream   . Wears dentures    upper  . Wears glasses   . Wears hearing aid    bilateral  . Wears partial dentures    lower    Surgeries: Procedure(s): Spinal cord stimulator placement on 09/11/2016   Consultants (if any):   Discharged Condition: Improved  Hospital Course: [name] is an 81 y.o. male who was admitted 09/11/2016 with a diagnosis of Chronic Pain Syndrome/Failed  Back Syndrome and went to the operating room on 09/11/2016 and underwent the above named procedures.  Post op day 1 pt reports low incisional pain controlled on oral medications Pt voiding w/o difficulty.  Pt cleared by OT for DC.  He was given perioperative antibiotics:  Anti-infectives    Start     Dose/Rate Route Frequency Ordered Stop   09/11/16 1930  ceFAZolin (ANCEF) IVPB 2g/100 mL premix     2 g 200 mL/hr over 30 Minutes Intravenous Every 8 hours 09/11/16 1600 09/12/16 0314   09/11/16 0948  ceFAZolin (ANCEF) IVPB 2g/100 mL premix     2 g 200 mL/hr over 30 Minutes Intravenous 30 min pre-op 09/11/16 0948 09/11/16 1139    .  He was given sequential compression devices, early ambulation, and TED for DVT prophylaxis.  He benefited maximally from the hospital stay and there were no complications.    Recent vital signs:  Vitals:   09/12/16 0315 09/12/16 0808  BP: (!) 123/54 127/90  Pulse: (!) 56 69  Resp: 18 18  Temp: 97.4 F (36.3 C) 98.6 F (37 C)    Recent laboratory studies:  Lab Results  Component Value Date   HGB 15.3 09/04/2016   HGB 15.1 06/04/2016   HGB 12.2 (L) 05/24/2016   Lab Results  Component Value Date   WBC  5.6 09/04/2016   PLT 192 09/04/2016   Lab Results  Component Value Date   INR 1.10 07/01/2015   Lab Results  Component Value Date   NA 131 (L) 09/04/2016   K 3.9 09/04/2016   CL 94 (L) 09/04/2016   CO2 26 09/04/2016   BUN 5 (L) 09/04/2016   CREATININE 0.78 09/04/2016   GLUCOSE 94 09/04/2016    Discharge Medications:   Allergies as of 09/12/2016      Reactions   Morphine And Related Other (See Comments)   Migraine    Hydrochlorothiazide Other (See Comments)   REACTION: hyponatremia with daily use   Tape Itching   Patient  Is ok to use paper tape   Latex Rash   Lisinopril Cough   Sulfa Antibiotics Rash      Medication List    TAKE these medications   acetaminophen 650 MG CR tablet Commonly known as:  TYLENOL Take 1,300 mg by  mouth at bedtime as needed for pain.   albuterol 108 (90 Base) MCG/ACT inhaler Commonly known as:  PROVENTIL HFA;VENTOLIN HFA Inhale 2 puffs into the lungs every 6 (six) hours as needed for wheezing or shortness of breath.   amLODipine 5 MG tablet Commonly known as:  NORVASC Take 1 tablet (5 mg total) by mouth daily. What changed:  when to take this  reasons to take this   brimonidine 0.15 % ophthalmic solution Commonly known as:  ALPHAGAN Place 1 drop into both eyes 2 (two) times daily.   citalopram 10 MG tablet Commonly known as:  CELEXA TAKE 1 TABLET EVERY MORNING   clotrimazole 1 % cream Commonly known as:  LOTRIMIN Apply 1 application topically 2 (two) times daily as needed (FOR RASH). Prescribed for rash in groin   EMERGEN-C FIVE PO Take 1 packet by mouth 2 (two) times daily.   fluticasone 50 MCG/ACT nasal spray Commonly known as:  FLONASE USE 2 SPRAYS IN EACH NOSTRIL EVERY DAY AS NEEDED  FOR  RHINITIS/ALLERGIES   gabapentin 300 MG capsule Commonly known as:  NEURONTIN TAKE 1 TO 2 CAPSULES FOUR TIMES DAILY  AFTER MEALS AND AT BEDTIME. What changed:  See the new instructions.   hydrochlorothiazide 25 MG tablet Commonly known as:  HYDRODIURIL Take 1 tablet (25 mg total) by mouth daily.   hydroxypropyl methylcellulose / hypromellose 2.5 % ophthalmic solution Commonly known as:  ISOPTO TEARS / GONIOVISC Place 1 drop into both eyes 2 (two) times daily.   influenza vac recom quadrivalent 0.5 ML injection Commonly known as:  FLUBLOK Inject 0.5 mLs into the muscle once.   losartan 50 MG tablet Commonly known as:  COZAAR Take 50 mg by mouth daily as needed (for blood pressure reading over 160). Reported on 09/16/2015   meclizine 25 MG tablet Commonly known as:  ANTIVERT TAKE 1 TABLET TWO TIMES DAILY AS NEEDED FOR DIZZINESS   methocarbamol 500 MG tablet Commonly known as:  ROBAXIN Take 1 tablet (500 mg total) by mouth 3 (three) times daily as needed for muscle  spasms.   nitroGLYCERIN 0.4 MG SL tablet Commonly known as:  NITROSTAT Place 1 tablet (0.4 mg total) under the tongue every 5 (five) minutes as needed. Chest pain What changed:  reasons to take this  additional instructions   ondansetron 4 MG tablet Commonly known as:  ZOFRAN Take 1 tablet (4 mg total) by mouth every 8 (eight) hours as needed for nausea or vomiting.   oxyCODONE-acetaminophen 10-325 MG tablet Commonly known as:  PERCOCET Take 1 tablet by mouth every 4 (four) hours as needed for pain. What changed:  when to take this   pantoprazole 40 MG tablet Commonly known as:  PROTONIX Take 40 mg by mouth 2 (two) times daily.   Potassium Chloride ER 20 MEQ Tbcr Take 20 mEq by mouth daily. What changed:  how much to take   primidone 250 MG tablet Commonly known as:  MYSOLINE TAKE 1/2 TABLET THREE TIMES DAILY   simvastatin 40 MG tablet Commonly known as:  ZOCOR TAKE 1 AND 1/2 TABLETS AT BEDTIME   tamsulosin 0.4 MG Caps capsule Commonly known as:  FLOMAX Take 1 capsule (0.4 mg total) by mouth daily. What changed:  how much to take  when to take this   timolol 0.5 % ophthalmic solution Commonly known as:  TIMOPTIC Place 1 drop into both eyes 2 (two) times daily.   Travoprost (BAK Free) 0.004 % Soln ophthalmic solution Commonly known as:  TRAVATAN Z Place 1 drop into both eyes at bedtime.   triamcinolone 0.025 % cream Commonly known as:  KENALOG Apply 1 application topically 2 (two) times daily as needed (rash).       Diagnostic Studies: Dg Thoracic Spine 2 View  Result Date: 09/11/2016 CLINICAL DATA:  Spinal cord stimulator placement. EXAM: DG C-ARM 61-120 MIN; THORACIC SPINE 2 VIEWS COMPARISON:  None. FLUOROSCOPY TIME:  C-arm fluoroscopic images were obtained intraoperatively and submitted for post operative interpretation. Please see the performing provider's procedural report for the fluoroscopy time utilized. FINDINGS: Frontal and lateral spot  intraoperative fluoroscopic images of the lower thoracic spine are provided. A spinal cord stimulator projects over the midline dorsal aspect of the lower thoracic spinal canal, with precise level localization precluded by the limited field of view. IMPRESSION: Intraoperative images during spinal cord stimulator placement. Electronically Signed   By: Logan Bores M.D.   On: 09/11/2016 13:23   Dg C-arm 1-60 Min  Result Date: 09/11/2016 CLINICAL DATA:  Spinal cord stimulator placement. EXAM: DG C-ARM 61-120 MIN; THORACIC SPINE 2 VIEWS COMPARISON:  None. FLUOROSCOPY TIME:  C-arm fluoroscopic images were obtained intraoperatively and submitted for post operative interpretation. Please see the performing provider's procedural report for the fluoroscopy time utilized. FINDINGS: Frontal and lateral spot intraoperative fluoroscopic images of the lower thoracic spine are provided. A spinal cord stimulator projects over the midline dorsal aspect of the lower thoracic spinal canal, with precise level localization precluded by the limited field of view. IMPRESSION: Intraoperative images during spinal cord stimulator placement. Electronically Signed   By: Logan Bores M.D.   On: 09/11/2016 13:23    Disposition: 01-Home or Self Care Pt will present to clinic in 2 weeks Post op medications provided  Discharge Instructions    Incentive spirometry RT    Complete by:  As directed       Follow-up Information    BROOKS,DAHARI D, MD In 2 weeks.   Specialty:  Orthopedic Surgery Why:  If symptoms worsen, For suture removal Contact information: [address] [place] [address] [id number]            Signed: Valinda Hoar 09/16/2016, 8:57 PM

## 2016-09-17 DIAGNOSIS — H4720 Unspecified optic atrophy: Secondary | ICD-10-CM | POA: Diagnosis not present

## 2016-09-17 DIAGNOSIS — H401133 Primary open-angle glaucoma, bilateral, severe stage: Secondary | ICD-10-CM | POA: Diagnosis not present

## 2016-09-17 DIAGNOSIS — H5340 Unspecified visual field defects: Secondary | ICD-10-CM | POA: Diagnosis not present

## 2016-09-17 DIAGNOSIS — Z961 Presence of intraocular lens: Secondary | ICD-10-CM | POA: Diagnosis not present

## 2016-09-24 ENCOUNTER — Ambulatory Visit (INDEPENDENT_AMBULATORY_CARE_PROVIDER_SITE_OTHER): Payer: Medicare Other | Admitting: Podiatry

## 2016-09-24 ENCOUNTER — Encounter: Payer: Self-pay | Admitting: Podiatry

## 2016-09-24 DIAGNOSIS — M79676 Pain in unspecified toe(s): Secondary | ICD-10-CM

## 2016-09-24 DIAGNOSIS — M205X1 Other deformities of toe(s) (acquired), right foot: Secondary | ICD-10-CM | POA: Diagnosis not present

## 2016-09-24 DIAGNOSIS — B351 Tinea unguium: Secondary | ICD-10-CM

## 2016-09-24 DIAGNOSIS — M779 Enthesopathy, unspecified: Secondary | ICD-10-CM

## 2016-09-24 MED ORDER — TRIAMCINOLONE ACETONIDE 10 MG/ML IJ SUSP
10.0000 mg | Freq: Once | INTRAMUSCULAR | Status: AC
  Administered 2016-09-24: 10 mg

## 2016-09-26 NOTE — Progress Notes (Signed)
Subjective:     Patient ID: [name], male   DOB: Sep 21, 1935, 81 y.o.   MRN: [id number]  HPI patient presents with painful nailbeds 1-5 both feet   Review of Systems     Objective:   Physical Exam Neurovascular status unchanged with thick yellow brittle nailbeds 1-5 both feet    Assessment:     Mycotic nail infection 1-5 both feet    Plan:     Debris painful nailbeds 1-5 both feet with no iatrogenic bleeding noted

## 2016-09-27 ENCOUNTER — Other Ambulatory Visit: Payer: Self-pay | Admitting: Internal Medicine

## 2016-09-27 DIAGNOSIS — Z4789 Encounter for other orthopedic aftercare: Secondary | ICD-10-CM | POA: Diagnosis not present

## 2016-09-27 DIAGNOSIS — M5442 Lumbago with sciatica, left side: Secondary | ICD-10-CM | POA: Diagnosis not present

## 2016-09-27 DIAGNOSIS — M5136 Other intervertebral disc degeneration, lumbar region: Secondary | ICD-10-CM | POA: Diagnosis not present

## 2016-10-11 ENCOUNTER — Other Ambulatory Visit: Payer: Self-pay | Admitting: Internal Medicine

## 2016-10-24 ENCOUNTER — Other Ambulatory Visit: Payer: Self-pay

## 2016-10-24 MED ORDER — LOSARTAN POTASSIUM 50 MG PO TABS: 50.0000 mg | ORAL_TABLET | Freq: Every day | ORAL | 0 refills | Status: DC

## 2016-10-24 MED ORDER — POTASSIUM CHLORIDE ER 10 MEQ PO TBCR: 10.0000 meq | EXTENDED_RELEASE_TABLET | Freq: Every day | ORAL | 0 refills | Status: DC

## 2016-10-24 NOTE — Telephone Encounter (Signed)
Pt is requesting a refill of losartan and the potassium. There is a discrepancy on the strength of the potassium and pt states that he had not take the losartan in a long time and he is taking the 10 meq of K+. Pt informed me that his wife had passed away and his BP is creeping up. Pt has an appt with you on 11/12/2016. Please advise on the losartan and the potassium strength. Pt pharmacy is [facility].

## 2016-10-24 NOTE — Telephone Encounter (Signed)
Pt informed rx was sent.  

## 2016-10-24 NOTE — Telephone Encounter (Signed)
Refills done  Further refills to be done at [facility]

## 2016-10-27 DIAGNOSIS — Z4789 Encounter for other orthopedic aftercare: Secondary | ICD-10-CM | POA: Diagnosis not present

## 2016-11-12 ENCOUNTER — Other Ambulatory Visit (INDEPENDENT_AMBULATORY_CARE_PROVIDER_SITE_OTHER): Payer: Medicare Other

## 2016-11-12 ENCOUNTER — Ambulatory Visit (INDEPENDENT_AMBULATORY_CARE_PROVIDER_SITE_OTHER): Payer: Medicare Other | Admitting: Internal Medicine

## 2016-11-12 ENCOUNTER — Encounter: Payer: Self-pay | Admitting: Internal Medicine

## 2016-11-12 VITALS — BP 140/70 | HR 69 | Ht 68.0 in | Wt 193.0 lb

## 2016-11-12 DIAGNOSIS — I10 Essential (primary) hypertension: Secondary | ICD-10-CM | POA: Diagnosis not present

## 2016-11-12 DIAGNOSIS — R7302 Impaired glucose tolerance (oral): Secondary | ICD-10-CM

## 2016-11-12 DIAGNOSIS — N320 Bladder-neck obstruction: Secondary | ICD-10-CM

## 2016-11-12 DIAGNOSIS — Z8551 Personal history of malignant neoplasm of bladder: Secondary | ICD-10-CM | POA: Diagnosis not present

## 2016-11-12 DIAGNOSIS — E785 Hyperlipidemia, unspecified: Secondary | ICD-10-CM

## 2016-11-12 LAB — URINALYSIS, ROUTINE W REFLEX MICROSCOPIC
Bilirubin Urine: NEGATIVE
Hgb urine dipstick: NEGATIVE
Ketones, ur: NEGATIVE
Leukocytes, UA: NEGATIVE
Nitrite: NEGATIVE
RBC / HPF: NONE SEEN (ref 0–?)
SPECIFIC GRAVITY, URINE: 1.015 (ref 1.000–1.030)
Total Protein, Urine: NEGATIVE
URINE GLUCOSE: NEGATIVE
Urobilinogen, UA: 0.2 (ref 0.0–1.0)
pH: 7 (ref 5.0–8.0)

## 2016-11-12 LAB — TSH: TSH: 1.8 u[IU]/mL (ref 0.35–4.50)

## 2016-11-12 LAB — BASIC METABOLIC PANEL
BUN: 8 mg/dL (ref 6–23)
CHLORIDE: 88 meq/L — AB (ref 96–112)
CO2: 29 mEq/L (ref 19–32)
Calcium: 9.5 mg/dL (ref 8.4–10.5)
Creatinine, Ser: 0.8 mg/dL (ref 0.40–1.50)
GFR: 99.03 mL/min (ref >60.00)
Glucose, Bld: 108 mg/dL — ABNORMAL HIGH (ref 70–99)
Potassium: 3.9 mEq/L (ref 3.5–5.1)
Sodium: 123 mEq/L — ABNORMAL LOW (ref 135–145)

## 2016-11-12 LAB — HEPATIC FUNCTION PANEL
ALT: 22 U/L (ref 0–53)
AST: 25 U/L (ref 0–37)
Albumin: 4.9 g/dL (ref 3.5–5.2)
Alkaline Phosphatase: 78 U/L (ref 39–117)
BILIRUBIN DIRECT: 0.2 mg/dL (ref 0.0–0.3)
BILIRUBIN TOTAL: 0.6 mg/dL (ref 0.2–1.2)
Total Protein: 7.2 g/dL (ref 6.0–8.3)

## 2016-11-12 LAB — CBC WITH DIFFERENTIAL/PLATELET
BASOS PCT: 0.5 % (ref 0.0–3.0)
Basophils Absolute: 0 10*3/uL (ref 0.0–0.1)
EOS ABS: 0.1 10*3/uL (ref 0.0–0.7)
Eosinophils Relative: 2.4 % (ref 0.0–5.0)
HEMATOCRIT: 44.6 % (ref 39.0–52.0)
Hemoglobin: 15.7 g/dL (ref 13.0–17.0)
LYMPHS PCT: 24.6 % (ref 12.0–46.0)
Lymphs Abs: 1.4 10*3/uL (ref 0.7–4.0)
MCHC: 35.1 g/dL (ref 30.0–36.0)
MCV: 87.9 fl (ref 78.0–100.0)
MONO ABS: 0.8 10*3/uL (ref 0.1–1.0)
Monocytes Relative: 13.5 % — ABNORMAL HIGH (ref 3.0–12.0)
NEUTROS ABS: 3.3 10*3/uL (ref 1.4–7.7)
Neutrophils Relative %: 59 % (ref 43.0–77.0)
PLATELETS: 281 10*3/uL (ref 150.0–400.0)
RBC: 5.08 Mil/uL (ref 4.22–5.81)
RDW: 13.3 % (ref 11.5–15.5)
WBC: 5.6 10*3/uL (ref 4.0–10.5)

## 2016-11-12 LAB — LIPID PANEL
CHOL/HDL RATIO: 4
Cholesterol: 143 mg/dL (ref 0–200)
HDL: 39.6 mg/dL (ref >39.00)
LDL Cholesterol: 70 mg/dL (ref 0–99)
NONHDL: 102.91
Triglycerides: 164 mg/dL — ABNORMAL HIGH (ref 0.0–149.0)
VLDL: 32.8 mg/dL (ref 0.0–40.0)

## 2016-11-12 LAB — PSA: PSA: 0.53 ng/mL (ref 0.10–4.00)

## 2016-11-12 LAB — HEMOGLOBIN A1C: Hgb A1c MFr Bld: 5.8 % (ref 4.6–6.5)

## 2016-11-12 MED ORDER — MECLIZINE HCL 25 MG PO TABS: ORAL_TABLET | ORAL | 0 refills | Status: DC

## 2016-11-12 MED ORDER — MECLIZINE HCL 25 MG PO TABS: ORAL_TABLET | ORAL | 3 refills | Status: DC

## 2016-11-12 NOTE — Patient Instructions (Signed)

## 2016-11-12 NOTE — Progress Notes (Signed)
Pre visit review using our clinic review tool, if applicable. No additional management support is needed unless otherwise documented below in the visit note. 

## 2016-11-12 NOTE — Progress Notes (Signed)
Subjective:    Patient ID: [name], male    DOB: [birth year], 81 y.o.   MRN: [id number]  HPI  Here for yearly f/u;  Overall doing ok;  Pt denies Chest pain, worsening SOB, DOE, wheezing, orthopnea, PND, worsening LE edema, palpitations, dizziness or syncope.  Pt denies neurological change such as new headache, facial or extremity weakness.  Pt denies polydipsia, polyuria, or low sugar symptoms. Pt states overall good compliance with treatment and medications, good tolerability, and has been trying to follow appropriate diet.  Pt denies worsening depressive symptoms, suicidal ideation or panic. No fever, night sweats, wt loss, loss of appetite, or other constitutional symptoms.  Pt states good ability with ADL's, has low fall risk, home safety reviewed and adequate, no other significant changes in hearing or vision, and only occasionally active with exercise. Wife died recently and he is grieving, not interested in increased activity.  Chronic back pain has improved with  Implanted TENS unit working well, no longer needs surgical f/u, to f/u with Dr Nelva Bush, has been also tx with muscle relaxers after pain med cuases GI upset Past Medical History:  Diagnosis Date  . Acute bronchitis per pt no fever--  cough since discharge from hospital 07-02-2015   per pcp note 07-05-2015  mild to moderate bronchitis vs pna  . Allergic rhinitis   . Bladder cancer ([place])   . BPH (benign prostatic hyperplasia)   . Bradycardia   . CAD (coronary artery disease)   . CHF (congestive heart failure) ([place])   . Chronic low back pain   . DDD (degenerative disc disease), lumbosacral   . Depression   . Diverticulosis of colon (without mention of hemorrhage)   . Dyspnea   . Essential tremor   . Fatty liver disease, nonalcoholic   . GERD (gastroesophageal reflux disease)   . Glaucoma   . History of bladder cancer    2004--  TCC low-grade , non-invasive  . History of colon polyps   . History of exercise stress test    02-05-2007-- ETT (Clinically positive, electrically equivocal, submaximal ETT,  appropriate bp response to exercise  . History of hiatal hernia   . History of kidney stones   . History of peptic ulcer   . HLD (hyperlipidemia)   . HTN (hypertension)   . Nocturia   . Productive cough   . Rheumatoid arthritis(714.0)   . Right renal artery stenosis ([facility])    per cath 03-14-2009 --- 40-50%  . RLS (restless legs syndrome)   . Vertigo, intermittent   . Weak urinary stream   . Wears dentures    upper  . Wears glasses   . Wears hearing aid    bilateral  . Wears partial dentures    lower   Past Surgical History:  Procedure Laterality Date  . CARDIAC CATHETERIZATION  02-12-2007  dr gregg taylor   Non-obstructive CAD,  20% pLCX,  20% LAD, ef 65%  . CARDIAC CATHETERIZATION  03-14-2009  dr Johnsie Cancel   pLAD and mid diaginal 20-30% multiple lesions,  OM1 20%, right renal ostial 40-50%,  ef 60%  . CATARACT EXTRACTION W/ INTRAOCULAR LENS  IMPLANT, BILATERAL  2016  . CYSTOSCOPY WITH BIOPSY N/A 07/11/2015   Procedure: CYSTOSCOPY WITH COLD CUP RESECTION AND FULGERATION;  Surgeon: Rana Snare, MD;  [facility];  Service: Urology;  Laterality: N/A;  . HIP ARTHROSCOPY W/ LABRAL DEBRIDEMENT Left 06-29-2000   and chondraplasty  . LUMBAR [place] SURGERY  07-16-2005  .  LUMBAR LAMINECTOMY/DECOMPRESSION MICRODISCECTOMY  07/09/2011   Procedure: LUMBAR LAMINECTOMY/DECOMPRESSION MICRODISCECTOMY;  Surgeon: Johnn Hai;  Location: [facility];  Service: Orthopedics;  Laterality: Left;  Decompression L5 - S1 on the Left and repair of dura  . LUMBAR LAMINECTOMY/DECOMPRESSION MICRODISCECTOMY N/A 07/27/2013   Procedure: DECOMPRESSION L4 - L5 WITH EXCISION OF SYNOVIAL CYST AND, LATERAL MASS FUSION 1 LEVEL;  Surgeon: Johnn Hai, MD;  Location: [facility];  Service: Orthopedics;  Laterality: N/A;  . LUMBAR LAMINECTOMY/DECOMPRESSION MICRODISCECTOMY Left 07/06/2014   Procedure:  MICRO LUMBAR  DECOMPRESSION L5-S1 ON LEFT, L4-5 REDO;  Surgeon: Johnn Hai, MD;  Location: [facility];  Service: Orthopedics;  Laterality: Left;  . SPINAL CORD STIMULATOR INSERTION N/A 09/11/2016   Procedure: Spinal cord stimulator placement;  Surgeon: Melina Schools, MD;  Location: [facility];  Service: Orthopedics;  Laterality: N/A;  . TONSILLECTOMY AND ADENOIDECTOMY    . TRANSTHORACIC ECHOCARDIOGRAM  09-20-2010   normal LVF, ef 55-65%/  mild MR, TR and PR/  mild LAE  . TRANSURETHRAL RESECTION OF BLADDER TUMOR  09-21-2002    reports that he quit smoking about 34 years ago. His smoking use included Cigarettes. He has a 40.00 pack-year smoking history. He has never used smokeless tobacco. He reports that he does not drink alcohol or use drugs. family history includes Cancer in his brother, father, and sister; Diabetes in his sister; Heart attack in his mother. Allergies  Allergen Reactions  . Morphine And Related Other (See Comments)    Migraine   . Hydrochlorothiazide Other (See Comments)    REACTION: hyponatremia with daily use  . Tape Itching    Patient  Is ok to use paper tape  . Latex Rash  . Lisinopril Cough  . Sulfa Antibiotics Rash   Current Outpatient Prescriptions on File Prior to Visit  Medication Sig Dispense Refill  . acetaminophen (TYLENOL) 650 MG CR tablet Take 1,300 mg by mouth at bedtime as needed for pain.    Marland Kitchen albuterol (PROVENTIL HFA;VENTOLIN HFA) 108 (90 Base) MCG/ACT inhaler Inhale 2 puffs into the lungs every 6 (six) hours as needed for wheezing or shortness of breath. 1 Inhaler 11  . amLODipine (NORVASC) 5 MG tablet Take 1 tablet (5 mg total) by mouth daily. (Patient taking differently: Take 5 mg by mouth daily as needed (fro BP reading over 160). ) 90 tablet 1  . brimonidine (ALPHAGAN) 0.15 % ophthalmic solution Place 1 drop into both eyes 2 (two) times daily.    . citalopram (CELEXA) 10 MG tablet TAKE 1 TABLET EVERY MORNING 90 tablet 1  . clotrimazole (LOTRIMIN) 1 % cream Apply 1  application topically 2 (two) times daily as needed (FOR RASH). Prescribed for rash in groin    . fluticasone (FLONASE) 50 MCG/ACT nasal spray USE 2 SPRAYS IN EACH NOSTRIL EVERY DAY AS NEEDED  FOR  RHINITIS/ALLERGIES 48 g 3  . gabapentin (NEURONTIN) 300 MG capsule TAKE 1 TO 2 CAPSULES FOUR TIMES DAILY  AFTER MEALS AND AT BEDTIME. (Patient taking differently: TAKE 2 CAPSULES FOUR TIMES DAILY  AFTER MEALS AND AT BEDTIME.) 720 capsule 3  . hydrochlorothiazide (HYDRODIURIL) 25 MG tablet Take 1 tablet (25 mg total) by mouth daily. 90 tablet 3  . hydroxypropyl methylcellulose / hypromellose (ISOPTO TEARS / GONIOVISC) 2.5 % ophthalmic solution Place 1 drop into both eyes 2 (two) times daily.    . influenza vac recom quadrivalent (FLUBLOK) 0.5 ML injection Inject 0.5 mLs into the muscle once.    Marland Kitchen losartan (COZAAR) 50  MG tablet Take 1 tablet (50 mg total) by mouth daily. Reported on 09/16/2015 90 tablet 0  . methocarbamol (ROBAXIN) 500 MG tablet Take 1 tablet (500 mg total) by mouth 3 (three) times daily as needed for muscle spasms. 21 tablet 0  . Multiple Vitamins-Minerals (EMERGEN-C FIVE PO) Take 1 packet by mouth 2 (two) times daily.    . nitroGLYCERIN (NITROSTAT) 0.4 MG SL tablet Place 1 tablet (0.4 mg total) under the tongue every 5 (five) minutes as needed. Chest pain (Patient taking differently: Place 0.4 mg under the tongue every 5 (five) minutes as needed for chest pain. Chest pain  ) 60 tablet 5  . ondansetron (ZOFRAN) 4 MG tablet Take 1 tablet (4 mg total) by mouth every 8 (eight) hours as needed for nausea or vomiting. 20 tablet 0  . oxyCODONE-acetaminophen (PERCOCET) 10-325 MG tablet Take 1 tablet by mouth every 4 (four) hours as needed for pain. 42 tablet 0  . pantoprazole (PROTONIX) 40 MG tablet TAKE 1 TABLET TWICE DAILY 180 tablet 3  . potassium chloride (K-DUR) 10 MEQ tablet Take 1 tablet (10 mEq total) by mouth daily. 90 tablet 0  . primidone (MYSOLINE) 250 MG tablet TAKE 1/2 TABLET THREE  TIMES DAILY 135 tablet 1  . simvastatin (ZOCOR) 40 MG tablet TAKE 1 AND 1/2 TABLETS AT BEDTIME 135 tablet 1  . tamsulosin (FLOMAX) 0.4 MG CAPS capsule Take 1 capsule (0.4 mg total) by mouth daily. (Patient taking differently: Take 0.8 mg by mouth at bedtime. ) 90 capsule 3  . timolol (TIMOPTIC) 0.5 % ophthalmic solution Place 1 drop into both eyes 2 (two) times daily.   1  . Travoprost, BAK Free, (TRAVATAN Z) 0.004 % SOLN ophthalmic solution Place 1 drop into both eyes at bedtime. 5 mL 5  . triamcinolone (KENALOG) 0.025 % cream Apply 1 application topically 2 (two) times daily as needed (rash).      No current facility-administered medications on file prior to visit.    Review of Systems  Constitutional: Negative for other unusual diaphoresis, sweats, appetite or weight changes HENT: Negative for other worsening hearing loss, ear pain, facial swelling, mouth sores or neck stiffness.   Eyes: Negative for other worsening pain, redness or other visual disturbance.  Respiratory: Negative for other stridor or swelling Cardiovascular: Negative for other palpitations or other chest pain  Gastrointestinal: Negative for worsening diarrhea or loose stools, blood in stool, distention or other pain Genitourinary: Negative for hematuria, flank pain or other change in urine volume.  Musculoskeletal: Negative for myalgias or other joint swelling.  Skin: Negative for other color change, or other wound or worsening drainage.  Neurological: Negative for other syncope or numbness. Hematological: Negative for other adenopathy or swelling Psychiatric/Behavioral: Negative for hallucinations, other worsening agitation, SI, self-injury, or new decreased concentration All other system neg  Per pt    Objective:   Physical Exam BP 140/70   Pulse 69   Ht 5\' 8"  (1.727 m)   Wt 193 lb (87.5 kg)   SpO2 98%   BMI 29.35 kg/m  VS noted,  Not ill appearing  Constitutional: Pt is oriented to person, place, and time.  Appears well-developed and well-nourished, in no significant distress and comfortable Head: Normocephalic and atraumatic  Eyes: Conjunctivae and EOM are normal. Pupils are equal, round, and reactive to light Right Ear: External ear normal without discharge Left Ear: External ear normal without discharge Nose: Nose without discharge or deformity Mouth/Throat: Oropharynx is without other ulcerations and moist  Neck: Normal range of motion. Neck supple. No JVD present. No tracheal deviation present or significant neck LA or mass Cardiovascular: Normal rate, regular rhythm, normal heart sounds and intact distal pulses.   Pulmonary/Chest: WOB normal and breath sounds without rales or wheezing  Abdominal: Soft. Bowel sounds are normal. NT. No HSM  Musculoskeletal: Normal range of motion. Exhibits no edema Lymphadenopathy: Has no other cervical adenopathy.  Neurological: Pt is alert and oriented to person, place, and time. Pt has normal reflexes. No cranial nerve deficit. Motor grossly intact, Gait intact Skin: Skin is warm and dry. No rash noted or new ulcerations Psychiatric:  Has normal mood and affect. Behavior is normal without agitation Marked OA changes to fingers without swelling or tender, no joint effusions    Assessment & Plan:

## 2016-11-12 NOTE — Assessment & Plan Note (Signed)
stable overall by history and exam, recent data reviewed with pt, and pt to continue medical treatment as before,  to f/u any worsening symptoms or concerns BP Readings from Last 3 Encounters:  11/12/16 140/70  09/12/16 127/90  09/04/16 (!) 151/69

## 2016-11-12 NOTE — Assessment & Plan Note (Signed)
stable overall by history and exam, recent data reviewed with pt, and pt to continue medical treatment as before,  to f/u any worsening symptoms or concerns Lab Results  Component Value Date   HGBA1C 5.8 11/12/2016

## 2016-11-12 NOTE — Assessment & Plan Note (Signed)
stable overall by history and exam, recent data reviewed with pt, and pt to continue medical treatment as before,  to f/u any worsening symptoms or concerns Lab Results  Component Value Date   LDLCALC 70 11/12/2016   

## 2016-11-25 DIAGNOSIS — D485 Neoplasm of uncertain behavior of skin: Secondary | ICD-10-CM | POA: Diagnosis not present

## 2016-11-25 DIAGNOSIS — L570 Actinic keratosis: Secondary | ICD-10-CM | POA: Diagnosis not present

## 2016-11-25 DIAGNOSIS — C44629 Squamous cell carcinoma of skin of left upper limb, including shoulder: Secondary | ICD-10-CM | POA: Diagnosis not present

## 2016-11-25 DIAGNOSIS — L821 Other seborrheic keratosis: Secondary | ICD-10-CM | POA: Diagnosis not present

## 2016-11-28 DIAGNOSIS — M5136 Other intervertebral disc degeneration, lumbar region: Secondary | ICD-10-CM | POA: Diagnosis not present

## 2016-11-28 DIAGNOSIS — Z4789 Encounter for other orthopedic aftercare: Secondary | ICD-10-CM | POA: Diagnosis not present

## 2016-12-02 ENCOUNTER — Telehealth: Payer: Self-pay | Admitting: Internal Medicine

## 2016-12-02 DIAGNOSIS — M542 Cervicalgia: Secondary | ICD-10-CM

## 2016-12-02 DIAGNOSIS — M79643 Pain in unspecified hand: Secondary | ICD-10-CM

## 2016-12-02 NOTE — Telephone Encounter (Signed)
I assume this must for orthopedic  This is done

## 2016-12-02 NOTE — Telephone Encounter (Signed)
Pt would like a referral to   Address: [id number], [place], [place] [address]  Hours:  Open ? Closes 4:30PM                       Phone: [phone]   For hand and neck

## 2016-12-05 DIAGNOSIS — C44622 Squamous cell carcinoma of skin of right upper limb, including shoulder: Secondary | ICD-10-CM | POA: Diagnosis not present

## 2016-12-11 NOTE — Progress Notes (Signed)
Pre visit review using our clinic review tool, if applicable. No additional management support is needed unless otherwise documented below in the visit note. 

## 2016-12-11 NOTE — Progress Notes (Addendum)
Subjective:   [name] is a 81 y.o. male who presents for an Initial Medicare Annual Wellness Visit.  Review of Systems  No ROS.  Medicare Wellness Visit.  Cardiac Risk Factors include: advanced age (>59men, >13 women);hypertension;male gender Sleep patterns: no sleep issues, gets up 1-2 times nightly to void and sleeps 5-7 hours nightly.  Patient reports insomnia issues, discussed recommended sleep tips and stress reduction tips. Home Safety/Smoke Alarms: Feels safe in home. Smoke alarms in place.   Living environment; residence and Firearm Safety: 2-story house, no firearms.Lives alone, has good family support.  Seat Belt Safety/Bike Helmet: Wears seat belt.   Counseling:   Eye Exam- appointment yearly Dental- dentures, dental assistance through [place]  Male:   CCS- Last  02/27/09 PSA-  Lab Results  Component Value Date   PSA 0.53 11/12/2016   PSA 0.29 10/28/2012   PSA 0.21 02/07/2008      Objective:    Today's Vitals   12/12/16 1312 12/12/16 1320  BP: 130/76   Pulse: (!) 58   Resp: 20   SpO2: 98%   Weight: 191 lb (86.6 kg)   Height: 5\' 7"  (1.702 m)   PainSc:  2    Body mass index is 29.91 kg/m.  Current Medications (verified) Outpatient Encounter Prescriptions as of 12/12/2016  Medication Sig  . acetaminophen (TYLENOL) 650 MG CR tablet Take 1,300 mg by mouth at bedtime as needed for pain.  Marland Kitchen albuterol (PROVENTIL HFA;VENTOLIN HFA) 108 (90 Base) MCG/ACT inhaler Inhale 2 puffs into the lungs every 6 (six) hours as needed for wheezing or shortness of breath.  Marland Kitchen amLODipine (NORVASC) 5 MG tablet Take 1 tablet (5 mg total) by mouth daily. (Patient taking differently: Take 5 mg by mouth daily as needed (fro BP reading over 160). )  . brimonidine (ALPHAGAN) 0.15 % ophthalmic solution Place 1 drop into both eyes 2 (two) times daily.  . citalopram (CELEXA) 10 MG tablet TAKE 1 TABLET EVERY MORNING  . clotrimazole (LOTRIMIN) 1 % cream Apply 1 application topically 2 (two)  times daily as needed (FOR RASH). Prescribed for rash in groin  . fluticasone (FLONASE) 50 MCG/ACT nasal spray USE 2 SPRAYS IN EACH NOSTRIL EVERY DAY AS NEEDED  FOR  RHINITIS/ALLERGIES  . gabapentin (NEURONTIN) 300 MG capsule TAKE 1 TO 2 CAPSULES FOUR TIMES DAILY  AFTER MEALS AND AT BEDTIME. (Patient taking differently: TAKE 2 CAPSULES FOUR TIMES DAILY  AFTER MEALS AND AT BEDTIME.)  . hydrochlorothiazide (HYDRODIURIL) 25 MG tablet Take 1 tablet (25 mg total) by mouth daily.  . hydroxypropyl methylcellulose / hypromellose (ISOPTO TEARS / GONIOVISC) 2.5 % ophthalmic solution Place 1 drop into both eyes 2 (two) times daily.  . influenza vac recom quadrivalent (FLUBLOK) 0.5 ML injection Inject 0.5 mLs into the muscle once.  Marland Kitchen losartan (COZAAR) 50 MG tablet Take 1 tablet (50 mg total) by mouth daily. Reported on 09/16/2015  . meclizine (ANTIVERT) 25 MG tablet TAKE 1 TABLET TWO TIMES DAILY AS NEEDED FOR DIZZINESS  . methocarbamol (ROBAXIN) 500 MG tablet Take 1 tablet (500 mg total) by mouth 3 (three) times daily as needed for muscle spasms.  . Multiple Vitamins-Minerals (EMERGEN-C FIVE PO) Take 1 packet by mouth 2 (two) times daily.  . nitroGLYCERIN (NITROSTAT) 0.4 MG SL tablet Place 1 tablet (0.4 mg total) under the tongue every 5 (five) minutes as needed. Chest pain (Patient taking differently: Place 0.4 mg under the tongue every 5 (five) minutes as needed for chest pain. Chest  pain  )  . ondansetron (ZOFRAN) 4 MG tablet Take 1 tablet (4 mg total) by mouth every 8 (eight) hours as needed for nausea or vomiting.  . pantoprazole (PROTONIX) 40 MG tablet TAKE 1 TABLET TWICE DAILY  . potassium chloride (K-DUR) 10 MEQ tablet Take 1 tablet (10 mEq total) by mouth daily.  . primidone (MYSOLINE) 250 MG tablet TAKE 1/2 TABLET THREE TIMES DAILY  . simvastatin (ZOCOR) 40 MG tablet TAKE 1 AND 1/2 TABLETS AT BEDTIME  . tamsulosin (FLOMAX) 0.4 MG CAPS capsule Take 1 capsule (0.4 mg total) by mouth daily. (Patient  taking differently: Take 0.8 mg by mouth at bedtime. )  . timolol (TIMOPTIC) 0.5 % ophthalmic solution Place 1 drop into both eyes 2 (two) times daily.   . Travoprost, BAK Free, (TRAVATAN Z) 0.004 % SOLN ophthalmic solution Place 1 drop into both eyes at bedtime.  . triamcinolone (KENALOG) 0.025 % cream Apply 1 application topically 2 (two) times daily as needed (rash).   . [DISCONTINUED] oxyCODONE-acetaminophen (PERCOCET) 10-325 MG tablet Take 1 tablet by mouth every 4 (four) hours as needed for pain. (Patient not taking: Reported on 12/12/2016)   No facility-administered encounter medications on file as of 12/12/2016.     Allergies (verified) Morphine and related; Hydrochlorothiazide; Tape; Latex; Lisinopril; and Sulfa antibiotics   History: Past Medical History:  Diagnosis Date  . Acute bronchitis per pt no fever--  cough since discharge from hospital 07-02-2015   per pcp note 07-05-2015  mild to moderate bronchitis vs pna  . Allergic rhinitis   . Bladder cancer ([place])   . BPH (benign prostatic hyperplasia)   . Bradycardia   . CAD (coronary artery disease)   . CHF (congestive heart failure) ([place])   . Chronic low back pain   . DDD (degenerative disc disease), lumbosacral   . Depression   . Diverticulosis of colon (without mention of hemorrhage)   . Dyspnea   . Essential tremor   . Fatty liver disease, nonalcoholic   . GERD (gastroesophageal reflux disease)   . Glaucoma   . History of bladder cancer    2004--  TCC low-grade , non-invasive  . History of colon polyps   . History of exercise stress test    02-05-2007-- ETT (Clinically positive, electrically equivocal, submaximal ETT,  appropriate bp response to exercise  . History of hiatal hernia   . History of kidney stones   . History of peptic ulcer   . HLD (hyperlipidemia)   . HTN (hypertension)   . Nocturia   . Productive cough   . Rheumatoid arthritis(714.0)   . Right renal artery stenosis ([facility])    per cath 03-14-2009  --- 40-50%  . RLS (restless legs syndrome)   . Vertigo, intermittent   . Weak urinary stream   . Wears dentures    upper  . Wears glasses   . Wears hearing aid    bilateral  . Wears partial dentures    lower   Past Surgical History:  Procedure Laterality Date  . CARDIAC CATHETERIZATION  02-12-2007  dr gregg taylor   Non-obstructive CAD,  20% pLCX,  20% LAD, ef 65%  . CARDIAC CATHETERIZATION  03-14-2009  dr Johnsie Cancel   pLAD and mid diaginal 20-30% multiple lesions,  OM1 20%, right renal ostial 40-50%,  ef 60%  . CATARACT EXTRACTION W/ INTRAOCULAR LENS  IMPLANT, BILATERAL  2016  . CYSTOSCOPY WITH BIOPSY N/A 07/11/2015   Procedure: CYSTOSCOPY WITH COLD CUP RESECTION AND FULGERATION;  Surgeon: Rana Snare, MD;  Location: [facility];  Service: Urology;  Laterality: N/A;  . HIP ARTHROSCOPY W/ LABRAL DEBRIDEMENT Left 06-29-2000   and chondraplasty  . LUMBAR [place] SURGERY  07-16-2005  . LUMBAR LAMINECTOMY/DECOMPRESSION MICRODISCECTOMY  07/09/2011   Procedure: LUMBAR LAMINECTOMY/DECOMPRESSION MICRODISCECTOMY;  Surgeon: Johnn Hai;  Location: [facility];  Service: Orthopedics;  Laterality: Left;  Decompression L5 - S1 on the Left and repair of dura  . LUMBAR LAMINECTOMY/DECOMPRESSION MICRODISCECTOMY N/A 07/27/2013   Procedure: DECOMPRESSION L4 - L5 WITH EXCISION OF SYNOVIAL CYST AND, LATERAL MASS FUSION 1 LEVEL;  Surgeon: Johnn Hai, MD;  Location: [facility];  Service: Orthopedics;  Laterality: N/A;  . LUMBAR LAMINECTOMY/DECOMPRESSION MICRODISCECTOMY Left 07/06/2014   Procedure:  MICRO LUMBAR DECOMPRESSION L5-S1 ON LEFT, L4-5 REDO;  Surgeon: Johnn Hai, MD;  Location: [facility];  Service: Orthopedics;  Laterality: Left;  . SPINAL CORD STIMULATOR INSERTION N/A 09/11/2016   Procedure: Spinal cord stimulator placement;  Surgeon: Melina Schools, MD;  Location: [facility];  Service: Orthopedics;  Laterality: N/A;  . TONSILLECTOMY AND ADENOIDECTOMY    . TRANSTHORACIC ECHOCARDIOGRAM   09-20-2010   normal LVF, ef 55-65%/  mild MR, TR and PR/  mild LAE  . TRANSURETHRAL RESECTION OF BLADDER TUMOR  09-21-2002   Family History  Problem Relation Age of Onset  . Heart attack Mother   . Cancer Father        lung  . Cancer Sister        lung  . Diabetes Sister   . Cancer Brother        lung that spread to brain   Social History   Occupational History  . retired from [employer] Retired   Social History Main Topics  . Smoking status: Former Smoker    Packs/day: 2.00    Years: 20.00    Types: Cigarettes    Quit date: 10/08/1982  . Smokeless tobacco: Never Used  . Alcohol use No  . Drug use: No  . Sexual activity: Not Currently   Tobacco Counseling Counseling given: Not Answered   Activities of Daily Living In your present state of health, do you have any difficulty performing the following activities: 12/12/2016 09/04/2016  Hearing? N Y  Vision? N Y  Difficulty concentrating or making decisions? N Y  Walking or climbing stairs? N Y  Dressing or bathing? N N  Doing errands, shopping? N -  Preparing Food and eating ? N -  Using the Toilet? N -  In the past six months, have you accidently leaked urine? N -  Do you have problems with loss of bowel control? N -  Managing your Medications? N -  Managing your Finances? N -  Housekeeping or managing your Housekeeping? N -  Some recent data might be hidden    Immunizations and Health Maintenance Immunization History  Administered Date(s) Administered  . Influenza Split 03/14/2012  . Influenza Whole 05/17/2007, 04/19/2013  . Influenza,inj,Quad PF,36+ Mos 04/05/2014, 06/04/2016  . Influenza-Unspecified 05/14/2015  . Pneumococcal Conjugate-13 05/16/2014  . Pneumococcal Polysaccharide-23 05/17/2007  . Tdap 11/28/2010  . Zoster 10/25/2013   There are no preventive care reminders to display for this patient.  Patient Care Team: Biagio Borg, MD as PCP - General  Indicate any recent Medical Services you may  have received from other than [facility] providers in the past year (date may be approximate).    Assessment:   This is a routine wellness examination for [place].Physical assessment  deferred to PCP.   Hearing/Vision screen Hearing Screening Comments: HOH, has hearing aides, followed by the [place] Screening Comments: Wear glasses  Dietary issues and exercise activities discussed: Current Exercise Habits: Home exercise routine, Type of exercise: walking;strength training/weights;calisthenics, Time (Minutes): 50, Frequency (Times/Week): 5, Weekly Exercise (Minutes/Week): 250, Intensity: Moderate, Exercise limited by: None identified  Diet (meal preparation, eat out, water intake, caffeinated beverages, dairy products, fruits and vegetables): in general, a "healthy" diet  , well balanced, low fat/ cholesterol, low salt eats a variety of fruits and vegetables daily, limits salt, fat/cholesterol, sugar, caffeine, drinks 1-3 glasses of water daily.  Encouraged patient to increase daily water intake.   Goals    . Stay as socially active as possible, continue to exercise, and eat healthy      Depression Screen PHQ 2/9 Scores 12/12/2016 11/12/2016 05/17/2015 10/20/2013  PHQ - 2 Score 1 0 1 0    Fall Risk Fall Risk  12/12/2016 11/12/2016 06/19/2016 05/17/2015 10/20/2013  Falls in the past year? No No No No No    Cognitive Function:       Ad8 score reviewed for issues:  Issues making decisions: no  Less interest in hobbies / activities: no  Repeats questions, stories (family complaining): no  Trouble using ordinary gadgets (microwave, computer, phone): no  Forgets the month or year: no  Mismanaging finances: no  Remembering appts: no  Daily problems with thinking and/or memory: no Ad8 score is= 0  Screening Tests Health Maintenance  Topic Date Due  . INFLUENZA VACCINE  02/11/2017  . TETANUS/TDAP  11/27/2020  . PNA vac Low Risk Adult  Completed        Plan:    Continue to eat heart  healthy diet (full of fruits, vegetables, whole grains, lean protein, water--limit salt, fat, and sugar intake) and increase physical activity as tolerated.  Continue doing brain stimulating activities (puzzles, reading, adult coloring books, staying active) to keep memory sharp.    I have personally reviewed and noted the following in the patient's chart:   . Medical and social history . Use of alcohol, tobacco or illicit drugs  . Current medications and supplements . Functional ability and status . Nutritional status . Physical activity . Advanced directives . List of other physicians . Vitals . Screenings to include cognitive, depression, and falls . Referrals and appointments  In addition, I have reviewed and discussed with patient certain preventive protocols, quality metrics, and best practice recommendations. A written personalized care plan for preventive services as well as general preventive health recommendations were provided to patient.     Michiel Cowboy, RN   12/12/2016    Medical screening examination/treatment/procedure(s) were performed by non-physician practitioner and as supervising physician I was immediately available for consultation/collaboration. I agree with above. Cathlean Cower, MD

## 2016-12-12 ENCOUNTER — Ambulatory Visit (INDEPENDENT_AMBULATORY_CARE_PROVIDER_SITE_OTHER): Payer: Medicare Other | Admitting: *Deleted

## 2016-12-12 ENCOUNTER — Telehealth: Payer: Self-pay | Admitting: *Deleted

## 2016-12-12 VITALS — BP 130/76 | HR 58 | Resp 20 | Ht 67.0 in | Wt 191.0 lb

## 2016-12-12 DIAGNOSIS — Z0000 Encounter for general adult medical examination without abnormal findings: Secondary | ICD-10-CM | POA: Diagnosis not present

## 2016-12-12 DIAGNOSIS — M069 Rheumatoid arthritis, unspecified: Secondary | ICD-10-CM

## 2016-12-12 NOTE — Patient Instructions (Signed)
Continue to eat heart healthy diet (full of fruits, vegetables, whole grains, lean protein, water--limit salt, fat, and sugar intake) and increase physical activity as tolerated.  Continue doing brain stimulating activities (puzzles, reading, adult coloring books, staying active) to keep memory sharp.    [name] , Thank you for taking time to come for your Medicare Wellness Visit. I appreciate your ongoing commitment to your health goals. Please review the following plan we discussed and let me know if I can assist you in the future.   These are the goals we discussed: Goals    . Stay as socially active as possible, continue to exercise, and eat healthy       This is a list of the screening recommended for you and due dates:  Health Maintenance  Topic Date Due  . Flu Shot  02/11/2017  . Tetanus Vaccine  11/27/2020  . Pneumonia vaccines  Completed

## 2016-12-12 NOTE — Addendum Note (Signed)
Addendum  created 12/12/16 1053 by Effie Berkshire, MD   Sign clinical note

## 2016-12-12 NOTE — Telephone Encounter (Signed)
During AWV, patient requested to have a refill on nitroglycerin, what he has now has expired. He also asked if he could have a referral to [place] for arthritis issues. He knows these doctors as his wife use to go there.

## 2016-12-14 MED ORDER — NITROGLYCERIN 0.4 MG SL SUBL: 0.4000 mg | SUBLINGUAL_TABLET | SUBLINGUAL | 5 refills | Status: DC | PRN

## 2016-12-14 NOTE — Telephone Encounter (Signed)
Refill and referral done

## 2016-12-25 ENCOUNTER — Ambulatory Visit (INDEPENDENT_AMBULATORY_CARE_PROVIDER_SITE_OTHER): Payer: Medicare Other | Admitting: Podiatry

## 2016-12-25 DIAGNOSIS — M79676 Pain in unspecified toe(s): Secondary | ICD-10-CM

## 2016-12-25 DIAGNOSIS — M779 Enthesopathy, unspecified: Secondary | ICD-10-CM

## 2016-12-25 DIAGNOSIS — B351 Tinea unguium: Secondary | ICD-10-CM

## 2016-12-25 MED ORDER — TRIAMCINOLONE ACETONIDE 10 MG/ML IJ SUSP
10.0000 mg | Freq: Once | INTRAMUSCULAR | Status: AC
  Administered 2016-12-25: 10 mg

## 2016-12-26 ENCOUNTER — Other Ambulatory Visit: Payer: Self-pay | Admitting: Internal Medicine

## 2016-12-26 NOTE — Progress Notes (Signed)
Subjective:    Patient ID: [name], male   DOB: 81 y.o.   MRN: [id number]   HPI patient presents with ingrown toenail deformity hallux bilateral 1-5 that's been painful and also has inflammatory changes of the first MPJ right that continues to give him problems    ROS      Objective:  Physical Exam neurovascular status intact negative Homans sign was noted with patient found to have continued discomfort first MPJ right and nail disease 1-5 both feet with thick yellow brittle nails that are painful     Assessment:  Mycotic nail infection with pain 1-5 both feet and inflammatory capsulitis first MPJ right       Plan:    H&P both conditions discussed and careful injection of the first MPJ right was performed 3 mg Kenalog 5 mg Xylocaine and debrided nailbeds 1-5 both feet with no iatrogenic bleeding noted

## 2016-12-29 NOTE — Telephone Encounter (Signed)
Pt called about these refills. I told him that Dr Jenny Reichmann does not work on Monday but he would get to them as soon as he could.

## 2016-12-29 NOTE — Telephone Encounter (Signed)
Pt had cpx back in May sent refills to [facility]...[name]

## 2016-12-29 NOTE — Telephone Encounter (Signed)
Sent refill to local pharmacy...[name]

## 2017-01-05 ENCOUNTER — Telehealth: Payer: Self-pay | Admitting: Internal Medicine

## 2017-01-05 NOTE — Telephone Encounter (Signed)
Pt would like a refill of potassium chloride (K-DUR) 10 MEQ tablet Please send to [place]   He would like to know why he was cut back to 10 mg a day

## 2017-01-06 MED ORDER — POTASSIUM CHLORIDE ER 10 MEQ PO TBCR: 20.0000 meq | EXTENDED_RELEASE_TABLET | Freq: Every day | ORAL | 0 refills | Status: DC

## 2017-01-06 NOTE — Telephone Encounter (Signed)
Ok, new corrected rx sent to [facility]

## 2017-01-07 ENCOUNTER — Other Ambulatory Visit: Payer: Self-pay | Admitting: Internal Medicine

## 2017-01-07 NOTE — Telephone Encounter (Signed)
Informed pt correct script was sent in

## 2017-01-16 DIAGNOSIS — Z85828 Personal history of other malignant neoplasm of skin: Secondary | ICD-10-CM | POA: Diagnosis not present

## 2017-01-16 DIAGNOSIS — L821 Other seborrheic keratosis: Secondary | ICD-10-CM | POA: Diagnosis not present

## 2017-01-20 ENCOUNTER — Encounter (HOSPITAL_COMMUNITY): Payer: Self-pay

## 2017-01-20 ENCOUNTER — Emergency Department (HOSPITAL_COMMUNITY)
Admission: EM | Admit: 2017-01-20 | Discharge: 2017-01-20 | Disposition: A | Discharge status: Home / Self Care | Payer: Medicare Other | Attending: Emergency Medicine | Admitting: Emergency Medicine

## 2017-01-20 ENCOUNTER — Emergency Department (HOSPITAL_COMMUNITY): Payer: Medicare Other

## 2017-01-20 DIAGNOSIS — Y92009 Unspecified place in unspecified non-institutional (private) residence as the place of occurrence of the external cause: Secondary | ICD-10-CM | POA: Diagnosis not present

## 2017-01-20 DIAGNOSIS — Y999 Unspecified external cause status: Secondary | ICD-10-CM | POA: Insufficient documentation

## 2017-01-20 DIAGNOSIS — I110 Hypertensive heart disease with heart failure: Secondary | ICD-10-CM | POA: Diagnosis not present

## 2017-01-20 DIAGNOSIS — Y939 Activity, unspecified: Secondary | ICD-10-CM | POA: Insufficient documentation

## 2017-01-20 DIAGNOSIS — S51811A Laceration without foreign body of right forearm, initial encounter: Secondary | ICD-10-CM | POA: Diagnosis not present

## 2017-01-20 DIAGNOSIS — Z91040 Latex allergy status: Secondary | ICD-10-CM | POA: Diagnosis not present

## 2017-01-20 DIAGNOSIS — W010XXA Fall on same level from slipping, tripping and stumbling without subsequent striking against object, initial encounter: Secondary | ICD-10-CM | POA: Insufficient documentation

## 2017-01-20 DIAGNOSIS — Z87891 Personal history of nicotine dependence: Secondary | ICD-10-CM | POA: Diagnosis not present

## 2017-01-20 DIAGNOSIS — W19XXXA Unspecified fall, initial encounter: Secondary | ICD-10-CM

## 2017-01-20 DIAGNOSIS — Z79899 Other long term (current) drug therapy: Secondary | ICD-10-CM | POA: Diagnosis not present

## 2017-01-20 DIAGNOSIS — I509 Heart failure, unspecified: Secondary | ICD-10-CM | POA: Insufficient documentation

## 2017-01-20 DIAGNOSIS — I2510 Atherosclerotic heart disease of native coronary artery without angina pectoris: Secondary | ICD-10-CM | POA: Insufficient documentation

## 2017-01-20 DIAGNOSIS — S51831A Puncture wound without foreign body of right forearm, initial encounter: Secondary | ICD-10-CM | POA: Diagnosis not present

## 2017-01-20 DIAGNOSIS — S59911A Unspecified injury of right forearm, initial encounter: Secondary | ICD-10-CM | POA: Diagnosis present

## 2017-01-20 NOTE — ED Triage Notes (Signed)
Patient states his cat tripped him and he landed won a glider with his right arm. Patient has bruising and skin tears. Patient also has small abrasions to the right knee

## 2017-01-20 NOTE — ED Provider Notes (Signed)
[facility] DEPT Provider Note   CSN: [id number] Arrival date & time: 01/20/17  1716     History   Chief Complaint Chief Complaint  Patient presents with  . Fall  . Arm Injury    HPI [name] is a 81 y.o. male.  HPI Patient had fall from standing after being tripped by his cat earlier today. When it on his right arm. Sustained a superficial skin tears to the right forearm. States he has some underlying tenderness. No wrist, elbow or shoulder pain. Denies hitting his head area no loss of consciousness. Denies chest pain or back pain. Past Medical History:  Diagnosis Date  . Acute bronchitis per pt no fever--  cough since discharge from hospital 07-02-2015   per pcp note 07-05-2015  mild to moderate bronchitis vs pna  . Allergic rhinitis   . Bladder cancer ([place])   . BPH (benign prostatic hyperplasia)   . Bradycardia   . CAD (coronary artery disease)   . CHF (congestive heart failure) ([place])   . Chronic low back pain   . DDD (degenerative disc disease), lumbosacral   . Depression   . Diverticulosis of colon (without mention of hemorrhage)   . Dyspnea   . Essential tremor   . Fatty liver disease, nonalcoholic   . GERD (gastroesophageal reflux disease)   . Glaucoma   . History of bladder cancer    2004--  TCC low-grade , non-invasive  . History of colon polyps   . History of exercise stress test    02-05-2007-- ETT (Clinically positive, electrically equivocal, submaximal ETT,  appropriate bp response to exercise  . History of hiatal hernia   . History of kidney stones   . History of peptic ulcer   . HLD (hyperlipidemia)   . HTN (hypertension)   . Nocturia   . Productive cough   . Rheumatoid arthritis(714.0)   . Right renal artery stenosis (H[facility])    per cath 03-14-2009 --- 40-50%  . RLS (restless legs syndrome)   . Vertigo, intermittent   . Weak urinary stream   . Wears dentures    upper  . Wears glasses   . Wears hearing aid    bilateral  . Wears partial  dentures    lower    Patient Active Problem List   Diagnosis Date Noted  . Chronic pain 09/11/2016  . Generalized weakness 05/22/2016  . UTI (urinary tract infection) 05/22/2016  . Weakness 05/22/2016  . Fever blister 10/18/2015  . Dyspnea 10/17/2015  . Bradycardia 09/17/2015  . Chest pain at rest 09/17/2015  . Depression 09/17/2015  . CAD in native artery 09/17/2015  . CHF (congestive heart failure) ([place]) 09/17/2015  . Pulmonary hypertension ([place]) 09/17/2015  . Bladder cancer ([place]) 09/17/2015  . Essential tremor 09/17/2015  . Chest pain 09/17/2015  . Anginal chest pain at rest [facility]) 09/17/2015  . Hypokalemia 07/13/2015  . Vertigo 07/01/2015  . Dizzinesses 07/01/2015  . Spinal stenosis of lumbar region at multiple levels 07/06/2014  . Angular cheilitis 05/05/2014  . Left lumbar radiculopathy 05/04/2014  . Right elbow pain 12/29/2013  . Lateral epicondylitis of right elbow 12/29/2013  . Cough 09/14/2013  . Spinal stenosis, lumbar 07/27/2013  . Palpable mass of neck 07/21/2013  . Vasovagal episode 01/10/2013  . Left otitis media 10/28/2012  . Impaired glucose tolerance 10/28/2012  . Cellulitis 04/24/2012  . Hemorrhoids 04/24/2012  . Syncope 04/21/2012  . Preventative health care 11/28/2010  . DEPRESSION 09/12/2010  . RENAL ARTERY  STENOSIS 09/12/2010  . FATIGUE 09/12/2010  . BUNION, RIGHT FOOT 02/05/2010  . RECTAL BLEEDING 07/10/2009  . Diarrhea 07/10/2009  . DIVERTICULOSIS OF COLON 03/28/2009  . FATTY LIVER DISEASE 03/28/2009  . BRADYCARDIA 03/20/2009  . DIZZINESS 10/17/2008  . CHEST PAIN 10/17/2008  . Malignant neoplasm of bladder ([place]) 02/07/2008  . ALLERGIC RHINITIS 02/07/2008  . COLONIC POLYPS, HX OF 02/07/2008  . Hyperlipidemia 11/24/2007  . Essential hypertension 11/24/2007  . GERD 11/24/2007  . HIATAL HERNIA WITH REFLUX 11/24/2007  . RENAL INSUFFICIENCY 11/24/2007  . Rheumatoid arthritis(714.0) 11/24/2007  . [place] DISEASE, LUMBAR 11/24/2007  .  ADENOIDECTOMY, HX OF 11/24/2007    Past Surgical History:  Procedure Laterality Date  . CARDIAC CATHETERIZATION  02-12-2007  dr gregg taylor   Non-obstructive CAD,  20% pLCX,  20% LAD, ef 65%  . CARDIAC CATHETERIZATION  03-14-2009  dr Johnsie Cancel   pLAD and mid diaginal 20-30% multiple lesions,  OM1 20%, right renal ostial 40-50%,  ef 60%  . CATARACT EXTRACTION W/ INTRAOCULAR LENS  IMPLANT, BILATERAL  2016  . CYSTOSCOPY WITH BIOPSY N/A 07/11/2015   Procedure: CYSTOSCOPY WITH COLD CUP RESECTION AND FULGERATION;  Surgeon: Rana Snare, MD;  [facility];  Service: Urology;  Laterality: N/A;  . HIP ARTHROSCOPY W/ LABRAL DEBRIDEMENT Left 06-29-2000   and chondraplasty  . LUMBAR [place] SURGERY  07-16-2005  . LUMBAR LAMINECTOMY/DECOMPRESSION MICRODISCECTOMY  07/09/2011   Procedure: LUMBAR LAMINECTOMY/DECOMPRESSION MICRODISCECTOMY;  Surgeon: Johnn Hai;  Location: [facility];  Service: Orthopedics;  Laterality: Left;  Decompression L5 - S1 on the Left and repair of dura  . LUMBAR LAMINECTOMY/DECOMPRESSION MICRODISCECTOMY N/A 07/27/2013   Procedure: DECOMPRESSION L4 - L5 WITH EXCISION OF SYNOVIAL CYST AND, LATERAL MASS FUSION 1 LEVEL;  Surgeon: Johnn Hai, MD;  Location: [facility];  Service: Orthopedics;  Laterality: N/A;  . LUMBAR LAMINECTOMY/DECOMPRESSION MICRODISCECTOMY Left 07/06/2014   Procedure:  MICRO LUMBAR DECOMPRESSION L5-S1 ON LEFT, L4-5 REDO;  Surgeon: Johnn Hai, MD;  Location: [facility];  Service: Orthopedics;  Laterality: Left;  . SPINAL CORD STIMULATOR INSERTION N/A 09/11/2016   Procedure: Spinal cord stimulator placement;  Surgeon: Melina Schools, MD;  Location: [facility];  Service: Orthopedics;  Laterality: N/A;  . TONSILLECTOMY AND ADENOIDECTOMY    . TRANSTHORACIC ECHOCARDIOGRAM  09-20-2010   normal LVF, ef 55-65%/  mild MR, TR and PR/  mild LAE  . TRANSURETHRAL RESECTION OF BLADDER TUMOR  09-21-2002       Home Medications    Prior to Admission medications     Medication Sig Start Date End Date Taking? Authorizing Provider  acetaminophen (TYLENOL) 650 MG CR tablet Take 1,300 mg by mouth at bedtime as needed for pain.    [provider]  albuterol (PROVENTIL HFA;VENTOLIN HFA) 108 (90 Base) MCG/ACT inhaler Inhale 2 puffs into the lungs every 6 (six) hours as needed for wheezing or shortness of breath. 06/04/16   Biagio Borg, MD  amLODipine (NORVASC) 5 MG tablet TAKE 1 TABLET (5 MG TOTAL) BY MOUTH DAILY. 12/29/16   Biagio Borg, MD  brimonidine (ALPHAGAN) 0.15 % ophthalmic solution Place 1 drop into both eyes 2 (two) times daily.    [provider]  citalopram (CELEXA) 10 MG tablet TAKE 1 TABLET EVERY MORNING 01/07/17   Biagio Borg, MD  clotrimazole (LOTRIMIN) 1 % cream Apply 1 application topically 2 (two) times daily as needed (FOR RASH). Prescribed for rash in groin    [provider]  fluticasone Asencion Islam)  50 MCG/ACT nasal spray USE 2 SPRAYS IN EACH NOSTRIL EVERY DAY AS NEEDED  FOR  RHINITIS/ALLERGIES 08/16/16   Biagio Borg, MD  gabapentin (NEURONTIN) 300 MG capsule TAKE 1 TO 2 CAPSULES FOUR TIMES DAILY  AFTER MEALS AND AT BEDTIME. Patient taking differently: TAKE 2 CAPSULES FOUR TIMES DAILY  AFTER MEALS AND AT BEDTIME. 05/05/16   Biagio Borg, MD  hydrochlorothiazide (HYDRODIURIL) 25 MG tablet Take 1 tablet (25 mg total) by mouth daily. 08/14/16   Biagio Borg, MD  hydroxypropyl methylcellulose / hypromellose (ISOPTO TEARS / GONIOVISC) 2.5 % ophthalmic solution Place 1 drop into both eyes 2 (two) times daily.    [provider]  influenza vac recom quadrivalent (FLUBLOK) 0.5 ML injection Inject 0.5 mLs into the muscle once.    [provider]  losartan (COZAAR) 50 MG tablet TAKE 1 TABLET EVERY DAY 12/29/16   Biagio Borg, MD  meclizine (ANTIVERT) 25 MG tablet TAKE 1 TABLET BY MOUTH TWICE DAILY AS NEEDED FOR DIZZINESS 12/29/16   Biagio Borg, MD  methocarbamol (ROBAXIN) 500 MG tablet Take 1 tablet (500 mg  total) by mouth 3 (three) times daily as needed for muscle spasms. 09/11/16   Melina Schools, MD  Multiple Vitamins-Minerals (EMERGEN-C FIVE PO) Take 1 packet by mouth 2 (two) times daily.    [provider]  nitroGLYCERIN (NITROSTAT) 0.4 MG SL tablet Place 1 tablet (0.4 mg total) under the tongue every 5 (five) minutes as needed. Chest pain 12/14/16   Biagio Borg, MD  ondansetron (ZOFRAN) 4 MG tablet Take 1 tablet (4 mg total) by mouth every 8 (eight) hours as needed for nausea or vomiting. 09/11/16   Melina Schools, MD  pantoprazole (PROTONIX) 40 MG tablet TAKE 1 TABLET TWICE DAILY 10/13/16   Biagio Borg, MD  potassium chloride (K-DUR) 10 MEQ tablet Take 2 tablets (20 mEq total) by mouth daily. 01/06/17   Biagio Borg, MD  primidone (MYSOLINE) 250 MG tablet TAKE 1/2 TABLET THREE TIMES DAILY 12/29/16   Biagio Borg, MD  simvastatin (ZOCOR) 40 MG tablet TAKE 1 AND 1/2 TABLETS AT BEDTIME 12/29/16   Biagio Borg, MD  tamsulosin (FLOMAX) 0.4 MG CAPS capsule Take 1 capsule (0.4 mg total) by mouth daily. Patient taking differently: Take 0.8 mg by mouth at bedtime.  05/17/15   Biagio Borg, MD  timolol (TIMOPTIC) 0.5 % ophthalmic solution Place 1 drop into both eyes 2 (two) times daily.  05/30/15   [provider]  Travoprost, BAK Free, (TRAVATAN Z) 0.004 % SOLN ophthalmic solution Place 1 drop into both eyes at bedtime. 09/14/13   Biagio Borg, MD  triamcinolone (KENALOG) 0.025 % cream Apply 1 application topically 2 (two) times daily as needed (rash).     [provider]    Family History Family History  Problem Relation Age of Onset  . Heart attack Mother   . Cancer Father        lung  . Cancer Sister        lung  . Diabetes Sister   . Cancer Brother        lung that spread to brain    Social History Social History  Substance Use Topics  . Smoking status: Former Smoker    Packs/day: 2.00    Years: 20.00    Types: Cigarettes    Quit date: 10/08/1982  . Smokeless  tobacco: Never Used  . Alcohol use No  Allergies   Morphine and related; Hydrochlorothiazide; Tape; Latex; Lisinopril; and Sulfa antibiotics   Review of Systems Review of Systems  Constitutional: Negative for chills and fever.  Eyes: Negative for visual disturbance.  Respiratory: Negative for cough and shortness of breath.   Cardiovascular: Negative for chest pain and palpitations.  Gastrointestinal: Negative for abdominal pain, diarrhea, nausea and vomiting.  Genitourinary: Negative for dysuria, flank pain, frequency and hematuria.  Musculoskeletal: Positive for myalgias. Negative for arthralgias, back pain, joint swelling, neck pain and neck stiffness.  Skin: Positive for wound. Negative for rash.  Neurological: Negative for dizziness, syncope, weakness, light-headedness, numbness and headaches.  All other systems reviewed and are negative.    Physical Exam Updated Vital Signs BP 140/71   Pulse 60   Temp 98.2 F (36.8 C) (Oral)   Resp 16   Ht 5\' 7"  (1.702 m)   Wt 84.4 kg (186 lb)   SpO2 97%   BMI 29.13 kg/m   Physical Exam  Constitutional: He is oriented to person, place, and time. He appears well-developed and well-nourished. No distress.  HENT:  Head: Normocephalic and atraumatic.  Mouth/Throat: Oropharynx is clear and moist. No oropharyngeal exudate.  Eyes: EOM are normal. Pupils are equal, round, and reactive to light.  Neck: Normal range of motion. Neck supple.  No posterior midline cervical tenderness to palpation.  Cardiovascular: Normal rate and regular rhythm.  Exam reveals no gallop and no friction rub.   No murmur heard. Pulmonary/Chest: Effort normal and breath sounds normal. No respiratory distress. He has no wheezes. He has no rales. He exhibits no tenderness.  Abdominal: Soft. Bowel sounds are normal. There is no tenderness. There is no rebound and no guarding.  Musculoskeletal: Normal range of motion. He exhibits no edema or tenderness.  No  midline thoracic or lumbar tenderness. Pelvis is stable. Patient has mild tenderness to palpation over the mid forearm on the right. No right wrist, elbow, shoulder swelling, erythema, deformity or decreased range of motion. 2+ distal pulses in all extremities.  Neurological: He is alert and oriented to person, place, and time.  Skin: Skin is warm and dry. Capillary refill takes less than 2 seconds. No rash noted. No erythema.  Multiple small superficial skin tears to the mid right forearm. No obvious contamination. No active bleeding.  Psychiatric: He has a normal mood and affect. His behavior is normal.  Nursing note and vitals reviewed.    ED Treatments / Results  Labs (all labs ordered are listed, but only abnormal results are displayed) Labs Reviewed - No data to display  EKG  EKG Interpretation None       Radiology Dg Forearm Right  Result Date: 01/20/2017 CLINICAL DATA:  Tripped over cat leading to fall. Bruising and skin tear of the right forearm. EXAM: RIGHT FOREARM - 2 VIEW COMPARISON:  None. FINDINGS: There is no evidence of fracture or other focal bone lesions. Punctate foreign body in the soft tissues about the dorsal distal forearm. IMPRESSION: No fracture or acute osseous abnormality. Punctate foreign body in the soft tissues of the dorsal distal forearm, of uncertain acuity. Electronically Signed   By: Jeb Levering M.D.   On: 01/20/2017 18:16    Procedures Procedures (including critical care time)  Medications Ordered in ED Medications - No data to display   Initial Impression / Assessment and Plan / ED Course  I have reviewed the triage vital signs and the nursing notes.  Pertinent labs & imaging results that were available  during my care of the patient were reviewed by me and considered in my medical decision making (see chart for details).    Soft tissue foreign body on x-ray is more distal than skin tears. No obvious distal injury. Wound care in the  emergency department. Return precautions given.   Final Clinical Impressions(s) / ED Diagnoses   Final diagnoses:  Fall, initial encounter  Skin tear of forearm without complication, right, initial encounter    New Prescriptions New Prescriptions   No medications on file     Julianne Rice, MD 01/20/17 1906

## 2017-01-21 ENCOUNTER — Encounter: Payer: Self-pay | Admitting: Internal Medicine

## 2017-01-21 ENCOUNTER — Ambulatory Visit (INDEPENDENT_AMBULATORY_CARE_PROVIDER_SITE_OTHER): Payer: Medicare Other | Admitting: Internal Medicine

## 2017-01-21 VITALS — BP 146/86 | Ht 67.0 in | Wt 188.0 lb

## 2017-01-21 DIAGNOSIS — I10 Essential (primary) hypertension: Secondary | ICD-10-CM | POA: Diagnosis not present

## 2017-01-21 DIAGNOSIS — R7302 Impaired glucose tolerance (oral): Secondary | ICD-10-CM | POA: Diagnosis not present

## 2017-01-21 DIAGNOSIS — W19XXXA Unspecified fall, initial encounter: Secondary | ICD-10-CM | POA: Insufficient documentation

## 2017-01-21 DIAGNOSIS — M5416 Radiculopathy, lumbar region: Secondary | ICD-10-CM | POA: Diagnosis not present

## 2017-01-21 DIAGNOSIS — M7130 Other bursal cyst, unspecified site: Secondary | ICD-10-CM | POA: Insufficient documentation

## 2017-01-21 NOTE — Progress Notes (Signed)
Subjective:    Patient ID: COREE BRAME, male    DOB: 1935-10-29, 81 y.o.   MRN: [id number]  HPI  Here to f/u with c/o fall without injury, but right leg just gave out suddenly while standing talking, just lost power without warning; suffered right knee/right arm abrasions;  Has neg plain films last PM.  Pt denies chest pain, increased sob or doe, wheezing, orthopnea, PND, increased LE swelling, palpitations, dizziness or syncope, but Pt continues to have persistent right LBP, bowel or bladder change, fever, wt loss.  No warning pain or numbness prior, denies other acute illness such as UTI symptoms or prod cough to lead to weakness.  Was seen at [facility] yesterday with "full panel" labs, reportedly ok per pt, forgot to bring results with him today.  Is s/p Left spinal stimulator and seems to cont to work well, but right back pain is persistent  Has f/u appt incidentally with Dr Gloris Manchester in 1 wk.  Has known right sided synovial cyst at last MRI with suggestion of nerve root impingement - sept 2015.  Pt denies polydipsia, polyuria  Past Medical History:  Diagnosis Date  . Acute bronchitis per pt no fever--  cough since discharge from hospital 07-02-2015   per pcp note 07-05-2015  mild to moderate bronchitis vs pna  . Allergic rhinitis   . Bladder cancer ([place])   . BPH (benign prostatic hyperplasia)   . Bradycardia   . CAD (coronary artery disease)   . CHF (congestive heart failure) ([place])   . Chronic low back pain   . DDD (degenerative disc disease), lumbosacral   . Depression   . Diverticulosis of colon (without mention of hemorrhage)   . Dyspnea   . Essential tremor   . Fatty liver disease, nonalcoholic   . GERD (gastroesophageal reflux disease)   . Glaucoma   . History of bladder cancer    2004--  TCC low-grade , non-invasive  . History of colon polyps   . History of exercise stress test    02-05-2007-- ETT (Clinically positive, electrically equivocal, submaximal ETT,  appropriate bp  response to exercise  . History of hiatal hernia   . History of kidney stones   . History of peptic ulcer   . HLD (hyperlipidemia)   . HTN (hypertension)   . Nocturia   . Productive cough   . Rheumatoid arthritis(714.0)   . Right renal artery stenosis (HCC)    per cath 03-14-2009 --- 40-50%  . RLS (restless legs syndrome)   . Vertigo, intermittent   . Weak urinary stream   . Wears dentures    upper  . Wears glasses   . Wears hearing aid    bilateral  . Wears partial dentures    lower   Past Surgical History:  Procedure Laterality Date  . CARDIAC CATHETERIZATION  02-12-2007  dr gregg taylor   Non-obstructive CAD,  20% pLCX,  20% LAD, ef 65%  . CARDIAC CATHETERIZATION  03-14-2009  dr Johnsie Cancel   pLAD and mid diaginal 20-30% multiple lesions,  OM1 20%, right renal ostial 40-50%,  ef 60%  . CATARACT EXTRACTION W/ INTRAOCULAR LENS  IMPLANT, BILATERAL  2016  . CYSTOSCOPY WITH BIOPSY N/A 07/11/2015   Procedure: CYSTOSCOPY WITH COLD CUP RESECTION AND FULGERATION;  Surgeon: Rana Snare, MD;  Location: [facility];  Service: Urology;  Laterality: N/A;  . HIP ARTHROSCOPY W/ LABRAL DEBRIDEMENT Left 06-29-2000   and chondraplasty  . LUMBAR [place] SURGERY  07-16-2005  .  LUMBAR LAMINECTOMY/DECOMPRESSION MICRODISCECTOMY  07/09/2011   Procedure: LUMBAR LAMINECTOMY/DECOMPRESSION MICRODISCECTOMY;  Surgeon: Johnn Hai;  [facility];  Service: Orthopedics;  Laterality: Left;  Decompression L5 - S1 on the Left and repair of dura  . LUMBAR LAMINECTOMY/DECOMPRESSION MICRODISCECTOMY N/A 07/27/2013   Procedure: DECOMPRESSION L4 - L5 WITH EXCISION OF SYNOVIAL CYST AND, LATERAL MASS FUSION 1 LEVEL;  Surgeon: Johnn Hai, MD;  [facility];  Service: Orthopedics;  Laterality: N/A;  . LUMBAR LAMINECTOMY/DECOMPRESSION MICRODISCECTOMY Left 07/06/2014   Procedure:  MICRO LUMBAR DECOMPRESSION L5-S1 ON LEFT, L4-5 REDO;  Surgeon: Johnn Hai, MD;  [facility];  Service:  Orthopedics;  Laterality: Left;  . SPINAL CORD STIMULATOR INSERTION N/A 09/11/2016   Procedure: Spinal cord stimulator placement;  Surgeon: Melina Schools, MD;  Location: [place];  Service: Orthopedics;  Laterality: N/A;  . TONSILLECTOMY AND ADENOIDECTOMY    . TRANSTHORACIC ECHOCARDIOGRAM  09-20-2010   normal LVF, ef 55-65%/  mild MR, TR and PR/  mild LAE  . TRANSURETHRAL RESECTION OF BLADDER TUMOR  09-21-2002    reports that he quit smoking about 34 years ago. His smoking use included Cigarettes. He has a 40.00 pack-year smoking history. He has never used smokeless tobacco. He reports that he does not drink alcohol or use drugs. family history includes Cancer in his brother, father, and sister; Diabetes in his sister; Heart attack in his mother. Allergies  Allergen Reactions  . Morphine And Related Other (See Comments)    Migraine   . Hydrochlorothiazide Other (See Comments)    REACTION: hyponatremia with daily use  . Tape Itching    Patient  Is ok to use paper tape  . Latex Rash  . Lisinopril Cough  . Sulfa Antibiotics Rash   Current Outpatient Prescriptions on File Prior to Visit  Medication Sig Dispense Refill  . acetaminophen (TYLENOL) 650 MG CR tablet Take 1,300 mg by mouth at bedtime as needed for pain.    Marland Kitchen albuterol (PROVENTIL HFA;VENTOLIN HFA) 108 (90 Base) MCG/ACT inhaler Inhale 2 puffs into the lungs every 6 (six) hours as needed for wheezing or shortness of breath. 1 Inhaler 11  . amLODipine (NORVASC) 5 MG tablet TAKE 1 TABLET (5 MG TOTAL) BY MOUTH DAILY. 90 tablet 2  . brimonidine (ALPHAGAN) 0.15 % ophthalmic solution Place 1 drop into both eyes 2 (two) times daily.    . citalopram (CELEXA) 10 MG tablet TAKE 1 TABLET EVERY MORNING 90 tablet 1  . clotrimazole (LOTRIMIN) 1 % cream Apply 1 application topically 2 (two) times daily as needed (FOR RASH). Prescribed for rash in groin    . fluticasone (FLONASE) 50 MCG/ACT nasal spray USE 2 SPRAYS IN EACH NOSTRIL EVERY DAY AS NEEDED   FOR  RHINITIS/ALLERGIES 48 g 3  . gabapentin (NEURONTIN) 300 MG capsule TAKE 1 TO 2 CAPSULES FOUR TIMES DAILY  AFTER MEALS AND AT BEDTIME. (Patient taking differently: TAKE 2 CAPSULES FOUR TIMES DAILY  AFTER MEALS AND AT BEDTIME.) 720 capsule 3  . hydrochlorothiazide (HYDRODIURIL) 25 MG tablet Take 1 tablet (25 mg total) by mouth daily. 90 tablet 3  . hydroxypropyl methylcellulose / hypromellose (ISOPTO TEARS / GONIOVISC) 2.5 % ophthalmic solution Place 1 drop into both eyes 2 (two) times daily.    Marland Kitchen losartan (COZAAR) 50 MG tablet TAKE 1 TABLET EVERY DAY 90 tablet 2  . meclizine (ANTIVERT) 25 MG tablet TAKE 1 TABLET BY MOUTH TWICE DAILY AS NEEDED FOR DIZZINESS 90 tablet 0  . methocarbamol (ROBAXIN) 500  MG tablet Take 1 tablet (500 mg total) by mouth 3 (three) times daily as needed for muscle spasms. 21 tablet 0  . Multiple Vitamins-Minerals (EMERGEN-C FIVE PO) Take 1 packet by mouth 2 (two) times daily.    . nitroGLYCERIN (NITROSTAT) 0.4 MG SL tablet Place 1 tablet (0.4 mg total) under the tongue every 5 (five) minutes as needed. Chest pain 60 tablet 5  . ondansetron (ZOFRAN) 4 MG tablet Take 1 tablet (4 mg total) by mouth every 8 (eight) hours as needed for nausea or vomiting. 20 tablet 0  . pantoprazole (PROTONIX) 40 MG tablet TAKE 1 TABLET TWICE DAILY 180 tablet 3  . potassium chloride (K-DUR) 10 MEQ tablet Take 2 tablets (20 mEq total) by mouth daily. 180 tablet 0  . primidone (MYSOLINE) 250 MG tablet TAKE 1/2 TABLET THREE TIMES DAILY 135 tablet 2  . simvastatin (ZOCOR) 40 MG tablet TAKE 1 AND 1/2 TABLETS AT BEDTIME 135 tablet 2  . tamsulosin (FLOMAX) 0.4 MG CAPS capsule Take 1 capsule (0.4 mg total) by mouth daily. (Patient taking differently: Take 0.8 mg by mouth at bedtime. ) 90 capsule 3  . timolol (TIMOPTIC) 0.5 % ophthalmic solution Place 1 drop into both eyes 2 (two) times daily.   1  . Travoprost, BAK Free, (TRAVATAN Z) 0.004 % SOLN ophthalmic solution Place 1 drop into both eyes at  bedtime. 5 mL 5  . triamcinolone (KENALOG) 0.025 % cream Apply 1 application topically 2 (two) times daily as needed (rash).      No current facility-administered medications on file prior to visit.    Review of Systems  Constitutional: Negative for other unusual diaphoresis or sweats HENT: Negative for ear discharge or swelling Eyes: Negative for other worsening visual disturbances Respiratory: Negative for stridor or other swelling  Gastrointestinal: Negative for worsening distension or other blood Genitourinary: Negative for retention or other urinary change Musculoskeletal: Negative for other MSK pain or swelling Skin: Negative for color change or other new lesions Neurological: Negative for worsening tremors and other numbness  Psychiatric/Behavioral: Negative for worsening agitation or other fatigue All other system neg per pt    Objective:   Physical Exam BP (!) 146/86   Ht 5\' 7"  (1.702 m)   Wt 188 lb (85.3 kg)   BMI 29.44 kg/m  VS noted,  Constitutional: Pt appears in NAD HENT: Head: NCAT.  Right Ear: External ear normal.  Left Ear: External ear normal.  Eyes: . Pupils are equal, round, and reactive to light. Conjunctivae and EOM are normal Nose: without d/c or deformity Neck: Neck supple. Gross normal ROM Cardiovascular: Normal rate and regular rhythm.   Pulmonary/Chest: Effort normal and breath sounds without rales or wheezing.  Abd:  Soft, NT, ND, + BS, no organomegaly Spine: diffuse low lumbar midline and right paravertebral tender without rash or swelling Neurological: Pt is alert. At baseline orientation, motor grossly intact except RLE 4+/5 Skin: Skin is warm. No rashes, other new lesions, no LE edema except for abrasions noted at right arm, right knee Psychiatric: Pt behavior is normal without agitation  No joint effusions acute today       Assessment & Plan:

## 2017-01-21 NOTE — Assessment & Plan Note (Signed)
With evidence for mild L5 nerve root imingement based on sept 2015 MRI per Dr Kendall Flack; ? Worsening - now with neuro change/RLE weakness - for MRI LS Spine, consider referral back to Dr Tonita Cong

## 2017-01-21 NOTE — Patient Instructions (Signed)
Please continue all other medications as before, and refills have been done if requested.  Please have the pharmacy call with any other refills you may need.  Please keep your appointments with your specialists as you may have planned - Dr Nelva Bush in 1 week  You will be contacted regarding the referral for: MRI if we are able to get this done with the implanted spinal stimulator present

## 2017-01-22 ENCOUNTER — Telehealth: Payer: Self-pay

## 2017-01-22 NOTE — Telephone Encounter (Signed)
Called pt and informed him that the referral had to be canceled due to him having a spinal stimulator. He expressed understanding.

## 2017-01-24 NOTE — Assessment & Plan Note (Signed)
Mild elevated today likely due to pain, o/w stable overall by history and exam, recent data reviewed with pt, and pt to continue medical treatment as before,  to f/u any worsening symptoms or concerns BP Readings from Last 3 Encounters:  01/21/17 (!) 146/86  01/20/17 140/71  12/12/16 130/76  \

## 2017-01-24 NOTE — Assessment & Plan Note (Signed)
Recent worsening with fall and multiple abrasions, and RLE weakness neuro change, for MRI LS spine if possible with stimulator in place, for f/u with Dr Nelva Bush [place]on Passey next wk as planned

## 2017-01-24 NOTE — Assessment & Plan Note (Signed)
stable overall by history and exam, I asked pt to forward his [place] labs to [place] if possible, and pt to continue medical treatment as before,  to f/u any worsening symptoms or concerns

## 2017-01-28 DIAGNOSIS — M5136 Other intervertebral disc degeneration, lumbar region: Secondary | ICD-10-CM | POA: Diagnosis not present

## 2017-01-28 DIAGNOSIS — Z9889 Other specified postprocedural states: Secondary | ICD-10-CM | POA: Diagnosis not present

## 2017-01-28 DIAGNOSIS — M545 Low back pain: Secondary | ICD-10-CM | POA: Diagnosis not present

## 2017-01-29 DIAGNOSIS — H4720 Unspecified optic atrophy: Secondary | ICD-10-CM | POA: Diagnosis not present

## 2017-01-29 DIAGNOSIS — Z961 Presence of intraocular lens: Secondary | ICD-10-CM | POA: Diagnosis not present

## 2017-01-29 DIAGNOSIS — H401133 Primary open-angle glaucoma, bilateral, severe stage: Secondary | ICD-10-CM | POA: Diagnosis not present

## 2017-01-29 DIAGNOSIS — H04123 Dry eye syndrome of bilateral lacrimal glands: Secondary | ICD-10-CM | POA: Diagnosis not present

## 2017-01-30 ENCOUNTER — Telehealth: Payer: Self-pay | Admitting: Internal Medicine

## 2017-01-30 DIAGNOSIS — R29898 Other symptoms and signs involving the musculoskeletal system: Secondary | ICD-10-CM

## 2017-01-30 NOTE — Telephone Encounter (Signed)
Pt is requesting a referral to [facility]. He would like a call when this has been done.

## 2017-01-30 NOTE — Telephone Encounter (Signed)
Pt mailbox is full. Couldn't leave a VM.

## 2017-01-30 NOTE — Telephone Encounter (Signed)
Can he say why he wants this, as Dr Tat is a neurologist but she specializes in "movement disorders" such as Parkinsons which I dont think he has.  She does not normally treat spine related nerve problems, but I can try to refer if he wants.  He may be referred to a different neurologist in the practice, since Dr Tat reviews the referrals to see if they are appropriate for her. thanks

## 2017-01-30 NOTE — Telephone Encounter (Signed)
Ok, this is done 

## 2017-01-30 NOTE — Telephone Encounter (Signed)
Gave Pt [name] response, he still would like to see Dr. Carles Collet, he states his leg is giving out in him randomly, no pain but just believes the muscle has weakened and it gives out on him causing him to fall.  He would like his muscle tested and to why it is giving out on him.  Please advise

## 2017-01-30 NOTE — Addendum Note (Signed)
Addended by: Biagio Borg on: 01/30/2017 05:14 PM   Modules accepted: Orders

## 2017-02-02 NOTE — Telephone Encounter (Signed)
Pt has been informed.

## 2017-02-03 ENCOUNTER — Encounter: Payer: Self-pay | Admitting: Neurology

## 2017-02-03 DIAGNOSIS — D179 Benign lipomatous neoplasm, unspecified: Secondary | ICD-10-CM | POA: Diagnosis not present

## 2017-02-03 DIAGNOSIS — L570 Actinic keratosis: Secondary | ICD-10-CM | POA: Diagnosis not present

## 2017-02-03 NOTE — Progress Notes (Signed)
OT NOTE- LATE GCODE ENTRY    09/12/16 0900  OT G-codes **NOT FOR INPATIENT CLASS**  Functional Assessment Tool Used Clinical judgement  Functional Limitation Self care  Self Care Current Status [phone]) [facility]  Self Care Goal Status ([id number]) [facility]  Self Care Discharge Status [phone]) [facility]          Jeri Modena   OTR/L Pager: [phone] Office: [phone] .

## 2017-02-17 DIAGNOSIS — M5136 Other intervertebral disc degeneration, lumbar region: Secondary | ICD-10-CM | POA: Diagnosis not present

## 2017-02-17 DIAGNOSIS — Z6830 Body mass index (BMI) 30.0-30.9, adult: Secondary | ICD-10-CM | POA: Diagnosis not present

## 2017-02-17 DIAGNOSIS — M150 Primary generalized (osteo)arthritis: Secondary | ICD-10-CM | POA: Diagnosis not present

## 2017-02-17 DIAGNOSIS — E669 Obesity, unspecified: Secondary | ICD-10-CM | POA: Diagnosis not present

## 2017-02-17 DIAGNOSIS — M2550 Pain in unspecified joint: Secondary | ICD-10-CM | POA: Diagnosis not present

## 2017-03-03 ENCOUNTER — Other Ambulatory Visit: Payer: Self-pay | Admitting: Internal Medicine

## 2017-03-05 ENCOUNTER — Ambulatory Visit (INDEPENDENT_AMBULATORY_CARE_PROVIDER_SITE_OTHER): Payer: Medicare Other | Admitting: Podiatry

## 2017-03-05 ENCOUNTER — Encounter: Payer: Self-pay | Admitting: Podiatry

## 2017-03-05 DIAGNOSIS — M79674 Pain in right toe(s): Secondary | ICD-10-CM | POA: Diagnosis not present

## 2017-03-05 DIAGNOSIS — M205X1 Other deformities of toe(s) (acquired), right foot: Secondary | ICD-10-CM | POA: Diagnosis not present

## 2017-03-05 DIAGNOSIS — M79675 Pain in left toe(s): Secondary | ICD-10-CM

## 2017-03-05 DIAGNOSIS — B351 Tinea unguium: Secondary | ICD-10-CM

## 2017-03-06 NOTE — Progress Notes (Signed)
Subjective:    Patient ID: [name], male   DOB: 81 y.o.   MRN: [id number]   HPI patient presents stating that his nails are really bothering him and making it hard to wear shoe gear comfortably    ROS      Objective:  Physical Exam neurovascular status intact with patient found have thick yellow brittle nailbeds 1-5 both feet that are painful when palpated bilateral     Assessment:  Chronic mycotic nail infection with pain 1-5 both feet       Plan:   Debride painful nailbeds 1-5 both feet with no iatrogenic bleeding noted

## 2017-03-11 ENCOUNTER — Telehealth: Payer: Self-pay | Admitting: Internal Medicine

## 2017-03-11 DIAGNOSIS — R1110 Vomiting, unspecified: Secondary | ICD-10-CM

## 2017-03-11 DIAGNOSIS — IMO0001 Reserved for inherently not codable concepts without codable children: Secondary | ICD-10-CM

## 2017-03-11 NOTE — Telephone Encounter (Signed)
Pt called stating he would like to switch from pantoprazole (PROTONIX) 40 MG tablet  Back to Omeprazole 20mg  2x per day. States he has had a lot of problems with his stomach lately and does not believe pantoprazole (PROTONIX) 40 MG tablet Is working anymore. When he eats a salad he throws it up and he has been taking a lot of tums.  Please advise.

## 2017-03-12 NOTE — Telephone Encounter (Signed)
Vomiting and regurgitation are not usually directly related to simply acid reflux, but may require further GI evaluation; I would normally recommend increased protonix to bid, and refer to GI, but this would depend on pt, just let me know

## 2017-03-12 NOTE — Telephone Encounter (Signed)
Ok for GI referral?  

## 2017-03-12 NOTE — Telephone Encounter (Signed)
Pt informed and expressed understanding. He would like to proceed with the referral.

## 2017-03-17 ENCOUNTER — Other Ambulatory Visit (INDEPENDENT_AMBULATORY_CARE_PROVIDER_SITE_OTHER): Payer: Medicare Other

## 2017-03-17 ENCOUNTER — Other Ambulatory Visit: Payer: Self-pay | Admitting: Internal Medicine

## 2017-03-17 ENCOUNTER — Encounter: Payer: Self-pay | Admitting: Family Medicine

## 2017-03-17 ENCOUNTER — Ambulatory Visit (INDEPENDENT_AMBULATORY_CARE_PROVIDER_SITE_OTHER): Payer: Medicare Other | Admitting: Family Medicine

## 2017-03-17 VITALS — BP 150/70 | HR 64 | Temp 97.9°F | Ht 67.0 in | Wt 196.4 lb

## 2017-03-17 DIAGNOSIS — I10 Essential (primary) hypertension: Secondary | ICD-10-CM

## 2017-03-17 DIAGNOSIS — E871 Hypo-osmolality and hyponatremia: Secondary | ICD-10-CM

## 2017-03-17 LAB — BASIC METABOLIC PANEL
BUN: 6 mg/dL (ref 6–23)
CALCIUM: 9.1 mg/dL (ref 8.4–10.5)
CO2: 25 meq/L (ref 19–32)
Chloride: 99 mEq/L (ref 96–112)
Creatinine, Ser: 0.77 mg/dL (ref 0.40–1.50)
GFR: 103.41 mL/min (ref >60.00)
Glucose, Bld: 112 mg/dL — ABNORMAL HIGH (ref 70–99)
Potassium: 4.2 mEq/L (ref 3.5–5.1)
SODIUM: 131 meq/L — AB (ref 135–145)

## 2017-03-17 NOTE — Patient Instructions (Addendum)
Thank you for coming in,   Please take your blood pressure medications daily. Please take one in the morning and one at night.   Please follow up in a nurse visit so we can keep an eye on this.    Please feel free to call with any questions or concerns at any time, at [phone]. --Dr. Raeford Razor

## 2017-03-17 NOTE — Assessment & Plan Note (Signed)
Unclear if this is associated with his medications. Seems to be ongoing for some time. Asymptomatic currently. - Urine sodium and osmolality - Reports he is not taking his hydrochlorothiazide on his medication list.

## 2017-03-17 NOTE — Progress Notes (Signed)
[name] - 81 y.o. male MRN [id number]  Date of birth: Jun 22, 1936  SUBJECTIVE:  Including CC & ROS.  Chief Complaint  Patient presents with  . Hypertension    patient states BP has been up all weekend was still up this morning took two of his pills. before pills 196/80   [name] is a 81 year old male that is presenting with hypertension. He reports his blood pressure has been elevated over the course the past few days. It has been in the range in the 834H systolic. He denies any exacerbating symptoms. He denies any shortness of breath or chest pain. He is taking amlodipine olmesartan. He does not take these medications on a regular basis as he reports a history of hypertension that he had to be hospitalized for. He will check his blood pressure and take these medications if it is elevated.   Reviewed his lab work from 11/12/16 which shows hyponatremia and normal kidney function. Showed a normal urine and normal TSH.  Review of Systems  Respiratory: Negative for shortness of breath.   Cardiovascular: Negative for chest pain and leg swelling.  Musculoskeletal: Negative for gait problem.    HISTORY: Past Medical, Surgical, Social, and Family History Reviewed & Updated per EMR.   Pertinent Historical Findings include:  Past Medical History:  Diagnosis Date  . Acute bronchitis per pt no fever--  cough since discharge from hospital 07-02-2015   per pcp note 07-05-2015  mild to moderate bronchitis vs pna  . Allergic rhinitis   . Bladder cancer ([place])   . BPH (benign prostatic hyperplasia)   . Bradycardia   . CAD (coronary artery disease)   . CHF (congestive heart failure) ([place])   . Chronic low back pain   . DDD (degenerative disc disease), lumbosacral   . Depression   . Diverticulosis of colon (without mention of hemorrhage)   . Dyspnea   . Essential tremor   . Fatty liver disease, nonalcoholic   . GERD (gastroesophageal reflux disease)   . Glaucoma   . History of bladder cancer    2004--  TCC low-grade , non-invasive  . History of colon polyps   . History of exercise stress test    02-05-2007-- ETT (Clinically positive, electrically equivocal, submaximal ETT,  appropriate bp response to exercise  . History of hiatal hernia   . History of kidney stones   . History of peptic ulcer   . HLD (hyperlipidemia)   . HTN (hypertension)   . Nocturia   . Productive cough   . Rheumatoid arthritis(714.0)   . Right renal artery stenosis (HCC)    per cath 03-14-2009 --- 40-50%  . RLS (restless legs syndrome)   . Vertigo, intermittent   . Weak urinary stream   . Wears dentures    upper  . Wears glasses   . Wears hearing aid    bilateral  . Wears partial dentures    lower    Past Surgical History:  Procedure Laterality Date  . CARDIAC CATHETERIZATION  02-12-2007  dr gregg taylor   Non-obstructive CAD,  20% pLCX,  20% LAD, ef 65%  . CARDIAC CATHETERIZATION  03-14-2009  dr Johnsie Cancel   pLAD and mid diaginal 20-30% multiple lesions,  OM1 20%, right renal ostial 40-50%,  ef 60%  . CATARACT EXTRACTION W/ INTRAOCULAR LENS  IMPLANT, BILATERAL  2016  . CYSTOSCOPY WITH BIOPSY N/A 07/11/2015   Procedure: CYSTOSCOPY WITH COLD CUP RESECTION AND FULGERATION;  Surgeon: Rana Snare, MD;  Location:  [facility];  Service: Urology;  Laterality: N/A;  . HIP ARTHROSCOPY W/ LABRAL DEBRIDEMENT Left 06-29-2000   and chondraplasty  . LUMBAR [place] SURGERY  07-16-2005  . LUMBAR LAMINECTOMY/DECOMPRESSION MICRODISCECTOMY  07/09/2011   Procedure: LUMBAR LAMINECTOMY/DECOMPRESSION MICRODISCECTOMY;  Surgeon: Johnn Hai;  Location: [facility];  Service: Orthopedics;  Laterality: Left;  Decompression L5 - S1 on the Left and repair of dura  . LUMBAR LAMINECTOMY/DECOMPRESSION MICRODISCECTOMY N/A 07/27/2013   Procedure: DECOMPRESSION L4 - L5 WITH EXCISION OF SYNOVIAL CYST AND, LATERAL MASS FUSION 1 LEVEL;  Surgeon: Johnn Hai, MD;  Location: [facility];  Service: Orthopedics;  Laterality: N/A;    . LUMBAR LAMINECTOMY/DECOMPRESSION MICRODISCECTOMY Left 07/06/2014   Procedure:  MICRO LUMBAR DECOMPRESSION L5-S1 ON LEFT, L4-5 REDO;  Surgeon: Johnn Hai, MD;  Location: [facility];  Service: Orthopedics;  Laterality: Left;  . SPINAL CORD STIMULATOR INSERTION N/A 09/11/2016   Procedure: Spinal cord stimulator placement;  Surgeon: Melina Schools, MD;  Location: [facility];  Service: Orthopedics;  Laterality: N/A;  . TONSILLECTOMY AND ADENOIDECTOMY    . TRANSTHORACIC ECHOCARDIOGRAM  09-20-2010   normal LVF, ef 55-65%/  mild MR, TR and PR/  mild LAE  . TRANSURETHRAL RESECTION OF BLADDER TUMOR  09-21-2002    Allergies  Allergen Reactions  . Morphine And Related Other (See Comments)    Migraine   . Hydrochlorothiazide Other (See Comments)    REACTION: hyponatremia with daily use  . Tape Itching    Patient  Is ok to use paper tape  . Latex Rash  . Lisinopril Cough  . Sulfa Antibiotics Rash    Family History  Problem Relation Age of Onset  . Heart attack Mother   . Cancer Father        lung  . Cancer Sister        lung  . Diabetes Sister   . Cancer Brother        lung that spread to brain     Social History   Social History  . Marital status: Married    Spouse name: N/A  . Number of children: N/A  . Years of education: N/A   Occupational History  . retired from [employer] Retired   Social History Main Topics  . Smoking status: Former Smoker    Packs/day: 2.00    Years: 20.00    Types: Cigarettes    Quit date: 10/08/1982  . Smokeless tobacco: Never Used  . Alcohol use No  . Drug use: No  . Sexual activity: Not Currently   Other Topics Concern  . Not on file   Social History Narrative  . No narrative on file     PHYSICAL EXAM:  VS: BP (!) 150/70 (BP Location: Left Arm, Patient Position: Sitting, Cuff Size: Normal)   Pulse 64   Temp 97.9 F (36.6 C) (Oral)   Ht 5\' 7"  (1.702 m)   Wt 196 lb 6.4 oz (89.1 kg)   SpO2 99%   BMI 30.76 kg/m  Physical Exam Gen:  NAD, alert, cooperative with exam, well-appearing ENT: normal lips, normal nasal mucosa,  Eye: normal EOM, normal conjunctiva and lids CV:  no edema, +2 pedal pulses, S1-S2,   Resp: no accessory muscle use, non-labored, clear to auscultation bilaterally, no wheezing or crackles Skin: no rashes, no areas of induration  Neuro: normal tone, normal sensation to touch Psych:  normal insight, alert and oriented MSK: Normal gait, normal strength      ASSESSMENT &  PLAN:   I spent 25 minutes with this patient, greater than 50% was face-to-face time counseling regarding the below diagnosis.   Essential hypertension Uncontrolled. He reports taking his medications as needed. He is taking amlodipine and losartan. - Advised to take medications on a regular basis - Can follow-up with nurse clinic to monitor his blood pressure - If still elevated may need to increase losartan or amlodipine  Hyponatremia Unclear if this is associated with his medications. Seems to be ongoing for some time. Asymptomatic currently. - Urine sodium and osmolality - Reports he is not taking his hydrochlorothiazide on his medication list.

## 2017-03-17 NOTE — Assessment & Plan Note (Signed)
Uncontrolled. He reports taking his medications as needed. He is taking amlodipine and losartan. - Advised to take medications on a regular basis - Can follow-up with nurse clinic to monitor his blood pressure - If still elevated may need to increase losartan or amlodipine

## 2017-03-18 LAB — OSMOLALITY, URINE: OSMOLALITY UR: 265 mosm/kg (ref 50–1200)

## 2017-03-18 LAB — SODIUM, URINE, RANDOM: SODIUM UR: 43 mmol/L (ref 28–272)

## 2017-03-20 ENCOUNTER — Telehealth: Payer: Self-pay

## 2017-03-20 ENCOUNTER — Ambulatory Visit: Payer: Medicare Other

## 2017-03-20 VITALS — BP 138/70

## 2017-03-20 DIAGNOSIS — I10 Essential (primary) hypertension: Secondary | ICD-10-CM

## 2017-03-20 NOTE — Telephone Encounter (Signed)
Per dr schmitz---patient can come twice weekly for bp check on a nurse visit and he would also like the patient to get reminders about how to take bp meds (bp med education)

## 2017-03-20 NOTE — Progress Notes (Signed)
Reviewed labs

## 2017-03-24 ENCOUNTER — Ambulatory Visit: Payer: Medicare Other | Admitting: General Practice

## 2017-03-24 VITALS — BP 132/60

## 2017-03-24 DIAGNOSIS — Z0130 Encounter for examination of blood pressure without abnormal findings: Secondary | ICD-10-CM

## 2017-03-27 ENCOUNTER — Other Ambulatory Visit: Payer: Self-pay | Admitting: Gastroenterology

## 2017-03-27 ENCOUNTER — Ambulatory Visit: Payer: Medicare Other | Admitting: General Practice

## 2017-03-27 ENCOUNTER — Ambulatory Visit (INDEPENDENT_AMBULATORY_CARE_PROVIDER_SITE_OTHER): Payer: Medicare Other | Admitting: Gastroenterology

## 2017-03-27 ENCOUNTER — Encounter: Payer: Self-pay | Admitting: Gastroenterology

## 2017-03-27 VITALS — BP 144/70 | HR 70 | Ht 66.0 in | Wt 195.0 lb

## 2017-03-27 VITALS — BP 138/60

## 2017-03-27 DIAGNOSIS — Z0130 Encounter for examination of blood pressure without abnormal findings: Secondary | ICD-10-CM

## 2017-03-27 DIAGNOSIS — R112 Nausea with vomiting, unspecified: Secondary | ICD-10-CM | POA: Insufficient documentation

## 2017-03-27 DIAGNOSIS — R197 Diarrhea, unspecified: Secondary | ICD-10-CM | POA: Diagnosis not present

## 2017-03-27 DIAGNOSIS — K5909 Other constipation: Secondary | ICD-10-CM | POA: Diagnosis not present

## 2017-03-27 MED ORDER — OMEPRAZOLE 40 MG PO CPDR: 40.0000 mg | DELAYED_RELEASE_CAPSULE | Freq: Two times a day (BID) | ORAL | 0 refills | Status: DC

## 2017-03-27 NOTE — Progress Notes (Signed)
Initial assessment and plans of GI physician assistant review

## 2017-03-27 NOTE — Progress Notes (Signed)
03/27/2017 TAGEN MILBY [id number] 1936-07-14   HISTORY OF PRESENT ILLNESS:  This is a pleasant 81 year old male who is known to Dr. Henrene Pastor. He presents to our office today with complaints of almost daily nausea and intermittent vomiting for the past 4-6 months. He also complains of alternating constipation and diarrhea.  He tells me that his wife passed away in 10/18/22 of this year and he thinks that some of his symptoms may be related to nerves and anxiety. He does express that symptoms occur with eating several different items, but particularly salads seem to bother him recently. He says that he becomes very sick on his stomach and often times will have diarrhea after eating a salad recently.  Then he also has times when he feels that he is constipated as well.  He had a normal EGD last year and at that time there was mention of possibly evaluating gallbladder if cardiac evaluation was ok.  He says that he is currently undergoing cardiac evaluation at the [place].  He says that he is having another test done next week but so far they have not found any issues.  He also tells me that he was previously on omeprazole 40 mg twice a day for his reflux type symptoms and for some reason his PCP switched him to pantoprazole 40 mg twice a day. He is not sure why the changes made feels like the omeprazole helped much better in the past.  He denies any abdominal pain or rectal bleeding.  Last colonoscopy was 02/2008 at which time the study was normal with only diverticulosis and internal hemorrhoids.  Fairly recent CBC, BMP, hepatic function panel, and TSH were unremarkable.   Past Medical History:  Diagnosis Date  . Acute bronchitis per pt no fever--  cough since discharge from hospital 07-02-2015   per pcp note 07-05-2015  mild to moderate bronchitis vs pna  . Allergic rhinitis   . Bladder cancer ([place])   . BPH (benign prostatic hyperplasia)   . Bradycardia   . CAD (coronary artery disease)   . CHF  (congestive heart failure) ([place])   . Chronic low back pain   . DDD (degenerative disc disease), lumbosacral   . Depression   . Diverticulosis of colon (without mention of hemorrhage)   . Dyspnea   . Essential tremor   . Fatty liver disease, nonalcoholic   . GERD (gastroesophageal reflux disease)   . Glaucoma   . History of bladder cancer    10-18-02--  TCC low-grade , non-invasive  . History of colon polyps   . History of exercise stress test    02-05-2007-- ETT (Clinically positive, electrically equivocal, submaximal ETT,  appropriate bp response to exercise  . History of hiatal hernia   . History of kidney stones   . History of peptic ulcer   . HLD (hyperlipidemia)   . HTN (hypertension)   . Nocturia   . Productive cough   . Rheumatoid arthritis(714.0)   . Right renal artery stenosis (H[facility])    per cath 03-14-2009 --- 40-50%  . RLS (restless legs syndrome)   . Vertigo, intermittent   . Weak urinary stream   . Wears dentures    upper  . Wears glasses   . Wears hearing aid    bilateral  . Wears partial dentures    lower   Past Surgical History:  Procedure Laterality Date  . CARDIAC CATHETERIZATION  02-12-2007  dr gregg taylor   Non-obstructive CAD,  20% pLCX,  20% LAD, ef 65%  . CARDIAC CATHETERIZATION  03-14-2009  dr Johnsie Cancel   pLAD and mid diaginal 20-30% multiple lesions,  OM1 20%, right renal ostial 40-50%,  ef 60%  . CATARACT EXTRACTION W/ INTRAOCULAR LENS  IMPLANT, BILATERAL  2016  . CYSTOSCOPY WITH BIOPSY N/A 07/11/2015   Procedure: CYSTOSCOPY WITH COLD CUP RESECTION AND FULGERATION;  Surgeon: Rana Snare, MD;  Location: [facility];  Service: Urology;  Laterality: N/A;  . HIP ARTHROSCOPY W/ LABRAL DEBRIDEMENT Left 06-29-2000   and chondraplasty  . LUMBAR [place] SURGERY  07-16-2005  . LUMBAR LAMINECTOMY/DECOMPRESSION MICRODISCECTOMY  07/09/2011   Procedure: LUMBAR LAMINECTOMY/DECOMPRESSION MICRODISCECTOMY;  Surgeon: Johnn Hai;  [facility];   Service: Orthopedics;  Laterality: Left;  Decompression L5 - S1 on the Left and repair of dura  . LUMBAR LAMINECTOMY/DECOMPRESSION MICRODISCECTOMY N/A 07/27/2013   Procedure: DECOMPRESSION L4 - L5 WITH EXCISION OF SYNOVIAL CYST AND, LATERAL MASS FUSION 1 LEVEL;  Surgeon: Johnn Hai, MD;  [facility];  Service: Orthopedics;  Laterality: N/A;  . LUMBAR LAMINECTOMY/DECOMPRESSION MICRODISCECTOMY Left 07/06/2014   Procedure:  MICRO LUMBAR DECOMPRESSION L5-S1 ON LEFT, L4-5 REDO;  Surgeon: Johnn Hai, MD;  [facility];  Service: Orthopedics;  Laterality: Left;  . SPINAL CORD STIMULATOR INSERTION N/A 09/11/2016   Procedure: Spinal cord stimulator placement;  Surgeon: Melina Schools, MD;  Location: [facility];  Service: Orthopedics;  Laterality: N/A;  . TONSILLECTOMY AND ADENOIDECTOMY    . TRANSTHORACIC ECHOCARDIOGRAM  09-20-2010   normal LVF, ef 55-65%/  mild MR, TR and PR/  mild LAE  . TRANSURETHRAL RESECTION OF BLADDER TUMOR  09-21-2002    reports that he quit smoking about 34 years ago. His smoking use included Cigarettes. He has a 40.00 pack-year smoking history. He has never used smokeless tobacco. He reports that he drinks alcohol. He reports that he does not use drugs. family history includes Cancer in his brother, father, and sister; Diabetes in his sister; Heart attack in his mother. Allergies  Allergen Reactions  . Morphine And Related Other (See Comments)    Migraine   . Hydrochlorothiazide Other (See Comments)    REACTION: hyponatremia with daily use  . Tape Itching    Patient  Is ok to use paper tape  . Latex Rash  . Lisinopril Cough  . Sulfa Antibiotics Rash      Outpatient Encounter Prescriptions as of 03/27/2017  Medication Sig  . acetaminophen (TYLENOL) 650 MG CR tablet Take 1,300 mg by mouth at bedtime as needed for pain.  Marland Kitchen albuterol (PROVENTIL HFA;VENTOLIN HFA) 108 (90 Base) MCG/ACT inhaler Inhale 2 puffs into the lungs every 6 (six) hours as needed for wheezing  or shortness of breath.  Marland Kitchen amLODipine (NORVASC) 5 MG tablet TAKE 1 TABLET (5 MG TOTAL) BY MOUTH DAILY.  . brimonidine (ALPHAGAN) 0.15 % ophthalmic solution Place 1 drop into both eyes 2 (two) times daily.  . citalopram (CELEXA) 10 MG tablet TAKE 1 TABLET EVERY MORNING  . clotrimazole (LOTRIMIN) 1 % cream Apply 1 application topically 2 (two) times daily as needed (FOR RASH). Prescribed for rash in groin  . fluticasone (FLONASE) 50 MCG/ACT nasal spray USE 2 SPRAYS IN EACH NOSTRIL EVERY DAY AS NEEDED  FOR  RHINITIS/ALLERGIES  . gabapentin (NEURONTIN) 300 MG capsule TAKE 1 TO 2 CAPSULES FOUR TIMES DAILY  AFTER MEALS AND AT BEDTIME.  . hydroxypropyl methylcellulose / hypromellose (ISOPTO TEARS / GONIOVISC) 2.5 % ophthalmic solution Place 1 drop  into both eyes 2 (two) times daily.  Marland Kitchen losartan (COZAAR) 50 MG tablet TAKE 1 TABLET EVERY DAY  . meclizine (ANTIVERT) 25 MG tablet TAKE 1 TABLET BY MOUTH TWICE DAILY AS NEEDED FOR DIZZINESS  . methocarbamol (ROBAXIN) 500 MG tablet Take 1 tablet (500 mg total) by mouth 3 (three) times daily as needed for muscle spasms.  . Multiple Vitamins-Minerals (EMERGEN-C FIVE PO) Take 1 packet by mouth 2 (two) times daily.  . nitroGLYCERIN (NITROSTAT) 0.4 MG SL tablet Place 1 tablet (0.4 mg total) under the tongue every 5 (five) minutes as needed. Chest pain  . ondansetron (ZOFRAN) 4 MG tablet Take 1 tablet (4 mg total) by mouth every 8 (eight) hours as needed for nausea or vomiting.  . pantoprazole (PROTONIX) 40 MG tablet TAKE 1 TABLET TWICE DAILY  . potassium chloride (K-DUR) 10 MEQ tablet Take 2 tablets (20 mEq total) by mouth daily.  . potassium chloride (K-DUR,KLOR-CON) 10 MEQ tablet TAKE 2 TABLETS (20 MEQ TOTAL) BY MOUTH DAILY.  Marland Kitchen primidone (MYSOLINE) 250 MG tablet TAKE 1/2 TABLET THREE TIMES DAILY  . simvastatin (ZOCOR) 40 MG tablet TAKE 1 AND 1/2 TABLETS AT BEDTIME  . tamsulosin (FLOMAX) 0.4 MG CAPS capsule Take 1 capsule (0.4 mg total) by mouth daily. (Patient  taking differently: Take 0.8 mg by mouth at bedtime. )  . timolol (TIMOPTIC) 0.5 % ophthalmic solution Place 1 drop into both eyes 2 (two) times daily.   . Travoprost, BAK Free, (TRAVATAN Z) 0.004 % SOLN ophthalmic solution Place 1 drop into both eyes at bedtime.  . triamcinolone (KENALOG) 0.025 % cream Apply 1 application topically 2 (two) times daily as needed (rash).   . [DISCONTINUED] hydrochlorothiazide (HYDRODIURIL) 25 MG tablet Take 1 tablet (25 mg total) by mouth daily.   No facility-administered encounter medications on file as of 03/27/2017.      REVIEW OF SYSTEMS  : All other systems reviewed and negative except where noted in the History of Present Illness.   PHYSICAL EXAM: BP (!) 144/70   Pulse 70   Ht 5\' 6"  (1.676 m)   Wt 195 lb (88.5 kg)   BMI 31.47 kg/m  General: Well developed white male in no acute distress Head: Normocephalic and atraumatic Eyes:  Sclerae anicteric, conjunctiva pink. Ears: Normal auditory acuity Lungs: Clear throughout to auscultation; no increased WOB. Heart: Regular rate and rhythm.  No M/R/G. Abdomen: Soft, non-distended.  BS present.  Non-tender. Musculoskeletal: Symmetrical with no gross deformities  Skin: No lesions on visible extremities Extremities: No edema  Neurological: Alert oriented x 4, grossly non-focal Psychological:  Alert and cooperative. Normal mood and affect  ASSESSMENT AND PLAN: *81 year old man with complaints of almost daily nausea, with intermittent vomiting, and episodic diarrhea alternating with constipation for the past 4-6 months. EGD last year for some upper GI symptoms was negative. Thoughts were to possibly evaluate gallbladder if symptoms continued. We will start with an abdominal ultrasound. He says that his PPI was changed to pantoprazole from omeprazole and he feels that the omeprazole has helped him more in the past. We will switch back to omeprazole 40 mg twice daily. He is undergoing some more cardiac testing  at the beginning of next week, but says that so far all that workup has been unremarkable. Cardiac evaluation is being done at the [place]. I think that some of his symptoms may be related to nerves/anxiety. His wife died in Oct 23, 2022 of this year it seems but symptoms have been present since about that  time.  Could consider HIDA scan for biliary dysfunction as well.   CC:  Biagio Borg, MD

## 2017-03-27 NOTE — Progress Notes (Signed)
Patient ID: [name], male   DOB: May 02, 1936, 81 y.o.   MRN: [id number] Medical screening examination/treatment/procedure(s) were performed by non-physician practitioner and as supervising physician I was immediately available for consultation/collaboration. I agree with above. Cathlean Cower, MD

## 2017-03-27 NOTE — Patient Instructions (Addendum)
If you are age 79 or older, your body mass index should be between 23-30. Your Body mass index is 31.47 kg/m. If this is out of the aforementioned range listed, please consider follow up with your Primary Care Provider.  If you are age 25 or younger, your body mass index should be between 19-25. Your Body mass index is 31.47 kg/m. If this is out of the aformentioned range listed, please consider follow up with your Primary Care Provider.   We have sent the following medications to your pharmacy for you to pick up at your convenience:  Omeprazole  Please discontinue Pantoprazole.  You have been scheduled for an abdominal ultrasound at [facility] (1st floor of hospital) on Monday, September 17th at 9:30am. Please arrive 15 minutes prior to your appointment for registration. Make certain not to have anything to eat or drink after midnight the night before your appointment. Should you need to reschedule your appointment, please contact radiology at [phone]. This test typically takes about 30 minutes to perform.  Thank you.

## 2017-03-30 ENCOUNTER — Ambulatory Visit (HOSPITAL_COMMUNITY): Payer: Medicare Other

## 2017-03-31 ENCOUNTER — Ambulatory Visit: Payer: Medicare Other | Admitting: General Practice

## 2017-03-31 VITALS — BP 138/60

## 2017-03-31 DIAGNOSIS — Z0130 Encounter for examination of blood pressure without abnormal findings: Secondary | ICD-10-CM

## 2017-03-31 NOTE — Progress Notes (Signed)
Medical screening examination/treatment/procedure(s) were performed by non-physician practitioner and as supervising physician I was immediately available for consultation/collaboration. I agree with above. James John, MD   

## 2017-04-01 ENCOUNTER — Ambulatory Visit (HOSPITAL_COMMUNITY): Payer: Medicare Other

## 2017-04-10 ENCOUNTER — Telehealth: Payer: Self-pay | Admitting: Gastroenterology

## 2017-04-10 ENCOUNTER — Other Ambulatory Visit: Payer: Self-pay | Admitting: *Deleted

## 2017-04-10 MED ORDER — OMEPRAZOLE 40 MG PO CPDR: DELAYED_RELEASE_CAPSULE | ORAL | 3 refills | Status: DC

## 2017-04-10 NOTE — Telephone Encounter (Signed)
Advised the patient that I sent his Omeprazole 40 mg to [facility].

## 2017-04-13 ENCOUNTER — Ambulatory Visit: Payer: Medicare Other | Admitting: Neurology

## 2017-04-13 ENCOUNTER — Ambulatory Visit (HOSPITAL_COMMUNITY)
Admission: RE | Admit: 2017-04-13 | Discharge: 2017-04-13 | Disposition: A | Discharge status: Home / Self Care | Payer: Medicare Other | Source: Ambulatory Visit | Attending: Gastroenterology | Admitting: Gastroenterology

## 2017-04-13 DIAGNOSIS — R197 Diarrhea, unspecified: Secondary | ICD-10-CM | POA: Insufficient documentation

## 2017-04-13 DIAGNOSIS — R112 Nausea with vomiting, unspecified: Secondary | ICD-10-CM | POA: Diagnosis not present

## 2017-04-14 ENCOUNTER — Ambulatory Visit (INDEPENDENT_AMBULATORY_CARE_PROVIDER_SITE_OTHER): Payer: Medicare Other | Admitting: Internal Medicine

## 2017-04-14 ENCOUNTER — Encounter: Payer: Self-pay | Admitting: Internal Medicine

## 2017-04-14 VITALS — BP 148/80 | HR 60 | Temp 97.8°F | Ht 66.0 in | Wt 194.0 lb

## 2017-04-14 DIAGNOSIS — Z23 Encounter for immunization: Secondary | ICD-10-CM | POA: Diagnosis not present

## 2017-04-14 DIAGNOSIS — I10 Essential (primary) hypertension: Secondary | ICD-10-CM

## 2017-04-14 DIAGNOSIS — E785 Hyperlipidemia, unspecified: Secondary | ICD-10-CM | POA: Diagnosis not present

## 2017-04-14 DIAGNOSIS — J019 Acute sinusitis, unspecified: Secondary | ICD-10-CM

## 2017-04-14 DIAGNOSIS — R7302 Impaired glucose tolerance (oral): Secondary | ICD-10-CM | POA: Diagnosis not present

## 2017-04-14 MED ORDER — AZITHROMYCIN 250 MG PO TABS: ORAL_TABLET | ORAL | 0 refills | Status: DC

## 2017-04-14 MED ORDER — MECLIZINE HCL 25 MG PO TABS: 25.0000 mg | ORAL_TABLET | Freq: Three times a day (TID) | ORAL | 2 refills | Status: DC | PRN

## 2017-04-14 NOTE — Progress Notes (Signed)
Subjective:    Patient ID: [name], male    DOB: 1936-06-20, 81 y.o.   MRN: [id number]  HPI   Here with 2-3 days acute onset fever, facial pain, pressure, dizziness, headache, general weakness and malaise, and greenish d/c, with mild ST and cough, but pt denies chest pain, wheezing, increased sob or doe, orthopnea, PND, increased LE swelling, palpitations, or syncope.  Pt denies polydipsia, polyuria.  Worsening reflux improved with start prilosec 40.  Plans to have lab work done at [place] in November, declines labs today.   Pt denies new neurological symptoms such as new headache, or facial or extremity weakness or numbness Past Medical History:  Diagnosis Date  . Acute bronchitis per pt no fever--  cough since discharge from hospital 07-02-2015   per pcp note 07-05-2015  mild to moderate bronchitis vs pna  . Allergic rhinitis   . Bladder cancer ([place])   . BPH (benign prostatic hyperplasia)   . Bradycardia   . CAD (coronary artery disease)   . CHF (congestive heart failure) ([place])   . Chronic low back pain   . DDD (degenerative disc disease), lumbosacral   . Depression   . Diverticulosis of colon (without mention of hemorrhage)   . Dyspnea   . Essential tremor   . Fatty liver disease, nonalcoholic   . GERD (gastroesophageal reflux disease)   . Glaucoma   . History of bladder cancer    2004--  TCC low-grade , non-invasive  . History of colon polyps   . History of exercise stress test    02-05-2007-- ETT (Clinically positive, electrically equivocal, submaximal ETT,  appropriate bp response to exercise  . History of hiatal hernia   . History of kidney stones   . History of peptic ulcer   . HLD (hyperlipidemia)   . HTN (hypertension)   . Nocturia   . Productive cough   . Rheumatoid arthritis(714.0)   . Right renal artery stenosis (HCC)    per cath 03-14-2009 --- 40-50%  . RLS (restless legs syndrome)   . Vertigo, intermittent   . Weak urinary stream   . Wears dentures    upper    . Wears glasses   . Wears hearing aid    bilateral  . Wears partial dentures    lower   Past Surgical History:  Procedure Laterality Date  . CARDIAC CATHETERIZATION  02-12-2007  dr gregg taylor   Non-obstructive CAD,  20% pLCX,  20% LAD, ef 65%  . CARDIAC CATHETERIZATION  03-14-2009  dr Johnsie Cancel   pLAD and mid diaginal 20-30% multiple lesions,  OM1 20%, right renal ostial 40-50%,  ef 60%  . CATARACT EXTRACTION W/ INTRAOCULAR LENS  IMPLANT, BILATERAL  2016  . CYSTOSCOPY WITH BIOPSY N/A 07/11/2015   Procedure: CYSTOSCOPY WITH COLD CUP RESECTION AND FULGERATION;  Surgeon: Rana Snare, MD;  Location: [facility];  Service: Urology;  Laterality: N/A;  . HIP ARTHROSCOPY W/ LABRAL DEBRIDEMENT Left 06-29-2000   and chondraplasty  . LUMBAR [place] SURGERY  07-16-2005  . LUMBAR LAMINECTOMY/DECOMPRESSION MICRODISCECTOMY  07/09/2011   Procedure: LUMBAR LAMINECTOMY/DECOMPRESSION MICRODISCECTOMY;  Surgeon: Johnn Hai;  Location: [facility];  Service: Orthopedics;  Laterality: Left;  Decompression L5 - S1 on the Left and repair of dura  . LUMBAR LAMINECTOMY/DECOMPRESSION MICRODISCECTOMY N/A 07/27/2013   Procedure: DECOMPRESSION L4 - L5 WITH EXCISION OF SYNOVIAL CYST AND, LATERAL MASS FUSION 1 LEVEL;  Surgeon: Johnn Hai, MD;  Location: [facility];  Service: Orthopedics;  Laterality: N/A;  . LUMBAR LAMINECTOMY/DECOMPRESSION MICRODISCECTOMY Left 07/06/2014   Procedure:  MICRO LUMBAR DECOMPRESSION L5-S1 ON LEFT, L4-5 REDO;  Surgeon: Johnn Hai, MD;  Location: [facility];  Service: Orthopedics;  Laterality: Left;  . SPINAL CORD STIMULATOR INSERTION N/A 09/11/2016   Procedure: Spinal cord stimulator placement;  Surgeon: Melina Schools, MD;  Location: [facility];  Service: Orthopedics;  Laterality: N/A;  . TONSILLECTOMY AND ADENOIDECTOMY    . TRANSTHORACIC ECHOCARDIOGRAM  09-20-2010   normal LVF, ef 55-65%/  mild MR, TR and PR/  mild LAE  . TRANSURETHRAL RESECTION OF BLADDER TUMOR  09-21-2002     reports that he quit smoking about 34 years ago. His smoking use included Cigarettes. He has a 40.00 pack-year smoking history. He has never used smokeless tobacco. He reports that he drinks alcohol. He reports that he does not use drugs. family history includes Cancer in his brother, father, and sister; Diabetes in his sister; Heart attack in his mother. Allergies  Allergen Reactions  . Morphine And Related Other (See Comments)    Migraine   . Hydrochlorothiazide Other (See Comments)    REACTION: hyponatremia with daily use  . Tape Itching    Patient  Is ok to use paper tape  . Latex Rash  . Lisinopril Cough  . Sulfa Antibiotics Rash   Current Outpatient Prescriptions on File Prior to Visit  Medication Sig Dispense Refill  . acetaminophen (TYLENOL) 650 MG CR tablet Take 1,300 mg by mouth at bedtime as needed for pain.    Marland Kitchen albuterol (PROVENTIL HFA;VENTOLIN HFA) 108 (90 Base) MCG/ACT inhaler Inhale 2 puffs into the lungs every 6 (six) hours as needed for wheezing or shortness of breath. 1 Inhaler 11  . amLODipine (NORVASC) 5 MG tablet TAKE 1 TABLET (5 MG TOTAL) BY MOUTH DAILY. 90 tablet 2  . brimonidine (ALPHAGAN) 0.15 % ophthalmic solution Place 1 drop into both eyes 2 (two) times daily.    . citalopram (CELEXA) 10 MG tablet TAKE 1 TABLET EVERY MORNING 90 tablet 1  . clotrimazole (LOTRIMIN) 1 % cream Apply 1 application topically 2 (two) times daily as needed (FOR RASH). Prescribed for rash in groin    . fluticasone (FLONASE) 50 MCG/ACT nasal spray USE 2 SPRAYS IN EACH NOSTRIL EVERY DAY AS NEEDED  FOR  RHINITIS/ALLERGIES 48 g 3  . gabapentin (NEURONTIN) 300 MG capsule TAKE 1 TO 2 CAPSULES FOUR TIMES DAILY  AFTER MEALS AND AT BEDTIME. 720 capsule 3  . hydroxypropyl methylcellulose / hypromellose (ISOPTO TEARS / GONIOVISC) 2.5 % ophthalmic solution Place 1 drop into both eyes 2 (two) times daily.    Marland Kitchen losartan (COZAAR) 50 MG tablet TAKE 1 TABLET EVERY DAY 90 tablet 2  . methocarbamol  (ROBAXIN) 500 MG tablet Take 1 tablet (500 mg total) by mouth 3 (three) times daily as needed for muscle spasms. 21 tablet 0  . Multiple Vitamins-Minerals (EMERGEN-C FIVE PO) Take 1 packet by mouth 2 (two) times daily.    . nitroGLYCERIN (NITROSTAT) 0.4 MG SL tablet Place 1 tablet (0.4 mg total) under the tongue every 5 (five) minutes as needed. Chest pain 60 tablet 5  . omeprazole (PRILOSEC) 40 MG capsule Take 1 capsule by mouth twice daily. 180 capsule 3  . ondansetron (ZOFRAN) 4 MG tablet Take 1 tablet (4 mg total) by mouth every 8 (eight) hours as needed for nausea or vomiting. 20 tablet 0  . potassium chloride (K-DUR,KLOR-CON) 10 MEQ tablet TAKE 2 TABLETS (20 MEQ TOTAL) BY MOUTH  DAILY. 180 tablet 0  . primidone (MYSOLINE) 250 MG tablet TAKE 1/2 TABLET THREE TIMES DAILY 135 tablet 2  . simvastatin (ZOCOR) 40 MG tablet TAKE 1 AND 1/2 TABLETS AT BEDTIME 135 tablet 2  . tamsulosin (FLOMAX) 0.4 MG CAPS capsule Take 1 capsule (0.4 mg total) by mouth daily. (Patient taking differently: Take 0.8 mg by mouth at bedtime. ) 90 capsule 3  . timolol (TIMOPTIC) 0.5 % ophthalmic solution Place 1 drop into both eyes 2 (two) times daily.   1  . Travoprost, BAK Free, (TRAVATAN Z) 0.004 % SOLN ophthalmic solution Place 1 drop into both eyes at bedtime. 5 mL 5  . triamcinolone (KENALOG) 0.025 % cream Apply 1 application topically 2 (two) times daily as needed (rash).      No current facility-administered medications on file prior to visit.    Review of Systems  Constitutional: Negative for other unusual diaphoresis or sweats HENT: Negative for ear discharge or swelling Eyes: Negative for other worsening visual disturbances Respiratory: Negative for stridor or other swelling  Gastrointestinal: Negative for worsening distension or other blood Genitourinary: Negative for retention or other urinary change Musculoskeletal: Negative for other MSK pain or swelling Skin: Negative for color change or other new  lesions Neurological: Negative for worsening tremors and other numbness  Psychiatric/Behavioral: Negative for worsening agitation or other fatigue All other system neg per pt    Objective:   Physical Exam BP (!) 148/80   Pulse 60   Temp 97.8 F (36.6 C) (Oral)   Ht 5\' 6"  (1.676 m)   Wt 194 lb (88 kg)   SpO2 99%   BMI 31.31 kg/m  VS noted, mild ill Constitutional: Pt appears in NAD HENT: Head: NCAT.  Right Ear: External ear normal.  Left Ear: External ear normal.  Bilat tm's with mild erythema.  Max sinus areas mild tender.  Pharynx with mild erythema, no exudate Eyes: . Pupils are equal, round, and reactive to light. Conjunctivae and EOM are normal Nose: without d/c or deformity Neck: Neck supple. Gross normal ROM Cardiovascular: Normal rate and regular rhythm.   Pulmonary/Chest: Effort normal and breath sounds without rales or wheezing.  Neurological: Pt is alert. At baseline orientation, motor grossly intact Skin: Skin is warm. No rashes, other new lesions, no LE edema Psychiatric: Pt behavior is normal without agitation  No other exam findings    Assessment & Plan:

## 2017-04-14 NOTE — Patient Instructions (Addendum)
You had the flu shot today  Ok to take the more frequent meclizine as needed for dizziness  Please take all new medication as prescribed - the antibiotic  Please continue all other medications as before, and refills have been done if requested.  Please have the pharmacy call with any other refills you may need.  Please continue your efforts at being more active, low cholesterol diet, and weight control.  Please keep your appointments with your specialists as you may have planned  Please remember to have your labs done at the [place] soon  Please return in 6 months, or sooner if needed

## 2017-04-14 NOTE — Assessment & Plan Note (Signed)
stable overall by history and exam, recent data reviewed with pt, and pt to continue medical treatment as before,  to f/u any worsening symptoms or concerns Lab Results  Component Value Date   LDLCALC 70 11/12/2016

## 2017-04-14 NOTE — Assessment & Plan Note (Signed)
stable overall by history and exam, recent data reviewed with pt, and pt to continue medical treatment as before,  to f/u any worsening symptoms or concerns BP Readings from Last 3 Encounters:  04/14/17 (!) 148/80  03/31/17 138/60  03/27/17 (!) 144/70

## 2017-04-14 NOTE — Assessment & Plan Note (Signed)
Asympt, mild,  Lab Results  Component Value Date   HGBA1C 5.8 11/12/2016  to cont diet, wt control, activity

## 2017-04-14 NOTE — Assessment & Plan Note (Signed)
Mild to mod, for antibx course,  to f/u any worsening symptoms or concerns 

## 2017-05-12 ENCOUNTER — Other Ambulatory Visit: Payer: Self-pay | Admitting: Internal Medicine

## 2017-05-15 ENCOUNTER — Ambulatory Visit: Payer: Medicare Other | Admitting: Internal Medicine

## 2017-05-21 ENCOUNTER — Other Ambulatory Visit: Payer: Medicare Other

## 2017-05-21 ENCOUNTER — Encounter: Payer: Self-pay | Admitting: Family Medicine

## 2017-05-21 ENCOUNTER — Ambulatory Visit (INDEPENDENT_AMBULATORY_CARE_PROVIDER_SITE_OTHER): Payer: Medicare Other | Admitting: Family Medicine

## 2017-05-21 VITALS — BP 138/72 | HR 62 | Temp 98.1°F | Ht 66.0 in | Wt 194.0 lb

## 2017-05-21 DIAGNOSIS — R197 Diarrhea, unspecified: Secondary | ICD-10-CM

## 2017-05-21 NOTE — Assessment & Plan Note (Signed)
Possible for viral in nature. Has been started recently on ABX.  - stool culture  - counseled on care  - if no improvement could consider CBC, AB xray.

## 2017-05-21 NOTE — Patient Instructions (Signed)
Thank you for coming in,   Please go to the lab and pick up the materials for the culture.   You can take Pepto or imodium.   Please take a probiotic with the antibiotic.    Please feel free to call with any questions or concerns at any time, at [phone]. --Dr. Raeford Razor

## 2017-05-21 NOTE — Progress Notes (Signed)
[name] - 81 y.o. male MRN [id number]  Date of birth: 03/05/1936  SUBJECTIVE:  Including CC & ROS.  Chief Complaint  Patient presents with  . Diarrhea    ongoing for 2 weeks. Denies blood in his stool. Denies abdominal pain. He tried immodium with no improvement.     [name] is a 81 y.o. male that is presenting with diarrhea intermittently for the past 2 weeks. He describes it as watery and nonbloody. This occurs after he eats any food. Has not had any fevers or chills. Denies any recent travel anywhere. Has not been trying any new foods. Symptoms seem to be staying the same. Has not had any abdominal pain or changes with voiding. Has not tried any medications.  He was seen on 10/2 and was provided azithromycin.  Colonoscopy performed 2010 showed a normal colon and biopsies revealed that the biopsies were normal.   Review of Systems  Constitutional: Negative for fever.  Gastrointestinal: Positive for diarrhea. Negative for abdominal pain, nausea and vomiting.  Genitourinary: Negative for dysuria.  Musculoskeletal: Negative for back pain.  Skin: Negative for color change.  Neurological: Negative for weakness.    HISTORY: Past Medical, Surgical, Social, and Family History Reviewed & Updated per EMR.   Pertinent Historical Findings include:  Past Medical History:  Diagnosis Date  . Acute bronchitis per pt no fever--  cough since discharge from hospital 07-02-2015   per pcp note 07-05-2015  mild to moderate bronchitis vs pna  . Allergic rhinitis   . Bladder cancer ([place])   . BPH (benign prostatic hyperplasia)   . Bradycardia   . CAD (coronary artery disease)   . CHF (congestive heart failure) ([place])   . Chronic low back pain   . DDD (degenerative disc disease), lumbosacral   . Depression   . Diverticulosis of colon (without mention of hemorrhage)   . Dyspnea   . Essential tremor   . Fatty liver disease, nonalcoholic   . GERD (gastroesophageal reflux disease)   . Glaucoma    . History of bladder cancer    2004--  TCC low-grade , non-invasive  . History of colon polyps   . History of exercise stress test    02-05-2007-- ETT (Clinically positive, electrically equivocal, submaximal ETT,  appropriate bp response to exercise  . History of hiatal hernia   . History of kidney stones   . History of peptic ulcer   . HLD (hyperlipidemia)   . HTN (hypertension)   . Nocturia   . Productive cough   . Rheumatoid arthritis(714.0)   . Right renal artery stenosis ([facility])    per cath 03-14-2009 --- 40-50%  . RLS (restless legs syndrome)   . Vertigo, intermittent   . Weak urinary stream   . Wears dentures    upper  . Wears glasses   . Wears hearing aid    bilateral  . Wears partial dentures    lower    Past Surgical History:  Procedure Laterality Date  . CARDIAC CATHETERIZATION  02-12-2007  dr gregg taylor   Non-obstructive CAD,  20% pLCX,  20% LAD, ef 65%  . CARDIAC CATHETERIZATION  03-14-2009  dr Johnsie Cancel   pLAD and mid diaginal 20-30% multiple lesions,  OM1 20%, right renal ostial 40-50%,  ef 60%  . CATARACT EXTRACTION W/ INTRAOCULAR LENS  IMPLANT, BILATERAL  2016  . HIP ARTHROSCOPY W/ LABRAL DEBRIDEMENT Left 06-29-2000   and chondraplasty  . LUMBAR [place] SURGERY  07-16-2005  . TONSILLECTOMY  AND ADENOIDECTOMY    . TRANSTHORACIC ECHOCARDIOGRAM  09-20-2010   normal LVF, ef 55-65%/  mild MR, TR and PR/  mild LAE  . TRANSURETHRAL RESECTION OF BLADDER TUMOR  09-21-2002    Allergies  Allergen Reactions  . Morphine And Related Other (See Comments)    Migraine   . Hydrochlorothiazide Other (See Comments)    REACTION: hyponatremia with daily use  . Tape Itching    Patient  Is ok to use paper tape  . Latex Rash  . Lisinopril Cough  . Sulfa Antibiotics Rash    Family History  Problem Relation Age of Onset  . Heart attack Mother   . Cancer Father        lung  . Cancer Sister        lung  . Diabetes Sister   . Cancer Brother        lung that spread to  brain     Social History   Socioeconomic History  . Marital status: Widowed    Spouse name: Not on file  . Number of children: 2  . Years of education: Not on file  . Highest education level: Not on file  Social Needs  . Financial resource strain: Not on file  . Food insecurity - worry: Not on file  . Food insecurity - inability: Not on file  . Transportation needs - medical: Not on file  . Transportation needs - non-medical: Not on file  Occupational History  . Occupation: retired from [employer]: RETIRED  Tobacco Use  . Smoking status: Former Smoker    Packs/day: 2.00    Years: 20.00    Pack years: 40.00    Types: Cigarettes    Last attempt to quit: 10/08/1982    Years since quitting: 34.6  . Smokeless tobacco: Never Used  Substance and Sexual Activity  . Alcohol use: Yes    Comment: rare wine or beer  . Drug use: No  . Sexual activity: Not Currently  Other Topics Concern  . Not on file  Social History Narrative  . Not on file     PHYSICAL EXAM:  VS: BP 138/72 (BP Location: Left Arm, Patient Position: Sitting, Cuff Size: Normal)   Pulse 62   Temp 98.1 F (36.7 C) (Oral)   Ht 5\' 6"  (1.676 m)   Wt 194 lb (88 kg)   SpO2 98%   BMI 31.31 kg/m  Physical Exam Gen: NAD, alert, cooperative with exam, well-appearing ENT: normal lips, normal nasal mucosa,  Eye: normal EOM, normal conjunctiva and lids CV:  no edema, +2 pedal pulses   Resp: no accessory muscle use, non-labored,  GI: no masses or tenderness, no hernia, soft, non-distended, +BS  Skin: no rashes, no areas of induration  Neuro: normal tone, normal sensation to touch Psych:  normal insight, alert and oriented MSK: normal gait, normal strength       ASSESSMENT & PLAN:   Diarrhea Possible for viral in nature. Has been started recently on ABX.  - stool culture  - counseled on care  - if no improvement could consider CBC, AB xray.

## 2017-05-22 ENCOUNTER — Encounter: Payer: Self-pay | Admitting: Podiatry

## 2017-05-22 ENCOUNTER — Ambulatory Visit (INDEPENDENT_AMBULATORY_CARE_PROVIDER_SITE_OTHER): Payer: Medicare Other

## 2017-05-22 ENCOUNTER — Other Ambulatory Visit: Payer: Self-pay | Admitting: Podiatry

## 2017-05-22 ENCOUNTER — Other Ambulatory Visit: Payer: Medicare Other

## 2017-05-22 ENCOUNTER — Ambulatory Visit (INDEPENDENT_AMBULATORY_CARE_PROVIDER_SITE_OTHER): Payer: Medicare Other | Admitting: Podiatry

## 2017-05-22 DIAGNOSIS — M205X1 Other deformities of toe(s) (acquired), right foot: Secondary | ICD-10-CM

## 2017-05-22 DIAGNOSIS — M779 Enthesopathy, unspecified: Secondary | ICD-10-CM

## 2017-05-22 DIAGNOSIS — M79676 Pain in unspecified toe(s): Secondary | ICD-10-CM | POA: Diagnosis not present

## 2017-05-22 DIAGNOSIS — R197 Diarrhea, unspecified: Secondary | ICD-10-CM

## 2017-05-22 MED ORDER — TRIAMCINOLONE ACETONIDE 10 MG/ML IJ SUSP
10.0000 mg | Freq: Once | INTRAMUSCULAR | Status: AC
  Administered 2017-05-22: 10 mg

## 2017-05-26 LAB — STOOL CULTURE
MICRO NUMBER: 81264767
MICRO NUMBER: 81264768
MICRO NUMBER: 81264769
SHIGA RESULT: NOT DETECTED
SPECIMEN QUALITY:: ADEQUATE
SPECIMEN QUALITY:: ADEQUATE
SPECIMEN QUALITY:: ADEQUATE

## 2017-05-27 NOTE — Progress Notes (Signed)
Subjective:    Patient ID: [name], male   DOB: 81 y.o.   MRN: [id number]   HPI patient presents with a lot of pain in the big toe joint of the right foot with spur formation narrowed joint surface and inflammation. This is been ongoing and is worsened over the last couple months    ROS      Objective:  Physical Exam neurovascular status intact muscle strength was adequate with inflammatory changes of the first MPJ right with fluid around the joint surface spur formation and moderate discomfort with palpation     Assessment:    Hallux limitus condition with inflammatory capsulitis noted and also moderate tenderness condition dorsum foot     Plan:    H&P x-rays reviewed condition discussed. At this point I did go ahead today and I injected around the joint surface with 3 mg Kenalog 5 mg Xylocaine and I advised on padding this area and we discussed possible surgical intervention with review of x-rays. We will see this patient back in a few weeks see the response and decide on appropriate long-term treatment  X-rays indicate that there is spurring narrowing of the joint surface with signs of inflammation

## 2017-06-01 ENCOUNTER — Encounter (HOSPITAL_COMMUNITY): Payer: Self-pay | Admitting: Emergency Medicine

## 2017-06-01 ENCOUNTER — Emergency Department (HOSPITAL_COMMUNITY): Payer: Medicare Other

## 2017-06-01 ENCOUNTER — Emergency Department (HOSPITAL_COMMUNITY)
Admission: EM | Admit: 2017-06-01 | Discharge: 2017-06-01 | Disposition: A | Discharge status: Home / Self Care | Payer: Medicare Other | Attending: Emergency Medicine | Admitting: Emergency Medicine

## 2017-06-01 ENCOUNTER — Other Ambulatory Visit: Payer: Self-pay

## 2017-06-01 DIAGNOSIS — I2510 Atherosclerotic heart disease of native coronary artery without angina pectoris: Secondary | ICD-10-CM | POA: Insufficient documentation

## 2017-06-01 DIAGNOSIS — Z91040 Latex allergy status: Secondary | ICD-10-CM | POA: Diagnosis not present

## 2017-06-01 DIAGNOSIS — Y929 Unspecified place or not applicable: Secondary | ICD-10-CM | POA: Insufficient documentation

## 2017-06-01 DIAGNOSIS — I509 Heart failure, unspecified: Secondary | ICD-10-CM | POA: Diagnosis not present

## 2017-06-01 DIAGNOSIS — S0990XA Unspecified injury of head, initial encounter: Secondary | ICD-10-CM | POA: Insufficient documentation

## 2017-06-01 DIAGNOSIS — Y939 Activity, unspecified: Secondary | ICD-10-CM | POA: Diagnosis not present

## 2017-06-01 DIAGNOSIS — W1809XA Striking against other object with subsequent fall, initial encounter: Secondary | ICD-10-CM | POA: Diagnosis not present

## 2017-06-01 DIAGNOSIS — Z79899 Other long term (current) drug therapy: Secondary | ICD-10-CM | POA: Diagnosis not present

## 2017-06-01 DIAGNOSIS — I110 Hypertensive heart disease with heart failure: Secondary | ICD-10-CM | POA: Diagnosis not present

## 2017-06-01 DIAGNOSIS — Y999 Unspecified external cause status: Secondary | ICD-10-CM | POA: Insufficient documentation

## 2017-06-01 DIAGNOSIS — Z87891 Personal history of nicotine dependence: Secondary | ICD-10-CM | POA: Diagnosis not present

## 2017-06-01 DIAGNOSIS — S0181XA Laceration without foreign body of other part of head, initial encounter: Secondary | ICD-10-CM | POA: Diagnosis not present

## 2017-06-01 DIAGNOSIS — R51 Headache: Secondary | ICD-10-CM | POA: Diagnosis not present

## 2017-06-01 DIAGNOSIS — S098XXA Other specified injuries of head, initial encounter: Secondary | ICD-10-CM | POA: Diagnosis not present

## 2017-06-01 DIAGNOSIS — S0101XA Laceration without foreign body of scalp, initial encounter: Secondary | ICD-10-CM | POA: Diagnosis not present

## 2017-06-01 MED ORDER — ONDANSETRON 4 MG PO TBDP
4.0000 mg | ORAL_TABLET | Freq: Once | ORAL | Status: AC
  Administered 2017-06-01: 4 mg via ORAL
  Filled 2017-06-01: qty 1

## 2017-06-01 MED ORDER — BACITRACIN ZINC 500 UNIT/GM EX OINT
TOPICAL_OINTMENT | CUTANEOUS | Status: AC
  Filled 2017-06-01: qty 2.7

## 2017-06-01 MED ORDER — ACETAMINOPHEN 325 MG PO TABS
650.0000 mg | ORAL_TABLET | Freq: Once | ORAL | Status: AC
  Administered 2017-06-01: 650 mg via ORAL
  Filled 2017-06-01: qty 2

## 2017-06-01 MED ORDER — BACITRACIN ZINC 500 UNIT/GM EX OINT
TOPICAL_OINTMENT | CUTANEOUS | Status: DC
Start: 2017-06-01 — End: 2017-06-01
  Filled 2017-06-01: qty 0.9

## 2017-06-01 NOTE — ED Provider Notes (Signed)
[facility] DEPT Provider Note   CSN: [id number] Arrival date & time: 06/01/17  0224     History   Chief Complaint Chief Complaint  Patient presents with  . Head Injury    HPI [name] is a 81 y.o. male.  The history is provided by the patient and medical records. No language interpreter was used.  Head Injury   Associated symptoms include vomiting. Pertinent negatives include no numbness and no weakness.   [name] is a 81 y.o. male  with a PMH as listed below who presents to the Emergency Department for evaluation following head injury just prior to arrival. Patient states that he has very bad arthritis to his right toe. He accidentally hit the toe, causing a great deal of pain which made him lose his balance and fall, striking the left side of his head. No LOC. He hit left side of his head, sustaining laceration. He endorses feeling nauseous and did have two episodes of emesis prior to arrival. He also endorses dizziness since the fall, but states this is not a room-spinning dizziness, more just an unsteady feeling. He is not on anticoagulants nor aspirin. Denies neck pain, back pain, extremity pain, abdominal pain. Family believe he is at his baseline mental status with no confusion. No medications taken prior to arrival for symptoms.   Past Medical History:  Diagnosis Date  . Acute bronchitis per pt no fever--  cough since discharge from hospital 07-02-2015   per pcp note 07-05-2015  mild to moderate bronchitis vs pna  . Allergic rhinitis   . Bladder cancer ([place])   . BPH (benign prostatic hyperplasia)   . Bradycardia   . CAD (coronary artery disease)   . CHF (congestive heart failure) ([place])   . Chronic low back pain   . DDD (degenerative disc disease), lumbosacral   . Depression   . Diverticulosis of colon (without mention of hemorrhage)   . Dyspnea   . Essential tremor   . Fatty liver disease, nonalcoholic   . GERD  (gastroesophageal reflux disease)   . Glaucoma   . History of bladder cancer    2004--  TCC low-grade , non-invasive  . History of colon polyps   . History of exercise stress test    02-05-2007-- ETT (Clinically positive, electrically equivocal, submaximal ETT,  appropriate bp response to exercise  . History of hiatal hernia   . History of kidney stones   . History of peptic ulcer   . HLD (hyperlipidemia)   . HTN (hypertension)   . Nocturia   . Productive cough   . Rheumatoid arthritis(714.0)   . Right renal artery stenosis (H[facility])    per cath 03-14-2009 --- 40-50%  . RLS (restless legs syndrome)   . Vertigo, intermittent   . Weak urinary stream   . Wears dentures    upper  . Wears glasses   . Wears hearing aid    bilateral  . Wears partial dentures    lower    Patient Active Problem List   Diagnosis Date Noted  . Acute sinus infection 04/14/2017  . Nausea and vomiting 03/27/2017  . Hyponatremia 03/17/2017  . Right lumbar radiculopathy 01/21/2017  . Fall 01/21/2017  . Synovial cyst 01/21/2017  . Chronic pain 09/11/2016  . Generalized weakness 05/22/2016  . UTI (urinary tract infection) 05/22/2016  . Weakness 05/22/2016  . Fever blister 10/18/2015  . Dyspnea 10/17/2015  . Bradycardia 09/17/2015  . Chest pain at  rest 09/17/2015  . Depression 09/17/2015  . CAD in native artery 09/17/2015  . CHF (congestive heart failure) ([place]) 09/17/2015  . Pulmonary hypertension ([place]) 09/17/2015  . Bladder cancer ([place]) 09/17/2015  . Essential tremor 09/17/2015  . Chest pain 09/17/2015  . Anginal chest pain at rest [facility]) 09/17/2015  . Hypokalemia 07/13/2015  . Vertigo 07/01/2015  . Dizzinesses 07/01/2015  . Spinal stenosis of lumbar region at multiple levels 07/06/2014  . Angular cheilitis 05/05/2014  . Left lumbar radiculopathy 05/04/2014  . Right elbow pain 12/29/2013  . Lateral epicondylitis of right elbow 12/29/2013  . Cough 09/14/2013  . Spinal stenosis, lumbar 07/27/2013   . Palpable mass of neck 07/21/2013  . Vasovagal episode 01/10/2013  . Left otitis media 10/28/2012  . Impaired glucose tolerance 10/28/2012  . Cellulitis 04/24/2012  . Hemorrhoids 04/24/2012  . Syncope 04/21/2012  . Preventative health care 11/28/2010  . DEPRESSION 09/12/2010  . RENAL ARTERY STENOSIS 09/12/2010  . FATIGUE 09/12/2010  . BUNION, RIGHT FOOT 02/05/2010  . RECTAL BLEEDING 07/10/2009  . Diarrhea 07/10/2009  . DIVERTICULOSIS OF COLON 03/28/2009  . FATTY LIVER DISEASE 03/28/2009  . BRADYCARDIA 03/20/2009  . DIZZINESS 10/17/2008  . CHEST PAIN 10/17/2008  . Malignant neoplasm of bladder ([place]) 02/07/2008  . ALLERGIC RHINITIS 02/07/2008  . COLONIC POLYPS, HX OF 02/07/2008  . Hyperlipidemia 11/24/2007  . Essential hypertension 11/24/2007  . GERD 11/24/2007  . HIATAL HERNIA WITH REFLUX 11/24/2007  . RENAL INSUFFICIENCY 11/24/2007  . Rheumatoid arthritis(714.0) 11/24/2007  . [place] DISEASE, LUMBAR 11/24/2007  . ADENOIDECTOMY, HX OF 11/24/2007    Past Surgical History:  Procedure Laterality Date  . CARDIAC CATHETERIZATION  02-12-2007  dr gregg taylor   Non-obstructive CAD,  20% pLCX,  20% LAD, ef 65%  . CARDIAC CATHETERIZATION  03-14-2009  dr Johnsie Cancel   pLAD and mid diaginal 20-30% multiple lesions,  OM1 20%, right renal ostial 40-50%,  ef 60%  . CATARACT EXTRACTION W/ INTRAOCULAR LENS  IMPLANT, BILATERAL  2016  . CYSTOSCOPY WITH COLD CUP RESECTION AND FULGERATION N/A 07/11/2015   Performed by Rana Snare, MD at [facility]  . DECOMPRESSION L4 - L5 WITH EXCISION OF SYNOVIAL CYST AND, LATERAL MASS FUSION 1 LEVEL N/A 07/27/2013   Performed by Johnn Hai, MD at [facility] ORS  . HIP ARTHROSCOPY W/ LABRAL DEBRIDEMENT Left 06-29-2000   and chondraplasty  . LUMBAR [place] SURGERY  07-16-2005  . LUMBAR LAMINECTOMY/DECOMPRESSION MICRODISCECTOMY Left 07/09/2011   Performed by Johnn Hai, MD at [facility] ORS  . MICRO LUMBAR DECOMPRESSION L5-S1 ON LEFT, L4-5 REDO Left  07/06/2014   Performed by Johnn Hai, MD at [facility] ORS  . Spinal cord stimulator placement N/A 09/11/2016   Performed by Melina Schools, MD at [facility]  . TONSILLECTOMY AND ADENOIDECTOMY    . TRANSTHORACIC ECHOCARDIOGRAM  09-20-2010   normal LVF, ef 55-65%/  mild MR, TR and PR/  mild LAE  . TRANSURETHRAL RESECTION OF BLADDER TUMOR  09-21-2002       Home Medications    Prior to Admission medications   Medication Sig Start Date End Date Taking? Authorizing Provider  acetaminophen (TYLENOL) 650 MG CR tablet Take 1,300 mg by mouth at bedtime as needed for pain.   Yes [provider]  albuterol (PROVENTIL HFA;VENTOLIN HFA) 108 (90 Base) MCG/ACT inhaler Inhale 2 puffs into the lungs every 6 (six) hours as needed for wheezing or shortness of breath. 06/04/16  Yes Biagio Borg, MD  brimonidine (ALPHAGAN) 0.15 %  ophthalmic solution Place 1 drop into both eyes 2 (two) times daily.   Yes [provider]  citalopram (CELEXA) 10 MG tablet TAKE 1 TABLET EVERY MORNING 05/13/17  Yes Biagio Borg, MD  clotrimazole (LOTRIMIN) 1 % cream Apply 1 application topically 2 (two) times daily as needed (FOR RASH). Prescribed for rash in groin   Yes [provider]  gabapentin (NEURONTIN) 300 MG capsule TAKE 1 TO 2 CAPSULES FOUR TIMES DAILY  AFTER MEALS AND AT BEDTIME. 03/04/17  Yes Biagio Borg, MD  hydroxypropyl methylcellulose / hypromellose (ISOPTO TEARS / GONIOVISC) 2.5 % ophthalmic solution Place 1 drop into both eyes 2 (two) times daily.   Yes [provider]  meclizine (ANTIVERT) 25 MG tablet Take 1 tablet (25 mg total) by mouth 3 (three) times daily as needed for dizziness. 04/14/17  Yes Biagio Borg, MD  methocarbamol (ROBAXIN) 500 MG tablet Take 1 tablet (500 mg total) by mouth 3 (three) times daily as needed for muscle spasms. 09/11/16  Yes Melina Schools, MD  nitroGLYCERIN (NITROSTAT) 0.4 MG SL tablet Place 1 tablet (0.4 mg total) under the tongue every 5 (five) minutes  as needed. Chest pain 12/14/16  Yes Biagio Borg, MD  Nutritional Supplements (JUICE PLUS FIBRE PO) Take 1 each 2 (two) times daily by mouth.   Yes [provider]  omeprazole (PRILOSEC) 40 MG capsule Take 1 capsule by mouth twice daily. 04/10/17  Yes Zehr, Laban Emperor, PA-C  ondansetron (ZOFRAN) 4 MG tablet Take 1 tablet (4 mg total) by mouth every 8 (eight) hours as needed for nausea or vomiting. 09/11/16  Yes Melina Schools, MD  potassium chloride (K-DUR,KLOR-CON) 10 MEQ tablet TAKE 2 TABLETS EVERY DAY 05/13/17  Yes Biagio Borg, MD  primidone (MYSOLINE) 250 MG tablet TAKE 1/2 TABLET THREE TIMES DAILY 12/29/16  Yes Biagio Borg, MD  simvastatin (ZOCOR) 40 MG tablet TAKE 1 AND 1/2 TABLETS AT BEDTIME 12/29/16  Yes Biagio Borg, MD  tamsulosin (FLOMAX) 0.4 MG CAPS capsule Take 1 capsule (0.4 mg total) by mouth daily. Patient taking differently: Take 0.8 mg by mouth at bedtime.  05/17/15  Yes Biagio Borg, MD  timolol (TIMOPTIC) 0.5 % ophthalmic solution Place 1 drop into both eyes 2 (two) times daily.  05/30/15  Yes [provider]  Travoprost, BAK Free, (TRAVATAN Z) 0.004 % SOLN ophthalmic solution Place 1 drop into both eyes at bedtime. 09/14/13  Yes Biagio Borg, MD  triamcinolone (KENALOG) 0.025 % cream Apply 1 application topically 2 (two) times daily as needed (rash).    Yes [provider]  amLODipine (NORVASC) 5 MG tablet TAKE 1 TABLET (5 MG TOTAL) BY MOUTH DAILY. 12/29/16   Biagio Borg, MD  fluticasone [facility]) 50 MCG/ACT nasal spray USE 2 SPRAYS IN EACH NOSTRIL EVERY DAY AS NEEDED  FOR  RHINITIS/ALLERGIES 08/16/16   Biagio Borg, MD  hydrochlorothiazide (HYDRODIURIL) 25 MG tablet TAKE 1 TABLET EVERY DAY 05/13/17   Biagio Borg, MD  losartan (COZAAR) 50 MG tablet TAKE 1 TABLET EVERY DAY 12/29/16   Biagio Borg, MD    Family History Family History  Problem Relation Age of Onset  . Heart attack Mother   . Cancer Father        lung  . Cancer Sister        lung    . Diabetes Sister   . Cancer Brother        lung that spread to  brain    Social History Social History   Tobacco Use  . Smoking status: Former Smoker    Packs/day: 2.00    Years: 20.00    Pack years: 40.00    Types: Cigarettes    Last attempt to quit: 10/08/1982    Years since quitting: 34.6  . Smokeless tobacco: Never Used  Substance Use Topics  . Alcohol use: Yes    Comment: rare wine or beer  . Drug use: No     Allergies   Morphine and related; Hydrochlorothiazide; Tape; Latex; Lisinopril; and Sulfa antibiotics   Review of Systems Review of Systems  Gastrointestinal: Positive for nausea and vomiting. Negative for abdominal pain, constipation and diarrhea.  Skin: Positive for wound.  Neurological: Positive for dizziness and headaches. Negative for syncope, speech difficulty, weakness and numbness.  All other systems reviewed and are negative.    Physical Exam Updated Vital Signs BP 132/76   Pulse 62   Temp 97.6 F (36.4 C) (Oral)   Resp 20   Ht 5\' 6"  (1.676 m)   Wt 86.2 kg (190 lb)   SpO2 94%   BMI 30.67 kg/m   Physical Exam  Constitutional: He is oriented to person, place, and time. He appears well-developed and well-nourished. No distress.  HENT:  Head: Normocephalic. Head is without raccoon's eyes and without Battle's sign.  Right Ear: No hemotympanum.  Left Ear: No hemotympanum.  3 cm laceration to left lateral scalp.  Neck:  No midline tenderness. Full ROM.   Cardiovascular: Normal rate, regular rhythm and normal heart sounds.  No murmur heard. Pulmonary/Chest: Effort normal and breath sounds normal. No respiratory distress.  Abdominal: Soft. He exhibits no distension. There is no tenderness.  Musculoskeletal: Normal range of motion.  Neurological: He is alert and oriented to person, place, and time.  Alert, oriented, thought content appropriate, able to give a coherent history. Speech is clear and goal oriented, able to follow commands.   Cranial Nerves:  II:  Peripheral visual fields grossly normal, pupils equal, round, reactive to light III, IV, VI: EOM intact bilaterally, ptosis not present V,VII: smile symmetric, eyes kept closed tightly against resistance, facial light touch sensation equal VIII: hearing grossly normal IX, X: symmetric soft palate movement, uvula elevates symmetrically  XI: bilateral shoulder shrug symmetric and strong XII: midline tongue extension 5/5 muscle strength in upper and lower extremities bilaterally including strong and equal grip strength and dorsiflexion/plantar flexion Sensory to light touch normal in all four extremities.  Normal finger-to-nose and rapid alternating movements; No drift.  Skin: Skin is warm and dry.  Nursing note and vitals reviewed.    ED Treatments / Results  Labs (all labs ordered are listed, but only abnormal results are displayed) Labs Reviewed - No data to display  EKG  EKG Interpretation None       Radiology Ct Head Wo Contrast  Result Date: 06/01/2017 CLINICAL DATA:  81 year old male with headache. Fall. Laceration to the left side of the head. EXAM: CT HEAD WITHOUT CONTRAST TECHNIQUE: Contiguous axial images were obtained from the base of the skull through the vertex without intravenous contrast. COMPARISON:  Brain MRI dated 05/23/2016 FINDINGS: Brain: The ventricles and sulci appropriate size for patient's age. Mild periventricular and deep white matter chronic microvascular ischemic changes noted. There is no acute intracranial hemorrhage. No mass effect or midline shift. No extra-axial fluid collection. Vascular: There is high attenuation of the cerebral vasculature likely representing hemoconcentration. Skull: Normal. Negative for fracture or focal  lesion. Sinuses/Orbits: There is near complete opacification of left sphenoid sinus. The remainder of the visualized paranasal sinuses and mastoid air cells are clear. No air-fluid levels. Bilateral  cataract surgeries noted. Other: None IMPRESSION: 1. No acute intracranial hemorrhage. 2. Minimal age related to chronic microvascular ischemic changes. Electronically Signed   By: Anner Crete M.D.   On: 06/01/2017 03:40    Procedures .Marland KitchenLaceration Repair Date/Time: 06/01/2017 6:00 AM Performed by: Ward, Ozella Almond, PA-C Authorized by: Ward, Ozella Almond, PA-C   Consent:    Consent obtained:  Verbal   Consent given by:  Patient   Risks discussed:  Pain, poor cosmetic result and poor wound healing Anesthesia (see MAR for exact dosages):    Anesthesia method:  None Laceration details:    Location:  Scalp   Length (cm):  3 Repair type:    Repair type:  Simple Exploration:    Hemostasis achieved with:  Direct pressure Skin repair:    Repair method:  Staples   Number of staples:  2 Approximation:    Approximation:  Close Post-procedure details:    Dressing:  Antibiotic ointment and non-adherent dressing   (including critical care time)    Medications Ordered in ED Medications  bacitracin 500 UNIT/GM ointment (not administered)  bacitracin 500 UNIT/GM ointment (not administered)  ondansetron (ZOFRAN-ODT) disintegrating tablet 4 mg (4 mg Oral Given 06/01/17 0325)  acetaminophen (TYLENOL) tablet 650 mg (650 mg Oral Given 06/01/17 0433)     Initial Impression / Assessment and Plan / ED Course  I have reviewed the triage vital signs and the nursing notes.  Pertinent labs & imaging results that were available during my care of the patient were reviewed by me and considered in my medical decision making (see chart for details).    [name] is a 81 y.o. male who presents to ED for mechanical fall sustaining head injury. No focal neuro deficits on exam. Scalp laceration repaired with staples as dictated above. CT head negative for acute injuries. Return for staple removal in 1 week. Symptomatic home care instructions, head injury precautions and return precautions  discussed. All questions answered.   Patient seen by and discussed with Dr. Florina Ou who agrees with treatment plan.    Final Clinical Impressions(s) / ED Diagnoses   Final diagnoses:  Injury of head, initial encounter    ED Discharge Orders    None       Ward, Ozella Almond, PA-C 06/01/17 0610    Shanon Rosser, MD 06/01/17 0630

## 2017-06-01 NOTE — ED Notes (Signed)
Bed: WA11 Expected date:  Expected time:  Means of arrival:  Comments: 

## 2017-06-01 NOTE — Discharge Instructions (Signed)
It was my pleasure taking care of you today!   Fortunately your CT scan was very reassuring.   Tylenol as needed for pain.   Keep the laceration site dry for the next 24 hours and leave the dressing in place. After 24 hours you may remove the dressing and gently clean the laceration site with antibacterial soap and warm water. Do not scrub the area. Do not soak the area and water for long periods of time. Apply topical antibiotic 1-2 times per day for the next 7 days. Return to the emergency department in 7 days for removal of the staples.  Scalp laceration generally heal very well with minimal risk of infection, however, you should return sooner for any signs of infection which would include increased redness around the wound, increased swelling, new drainage of yellow pus.  Return to ER for new or worsening symptoms, any additional concerns.

## 2017-06-01 NOTE — ED Triage Notes (Signed)
Pt brought in by EMS from home with c/o head injury after his mechanical fall tonight.  Pt reported that he has injury on his right foot/great toe and when great toe hit something on the floor, he lost balance and fell, hitting head into a metal filing cabinet sustaining scalp laceration on the left side of head.  Pt denies loss of consciousness, but now admits to having dizziness and nausea---- has had vomiting episode en route.

## 2017-06-08 ENCOUNTER — Encounter: Payer: Self-pay | Admitting: Podiatry

## 2017-06-08 ENCOUNTER — Ambulatory Visit (INDEPENDENT_AMBULATORY_CARE_PROVIDER_SITE_OTHER): Payer: Medicare Other | Admitting: Podiatry

## 2017-06-08 DIAGNOSIS — H16103 Unspecified superficial keratitis, bilateral: Secondary | ICD-10-CM | POA: Diagnosis not present

## 2017-06-08 DIAGNOSIS — H04123 Dry eye syndrome of bilateral lacrimal glands: Secondary | ICD-10-CM | POA: Diagnosis not present

## 2017-06-08 DIAGNOSIS — Z961 Presence of intraocular lens: Secondary | ICD-10-CM | POA: Diagnosis not present

## 2017-06-08 DIAGNOSIS — M205X1 Other deformities of toe(s) (acquired), right foot: Secondary | ICD-10-CM

## 2017-06-08 DIAGNOSIS — H401133 Primary open-angle glaucoma, bilateral, severe stage: Secondary | ICD-10-CM | POA: Diagnosis not present

## 2017-06-08 NOTE — Progress Notes (Signed)
Subjective:    Patient ID: [name], male    DOB: September 17, 1935, 81 y.o.   MRN: [id number]  HPI He fell and had two staples placed in his head and is here to have them removed.   11/19/18L  He went to the ED after he lost his balance and fell. He hit the left side of his head.  There was no LOC.  He did have a laceration.  He had nausea at the time and vomited twice prior to arriving in ED.  He also had dizziness/unsteady feeling.  He does not take aspirin or other anticoagulants.   There was no confusion.  Ct of head was negative.  No neuro deficits.  2 staples were placed.  He was advised local wound care.  He was diagnosed with a concussion.  He did fall on his left side and has a bruise on his left upper leg. He injured his left elbow and left shoulder.  His mild bruising and other injuries are improving and he is not concerned about them.  Head injury, concussion: He is here to have his staples removed.  He has had headaches.  He takes two tylenol three times a day.  That is helping.  The headaches are getting better.  He denies any dizziness, lightheadedness, numbness/tingling, weakness, change in his vision or nausea since emergency room visit.  He denies pain at the site of the laceration.     Medications and allergies reviewed with patient and updated if appropriate.  Patient Active Problem List   Diagnosis Date Noted  . Acute sinus infection 04/14/2017  . Nausea and vomiting 03/27/2017  . Hyponatremia 03/17/2017  . Right lumbar radiculopathy 01/21/2017  . Fall 01/21/2017  . Synovial cyst 01/21/2017  . Chronic pain 09/11/2016  . Generalized weakness 05/22/2016  . UTI (urinary tract infection) 05/22/2016  . Weakness 05/22/2016  . Fever blister 10/18/2015  . Dyspnea 10/17/2015  . Bradycardia 09/17/2015  . Chest pain at rest 09/17/2015  . Depression 09/17/2015  . CAD in native artery 09/17/2015  . CHF (congestive heart failure) ([place]) 09/17/2015  . Pulmonary hypertension  ([place]) 09/17/2015  . Bladder cancer ([place]) 09/17/2015  . Essential tremor 09/17/2015  . Chest pain 09/17/2015  . Anginal chest pain at rest [facility]) 09/17/2015  . Hypokalemia 07/13/2015  . Vertigo 07/01/2015  . Dizzinesses 07/01/2015  . Spinal stenosis of lumbar region at multiple levels 07/06/2014  . Angular cheilitis 05/05/2014  . Left lumbar radiculopathy 05/04/2014  . Right elbow pain 12/29/2013  . Lateral epicondylitis of right elbow 12/29/2013  . Cough 09/14/2013  . Spinal stenosis, lumbar 07/27/2013  . Palpable mass of neck 07/21/2013  . Vasovagal episode 01/10/2013  . Left otitis media 10/28/2012  . Impaired glucose tolerance 10/28/2012  . Cellulitis 04/24/2012  . Hemorrhoids 04/24/2012  . Syncope 04/21/2012  . Preventative health care 11/28/2010  . DEPRESSION 09/12/2010  . RENAL ARTERY STENOSIS 09/12/2010  . FATIGUE 09/12/2010  . BUNION, RIGHT FOOT 02/05/2010  . RECTAL BLEEDING 07/10/2009  . Diarrhea 07/10/2009  . DIVERTICULOSIS OF COLON 03/28/2009  . FATTY LIVER DISEASE 03/28/2009  . BRADYCARDIA 03/20/2009  . DIZZINESS 10/17/2008  . CHEST PAIN 10/17/2008  . Malignant neoplasm of bladder ([place]) 02/07/2008  . ALLERGIC RHINITIS 02/07/2008  . COLONIC POLYPS, HX OF 02/07/2008  . Hyperlipidemia 11/24/2007  . Essential hypertension 11/24/2007  . GERD 11/24/2007  . HIATAL HERNIA WITH REFLUX 11/24/2007  . RENAL INSUFFICIENCY 11/24/2007  . Rheumatoid arthritis(714.0) 11/24/2007  .  [place] DISEASE, LUMBAR 11/24/2007  . ADENOIDECTOMY, HX OF 11/24/2007    Current Outpatient Medications on File Prior to Visit  Medication Sig Dispense Refill  . acetaminophen (TYLENOL) 650 MG CR tablet Take 1,300 mg by mouth at bedtime as needed for pain.    Marland Kitchen albuterol (PROVENTIL HFA;VENTOLIN HFA) 108 (90 Base) MCG/ACT inhaler Inhale 2 puffs into the lungs every 6 (six) hours as needed for wheezing or shortness of breath. 1 Inhaler 11  . amLODipine (NORVASC) 5 MG tablet TAKE 1 TABLET (5 MG TOTAL)  BY MOUTH DAILY. 90 tablet 2  . brimonidine (ALPHAGAN) 0.15 % ophthalmic solution Place 1 drop into both eyes 2 (two) times daily.    . citalopram (CELEXA) 10 MG tablet TAKE 1 TABLET EVERY MORNING 90 tablet 3  . clotrimazole (LOTRIMIN) 1 % cream Apply 1 application topically 2 (two) times daily as needed (FOR RASH). Prescribed for rash in groin    . fluticasone (FLONASE) 50 MCG/ACT nasal spray USE 2 SPRAYS IN EACH NOSTRIL EVERY DAY AS NEEDED  FOR  RHINITIS/ALLERGIES 48 g 3  . gabapentin (NEURONTIN) 300 MG capsule TAKE 1 TO 2 CAPSULES FOUR TIMES DAILY  AFTER MEALS AND AT BEDTIME. 720 capsule 3  . hydroxypropyl methylcellulose / hypromellose (ISOPTO TEARS / GONIOVISC) 2.5 % ophthalmic solution Place 1 drop into both eyes 2 (two) times daily.    Marland Kitchen losartan (COZAAR) 50 MG tablet TAKE 1 TABLET EVERY DAY 90 tablet 2  . meclizine (ANTIVERT) 25 MG tablet Take 1 tablet (25 mg total) by mouth 3 (three) times daily as needed for dizziness. 90 tablet 2  . methocarbamol (ROBAXIN) 500 MG tablet Take 1 tablet (500 mg total) by mouth 3 (three) times daily as needed for muscle spasms. 21 tablet 0  . nitroGLYCERIN (NITROSTAT) 0.4 MG SL tablet Place 1 tablet (0.4 mg total) under the tongue every 5 (five) minutes as needed. Chest pain 60 tablet 5  . Nutritional Supplements (JUICE PLUS FIBRE PO) Take 1 each 2 (two) times daily by mouth.    Marland Kitchen omeprazole (PRILOSEC) 40 MG capsule Take 1 capsule by mouth twice daily. 180 capsule 3  . ondansetron (ZOFRAN) 4 MG tablet Take 1 tablet (4 mg total) by mouth every 8 (eight) hours as needed for nausea or vomiting. 20 tablet 0  . potassium chloride (K-DUR,KLOR-CON) 10 MEQ tablet TAKE 2 TABLETS EVERY DAY 180 tablet 1  . primidone (MYSOLINE) 250 MG tablet TAKE 1/2 TABLET THREE TIMES DAILY 135 tablet 2  . simvastatin (ZOCOR) 40 MG tablet TAKE 1 AND 1/2 TABLETS AT BEDTIME 135 tablet 2  . tamsulosin (FLOMAX) 0.4 MG CAPS capsule Take 1 capsule (0.4 mg total) by mouth daily. (Patient taking  differently: Take 0.8 mg by mouth at bedtime. ) 90 capsule 3  . timolol (TIMOPTIC) 0.5 % ophthalmic solution Place 1 drop into both eyes 2 (two) times daily.   1  . Travoprost, BAK Free, (TRAVATAN Z) 0.004 % SOLN ophthalmic solution Place 1 drop into both eyes at bedtime. 5 mL 5  . triamcinolone (KENALOG) 0.025 % cream Apply 1 application topically 2 (two) times daily as needed (rash).     . hydrochlorothiazide (HYDRODIURIL) 25 MG tablet TAKE 1 TABLET EVERY DAY (Patient not taking: Reported on 06/09/2017) 90 tablet 1   No current facility-administered medications on file prior to visit.     Past Medical History:  Diagnosis Date  . Acute bronchitis per pt no fever--  cough since discharge from hospital 07-02-2015  per pcp note 07-05-2015  mild to moderate bronchitis vs pna  . Allergic rhinitis   . Bladder cancer ([place])   . BPH (benign prostatic hyperplasia)   . Bradycardia   . CAD (coronary artery disease)   . CHF (congestive heart failure) ([place])   . Chronic low back pain   . DDD (degenerative disc disease), lumbosacral   . Depression   . Diverticulosis of colon (without mention of hemorrhage)   . Dyspnea   . Essential tremor   . Fatty liver disease, nonalcoholic   . GERD (gastroesophageal reflux disease)   . Glaucoma   . History of bladder cancer    2004--  TCC low-grade , non-invasive  . History of colon polyps   . History of exercise stress test    02-05-2007-- ETT (Clinically positive, electrically equivocal, submaximal ETT,  appropriate bp response to exercise  . History of hiatal hernia   . History of kidney stones   . History of peptic ulcer   . HLD (hyperlipidemia)   . HTN (hypertension)   . Nocturia   . Productive cough   . Rheumatoid arthritis(714.0)   . Right renal artery stenosis ([facility])    per cath 03-14-2009 --- 40-50%  . RLS (restless legs syndrome)   . Vertigo, intermittent   . Weak urinary stream   . Wears dentures    upper  . Wears glasses   . Wears  hearing aid    bilateral  . Wears partial dentures    lower    Past Surgical History:  Procedure Laterality Date  . CARDIAC CATHETERIZATION  02-12-2007  dr gregg taylor   Non-obstructive CAD,  20% pLCX,  20% LAD, ef 65%  . CARDIAC CATHETERIZATION  03-14-2009  dr Johnsie Cancel   pLAD and mid diaginal 20-30% multiple lesions,  OM1 20%, right renal ostial 40-50%,  ef 60%  . CATARACT EXTRACTION W/ INTRAOCULAR LENS  IMPLANT, BILATERAL  2016  . CYSTOSCOPY WITH BIOPSY N/A 07/11/2015   Procedure: CYSTOSCOPY WITH COLD CUP RESECTION AND FULGERATION;  Surgeon: Rana Snare, MD;  Location: [facility];  Service: Urology;  Laterality: N/A;  . HIP ARTHROSCOPY W/ LABRAL DEBRIDEMENT Left 06-29-2000   and chondraplasty  . LUMBAR [place] SURGERY  07-16-2005  . LUMBAR LAMINECTOMY/DECOMPRESSION MICRODISCECTOMY  07/09/2011   Procedure: LUMBAR LAMINECTOMY/DECOMPRESSION MICRODISCECTOMY;  Surgeon: Johnn Hai;  Location: [facility];  Service: Orthopedics;  Laterality: Left;  Decompression L5 - S1 on the Left and repair of dura  . LUMBAR LAMINECTOMY/DECOMPRESSION MICRODISCECTOMY N/A 07/27/2013   Procedure: DECOMPRESSION L4 - L5 WITH EXCISION OF SYNOVIAL CYST AND, LATERAL MASS FUSION 1 LEVEL;  Surgeon: Johnn Hai, MD;  Location: [facility];  Service: Orthopedics;  Laterality: N/A;  . LUMBAR LAMINECTOMY/DECOMPRESSION MICRODISCECTOMY Left 07/06/2014   Procedure:  MICRO LUMBAR DECOMPRESSION L5-S1 ON LEFT, L4-5 REDO;  Surgeon: Johnn Hai, MD;  Location: [facility];  Service: Orthopedics;  Laterality: Left;  . SPINAL CORD STIMULATOR INSERTION N/A 09/11/2016   Procedure: Spinal cord stimulator placement;  Surgeon: Melina Schools, MD;  Location: [facility];  Service: Orthopedics;  Laterality: N/A;  . TONSILLECTOMY AND ADENOIDECTOMY    . TRANSTHORACIC ECHOCARDIOGRAM  09-20-2010   normal LVF, ef 55-65%/  mild MR, TR and PR/  mild LAE  . TRANSURETHRAL RESECTION OF BLADDER TUMOR  09-21-2002    Social History    Socioeconomic History  . Marital status: Widowed    Spouse name: None  . Number of children: 2  . Years of  education: None  . Highest education level: None  Social Needs  . Financial resource strain: None  . Food insecurity - worry: None  . Food insecurity - inability: None  . Transportation needs - medical: None  . Transportation needs - non-medical: None  Occupational History  . Occupation: retired from [employer]: RETIRED  Tobacco Use  . Smoking status: Former Smoker    Packs/day: 2.00    Years: 20.00    Pack years: 40.00    Types: Cigarettes    Last attempt to quit: 10/08/1982    Years since quitting: 34.6  . Smokeless tobacco: Never Used  Substance and Sexual Activity  . Alcohol use: Yes    Comment: rare wine or beer  . Drug use: No  . Sexual activity: Not Currently  Other Topics Concern  . None  Social History Narrative  . None    Family History  Problem Relation Age of Onset  . Heart attack Mother   . Cancer Father        lung  . Cancer Sister        lung  . Diabetes Sister   . Cancer Brother        lung that spread to brain    Review of Systems  Constitutional: Negative for chills and fever.  Eyes: Negative for visual disturbance.  Gastrointestinal: Negative for nausea (none since ED) and vomiting.  Neurological: Positive for headaches. Negative for dizziness, weakness, light-headedness and numbness.       Objective:   Vitals:   06/09/17 1011  BP: 132/66  Pulse: 75  Resp: 16  Temp: 97.9 F (36.6 C)  SpO2: 95%   Filed Weights   06/09/17 1011  Weight: 189 lb (85.7 kg)   Body mass index is 30.51 kg/m.  Wt Readings from Last 3 Encounters:  06/09/17 189 lb (85.7 kg)  06/01/17 190 lb (86.2 kg)  05/21/17 194 lb (88 kg)     Physical Exam  Constitutional: He is oriented to person, place, and time. He appears well-developed and well-nourished. No distress.  HENT:  Head: Normocephalic.  Right Ear: External ear normal.   Left Ear: External ear normal.  Small 1 inch laceration left side of head-2 staples removed, laceration healed, no discharge or surrounding erythema.  Laceration and surrounding area nontender to light palpation  Eyes: Conjunctivae are normal.  Neck: Neck supple. No tracheal deviation present. No thyromegaly present.  Palpable cyst versus lipoma right posterior neck  Musculoskeletal: He exhibits no edema.  Normal strength all extremities  Lymphadenopathy:    He has no cervical adenopathy.  Neurological: He is alert and oriented to person, place, and time. No cranial nerve deficit. Coordination normal.  Normal sensation in all extremities  Skin: Skin is warm and dry. He is not diaphoretic.  Bandaged skin abrasions left elbow  Psychiatric: He has a normal mood and affect. His behavior is normal.          Assessment & Plan:   See Problem List for Assessment and Plan of chronic medical problems.

## 2017-06-08 NOTE — Patient Instructions (Signed)
Pre-Operative Instructions  Congratulations, you have decided to take an important step towards improving your quality of life.  You can be assured that the doctors and staff at [facility] will be with you every step of the way.  Here are some important things you should know:  1. Plan to be at the surgery center/hospital at least 1 (one) hour prior to your scheduled time, unless otherwise directed by the surgical center/hospital staff.  You must have a responsible adult accompany you, remain during the surgery and drive you home.  Make sure you have directions to the surgical center/hospital to ensure you arrive on time. 2. If you are having surgery at [facility] or [facility] hospitals, you will need a copy of your medical history and physical form from your family physician within one month prior to the date of surgery. We will give you a form for your primary physician to complete.  3. We make every effort to accommodate the date you request for surgery.  However, there are times where surgery dates or times have to be moved.  We will contact you as soon as possible if a change in schedule is required.   4. No aspirin/ibuprofen for one week before surgery.  If you are on aspirin, any non-steroidal anti-inflammatory medications (Mobic, Aleve, Ibuprofen) should not be taken seven (7) days prior to your surgery.  You make take Tylenol for pain prior to surgery.  5. Medications - If you are taking daily heart and blood pressure medications, seizure, reflux, allergy, asthma, anxiety, pain or diabetes medications, make sure you notify the surgery center/hospital before the day of surgery so they can tell you which medications you should take or avoid the day of surgery. 6. No food or drink after midnight the night before surgery unless directed otherwise by surgical center/hospital staff. 7. No alcoholic beverages 24-hours prior to surgery.  No smoking 24-hours prior or 24-hours after  surgery. 8. Wear loose pants or shorts. They should be loose enough to fit over bandages, boots, and casts. 9. Don't wear slip-on shoes. Sneakers are preferred. 10. Bring your boot with you to the surgery center/hospital.  Also bring crutches or a walker if your physician has prescribed it for you.  If you do not have this equipment, it will be provided for you after surgery. 11. If you have not been contacted by the surgery center/hospital by the day before your surgery, call to confirm the date and time of your surgery. 12. Leave-time from work may vary depending on the type of surgery you have.  Appropriate arrangements should be made prior to surgery with your employer. 13. Prescriptions will be provided immediately following surgery by your doctor.  Fill these as soon as possible after surgery and take the medication as directed. Pain medications will not be refilled on weekends and must be approved by the doctor. 14. Remove nail polish on the operative foot and avoid getting pedicures prior to surgery. 15. Wash the night before surgery.  The night before surgery wash the foot and leg well with water and the antibacterial soap provided. Be sure to pay special attention to beneath the toenails and in between the toes.  Wash for at least three (3) minutes. Rinse thoroughly with water and dry well with a towel.  Perform this wash unless told not to do so by your physician.  Enclosed: 1 Ice pack (please put in freezer the night before surgery)   1 Hibiclens skin cleaner     Pre-op instructions  If you have any questions regarding the instructions, please do not hesitate to call our office.  [place]: [address], [place], [place] [address] -- [phone]  [place]: [address] [place], [place] [address] -- [phone]  [place]: [address]  [place], [place] [address] -- [phone]  [place]: [address], [place], [place] [address] -- [phone]  Website:  [email or web address] 

## 2017-06-09 ENCOUNTER — Ambulatory Visit (INDEPENDENT_AMBULATORY_CARE_PROVIDER_SITE_OTHER): Payer: Medicare Other | Admitting: Internal Medicine

## 2017-06-09 ENCOUNTER — Encounter: Payer: Self-pay | Admitting: Internal Medicine

## 2017-06-09 VITALS — BP 132/66 | HR 75 | Temp 97.9°F | Resp 16 | Wt 189.0 lb

## 2017-06-09 DIAGNOSIS — S0101XD Laceration without foreign body of scalp, subsequent encounter: Secondary | ICD-10-CM

## 2017-06-09 DIAGNOSIS — R51 Headache: Secondary | ICD-10-CM

## 2017-06-09 DIAGNOSIS — S0101XA Laceration without foreign body of scalp, initial encounter: Secondary | ICD-10-CM | POA: Insufficient documentation

## 2017-06-09 DIAGNOSIS — R519 Headache, unspecified: Secondary | ICD-10-CM | POA: Insufficient documentation

## 2017-06-09 NOTE — Assessment & Plan Note (Signed)
2 staples removed without difficulty.  Laceration healing appropriately without evidence of infection Advised him to wash his hair as normal and not scrub the area

## 2017-06-09 NOTE — Patient Instructions (Addendum)
Your staples were removed today.   Your wound is healing well.    If your headaches do not continue to improve please let us know.     Medications reviewed and updated.  No changes recommended at this time.

## 2017-06-09 NOTE — Assessment & Plan Note (Signed)
CT of the head in the emergency room negative for any acute bleed Headaches likely related to mild concussion.  No other concerning symptoms Headache successfully treated with Tylenol and improving He will continue to take Tylenol If his headaches do not continue to improve and resolve completely he will let us know

## 2017-06-10 NOTE — Progress Notes (Signed)
Subjective:    Patient ID: [name], male   DOB: 81 y.o.   MRN: [id number]   HPI patient states that his big toe joint has been very sore and it's making it hard for him to wear shoe gear comfortably and his nails are elongated he cannot cut them and they become painful    ROS      Objective:  Physical Exam neurovascular status intact with inflammation pain around the big toe joint right with diminished range of motion crepitus and fluid buildup around the joint surface. Patient's also noted to have thickened nailbeds 1-5 both feet that are yellow brittle and painful     Assessment:    Hallux rigidus condition right with arthritis and chronic pain and structural changes with mycotic nail infection 1-5 both feet     Plan:    H&P condition reviewed with patient. At this point I allowed patient to read consent form for surgery going over procedure alternative treatments and complications. Patient wants surgery understanding all risk and at this time signs consent form after extensive review and is scheduled for outpatient surgery. I debrided nailbeds 1-5 both feet with no iatrogenic bleeding and patient be seen back as needed understanding total surgical recovery will be 6 months to one year

## 2017-06-16 ENCOUNTER — Encounter: Payer: Self-pay | Admitting: Podiatry

## 2017-06-16 DIAGNOSIS — M2021 Hallux rigidus, right foot: Secondary | ICD-10-CM

## 2017-06-16 DIAGNOSIS — E7800 Pure hypercholesterolemia, unspecified: Secondary | ICD-10-CM | POA: Diagnosis not present

## 2017-06-16 DIAGNOSIS — M21611 Bunion of right foot: Secondary | ICD-10-CM | POA: Diagnosis not present

## 2017-06-24 ENCOUNTER — Encounter: Payer: Self-pay | Admitting: Podiatry

## 2017-06-24 ENCOUNTER — Ambulatory Visit (INDEPENDENT_AMBULATORY_CARE_PROVIDER_SITE_OTHER): Payer: Medicare Other | Admitting: Podiatry

## 2017-06-24 ENCOUNTER — Ambulatory Visit (INDEPENDENT_AMBULATORY_CARE_PROVIDER_SITE_OTHER): Payer: Medicare Other

## 2017-06-24 DIAGNOSIS — M205X1 Other deformities of toe(s) (acquired), right foot: Secondary | ICD-10-CM | POA: Diagnosis not present

## 2017-06-24 DIAGNOSIS — M779 Enthesopathy, unspecified: Secondary | ICD-10-CM | POA: Diagnosis not present

## 2017-06-25 ENCOUNTER — Telehealth: Payer: Self-pay | Admitting: Podiatry

## 2017-06-25 ENCOUNTER — Telehealth: Payer: Self-pay | Admitting: *Deleted

## 2017-06-25 NOTE — Telephone Encounter (Signed)
I came up and saw Dr. Paulla Dolly yesterday. The nurse gave me the wrong size shoe. She gave me a medium and I need a large. I was wondering if I could come back up there today and change it. My number is [phone]. Thank you.

## 2017-06-25 NOTE — Telephone Encounter (Signed)
S. Durant states pt was given a medium darco, and she dispensed a larger darco shoe today.

## 2017-06-25 NOTE — Progress Notes (Signed)
Subjective:   Patient ID: [name], male   DOB: 81 y.o.   MRN: [id number]   HPI Patient presents stating he is doing very well post surgery and is having minimal discomfort.   ROS      Objective:  Physical Exam  Neurovascular status intact the patient's right first MPJ excellent with no crepitus of the joint surface in good alignment with wound edges well coapted.     Assessment:  At this point patient will continue weightbearing continue immobilization compression elevation and reappoint 3 weeks or earlier if needed.     Plan:  X-rays reviewed and discussed how well he is doing with excellent range of motion and patient was rebounded again with sterile dressing continue elevation compression and immobilization reappoint to recheck.  X-rays indicate that the bone structure looks good the implant is in place with good alignment

## 2017-07-03 ENCOUNTER — Other Ambulatory Visit: Payer: Self-pay | Admitting: Internal Medicine

## 2017-07-06 ENCOUNTER — Other Ambulatory Visit: Payer: Self-pay | Admitting: Internal Medicine

## 2017-07-13 ENCOUNTER — Other Ambulatory Visit: Payer: Self-pay | Admitting: Internal Medicine

## 2017-07-15 ENCOUNTER — Other Ambulatory Visit: Payer: Self-pay | Admitting: Podiatry

## 2017-07-15 ENCOUNTER — Ambulatory Visit (INDEPENDENT_AMBULATORY_CARE_PROVIDER_SITE_OTHER): Payer: Medicare Other | Admitting: Podiatry

## 2017-07-15 ENCOUNTER — Ambulatory Visit: Payer: Medicare Other

## 2017-07-15 ENCOUNTER — Encounter: Payer: Self-pay | Admitting: Podiatry

## 2017-07-15 ENCOUNTER — Ambulatory Visit (INDEPENDENT_AMBULATORY_CARE_PROVIDER_SITE_OTHER): Payer: Medicare Other

## 2017-07-15 DIAGNOSIS — L02619 Cutaneous abscess of unspecified foot: Secondary | ICD-10-CM | POA: Diagnosis not present

## 2017-07-15 DIAGNOSIS — M205X1 Other deformities of toe(s) (acquired), right foot: Secondary | ICD-10-CM

## 2017-07-15 DIAGNOSIS — L03119 Cellulitis of unspecified part of limb: Secondary | ICD-10-CM | POA: Diagnosis not present

## 2017-07-15 MED ORDER — CEPHALEXIN 500 MG PO CAPS: 500.0000 mg | ORAL_CAPSULE | Freq: Three times a day (TID) | ORAL | 1 refills | Status: DC

## 2017-07-16 NOTE — Progress Notes (Signed)
Subjective:   Patient ID: [name], male   DOB: 82 y.o.   MRN: [id number]   HPI Patient presents stating that he is just developed a little bit of redness on top and a little bit of seepage and he want to make sure everything was okay.  States is only been over the last few days   ROS      Objective:  Physical Exam  Neurovascular status intact with patient showing slight irritation around the proximal incision site right first metatarsal around where the knot is for the stitch.  The range of motion of the joint is excellent with no pain noted and there is no proximal edema erythema or drainage or no systemic indication of infection     Assessment:  Reviewed condition and there could be a very localized superficial infection process     Plan:  Reviewed condition with patient and x-ray and is precautionary measure and placing him on an antibiotic cephalexin 500 mg 3 times daily instructed on soaks monitoring it carefully and if any changes were to occur he is to let us know immediately  X-rays indicate implant is in place with no indications of pathology

## 2017-07-21 DIAGNOSIS — D692 Other nonthrombocytopenic purpura: Secondary | ICD-10-CM | POA: Diagnosis not present

## 2017-07-21 DIAGNOSIS — D225 Melanocytic nevi of trunk: Secondary | ICD-10-CM | POA: Diagnosis not present

## 2017-07-21 DIAGNOSIS — L570 Actinic keratosis: Secondary | ICD-10-CM | POA: Diagnosis not present

## 2017-07-21 DIAGNOSIS — L720 Epidermal cyst: Secondary | ICD-10-CM | POA: Diagnosis not present

## 2017-07-22 ENCOUNTER — Ambulatory Visit (INDEPENDENT_AMBULATORY_CARE_PROVIDER_SITE_OTHER): Payer: Medicare Other | Admitting: Podiatry

## 2017-07-22 ENCOUNTER — Encounter: Payer: Self-pay | Admitting: Podiatry

## 2017-07-22 ENCOUNTER — Ambulatory Visit (INDEPENDENT_AMBULATORY_CARE_PROVIDER_SITE_OTHER): Payer: Medicare Other

## 2017-07-22 DIAGNOSIS — L03119 Cellulitis of unspecified part of limb: Secondary | ICD-10-CM

## 2017-07-22 DIAGNOSIS — M205X1 Other deformities of toe(s) (acquired), right foot: Secondary | ICD-10-CM | POA: Diagnosis not present

## 2017-07-22 DIAGNOSIS — L02619 Cutaneous abscess of unspecified foot: Secondary | ICD-10-CM | POA: Diagnosis not present

## 2017-07-24 NOTE — Progress Notes (Signed)
Subjective:   Patient ID: [name], male   DOB: 82 y.o.   MRN: [id number]   HPI Patient presents stating doing much better with diminished pain and redness and no drainage   ROS      Objective:  Physical Exam  Neurovascular status intact with patient's right foot healing well with wound edges well coapted good range of motion with no crepitus of the joint     Assessment:  Doing well post implant right with slight drainage that was previous but improved now     Plan:  Reviewed and advised on continued soaks finishing antibiotics and reviewed x-rays indicating good alignment  X-rays indicate that the implant is in place with no signs of ostial lysis or any kind of other pathology

## 2017-07-27 ENCOUNTER — Other Ambulatory Visit: Payer: Self-pay | Admitting: Internal Medicine

## 2017-07-28 ENCOUNTER — Other Ambulatory Visit: Payer: Self-pay | Admitting: Internal Medicine

## 2017-07-30 DIAGNOSIS — C672 Malignant neoplasm of lateral wall of bladder: Secondary | ICD-10-CM | POA: Diagnosis not present

## 2017-08-14 NOTE — Progress Notes (Signed)
DOS 06/16/17 [place] bunionectomy w possible implant Rt. 1st MPT

## 2017-08-26 ENCOUNTER — Ambulatory Visit (INDEPENDENT_AMBULATORY_CARE_PROVIDER_SITE_OTHER): Payer: Medicare Other | Admitting: Podiatry

## 2017-08-26 ENCOUNTER — Ambulatory Visit (INDEPENDENT_AMBULATORY_CARE_PROVIDER_SITE_OTHER): Payer: Medicare Other

## 2017-08-26 ENCOUNTER — Encounter: Payer: Self-pay | Admitting: Podiatry

## 2017-08-26 DIAGNOSIS — M2012 Hallux valgus (acquired), left foot: Secondary | ICD-10-CM

## 2017-08-26 NOTE — Progress Notes (Signed)
Subjective:   Patient ID: [name], male   DOB: 82 y.o.   MRN: [id number]   HPI Patient states doing much better with minimal discomfort and able to walk without pain currently   ROS      Objective:  Physical Exam  Neurovascular status intact with good range of motion first MPJ with no crepitus of the joint and excellent movement currently     Assessment:  Doing very well post implant surgery     Plan:  Advised on physical therapy anti-inflammatories supportive shoes and patient is discharged unless needs to be seen again  X-rays indicate that the implant is healing well with good alignment and no signs of significant pathology

## 2017-09-03 ENCOUNTER — Emergency Department (HOSPITAL_COMMUNITY): Payer: Medicare Other

## 2017-09-03 ENCOUNTER — Emergency Department (HOSPITAL_COMMUNITY)
Admission: EM | Admit: 2017-09-03 | Discharge: 2017-09-04 | Disposition: A | Discharge status: Home / Self Care | Payer: Medicare Other | Attending: Emergency Medicine | Admitting: Emergency Medicine

## 2017-09-03 ENCOUNTER — Other Ambulatory Visit: Payer: Self-pay

## 2017-09-03 ENCOUNTER — Encounter (HOSPITAL_COMMUNITY): Payer: Self-pay

## 2017-09-03 DIAGNOSIS — R112 Nausea with vomiting, unspecified: Secondary | ICD-10-CM

## 2017-09-03 DIAGNOSIS — I509 Heart failure, unspecified: Secondary | ICD-10-CM | POA: Diagnosis not present

## 2017-09-03 DIAGNOSIS — R062 Wheezing: Secondary | ICD-10-CM | POA: Diagnosis not present

## 2017-09-03 DIAGNOSIS — R079 Chest pain, unspecified: Secondary | ICD-10-CM

## 2017-09-03 DIAGNOSIS — I2510 Atherosclerotic heart disease of native coronary artery without angina pectoris: Secondary | ICD-10-CM | POA: Insufficient documentation

## 2017-09-03 DIAGNOSIS — Z8551 Personal history of malignant neoplasm of bladder: Secondary | ICD-10-CM | POA: Diagnosis not present

## 2017-09-03 DIAGNOSIS — I700 Atherosclerosis of aorta: Secondary | ICD-10-CM | POA: Diagnosis not present

## 2017-09-03 DIAGNOSIS — R0789 Other chest pain: Secondary | ICD-10-CM | POA: Diagnosis not present

## 2017-09-03 DIAGNOSIS — R404 Transient alteration of awareness: Secondary | ICD-10-CM | POA: Diagnosis not present

## 2017-09-03 DIAGNOSIS — I10 Essential (primary) hypertension: Secondary | ICD-10-CM | POA: Diagnosis not present

## 2017-09-03 DIAGNOSIS — M069 Rheumatoid arthritis, unspecified: Secondary | ICD-10-CM | POA: Insufficient documentation

## 2017-09-03 DIAGNOSIS — Z87891 Personal history of nicotine dependence: Secondary | ICD-10-CM | POA: Insufficient documentation

## 2017-09-03 DIAGNOSIS — Z91040 Latex allergy status: Secondary | ICD-10-CM | POA: Insufficient documentation

## 2017-09-03 DIAGNOSIS — Z79899 Other long term (current) drug therapy: Secondary | ICD-10-CM | POA: Insufficient documentation

## 2017-09-03 DIAGNOSIS — R42 Dizziness and giddiness: Secondary | ICD-10-CM | POA: Diagnosis not present

## 2017-09-03 DIAGNOSIS — R072 Precordial pain: Secondary | ICD-10-CM | POA: Diagnosis not present

## 2017-09-03 DIAGNOSIS — R197 Diarrhea, unspecified: Secondary | ICD-10-CM | POA: Diagnosis not present

## 2017-09-03 DIAGNOSIS — R0602 Shortness of breath: Secondary | ICD-10-CM | POA: Diagnosis not present

## 2017-09-03 LAB — CBC WITH DIFFERENTIAL/PLATELET
Basophils Absolute: 0 10*3/uL (ref 0.0–0.1)
Basophils Relative: 0 %
Eosinophils Absolute: 0.1 10*3/uL (ref 0.0–0.7)
Eosinophils Relative: 1 %
HCT: 43.1 % (ref 39.0–52.0)
Hemoglobin: 14.6 g/dL (ref 13.0–17.0)
LYMPHS ABS: 0.6 10*3/uL — AB (ref 0.7–4.0)
LYMPHS PCT: 8 %
MCH: 32.2 pg (ref 26.0–34.0)
MCHC: 33.9 g/dL (ref 30.0–36.0)
MCV: 94.9 fL (ref 78.0–100.0)
MONO ABS: 0.2 10*3/uL (ref 0.1–1.0)
MONOS PCT: 3 %
Neutro Abs: 6.1 10*3/uL (ref 1.7–7.7)
Neutrophils Relative %: 88 %
PLATELETS: 179 10*3/uL (ref 150–400)
RBC: 4.54 MIL/uL (ref 4.22–5.81)
RDW: 12.7 % (ref 11.5–15.5)
WBC: 7 10*3/uL (ref 4.0–10.5)

## 2017-09-03 LAB — COMPREHENSIVE METABOLIC PANEL
ALT: 35 U/L (ref 17–63)
AST: 27 U/L (ref 15–41)
Albumin: 3.9 g/dL (ref 3.5–5.0)
Alkaline Phosphatase: 85 U/L (ref 38–126)
Anion gap: 8 (ref 5–15)
BUN: 15 mg/dL (ref 6–20)
CO2: 25 mmol/L (ref 22–32)
Calcium: 8.7 mg/dL — ABNORMAL LOW (ref 8.9–10.3)
Chloride: 105 mmol/L (ref 101–111)
Creatinine, Ser: 0.88 mg/dL (ref 0.61–1.24)
Glucose, Bld: 99 mg/dL (ref 65–99)
Potassium: 4.2 mmol/L (ref 3.5–5.1)
Sodium: 138 mmol/L (ref 135–145)
Total Bilirubin: 0.7 mg/dL (ref 0.3–1.2)
Total Protein: 6.4 g/dL — ABNORMAL LOW (ref 6.5–8.1)

## 2017-09-03 LAB — I-STAT TROPONIN, ED: Troponin i, poc: 0 ng/mL (ref 0.00–0.08)

## 2017-09-03 LAB — I-STAT CG4 LACTIC ACID, ED
LACTIC ACID, VENOUS: 1.21 mmol/L (ref 0.5–1.9)
Lactic Acid, Venous: 0.93 mmol/L (ref 0.5–1.9)

## 2017-09-03 LAB — LIPASE, BLOOD: LIPASE: 32 U/L (ref 11–51)

## 2017-09-03 MED ORDER — ALBUTEROL SULFATE HFA 108 (90 BASE) MCG/ACT IN AERS
2.0000 | INHALATION_SPRAY | Freq: Once | RESPIRATORY_TRACT | Status: AC
  Administered 2017-09-03: 2 via RESPIRATORY_TRACT
  Filled 2017-09-03: qty 6.7

## 2017-09-03 MED ORDER — ONDANSETRON HCL 4 MG/2ML IJ SOLN
4.0000 mg | Freq: Once | INTRAMUSCULAR | Status: AC
  Administered 2017-09-03: 4 mg via INTRAVENOUS
  Filled 2017-09-03: qty 2

## 2017-09-03 MED ORDER — ONDANSETRON 4 MG PO TBDP: 4.0000 mg | ORAL_TABLET | Freq: Three times a day (TID) | ORAL | 0 refills | Status: AC | PRN

## 2017-09-03 MED ORDER — SODIUM CHLORIDE 0.9 % IV BOLUS (SEPSIS)
500.0000 mL | Freq: Once | INTRAVENOUS | Status: AC
  Administered 2017-09-03: 500 mL via INTRAVENOUS

## 2017-09-03 MED ORDER — SODIUM CHLORIDE 0.9 % IV BOLUS (SEPSIS)
1000.0000 mL | Freq: Once | INTRAVENOUS | Status: AC
  Administered 2017-09-03: 1000 mL via INTRAVENOUS

## 2017-09-03 NOTE — ED Notes (Addendum)
EKG and blood draw delayed.  Pt currently in xray.

## 2017-09-03 NOTE — ED Provider Notes (Signed)
[facility] DEPT Provider Note  CSN: [id number] Arrival date & time: 09/03/17 1654  Chief Complaint(s) Emesis; Diarrhea; and Chest Pain  HPI [name] is a 82 y.o. male   The history is provided by the patient.  GI Problem  This is a new problem. Episode onset: 15 hrs. The problem occurs hourly. The problem has not changed since onset.Associated symptoms include chest pain and shortness of breath. Pertinent negatives include no abdominal pain and no headaches. Exacerbated by: eating. Nothing relieves the symptoms. Treatments tried: immodium. The treatment provided no relief.  Chest Pain   This is a recurrent (states that it feel like this when he gets "dehydrated") problem. The current episode started 3 to 5 hours ago. The problem occurs constantly. The problem has been gradually improving. Associated with: N/V/D. The pain is present in the substernal region. The pain is moderate. The quality of the pain is described as pressure-like. Associated symptoms include nausea and shortness of breath. Pertinent negatives include no abdominal pain, no cough, no diaphoresis, no fever, no headaches, no leg pain, no lower extremity edema and no malaise/fatigue. Risk factors include male gender and being elderly.  His past medical history is significant for CAD (mild dz noted in 2010), CHF (but ECHO in 2017 showed normal function), hyperlipidemia and hypertension.  Pertinent negatives for past medical history include no diabetes, no DVT, no MI, no PE and no strokes.  Procedure history is positive for cardiac catheterization (2010 with mild CAD).   States that he ate [place] sandwich several hours before symptoms started.  Past Medical History Past Medical History:  Diagnosis Date  . Acute bronchitis per pt no fever--  cough since discharge from hospital 07-02-2015   per pcp note 07-05-2015  mild to moderate bronchitis vs pna  . Allergic rhinitis   . Bladder cancer ([place])    . BPH (benign prostatic hyperplasia)   . Bradycardia   . CAD (coronary artery disease)   . CHF (congestive heart failure) ([place])   . Chronic low back pain   . DDD (degenerative disc disease), lumbosacral   . Depression   . Diverticulosis of colon (without mention of hemorrhage)   . Dyspnea   . Essential tremor   . Fatty liver disease, nonalcoholic   . GERD (gastroesophageal reflux disease)   . Glaucoma   . History of bladder cancer    2004--  TCC low-grade , non-invasive  . History of colon polyps   . History of exercise stress test    02-05-2007-- ETT (Clinically positive, electrically equivocal, submaximal ETT,  appropriate bp response to exercise  . History of hiatal hernia   . History of kidney stones   . History of peptic ulcer   . HLD (hyperlipidemia)   . HTN (hypertension)   . Nocturia   . Productive cough   . Rheumatoid arthritis(714.0)   . Right renal artery stenosis ([facility])    per cath 03-14-2009 --- 40-50%  . RLS (restless legs syndrome)   . Vertigo, intermittent   . Weak urinary stream   . Wears dentures    upper  . Wears glasses   . Wears hearing aid    bilateral  . Wears partial dentures    lower   Patient Active Problem List   Diagnosis Date Noted  . Laceration of scalp without foreign body 06/09/2017  . Nonintractable headache 06/09/2017  . Acute sinus infection 04/14/2017  . Nausea and vomiting 03/27/2017  . Hyponatremia 03/17/2017  .  Right lumbar radiculopathy 01/21/2017  . Fall 01/21/2017  . Synovial cyst 01/21/2017  . Chronic pain 09/11/2016  . Generalized weakness 05/22/2016  . UTI (urinary tract infection) 05/22/2016  . Weakness 05/22/2016  . Fever blister 10/18/2015  . Dyspnea 10/17/2015  . Bradycardia 09/17/2015  . Chest pain at rest 09/17/2015  . CAD in native artery 09/17/2015  . CHF (congestive heart failure) ([place]) 09/17/2015  . Pulmonary hypertension ([place]) 09/17/2015  . Bladder cancer ([place]) 09/17/2015  . Essential tremor  09/17/2015  . Chest pain 09/17/2015  . Anginal chest pain at rest [facility]) 09/17/2015  . Hypokalemia 07/13/2015  . Vertigo 07/01/2015  . Dizzinesses 07/01/2015  . Spinal stenosis of lumbar region at multiple levels 07/06/2014  . Angular cheilitis 05/05/2014  . Left lumbar radiculopathy 05/04/2014  . Right elbow pain 12/29/2013  . Lateral epicondylitis of right elbow 12/29/2013  . Cough 09/14/2013  . Spinal stenosis, lumbar 07/27/2013  . Palpable mass of neck 07/21/2013  . Vasovagal episode 01/10/2013  . Left otitis media 10/28/2012  . Impaired glucose tolerance 10/28/2012  . Cellulitis 04/24/2012  . Hemorrhoids 04/24/2012  . Syncope 04/21/2012  . Preventative health care 11/28/2010  . DEPRESSION 09/12/2010  . RENAL ARTERY STENOSIS 09/12/2010  . FATIGUE 09/12/2010  . BUNION, RIGHT FOOT 02/05/2010  . RECTAL BLEEDING 07/10/2009  . Diarrhea 07/10/2009  . DIVERTICULOSIS OF COLON 03/28/2009  . FATTY LIVER DISEASE 03/28/2009  . BRADYCARDIA 03/20/2009  . DIZZINESS 10/17/2008  . CHEST PAIN 10/17/2008  . Malignant neoplasm of bladder ([place]) 02/07/2008  . ALLERGIC RHINITIS 02/07/2008  . COLONIC POLYPS, HX OF 02/07/2008  . Hyperlipidemia 11/24/2007  . Essential hypertension 11/24/2007  . GERD 11/24/2007  . HIATAL HERNIA WITH REFLUX 11/24/2007  . RENAL INSUFFICIENCY 11/24/2007  . Rheumatoid arthritis(714.0) 11/24/2007  . [place] DISEASE, LUMBAR 11/24/2007  . ADENOIDECTOMY, HX OF 11/24/2007   Home Medication(s) Prior to Admission medications   Medication Sig Start Date End Date Taking? Authorizing Provider  acetaminophen (TYLENOL) 650 MG CR tablet Take 1,300 mg by mouth at bedtime as needed for pain.    [provider]  amLODipine (NORVASC) 5 MG tablet TAKE 1 TABLET EVERY DAY 07/08/17   Biagio Borg, MD  brimonidine (ALPHAGAN) 0.15 % ophthalmic solution Place 1 drop into both eyes 2 (two) times daily.    [provider]  cephALEXin (KEFLEX) 500 MG capsule Take 1  capsule (500 mg total) by mouth 3 (three) times daily. 07/15/17   Wallene Huh, DPM  citalopram (CELEXA) 10 MG tablet TAKE 1 TABLET EVERY MORNING 05/13/17   Biagio Borg, MD  clotrimazole (LOTRIMIN) 1 % cream Apply 1 application topically 2 (two) times daily as needed (FOR RASH). Prescribed for rash in groin    [provider]  fluticasone (FLONASE) 50 MCG/ACT nasal spray USE 2 SPRAYS IN EACH NOSTRIL EVERY DAY AS NEEDED  FOR  RHINITIS/ALLERGIES 07/08/17   Biagio Borg, MD  gabapentin (NEURONTIN) 300 MG capsule TAKE 1 TO 2 CAPSULES FOUR TIMES DAILY  AFTER MEALS AND AT BEDTIME. 03/04/17   Biagio Borg, MD  hydrochlorothiazide (HYDRODIURIL) 25 MG tablet TAKE 1 TABLET EVERY DAY 05/13/17   Biagio Borg, MD  hydroxypropyl methylcellulose / hypromellose (ISOPTO TEARS / GONIOVISC) 2.5 % ophthalmic solution Place 1 drop into both eyes 2 (two) times daily.    [provider]  losartan (COZAAR) 50 MG tablet TAKE 1 TABLET EVERY DAY 07/08/17   Biagio Borg, MD  meclizine (ANTIVERT) 25 MG tablet  Take 1 tablet (25 mg total) by mouth 3 (three) times daily as needed for dizziness. 04/14/17   Biagio Borg, MD  methocarbamol (ROBAXIN) 500 MG tablet Take 1 tablet (500 mg total) by mouth 3 (three) times daily as needed for muscle spasms. 09/11/16   Melina Schools, MD  nitroGLYCERIN (NITROSTAT) 0.4 MG SL tablet Place 1 tablet (0.4 mg total) under the tongue every 5 (five) minutes as needed. Chest pain 12/14/16   Biagio Borg, MD  Nutritional Supplements (JUICE PLUS FIBRE PO) Take 1 each 2 (two) times daily by mouth.    [provider]  omeprazole (PRILOSEC) 40 MG capsule Take 1 capsule by mouth twice daily. 04/10/17   Zehr, Janett Billow D, PA-C  ondansetron (ZOFRAN) 4 MG tablet Take 1 tablet (4 mg total) by mouth every 8 (eight) hours as needed for nausea or vomiting. 09/11/16   Melina Schools, MD  potassium chloride (K-DUR,KLOR-CON) 10 MEQ tablet TAKE 2 TABLETS EVERY DAY 05/13/17   Biagio Borg, MD    primidone (MYSOLINE) 250 MG tablet TAKE 1/2 TABLET THREE TIMES DAILY 07/28/17   Biagio Borg, MD  simvastatin (ZOCOR) 40 MG tablet TAKE 1 AND 1/2 TABLETS AT BEDTIME 07/15/17   Biagio Borg, MD  tamsulosin (FLOMAX) 0.4 MG CAPS capsule Take 1 capsule (0.4 mg total) by mouth daily. Patient taking differently: Take 0.8 mg by mouth at bedtime.  05/17/15   Biagio Borg, MD  timolol (TIMOPTIC) 0.5 % ophthalmic solution Place 1 drop into both eyes 2 (two) times daily.  05/30/15   [provider]  Travoprost, BAK Free, (TRAVATAN Z) 0.004 % SOLN ophthalmic solution Place 1 drop into both eyes at bedtime. 09/14/13   Biagio Borg, MD  triamcinolone (KENALOG) 0.025 % cream Apply 1 application topically 2 (two) times daily as needed (rash).     [provider]  VENTOLIN HFA 108 (90 Base) MCG/ACT inhaler INHALE 2 PUFFS INTO THE LUNGS EVERY 6 HOURS AS NEEDED FOR WHEEZING OR SHORTNESS OF BREATH 07/28/17   Biagio Borg, MD                                                                                                                                    Past Surgical History Past Surgical History:  Procedure Laterality Date  . CARDIAC CATHETERIZATION  02-12-2007  dr gregg taylor   Non-obstructive CAD,  20% pLCX,  20% LAD, ef 65%  . CARDIAC CATHETERIZATION  03-14-2009  dr Johnsie Cancel   pLAD and mid diaginal 20-30% multiple lesions,  OM1 20%, right renal ostial 40-50%,  ef 60%  . CATARACT EXTRACTION W/ INTRAOCULAR LENS  IMPLANT, BILATERAL  2016  . CYSTOSCOPY WITH BIOPSY N/A 07/11/2015   Procedure: CYSTOSCOPY WITH COLD CUP RESECTION AND FULGERATION;  Surgeon: Rana Snare, MD;  Location: [facility];  Service: Urology;  Laterality: N/A;  . HIP ARTHROSCOPY W/ LABRAL DEBRIDEMENT Left  06-29-2000   and chondraplasty  . LUMBAR [place] SURGERY  07-16-2005  . LUMBAR LAMINECTOMY/DECOMPRESSION MICRODISCECTOMY  07/09/2011   Procedure: LUMBAR LAMINECTOMY/DECOMPRESSION MICRODISCECTOMY;  Surgeon: Johnn Hai;  Location: [facility];  Service: Orthopedics;  Laterality: Left;  Decompression L5 - S1 on the Left and repair of dura  . LUMBAR LAMINECTOMY/DECOMPRESSION MICRODISCECTOMY N/A 07/27/2013   Procedure: DECOMPRESSION L4 - L5 WITH EXCISION OF SYNOVIAL CYST AND, LATERAL MASS FUSION 1 LEVEL;  Surgeon: Johnn Hai, MD;  Location: [facility];  Service: Orthopedics;  Laterality: N/A;  . LUMBAR LAMINECTOMY/DECOMPRESSION MICRODISCECTOMY Left 07/06/2014   Procedure:  MICRO LUMBAR DECOMPRESSION L5-S1 ON LEFT, L4-5 REDO;  Surgeon: Johnn Hai, MD;  Location: [facility];  Service: Orthopedics;  Laterality: Left;  . SPINAL CORD STIMULATOR INSERTION N/A 09/11/2016   Procedure: Spinal cord stimulator placement;  Surgeon: Melina Schools, MD;  Location: [facility];  Service: Orthopedics;  Laterality: N/A;  . TONSILLECTOMY AND ADENOIDECTOMY    . TRANSTHORACIC ECHOCARDIOGRAM  09-20-2010   normal LVF, ef 55-65%/  mild MR, TR and PR/  mild LAE  . TRANSURETHRAL RESECTION OF BLADDER TUMOR  09-21-2002   Family History Family History  Problem Relation Age of Onset  . Heart attack Mother   . Cancer Father        lung  . Cancer Sister        lung  . Diabetes Sister   . Cancer Brother        lung that spread to brain    Social History Social History   Tobacco Use  . Smoking status: Former Smoker    Packs/day: 2.00    Years: 20.00    Pack years: 40.00    Types: Cigarettes    Last attempt to quit: 10/08/1982    Years since quitting: 34.9  . Smokeless tobacco: Never Used  Substance Use Topics  . Alcohol use: Yes    Comment: rare wine or beer  . Drug use: No   Allergies Morphine and related; Hydrochlorothiazide; Tape; Latex; Lisinopril; and Sulfa antibiotics  Review of Systems Review of Systems  Constitutional: Negative for diaphoresis, fever and malaise/fatigue.  Respiratory: Positive for shortness of breath. Negative for cough.   Cardiovascular: Positive for chest pain.  Gastrointestinal: Positive for  nausea. Negative for abdominal pain.  Neurological: Negative for headaches.   All other systems are reviewed and are negative for acute change except as noted in the HPI  Physical Exam Vital Signs  I have reviewed the triage vital signs BP (!) 141/72 (BP Location: Left Arm)   Pulse 71   Temp (!) 100.7 F (38.2 C) (Oral)   Resp 16   Ht 5\' 7"  (1.702 m)   Wt 86.2 kg (190 lb)   SpO2 94%   BMI 29.76 kg/m   Physical Exam  Constitutional: He is oriented to person, place, and time. He appears well-developed and well-nourished. No distress.  HENT:  Head: Normocephalic and atraumatic.  Nose: Nose normal.  Eyes: Conjunctivae and EOM are normal. Pupils are equal, round, and reactive to light. Right eye exhibits no discharge. Left eye exhibits no discharge. No scleral icterus.  Neck: Normal range of motion. Neck supple.  Cardiovascular: Normal rate and regular rhythm. Exam reveals no gallop and no friction rub.  No murmur heard. Pulmonary/Chest: Effort normal. No stridor. No respiratory distress. He has wheezes (diffuse). He has no rales.  Abdominal: Soft. He exhibits no distension. There is no tenderness. There is no rigidity, no rebound, no  guarding and no CVA tenderness.  Musculoskeletal: He exhibits no edema or tenderness.  Neurological: He is alert and oriented to person, place, and time.  Skin: Skin is warm and dry. No rash noted. He is not diaphoretic. No erythema.  Psychiatric: He has a normal mood and affect.  Vitals reviewed.   ED Results and Treatments Labs (all labs ordered are listed, but only abnormal results are displayed) Labs Reviewed  CBC WITH DIFFERENTIAL/PLATELET - Abnormal; Notable for the following components:      Result Value   Lymphs Abs 0.6 (*)    All other components within normal limits  COMPREHENSIVE METABOLIC PANEL - Abnormal; Notable for the following components:   Calcium 8.7 (*)    Total Protein 6.4 (*)    All other components within normal limits    GASTROINTESTINAL PANEL BY PCR, STOOL (REPLACES STOOL CULTURE)  LIPASE, BLOOD  I-STAT CG4 LACTIC ACID, ED  I-STAT TROPONIN, ED  I-STAT CG4 LACTIC ACID, ED                                                                                                                         EKG  EKG Interpretation  Date/Time:  Thursday September 03 2017 17:07:47 EST Ventricular Rate:  69 PR Interval:    QRS Duration: 86 QT Interval:  368 QTC Calculation: 395 R Axis:   46 Text Interpretation:  Sinus rhythm Minimal ST depression, inferior leads No significant change since last tracing Confirmed by Addison Lank [phone]) on 09/03/2017 7:39:32 PM      Radiology Dg Chest 2 View  Result Date: 09/03/2017 CLINICAL DATA:  Wheezing EXAM: CHEST  2 VIEW COMPARISON:  None. FINDINGS: Top-normal size heart. Moderate aortic atherosclerosis. Neural stimulating leads project over the mid thoracic spine. Lungs are clear without pneumonic consolidation. No pulmonary edema. No acute osseous abnormality. IMPRESSION: No active pulmonary disease.  Aortic atherosclerosis. Electronically Signed   By: Ashley Royalty M.D.   On: 09/03/2017 19:09   Pertinent labs & imaging results that were available during my care of the patient were reviewed by me and considered in my medical decision making (see chart for details).  Medications Ordered in ED Medications  sodium chloride 0.9 % bolus 500 mL (not administered)  albuterol (PROVENTIL HFA;VENTOLIN HFA) 108 (90 Base) MCG/ACT inhaler 2 puff (not administered)  sodium chloride 0.9 % bolus 1,000 mL (1,000 mLs Intravenous New Bag/Given 09/03/17 1855)  ondansetron (ZOFRAN) injection 4 mg (4 mg Intravenous Given 09/03/17 1908)  Procedures Procedures  (including critical care time)  Medical Decision Making / ED Course I have reviewed the nursing notes for this  encounter and the patient's prior records (if available in EHR or on provided paperwork).    82 y.o. male presents with vomiting, diarrhea, and fever for 1 day. Possible suspicious food intake. No historical evidence to suggest suspicious toxic ingestion or exposure. decreased oral tolerance. Rest of history as above.  Patient appears well, not in distress, and with no signs of toxicity or dehydration. Abdomen benign. Rest of the exam as above  Most consistent with viral gastroenteritis vs food poisoning.   Doubt appendicitis, diverticulitis, severe colitis, dysentery.    Patient given IV fluids with significant improvement in his symptomatology.  Chest pain resolved.  EKG without acute ischemic changes or evidence of pericarditis.  Troponin negative.  Low suspicion for ACS, PE, dissection. Chest x-ray without evidence suggestive of pneumonia, pneumothorax, pneumomediastinum.  No abnormal contour of the mediastinum to suggest dissection. No evidence of acute injuries.  Able to tolerate oral intake in the ED.  Discussed symptomatic treatment with the patient and they will follow closely with their PCP.    Final Clinical Impression(s) / ED Diagnoses Final diagnoses:  Wheezing  Atypical chest pain  Nausea vomiting and diarrhea   Disposition: Discharge  Condition: Good  I have discussed the results, Dx and Tx plan with the patient and family who expressed understanding and agree(s) with the plan. Discharge instructions discussed at great length. The patient and family was given strict return precautions who verbalized understanding of the instructions. No further questions at time of discharge.    ED Discharge Orders        Ordered    ondansetron (ZOFRAN ODT) 4 MG disintegrating tablet  Every 8 hours PRN     09/03/17 2214       Follow Up: Biagio Borg, MD [place]  [id number] [phone]   in 3-5 days, If symptoms do not improve or   worsen       This chart was dictated using voice recognition software.  Despite best efforts to proofread,  errors can occur which can change the documentation meaning.   Fatima Blank, MD 09/03/17 2214

## 2017-09-03 NOTE — ED Triage Notes (Signed)
Patient reports that he began having N/V/D since 0300 today. Patient states he began having left chest tightness since 1400 today. Patient also c/o SOB, weakness, and dizziness.

## 2017-09-04 ENCOUNTER — Telehealth: Payer: Self-pay

## 2017-09-04 LAB — GASTROINTESTINAL PANEL BY PCR, STOOL (REPLACES STOOL CULTURE)
Adenovirus F40/41: NOT DETECTED
Astrovirus: NOT DETECTED
CRYPTOSPORIDIUM: NOT DETECTED
Campylobacter species: NOT DETECTED
Cyclospora cayetanensis: NOT DETECTED
ENTAMOEBA HISTOLYTICA: NOT DETECTED
ENTEROAGGREGATIVE E COLI (EAEC): NOT DETECTED
Enteropathogenic E coli (EPEC): NOT DETECTED
Enterotoxigenic E coli (ETEC): NOT DETECTED
GIARDIA LAMBLIA: NOT DETECTED
Norovirus GI/GII: DETECTED — AB
Plesimonas shigelloides: NOT DETECTED
Rotavirus A: NOT DETECTED
SALMONELLA SPECIES: NOT DETECTED
SHIGELLA/ENTEROINVASIVE E COLI (EIEC): NOT DETECTED
Sapovirus (I, II, IV, and V): NOT DETECTED
Shiga like toxin producing E coli (STEC): NOT DETECTED
VIBRIO CHOLERAE: NOT DETECTED
Vibrio species: NOT DETECTED
YERSINIA ENTEROCOLITICA: NOT DETECTED

## 2017-09-04 NOTE — ED Notes (Signed)
Date and time results received: 09/04/17 1940Test: Stool PCR Critical Value: Stool PCR positive for Norovirus  Name of Provider Notified:Dr. Addison Lank  Orders Received? Or Actions Taken?: Patient notified of positive PCR for Norovirus-also notified patient's daughter,Lisa, with permission of patient. Care and diet instructions given to patient and daughter. Instructed to return for any further needs or follow up with primary care physician

## 2017-09-04 NOTE — Telephone Encounter (Signed)
Pt on TCM list for nausea, atypical chest pain, vomitting and diarrhea for 1 day. Most consistent with viral gastroenteritis vs food poisoning. Doubt appendicitis, diverticulitis, severe colitis, dysentery.    Pt contacted and has scheduled ED follow with PCP for Wednesday at 03:20pm

## 2017-09-08 ENCOUNTER — Ambulatory Visit: Payer: Medicare Other | Admitting: Internal Medicine

## 2017-09-10 ENCOUNTER — Ambulatory Visit: Payer: Medicare Other | Admitting: Podiatry

## 2017-09-14 ENCOUNTER — Other Ambulatory Visit: Payer: Self-pay | Admitting: Internal Medicine

## 2017-09-17 ENCOUNTER — Ambulatory Visit: Payer: Medicare Other | Admitting: Podiatry

## 2017-09-17 ENCOUNTER — Ambulatory Visit (INDEPENDENT_AMBULATORY_CARE_PROVIDER_SITE_OTHER): Payer: Medicare Other | Admitting: Podiatry

## 2017-09-17 DIAGNOSIS — B351 Tinea unguium: Secondary | ICD-10-CM | POA: Diagnosis not present

## 2017-09-17 DIAGNOSIS — M79676 Pain in unspecified toe(s): Secondary | ICD-10-CM

## 2017-09-19 NOTE — Progress Notes (Signed)
Subjective: 82 y.o. returns the office today for painful, elongated, thickened toenails which he cannot trim himself. Denies any redness or drainage around the nails. Denies any acute changes since last appointment and no new complaints today. He states that the surgery is doing great and he has no issues with this and has good motion. Denies any systemic complaints such as fevers, chills, nausea, vomiting.   PCP: Biagio Borg, MD   Objective: AAO 3, NAD DP/PT pulses palpable, CRT less than 3 seconds Incision from prior surgery is well healed.  Nails hypertrophic, dystrophic, elongated, brittle, discolored 10. There is tenderness overlying the nails 1-5 bilaterally. There is no surrounding erythema or drainage along the nail sites. No open lesions or pre-ulcerative lesions are identified. No other areas of tenderness bilateral lower extremities. No overlying edema, erythema, increased warmth. No pain with calf compression, swelling, warmth, erythema.  Assessment: Patient presents with symptomatic onychomycosis  Plan: -Treatment options including alternatives, risks, complications were discussed -Nails sharply debrided 10 without complication/bleeding. -Discussed daily foot inspection. If there are any changes, to call the office immediately.  -Follow-up in 3 months or sooner if any problems are to arise. In the meantime, encouraged to call the office with any questions, concerns, changes symptoms.  Celesta Gentile, DPM

## 2017-09-24 DIAGNOSIS — R208 Other disturbances of skin sensation: Secondary | ICD-10-CM | POA: Diagnosis not present

## 2017-09-24 DIAGNOSIS — L723 Sebaceous cyst: Secondary | ICD-10-CM | POA: Diagnosis not present

## 2017-10-01 ENCOUNTER — Other Ambulatory Visit: Payer: Self-pay | Admitting: Internal Medicine

## 2017-10-05 ENCOUNTER — Other Ambulatory Visit: Payer: Self-pay | Admitting: Internal Medicine

## 2017-10-14 ENCOUNTER — Encounter: Payer: Self-pay | Admitting: Internal Medicine

## 2017-10-14 ENCOUNTER — Other Ambulatory Visit (INDEPENDENT_AMBULATORY_CARE_PROVIDER_SITE_OTHER): Payer: Medicare Other

## 2017-10-14 ENCOUNTER — Ambulatory Visit (INDEPENDENT_AMBULATORY_CARE_PROVIDER_SITE_OTHER): Payer: Medicare Other | Admitting: Internal Medicine

## 2017-10-14 VITALS — BP 128/84 | HR 64 | Temp 97.6°F | Ht 67.0 in | Wt 193.0 lb

## 2017-10-14 DIAGNOSIS — R7302 Impaired glucose tolerance (oral): Secondary | ICD-10-CM

## 2017-10-14 DIAGNOSIS — E785 Hyperlipidemia, unspecified: Secondary | ICD-10-CM

## 2017-10-14 DIAGNOSIS — I10 Essential (primary) hypertension: Secondary | ICD-10-CM | POA: Diagnosis not present

## 2017-10-14 LAB — LIPID PANEL
CHOL/HDL RATIO: 3
Cholesterol: 146 mg/dL (ref 0–200)
HDL: 47.6 mg/dL (ref >39.00)
LDL Cholesterol: 65 mg/dL (ref 0–99)
NONHDL: 97.94
Triglycerides: 167 mg/dL — ABNORMAL HIGH (ref 0.0–149.0)
VLDL: 33.4 mg/dL (ref 0.0–40.0)

## 2017-10-14 LAB — URINALYSIS, ROUTINE W REFLEX MICROSCOPIC
Bilirubin Urine: NEGATIVE
Hgb urine dipstick: NEGATIVE
KETONES UR: NEGATIVE
Nitrite: NEGATIVE
PH: 6 (ref 5.0–8.0)
RBC / HPF: NONE SEEN (ref 0–?)
SPECIFIC GRAVITY, URINE: 1.02 (ref 1.000–1.030)
TOTAL PROTEIN, URINE-UPE24: NEGATIVE
UROBILINOGEN UA: 0.2 (ref 0.0–1.0)
Urine Glucose: NEGATIVE

## 2017-10-14 LAB — BASIC METABOLIC PANEL
BUN: 13 mg/dL (ref 6–23)
CALCIUM: 9.3 mg/dL (ref 8.4–10.5)
CO2: 29 meq/L (ref 19–32)
CREATININE: 0.78 mg/dL (ref 0.40–1.50)
Chloride: 102 mEq/L (ref 96–112)
GFR: 101.73 mL/min (ref >60.00)
GLUCOSE: 90 mg/dL (ref 70–99)
Potassium: 4.5 mEq/L (ref 3.5–5.1)
Sodium: 137 mEq/L (ref 135–145)

## 2017-10-14 LAB — CBC WITH DIFFERENTIAL/PLATELET
Basophils Absolute: 0.1 10*3/uL (ref 0.0–0.1)
Basophils Relative: 1.4 % (ref 0.0–3.0)
EOS ABS: 0.2 10*3/uL (ref 0.0–0.7)
Eosinophils Relative: 2.5 % (ref 0.0–5.0)
HEMATOCRIT: 43 % (ref 39.0–52.0)
Hemoglobin: 14.8 g/dL (ref 13.0–17.0)
LYMPHS PCT: 21.3 % (ref 12.0–46.0)
Lymphs Abs: 1.3 10*3/uL (ref 0.7–4.0)
MCHC: 34.5 g/dL (ref 30.0–36.0)
MCV: 93.7 fl (ref 78.0–100.0)
MONOS PCT: 11.7 % (ref 3.0–12.0)
Monocytes Absolute: 0.7 10*3/uL (ref 0.1–1.0)
NEUTROS ABS: 3.8 10*3/uL (ref 1.4–7.7)
Neutrophils Relative %: 63.1 % (ref 43.0–77.0)
PLATELETS: 222 10*3/uL (ref 150.0–400.0)
RBC: 4.59 Mil/uL (ref 4.22–5.81)
RDW: 12.6 % (ref 11.5–15.5)
WBC: 6.1 10*3/uL (ref 4.0–10.5)

## 2017-10-14 LAB — HEMOGLOBIN A1C: HEMOGLOBIN A1C: 5.4 % (ref 4.6–6.5)

## 2017-10-14 LAB — HEPATIC FUNCTION PANEL
ALK PHOS: 95 U/L (ref 39–117)
ALT: 45 U/L (ref 0–53)
AST: 31 U/L (ref 0–37)
Albumin: 4 g/dL (ref 3.5–5.2)
Bilirubin, Direct: 0.1 mg/dL (ref 0.0–0.3)
Total Bilirubin: 0.4 mg/dL (ref 0.2–1.2)
Total Protein: 6.9 g/dL (ref 6.0–8.3)

## 2017-10-14 LAB — TSH: TSH: 3.04 u[IU]/mL (ref 0.35–4.50)

## 2017-10-14 NOTE — Progress Notes (Signed)
Subjective:    Patient ID: [name], male    DOB: 07-14-1936, 82 y.o.   MRN: [id number]  HPI  Here for yearly f/u;  Overall doing ok;  Pt denies Chest pain, worsening SOB, DOE, wheezing, orthopnea, PND, worsening LE edema, palpitations, dizziness or syncope.  Pt denies neurological change such as new headache, facial or extremity weakness.  Pt denies polydipsia, polyuria, or low sugar symptoms. Pt states overall good compliance with treatment and medications, good tolerability, and has been trying to follow appropriate diet.  Pt denies worsening depressive symptoms, suicidal ideation or panic. No fever, night sweats, wt loss, loss of appetite, or other constitutional symptoms.  Pt states good ability with ADL's, has low fall risk, home safety reviewed and adequate, no other significant changes in hearing or vision, and only occasionally active with exercise.  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  Had episode of Norovirus feb 2019 now resolved. Also now s/p right bunionectomy .  Also recently had "extensive heart workup" at the [place], and plans to forward those results.  No other interval hx or new complaints Past Medical History:  Diagnosis Date  . Acute bronchitis per pt no fever--  cough since discharge from hospital 07-02-2015   per pcp note 07-05-2015  mild to moderate bronchitis vs pna  . Allergic rhinitis   . Bladder cancer ([place])   . BPH (benign prostatic hyperplasia)   . Bradycardia   . CAD (coronary artery disease)   . CHF (congestive heart failure) ([place])   . Chronic low back pain   . DDD (degenerative disc disease), lumbosacral   . Depression   . Diverticulosis of colon (without mention of hemorrhage)   . Dyspnea   . Essential tremor   . Fatty liver disease, nonalcoholic   . GERD (gastroesophageal reflux disease)   . Glaucoma   . History of bladder cancer    2004--  TCC low-grade , non-invasive  . History of colon polyps   . History of  exercise stress test    02-05-2007-- ETT (Clinically positive, electrically equivocal, submaximal ETT,  appropriate bp response to exercise  . History of hiatal hernia   . History of kidney stones   . History of peptic ulcer   . HLD (hyperlipidemia)   . HTN (hypertension)   . Nocturia   . Productive cough   . Rheumatoid arthritis(714.0)   . Right renal artery stenosis ([facility])    per cath 03-14-2009 --- 40-50%  . RLS (restless legs syndrome)   . Vertigo, intermittent   . Weak urinary stream   . Wears dentures    upper  . Wears glasses   . Wears hearing aid    bilateral  . Wears partial dentures    lower   Past Surgical History:  Procedure Laterality Date  . CARDIAC CATHETERIZATION  02-12-2007  dr gregg taylor   Non-obstructive CAD,  20% pLCX,  20% LAD, ef 65%  . CARDIAC CATHETERIZATION  03-14-2009  dr Johnsie Cancel   pLAD and mid diaginal 20-30% multiple lesions,  OM1 20%, right renal ostial 40-50%,  ef 60%  . CATARACT EXTRACTION W/ INTRAOCULAR LENS  IMPLANT, BILATERAL  2016  . CYSTOSCOPY WITH BIOPSY N/A 07/11/2015   Procedure: CYSTOSCOPY WITH COLD CUP RESECTION AND FULGERATION;  Surgeon: Rana Snare, MD;  Location: [facility];  Service: Urology;  Laterality: N/A;  . HIP ARTHROSCOPY W/ LABRAL DEBRIDEMENT Left 06-29-2000   and  chondraplasty  . LUMBAR [place] SURGERY  07-16-2005  . LUMBAR LAMINECTOMY/DECOMPRESSION MICRODISCECTOMY  07/09/2011   Procedure: LUMBAR LAMINECTOMY/DECOMPRESSION MICRODISCECTOMY;  Surgeon: Johnn Hai;  Location: [facility];  Service: Orthopedics;  Laterality: Left;  Decompression L5 - S1 on the Left and repair of dura  . LUMBAR LAMINECTOMY/DECOMPRESSION MICRODISCECTOMY N/A 07/27/2013   Procedure: DECOMPRESSION L4 - L5 WITH EXCISION OF SYNOVIAL CYST AND, LATERAL MASS FUSION 1 LEVEL;  Surgeon: Johnn Hai, MD;  Location: [facility];  Service: Orthopedics;  Laterality: N/A;  . LUMBAR LAMINECTOMY/DECOMPRESSION MICRODISCECTOMY Left 07/06/2014   Procedure:   MICRO LUMBAR DECOMPRESSION L5-S1 ON LEFT, L4-5 REDO;  Surgeon: Johnn Hai, MD;  Location: [facility];  Service: Orthopedics;  Laterality: Left;  . SPINAL CORD STIMULATOR INSERTION N/A 09/11/2016   Procedure: Spinal cord stimulator placement;  Surgeon: Melina Schools, MD;  Location: [place];  Service: Orthopedics;  Laterality: N/A;  . TONSILLECTOMY AND ADENOIDECTOMY    . TRANSTHORACIC ECHOCARDIOGRAM  09-20-2010   normal LVF, ef 55-65%/  mild MR, TR and PR/  mild LAE  . TRANSURETHRAL RESECTION OF BLADDER TUMOR  09-21-2002    reports that he quit smoking about 35 years ago. His smoking use included cigarettes. He has a 40.00 pack-year smoking history. He has never used smokeless tobacco. He reports that he drinks alcohol. He reports that he does not use drugs. family history includes Cancer in his brother, father, and sister; Diabetes in his sister; Heart attack in his mother. Allergies  Allergen Reactions  . Morphine And Related Other (See Comments)    Migraine   . Hydrochlorothiazide Other (See Comments)    REACTION: hyponatremia with daily use  . Tape Itching    Patient  Is ok to use paper tape  . Latex Rash  . Lisinopril Cough  . Sulfa Antibiotics Rash   Current Outpatient Medications on File Prior to Visit  Medication Sig Dispense Refill  . acetaminophen (TYLENOL) 650 MG CR tablet Take 1,300 mg by mouth at bedtime as needed for pain.    Marland Kitchen albuterol (PROVENTIL HFA;VENTOLIN HFA) 108 (90 Base) MCG/ACT inhaler INHALE 2 PUFFS INTO THE LUNGS EVERY 6 HOURS AS NEEDED FOR WHEEZING OR SHORTNESS OF BREATH 18 g 0  . amLODipine (NORVASC) 5 MG tablet TAKE 1 TABLET EVERY DAY 90 tablet 2  . brimonidine (ALPHAGAN) 0.15 % ophthalmic solution Place 1 drop into both eyes 2 (two) times daily.    . cephALEXin (KEFLEX) 500 MG capsule Take 1 capsule (500 mg total) by mouth 3 (three) times daily. 30 capsule 1  . citalopram (CELEXA) 10 MG tablet TAKE 1 TABLET EVERY MORNING 90 tablet 3  . clotrimazole (LOTRIMIN) 1  % cream Apply 1 application topically 2 (two) times daily as needed (FOR RASH). Prescribed for rash in groin    . fluticasone (FLONASE) 50 MCG/ACT nasal spray USE 2 SPRAYS IN EACH NOSTRIL EVERY DAY AS NEEDED  FOR  RHINITIS/ALLERGIES 48 g 3  . gabapentin (NEURONTIN) 300 MG capsule TAKE 1 TO 2 CAPSULES FOUR TIMES DAILY  AFTER MEALS AND AT BEDTIME. (Patient taking differently: Tak 4 capsule(1200 MG) in am and 8 capsules(2400 MG) at bedtime) 720 capsule 3  . hydrochlorothiazide (HYDRODIURIL) 25 MG tablet TAKE 1 TABLET EVERY DAY 90 tablet 1  . hydroxypropyl methylcellulose / hypromellose (ISOPTO TEARS / GONIOVISC) 2.5 % ophthalmic solution Place 1 drop into both eyes 2 (two) times daily.    Marland Kitchen losartan (COZAAR) 50 MG tablet TAKE 1 TABLET EVERY DAY 90 tablet 2  .  meclizine (ANTIVERT) 25 MG tablet TAKE 1 TABLET TWO TIMES DAILY AS NEEDED FOR DIZZINESS 180 tablet 0  . methocarbamol (ROBAXIN) 500 MG tablet Take 1 tablet (500 mg total) by mouth 3 (three) times daily as needed for muscle spasms. 21 tablet 0  . nitroGLYCERIN (NITROSTAT) 0.4 MG SL tablet Place 1 tablet (0.4 mg total) under the tongue every 5 (five) minutes as needed. Chest pain 60 tablet 5  . Nutritional Supplements (JUICE PLUS FIBRE PO) Take 1 each 2 (two) times daily by mouth.    Marland Kitchen omeprazole (PRILOSEC) 40 MG capsule Take 1 capsule by mouth twice daily. 180 capsule 3  . ondansetron (ZOFRAN) 4 MG tablet Take 1 tablet (4 mg total) by mouth every 8 (eight) hours as needed for nausea or vomiting. 20 tablet 0  . potassium chloride (K-DUR,KLOR-CON) 10 MEQ tablet TAKE 2 TABLETS EVERY DAY 180 tablet 1  . primidone (MYSOLINE) 250 MG tablet TAKE 1/2 TABLET THREE TIMES DAILY 135 tablet 2  . simvastatin (ZOCOR) 40 MG tablet TAKE 1 AND 1/2 TABLETS AT BEDTIME (Patient taking differently: TAKE 1 in the AM AND 1/2 TABLETS AT BEDTIME) 135 tablet 2  . tamsulosin (FLOMAX) 0.4 MG CAPS capsule Take 1 capsule (0.4 mg total) by mouth daily. (Patient taking differently:  Take 0.8 mg by mouth at bedtime. ) 90 capsule 3  . timolol (TIMOPTIC) 0.5 % ophthalmic solution Place 1 drop into both eyes 2 (two) times daily.   1  . Travoprost, BAK Free, (TRAVATAN Z) 0.004 % SOLN ophthalmic solution Place 1 drop into both eyes at bedtime. 5 mL 5  . triamcinolone (KENALOG) 0.025 % cream Apply 1 application topically 2 (two) times daily as needed (rash).      No current facility-administered medications on file prior to visit.    Review of Systems  Constitutional: Negative for other unusual diaphoresis or sweats HENT: Negative for ear discharge or swelling Eyes: Negative for other worsening visual disturbances Respiratory: Negative for stridor or other swelling  Gastrointestinal: Negative for worsening distension or other blood Genitourinary: Negative for retention or other urinary change Musculoskeletal: Negative for other MSK pain or swelling Skin: Negative for color change or other new lesions Neurological: Negative for worsening tremors and other numbness  Psychiatric/Behavioral: Negative for worsening agitation or other fatigue All other system neg per pt    Objective:   Physical Exam  BP 128/84   Pulse 64   Temp 97.6 F (36.4 C) (Oral)   Ht 5\' 7"  (1.702 m)   Wt 193 lb (87.5 kg)   SpO2 96%   BMI 30.23 kg/m  /VS noted,  Constitutional: Pt appears in NAD HENT: Head: NCAT.  Right Ear: External ear normal.  Left Ear: External ear normal.  Eyes: . Pupils are equal, round, and reactive to light. Conjunctivae and EOM are normal Nose: without d/c or deformity Neck: Neck supple. Gross normal ROM Cardiovascular: Normal rate and regular rhythm.   Pulmonary/Chest: Effort normal and breath sounds without rales or wheezing.  Abd:  Soft, NT, ND, + BS, no organomegaly MSK  - no joint effusions or tenderness Neurological: Pt is alert. At baseline orientation, motor grossly intact Skin: Skin is warm. No rashes, other new lesions, no LE edema Psychiatric: Pt behavior  is normal without agitation  No other exam findings    Assessment & Plan:

## 2017-10-14 NOTE — Patient Instructions (Addendum)
Your handicapped parking application was done  Please continue all other medications as before, and refills have been done if requested.  Please have the pharmacy call with any other refills you may need.  Please continue your efforts at being more active, low cholesterol diet, and weight control.  You are otherwise up to date with prevention measures today.  Please keep your appointments with your specialists as you may have planned  Please go to the LAB in the Basement (turn left off the elevator) for the tests to be done today  You will be contacted by phone if any changes need to be made immediately.  Otherwise, you will receive a letter about your results with an explanation, but please check with MyChart first.  Please remember to sign up for MyChart if you have not done so, as this will be important to you in the future with finding out test results, communicating by private email, and scheduling acute appointments online when needed.  Please return in 6 months, or sooner if needed

## 2017-10-17 NOTE — Assessment & Plan Note (Signed)
stable overall by history and exam, recent data reviewed with pt, and pt to continue medical treatment as before,  to f/u any worsening symptoms or concerns Lab Results  Component Value Date   LDLCALC 65 10/14/2017

## 2017-10-17 NOTE — Assessment & Plan Note (Signed)
BP Readings from Last 3 Encounters:  10/14/17 128/84  09/03/17 (!) 145/64  06/09/17 132/66  stable overall by history and exam, recent data reviewed with pt, and pt to continue medical treatment as before,  to f/u any worsening symptoms or concerns

## 2017-10-17 NOTE — Assessment & Plan Note (Signed)
Lab Results  Component Value Date   HGBA1C 5.4 10/14/2017  stable overall by history and exam, recent data reviewed with pt, and pt to continue medical treatment as before,  to f/u any worsening symptoms or concerns

## 2017-10-21 DIAGNOSIS — H04123 Dry eye syndrome of bilateral lacrimal glands: Secondary | ICD-10-CM | POA: Diagnosis not present

## 2017-10-21 DIAGNOSIS — H43813 Vitreous degeneration, bilateral: Secondary | ICD-10-CM | POA: Diagnosis not present

## 2017-10-21 DIAGNOSIS — H02054 Trichiasis without entropian left upper eyelid: Secondary | ICD-10-CM | POA: Diagnosis not present

## 2017-10-21 DIAGNOSIS — H401133 Primary open-angle glaucoma, bilateral, severe stage: Secondary | ICD-10-CM | POA: Diagnosis not present

## 2017-10-26 ENCOUNTER — Other Ambulatory Visit: Payer: Self-pay | Admitting: Internal Medicine

## 2017-11-18 ENCOUNTER — Ambulatory Visit (INDEPENDENT_AMBULATORY_CARE_PROVIDER_SITE_OTHER): Payer: Medicare Other | Admitting: Internal Medicine

## 2017-11-18 ENCOUNTER — Encounter: Payer: Self-pay | Admitting: Internal Medicine

## 2017-11-18 VITALS — BP 146/88 | HR 67 | Temp 98.3°F | Ht 67.0 in | Wt 190.0 lb

## 2017-11-18 DIAGNOSIS — I10 Essential (primary) hypertension: Secondary | ICD-10-CM

## 2017-11-18 DIAGNOSIS — F329 Major depressive disorder, single episode, unspecified: Secondary | ICD-10-CM | POA: Diagnosis not present

## 2017-11-18 DIAGNOSIS — R7302 Impaired glucose tolerance (oral): Secondary | ICD-10-CM | POA: Diagnosis not present

## 2017-11-18 DIAGNOSIS — F32A Depression, unspecified: Secondary | ICD-10-CM

## 2017-11-18 MED ORDER — ALBUTEROL SULFATE HFA 108 (90 BASE) MCG/ACT IN AERS: 2.0000 | INHALATION_SPRAY | Freq: Four times a day (QID) | RESPIRATORY_TRACT | 3 refills | Status: DC | PRN

## 2017-11-18 MED ORDER — LOSARTAN POTASSIUM 100 MG PO TABS: 100.0000 mg | ORAL_TABLET | Freq: Every day | ORAL | 3 refills | Status: DC

## 2017-11-18 MED ORDER — AMLODIPINE BESYLATE 10 MG PO TABS: 10.0000 mg | ORAL_TABLET | Freq: Every day | ORAL | 3 refills | Status: DC

## 2017-11-18 NOTE — Assessment & Plan Note (Signed)
Uncontrolled, for increased losartan 100 qd, and amlodipine to 10 mg daily, cont all other tx, watch for any BP lows and continue to monitor at home,  to f/u any worsening symptoms or concerns

## 2017-11-18 NOTE — Assessment & Plan Note (Signed)
Lab Results  Component Value Date   HGBA1C 5.4 10/14/2017  stable overall by history and exam, recent data reviewed with pt, and pt to continue medical treatment as before,  to f/u any worsening symptoms or concerns

## 2017-11-18 NOTE — Assessment & Plan Note (Signed)
stable overall by history and exam, recent data reviewed with pt, and pt to continue medical treatment as before,  to f/u any worsening symptoms or concerns  

## 2017-11-18 NOTE — Progress Notes (Signed)
Subjective:    Patient ID: [name], male    DOB: 01/01/1936, 82 y.o.   MRN: [id number]  HPI  Here to f/u; overall doing ok,  Pt denies chest pain, increasing sob or doe, wheezing, orthopnea, PND, increased LE swelling, palpitations, or syncope.  Pt denies new neurological symptoms such as new headache, or facial or extremity weakness or numbness.  Pt denies polydipsia, polyuria, or low sugar episode.  Pt states overall good compliance with meds, mostly trying to follow appropriate diet, with wt overall stable,  but little exercise however.  BP has been high in the past wk as much as 197/94 last sun and mon with dizziness.   Pt denies fever, wt loss, night sweats, loss of appetite, or other constitutional symptoms.  Overall pain level no change. Denies worsening depressive symptoms, suicidal ideation, or panic; has ongoing anxiety, not increased recently.  Wt Readings from Last 3 Encounters:  11/18/17 190 lb (86.2 kg)  10/14/17 193 lb (87.5 kg)  09/03/17 190 lb (86.2 kg)   Past Medical History:  Diagnosis Date  . Acute bronchitis per pt no fever--  cough since discharge from hospital 07-02-2015   per pcp note 07-05-2015  mild to moderate bronchitis vs pna  . Allergic rhinitis   . Bladder cancer ([place])   . BPH (benign prostatic hyperplasia)   . Bradycardia   . CAD (coronary artery disease)   . CHF (congestive heart failure) ([place])   . Chronic low back pain   . DDD (degenerative disc disease), lumbosacral   . Depression   . Diverticulosis of colon (without mention of hemorrhage)   . Dyspnea   . Essential tremor   . Fatty liver disease, nonalcoholic   . GERD (gastroesophageal reflux disease)   . Glaucoma   . History of bladder cancer    2004--  TCC low-grade , non-invasive  . History of colon polyps   . History of exercise stress test    02-05-2007-- ETT (Clinically positive, electrically equivocal, submaximal ETT,  appropriate bp response to exercise  . History of hiatal hernia     . History of kidney stones   . History of peptic ulcer   . HLD (hyperlipidemia)   . HTN (hypertension)   . Nocturia   . Productive cough   . Rheumatoid arthritis(714.0)   . Right renal artery stenosis ([facility])    per cath 03-14-2009 --- 40-50%  . RLS (restless legs syndrome)   . Vertigo, intermittent   . Weak urinary stream   . Wears dentures    upper  . Wears glasses   . Wears hearing aid    bilateral  . Wears partial dentures    lower   Past Surgical History:  Procedure Laterality Date  . CARDIAC CATHETERIZATION  02-12-2007  dr gregg taylor   Non-obstructive CAD,  20% pLCX,  20% LAD, ef 65%  . CARDIAC CATHETERIZATION  03-14-2009  dr Johnsie Cancel   pLAD and mid diaginal 20-30% multiple lesions,  OM1 20%, right renal ostial 40-50%,  ef 60%  . CATARACT EXTRACTION W/ INTRAOCULAR LENS  IMPLANT, BILATERAL  2016  . CYSTOSCOPY WITH BIOPSY N/A 07/11/2015   Procedure: CYSTOSCOPY WITH COLD CUP RESECTION AND FULGERATION;  Surgeon: Rana Snare, MD;  Location: [facility];  Service: Urology;  Laterality: N/A;  . HIP ARTHROSCOPY W/ LABRAL DEBRIDEMENT Left 06-29-2000   and chondraplasty  . LUMBAR [place] SURGERY  07-16-2005  . LUMBAR LAMINECTOMY/DECOMPRESSION MICRODISCECTOMY  07/09/2011   Procedure: LUMBAR  LAMINECTOMY/DECOMPRESSION MICRODISCECTOMY;  Surgeon: Johnn Hai;  Location: [facility];  Service: Orthopedics;  Laterality: Left;  Decompression L5 - S1 on the Left and repair of dura  . LUMBAR LAMINECTOMY/DECOMPRESSION MICRODISCECTOMY N/A 07/27/2013   Procedure: DECOMPRESSION L4 - L5 WITH EXCISION OF SYNOVIAL CYST AND, LATERAL MASS FUSION 1 LEVEL;  Surgeon: Johnn Hai, MD;  Location: [facility];  Service: Orthopedics;  Laterality: N/A;  . LUMBAR LAMINECTOMY/DECOMPRESSION MICRODISCECTOMY Left 07/06/2014   Procedure:  MICRO LUMBAR DECOMPRESSION L5-S1 ON LEFT, L4-5 REDO;  Surgeon: Johnn Hai, MD;  Location: [facility];  Service: Orthopedics;  Laterality: Left;  . SPINAL CORD  STIMULATOR INSERTION N/A 09/11/2016   Procedure: Spinal cord stimulator placement;  Surgeon: Melina Schools, MD;  Location: [place];  Service: Orthopedics;  Laterality: N/A;  . TONSILLECTOMY AND ADENOIDECTOMY    . TRANSTHORACIC ECHOCARDIOGRAM  09-20-2010   normal LVF, ef 55-65%/  mild MR, TR and PR/  mild LAE  . TRANSURETHRAL RESECTION OF BLADDER TUMOR  09-21-2002    reports that he quit smoking about 35 years ago. His smoking use included cigarettes. He has a 40.00 pack-year smoking history. He has never used smokeless tobacco. He reports that he drinks alcohol. He reports that he does not use drugs. family history includes Cancer in his brother, father, and sister; Diabetes in his sister; Heart attack in his mother. Allergies  Allergen Reactions  . Morphine And Related Other (See Comments)    Migraine   . Hydrochlorothiazide Other (See Comments)    REACTION: hyponatremia with daily use  . Tape Itching    Patient  Is ok to use paper tape  . Latex Rash  . Lisinopril Cough  . Sulfa Antibiotics Rash   Current Outpatient Medications on File Prior to Visit  Medication Sig Dispense Refill  . acetaminophen (TYLENOL) 650 MG CR tablet Take 1,300 mg by mouth at bedtime as needed for pain.    . brimonidine (ALPHAGAN) 0.15 % ophthalmic solution Place 1 drop into both eyes 2 (two) times daily.    . citalopram (CELEXA) 10 MG tablet TAKE 1 TABLET EVERY MORNING 90 tablet 3  . clotrimazole (LOTRIMIN) 1 % cream Apply 1 application topically 2 (two) times daily as needed (FOR RASH). Prescribed for rash in groin    . fluticasone (FLONASE) 50 MCG/ACT nasal spray USE 2 SPRAYS IN EACH NOSTRIL EVERY DAY AS NEEDED  FOR  RHINITIS/ALLERGIES 48 g 3  . gabapentin (NEURONTIN) 300 MG capsule TAKE 1 TO 2 CAPSULES FOUR TIMES DAILY  AFTER MEALS AND AT BEDTIME. (Patient taking differently: Tak 4 capsule(1200 MG) in am and 8 capsules(2400 MG) at bedtime) 720 capsule 3  . hydrochlorothiazide (HYDRODIURIL) 25 MG tablet TAKE 1  TABLET EVERY DAY 90 tablet 1  . hydroxypropyl methylcellulose / hypromellose (ISOPTO TEARS / GONIOVISC) 2.5 % ophthalmic solution Place 1 drop into both eyes 2 (two) times daily.    . meclizine (ANTIVERT) 25 MG tablet TAKE 1 TABLET TWO TIMES DAILY AS NEEDED FOR DIZZINESS 180 tablet 0  . methocarbamol (ROBAXIN) 500 MG tablet Take 1 tablet (500 mg total) by mouth 3 (three) times daily as needed for muscle spasms. 21 tablet 0  . nitroGLYCERIN (NITROSTAT) 0.4 MG SL tablet Place 1 tablet (0.4 mg total) under the tongue every 5 (five) minutes as needed. Chest pain 60 tablet 5  . Nutritional Supplements (JUICE PLUS FIBRE PO) Take 1 each 2 (two) times daily by mouth.    Marland Kitchen omeprazole (PRILOSEC) 40 MG capsule Take  1 capsule by mouth twice daily. 180 capsule 3  . ondansetron (ZOFRAN) 4 MG tablet Take 1 tablet (4 mg total) by mouth every 8 (eight) hours as needed for nausea or vomiting. 20 tablet 0  . potassium chloride (K-DUR,KLOR-CON) 10 MEQ tablet TAKE 2 TABLETS EVERY DAY 180 tablet 1  . primidone (MYSOLINE) 250 MG tablet TAKE 1/2 TABLET THREE TIMES DAILY 135 tablet 2  . simvastatin (ZOCOR) 40 MG tablet TAKE 1 AND 1/2 TABLETS AT BEDTIME (Patient taking differently: TAKE 1 in the AM AND 1/2 TABLETS AT BEDTIME) 135 tablet 2  . tamsulosin (FLOMAX) 0.4 MG CAPS capsule Take 1 capsule (0.4 mg total) by mouth daily. (Patient taking differently: Take 0.8 mg by mouth at bedtime. ) 90 capsule 3  . timolol (TIMOPTIC) 0.5 % ophthalmic solution Place 1 drop into both eyes 2 (two) times daily.   1  . Travoprost, BAK Free, (TRAVATAN Z) 0.004 % SOLN ophthalmic solution Place 1 drop into both eyes at bedtime. 5 mL 5  . triamcinolone (KENALOG) 0.025 % cream Apply 1 application topically 2 (two) times daily as needed (rash).      No current facility-administered medications on file prior to visit.    Review of Systems  Constitutional: Negative for other unusual diaphoresis or sweats HENT: Negative for ear discharge or  swelling Eyes: Negative for other worsening visual disturbances Respiratory: Negative for stridor or other swelling  Gastrointestinal: Negative for worsening distension or other blood Genitourinary: Negative for retention or other urinary change Musculoskeletal: Negative for other MSK pain or swelling Skin: Negative for color change or other new lesions Neurological: Negative for worsening tremors and other numbness  Psychiatric/Behavioral: Negative for worsening agitation or other fatigue All other system neg per pt    Objective:   Physical Exam BP (!) 146/88   Pulse 67   Temp 98.3 F (36.8 C) (Oral)   Ht 5\' 7"  (1.702 m)   Wt 190 lb (86.2 kg)   SpO2 97%   BMI 29.76 kg/m  VS noted, non toxic Constitutional: Pt appears in NAD HENT: Head: NCAT.  Right Ear: External ear normal.  Left Ear: External ear normal.  Eyes: . Pupils are equal, round, and reactive to light. Conjunctivae and EOM are normal Nose: without d/c or deformity Neck: Neck supple. Gross normal ROM Cardiovascular: Normal rate and regular rhythm.   Pulmonary/Chest: Effort normal and breath sounds without rales or wheezing.  Neurological: Pt is alert. At baseline orientation, motor grossly intact Skin: Skin is warm. No rashes, other new lesions, no LE edema Psychiatric: Pt behavior is normal without agitation , not depressed affect No other exam findings  Lab Results  Component Value Date   WBC 6.1 10/14/2017   HGB 14.8 10/14/2017   HCT 43.0 10/14/2017   PLT 222.0 10/14/2017   GLUCOSE 90 10/14/2017   CHOL 146 10/14/2017   TRIG 167.0 (H) 10/14/2017   HDL 47.60 10/14/2017   LDLDIRECT 93.6 10/28/2012   LDLCALC 65 10/14/2017   ALT 45 10/14/2017   AST 31 10/14/2017   NA 137 10/14/2017   K 4.5 10/14/2017   CL 102 10/14/2017   CREATININE 0.78 10/14/2017   BUN 13 10/14/2017   CO2 29 10/14/2017   TSH 3.04 10/14/2017   PSA 0.53 11/12/2016   INR 1.10 07/01/2015   HGBA1C 5.4 10/14/2017     Assessment &  Plan:

## 2017-11-18 NOTE — Patient Instructions (Addendum)
OK to increase the amlodipine to 10 mg per day  OK to increase the losartan to 100 mg per day  Please continue all other medications as before, and refills have been done if requested - the inhaler  Please have the pharmacy call with any other refills you may need.  Please continue your efforts at being more active, low cholesterol diet, and weight control.  Please keep your appointments with your specialists as you may have planned

## 2017-11-23 ENCOUNTER — Other Ambulatory Visit: Payer: Self-pay | Admitting: Internal Medicine

## 2017-12-14 ENCOUNTER — Ambulatory Visit (INDEPENDENT_AMBULATORY_CARE_PROVIDER_SITE_OTHER): Payer: Medicare Other | Admitting: *Deleted

## 2017-12-14 VITALS — BP 154/75 | HR 57 | Resp 18 | Ht 67.0 in | Wt 190.0 lb

## 2017-12-14 DIAGNOSIS — Z0000 Encounter for general adult medical examination without abnormal findings: Secondary | ICD-10-CM | POA: Diagnosis not present

## 2017-12-14 NOTE — Progress Notes (Addendum)
Subjective:   [name] is a 82 y.o. male who presents for Medicare Annual/Subsequent preventive examination.  Review of Systems:  No ROS.  Medicare Wellness Visit. Additional risk factors are reflected in the social history.  Cardiac Risk Factors include: advanced age (>86men, >67 women);dyslipidemia;male gender;hypertension Sleep patterns: gets up 1-2 times nightly to void and sleeps 7 hours nightly.    Home Safety/Smoke Alarms: Feels safe in home. Smoke alarms in place.  Living environment; residence and Firearm Safety: 1-story house/ trailer, no firearms, Lives alone, no needs for DME, good support system. Seat Belt Safety/Bike Helmet: Wears seat belt.   PSA-  Lab Results  Component Value Date   PSA 0.53 11/12/2016   PSA 0.29 10/28/2012   PSA 0.21 02/07/2008       Objective:    Vitals: BP (!) 154/75   Pulse (!) 57   Resp 18   Ht 5\' 7"  (1.702 m)   Wt 190 lb (86.2 kg)   SpO2 99%   BMI 29.76 kg/m   Body mass index is 29.76 kg/m.  Advanced Directives 12/14/2017 09/03/2017 06/01/2017 01/20/2017 12/12/2016 09/11/2016 09/04/2016  Does Patient Have a Medical Advance Directive? No No No Yes No No No  Type of Advance Directive - - - [place]  Does patient want to make changes to medical advance directive? - No - Patient declined - - - - -  Would patient like information on creating a medical advance directive? No - Patient declined No - Patient declined - No - Patient declined Yes (ED - Information included in AVS) - -  Pre-existing out of facility DNR order (yellow form or pink MOST form) - - - - - - -    Tobacco Social History   Tobacco Use  Smoking Status Former Smoker  . Packs/day: 2.00  . Years: 20.00  . Pack years: 40.00  . Types: Cigarettes  . Last attempt to quit: 10/08/1982  . Years since quitting: 35.2  Smokeless Tobacco Never Used     Counseling given: Not Answered   Past Medical History:  Diagnosis Date  . Acute bronchitis per  pt no fever--  cough since discharge from hospital 07-02-2015   per pcp note 07-05-2015  mild to moderate bronchitis vs pna  . Allergic rhinitis   . Bladder cancer ([place])   . BPH (benign prostatic hyperplasia)   . Bradycardia   . CAD (coronary artery disease)   . Chronic low back pain   . DDD (degenerative disc disease), lumbosacral   . Depression   . Diverticulosis of colon (without mention of hemorrhage)   . Dyspnea   . Essential tremor   . Fatty liver disease, nonalcoholic   . GERD (gastroesophageal reflux disease)   . Glaucoma   . History of bladder cancer    2004--  TCC low-grade , non-invasive  . History of colon polyps   . History of exercise stress test    02-05-2007-- ETT (Clinically positive, electrically equivocal, submaximal ETT,  appropriate bp response to exercise  . History of hiatal hernia   . History of kidney stones   . History of peptic ulcer   . HLD (hyperlipidemia)   . HTN (hypertension)   . Nocturia   . Productive cough   . Rheumatoid arthritis(714.0)   . Right renal artery stenosis ([facility])    per cath 03-14-2009 --- 40-50%  . RLS (restless legs syndrome)   . Vertigo, intermittent   . Weak  urinary stream   . Wears dentures    upper  . Wears glasses   . Wears hearing aid    bilateral  . Wears partial dentures    lower   Past Surgical History:  Procedure Laterality Date  . CARDIAC CATHETERIZATION  02-12-2007  dr gregg taylor   Non-obstructive CAD,  20% pLCX,  20% LAD, ef 65%  . CARDIAC CATHETERIZATION  03-14-2009  dr Johnsie Cancel   pLAD and mid diaginal 20-30% multiple lesions,  OM1 20%, right renal ostial 40-50%,  ef 60%  . CATARACT EXTRACTION W/ INTRAOCULAR LENS  IMPLANT, BILATERAL  2016  . CYSTOSCOPY WITH BIOPSY N/A 07/11/2015   Procedure: CYSTOSCOPY WITH COLD CUP RESECTION AND FULGERATION;  Surgeon: Rana Snare, MD;  Location: [facility];  Service: Urology;  Laterality: N/A;  . HIP ARTHROSCOPY W/ LABRAL DEBRIDEMENT Left 06-29-2000    and chondraplasty  . LUMBAR [place] SURGERY  07-16-2005  . LUMBAR LAMINECTOMY/DECOMPRESSION MICRODISCECTOMY  07/09/2011   Procedure: LUMBAR LAMINECTOMY/DECOMPRESSION MICRODISCECTOMY;  Surgeon: Johnn Hai;  Location: [facility];  Service: Orthopedics;  Laterality: Left;  Decompression L5 - S1 on the Left and repair of dura  . LUMBAR LAMINECTOMY/DECOMPRESSION MICRODISCECTOMY N/A 07/27/2013   Procedure: DECOMPRESSION L4 - L5 WITH EXCISION OF SYNOVIAL CYST AND, LATERAL MASS FUSION 1 LEVEL;  Surgeon: Johnn Hai, MD;  Location: [facility];  Service: Orthopedics;  Laterality: N/A;  . LUMBAR LAMINECTOMY/DECOMPRESSION MICRODISCECTOMY Left 07/06/2014   Procedure:  MICRO LUMBAR DECOMPRESSION L5-S1 ON LEFT, L4-5 REDO;  Surgeon: Johnn Hai, MD;  Location: [facility];  Service: Orthopedics;  Laterality: Left;  . SPINAL CORD STIMULATOR INSERTION N/A 09/11/2016   Procedure: Spinal cord stimulator placement;  Surgeon: Melina Schools, MD;  Location: [place];  Service: Orthopedics;  Laterality: N/A;  . TONSILLECTOMY AND ADENOIDECTOMY    . TRANSTHORACIC ECHOCARDIOGRAM  09-20-2010   normal LVF, ef 55-65%/  mild MR, TR and PR/  mild LAE  . TRANSURETHRAL RESECTION OF BLADDER TUMOR  09-21-2002   Family History  Problem Relation Age of Onset  . Heart attack Mother   . Cancer Father        lung  . Cancer Sister        lung  . Diabetes Sister   . Cancer Brother        lung that spread to brain   Social History   Socioeconomic History  . Marital status: Widowed    Spouse name: Not on file  . Number of children: 2  . Years of education: Not on file  . Highest education level: Not on file  Occupational History  . Occupation: retired from [employer]: RETIRED  Social Needs  . Financial resource strain: Not hard at all  . Food insecurity:    Worry: Never true    Inability: Never true  . Transportation needs:    Medical: No    Non-medical: No  Tobacco Use  . Smoking status: Former Smoker     Packs/day: 2.00    Years: 20.00    Pack years: 40.00    Types: Cigarettes    Last attempt to quit: 10/08/1982    Years since quitting: 35.2  . Smokeless tobacco: Never Used  Substance and Sexual Activity  . Alcohol use: Yes    Comment: rare [place] or beer  . Drug use: No  . Sexual activity: Not Currently  Lifestyle  . Physical activity:    Days per week: 3 days  Minutes per session: 30 min  . Stress: Not at all  Relationships  . Social connections:    Talks on phone: More than three times a week    Gets together: More than three times a week    Attends religious service: More than 4 times per year    Active member of club or organization: Yes    Attends meetings of clubs or organizations: More than 4 times per year    Relationship status: Widowed  Other Topics Concern  . Not on file  Social History Narrative  . Not on file    Outpatient Encounter Medications as of 12/14/2017  Medication Sig  . acetaminophen (TYLENOL) 650 MG CR tablet Take 1,300 mg by mouth at bedtime as needed for pain.  Marland Kitchen albuterol (PROVENTIL HFA;VENTOLIN HFA) 108 (90 Base) MCG/ACT inhaler Inhale 2 puffs into the lungs every 6 (six) hours as needed for wheezing or shortness of breath.  Marland Kitchen amLODipine (NORVASC) 10 MG tablet Take 1 tablet (10 mg total) by mouth daily.  . brimonidine (ALPHAGAN) 0.15 % ophthalmic solution Place 1 drop into both eyes 2 (two) times daily.  . citalopram (CELEXA) 10 MG tablet TAKE 1 TABLET EVERY MORNING  . clotrimazole (LOTRIMIN) 1 % cream Apply 1 application topically 2 (two) times daily as needed (FOR RASH). Prescribed for rash in groin  . fluticasone (FLONASE) 50 MCG/ACT nasal spray USE 2 SPRAYS IN EACH NOSTRIL EVERY DAY AS NEEDED  FOR  RHINITIS/ALLERGIES  . gabapentin (NEURONTIN) 300 MG capsule TAKE 1 TO 2 CAPSULES FOUR TIMES DAILY  AFTER MEALS AND AT BEDTIME. (Patient taking differently: Tak 4 capsule(1200 MG) in am and 8 capsules(2400 MG) at bedtime)  . hydroxypropyl  methylcellulose / hypromellose (ISOPTO TEARS / GONIOVISC) 2.5 % ophthalmic solution Place 1 drop into both eyes 2 (two) times daily.  Marland Kitchen losartan (COZAAR) 100 MG tablet Take 1 tablet (100 mg total) by mouth daily.  . meclizine (ANTIVERT) 25 MG tablet TAKE 1 TABLET TWO TIMES DAILY AS NEEDED FOR DIZZINESS  . methocarbamol (ROBAXIN) 500 MG tablet Take 1 tablet (500 mg total) by mouth 3 (three) times daily as needed for muscle spasms.  . nitroGLYCERIN (NITROSTAT) 0.4 MG SL tablet Place 1 tablet (0.4 mg total) under the tongue every 5 (five) minutes as needed. Chest pain  . Nutritional Supplements (JUICE PLUS FIBRE PO) Take 1 each 2 (two) times daily by mouth.  Marland Kitchen omeprazole (PRILOSEC) 40 MG capsule Take 1 capsule by mouth twice daily.  . ondansetron (ZOFRAN) 4 MG tablet Take 1 tablet (4 mg total) by mouth every 8 (eight) hours as needed for nausea or vomiting.  . potassium chloride (K-DUR,KLOR-CON) 10 MEQ tablet TAKE 2 TABLETS EVERY DAY  . primidone (MYSOLINE) 250 MG tablet TAKE 1/2 TABLET THREE TIMES DAILY  . simvastatin (ZOCOR) 40 MG tablet TAKE 1 AND 1/2 TABLETS AT BEDTIME (Patient taking differently: TAKE 1 in the AM AND 1/2 TABLETS AT BEDTIME)  . tamsulosin (FLOMAX) 0.4 MG CAPS capsule Take 1 capsule (0.4 mg total) by mouth daily. (Patient taking differently: Take 0.8 mg by mouth at bedtime. )  . timolol (TIMOPTIC) 0.5 % ophthalmic solution Place 1 drop into both eyes 2 (two) times daily.   . Travoprost, BAK Free, (TRAVATAN Z) 0.004 % SOLN ophthalmic solution Place 1 drop into both eyes at bedtime.  . triamcinolone (KENALOG) 0.025 % cream Apply 1 application topically 2 (two) times daily as needed (rash).   . [DISCONTINUED] hydrochlorothiazide (HYDRODIURIL) 25 MG tablet TAKE  1 TABLET EVERY DAY (Patient not taking: Reported on 12/14/2017)   No facility-administered encounter medications on file as of 12/14/2017.     Activities of Daily Living In your present state of health, do you have any difficulty  performing the following activities: 12/14/2017  Hearing? Y  Vision? N  Difficulty concentrating or making decisions? N  Walking or climbing stairs? N  Dressing or bathing? N  Doing errands, shopping? N  Preparing Food and eating ? N  Using the Toilet? N  In the past six months, have you accidently leaked urine? N  Do you have problems with loss of bowel control? N  Managing your Medications? N  Managing your Finances? N  Housekeeping or managing your Housekeeping? N  Some recent data might be hidden    Patient Care Team: Biagio Borg, MD as PCP - General   Assessment:   This is a routine wellness examination for [place]. Physical assessment deferred to PCP.   Exercise Activities and Dietary recommendations Current Exercise Habits: Home exercise routine(balance and chair exercise print-outs provided), Type of exercise: walking;calisthenics, Time (Minutes): 30, Frequency (Times/Week): 4, Weekly Exercise (Minutes/Week): 120, Intensity: Mild, Exercise limited by: orthopedic condition(s)  Diet (meal preparation, eat out, water intake, caffeinated beverages, dairy products, fruits and vegetables): in general, a "healthy" diet  , well balanced   Reviewed heart healthy diet, encouraged patient to increase daily water intake.     Goals    . Patient Stated     Stay as active and as independent as possible. Continue to do exercises to maintain strength and balance. Enjoy life and family.       Fall Risk Fall Risk  12/14/2017 10/14/2017 12/12/2016 11/12/2016 06/19/2016  Falls in the past year? Yes Yes No No No  Comment - - - - [place] Telephone Survey: data to providers prior to load  Number falls in past yr: 2 or more 2 or more - - -  Injury with Fall? No No - - -  Comment - now s/p right toe surgury - no further falls - - -  Risk Factor Category  High Fall Risk - - - -  Follow up Falls prevention discussed;Education provided - - - -    Depression Screen PHQ 2/9 Scores 12/14/2017 10/14/2017  12/12/2016 11/12/2016  PHQ - 2 Score 1 0 1 0  PHQ- 9 Score 3 - - -    Cognitive Function MMSE - Mini Mental State Exam 12/14/2017  Orientation to time 5  Orientation to Place 5  Registration 3  Attention/ Calculation 3  Recall 3  Language- name 2 objects 2  Language- repeat 1  Language- follow 3 step command 3  Language- read & follow direction 1  Write a sentence 1  Copy design 1  Total score 28        Immunization History  Administered Date(s) Administered  . Influenza Split 03/14/2012  . Influenza Whole 05/17/2007, 04/19/2013  . Influenza, High Dose Seasonal PF 04/14/2017  . Influenza,inj,Quad PF,6+ Mos 04/05/2014, 06/04/2016  . Influenza-Unspecified 05/14/2015  . Pneumococcal Conjugate-13 05/16/2014  . Pneumococcal Polysaccharide-23 05/17/2007  . Tdap 11/28/2010  . Zoster 10/25/2013    Screening Tests Health Maintenance  Topic Date Due  . INFLUENZA VACCINE  02/11/2018  . TETANUS/TDAP  11/27/2020  . PNA vac Low Risk Adult  Completed      Plan:    Continue doing brain stimulating activities (puzzles, reading, adult coloring books, staying active) to keep memory sharp.  Continue to eat heart healthy diet (full of fruits, vegetables, whole grains, lean protein, water--limit salt, fat, and sugar intake) and increase physical activity as tolerated.  I have personally reviewed and noted the following in the patient's chart:   . Medical and social history . Use of alcohol, tobacco or illicit drugs  . Current medications and supplements . Functional ability and status . Nutritional status . Physical activity . Advanced directives . List of other physicians . Vitals . Screenings to include cognitive, depression, and falls . Referrals and appointments  In addition, I have reviewed and discussed with patient certain preventive protocols, quality metrics, and best practice recommendations. A written personalized care plan for preventive services as well as general  preventive health recommendations were provided to patient.     Michiel Cowboy, RN  12/14/2017 Medical screening examination/treatment/procedure(s) were performed by non-physician practitioner and as supervising physician I was immediately available for consultation/collaboration. I agree with above. Cathlean Cower, MD

## 2017-12-14 NOTE — Patient Instructions (Addendum)
Continue doing brain stimulating activities (puzzles, reading, adult coloring books, staying active) to keep memory sharp.   Continue to eat heart healthy diet (full of fruits, vegetables, whole grains, lean protein, water--limit salt, fat, and sugar intake) and increase physical activity as tolerated.   [name] , Thank you for taking time to come for your Medicare Wellness Visit. I appreciate your ongoing commitment to your health goals. Please review the following plan we discussed and let me know if I can assist you in the future.   These are the goals we discussed: Goals    . Patient Stated     Stay as active and as independent as possible. Continue to do exercises to maintain strength and balance. Enjoy life and family.       This is a list of the screening recommended for you and due dates:  Health Maintenance  Topic Date Due  . Flu Shot  02/11/2018  . Tetanus Vaccine  11/27/2020  . Pneumonia vaccines  Completed     Health Maintenance, Male A healthy lifestyle and preventive care is important for your health and wellness. Ask your health care provider about what schedule of regular examinations is right for you. What should I know about weight and diet? Eat a Healthy Diet  Eat plenty of vegetables, fruits, whole grains, low-fat dairy products, and lean protein.  Do not eat a lot of foods high in solid fats, added sugars, or salt.  Maintain a Healthy Weight Regular exercise can help you achieve or maintain a healthy weight. You should:  Do at least 150 minutes of exercise each week. The exercise should increase your heart rate and make you sweat (moderate-intensity exercise).  Do strength-training exercises at least twice a week.  Watch Your Levels of Cholesterol and Blood Lipids  Have your blood tested for lipids and cholesterol every 5 years starting at 80 years of age. If you are at high risk for heart disease, you should start having your blood tested when you are  80 years old. You may need to have your cholesterol levels checked more often if: ? Your lipid or cholesterol levels are high. ? You are older than 80 years of age. ? You are at high risk for heart disease.  What should I know about cancer screening? Many types of cancers can be detected early and may often be prevented. Lung Cancer  You should be screened every year for lung cancer if: ? You are a current smoker who has smoked for at least 30 years. ? You are a former smoker who has quit within the past 15 years.  Talk to your health care provider about your screening options, when you should start screening, and how often you should be screened.  Colorectal Cancer  Routine colorectal cancer screening usually begins at 80 years of age and should be repeated every 5-10 years until you are 80 years old. You may need to be screened more often if early forms of precancerous polyps or small growths are found. Your health care provider may recommend screening at an earlier age if you have risk factors for colon cancer.  Your health care provider may recommend using home test kits to check for hidden blood in the stool.  A small camera at the end of a tube can be used to examine your colon (sigmoidoscopy or colonoscopy). This checks for the earliest forms of colorectal cancer.  Prostate and Testicular Cancer  Depending on your age and overall health,  your health care provider may do certain tests to screen for prostate and testicular cancer.  Talk to your health care provider about any symptoms or concerns you have about testicular or prostate cancer.  Skin Cancer  Check your skin from head to toe regularly.  Tell your health care provider about any new moles or changes in moles, especially if: ? There is a change in a mole's size, shape, or color. ? You have a mole that is larger than a pencil eraser.  Always use sunscreen. Apply sunscreen liberally and repeat throughout the  day.  Protect yourself by wearing long sleeves, pants, a wide-brimmed hat, and sunglasses when outside.  What should I know about heart disease, diabetes, and high blood pressure?  If you are 21-61 years of age, have your blood pressure checked every 3-5 years. If you are 32 years of age or older, have your blood pressure checked every year. You should have your blood pressure measured twice-once when you are at a hospital or clinic, and once when you are not at a hospital or clinic. Record the average of the two measurements. To check your blood pressure when you are not at a hospital or clinic, you can use: ? An automated blood pressure machine at a pharmacy. ? A home blood pressure monitor.  Talk to your health care provider about your target blood pressure.  If you are between 35-27 years old, ask your health care provider if you should take aspirin to prevent heart disease.  Have regular diabetes screenings by checking your fasting blood sugar level. ? If you are at a normal weight and have a low risk for diabetes, have this test once every three years after the age of 38. ? If you are overweight and have a high risk for diabetes, consider being tested at a younger age or more often.  A one-time screening for abdominal aortic aneurysm (AAA) by ultrasound is recommended for men aged 48-75 years who are current or former smokers. What should I know about preventing infection? Hepatitis B If you have a higher risk for hepatitis B, you should be screened for this virus. Talk with your health care provider to find out if you are at risk for hepatitis B infection. Hepatitis C Blood testing is recommended for:  Everyone born from [birth year] through 1963.  Anyone with known risk factors for hepatitis C.  Sexually Transmitted Diseases (STDs)  You should be screened each year for STDs including gonorrhea and chlamydia if: ? You are sexually active and are younger than 80 years of age. ? You are  older than 80 years of age and your health care provider tells you that you are at risk for this type of infection. ? Your sexual activity has changed since you were last screened and you are at an increased risk for chlamydia or gonorrhea. Ask your health care provider if you are at risk.  Talk with your health care provider about whether you are at high risk of being infected with HIV. Your health care provider may recommend a prescription medicine to help prevent HIV infection.  What else can I do?  Schedule regular health, dental, and eye exams.  Stay current with your vaccines (immunizations).  Do not use any tobacco products, such as cigarettes, chewing tobacco, and e-cigarettes. If you need help quitting, ask your health care provider.  Limit alcohol intake to no more than 2 drinks per day. One drink equals 12 ounces of beer, 5 ounces  of wine, or 1 ounces of hard liquor.  Do not use street drugs.  Do not share needles.  Ask your health care provider for help if you need support or information about quitting drugs.  Tell your health care provider if you often feel depressed.  Tell your health care provider if you have ever been abused or do not feel safe at home. This information is not intended to replace advice given to you by your health care provider. Make sure you discuss any questions you have with your health care provider. Document Released: 12/27/2007 Document Revised: 02/27/2016 Document Reviewed: 04/03/2015 Elsevier Interactive Patient Education  [employer].

## 2017-12-18 ENCOUNTER — Encounter: Payer: Self-pay | Admitting: Podiatry

## 2017-12-18 ENCOUNTER — Ambulatory Visit (INDEPENDENT_AMBULATORY_CARE_PROVIDER_SITE_OTHER): Payer: Medicare Other | Admitting: Podiatry

## 2017-12-18 DIAGNOSIS — S61412A Laceration without foreign body of left hand, initial encounter: Secondary | ICD-10-CM | POA: Diagnosis not present

## 2017-12-18 DIAGNOSIS — M79642 Pain in left hand: Secondary | ICD-10-CM | POA: Diagnosis not present

## 2017-12-18 DIAGNOSIS — M79676 Pain in unspecified toe(s): Secondary | ICD-10-CM

## 2017-12-18 DIAGNOSIS — B351 Tinea unguium: Secondary | ICD-10-CM

## 2017-12-18 DIAGNOSIS — S61419A Laceration without foreign body of unspecified hand, initial encounter: Secondary | ICD-10-CM | POA: Insufficient documentation

## 2017-12-20 NOTE — Progress Notes (Signed)
Subjective:   Patient ID: [name], male   DOB: 82 y.o.   MRN: [id number]   HPI Patient presents with elongated nailbeds 1-5 both feet that are thick and painful and he cannot cut   ROS      Objective:  Physical Exam  Neurovascular status was intact with thick yellow brittle nailbeds 1-5 both feet     Assessment:  Mycotic nail infection with pain 1-5 both feet     Plan:  Debride painful nailbeds 1-5 both feet with no iatrogenic bleeding noted

## 2017-12-21 ENCOUNTER — Encounter: Payer: Self-pay | Admitting: Gastroenterology

## 2017-12-25 DIAGNOSIS — S61412A Laceration without foreign body of left hand, initial encounter: Secondary | ICD-10-CM | POA: Diagnosis not present

## 2017-12-29 ENCOUNTER — Encounter: Payer: Self-pay | Admitting: Internal Medicine

## 2018-01-06 ENCOUNTER — Ambulatory Visit: Payer: Self-pay | Admitting: *Deleted

## 2018-01-06 NOTE — Telephone Encounter (Signed)
Pt's daughter in law, Crystal calling with the pt beside her to report that the pt has been having increased BP reading. Crystal states that last Thursday the pt became upset with  brother in law and the ambulance had to come out to the pt's home. Pt SBP was over the 200s and the pt states he was unable to pick of the phone to call 911 at the time due to weakness. Pt refused to be taken to the ED on that day. Pt was taken to the home of his daugher in law and son on Friday and his SBP was noted to be ranging between 150-170s. Pt had been taken 2-100mg  Losartan to try to decrease BP even though current prescription states to take one tab daily. Pt denies missing any doses of his BP medications. Pt found out on yesterday that his sister passed away and the funeral is on tomorrow around 2 pm. Pt also notified today that his nephew, his sister's son, has suffered a heart attack and the family does not know if he is going to survive. Pt's daughter in law states that the pt is pacing and worried and wants to know if something could be called in for anxiety. Pt was prescribed medication previously last year when his wife passed but the name of the medication is not known. Pt is currently with daughter in law in [place] and if possible would like for medication to be sent to [facility] in [place]. Pt scheduled for appt on 01/08/18 with Dr. Jenny Reichmann due to his sister's funeral being on tomorrow. Pt advised that if he became worse during the night to go to the ED for treatment or to call the office back with any other concerns. Pt and daughter in law verbalized understanding.  Reason for Disposition . Systolic BP  >= 945 OR Diastolic >= 038  Answer Assessment - Initial Assessment Questions 1. BLOOD PRESSURE: "What is the blood pressure?" "Did you take at least two measurements 5 minutes apart?"     Last BP was 161/80 2. ONSET: "When did you take your blood pressure?"     Prior to calling the office 3. HOW: "How did  you obtain the blood pressure?" (e.g., visiting nurse, automatic home BP monitor)     Not specified 4. HISTORY: "Do you have a history of high blood pressure?"     yes 5. MEDICATIONS: "Are you taking any medications for blood pressure?" "Have you missed any doses recently?"     No 6. OTHER SYMPTOMS: "Do you have any symptoms?" (e.g., headache, chest pain, blurred vision, difficulty breathing, weakness)     Not at this time, hx of dizziness previously and is currently experiencing some dizziness that comes and goes at this time.  Protocols used: HIGH BLOOD PRESSURE-A-AH

## 2018-01-07 MED ORDER — CLONAZEPAM 0.5 MG PO TABS: 0.5000 mg | ORAL_TABLET | Freq: Two times a day (BID) | ORAL | 0 refills | Status: DC | PRN

## 2018-01-07 NOTE — Telephone Encounter (Addendum)
I reviewed the old med list, but not sure which med this was last yr, unless this refers to celexa  At any rate, ok for klonopin 0.5 bid prn - done erx to [facility] on [address]

## 2018-01-07 NOTE — Addendum Note (Signed)
Addended by: Biagio Borg on: 01/07/2018 10:21 AM   Modules accepted: Orders

## 2018-01-08 ENCOUNTER — Encounter: Payer: Self-pay | Admitting: Internal Medicine

## 2018-01-08 ENCOUNTER — Ambulatory Visit (INDEPENDENT_AMBULATORY_CARE_PROVIDER_SITE_OTHER): Payer: Medicare Other | Admitting: Internal Medicine

## 2018-01-08 VITALS — BP 128/84 | HR 54 | Temp 97.7°F | Ht 67.0 in | Wt 187.0 lb

## 2018-01-08 DIAGNOSIS — F419 Anxiety disorder, unspecified: Secondary | ICD-10-CM

## 2018-01-08 DIAGNOSIS — I10 Essential (primary) hypertension: Secondary | ICD-10-CM

## 2018-01-08 DIAGNOSIS — J309 Allergic rhinitis, unspecified: Secondary | ICD-10-CM | POA: Diagnosis not present

## 2018-01-08 NOTE — Assessment & Plan Note (Signed)
Ok to add mucinex otc prn,  to f/u any worsening symptoms or concerns

## 2018-01-08 NOTE — Progress Notes (Signed)
Subjective:    Patient ID: [name], male    DOB: Jun 11, 1936, 82 y.o.   MRN: [id number]  HPI  Here to f/u; overall doing ok,  Pt denies chest pain, increasing sob or doe, wheezing, orthopnea, PND, increased LE swelling, palpitations, dizziness or syncope.  Pt denies new neurological symptoms such as new headache, or facial or extremity weakness or numbness.  Pt denies polydipsia, polyuria, or low sugar episode.  Pt states overall good compliance with meds, mostly trying to follow appropriate diet, with wt overall stable,  but little exercise however.  Has several family with severe ill and deaths with increased upset and more labile HTN.   Also has planned CNS imaging and vestibular rehab scheduled through the [place].  Has been living with daughter for 1 wk, had 1 fall with abrasion with cellulitis tx last wk, now resolved  Clonazepam 0.5 helps but also makes him "drunk and stupid" Wt Readings from Last 3 Encounters:  01/08/18 187 lb (84.8 kg)  12/14/17 190 lb (86.2 kg)  11/18/17 190 lb (86.2 kg)   BP Readings from Last 3 Encounters:  01/08/18 128/84  12/14/17 (!) 154/75  11/18/17 (!) 146/88   Past Medical History:  Diagnosis Date  . Acute bronchitis per pt no fever--  cough since discharge from hospital 07-02-2015   per pcp note 07-05-2015  mild to moderate bronchitis vs pna  . Allergic rhinitis   . Bladder cancer ([place])   . BPH (benign prostatic hyperplasia)   . Bradycardia   . CAD (coronary artery disease)   . Chronic low back pain   . DDD (degenerative disc disease), lumbosacral   . Depression   . Diverticulosis of colon (without mention of hemorrhage)   . Dyspnea   . Essential tremor   . Fatty liver disease, nonalcoholic   . GERD (gastroesophageal reflux disease)   . Glaucoma   . History of bladder cancer    2004--  TCC low-grade , non-invasive  . History of colon polyps   . History of exercise stress test    02-05-2007-- ETT (Clinically positive, electrically equivocal,  submaximal ETT,  appropriate bp response to exercise  . History of hiatal hernia   . History of kidney stones   . History of peptic ulcer   . HLD (hyperlipidemia)   . HTN (hypertension)   . Nocturia   . Productive cough   . Rheumatoid arthritis(714.0)   . Right renal artery stenosis (H[facility])    per cath 03-14-2009 --- 40-50%  . RLS (restless legs syndrome)   . Vertigo, intermittent   . Weak urinary stream   . Wears dentures    upper  . Wears glasses   . Wears hearing aid    bilateral  . Wears partial dentures    lower   Past Surgical History:  Procedure Laterality Date  . CARDIAC CATHETERIZATION  02-12-2007  dr gregg taylor   Non-obstructive CAD,  20% pLCX,  20% LAD, ef 65%  . CARDIAC CATHETERIZATION  03-14-2009  dr Johnsie Cancel   pLAD and mid diaginal 20-30% multiple lesions,  OM1 20%, right renal ostial 40-50%,  ef 60%  . CATARACT EXTRACTION W/ INTRAOCULAR LENS  IMPLANT, BILATERAL  2016  . CYSTOSCOPY WITH BIOPSY N/A 07/11/2015   Procedure: CYSTOSCOPY WITH COLD CUP RESECTION AND FULGERATION;  Surgeon: Rana Snare, MD;  Location: [facility];  Service: Urology;  Laterality: N/A;  . HIP ARTHROSCOPY W/ LABRAL DEBRIDEMENT Left 06-29-2000   and chondraplasty  .  LUMBAR [place] SURGERY  07-16-2005  . LUMBAR LAMINECTOMY/DECOMPRESSION MICRODISCECTOMY  07/09/2011   Procedure: LUMBAR LAMINECTOMY/DECOMPRESSION MICRODISCECTOMY;  Surgeon: Johnn Hai;  Location: [facility];  Service: Orthopedics;  Laterality: Left;  Decompression L5 - S1 on the Left and repair of dura  . LUMBAR LAMINECTOMY/DECOMPRESSION MICRODISCECTOMY N/A 07/27/2013   Procedure: DECOMPRESSION L4 - L5 WITH EXCISION OF SYNOVIAL CYST AND, LATERAL MASS FUSION 1 LEVEL;  Surgeon: Johnn Hai, MD;  Location: [facility];  Service: Orthopedics;  Laterality: N/A;  . LUMBAR LAMINECTOMY/DECOMPRESSION MICRODISCECTOMY Left 07/06/2014   Procedure:  MICRO LUMBAR DECOMPRESSION L5-S1 ON LEFT, L4-5 REDO;  Surgeon: Johnn Hai, MD;   Location: [facility];  Service: Orthopedics;  Laterality: Left;  . SPINAL CORD STIMULATOR INSERTION N/A 09/11/2016   Procedure: Spinal cord stimulator placement;  Surgeon: Melina Schools, MD;  Location: [facility];  Service: Orthopedics;  Laterality: N/A;  . TONSILLECTOMY AND ADENOIDECTOMY    . TRANSTHORACIC ECHOCARDIOGRAM  09-20-2010   normal LVF, ef 55-65%/  mild MR, TR and PR/  mild LAE  . TRANSURETHRAL RESECTION OF BLADDER TUMOR  09-21-2002    reports that he quit smoking about 35 years ago. His smoking use included cigarettes. He has a 40.00 pack-year smoking history. He has never used smokeless tobacco. He reports that he drinks alcohol. He reports that he does not use drugs. family history includes Cancer in his brother, father, and sister; Diabetes in his sister; Heart attack in his mother. Allergies  Allergen Reactions  . Morphine And Related Other (See Comments)    Migraine   . Hydrochlorothiazide Other (See Comments)    REACTION: hyponatremia with daily use  . Tape Itching    Patient  Is ok to use paper tape  . Latex Rash  . Lisinopril Cough  . Sulfa Antibiotics Rash   Current Outpatient Medications on File Prior to Visit  Medication Sig Dispense Refill  . acetaminophen (TYLENOL) 650 MG CR tablet Take 1,300 mg by mouth at bedtime as needed for pain.    Marland Kitchen albuterol (PROVENTIL HFA;VENTOLIN HFA) 108 (90 Base) MCG/ACT inhaler Inhale 2 puffs into the lungs every 6 (six) hours as needed for wheezing or shortness of breath. 54 g 3  . amLODipine (NORVASC) 10 MG tablet Take 1 tablet (10 mg total) by mouth daily. 90 tablet 3  . brimonidine (ALPHAGAN) 0.15 % ophthalmic solution Place 1 drop into both eyes 2 (two) times daily.    . citalopram (CELEXA) 10 MG tablet TAKE 1 TABLET EVERY MORNING 90 tablet 3  . clonazePAM (KLONOPIN) 0.5 MG tablet Take 1 tablet (0.5 mg total) by mouth 2 (two) times daily as needed for anxiety. 60 tablet 0  . clotrimazole (LOTRIMIN) 1 % cream Apply 1 application topically  2 (two) times daily as needed (FOR RASH). Prescribed for rash in groin    . fluticasone (FLONASE) 50 MCG/ACT nasal spray USE 2 SPRAYS IN EACH NOSTRIL EVERY DAY AS NEEDED  FOR  RHINITIS/ALLERGIES 48 g 3  . gabapentin (NEURONTIN) 300 MG capsule TAKE 1 TO 2 CAPSULES FOUR TIMES DAILY  AFTER MEALS AND AT BEDTIME. (Patient taking differently: Tak 4 capsule(1200 MG) in am and 8 capsules(2400 MG) at bedtime) 720 capsule 3  . hydroxypropyl methylcellulose / hypromellose (ISOPTO TEARS / GONIOVISC) 2.5 % ophthalmic solution Place 1 drop into both eyes 2 (two) times daily.    Marland Kitchen losartan (COZAAR) 100 MG tablet Take 1 tablet (100 mg total) by mouth daily. 90 tablet 3  . meclizine (ANTIVERT) 25  MG tablet TAKE 1 TABLET TWO TIMES DAILY AS NEEDED FOR DIZZINESS 180 tablet 1  . methocarbamol (ROBAXIN) 500 MG tablet Take 1 tablet (500 mg total) by mouth 3 (three) times daily as needed for muscle spasms. 21 tablet 0  . nitroGLYCERIN (NITROSTAT) 0.4 MG SL tablet Place 1 tablet (0.4 mg total) under the tongue every 5 (five) minutes as needed. Chest pain 60 tablet 5  . Nutritional Supplements (JUICE PLUS FIBRE PO) Take 1 each 2 (two) times daily by mouth.    Marland Kitchen omeprazole (PRILOSEC) 40 MG capsule Take 1 capsule by mouth twice daily. 180 capsule 3  . ondansetron (ZOFRAN) 4 MG tablet Take 1 tablet (4 mg total) by mouth every 8 (eight) hours as needed for nausea or vomiting. 20 tablet 0  . potassium chloride (K-DUR,KLOR-CON) 10 MEQ tablet TAKE 2 TABLETS EVERY DAY 180 tablet 1  . primidone (MYSOLINE) 250 MG tablet TAKE 1/2 TABLET THREE TIMES DAILY 135 tablet 2  . simvastatin (ZOCOR) 40 MG tablet TAKE 1 AND 1/2 TABLETS AT BEDTIME (Patient taking differently: TAKE 1 in the AM AND 1/2 TABLETS AT BEDTIME) 135 tablet 2  . tamsulosin (FLOMAX) 0.4 MG CAPS capsule Take 1 capsule (0.4 mg total) by mouth daily. (Patient taking differently: Take 0.8 mg by mouth at bedtime. ) 90 capsule 3  . timolol (TIMOPTIC) 0.5 % ophthalmic solution Place  1 drop into both eyes 2 (two) times daily.   1  . Travoprost, BAK Free, (TRAVATAN Z) 0.004 % SOLN ophthalmic solution Place 1 drop into both eyes at bedtime. 5 mL 5  . triamcinolone (KENALOG) 0.025 % cream Apply 1 application topically 2 (two) times daily as needed (rash).      No current facility-administered medications on file prior to visit.    Review of Systems  Constitutional: Negative for other unusual diaphoresis or sweats HENT: Negative for ear discharge or swelling Eyes: Negative for other worsening visual disturbances Respiratory: Negative for stridor or other swelling  Gastrointestinal: Negative for worsening distension or other blood Genitourinary: Negative for retention or other urinary change Musculoskeletal: Negative for other MSK pain or swelling Skin: Negative for color change or other new lesions Neurological: Negative for worsening tremors and other numbness  Psychiatric/Behavioral: Negative for worsening agitation or other fatigue All other system neg per pt    Objective:   Physical Exam BP 128/84   Pulse (!) 54   Temp 97.7 F (36.5 C) (Oral)   Ht 5\' 7"  (1.702 m)   Wt 187 lb (84.8 kg)   SpO2 98%   BMI 29.29 kg/m  VS noted, somewhat unsteady with gait, walks with cane, cp dizziness Constitutional: Pt appears in NAD HENT: Head: NCAT.  Right Ear: External ear normal.  Left Ear: External ear normal. Left TM with mild erythema and effusion Eyes: . Pupils are equal, round, and reactive to light. Conjunctivae and EOM are normal Nose: without d/c or deformity Neck: Neck supple. Gross normal ROM Cardiovascular: Normal rate and regular rhythm.   Pulmonary/Chest: Effort normal and breath sounds without rales or wheezing.  Abd:  Soft, NT, ND, + BS, no organomegaly Neurological: Pt is alert. At baseline orientation, motor grossly intact Skin: Skin is warm. No rashes, other new lesions, no LE edema Psychiatric: Pt behavior is normal without agitation  No other exam  findings  Lab Results  Component Value Date   WBC 6.1 10/14/2017   HGB 14.8 10/14/2017   HCT 43.0 10/14/2017   PLT 222.0 10/14/2017  GLUCOSE 90 10/14/2017   CHOL 146 10/14/2017   TRIG 167.0 (H) 10/14/2017   HDL 47.60 10/14/2017   LDLDIRECT 93.6 10/28/2012   LDLCALC 65 10/14/2017   ALT 45 10/14/2017   AST 31 10/14/2017   NA 137 10/14/2017   K 4.5 10/14/2017   CL 102 10/14/2017   CREATININE 0.78 10/14/2017   BUN 13 10/14/2017   CO2 29 10/14/2017   TSH 3.04 10/14/2017   PSA 0.53 11/12/2016   INR 1.10 07/01/2015   HGBA1C 5.4 10/14/2017        Assessment & Plan:

## 2018-01-08 NOTE — Assessment & Plan Note (Signed)
Ok to try half clonazepam if can tolerate, o/w to call for change to xanax prn,  to f/u any worsening symptoms or concerns

## 2018-01-08 NOTE — Patient Instructions (Signed)
You can take OTC Mucinex (or it's generic off brand) for congestion, and tylenol as needed for pain.  OK to try Half of the clonazepam, but if still side effects we can consider changing this  Please continue all other medications as before, including the blood pressure medications  Please continue to follow with the [facility] for the testing and treatment  Please have the pharmacy call with any other refills you may need.  Please continue your efforts at being more active, low cholesterol diet, and weight control.  Please keep your appointments with your specialists as you may have planned

## 2018-01-08 NOTE — Assessment & Plan Note (Signed)
Somewhat labile but overall stable, to avoid high emotion if possible, cont same med tx

## 2018-01-26 DIAGNOSIS — L821 Other seborrheic keratosis: Secondary | ICD-10-CM | POA: Diagnosis not present

## 2018-01-26 DIAGNOSIS — D225 Melanocytic nevi of trunk: Secondary | ICD-10-CM | POA: Diagnosis not present

## 2018-01-26 DIAGNOSIS — L814 Other melanin hyperpigmentation: Secondary | ICD-10-CM | POA: Diagnosis not present

## 2018-01-26 DIAGNOSIS — Z85828 Personal history of other malignant neoplasm of skin: Secondary | ICD-10-CM | POA: Diagnosis not present

## 2018-01-29 ENCOUNTER — Other Ambulatory Visit: Payer: Self-pay | Admitting: Internal Medicine

## 2018-01-29 DIAGNOSIS — N3001 Acute cystitis with hematuria: Secondary | ICD-10-CM | POA: Diagnosis not present

## 2018-02-19 ENCOUNTER — Ambulatory Visit: Payer: Medicare Other | Admitting: Podiatry

## 2018-02-23 ENCOUNTER — Emergency Department (HOSPITAL_COMMUNITY): Payer: Medicare Other

## 2018-02-23 ENCOUNTER — Other Ambulatory Visit: Payer: Self-pay | Admitting: Internal Medicine

## 2018-02-23 ENCOUNTER — Emergency Department (HOSPITAL_COMMUNITY)
Admission: EM | Admit: 2018-02-23 | Discharge: 2018-02-23 | Disposition: A | Discharge status: Home / Self Care | Payer: Medicare Other | Attending: Emergency Medicine | Admitting: Emergency Medicine

## 2018-02-23 ENCOUNTER — Ambulatory Visit: Payer: Self-pay | Admitting: Internal Medicine

## 2018-02-23 ENCOUNTER — Other Ambulatory Visit: Payer: Self-pay

## 2018-02-23 ENCOUNTER — Encounter (HOSPITAL_COMMUNITY): Payer: Self-pay | Admitting: *Deleted

## 2018-02-23 DIAGNOSIS — Z8551 Personal history of malignant neoplasm of bladder: Secondary | ICD-10-CM | POA: Diagnosis not present

## 2018-02-23 DIAGNOSIS — R3912 Poor urinary stream: Secondary | ICD-10-CM | POA: Insufficient documentation

## 2018-02-23 DIAGNOSIS — Z79899 Other long term (current) drug therapy: Secondary | ICD-10-CM | POA: Insufficient documentation

## 2018-02-23 DIAGNOSIS — I2510 Atherosclerotic heart disease of native coronary artery without angina pectoris: Secondary | ICD-10-CM | POA: Insufficient documentation

## 2018-02-23 DIAGNOSIS — I110 Hypertensive heart disease with heart failure: Secondary | ICD-10-CM | POA: Diagnosis not present

## 2018-02-23 DIAGNOSIS — I160 Hypertensive urgency: Secondary | ICD-10-CM | POA: Diagnosis not present

## 2018-02-23 DIAGNOSIS — I10 Essential (primary) hypertension: Secondary | ICD-10-CM | POA: Diagnosis present

## 2018-02-23 DIAGNOSIS — I509 Heart failure, unspecified: Secondary | ICD-10-CM | POA: Diagnosis not present

## 2018-02-23 DIAGNOSIS — R42 Dizziness and giddiness: Secondary | ICD-10-CM | POA: Insufficient documentation

## 2018-02-23 DIAGNOSIS — R51 Headache: Secondary | ICD-10-CM | POA: Diagnosis not present

## 2018-02-23 DIAGNOSIS — H538 Other visual disturbances: Secondary | ICD-10-CM | POA: Diagnosis not present

## 2018-02-23 LAB — COMPREHENSIVE METABOLIC PANEL
ALT: 39 U/L (ref 0–44)
AST: 35 U/L (ref 15–41)
Albumin: 3.9 g/dL (ref 3.5–5.0)
Alkaline Phosphatase: 78 U/L (ref 38–126)
Anion gap: 10 (ref 5–15)
BILIRUBIN TOTAL: 0.5 mg/dL (ref 0.3–1.2)
BUN: 16 mg/dL (ref 8–23)
CO2: 25 mmol/L (ref 22–32)
Calcium: 8.8 mg/dL — ABNORMAL LOW (ref 8.9–10.3)
Chloride: 105 mmol/L (ref 98–111)
Creatinine, Ser: 0.79 mg/dL (ref 0.61–1.24)
Glucose, Bld: 103 mg/dL — ABNORMAL HIGH (ref 70–99)
POTASSIUM: 4.5 mmol/L (ref 3.5–5.1)
Sodium: 140 mmol/L (ref 135–145)
TOTAL PROTEIN: 6.7 g/dL (ref 6.5–8.1)

## 2018-02-23 LAB — URINALYSIS, ROUTINE W REFLEX MICROSCOPIC
BILIRUBIN URINE: NEGATIVE
Glucose, UA: NEGATIVE mg/dL
Ketones, ur: NEGATIVE mg/dL
Nitrite: NEGATIVE
PROTEIN: NEGATIVE mg/dL
Specific Gravity, Urine: 1.006 (ref 1.005–1.030)
WBC, UA: >50 WBC/hpf — ABNORMAL HIGH (ref 0–5)
pH: 7 (ref 5.0–8.0)

## 2018-02-23 LAB — APTT: aPTT: 34 seconds (ref 24–36)

## 2018-02-23 LAB — CBC
HEMATOCRIT: 42.6 % (ref 39.0–52.0)
Hemoglobin: 14.6 g/dL (ref 13.0–17.0)
MCH: 31.7 pg (ref 26.0–34.0)
MCHC: 34.3 g/dL (ref 30.0–36.0)
MCV: 92.4 fL (ref 78.0–100.0)
Platelets: 212 10*3/uL (ref 150–400)
RBC: 4.61 MIL/uL (ref 4.22–5.81)
RDW: 12.9 % (ref 11.5–15.5)
WBC: 6.4 10*3/uL (ref 4.0–10.5)

## 2018-02-23 LAB — DIFFERENTIAL
BASOS ABS: 0 10*3/uL (ref 0.0–0.1)
Basophils Relative: 0 %
EOS ABS: 0.1 10*3/uL (ref 0.0–0.7)
EOS PCT: 2 %
LYMPHS ABS: 0.8 10*3/uL (ref 0.7–4.0)
Lymphocytes Relative: 13 %
MONO ABS: 0.6 10*3/uL (ref 0.1–1.0)
MONOS PCT: 9 %
Neutro Abs: 4.8 10*3/uL (ref 1.7–7.7)
Neutrophils Relative %: 76 %

## 2018-02-23 LAB — PROTIME-INR
INR: 1.15
Prothrombin Time: 14.6 seconds (ref 11.4–15.2)

## 2018-02-23 LAB — RAPID URINE DRUG SCREEN, HOSP PERFORMED
Amphetamines: NOT DETECTED
BARBITURATES: POSITIVE — AB
Benzodiazepines: NOT DETECTED
Cocaine: NOT DETECTED
Tetrahydrocannabinol: NOT DETECTED

## 2018-02-23 LAB — I-STAT TROPONIN, ED: TROPONIN I, POC: 0.01 ng/mL (ref 0.00–0.08)

## 2018-02-23 MED ORDER — SODIUM CHLORIDE 0.9 % IV SOLN
100.0000 mL/h | INTRAVENOUS | Status: DC
  Administered 2018-02-23: 100 mL/h via INTRAVENOUS

## 2018-02-23 MED ORDER — SODIUM CHLORIDE 0.9 % IV BOLUS
500.0000 mL | Freq: Once | INTRAVENOUS | Status: AC
  Administered 2018-02-23: 500 mL via INTRAVENOUS

## 2018-02-23 MED ORDER — CIPROFLOXACIN HCL 500 MG PO TABS
500.0000 mg | ORAL_TABLET | Freq: Once | ORAL | Status: AC
  Administered 2018-02-23: 500 mg via ORAL
  Filled 2018-02-23: qty 1

## 2018-02-23 MED ORDER — CIPROFLOXACIN HCL 500 MG PO TABS: 500.0000 mg | ORAL_TABLET | Freq: Two times a day (BID) | ORAL | 0 refills | Status: DC

## 2018-02-23 MED ORDER — HYDRALAZINE HCL 20 MG/ML IJ SOLN
5.0000 mg | INTRAMUSCULAR | Status: AC
  Administered 2018-02-23: 5 mg via INTRAVENOUS

## 2018-02-23 MED ORDER — HYDRALAZINE HCL 20 MG/ML IJ SOLN
5.0000 mg | INTRAMUSCULAR | Status: AC
  Administered 2018-02-23: 5 mg via INTRAVENOUS
  Filled 2018-02-23: qty 1

## 2018-02-23 MED ORDER — ACETAMINOPHEN 500 MG PO TABS
1000.0000 mg | ORAL_TABLET | Freq: Once | ORAL | Status: AC
  Administered 2018-02-23: 1000 mg via ORAL
  Filled 2018-02-23: qty 2

## 2018-02-23 MED ORDER — HYDROCHLOROTHIAZIDE 50 MG PO TABS: 50.0000 mg | ORAL_TABLET | Freq: Every day | ORAL | 0 refills | Status: DC

## 2018-02-23 NOTE — Discharge Instructions (Addendum)
As discussed, your evaluation today has been largely reassuring.  But, it is important that you monitor your condition carefully, and do not hesitate to return to the ED if you develop new, or concerning changes in your condition.  Recall that we have changed your blood pressure medication, and added an antibiotic. Urine culture results should be available within 48 hours, and if they are normal, you may stop the antibiotic. However, please take your new blood pressure medication regimen and discuss this with your physician.

## 2018-02-23 NOTE — ED Notes (Signed)
Pt made aware urine needed. Pt provided with urinal 

## 2018-02-23 NOTE — ED Notes (Signed)
Near visual acuity screening performed: 20/200 OD; 20/400 OS uncorrected. Patient states they normally wear glasses but do not have them with them.

## 2018-02-23 NOTE — ED Triage Notes (Signed)
Pt arrives via pov from home. Pt reports hypertension, vomiting, blurred vision and headache begininng today. Pt reports hx of hypertension and dizziness. Pt reports the dizziness is no worse than baseline. Pt reports blurred vision that began today. Pt reports 5 episodes of vomiting. Pt denies chest pain.

## 2018-02-23 NOTE — Telephone Encounter (Signed)
   Reason for Disposition . [9] Systolic BP  >= 242 OR Diastolic >= 683 AND [4] cardiac or neurologic symptoms (e.g., chest pain, difficulty breathing, unsteady gait, blurred vision)  Answer Assessment - Initial Assessment Questions 1. BLOOD PRESSURE: "What is the blood pressure?" "Did you take at least two measurements 5 minutes apart?"     181/100, 167/86 2. ONSET: "When did you take your blood pressure?"     2:00  3. HOW: "How did you obtain the blood pressure?" (e.g., visiting nurse, automatic home BP monitor)     Automatic cuff 4. HISTORY: "Do you have a history of high blood pressure?"     yes 5. MEDICATIONS: "Are you taking any medications for blood pressure?" "Have you missed any doses recently?"     Yes- takes as needed 6. OTHER SYMPTOMS: "Do you have any symptoms?" (e.g., headache, chest pain, blurred vision, difficulty breathing, weakness)     Vomited, headache, blurred vision 7. PREGNANCY: "Is there any chance you are pregnant?" "When was your last menstrual period?"     n/a  Protocols used: HIGH BLOOD PRESSURE-A-AH

## 2018-02-23 NOTE — ED Notes (Signed)
Pt family assisted pt to restroom, missed urine sample.

## 2018-02-23 NOTE — ED Provider Notes (Signed)
[facility] DEPT Provider Note   CSN: [id number] Arrival date & time: 02/23/18  1439     History   Chief Complaint Chief Complaint  Patient presents with  . Hypertension  . Emesis  . Blurred Vision    HPI [name] is a 82 y.o. male.  HPI Patient presents with his family who assists with the HPI. Patient has chronic dizziness, notes that this is about the same as usual, but with the addition of new blurry vision, and posterior head pain today, family became concerned. Patient was in his usual state of health last night, last normal about 18 hours ago. He noticed feeling poorly upon awakening, subsequently developed blurry vision, posterior headache, nausea, had several episodes of vomiting. No new weakness in any extremity, no no confusion, noted disorientation. Patient took all medication including antihypertensive as directed. He does not take blood thinning medication. He does have a history of cancer in the distant past.  Past Medical History:  Diagnosis Date  . Acute bronchitis per pt no fever--  cough since discharge from hospital 07-02-2015   per pcp note 07-05-2015  mild to moderate bronchitis vs pna  . Allergic rhinitis   . Bladder cancer ([place])   . BPH (benign prostatic hyperplasia)   . Bradycardia   . CAD (coronary artery disease)   . Chronic low back pain   . DDD (degenerative disc disease), lumbosacral   . Depression   . Diverticulosis of colon (without mention of hemorrhage)   . Dyspnea   . Essential tremor   . Fatty liver disease, nonalcoholic   . GERD (gastroesophageal reflux disease)   . Glaucoma   . History of bladder cancer    2004--  TCC low-grade , non-invasive  . History of colon polyps   . History of exercise stress test    02-05-2007-- ETT (Clinically positive, electrically equivocal, submaximal ETT,  appropriate bp response to exercise  . History of hiatal hernia   . History of kidney stones   . History  of peptic ulcer   . HLD (hyperlipidemia)   . HTN (hypertension)   . Nocturia   . Productive cough   . Rheumatoid arthritis(714.0)   . Right renal artery stenosis (HCC)    per cath 03-14-2009 --- 40-50%  . RLS (restless legs syndrome)   . Vertigo, intermittent   . Weak urinary stream   . Wears dentures    upper  . Wears glasses   . Wears hearing aid    bilateral  . Wears partial dentures    lower    Patient Active Problem List   Diagnosis Date Noted  . Anxiety 01/08/2018  . Laceration of scalp without foreign body 06/09/2017  . Nonintractable headache 06/09/2017  . Acute sinus infection 04/14/2017  . Nausea and vomiting 03/27/2017  . Hyponatremia 03/17/2017  . Right lumbar radiculopathy 01/21/2017  . Fall 01/21/2017  . Synovial cyst 01/21/2017  . Chronic pain 09/11/2016  . Generalized weakness 05/22/2016  . UTI (urinary tract infection) 05/22/2016  . Weakness 05/22/2016  . Fever blister 10/18/2015  . Dyspnea 10/17/2015  . Bradycardia 09/17/2015  . Chest pain at rest 09/17/2015  . CAD in native artery 09/17/2015  . CHF (congestive heart failure) ([place]) 09/17/2015  . Pulmonary hypertension ([place]) 09/17/2015  . Bladder cancer ([place]) 09/17/2015  . Essential tremor 09/17/2015  . Chest pain 09/17/2015  . Anginal chest pain at rest [facility]) 09/17/2015  . Hypokalemia 07/13/2015  . Vertigo 07/01/2015  .  Dizzinesses 07/01/2015  . Spinal stenosis of lumbar region at multiple levels 07/06/2014  . Angular cheilitis 05/05/2014  . Left lumbar radiculopathy 05/04/2014  . Right elbow pain 12/29/2013  . Lateral epicondylitis of right elbow 12/29/2013  . Cough 09/14/2013  . Spinal stenosis, lumbar 07/27/2013  . Palpable mass of neck 07/21/2013  . Vasovagal episode 01/10/2013  . Left otitis media 10/28/2012  . Impaired glucose tolerance 10/28/2012  . Cellulitis 04/24/2012  . Hemorrhoids 04/24/2012  . Syncope 04/21/2012  . Preventative health care 11/28/2010  . Depression  09/12/2010  . RENAL ARTERY STENOSIS 09/12/2010  . FATIGUE 09/12/2010  . BUNION, RIGHT FOOT 02/05/2010  . RECTAL BLEEDING 07/10/2009  . Diarrhea 07/10/2009  . DIVERTICULOSIS OF COLON 03/28/2009  . FATTY LIVER DISEASE 03/28/2009  . BRADYCARDIA 03/20/2009  . DIZZINESS 10/17/2008  . CHEST PAIN 10/17/2008  . Malignant neoplasm of bladder ([place]) 02/07/2008  . Allergic rhinitis 02/07/2008  . COLONIC POLYPS, HX OF 02/07/2008  . Hyperlipidemia 11/24/2007  . Essential hypertension 11/24/2007  . GERD 11/24/2007  . HIATAL HERNIA WITH REFLUX 11/24/2007  . RENAL INSUFFICIENCY 11/24/2007  . Rheumatoid arthritis(714.0) 11/24/2007  . [place] DISEASE, LUMBAR 11/24/2007  . ADENOIDECTOMY, HX OF 11/24/2007    Past Surgical History:  Procedure Laterality Date  . CARDIAC CATHETERIZATION  02-12-2007  dr gregg taylor   Non-obstructive CAD,  20% pLCX,  20% LAD, ef 65%  . CARDIAC CATHETERIZATION  03-14-2009  dr Johnsie Cancel   pLAD and mid diaginal 20-30% multiple lesions,  OM1 20%, right renal ostial 40-50%,  ef 60%  . CATARACT EXTRACTION W/ INTRAOCULAR LENS  IMPLANT, BILATERAL  2016  . CYSTOSCOPY WITH BIOPSY N/A 07/11/2015   Procedure: CYSTOSCOPY WITH COLD CUP RESECTION AND FULGERATION;  Surgeon: Rana Snare, MD;  Location: [facility];  Service: Urology;  Laterality: N/A;  . HIP ARTHROSCOPY W/ LABRAL DEBRIDEMENT Left 06-29-2000   and chondraplasty  . LUMBAR [place] SURGERY  07-16-2005  . LUMBAR LAMINECTOMY/DECOMPRESSION MICRODISCECTOMY  07/09/2011   Procedure: LUMBAR LAMINECTOMY/DECOMPRESSION MICRODISCECTOMY;  Surgeon: Johnn Hai;  Location: [facility];  Service: Orthopedics;  Laterality: Left;  Decompression L5 - S1 on the Left and repair of dura  . LUMBAR LAMINECTOMY/DECOMPRESSION MICRODISCECTOMY N/A 07/27/2013   Procedure: DECOMPRESSION L4 - L5 WITH EXCISION OF SYNOVIAL CYST AND, LATERAL MASS FUSION 1 LEVEL;  Surgeon: Johnn Hai, MD;  Location: [facility];  Service: Orthopedics;  Laterality:  N/A;  . LUMBAR LAMINECTOMY/DECOMPRESSION MICRODISCECTOMY Left 07/06/2014   Procedure:  MICRO LUMBAR DECOMPRESSION L5-S1 ON LEFT, L4-5 REDO;  Surgeon: Johnn Hai, MD;  Location: [facility];  Service: Orthopedics;  Laterality: Left;  . SPINAL CORD STIMULATOR INSERTION N/A 09/11/2016   Procedure: Spinal cord stimulator placement;  Surgeon: Melina Schools, MD;  Location: [facility];  Service: Orthopedics;  Laterality: N/A;  . TONSILLECTOMY AND ADENOIDECTOMY    . TRANSTHORACIC ECHOCARDIOGRAM  09-20-2010   normal LVF, ef 55-65%/  mild MR, TR and PR/  mild LAE  . TRANSURETHRAL RESECTION OF BLADDER TUMOR  09-21-2002        Home Medications    Prior to Admission medications   Medication Sig Start Date End Date Taking? Authorizing Provider  acetaminophen (TYLENOL) 650 MG CR tablet Take 1,300 mg by mouth at bedtime as needed for pain.   Yes [provider]  albuterol (PROVENTIL HFA;VENTOLIN HFA) 108 (90 Base) MCG/ACT inhaler Inhale 2 puffs into the lungs every 6 (six) hours as needed for wheezing or shortness of breath. 11/18/17  Yes John,  Hunt Oris, MD  amLODipine (NORVASC) 10 MG tablet Take 1 tablet (10 mg total) by mouth daily. 11/18/17  Yes Biagio Borg, MD  Ascorbic Acid (VITAMIN C) 1000 MG tablet Take 1,000 mg by mouth daily.   Yes [provider]  brimonidine (ALPHAGAN) 0.15 % ophthalmic solution Place 1 drop into both eyes 2 (two) times daily.   Yes [provider]  citalopram (CELEXA) 10 MG tablet TAKE 1 TABLET EVERY MORNING 05/13/17  Yes Biagio Borg, MD  clonazePAM (KLONOPIN) 0.5 MG tablet Take 1 tablet (0.5 mg total) by mouth 2 (two) times daily as needed for anxiety. 01/07/18  Yes Biagio Borg, MD  clotrimazole (LOTRIMIN) 1 % cream Apply 1 application topically 2 (two) times daily as needed (FOR RASH). Prescribed for rash in groin   Yes [provider]  fluticasone (FLONASE) 50 MCG/ACT nasal spray USE 2 SPRAYS IN EACH NOSTRIL EVERY DAY AS NEEDED  FOR   RHINITIS/ALLERGIES Patient taking differently: Place 2 sprays into both nostrils. USE 2 SPRAYS IN EACH NOSTRIL EVERY DAY AS NEEDED  FOR  RHINITIS/ALLERGIES 07/08/17  Yes Biagio Borg, MD  gabapentin (NEURONTIN) 300 MG capsule TAKE 1 TO 2 CAPSULES FOUR TIMES DAILY  AFTER MEALS AND AT BEDTIME. Patient taking differently: Take 300-600 mg by mouth See admin instructions. Four times daily and at bedtime 03/04/17  Yes Biagio Borg, MD  hydrochlorothiazide (HYDRODIURIL) 25 MG tablet Take 25 mg by mouth daily. 12/30/17  Yes [provider]  hydroxypropyl methylcellulose / hypromellose (ISOPTO TEARS / GONIOVISC) 2.5 % ophthalmic solution Place 1 drop into both eyes 2 (two) times daily.   Yes [provider]  losartan (COZAAR) 100 MG tablet Take 1 tablet (100 mg total) by mouth daily. 11/18/17  Yes Biagio Borg, MD  meclizine (ANTIVERT) 25 MG tablet TAKE 1 TABLET TWO TIMES DAILY AS NEEDED FOR DIZZINESS Patient taking differently: Take 25 mg by mouth 2 (two) times daily as needed for dizziness.  11/23/17  Yes Biagio Borg, MD  methocarbamol (ROBAXIN) 500 MG tablet Take 1 tablet (500 mg total) by mouth 3 (three) times daily as needed for muscle spasms. 09/11/16  Yes Melina Schools, MD  Nutritional Supplements (JUICE PLUS FIBRE PO) Take 1 each 2 (two) times daily by mouth.   Yes [provider]  omeprazole (PRILOSEC) 40 MG capsule Take 1 capsule by mouth twice daily. 04/10/17  Yes Zehr, Laban Emperor, PA-C  potassium chloride (K-DUR,KLOR-CON) 10 MEQ tablet TAKE 2 TABLETS EVERY DAY 10/06/17  Yes Biagio Borg, MD  primidone (MYSOLINE) 250 MG tablet TAKE 1/2 TABLET THREE TIMES DAILY Patient taking differently: Take 125 mg by mouth 3 (three) times daily.  07/28/17  Yes Biagio Borg, MD  simvastatin (ZOCOR) 40 MG tablet TAKE 1 AND 1/2 TABLETS AT BEDTIME Patient taking differently: Take 60 mg by mouth at bedtime.  07/15/17  Yes Biagio Borg, MD  tamsulosin (FLOMAX) 0.4 MG CAPS capsule Take 1 capsule  (0.4 mg total) by mouth daily. Patient taking differently: Take 0.8 mg by mouth at bedtime.  05/17/15  Yes Biagio Borg, MD  timolol (TIMOPTIC) 0.5 % ophthalmic solution Place 1 drop into both eyes 2 (two) times daily.  05/30/15  Yes [provider]  Travoprost, BAK Free, (TRAVATAN Z) 0.004 % SOLN ophthalmic solution Place 1 drop into both eyes at bedtime. 09/14/13  Yes Biagio Borg, MD  triamcinolone (KENALOG) 0.025 % cream Apply 1 application topically 2 (two) times  daily as needed (rash).    Yes [provider]  nitroGLYCERIN (NITROSTAT) 0.4 MG SL tablet PLACE 1 TABLET UNDER THE TONGUE EVERY 5 MINUTES AS NEEDED FOR CHEST PAIN. MAX 3 DOSES. CALL 911 IF NO IMPROVEMENT AFTER 1ST DOSE Patient taking differently: Place 0.4 mg under the tongue every 5 (five) minutes as needed for chest pain.  02/01/18   Biagio Borg, MD  ondansetron (ZOFRAN) 4 MG tablet Take 1 tablet (4 mg total) by mouth every 8 (eight) hours as needed for nausea or vomiting. 09/11/16   Melina Schools, MD    Family History Family History  Problem Relation Age of Onset  . Heart attack Mother   . Cancer Father        lung  . Cancer Sister        lung  . Diabetes Sister   . Cancer Brother        lung that spread to brain    Social History Social History   Tobacco Use  . Smoking status: Former Smoker    Packs/day: 2.00    Years: 20.00    Pack years: 40.00    Types: Cigarettes    Last attempt to quit: 10/08/1982    Years since quitting: 35.4  . Smokeless tobacco: Never Used  Substance Use Topics  . Alcohol use: Yes    Comment: rare wine or beer  . Drug use: No     Allergies   Morphine and related; Hydrochlorothiazide; Tape; Aspirin; Sulfur dioxide; Latex; Lisinopril; and Sulfa antibiotics   Review of Systems Review of Systems  Constitutional:       Per HPI, otherwise negative  HENT:       Per HPI, otherwise negative  Eyes: Positive for visual disturbance. Negative for photophobia.    Respiratory:       Per HPI, otherwise negative  Cardiovascular:       Per HPI, otherwise negative  Gastrointestinal: Positive for nausea and vomiting.  Endocrine:       Negative aside from HPI  Genitourinary:       Neg aside from HPI   Musculoskeletal:       Per HPI, otherwise negative  Skin: Negative.   Neurological: Positive for dizziness and weakness. Negative for syncope.     Physical Exam Updated Vital Signs BP (!) 171/78   Pulse (!) 59   Temp 98.4 F (36.9 C) (Oral)   Resp 18   Wt 81.6 kg   SpO2 98%   BMI 28.19 kg/m   Physical Exam  Constitutional: He is oriented to person, place, and time. He appears well-developed. No distress.  HENT:  Head: Normocephalic and atraumatic.  Eyes: Conjunctivae and EOM are normal.  Cardiovascular: Normal rate and regular rhythm.  Pulmonary/Chest: Effort normal. No stridor. No respiratory distress.  Abdominal: He exhibits no distension.  Musculoskeletal: He exhibits no edema.  Neurological: He is alert and oriented to person, place, and time. He displays atrophy. Coordination normal.  Skin: Skin is warm and dry.  Psychiatric: He has a normal mood and affect.  Nursing note and vitals reviewed.    ED Treatments / Results  Labs (all labs ordered are listed, but only abnormal results are displayed) Labs Reviewed  COMPREHENSIVE METABOLIC PANEL - Abnormal; Notable for the following components:      Result Value   Glucose, Bld 103 (*)    Calcium 8.8 (*)    All other components within normal limits  RAPID URINE DRUG SCREEN, HOSP PERFORMED -  Abnormal; Notable for the following components:   Opiates   (*)    Value: Result not available. Reagent lot number recalled by manufacturer.   Barbiturates POSITIVE (*)    All other components within normal limits  URINALYSIS, ROUTINE W REFLEX MICROSCOPIC - Abnormal; Notable for the following components:   APPearance HAZY (*)    Hgb urine dipstick SMALL (*)    Leukocytes, UA LARGE (*)     WBC, UA >50 (*)    Bacteria, UA MANY (*)    All other components within normal limits  URINE CULTURE  PROTIME-INR  APTT  CBC  DIFFERENTIAL  I-STAT TROPONIN, ED    EKG None  Radiology Ct Head Wo Contrast  Result Date: 02/23/2018 CLINICAL DATA:  Headache with hypertension and dizziness EXAM: CT HEAD WITHOUT CONTRAST TECHNIQUE: Contiguous axial images were obtained from the base of the skull through the vertex without intravenous contrast. COMPARISON:  CT 06/01/2017 FINDINGS: Brain: No acute territorial infarction, hemorrhage or intracranial mass. Mild atrophy and minimal small vessel ischemic changes of the white matter. Stable ventricle size Vascular: No hyperdense vessels.  Carotid vascular calcification Skull: Normal. Negative for fracture or focal lesion. Sinuses/Orbits: No acute finding. Other: None IMPRESSION: 1. No CT evidence for acute intracranial abnormality. 2. Atrophy and minimal small vessel ischemic changes of the white matter Electronically Signed   By: Donavan Foil M.D.   On: 02/23/2018 17:17    Procedures Procedures (including critical care time)  Medications Ordered in ED Medications  sodium chloride 0.9 % bolus 500 mL (0 mLs Intravenous Stopped 02/23/18 1750)    Followed by  0.9 %  sodium chloride infusion (100 mL/hr Intravenous New Bag/Given 02/23/18 1635)  ciprofloxacin (CIPRO) tablet 500 mg (has no administration in time range)  hydrALAZINE (APRESOLINE) injection 5 mg (5 mg Intravenous Given 02/23/18 1625)  acetaminophen (TYLENOL) tablet 1,000 mg (1,000 mg Oral Given 02/23/18 1758)  hydrALAZINE (APRESOLINE) injection 5 mg (5 mg Intravenous Given 02/23/18 1759)     Initial Impression / Assessment and Plan / ED Course  I have reviewed the triage vital signs and the nursing notes.  Pertinent labs & imaging results that were available during my care of the patient were reviewed by me and considered in my medical decision making (see chart for details).      Update: Patient's blood pressure has diminished, thin increased again, though after 2 doses of hydralazine has remained diminished.   8:22 PM Patient in no distress, feels better, blood pressure is improved, he has no ongoing pain, no ongoing visual complaints either. We discussed all findings at length, with multiple family members present. We discussed the importance of following up with primary care and urology given the equivocal urinalysis results, though without evidence of bacteremia or sepsis, with no abdominal pain, fever, leukocytosis.  With his improvement, some suspicion for hypertensive urgency, low suspicion for occult stroke. Patient was not a candidate for MRI given his implanted back nerve stimulator.   Final Clinical Impressions(s) / ED Diagnoses  Dizziness Vision changes Hypertensive urgency   Carmin Muskrat, MD 02/23/18 2025

## 2018-02-25 ENCOUNTER — Ambulatory Visit: Payer: Medicare Other | Admitting: Internal Medicine

## 2018-02-25 ENCOUNTER — Encounter: Payer: Self-pay | Admitting: Internal Medicine

## 2018-02-25 ENCOUNTER — Ambulatory Visit (INDEPENDENT_AMBULATORY_CARE_PROVIDER_SITE_OTHER): Payer: Medicare Other | Admitting: Internal Medicine

## 2018-02-25 VITALS — BP 144/68 | HR 72 | Ht 67.0 in | Wt 193.0 lb

## 2018-02-25 VITALS — BP 118/78 | HR 55 | Temp 97.6°F | Ht 67.0 in | Wt 189.0 lb

## 2018-02-25 DIAGNOSIS — I10 Essential (primary) hypertension: Secondary | ICD-10-CM | POA: Diagnosis not present

## 2018-02-25 DIAGNOSIS — R197 Diarrhea, unspecified: Secondary | ICD-10-CM | POA: Diagnosis not present

## 2018-02-25 DIAGNOSIS — R8290 Unspecified abnormal findings in urine: Secondary | ICD-10-CM | POA: Insufficient documentation

## 2018-02-25 DIAGNOSIS — I509 Heart failure, unspecified: Secondary | ICD-10-CM | POA: Diagnosis not present

## 2018-02-25 DIAGNOSIS — R7302 Impaired glucose tolerance (oral): Secondary | ICD-10-CM

## 2018-02-25 DIAGNOSIS — K219 Gastro-esophageal reflux disease without esophagitis: Secondary | ICD-10-CM | POA: Diagnosis not present

## 2018-02-25 MED ORDER — HYDROCHLOROTHIAZIDE 25 MG PO TABS: 12.5000 mg | ORAL_TABLET | Freq: Every day | ORAL | 3 refills | Status: DC

## 2018-02-25 MED ORDER — PSYLLIUM 58.6 % PO POWD: ORAL | 12 refills | Status: DC

## 2018-02-25 NOTE — Assessment & Plan Note (Signed)
stable overall by history and exam, recent data reviewed with pt, and pt to continue medical treatment as before,  to f/u any worsening symptoms or concerns Lab Results  Component Value Date   HGBA1C 5.4 10/14/2017

## 2018-02-25 NOTE — Progress Notes (Signed)
HISTORY OF PRESENT ILLNESS:  [name] is a 82 y.o. male with multiple medical problems as listed below who presents himself today with a chief complaint of chronic diarrhea and routine checkup regarding his GERD. Previous patient of Dr. Sharlett Iles and Dr. Velora Heckler. Initially seen by myself May 2017 regarding noncardiac chest pain. Upper endoscopy at that time was unremarkable. He did report chronic alternating bowel habits for which fiber supplementation is recommended. For his chronic GERD he has required twice daily PPI to control symptoms. He tells me that he is compliant with this therapy. He denies dysphagia. No obvious medication side effects. In terms of his bowels he tells me that he has had problems with diarrhea for years. Actually what he describes his loose bowel movements 3 days per week. On the other days he calls a "" constipation". Actually he just has no bowel movement. He denies using antidiarrheals or laxatives. His memory seems a bit question.it is not Clear that he is using her has used fiber. He does not have incontinence. His gallbladder is intact. Review of outside x-rays from October 2018 shows abdominal ultrasound of the right upper quadrant with normal gallbladder. Normal liver. Previous colonoscopies and flexible sigmoidoscopies with Dr. Sharlett Iles. I have also reviewed outside blood work from the [facility] dated 01/02/2018. Unremarkable CBC with hemoglobin 14.9. Mild elevation of hepatic transaminases. Otherwise normal liver profile. Normal albumin. Normal electrolytes. He has been on ciprofloxacin for bladder infection but tells me that this has not affected his bowel habits.  REVIEW OF SYSTEMS:  All non-GI ROS negative unless otherwise stated in the history of present illnessexcept for microscopic hematuria, arthritis, balance issues  Past Medical History:  Diagnosis Date  . Acute bronchitis per pt no fever--  cough since discharge from hospital 07-02-2015   per pcp note  07-05-2015  mild to moderate bronchitis vs pna  . Allergic rhinitis   . Bladder cancer ([place])   . BPH (benign prostatic hyperplasia)   . Bradycardia   . CAD (coronary artery disease)   . Chronic low back pain   . DDD (degenerative disc disease), lumbosacral   . Depression   . Diverticulosis of colon (without mention of hemorrhage)   . Dyspnea   . Essential tremor   . Fatty liver disease, nonalcoholic   . GERD (gastroesophageal reflux disease)   . Glaucoma   . History of bladder cancer    2004--  TCC low-grade , non-invasive  . History of colon polyps   . History of exercise stress test    02-05-2007-- ETT (Clinically positive, electrically equivocal, submaximal ETT,  appropriate bp response to exercise  . History of hiatal hernia   . History of kidney stones   . History of peptic ulcer   . HLD (hyperlipidemia)   . HTN (hypertension)   . Nocturia   . Productive cough   . Rheumatoid arthritis(714.0)   . Right renal artery stenosis (HCC)    per cath 03-14-2009 --- 40-50%  . RLS (restless legs syndrome)   . Vertigo, intermittent   . Weak urinary stream   . Wears dentures    upper  . Wears glasses   . Wears hearing aid    bilateral  . Wears partial dentures    lower    Past Surgical History:  Procedure Laterality Date  . CARDIAC CATHETERIZATION  02-12-2007  dr gregg taylor   Non-obstructive CAD,  20% pLCX,  20% LAD, ef 65%  . CARDIAC CATHETERIZATION  03-14-2009  dr Johnsie Cancel   pLAD and mid diaginal 20-30% multiple lesions,  OM1 20%, right renal ostial 40-50%,  ef 60%  . CATARACT EXTRACTION W/ INTRAOCULAR LENS  IMPLANT, BILATERAL  2016  . CYSTOSCOPY WITH BIOPSY N/A 07/11/2015   Procedure: CYSTOSCOPY WITH COLD CUP RESECTION AND FULGERATION;  Surgeon: Rana Snare, MD;  Location: [facility];  Service: Urology;  Laterality: N/A;  . HIP ARTHROSCOPY W/ LABRAL DEBRIDEMENT Left 06-29-2000   and chondraplasty  . LUMBAR [place] SURGERY  07-16-2005  . LUMBAR  LAMINECTOMY/DECOMPRESSION MICRODISCECTOMY  07/09/2011   Procedure: LUMBAR LAMINECTOMY/DECOMPRESSION MICRODISCECTOMY;  Surgeon: Johnn Hai;  Location[facility];  Service: Orthopedics;  Laterality: Left;  Decompression L5 - S1 on the Left and repair of dura  . LUMBAR LAMINECTOMY/DECOMPRESSION MICRODISCECTOMY N/A 07/27/2013   Procedure: DECOMPRESSION L4 - L5 WITH EXCISION OF SYNOVIAL CYST AND, LATERAL MASS FUSION 1 LEVEL;  Surgeon: Johnn Hai, MD;  Location[facility];  Service: Orthopedics;  Laterality: N/A;  . LUMBAR LAMINECTOMY/DECOMPRESSION MICRODISCECTOMY Left 07/06/2014   Procedure:  MICRO LUMBAR DECOMPRESSION L5-S1 ON LEFT, L4-5 REDO;  Surgeon: Johnn Hai, MD;  [facility];  Service: Orthopedics;  Laterality: Left;  . SPINAL CORD STIMULATOR INSERTION N/A 09/11/2016   Procedure: Spinal cord stimulator placement;  Surgeon: Melina Schools, MD;  Location: [facility];  Service: Orthopedics;  Laterality: N/A;  . TONSILLECTOMY AND ADENOIDECTOMY    . TRANSTHORACIC ECHOCARDIOGRAM  09-20-2010   normal LVF, ef 55-65%/  mild MR, TR and PR/  mild LAE  . TRANSURETHRAL RESECTION OF BLADDER TUMOR  09-21-2002    Social History WILLIAMS DIETRICK  reports that he quit smoking about 35 years ago. His smoking use included cigarettes. He has a 40.00 pack-year smoking history. He has never used smokeless tobacco. He reports that he drinks alcohol. He reports that he does not use drugs.  family history includes Cancer in his brother, father, and sister; Diabetes in his sister; Heart attack in his mother.  Allergies  Allergen Reactions  . Morphine And Related Other (See Comments)    Migraine   . Hydrochlorothiazide Other (See Comments)    REACTION: hyponatremia with daily use  . Tape Itching    Patient  Is ok to use paper tape  . Aspirin   . Sulfur Dioxide   . Latex Rash  . Lisinopril Cough  . Sulfa Antibiotics Rash       PHYSICAL EXAMINATION: Vital signs: BP (!) 144/68   Pulse 72   Ht 5\' 7"   (1.702 m)   Wt 193 lb (87.5 kg)   BMI 30.23 kg/m   Constitutional: pleasant, elderly,generally well-appearing, no acute distress Psychiatric: alert and oriented x3, cooperative. Appears to have some memory deficits Eyes: extraocular movements intact, anicteric, conjunctiva pink Mouth: oral pharynx moist, no lesions Neck: supple no lymphadenopathy Cardiovascular: heart regular rate and rhythm, no murmur Lungs: clear to auscultation bilaterally Abdomen: soft, nontender, nondistended, no obvious ascites, no peritoneal signs, normal bowel sounds, no organomegaly Rectal:omitted Extremities: no clubbing, cyanosis, or lower extremity edema bilaterally Skin: no lesions on visible extremities Neuro: No focal deficits. Cranial nerves intact  ASSESSMENT:  #1. Intermittent problems with loose stools alternating with no bowel movement as described. No worrisome features. Chronic. No incontinence. No weight loss. No bleeding. Aged out of colon cancer surveillance #2. Chronic GERD. Requires twice a day PPI to control symptoms. Doing well from this standpoint #3. Multiple medical problems   PLAN:  #1. Recommend Metamucil 2 tablespoons daily and  12 ounces of water #2. Continue PPI #3. Reflux precautions #4. GI follow-up as needed  25 minutes spent face-to-face with the patient. Greater than 50% a time use for counseling regarding his bowel habit issues and ongoing treatment of GERD

## 2018-02-25 NOTE — Patient Instructions (Addendum)
OK to restart the Hydochlorothiazide at 12.5 mg per day  Please continue all other medications as before, and refills have been done if requested.  Please have the pharmacy call with any other refills you may need.  Please continue your efforts at being more active, low cholesterol diet, and weight control.  Please keep your appointments with your specialists as you may have planned

## 2018-02-25 NOTE — Progress Notes (Signed)
Subjective:    Patient ID: [name], male    DOB: 1935/08/25, 82 y.o.   MRN: [id number]  HPI  Pt seen with dizziness and elevated BP (after family confict at home just prior) at ED aug 13, improved with tx, restarted on high dose hct 50 mg but he has not started.  Bp was elevated 161's in both arms. But now lower after the 100 mg losartan.   Also started on antibiotic for ? Early UTI, has f/u appt next wk with urology.  Vicente Males urinary symptoms such as dysuria, frequency, urgency, flank pain, hematuria or n/v, fever, chills.  Pt denies chest pain, increased sob or doe, wheezing, orthopnea, PND, increased LE swelling, palpitations, or syncope.  Pt denies polydipsia, polyuria.   Past Medical History:  Diagnosis Date  . Acute bronchitis per pt no fever--  cough since discharge from hospital 07-02-2015   per pcp note 07-05-2015  mild to moderate bronchitis vs pna  . Allergic rhinitis   . Bladder cancer ([place])   . BPH (benign prostatic hyperplasia)   . Bradycardia   . CAD (coronary artery disease)   . Chronic low back pain   . DDD (degenerative disc disease), lumbosacral   . Depression   . Diverticulosis of colon (without mention of hemorrhage)   . Dyspnea   . Essential tremor   . Fatty liver disease, nonalcoholic   . GERD (gastroesophageal reflux disease)   . Glaucoma   . History of bladder cancer    2004--  TCC low-grade , non-invasive  . History of colon polyps   . History of exercise stress test    02-05-2007-- ETT (Clinically positive, electrically equivocal, submaximal ETT,  appropriate bp response to exercise  . History of hiatal hernia   . History of kidney stones   . History of peptic ulcer   . HLD (hyperlipidemia)   . HTN (hypertension)   . Nocturia   . Productive cough   . Rheumatoid arthritis(714.0)   . Right renal artery stenosis (HCC)    per cath 03-14-2009 --- 40-50%  . RLS (restless legs syndrome)   . Vertigo, intermittent   . Weak urinary stream   . Wears  dentures    upper  . Wears glasses   . Wears hearing aid    bilateral  . Wears partial dentures    lower   Past Surgical History:  Procedure Laterality Date  . CARDIAC CATHETERIZATION  02-12-2007  dr gregg taylor   Non-obstructive CAD,  20% pLCX,  20% LAD, ef 65%  . CARDIAC CATHETERIZATION  03-14-2009  dr Johnsie Cancel   pLAD and mid diaginal 20-30% multiple lesions,  OM1 20%, right renal ostial 40-50%,  ef 60%  . CATARACT EXTRACTION W/ INTRAOCULAR LENS  IMPLANT, BILATERAL  2016  . CYSTOSCOPY WITH BIOPSY N/A 07/11/2015   Procedure: CYSTOSCOPY WITH COLD CUP RESECTION AND FULGERATION;  Surgeon: Rana Snare, MD;  Location: [facility];  Service: Urology;  Laterality: N/A;  . HIP ARTHROSCOPY W/ LABRAL DEBRIDEMENT Left 06-29-2000   and chondraplasty  . LUMBAR [place] SURGERY  07-16-2005  . LUMBAR LAMINECTOMY/DECOMPRESSION MICRODISCECTOMY  07/09/2011   Procedure: LUMBAR LAMINECTOMY/DECOMPRESSION MICRODISCECTOMY;  Surgeon: Johnn Hai;  Location: [facility];  Service: Orthopedics;  Laterality: Left;  Decompression L5 - S1 on the Left and repair of dura  . LUMBAR LAMINECTOMY/DECOMPRESSION MICRODISCECTOMY N/A 07/27/2013   Procedure: DECOMPRESSION L4 - L5 WITH EXCISION OF SYNOVIAL CYST AND, LATERAL MASS FUSION 1 LEVEL;  Surgeon: Dellis Filbert  Windy Kalata, MD;  Location: [facility];  Service: Orthopedics;  Laterality: N/A;  . LUMBAR LAMINECTOMY/DECOMPRESSION MICRODISCECTOMY Left 07/06/2014   Procedure:  MICRO LUMBAR DECOMPRESSION L5-S1 ON LEFT, L4-5 REDO;  Surgeon: Johnn Hai, MD;  Location: [facility];  Service: Orthopedics;  Laterality: Left;  . SPINAL CORD STIMULATOR INSERTION N/A 09/11/2016   Procedure: Spinal cord stimulator placement;  Surgeon: Melina Schools, MD;  Location: [facility] [place];  Service: Orthopedics;  Laterality: N/A;  . TONSILLECTOMY AND ADENOIDECTOMY    . TRANSTHORACIC ECHOCARDIOGRAM  09-20-2010   normal LVF, ef 55-65%/  mild MR, TR and PR/  mild LAE  . TRANSURETHRAL RESECTION OF BLADDER TUMOR   09-21-2002    reports that he quit smoking about 35 years ago. His smoking use included cigarettes. He has a 40.00 pack-year smoking history. He has never used smokeless tobacco. He reports that he drinks alcohol. He reports that he does not use drugs. family history includes Cancer in his brother, father, and sister; Diabetes in his sister; Heart attack in his mother. Allergies  Allergen Reactions  . Morphine And Related Other (See Comments)    Migraine   . Hydrochlorothiazide Other (See Comments)    REACTION: hyponatremia with daily use  . Tape Itching    Patient  Is ok to use paper tape  . Aspirin   . Sulfur Dioxide   . Latex Rash  . Lisinopril Cough  . Sulfa Antibiotics Rash   Current Outpatient Medications on File Prior to Visit  Medication Sig Dispense Refill  . acetaminophen (TYLENOL) 650 MG CR tablet Take 1,300 mg by mouth at bedtime as needed for pain.    Marland Kitchen albuterol (PROVENTIL HFA;VENTOLIN HFA) 108 (90 Base) MCG/ACT inhaler Inhale 2 puffs into the lungs every 6 (six) hours as needed for wheezing or shortness of breath. 54 g 3  . amLODipine (NORVASC) 10 MG tablet Take 1 tablet (10 mg total) by mouth daily. 90 tablet 3  . Ascorbic Acid (VITAMIN C) 1000 MG tablet Take 1,000 mg by mouth daily.    . brimonidine (ALPHAGAN) 0.15 % ophthalmic solution Place 1 drop into both eyes 2 (two) times daily.    . ciprofloxacin (CIPRO) 500 MG tablet Take 1 tablet (500 mg total) by mouth 2 (two) times daily. 14 tablet 0  . citalopram (CELEXA) 10 MG tablet TAKE 1 TABLET EVERY MORNING 90 tablet 3  . clonazePAM (KLONOPIN) 0.5 MG tablet Take 1 tablet (0.5 mg total) by mouth 2 (two) times daily as needed for anxiety. 60 tablet 0  . clotrimazole (LOTRIMIN) 1 % cream Apply 1 application topically 2 (two) times daily as needed (FOR RASH). Prescribed for rash in groin    . fluticasone (FLONASE) 50 MCG/ACT nasal spray USE 2 SPRAYS IN EACH NOSTRIL EVERY DAY AS NEEDED  FOR  RHINITIS/ALLERGIES (Patient taking  differently: Place 2 sprays into both nostrils. USE 2 SPRAYS IN EACH NOSTRIL EVERY DAY AS NEEDED  FOR  RHINITIS/ALLERGIES) 48 g 3  . gabapentin (NEURONTIN) 300 MG capsule TAKE 1 TO 2 CAPSULES FOUR TIMES DAILY  AFTER MEALS AND AT BEDTIME. (Patient taking differently: Take 300-600 mg by mouth See admin instructions. Four times daily and at bedtime) 720 capsule 3  . hydroxypropyl methylcellulose / hypromellose (ISOPTO TEARS / GONIOVISC) 2.5 % ophthalmic solution Place 1 drop into both eyes 2 (two) times daily.    Marland Kitchen losartan (COZAAR) 100 MG tablet Take 1 tablet (100 mg total) by mouth daily. 90 tablet 3  . meclizine (ANTIVERT) 25  MG tablet TAKE 1 TABLET TWO TIMES DAILY AS NEEDED FOR DIZZINESS (Patient taking differently: Take 25 mg by mouth 2 (two) times daily as needed for dizziness. ) 180 tablet 1  . methocarbamol (ROBAXIN) 500 MG tablet Take 1 tablet (500 mg total) by mouth 3 (three) times daily as needed for muscle spasms. 21 tablet 0  . nitroGLYCERIN (NITROSTAT) 0.4 MG SL tablet PLACE 1 TABLET UNDER THE TONGUE EVERY 5 MINUTES AS NEEDED FOR CHEST PAIN. MAX 3 DOSES. CALL 911 IF NO IMPROVEMENT AFTER 1ST DOSE (Patient taking differently: Place 0.4 mg under the tongue every 5 (five) minutes as needed for chest pain. ) 25 tablet 0  . Nutritional Supplements (JUICE PLUS FIBRE PO) Take 1 each 2 (two) times daily by mouth.    Marland Kitchen omeprazole (PRILOSEC) 40 MG capsule Take 1 capsule by mouth twice daily. 180 capsule 3  . ondansetron (ZOFRAN) 4 MG tablet Take 1 tablet (4 mg total) by mouth every 8 (eight) hours as needed for nausea or vomiting. 20 tablet 0  . potassium chloride (K-DUR,KLOR-CON) 10 MEQ tablet TAKE 2 TABLETS EVERY DAY 180 tablet 1  . primidone (MYSOLINE) 250 MG tablet TAKE 1/2 TABLET THREE TIMES DAILY (Patient taking differently: Take 125 mg by mouth 3 (three) times daily. ) 135 tablet 2  . psyllium (METAMUCIL SMOOTH TEXTURE) 58.6 % powder Take 2 tablespoons daily. Drink 12 oz of water 283 g 12  .  simvastatin (ZOCOR) 40 MG tablet TAKE 1 AND 1/2 TABLETS AT BEDTIME (Patient taking differently: Take 60 mg by mouth at bedtime. ) 135 tablet 2  . tamsulosin (FLOMAX) 0.4 MG CAPS capsule Take 1 capsule (0.4 mg total) by mouth daily. (Patient taking differently: Take 0.8 mg by mouth at bedtime. ) 90 capsule 3  . timolol (TIMOPTIC) 0.5 % ophthalmic solution Place 1 drop into both eyes 2 (two) times daily.   1  . Travoprost, BAK Free, (TRAVATAN Z) 0.004 % SOLN ophthalmic solution Place 1 drop into both eyes at bedtime. 5 mL 5  . triamcinolone (KENALOG) 0.025 % cream Apply 1 application topically 2 (two) times daily as needed (rash).      No current facility-administered medications on file prior to visit.    Review of Systems  Constitutional: Negative for other unusual diaphoresis or sweats HENT: Negative for ear discharge or swelling Eyes: Negative for other worsening visual disturbances Respiratory: Negative for stridor or other swelling  Gastrointestinal: Negative for worsening distension or other blood Genitourinary: Negative for retention or other urinary change Musculoskeletal: Negative for other MSK pain or swelling Skin: Negative for color change or other new lesions Neurological: Negative for worsening tremors and other numbness  Psychiatric/Behavioral: Negative for worsening agitation or other fatigue All other system neg per pt    Objective:   Physical Exam BP 118/78   Pulse (!) 55   Temp 97.6 F (36.4 C) (Oral)   Ht 5\' 7"  (1.702 m)   Wt 189 lb (85.7 kg)   SpO2 96%   BMI 29.60 kg/m  VS noted,  Constitutional: Pt appears in NAD HENT: Head: NCAT.  Right Ear: External ear normal.  Left Ear: External ear normal.  Eyes: . Pupils are equal, round, and reactive to light. Conjunctivae and EOM are normal Nose: without d/c or deformity Neck: Neck supple. Gross normal ROM Cardiovascular: Normal rate and regular rhythm.   Pulmonary/Chest: Effort normal and breath sounds without  rales or wheezing.  Abd:  Soft, NT, ND, + BS, no organomegaly Neurological:  Pt is alert. At baseline orientation, motor grossly intact Skin: Skin is warm. No rashes, other new lesions, no LE edema Psychiatric: Pt behavior is normal without agitation , mild nervous Lab Results  Component Value Date   WBC 6.4 02/23/2018   HGB 14.6 02/23/2018   HCT 42.6 02/23/2018   PLT 212 02/23/2018   GLUCOSE 103 (H) 02/23/2018   CHOL 146 10/14/2017   TRIG 167.0 (H) 10/14/2017   HDL 47.60 10/14/2017   LDLDIRECT 93.6 10/28/2012   LDLCALC 65 10/14/2017   ALT 39 02/23/2018   AST 35 02/23/2018   NA 140 02/23/2018   K 4.5 02/23/2018   CL 105 02/23/2018   CREATININE 0.79 02/23/2018   BUN 16 02/23/2018   CO2 25 02/23/2018   TSH 3.04 10/14/2017   PSA 0.53 11/12/2016   INR 1.15 02/23/2018   HGBA1C 5.4 10/14/2017       Assessment & Plan:

## 2018-02-25 NOTE — Patient Instructions (Signed)
If you are age 80 or older, your body mass index should be between 23-30. Your Body mass index is 30.23 kg/m. If this is out of the aforementioned range listed, please consider follow up with your Primary Care Provider.  If you are age 89 or younger, your body mass index should be between 19-25. Your Body mass index is 30.23 kg/m. If this is out of the aformentioned range listed, please consider follow up with your Primary Care Provider.   Take 2 tablespoons of Metamucil daily and drink 12 ounces of water.  Follow up with [place] as needed.  Thank you for entrusting me with your care and for choosing [facility], Dr. Henrene Pastor

## 2018-02-25 NOTE — Assessment & Plan Note (Signed)
To cont oral cipro fo rnow, f/u urology as planned

## 2018-02-25 NOTE — Assessment & Plan Note (Signed)
Volume stable, stable overall by history and exam, recent data reviewed with pt, and pt to continue medical treatment as before,  to f/u any worsening symptoms or concerns 

## 2018-02-25 NOTE — Assessment & Plan Note (Signed)
Ok to restart hct at 12.5 mg daily, con tto monitor at home

## 2018-02-26 LAB — URINE CULTURE: Culture: >=100000 — AB

## 2018-02-27 ENCOUNTER — Telehealth: Payer: Self-pay

## 2018-02-27 NOTE — Telephone Encounter (Signed)
Post ED Visit - Positive Culture Follow-up  Culture report reviewed by antimicrobial stewardship pharmacist:  []  Elenor Quinones, Pharm.D. []  Heide Guile, Pharm.D., BCPS AQ-ID []  Parks Neptune, Pharm.D., BCPS []  Alycia Rossetti, Pharm.D., BCPS []  [place], [place].D., BCPS, AAHIVP []  Legrand Como, Pharm.D., BCPS, AAHIVP []  Salome Arnt, PharmD, BCPS []  Johnnette Gourd, PharmD, BCPS []  Hughes Better, PharmD, BCPS []  Leeroy Cha, PharmD H Randon Goldsmith Pharm D Positive urine culture Treated with Ciprofloxacin, organism sensitive to the same and no further patient follow-up is required at this time.  Genia Del 02/27/2018, 11:15 AM

## 2018-03-02 DIAGNOSIS — N3001 Acute cystitis with hematuria: Secondary | ICD-10-CM | POA: Diagnosis not present

## 2018-03-02 DIAGNOSIS — C672 Malignant neoplasm of lateral wall of bladder: Secondary | ICD-10-CM | POA: Diagnosis not present

## 2018-03-03 ENCOUNTER — Encounter: Payer: Self-pay | Admitting: Podiatry

## 2018-03-03 ENCOUNTER — Ambulatory Visit (INDEPENDENT_AMBULATORY_CARE_PROVIDER_SITE_OTHER): Payer: Medicare Other | Admitting: Podiatry

## 2018-03-03 DIAGNOSIS — M79676 Pain in unspecified toe(s): Secondary | ICD-10-CM

## 2018-03-03 DIAGNOSIS — B351 Tinea unguium: Secondary | ICD-10-CM | POA: Diagnosis not present

## 2018-03-03 NOTE — Progress Notes (Addendum)
Complaint:  Visit Type: Patient returns to my office for continued preventative foot care services. Complaint: Patient states" my nails have grown long and thick and become painful to walk and wear shoes" . The patient presents for preventative foot care services. No changes to ROS  Podiatric Exam: Vascular: dorsalis pedis and posterior tibial pulses are palpable bilateral. Capillary return is immediate. Temperature gradient is WNL. Skin turgor WNL  Sensorium: Normal Semmes Weinstein monofilament test. Normal tactile sensation bilaterally. Nail Exam: Pt has thick disfigured discolored nails with subungual debris noted bilateral entire nail hallux through fifth toenails Ulcer Exam: There is no evidence of ulcer or pre-ulcerative changes or infection. Orthopedic Exam: Muscle tone and strength are WNL. No limitations in general ROM. No crepitus or effusions noted. Foot type and digits show no abnormalities. Exostosis 1st MCJ  B/L.   Skin: No Porokeratosis. No infection or ulcers  Diagnosis:  Onychomycosis, , Pain in right toe, pain in left toes  Treatment & Plan Procedures and Treatment: Consent by patient was obtained for treatment procedures.   Debridement of mycotic and hypertrophic toenails, 1 through 5 bilateral and clearing of subungual debris. No ulceration, no infection noted. Post surgery 1st MPJ  Right foot. ABN signed for 2019. Return Visit-Office Procedure: Patient instructed to return to the office for a follow up visit 3 months for continued evaluation and treatment.    Gardiner Barefoot DPM

## 2018-03-10 ENCOUNTER — Other Ambulatory Visit: Payer: Self-pay | Admitting: Internal Medicine

## 2018-03-11 ENCOUNTER — Other Ambulatory Visit: Payer: Self-pay | Admitting: Internal Medicine

## 2018-03-26 DIAGNOSIS — H401133 Primary open-angle glaucoma, bilateral, severe stage: Secondary | ICD-10-CM | POA: Diagnosis not present

## 2018-03-26 DIAGNOSIS — H43812 Vitreous degeneration, left eye: Secondary | ICD-10-CM | POA: Diagnosis not present

## 2018-03-26 DIAGNOSIS — H401233 Low-tension glaucoma, bilateral, severe stage: Secondary | ICD-10-CM | POA: Diagnosis not present

## 2018-03-26 DIAGNOSIS — H5340 Unspecified visual field defects: Secondary | ICD-10-CM | POA: Diagnosis not present

## 2018-04-02 ENCOUNTER — Other Ambulatory Visit: Payer: Self-pay | Admitting: Internal Medicine

## 2018-04-06 ENCOUNTER — Other Ambulatory Visit: Payer: Self-pay | Admitting: Internal Medicine

## 2018-04-16 ENCOUNTER — Ambulatory Visit: Payer: Medicare Other | Admitting: Internal Medicine

## 2018-04-19 ENCOUNTER — Other Ambulatory Visit: Payer: Self-pay | Admitting: Gastroenterology

## 2018-04-20 ENCOUNTER — Encounter: Payer: Self-pay | Admitting: Internal Medicine

## 2018-04-20 ENCOUNTER — Ambulatory Visit (INDEPENDENT_AMBULATORY_CARE_PROVIDER_SITE_OTHER): Payer: Medicare Other | Admitting: Internal Medicine

## 2018-04-20 VITALS — BP 152/84 | HR 70 | Temp 98.0°F | Ht 67.0 in | Wt 197.0 lb

## 2018-04-20 DIAGNOSIS — Z23 Encounter for immunization: Secondary | ICD-10-CM | POA: Diagnosis not present

## 2018-04-20 DIAGNOSIS — I10 Essential (primary) hypertension: Secondary | ICD-10-CM | POA: Diagnosis not present

## 2018-04-20 DIAGNOSIS — E785 Hyperlipidemia, unspecified: Secondary | ICD-10-CM

## 2018-04-20 DIAGNOSIS — R7302 Impaired glucose tolerance (oral): Secondary | ICD-10-CM

## 2018-04-20 MED ORDER — HYDRALAZINE HCL 10 MG PO TABS: 10.0000 mg | ORAL_TABLET | Freq: Three times a day (TID) | ORAL | 3 refills | Status: DC

## 2018-04-20 NOTE — Assessment & Plan Note (Signed)
stable overall by history and exam, recent data reviewed with pt, and pt to continue medical treatment as before,  to f/u any worsening symptoms or concerns  

## 2018-04-20 NOTE — Progress Notes (Signed)
Subjective:    Patient ID: [name], male    DOB: 09/21/1935, 82 y.o.   MRN: [id number]  HPI  Here to f/u; overall doing ok,  Pt denies chest pain, increasing sob or doe, wheezing, orthopnea, PND, increased LE swelling, palpitations, dizziness or syncope.  Pt denies new neurological symptoms such as new headache, or facial or extremity weakness or numbness.  Pt denies polydipsia, polyuria, or low sugar episode.  Pt states overall good compliance with meds, mostly trying to follow appropriate diet, with wt overall stable,  BP has been elevated recently.  Recalls he responded nicely to IV hyralazine at last ED visit, hoping for similar oral.   Pt denies fever, wt loss, night sweats, loss of appetite, or other constitutional symptoms  No worsening joint pain or swelling today Past Medical History:  Diagnosis Date  . Acute bronchitis per pt no fever--  cough since discharge from hospital 07-02-2015   per pcp note 07-05-2015  mild to moderate bronchitis vs pna  . Allergic rhinitis   . Bladder cancer ([place])   . BPH (benign prostatic hyperplasia)   . Bradycardia   . CAD (coronary artery disease)   . Chronic low back pain   . DDD (degenerative disc disease), lumbosacral   . Depression   . Diverticulosis of colon (without mention of hemorrhage)   . Dyspnea   . Essential tremor   . Fatty liver disease, nonalcoholic   . GERD (gastroesophageal reflux disease)   . Glaucoma   . History of bladder cancer    2004--  TCC low-grade , non-invasive  . History of colon polyps   . History of exercise stress test    02-05-2007-- ETT (Clinically positive, electrically equivocal, submaximal ETT,  appropriate bp response to exercise  . History of hiatal hernia   . History of kidney stones   . History of peptic ulcer   . HLD (hyperlipidemia)   . HTN (hypertension)   . Nocturia   . Productive cough   . Rheumatoid arthritis(714.0)   . Right renal artery stenosis ([facility])    per cath 03-14-2009 --- 40-50%    . RLS (restless legs syndrome)   . Vertigo, intermittent   . Weak urinary stream   . Wears dentures    upper  . Wears glasses   . Wears hearing aid    bilateral  . Wears partial dentures    lower   Past Surgical History:  Procedure Laterality Date  . CARDIAC CATHETERIZATION  02-12-2007  dr gregg taylor   Non-obstructive CAD,  20% pLCX,  20% LAD, ef 65%  . CARDIAC CATHETERIZATION  03-14-2009  dr Johnsie Cancel   pLAD and mid diaginal 20-30% multiple lesions,  OM1 20%, right renal ostial 40-50%,  ef 60%  . CATARACT EXTRACTION W/ INTRAOCULAR LENS  IMPLANT, BILATERAL  2016  . CYSTOSCOPY WITH BIOPSY N/A 07/11/2015   Procedure: CYSTOSCOPY WITH COLD CUP RESECTION AND FULGERATION;  Surgeon: Rana Snare, MD;  Location: [facility];  Service: Urology;  Laterality: N/A;  . HIP ARTHROSCOPY W/ LABRAL DEBRIDEMENT Left 06-29-2000   and chondraplasty  . LUMBAR [place] SURGERY  07-16-2005  . LUMBAR LAMINECTOMY/DECOMPRESSION MICRODISCECTOMY  07/09/2011   Procedure: LUMBAR LAMINECTOMY/DECOMPRESSION MICRODISCECTOMY;  Surgeon: Johnn Hai;  Location: [facility];  Service: Orthopedics;  Laterality: Left;  Decompression L5 - S1 on the Left and repair of dura  . LUMBAR LAMINECTOMY/DECOMPRESSION MICRODISCECTOMY N/A 07/27/2013   Procedure: DECOMPRESSION L4 - L5 WITH EXCISION OF SYNOVIAL CYST  AND, LATERAL MASS FUSION 1 LEVEL;  Surgeon: Johnn Hai, MD;  Location: [facility];  Service: Orthopedics;  Laterality: N/A;  . LUMBAR LAMINECTOMY/DECOMPRESSION MICRODISCECTOMY Left 07/06/2014   Procedure:  MICRO LUMBAR DECOMPRESSION L5-S1 ON LEFT, L4-5 REDO;  Surgeon: Johnn Hai, MD;  Location: [facility];  Service: Orthopedics;  Laterality: Left;  . SPINAL CORD STIMULATOR INSERTION N/A 09/11/2016   Procedure: Spinal cord stimulator placement;  Surgeon: Melina Schools, MD;  Location: [place];  Service: Orthopedics;  Laterality: N/A;  . TONSILLECTOMY AND ADENOIDECTOMY    . TRANSTHORACIC ECHOCARDIOGRAM  09-20-2010    normal LVF, ef 55-65%/  mild MR, TR and PR/  mild LAE  . TRANSURETHRAL RESECTION OF BLADDER TUMOR  09-21-2002    reports that he quit smoking about 35 years ago. His smoking use included cigarettes. He has a 40.00 pack-year smoking history. He has never used smokeless tobacco. He reports that he drinks alcohol. He reports that he does not use drugs. family history includes Cancer in his brother, father, and sister; Diabetes in his sister; Heart attack in his mother. Allergies  Allergen Reactions  . Morphine And Related Other (See Comments)    Migraine   . Hydrochlorothiazide Other (See Comments)    REACTION: hyponatremia with daily use  . Tape Itching    Patient  Is ok to use paper tape  . Aspirin   . Sulfur Dioxide   . Latex Rash  . Lisinopril Cough  . Sulfa Antibiotics Rash   Current Outpatient Medications on File Prior to Visit  Medication Sig Dispense Refill  . acetaminophen (TYLENOL) 650 MG CR tablet Take 1,300 mg by mouth at bedtime as needed for pain.    Marland Kitchen albuterol (PROVENTIL HFA;VENTOLIN HFA) 108 (90 Base) MCG/ACT inhaler Inhale 2 puffs into the lungs every 6 (six) hours as needed for wheezing or shortness of breath. 54 g 3  . amLODipine (NORVASC) 10 MG tablet Take 1 tablet (10 mg total) by mouth daily. 90 tablet 3  . Ascorbic Acid (VITAMIN C) 1000 MG tablet Take 1,000 mg by mouth daily.    . brimonidine (ALPHAGAN) 0.15 % ophthalmic solution Place 1 drop into both eyes 2 (two) times daily.    . ciprofloxacin (CIPRO) 500 MG tablet Take 1 tablet (500 mg total) by mouth 2 (two) times daily. 14 tablet 0  . citalopram (CELEXA) 10 MG tablet TAKE 1 TABLET EVERY MORNING 90 tablet 3  . clonazePAM (KLONOPIN) 0.5 MG tablet Take 1 tablet (0.5 mg total) by mouth 2 (two) times daily as needed for anxiety. 60 tablet 0  . clotrimazole (LOTRIMIN) 1 % cream Apply 1 application topically 2 (two) times daily as needed (FOR RASH). Prescribed for rash in groin    . fluticasone (FLONASE) 50 MCG/ACT  nasal spray Place 2 sprays into both nostrils daily. 48 g 0  . gabapentin (NEURONTIN) 300 MG capsule TAKE 1 TO 2 CAPSULES FOUR TIMES DAILY  AFTER MEALS AND AT BEDTIME. 720 capsule 3  . hydrochlorothiazide (HYDRODIURIL) 25 MG tablet Take 0.5 tablets (12.5 mg total) by mouth daily. 45 tablet 3  . hydroxypropyl methylcellulose / hypromellose (ISOPTO TEARS / GONIOVISC) 2.5 % ophthalmic solution Place 1 drop into both eyes 2 (two) times daily.    Marland Kitchen losartan (COZAAR) 100 MG tablet Take 1 tablet (100 mg total) by mouth daily. 90 tablet 3  . meclizine (ANTIVERT) 25 MG tablet Take 1 tablet (25 mg total) by mouth 2 (two) times daily as needed for dizziness. [place]  tablet 0  . methocarbamol (ROBAXIN) 500 MG tablet Take 1 tablet (500 mg total) by mouth 3 (three) times daily as needed for muscle spasms. 21 tablet 0  . nitroGLYCERIN (NITROSTAT) 0.4 MG SL tablet PLACE 1 TABLET UNDER THE TONGUE EVERY 5 MINUTES AS NEEDED FOR CHEST PAIN. MAX 3 DOSES. CALL 911 IF NO IMPROVEMENT AFTER 1ST DOSE (Patient taking differently: Place 0.4 mg under the tongue every 5 (five) minutes as needed for chest pain. ) 25 tablet 0  . Nutritional Supplements (JUICE PLUS FIBRE PO) Take 1 each 2 (two) times daily by mouth.    Marland Kitchen omeprazole (PRILOSEC) 40 MG capsule TAKE 1 CAPSULE TWICE DAILY 180 capsule 3  . ondansetron (ZOFRAN) 4 MG tablet Take 1 tablet (4 mg total) by mouth every 8 (eight) hours as needed for nausea or vomiting. 20 tablet 0  . potassium chloride (K-DUR,KLOR-CON) 10 MEQ tablet TAKE 2 TABLETS EVERY DAY 180 tablet 1  . primidone (MYSOLINE) 250 MG tablet TAKE 1/2 TABLET THREE TIMES DAILY 135 tablet 2  . psyllium (METAMUCIL SMOOTH TEXTURE) 58.6 % powder Take 2 tablespoons daily. Drink 12 oz of water 283 g 12  . simvastatin (ZOCOR) 40 MG tablet TAKE 1 AND 1/2 TABLETS AT BEDTIME (Patient taking differently: Take 60 mg by mouth at bedtime. ) 135 tablet 2  . tamsulosin (FLOMAX) 0.4 MG CAPS capsule Take 1 capsule (0.4 mg total) by mouth  daily. (Patient taking differently: Take 0.8 mg by mouth at bedtime. ) 90 capsule 3  . timolol (TIMOPTIC) 0.5 % ophthalmic solution Place 1 drop into both eyes 2 (two) times daily.   1  . Travoprost, BAK Free, (TRAVATAN Z) 0.004 % SOLN ophthalmic solution Place 1 drop into both eyes at bedtime. 5 mL 5  . triamcinolone (KENALOG) 0.025 % cream Apply 1 application topically 2 (two) times daily as needed (rash).      No current facility-administered medications on file prior to visit.    Review of Systems  Constitutional: Negative for other unusual diaphoresis or sweats HENT: Negative for ear discharge or swelling Eyes: Negative for other worsening visual disturbances Respiratory: Negative for stridor or other swelling  Gastrointestinal: Negative for worsening distension or other blood Genitourinary: Negative for retention or other urinary change Musculoskeletal: Negative for other MSK pain or swelling Skin: Negative for color change or other new lesions Neurological: Negative for worsening tremors and other numbness  Psychiatric/Behavioral: Negative for worsening agitation or other fatigue All other system neg per pt    Objective:   Physical Exam BP (!) 152/84   Pulse 70   Temp 98 F (36.7 C) (Oral)   Ht 5\' 7"  (1.702 m)   Wt 197 lb (89.4 kg)   SpO2 94%   BMI 30.85 kg/m  VS noted, not ill appearing Constitutional: Pt appears in NAD HENT: Head: NCAT.  Right Ear: External ear normal.  Left Ear: External ear normal.  Eyes: . Pupils are equal, round, and reactive to light. Conjunctivae and EOM are normal Nose: without d/c or deformity Neck: Neck supple. Gross normal ROM Cardiovascular: Normal rate and regular rhythm.   Pulmonary/Chest: Effort normal and breath sounds without rales or wheezing.  Abd:  Soft, NT, ND, + BS, no organomegaly Neurological: Pt is alert. At baseline orientation, motor grossly intact Skin: Skin is warm. No rashes, other new lesions, no LE edema Psychiatric:  Pt behavior is normal without agitation  No other exam findings Lab Results  Component Value Date   WBC 6.4  02/23/2018   HGB 14.6 02/23/2018   HCT 42.6 02/23/2018   PLT 212 02/23/2018   GLUCOSE 103 (H) 02/23/2018   CHOL 146 10/14/2017   TRIG 167.0 (H) 10/14/2017   HDL 47.60 10/14/2017   LDLDIRECT 93.6 10/28/2012   LDLCALC 65 10/14/2017   ALT 39 02/23/2018   AST 35 02/23/2018   NA 140 02/23/2018   K 4.5 02/23/2018   CL 105 02/23/2018   CREATININE 0.79 02/23/2018   BUN 16 02/23/2018   CO2 25 02/23/2018   TSH 3.04 10/14/2017   PSA 0.53 11/12/2016   INR 1.15 02/23/2018   HGBA1C 5.4 10/14/2017      Assessment & Plan:

## 2018-04-20 NOTE — Patient Instructions (Addendum)
Please take all new medication as prescribed - the hydralazine 10 mg  Please continue all other medications as before, and refills have been done if requested.  Please have the pharmacy call with any other refills you may need.  Please continue your efforts at being more active, low cholesterol diet, and weight control.  Please keep your appointments with your specialists as you may have planned  Please return in 6 months, or sooner if needed

## 2018-04-20 NOTE — Assessment & Plan Note (Signed)
Mild uncontrolled, to add hydralazine 10 tid

## 2018-04-20 NOTE — Assessment & Plan Note (Signed)
Lab Results  Component Value Date   LDLCALC 65 10/14/2017  stable overall by history and exam, recent data reviewed with pt, and pt to continue medical treatment as before,  to f/u any worsening symptoms or concerns

## 2018-05-21 DIAGNOSIS — M5416 Radiculopathy, lumbar region: Secondary | ICD-10-CM | POA: Diagnosis not present

## 2018-05-21 DIAGNOSIS — M62830 Muscle spasm of back: Secondary | ICD-10-CM | POA: Diagnosis not present

## 2018-05-22 ENCOUNTER — Other Ambulatory Visit: Payer: Self-pay | Admitting: Internal Medicine

## 2018-05-26 ENCOUNTER — Other Ambulatory Visit: Payer: Self-pay | Admitting: Chiropractic Medicine

## 2018-05-26 DIAGNOSIS — M5416 Radiculopathy, lumbar region: Secondary | ICD-10-CM

## 2018-05-31 ENCOUNTER — Ambulatory Visit
Admission: RE | Admit: 2018-05-31 | Discharge: 2018-05-31 | Disposition: A | Discharge status: Home / Self Care | Payer: Medicare Other | Source: Ambulatory Visit | Attending: Chiropractic Medicine | Admitting: Chiropractic Medicine

## 2018-05-31 DIAGNOSIS — M48061 Spinal stenosis, lumbar region without neurogenic claudication: Secondary | ICD-10-CM | POA: Diagnosis not present

## 2018-05-31 DIAGNOSIS — M5416 Radiculopathy, lumbar region: Secondary | ICD-10-CM

## 2018-06-02 ENCOUNTER — Encounter: Payer: Self-pay | Admitting: Podiatry

## 2018-06-02 ENCOUNTER — Ambulatory Visit (INDEPENDENT_AMBULATORY_CARE_PROVIDER_SITE_OTHER): Payer: Medicare Other | Admitting: Podiatry

## 2018-06-02 DIAGNOSIS — B351 Tinea unguium: Secondary | ICD-10-CM

## 2018-06-02 DIAGNOSIS — M79676 Pain in unspecified toe(s): Secondary | ICD-10-CM | POA: Diagnosis not present

## 2018-06-02 NOTE — Progress Notes (Signed)
Complaint:  Visit Type: Patient returns to my office for continued preventative foot care services. Complaint: Patient states" my nails have grown long and thick and become painful to walk and wear shoes" . The patient presents for preventative foot care services. No changes to ROS.  Patient ays his foot surgery right foot is doing well.  Podiatric Exam: Vascular: dorsalis pedis and posterior tibial pulses are palpable bilateral. Capillary return is immediate. Temperature gradient is WNL. Skin turgor WNL  Sensorium: Normal Semmes Weinstein monofilament test. Normal tactile sensation bilaterally. Nail Exam: Pt has thick disfigured discolored nails with subungual debris noted bilateral entire nail hallux through fifth toenails Ulcer Exam: There is no evidence of ulcer or pre-ulcerative changes or infection. Orthopedic Exam: Muscle tone and strength are WNL. No limitations in general ROM. No crepitus or effusions noted. Foot type and digits show no abnormalities. Exostosis 1st MCJ  B/L.   Skin: No Porokeratosis. No infection or ulcers  Diagnosis:  Onychomycosis, , Pain in right toe, pain in left toes  Treatment & Plan Procedures and Treatment: Consent by patient was obtained for treatment procedures.   Debridement of mycotic and hypertrophic toenails, 1 through 5 bilateral and clearing of subungual debris. No ulceration, no infection noted. Post surgery 1st MPJ  Right foot. ABN signed for 2019. Return Visit-Office Procedure: Patient instructed to return to the office for a follow up visit 3 months for continued evaluation and treatment.    Gardiner Barefoot DPM

## 2018-06-08 DIAGNOSIS — M545 Low back pain: Secondary | ICD-10-CM | POA: Diagnosis not present

## 2018-06-08 DIAGNOSIS — M961 Postlaminectomy syndrome, not elsewhere classified: Secondary | ICD-10-CM | POA: Diagnosis not present

## 2018-07-02 DIAGNOSIS — M5137 Other intervertebral disc degeneration, lumbosacral region: Secondary | ICD-10-CM | POA: Diagnosis not present

## 2018-07-19 DIAGNOSIS — M545 Low back pain: Secondary | ICD-10-CM | POA: Diagnosis not present

## 2018-07-19 DIAGNOSIS — G894 Chronic pain syndrome: Secondary | ICD-10-CM | POA: Diagnosis not present

## 2018-07-26 ENCOUNTER — Other Ambulatory Visit: Payer: Self-pay | Admitting: Internal Medicine

## 2018-07-28 ENCOUNTER — Other Ambulatory Visit: Payer: Self-pay | Admitting: Internal Medicine

## 2018-08-11 DIAGNOSIS — Z961 Presence of intraocular lens: Secondary | ICD-10-CM | POA: Diagnosis not present

## 2018-08-11 DIAGNOSIS — H401133 Primary open-angle glaucoma, bilateral, severe stage: Secondary | ICD-10-CM | POA: Diagnosis not present

## 2018-08-11 DIAGNOSIS — H02054 Trichiasis without entropian left upper eyelid: Secondary | ICD-10-CM | POA: Diagnosis not present

## 2018-08-16 ENCOUNTER — Ambulatory Visit (INDEPENDENT_AMBULATORY_CARE_PROVIDER_SITE_OTHER): Payer: Medicare Other

## 2018-08-16 ENCOUNTER — Ambulatory Visit (INDEPENDENT_AMBULATORY_CARE_PROVIDER_SITE_OTHER): Payer: Medicare Other | Admitting: Podiatrist

## 2018-08-16 ENCOUNTER — Other Ambulatory Visit: Payer: Self-pay | Admitting: Podiatrist

## 2018-08-16 DIAGNOSIS — S9031XA Contusion of right foot, initial encounter: Secondary | ICD-10-CM | POA: Diagnosis not present

## 2018-08-16 DIAGNOSIS — M79671 Pain in right foot: Secondary | ICD-10-CM | POA: Diagnosis not present

## 2018-08-16 DIAGNOSIS — S93601A Unspecified sprain of right foot, initial encounter: Secondary | ICD-10-CM | POA: Diagnosis not present

## 2018-08-16 NOTE — Patient Instructions (Signed)
Foot Sprain    A foot sprain is an injury to one of the strong bands of tissue (ligaments) that connect and support the bones in your feet. The ligament can be stretched too much and tear. A tear can be either partial or complete. The severity of the sprain depends on how much of the ligament was damaged or torn.  What are the causes?  This condition is usually caused by suddenly twisting or pivoting your foot.  What increases the risk?  This injury is more likely to occur in people who:  · Play a sport, such as basketball or football.  · Exercise or play a sport without warming up.  · Start a new workout or sport.  · Suddenly increase how long or hard they exercise or play a sport.  · Have previously injured their foot or ankle.  What are the signs or symptoms?  Symptoms of this condition start soon after an injury and include:  · Pain, especially in the arch of the foot.  · Bruising.  · Swelling.  · Inability to walk or use the foot to support body weight.  How is this diagnosed?  This condition is diagnosed with a medical history and physical exam. You may also have imaging tests, such as:  · X-rays to make sure there are no broken bones (fractures).  · An MRI to see if the ligament is torn.  How is this treated?  Treatment for this condition depends on the severity of the sprain.  · Mild sprains can be treated with:  ? Rest, ice, compression, and elevation (RICE).  ? Keeping your foot in a fixed position (immobilization) for a period of time. This is done if your ligament is overstretched or partially torn. Your health care provider will apply a bandage, splint, or walking boot to keep your foot from moving until it heals.  ? Using crutches or a scooter for a few weeks to avoid putting weight on your foot while it is healing.  · Major sprains can be treated with:  ? Surgery. This is done if your ligament is fully torn and a procedure is needed to reconnect it to the bone.  ? A cast or splint. This will be needed  after surgery. A cast or splint will need to stay on your foot while it heals.  · In both types of sprains, you may need to exercise or have physical therapy to strengthen your foot.  Follow these instructions at home:  If you have a bandage, splint, or boot:  · Wear the bandage, splint, or boot as told by your health care provider. Remove only as told by your health care provider.  · Loosen the bandage, splint, or boot if your toes tingle, become numb, or turn cold and blue.  · Keep the bandage, splint, or boot clean and dry.  If you have a cast:  · Do not stick anything inside the cast to scratch your skin. Doing that increases your risk for infection.  · Check the skin around the cast every day. Tell your health care provider about any concerns.  · You may put lotion on dry skin around the edges of the cast. Do not put lotion on the skin underneath the cast.  · Keep the cast clean and dry.  Bathing  · Do not take baths, swim, or use a hot tub until your health care provider approves. Ask your health care provider if you may take showers. You   may only be allowed to take sponge baths.  · If the bandage, splint, boot, or cast is not waterproof:  ? Do not let it get wet.  ? Cover it with a watertight covering when you take a shower.  Managing pain, stiffness, and swelling    · If directed, put ice on the injured area:  ? If you have a removable splint, boot, or immobilizer, remove it as told by your health care provider.  ? Put ice in a plastic bag.  ? Place a towel between your skin and the bag.  ? Leave the ice on for 20 minutes, 2-3 times per day.  · Move your toes often to avoid stiffness and to lessen swelling.  · Raise (elevate) the injured area above the level of your heart while you are sitting or lying down.  Driving  · Do not drive or operate heavy machinery while taking pain medicine.  · Ask your health care provider when it is safe to drive if you have a bandage, splint, or walking boot on your  foot.  Activity  · Do not use the injured foot to support your body weight until your health care provider says that you can. Use crutches or other supportive devices as directed by your health care provider.  · Ask your health care provider what activities are safe for you. Do any exercise or physical therapy as directed.  · Gradually increase how much and how far you walk until your health care provider says it is safe to return to full activity.  General instructions  · If you have a cast, do not put pressure on any part of it until it is fully hardened. This may take several hours.  · Take over-the-counter and prescription medicines only as told by your health care provider.  · When you can walk without pain, wear supportive shoes that have stiff soles. Do not wear flip-flops, and do not walk barefoot.  · Keep all follow-up visits as told by your health care provider. This is important.  Contact a health care provider if:  · Your pain is not controlled with medicine.  · Your bruising or swelling gets worse or does not get better with treatment.  · Your splint, boot, or cast is damaged.  Get help right away if:  · You develop severe numbness or tingling in your foot.  · Your foot turns blue, white, or gray, and it feels cold.  Summary  · A foot sprain is an injury to one of the strong bands of tissue (ligaments) that connect and support the bones in your feet.  · Your health care provider may recommend a splint or boot for your foot to support it while it heals. In some cases, surgery may be needed.  · Physical therapy can help keep your other muscles strong until your foot gets better.  This information is not intended to replace advice given to you by your health care provider. Make sure you discuss any questions you have with your health care provider.  Document Released: 12/20/2001 Document Revised: 07/04/2017 Document Reviewed: 07/04/2017  Elsevier Interactive Patient Education © 2019 [employer]

## 2018-08-17 ENCOUNTER — Encounter: Payer: Self-pay | Admitting: Podiatrist

## 2018-08-17 NOTE — Progress Notes (Signed)
     Chief Complaint  Patient presents with  . Foot Injury    Pt states fell on some steps and injured right foot. Pt states pain is located on dorsal forefoot and in the tip of the 1st toe. Pt states pain when moving toes 1-5 on right foot, and states his foot feels warm.     HPI: Patient is 83 y.o. male who presents today for right foot pain sustained after a fall.    Physical Exam  GENERAL APPEARANCE: Alert, conversant. Appropriately groomed. No acute distress.  VASCULAR: Pedal pulses palpable bilateral.  Capillary refill time is immediate to all digits,  Proximal to distal cooling it warm to warm.  NEUROLOGIC: sensation is intact epicritically and protectively to 5.07 monofilament at 5/5 sites bilateral.  Light touch is intact bilateral, vibratory sensation intact bilateral, achilles tendon reflex is intact bilateral.  MUSCULOSKELETAL: acceptable muscle strength, tone and stability bilateral.  Intrinsic muscluature intact bilateral. Shortened first ray noted from the joint replacement surgery at the first mpj.  Long second metatarsal and second digit present as well.  Pain on the right foot present from his fall-  No discreet area of tenderness identified.    DERMATOLOGIC: skin color, texture, and turger are within normal limits.  No preulcerative lesions are seen, no interdigital maceration noted.    Xray-  Joint implant at the first mpj appears to be in good position no sign of fracture noted.  Long second metatarsal and second toe seen compared to the first.  Assessment   Contusion right foot secondary to fall, foot sprain  Plan  Discussed exam and xray findings.  Recommended a compression wrap to wear around the foot for the next couple weeks and stated the pain should slowly resolve on its own.  He will call if the pain gets worse and otherwise will be seen back for routine care visits.

## 2018-09-01 ENCOUNTER — Ambulatory Visit (INDEPENDENT_AMBULATORY_CARE_PROVIDER_SITE_OTHER): Payer: Medicare Other | Admitting: Podiatrist

## 2018-09-01 ENCOUNTER — Ambulatory Visit: Payer: Medicare Other | Admitting: Podiatry

## 2018-09-01 DIAGNOSIS — B351 Tinea unguium: Secondary | ICD-10-CM

## 2018-09-01 DIAGNOSIS — M79676 Pain in unspecified toe(s): Secondary | ICD-10-CM | POA: Diagnosis not present

## 2018-09-04 ENCOUNTER — Other Ambulatory Visit: Payer: Self-pay | Admitting: Internal Medicine

## 2018-09-07 ENCOUNTER — Encounter: Payer: Self-pay | Admitting: Podiatrist

## 2018-09-07 NOTE — Progress Notes (Signed)
Subjective: Patient is a pleasant 83 year old male who presents today for routine nail care.  Relates the pain in the right foot is improved since the last visit.  Denies any health history changes since last visit.   GENERAL APPEARANCE: Alert, conversant. Appropriately groomed. No acute distress.  VASCULAR: Pedal pulses palpable bilateral.  Capillary refill time is immediate to all digits,  Proximal to distal cooling it warm to warm.  NEUROLOGIC: sensation is intact epicritically and protectively to 5.07 monofilament at 5/5 sites bilateral.  Light touch is intact bilateral, vibratory sensation intact bilateral, achilles tendon reflex is intact bilateral.  MUSCULOSKELETAL: acceptable muscle strength, tone and stability bilateral.  Intrinsic muscluature intact bilateral. Shortened first ray noted from the joint replacement surgery at the first mpj.  Long second metatarsal and second digit present as well.    DERMATOLOGIC: skin color, texture, and turger are within normal limits.  No preulcerative lesions are seen, no interdigital maceration noted.    Patient's toenails are elongated, brittle, friable, dystrophic and painful with palpation and in shoe gear.  Assessment: Symptomatic mycotic toenails  Plan: Debridement of toenails was carried out today without complication.  He will be seen back in 3 months and will call if any concerns arise prior to that visit.

## 2018-09-10 ENCOUNTER — Other Ambulatory Visit: Payer: Self-pay | Admitting: Internal Medicine

## 2018-09-24 DIAGNOSIS — M5137 Other intervertebral disc degeneration, lumbosacral region: Secondary | ICD-10-CM | POA: Diagnosis not present

## 2018-10-03 ENCOUNTER — Other Ambulatory Visit: Payer: Self-pay | Admitting: Internal Medicine

## 2018-10-15 ENCOUNTER — Other Ambulatory Visit: Payer: Self-pay | Admitting: Internal Medicine

## 2018-10-21 ENCOUNTER — Ambulatory Visit: Payer: Medicare Other | Admitting: Internal Medicine

## 2018-11-06 ENCOUNTER — Other Ambulatory Visit: Payer: Self-pay | Admitting: Internal Medicine

## 2018-11-10 ENCOUNTER — Ambulatory Visit: Payer: Medicare Other | Admitting: Podiatrist

## 2018-11-12 ENCOUNTER — Other Ambulatory Visit: Payer: Self-pay | Admitting: Internal Medicine

## 2018-11-18 DIAGNOSIS — M545 Low back pain: Secondary | ICD-10-CM | POA: Diagnosis not present

## 2018-11-18 DIAGNOSIS — F418 Other specified anxiety disorders: Secondary | ICD-10-CM | POA: Diagnosis not present

## 2018-11-18 DIAGNOSIS — M5416 Radiculopathy, lumbar region: Secondary | ICD-10-CM | POA: Diagnosis not present

## 2018-11-24 ENCOUNTER — Other Ambulatory Visit: Payer: Self-pay | Admitting: Internal Medicine

## 2018-11-29 ENCOUNTER — Other Ambulatory Visit: Payer: Self-pay

## 2018-11-29 ENCOUNTER — Ambulatory Visit (INDEPENDENT_AMBULATORY_CARE_PROVIDER_SITE_OTHER): Payer: Medicare Other | Admitting: Internal Medicine

## 2018-11-29 ENCOUNTER — Other Ambulatory Visit (INDEPENDENT_AMBULATORY_CARE_PROVIDER_SITE_OTHER): Payer: Medicare Other

## 2018-11-29 ENCOUNTER — Encounter: Payer: Self-pay | Admitting: Internal Medicine

## 2018-11-29 VITALS — BP 130/60 | HR 69 | Temp 98.7°F | Ht 67.0 in | Wt 201.0 lb

## 2018-11-29 DIAGNOSIS — E785 Hyperlipidemia, unspecified: Secondary | ICD-10-CM

## 2018-11-29 DIAGNOSIS — I10 Essential (primary) hypertension: Secondary | ICD-10-CM

## 2018-11-29 DIAGNOSIS — R7302 Impaired glucose tolerance (oral): Secondary | ICD-10-CM

## 2018-11-29 DIAGNOSIS — M25469 Effusion, unspecified knee: Secondary | ICD-10-CM | POA: Diagnosis not present

## 2018-11-29 LAB — BASIC METABOLIC PANEL
BUN: 15 mg/dL (ref 6–23)
CO2: 26 mEq/L (ref 19–32)
Calcium: 9 mg/dL (ref 8.4–10.5)
Chloride: 103 mEq/L (ref 96–112)
Creatinine, Ser: 0.91 mg/dL (ref 0.40–1.50)
GFR: 79.89 mL/min (ref >60.00)
Glucose, Bld: 110 mg/dL — ABNORMAL HIGH (ref 70–99)
Potassium: 4.2 mEq/L (ref 3.5–5.1)
Sodium: 137 mEq/L (ref 135–145)

## 2018-11-29 LAB — CBC WITH DIFFERENTIAL/PLATELET
Basophils Absolute: 0 10*3/uL (ref 0.0–0.1)
Basophils Relative: 0.9 % (ref 0.0–3.0)
Eosinophils Absolute: 0.2 10*3/uL (ref 0.0–0.7)
Eosinophils Relative: 3.5 % (ref 0.0–5.0)
HCT: 40.3 % (ref 39.0–52.0)
Hemoglobin: 14.4 g/dL (ref 13.0–17.0)
Lymphocytes Relative: 21.7 % (ref 12.0–46.0)
Lymphs Abs: 1.1 10*3/uL (ref 0.7–4.0)
MCHC: 35.7 g/dL (ref 30.0–36.0)
MCV: 92.1 fl (ref 78.0–100.0)
Monocytes Absolute: 0.6 10*3/uL (ref 0.1–1.0)
Monocytes Relative: 11.8 % (ref 3.0–12.0)
Neutro Abs: 3.3 10*3/uL (ref 1.4–7.7)
Neutrophils Relative %: 62.1 % (ref 43.0–77.0)
Platelets: 223 10*3/uL (ref 150.0–400.0)
RBC: 4.38 Mil/uL (ref 4.22–5.81)
RDW: 14.3 % (ref 11.5–15.5)
WBC: 5.3 10*3/uL (ref 4.0–10.5)

## 2018-11-29 LAB — URINALYSIS, ROUTINE W REFLEX MICROSCOPIC
Bilirubin Urine: NEGATIVE
Hgb urine dipstick: NEGATIVE
Ketones, ur: NEGATIVE
Leukocytes,Ua: NEGATIVE
Nitrite: NEGATIVE
RBC / HPF: NONE SEEN (ref 0–?)
Specific Gravity, Urine: 1.02 (ref 1.000–1.030)
Total Protein, Urine: NEGATIVE
Urine Glucose: NEGATIVE
Urobilinogen, UA: 0.2 (ref 0.0–1.0)
WBC, UA: NONE SEEN (ref 0–?)
pH: 6 (ref 5.0–8.0)

## 2018-11-29 LAB — HEPATIC FUNCTION PANEL
ALT: 31 U/L (ref 0–53)
AST: 26 U/L (ref 0–37)
Albumin: 4.4 g/dL (ref 3.5–5.2)
Alkaline Phosphatase: 86 U/L (ref 39–117)
Bilirubin, Direct: 0.1 mg/dL (ref 0.0–0.3)
Total Bilirubin: 0.5 mg/dL (ref 0.2–1.2)
Total Protein: 7 g/dL (ref 6.0–8.3)

## 2018-11-29 LAB — TSH: TSH: 3.01 u[IU]/mL (ref 0.35–4.50)

## 2018-11-29 LAB — LIPID PANEL
Cholesterol: 153 mg/dL (ref 0–200)
HDL: 46.5 mg/dL (ref >39.00)
NonHDL: 106.95
Total CHOL/HDL Ratio: 3
Triglycerides: 225 mg/dL — ABNORMAL HIGH (ref 0.0–149.0)
VLDL: 45 mg/dL — ABNORMAL HIGH (ref 0.0–40.0)

## 2018-11-29 LAB — HEMOGLOBIN A1C: Hgb A1c MFr Bld: 5.7 % (ref 4.6–6.5)

## 2018-11-29 LAB — LDL CHOLESTEROL, DIRECT: Direct LDL: 75 mg/dL

## 2018-11-29 MED ORDER — PREDNISONE 10 MG PO TABS: ORAL_TABLET | ORAL | 0 refills | Status: DC

## 2018-11-29 MED ORDER — METHYLPREDNISOLONE ACETATE 80 MG/ML IJ SUSP
80.0000 mg | Freq: Once | INTRAMUSCULAR | Status: AC
  Administered 2018-11-29: 11:00:00 80 mg via INTRAMUSCULAR

## 2018-11-29 NOTE — Assessment & Plan Note (Signed)
Appears to have bilat left > right ankle effusions and some more proximal leg swelling after working on the deck with kneeling; no evidence for CHF or amlodipine type swelling; suspect tendonitis and/or ankle arthritis or even gout; ok for depomedrol IM 80, predpac asd,  to f/u any worsening symptoms or concerns

## 2018-11-29 NOTE — Progress Notes (Signed)
Subjective:    Patient ID: [name], male    DOB: 1936/02/04, 83 y.o.   MRN: [id number]  HPI  Here with c/o left > right distal leg pain and swelling primarily involving the ankles with some swelling of the pretibial areas L>R but no feet swelling.  Was suggested by a nurse to be check out.  No hx of gout, but said a friend has similar recently.  Seemed to start after kneeling for some time helping the son finish his new deck at the back of the house.  No falls, fever, or other trauma.  Pt denies chest pain, increased sob or doe, wheezing, orthopnea, PND, palpitations, dizziness or syncope.  Pt denies new neurological symptoms such as new headache, or facial or extremity weakness or numbness   Pt denies polydipsia, polyuria.  States RA symptoms have been recently mild to none. Past Medical History:  Diagnosis Date  . Acute bronchitis per pt no fever--  cough since discharge from hospital 07-02-2015   per pcp note 07-05-2015  mild to moderate bronchitis vs pna  . Allergic rhinitis   . Bladder cancer ([place])   . BPH (benign prostatic hyperplasia)   . Bradycardia   . CAD (coronary artery disease)   . Chronic low back pain   . DDD (degenerative disc disease), lumbosacral   . Depression   . Diverticulosis of colon (without mention of hemorrhage)   . Dyspnea   . Essential tremor   . Fatty liver disease, nonalcoholic   . GERD (gastroesophageal reflux disease)   . Glaucoma   . History of bladder cancer    2004--  TCC low-grade , non-invasive  . History of colon polyps   . History of exercise stress test    02-05-2007-- ETT (Clinically positive, electrically equivocal, submaximal ETT,  appropriate bp response to exercise  . History of hiatal hernia   . History of kidney stones   . History of peptic ulcer   . HLD (hyperlipidemia)   . HTN (hypertension)   . Nocturia   . Productive cough   . Rheumatoid arthritis(714.0)   . Right renal artery stenosis ([facility])    per cath 03-14-2009 ---  40-50%  . RLS (restless legs syndrome)   . Vertigo, intermittent   . Weak urinary stream   . Wears dentures    upper  . Wears glasses   . Wears hearing aid    bilateral  . Wears partial dentures    lower   Past Surgical History:  Procedure Laterality Date  . CARDIAC CATHETERIZATION  02-12-2007  dr gregg taylor   Non-obstructive CAD,  20% pLCX,  20% LAD, ef 65%  . CARDIAC CATHETERIZATION  03-14-2009  dr Johnsie Cancel   pLAD and mid diaginal 20-30% multiple lesions,  OM1 20%, right renal ostial 40-50%,  ef 60%  . CATARACT EXTRACTION W/ INTRAOCULAR LENS  IMPLANT, BILATERAL  2016  . CYSTOSCOPY WITH BIOPSY N/A 07/11/2015   Procedure: CYSTOSCOPY WITH COLD CUP RESECTION AND FULGERATION;  Surgeon: Rana Snare, MD;  Location: [facility];  Service: Urology;  Laterality: N/A;  . HIP ARTHROSCOPY W/ LABRAL DEBRIDEMENT Left 06-29-2000   and chondraplasty  . LUMBAR [place] SURGERY  07-16-2005  . LUMBAR LAMINECTOMY/DECOMPRESSION MICRODISCECTOMY  07/09/2011   Procedure: LUMBAR LAMINECTOMY/DECOMPRESSION MICRODISCECTOMY;  Surgeon: Johnn Hai;  Location: [facility];  Service: Orthopedics;  Laterality: Left;  Decompression L5 - S1 on the Left and repair of dura  . LUMBAR LAMINECTOMY/DECOMPRESSION MICRODISCECTOMY N/A 07/27/2013  Procedure: DECOMPRESSION L4 - L5 WITH EXCISION OF SYNOVIAL CYST AND, LATERAL MASS FUSION 1 LEVEL;  Surgeon: Johnn Hai, MD;  Location: [facility];  Service: Orthopedics;  Laterality: N/A;  . LUMBAR LAMINECTOMY/DECOMPRESSION MICRODISCECTOMY Left 07/06/2014   Procedure:  MICRO LUMBAR DECOMPRESSION L5-S1 ON LEFT, L4-5 REDO;  Surgeon: Johnn Hai, MD;  Location: [facility];  Service: Orthopedics;  Laterality: Left;  . SPINAL CORD STIMULATOR INSERTION N/A 09/11/2016   Procedure: Spinal cord stimulator placement;  Surgeon: Melina Schools, MD;  Location: [facility];  Service: Orthopedics;  Laterality: N/A;  . TONSILLECTOMY AND ADENOIDECTOMY    . TRANSTHORACIC ECHOCARDIOGRAM  09-20-2010    normal LVF, ef 55-65%/  mild MR, TR and PR/  mild LAE  . TRANSURETHRAL RESECTION OF BLADDER TUMOR  09-21-2002    reports that he quit smoking about 36 years ago. His smoking use included cigarettes. He has a 40.00 pack-year smoking history. He has never used smokeless tobacco. He reports current alcohol use. He reports that he does not use drugs. family history includes Cancer in his brother, father, and sister; Diabetes in his sister; Heart attack in his mother. Allergies  Allergen Reactions  . Morphine And Related Other (See Comments)    Migraine   . Hydrochlorothiazide Other (See Comments)    REACTION: hyponatremia with daily use  . Tape Itching    Patient  Is ok to use paper tape  . Aspirin   . Sulfur Dioxide   . Latex Rash  . Lisinopril Cough  . Sulfa Antibiotics Rash   Current Outpatient Medications on File Prior to Visit  Medication Sig Dispense Refill  . acetaminophen (TYLENOL) 650 MG CR tablet Take 1,300 mg by mouth at bedtime as needed for pain.    Marland Kitchen albuterol (PROVENTIL HFA;VENTOLIN HFA) 108 (90 Base) MCG/ACT inhaler Inhale 2 puffs into the lungs every 6 (six) hours as needed for wheezing or shortness of breath. 54 g 3  . amLODipine (NORVASC) 10 MG tablet TAKE 1 TABLET (10 MG TOTAL) BY MOUTH DAILY. 90 tablet 1  . Ascorbic Acid (VITAMIN C) 1000 MG tablet Take 1,000 mg by mouth daily.    . brimonidine (ALPHAGAN) 0.15 % ophthalmic solution Place 1 drop into both eyes 2 (two) times daily.    . ciprofloxacin (CIPRO) 500 MG tablet ciprofloxacin 500 mg tablet    . citalopram (CELEXA) 10 MG tablet TAKE 1 TABLET EVERY MORNING 90 tablet 0  . clonazePAM (KLONOPIN) 0.5 MG tablet clonazepam 0.5 mg tablet    . clotrimazole (LOTRIMIN) 1 % cream Apply 1 application topically 2 (two) times daily as needed (FOR RASH). Prescribed for rash in groin    . diazepam (VALIUM) 5 MG tablet as needed.    . fluticasone (FLONASE) 50 MCG/ACT nasal spray USE 2 SPRAYS IN EACH NOSTRIL EVERY DAY 48 g 2  .  gabapentin (NEURONTIN) 300 MG capsule TAKE 1 TO 2 CAPSULES FOUR TIMES DAILY  AFTER MEALS AND AT BEDTIME. 720 capsule 3  . hydrALAZINE (APRESOLINE) 10 MG tablet hydralazine 10 mg tablet    . hydrochlorothiazide (HYDRODIURIL) 25 MG tablet hydrochlorothiazide 25 mg tablet    . hydroxypropyl methylcellulose / hypromellose (ISOPTO TEARS / GONIOVISC) 2.5 % ophthalmic solution Place 1 drop into both eyes 2 (two) times daily.    Marland Kitchen losartan (COZAAR) 100 MG tablet Take 1 tablet (100 mg total) by mouth daily. 90 tablet 3  . meclizine (ANTIVERT) 25 MG tablet TAKE 1 TABLET (25 MG TOTAL) BY MOUTH 2 (TWO) TIMES  DAILY AS NEEDED FOR DIZZINESS. 180 tablet 0  . methocarbamol (ROBAXIN) 500 MG tablet methocarbamol 500 mg tablet  Take 1 tablet 3 times a day by oral route as needed.    . nitroGLYCERIN (NITROSTAT) 0.4 MG SL tablet PLACE 1 TABLET UNDER THE TONGUE EVERY 5 MINUTES AS NEEDED FOR CHEST PAIN. MAX 3 DOSES. CALL 911 IF NO IMPROVEMENT AFTER 1ST DOSE (Patient taking differently: Place 0.4 mg under the tongue every 5 (five) minutes as needed for chest pain. ) 25 tablet 0  . Nutritional Supplements (JUICE PLUS FIBRE PO) Take 1 each 2 (two) times daily by mouth.    Marland Kitchen omeprazole (PRILOSEC) 40 MG capsule TAKE 1 CAPSULE TWICE DAILY 180 capsule 3  . ondansetron (ZOFRAN) 4 MG tablet Take 1 tablet (4 mg total) by mouth every 8 (eight) hours as needed for nausea or vomiting. 20 tablet 0  . potassium chloride (K-DUR,KLOR-CON) 10 MEQ tablet TAKE 2 TABLETS EVERY DAY 180 tablet 1  . primidone (MYSOLINE) 250 MG tablet TAKE 1/2 TABLET THREE TIMES DAILY 135 tablet 0  . psyllium (METAMUCIL SMOOTH TEXTURE) 58.6 % powder Take 2 tablespoons daily. Drink 12 oz of water 283 g 12  . simvastatin (ZOCOR) 40 MG tablet TAKE 1 AND 1/2 TABLETS AT BEDTIME 135 tablet 1  . tamsulosin (FLOMAX) 0.4 MG CAPS capsule Take 1 capsule (0.4 mg total) by mouth daily. (Patient taking differently: Take 0.8 mg by mouth at bedtime. ) 90 capsule 3  . timolol  (TIMOPTIC) 0.5 % ophthalmic solution Place 1 drop into both eyes 2 (two) times daily.   1  . Travoprost, BAK Free, (TRAVATAN Z) 0.004 % SOLN ophthalmic solution Place 1 drop into both eyes at bedtime. 5 mL 5  . triamcinolone (KENALOG) 0.025 % cream Apply 1 application topically 2 (two) times daily as needed (rash).     Karen Kays TO FIND prednisone 10 mg tablets in a dose pack  Take 1 dose pk every day by oral route as directed.     No current facility-administered medications on file prior to visit.    Review of Systems  Constitutional: Negative for other unusual diaphoresis or sweats HENT: Negative for ear discharge or swelling Eyes: Negative for other worsening visual disturbances Respiratory: Negative for stridor or other swelling  Gastrointestinal: Negative for worsening distension or other blood Genitourinary: Negative for retention or other urinary change Musculoskeletal: Negative for other MSK pain or swelling Skin: Negative for color change or other new lesions Neurological: Negative for worsening tremors and other numbness  Psychiatric/Behavioral: Negative for worsening agitation or other fatigue All other system neg per pt    Objective:   Physical Exam BP 130/60 (BP Location: Left Arm, Patient Position: Sitting, Cuff Size: Normal)   Pulse 69   Temp 98.7 F (37.1 C) (Oral)   Ht 5\' 7"  (1.702 m)   Wt 201 lb (91.2 kg)   SpO2 95%   BMI 31.48 kg/m  VS noted,  Constitutional: Pt appears in NAD HENT: Head: NCAT.  Right Ear: External ear normal.  Left Ear: External ear normal.  Eyes: . Pupils are equal, round, and reactive to light. Conjunctivae and EOM are normal Nose: without d/c or deformity Neck: Neck supple. Gross normal ROM Cardiovascular: Normal rate and regular rhythm.   Pulmonary/Chest: Effort normal and breath sounds without rales or wheezing.  Neurological: Pt is alert. At baseline orientation, motor grossly intact Skin: Skin is warm. No rashes, other new  lesions, no pedal edema but has trace  to 1+ LEFT and trace right ankle effusions with some swelling proximally to the upper pretibial areas but none to the feet themselves; has some scrapes to the Left > right pretibial areas too due to trauma with kneeling or other furniture it seems, but no s/s cellulitis Psychiatric: Pt behavior is normal without agitation  No other exam findings  Echo sept 18, 2018 at [facility] c/w EF 76-73%, normal diastolic relaxation, mild AI, mild MR, mild aortic sclerosis without stenosis, mild LAA, normal RV function, nmild TR Lab Results  Component Value Date   WBC 6.4 02/23/2018   HGB 14.6 02/23/2018   HCT 42.6 02/23/2018   PLT 212 02/23/2018   GLUCOSE 103 (H) 02/23/2018   CHOL 146 10/14/2017   TRIG 167.0 (H) 10/14/2017   HDL 47.60 10/14/2017   LDLDIRECT 93.6 10/28/2012   LDLCALC 65 10/14/2017   ALT 39 02/23/2018   AST 35 02/23/2018   NA 140 02/23/2018   K 4.5 02/23/2018   CL 105 02/23/2018   CREATININE 0.79 02/23/2018   BUN 16 02/23/2018   CO2 25 02/23/2018   TSH 3.04 10/14/2017   PSA 0.53 11/12/2016   INR 1.15 02/23/2018   HGBA1C 5.4 10/14/2017       Assessment & Plan:

## 2018-11-29 NOTE — Assessment & Plan Note (Signed)
stable overall by history and exam, recent data reviewed with pt, and pt to continue medical treatment as before,  to f/u any worsening symptoms or concerns, for a1c with labs 

## 2018-11-29 NOTE — Patient Instructions (Addendum)
You had the steroid shot today  Please take all new medication as prescribed - the prednisone  Please continue all other medications as before, and refills have been done if requested.  Please have the pharmacy call with any other refills you may need.  Please continue your efforts at being more active, low cholesterol diet, and weight control  Please keep your appointments with your specialists as you may have planned  Please go to the LAB in the Basement (turn left off the elevator) for the tests to be done today  You will be contacted by phone if any changes need to be made immediately.  Otherwise, you will receive a letter about your results with an explanation, but please check with MyChart first.  Please remember to sign up for MyChart if you have not done so, as this will be important to you in the future with finding out test results, communicating by private email, and scheduling acute appointments online when needed.  Please return in June 11, or sooner if needed

## 2018-11-29 NOTE — Assessment & Plan Note (Signed)
stable overall by history and exam, recent data reviewed with pt, and pt to continue medical treatment as before,  to f/u any worsening symptoms or concerns  

## 2018-11-29 NOTE — Assessment & Plan Note (Signed)
For lower chol diet, and f/u lipids with labs

## 2018-11-29 NOTE — Addendum Note (Signed)
Addended by: Cresenciano Lick on: 11/29/2018 11:30 AM   Modules accepted: Orders

## 2018-12-02 DIAGNOSIS — M5136 Other intervertebral disc degeneration, lumbar region: Secondary | ICD-10-CM | POA: Diagnosis not present

## 2018-12-06 ENCOUNTER — Other Ambulatory Visit: Payer: Self-pay | Admitting: Internal Medicine

## 2018-12-08 ENCOUNTER — Ambulatory Visit: Payer: Medicare Other | Admitting: Podiatrist

## 2018-12-09 ENCOUNTER — Other Ambulatory Visit: Payer: Self-pay | Admitting: Internal Medicine

## 2018-12-21 NOTE — Progress Notes (Addendum)
Subjective:   IVANN TRIMARCO is a 83 y.o. male who presents for Medicare Annual/Subsequent preventive examination. I connected with patient by a telephone and verified that I am speaking with the correct person using two identifiers. Patient stated full name and DOB. Patient gave permission to continue with telephonic visit. Patient's location was at home and Nurse's location was at [place] office.   Review of Systems:  No ROS.  Medicare Wellness Virtual Visit.  Visual/audio telehealth visit, UTA vital signs.   See social history for additional risk factors.  Cardiac Risk Factors include: advanced age (>48men, >76 women);dyslipidemia;male gender;hypertension Sleep patterns: feels rested on waking, gets up 0-1 times nightly to void and sleeps 7-10 hours nightly.    Home Safety/Smoke Alarms: Feels safe in home. Smoke alarms in place.  Living environment; residence and Firearm Safety: 1-story house/ trailer, equipment: Walkers, Type: [employer] fast streamer, Type: Tub Surveyor, quantity. Lives with son, no needs for DME, good support system Seat Belt Safety/Bike Helmet: Wears seat belt.   PSA-  Lab Results  Component Value Date   PSA 0.53 11/12/2016   PSA 0.29 10/28/2012   PSA 0.21 02/07/2008       Objective:    Vitals: There were no vitals taken for this visit.  There is no height or weight on file to calculate BMI.  Advanced Directives 12/22/2018 02/23/2018 12/14/2017 09/03/2017 06/01/2017 01/20/2017 12/12/2016  Does Patient Have a Medical Advance Directive? Yes No No No No Yes No  Type of Paramedic of [place];Living will - - - - Press photographer -  Does patient want to make changes to medical advance directive? - - - No - Patient declined - - -  Copy of [place] in Chart? No - copy requested - - - - - -  Would patient like information on creating a medical advance directive? - - No - Patient declined No - Patient declined - No -  Patient declined Yes (ED - Information included in AVS)  Pre-existing out of facility DNR order (yellow form or pink MOST form) - - - - - - -    Tobacco Social History   Tobacco Use  Smoking Status Former Smoker  . Packs/day: 2.00  . Years: 20.00  . Pack years: 40.00  . Types: Cigarettes  . Last attempt to quit: 10/08/1982  . Years since quitting: 36.2  Smokeless Tobacco Never Used     Counseling given: Not Answered  Past Medical History:  Diagnosis Date  . Acute bronchitis per pt no fever--  cough since discharge from hospital 07-02-2015   per pcp note 07-05-2015  mild to moderate bronchitis vs pna  . Allergic rhinitis   . Bladder cancer ([place])   . BPH (benign prostatic hyperplasia)   . Bradycardia   . CAD (coronary artery disease)   . Chronic low back pain   . DDD (degenerative disc disease), lumbosacral   . Depression   . Diverticulosis of colon (without mention of hemorrhage)   . Dyspnea   . Essential tremor   . Fatty liver disease, nonalcoholic   . GERD (gastroesophageal reflux disease)   . Glaucoma   . History of bladder cancer    2004--  TCC low-grade , non-invasive  . History of colon polyps   . History of exercise stress test    02-05-2007-- ETT (Clinically positive, electrically equivocal, submaximal ETT,  appropriate bp response to exercise  . History of hiatal hernia   .  History of kidney stones   . History of peptic ulcer   . HLD (hyperlipidemia)   . HTN (hypertension)   . Nocturia   . Productive cough   . Rheumatoid arthritis(714.0)   . Right renal artery stenosis ([facility])    per cath 03-14-2009 --- 40-50%  . RLS (restless legs syndrome)   . Vertigo, intermittent   . Weak urinary stream   . Wears dentures    upper  . Wears glasses   . Wears hearing aid    bilateral  . Wears partial dentures    lower   Past Surgical History:  Procedure Laterality Date  . CARDIAC CATHETERIZATION  02-12-2007  dr gregg taylor   Non-obstructive CAD,  20% pLCX,   20% LAD, ef 65%  . CARDIAC CATHETERIZATION  03-14-2009  dr Johnsie Cancel   pLAD and mid diaginal 20-30% multiple lesions,  OM1 20%, right renal ostial 40-50%,  ef 60%  . CATARACT EXTRACTION W/ INTRAOCULAR LENS  IMPLANT, BILATERAL  2016  . CYSTOSCOPY WITH BIOPSY N/A 07/11/2015   Procedure: CYSTOSCOPY WITH COLD CUP RESECTION AND FULGERATION;  Surgeon: Rana Snare, MD;  Location[facility];  Service: Urology;  Laterality: N/A;  . HIP ARTHROSCOPY W/ LABRAL DEBRIDEMENT Left 06-29-2000   and chondraplasty  . LUMBAR [place] SURGERY  07-16-2005  . LUMBAR LAMINECTOMY/DECOMPRESSION MICRODISCECTOMY  07/09/2011   Procedure: LUMBAR LAMINECTOMY/DECOMPRESSION MICRODISCECTOMY;  Surgeon: Johnn Hai;  Location: [facility];  Service: Orthopedics;  Laterality: Left;  Decompression L5 - S1 on the Left and repair of dura  . LUMBAR LAMINECTOMY/DECOMPRESSION MICRODISCECTOMY N/A 07/27/2013   Procedure: DECOMPRESSION L4 - L5 WITH EXCISION OF SYNOVIAL CYST AND, LATERAL MASS FUSION 1 LEVEL;  Surgeon: Johnn Hai, MD;  Location: [facility];  Service: Orthopedics;  Laterality: N/A;  . LUMBAR LAMINECTOMY/DECOMPRESSION MICRODISCECTOMY Left 07/06/2014   Procedure:  MICRO LUMBAR DECOMPRESSION L5-S1 ON LEFT, L4-5 REDO;  Surgeon: Johnn Hai, MD;  Location: [facility];  Service: Orthopedics;  Laterality: Left;  . SPINAL CORD STIMULATOR INSERTION N/A 09/11/2016   Procedure: Spinal cord stimulator placement;  Surgeon: Melina Schools, MD;  Location: [place];  Service: Orthopedics;  Laterality: N/A;  . TONSILLECTOMY AND ADENOIDECTOMY    . TRANSTHORACIC ECHOCARDIOGRAM  09-20-2010   normal LVF, ef 55-65%/  mild MR, TR and PR/  mild LAE  . TRANSURETHRAL RESECTION OF BLADDER TUMOR  09-21-2002   Family History  Problem Relation Age of Onset  . Heart attack Mother   . Cancer Father        lung  . Cancer Sister        lung  . Diabetes Sister   . Cancer Brother        lung that spread to brain   Social History   Socioeconomic  History  . Marital status: Widowed    Spouse name: Not on file  . Number of children: 2  . Years of education: Not on file  . Highest education level: Not on file  Occupational History  . Occupation: retired from [employer]: RETIRED  Social Needs  . Financial resource strain: Not hard at all  . Food insecurity:    Worry: Never true    Inability: Never true  . Transportation needs:    Medical: No    Non-medical: No  Tobacco Use  . Smoking status: Former Smoker    Packs/day: 2.00    Years: 20.00    Pack years: 40.00    Types:  Cigarettes    Last attempt to quit: 10/08/1982    Years since quitting: 36.2  . Smokeless tobacco: Never Used  Substance and Sexual Activity  . Alcohol use: Yes    Comment: rare wine or beer occassionally  . Drug use: No  . Sexual activity: Never  Lifestyle  . Physical activity:    Days per week: 3 days    Minutes per session: 30 min  . Stress: Not at all  Relationships  . Social connections:    Talks on phone: More than three times a week    Gets together: More than three times a week    Attends religious service: More than 4 times per year    Active member of club or organization: Yes    Attends meetings of clubs or organizations: More than 4 times per year    Relationship status: Widowed  Other Topics Concern  . Not on file  Social History Narrative  . Not on file    Outpatient Encounter Medications as of 12/22/2018  Medication Sig  . acetaminophen (TYLENOL) 650 MG CR tablet Take 1,300 mg by mouth at bedtime as needed for pain.  Marland Kitchen albuterol (PROVENTIL HFA;VENTOLIN HFA) 108 (90 Base) MCG/ACT inhaler Inhale 2 puffs into the lungs every 6 (six) hours as needed for wheezing or shortness of breath.  Marland Kitchen amLODipine (NORVASC) 10 MG tablet TAKE 1 TABLET (10 MG TOTAL) BY MOUTH DAILY.  Marland Kitchen Ascorbic Acid (VITAMIN C) 1000 MG tablet Take 1,000 mg by mouth daily.  . brimonidine (ALPHAGAN) 0.15 % ophthalmic solution Place 1 drop into both eyes 2  (two) times daily.  . citalopram (CELEXA) 10 MG tablet TAKE 1 TABLET EVERY MORNING  . clonazePAM (KLONOPIN) 0.5 MG tablet clonazepam 0.5 mg tablet  . clotrimazole (LOTRIMIN) 1 % cream Apply 1 application topically 2 (two) times daily as needed (FOR RASH). Prescribed for rash in groin  . diazepam (VALIUM) 5 MG tablet as needed.  . fluticasone (FLONASE) 50 MCG/ACT nasal spray USE 2 SPRAYS IN EACH NOSTRIL EVERY DAY  . gabapentin (NEURONTIN) 300 MG capsule TAKE 1 TO 2 CAPSULES FOUR TIMES DAILY  AFTER MEALS AND AT BEDTIME.  . hydrALAZINE (APRESOLINE) 10 MG tablet hydralazine 10 mg tablet  . hydrochlorothiazide (HYDRODIURIL) 25 MG tablet TAKE 0.5 TABLETS (12.5 MG TOTAL) BY MOUTH DAILY.  . hydroxypropyl methylcellulose / hypromellose (ISOPTO TEARS / GONIOVISC) 2.5 % ophthalmic solution Place 1 drop into both eyes 2 (two) times daily.  . meclizine (ANTIVERT) 25 MG tablet TAKE 1 TABLET TWICE DAILY AS NEEDED FOR DIZZINESS  . methocarbamol (ROBAXIN) 500 MG tablet methocarbamol 500 mg tablet  Take 1 tablet 3 times a day by oral route as needed.  . nitroGLYCERIN (NITROSTAT) 0.4 MG SL tablet PLACE 1 TABLET UNDER THE TONGUE EVERY 5 MINUTES AS NEEDED FOR CHEST PAIN. MAX 3 DOSES. CALL 911 IF NO IMPROVEMENT AFTER 1ST DOSE (Patient taking differently: Place 0.4 mg under the tongue every 5 (five) minutes as needed for chest pain. )  . Nutritional Supplements (JUICE PLUS FIBRE PO) Take 1 each 2 (two) times daily by mouth.  Marland Kitchen omeprazole (PRILOSEC) 40 MG capsule TAKE 1 CAPSULE TWICE DAILY  . ondansetron (ZOFRAN) 4 MG tablet Take 1 tablet (4 mg total) by mouth every 8 (eight) hours as needed for nausea or vomiting.  . potassium chloride (K-DUR) 10 MEQ tablet TAKE 2 TABLETS EVERY DAY  . primidone (MYSOLINE) 250 MG tablet TAKE 1/2 TABLET THREE TIMES DAILY  . psyllium (METAMUCIL SMOOTH  TEXTURE) 58.6 % powder Take 2 tablespoons daily. Drink 12 oz of water  . simvastatin (ZOCOR) 40 MG tablet TAKE 1 AND 1/2 TABLETS AT BEDTIME   . tamsulosin (FLOMAX) 0.4 MG CAPS capsule Take 1 capsule (0.4 mg total) by mouth daily. (Patient taking differently: Take 0.8 mg by mouth at bedtime. )  . timolol (TIMOPTIC) 0.5 % ophthalmic solution Place 1 drop into both eyes 2 (two) times daily.   . Travoprost, BAK Free, (TRAVATAN Z) 0.004 % SOLN ophthalmic solution Place 1 drop into both eyes at bedtime.  . triamcinolone (KENALOG) 0.025 % cream Apply 1 application topically 2 (two) times daily as needed (rash).   Marland Kitchen losartan (COZAAR) 100 MG tablet TAKE 1 TABLET EVERY DAY  . [DISCONTINUED] ciprofloxacin (CIPRO) 500 MG tablet ciprofloxacin 500 mg tablet  . [DISCONTINUED] predniSONE (DELTASONE) 10 MG tablet 3 tabs by mouth per day for 3 days,2tabs per day for 3 days,1tab per day for 3 days (Patient not taking: Reported on 12/22/2018)  . [DISCONTINUED] UNABLE TO FIND prednisone 10 mg tablets in a dose pack  Take 1 dose pk every day by oral route as directed.   No facility-administered encounter medications on file as of 12/22/2018.     Activities of Daily Living In your present state of health, do you have any difficulty performing the following activities: 12/22/2018  Hearing? Y  Vision? N  Difficulty concentrating or making decisions? N  Walking or climbing stairs? Y  Dressing or bathing? N  Doing errands, shopping? N  Preparing Food and eating ? N  Using the Toilet? N  In the past six months, have you accidently leaked urine? N  Do you have problems with loss of bowel control? N  Managing your Medications? N  Managing your Finances? N  Housekeeping or managing your Housekeeping? N  Some recent data might be hidden    Patient Care Team: Biagio Borg, MD as PCP - General   Assessment:   This is a routine wellness examination for [place]. Physical assessment deferred to PCP.  Exercise Activities and Dietary recommendations Current Exercise Habits: Structured exercise class;Home exercise routine, Type of exercise: [facility] Time (Minutes): 30, Frequency (Times/Week): 3, Weekly Exercise (Minutes/Week): 90, Intensity: Mild, Exercise limited by: orthopedic condition(s)  Diet (meal preparation, eat out, water intake, caffeinated beverages, dairy products, fruits and vegetables): in general, a "healthy" diet  , well balanced Reports good appetite.   Reviewed heart healthy diet. Encouraged patient to maintain daily water and healthy fluid intake.  Goals    . Patient Stated     Stay as active and as independent as possible. Continue to do exercises to maintain strength and balance. Enjoy life and family.    . Stay as socially active as possible, continue to exercise, and eat healthy       Fall Risk Fall Risk  12/22/2018 12/14/2017 10/14/2017 12/12/2016 11/12/2016  Falls in the past year? 1 Yes Yes No No  Comment - - - - -  Number falls in past yr: 1 2 or more 2 or more - -  Injury with Fall? 1 No No - -  Comment - - now s/p right toe surgury - no further falls - -  Risk Factor Category  - High Fall Risk - - -  Risk for fall due to : History of fall(s);Impaired balance/gait;Impaired mobility - - - -  Follow up Falls prevention discussed Falls prevention discussed;Education provided - - -  Depression Screen PHQ 2/9 Scores 12/22/2018 12/14/2017 10/14/2017 12/12/2016  PHQ - 2 Score 1 1 0 1  PHQ- 9 Score 2 3 - -    Cognitive Function MMSE - Mini Mental State Exam 12/14/2017  Orientation to time 5  Orientation to Place 5  Registration 3  Attention/ Calculation 3  Recall 3  Language- name 2 objects 2  Language- repeat 1  Language- follow 3 step command 3  Language- read & follow direction 1  Write a sentence 1  Copy design 1  Total score 28       Ad8 score reviewed for issues:  Issues making decisions: no  Less interest in hobbies / activities: no  Repeats questions, stories (family complaining): no  Trouble using ordinary gadgets (microwave, computer, phone):no  Forgets the  month or year: no  Mismanaging finances: no  Remembering appts: no  Daily problems with thinking and/or memory: no Ad8 score is= 0  Immunization History  Administered Date(s) Administered  . Influenza Split 03/14/2012  . Influenza Whole 05/17/2007, 04/19/2013  . Influenza, High Dose Seasonal PF 04/14/2017, 04/20/2018  . Influenza,inj,Quad PF,6+ Mos 04/05/2014, 06/04/2016  . Influenza,inj,quad, With Preservative 04/12/2018  . Influenza-Unspecified 05/14/2015  . Pneumococcal Conjugate-13 05/16/2014  . Pneumococcal Polysaccharide-23 05/17/2007  . Tdap 11/28/2010  . Zoster 10/25/2013   Screening Tests Health Maintenance  Topic Date Due  . INFLUENZA VACCINE  02/12/2019  . TETANUS/TDAP  11/27/2020  . PNA vac Low Risk Adult  Completed       Plan:    Reviewed health maintenance screenings with patient today and relevant education, vaccines, and/or referrals were provided.   Continue to eat heart healthy diet (full of fruits, vegetables, whole grains, lean protein, water--limit salt, fat, and sugar intake) and increase physical activity as tolerated.  Continue doing brain stimulating activities (puzzles, reading, adult coloring books, staying active) to keep memory sharp.   I have personally reviewed and noted the following in the patient's chart:   . Medical and social history . Use of alcohol, tobacco or illicit drugs  . Current medications and supplements . Functional ability and status . Nutritional status . Physical activity . Advanced directives . List of other physicians . Screenings to include cognitive, depression, and falls . Referrals and appointments  In addition, I have reviewed and discussed with patient certain preventive protocols, quality metrics, and best practice recommendations. A written personalized care plan for preventive services as well as general preventive health recommendations were provided to patient.     Michiel Cowboy, RN  12/22/2018   Medical screening examination/treatment/procedure(s) were performed by non-physician practitioner and as supervising physician I was immediately available for consultation/collaboration. I agree with above. Cathlean Cower, MD

## 2018-12-22 ENCOUNTER — Ambulatory Visit (INDEPENDENT_AMBULATORY_CARE_PROVIDER_SITE_OTHER): Payer: Medicare Other | Admitting: *Deleted

## 2018-12-22 ENCOUNTER — Other Ambulatory Visit: Payer: Self-pay

## 2018-12-22 DIAGNOSIS — Z0000 Encounter for general adult medical examination without abnormal findings: Secondary | ICD-10-CM

## 2018-12-23 ENCOUNTER — Ambulatory Visit: Payer: Medicare Other | Admitting: Internal Medicine

## 2018-12-24 ENCOUNTER — Ambulatory Visit (INDEPENDENT_AMBULATORY_CARE_PROVIDER_SITE_OTHER): Payer: Medicare Other | Admitting: Internal Medicine

## 2018-12-24 ENCOUNTER — Other Ambulatory Visit: Payer: Self-pay

## 2018-12-24 ENCOUNTER — Encounter: Payer: Self-pay | Admitting: Internal Medicine

## 2018-12-24 DIAGNOSIS — M069 Rheumatoid arthritis, unspecified: Secondary | ICD-10-CM

## 2018-12-24 DIAGNOSIS — I10 Essential (primary) hypertension: Secondary | ICD-10-CM | POA: Diagnosis not present

## 2018-12-24 DIAGNOSIS — R7302 Impaired glucose tolerance (oral): Secondary | ICD-10-CM | POA: Diagnosis not present

## 2018-12-24 MED ORDER — ONDANSETRON HCL 4 MG PO TABS: 4.0000 mg | ORAL_TABLET | Freq: Three times a day (TID) | ORAL | 2 refills | Status: DC | PRN

## 2018-12-24 NOTE — Progress Notes (Signed)
Subjective:    Patient ID: PLEZ BELTON, male    DOB: [birth year], 83 y.o.   MRN: [id number]  HPI   Here to f/u; overall doing ok,  Pt denies chest pain, increasing sob or doe, wheezing, orthopnea, PND, increased LE swelling, palpitations, dizziness or syncope.  Pt denies new neurological symptoms such as new headache, or facial or extremity weakness or numbness.  Pt denies polydipsia, polyuria, or low sugar episode.  Pt states overall good compliance with meds, mostly trying to follow appropriate diet, with wt overall stable,  but little exercise however.  Denies worsening reflux, abd pain, dysphagia, bowel change or blood, but does have some occasional nausea, asks for zofran refill.  No new complaints Past Medical History:  Diagnosis Date  . Acute bronchitis per pt no fever--  cough since discharge from hospital 07-02-2015   per pcp note 07-05-2015  mild to moderate bronchitis vs pna  . Allergic rhinitis   . Bladder cancer ([place])   . BPH (benign prostatic hyperplasia)   . Bradycardia   . CAD (coronary artery disease)   . Chronic low back pain   . DDD (degenerative disc disease), lumbosacral   . Depression   . Diverticulosis of colon (without mention of hemorrhage)   . Dyspnea   . Essential tremor   . Fatty liver disease, nonalcoholic   . GERD (gastroesophageal reflux disease)   . Glaucoma   . History of bladder cancer    2004--  TCC low-grade , non-invasive  . History of colon polyps   . History of exercise stress test    02-05-2007-- ETT (Clinically positive, electrically equivocal, submaximal ETT,  appropriate bp response to exercise  . History of hiatal hernia   . History of kidney stones   . History of peptic ulcer   . HLD (hyperlipidemia)   . HTN (hypertension)   . Nocturia   . Productive cough   . Rheumatoid arthritis(714.0)   . Right renal artery stenosis (H[facility])    per cath 03-14-2009 --- 40-50%  . RLS (restless legs syndrome)   . Vertigo, intermittent   . Weak urinary  stream   . Wears dentures    upper  . Wears glasses   . Wears hearing aid    bilateral  . Wears partial dentures    lower   Past Surgical History:  Procedure Laterality Date  . CARDIAC CATHETERIZATION  02-12-2007  dr gregg taylor   Non-obstructive CAD,  20% pLCX,  20% LAD, ef 65%  . CARDIAC CATHETERIZATION  03-14-2009  dr Johnsie Cancel   pLAD and mid diaginal 20-30% multiple lesions,  OM1 20%, right renal ostial 40-50%,  ef 60%  . CATARACT EXTRACTION W/ INTRAOCULAR LENS  IMPLANT, BILATERAL  2016  . CYSTOSCOPY WITH BIOPSY N/A 07/11/2015   Procedure: CYSTOSCOPY WITH COLD CUP RESECTION AND FULGERATION;  Surgeon: Rana Snare, MD;  Location: [facility];  Service: Urology;  Laterality: N/A;  . HIP ARTHROSCOPY W/ LABRAL DEBRIDEMENT Left 06-29-2000   and chondraplasty  . LUMBAR Adrian SURGERY  07-16-2005  . LUMBAR LAMINECTOMY/DECOMPRESSION MICRODISCECTOMY  07/09/2011   Procedure: LUMBAR LAMINECTOMY/DECOMPRESSION MICRODISCECTOMY;  Surgeon: Johnn Hai;  Location: [facility];  Service: Orthopedics;  Laterality: Left;  Decompression L5 - S1 on the Left and repair of dura  . LUMBAR LAMINECTOMY/DECOMPRESSION MICRODISCECTOMY N/A 07/27/2013   Procedure: DECOMPRESSION L4 - L5 WITH EXCISION OF SYNOVIAL CYST AND, LATERAL MASS FUSION 1 LEVEL;  Surgeon: Johnn Hai, MD;  Location: [facility];  Service: Orthopedics;  Laterality: N/A;  . LUMBAR LAMINECTOMY/DECOMPRESSION MICRODISCECTOMY Left 07/06/2014   Procedure:  MICRO LUMBAR DECOMPRESSION L5-S1 ON LEFT, L4-5 REDO;  Surgeon: Johnn Hai, MD;  Location: [facility];  Service: Orthopedics;  Laterality: Left;  . SPINAL CORD STIMULATOR INSERTION N/A 09/11/2016   Procedure: Spinal cord stimulator placement;  Surgeon: Melina Schools, MD;  Location: [facility];  Service: Orthopedics;  Laterality: N/A;  . TONSILLECTOMY AND ADENOIDECTOMY    . TRANSTHORACIC ECHOCARDIOGRAM  09-20-2010   normal LVF, ef 55-65%/  mild MR, TR and PR/  mild LAE  . TRANSURETHRAL RESECTION  OF BLADDER TUMOR  09-21-2002    reports that he quit smoking about 36 years ago. His smoking use included cigarettes. He has a 40.00 pack-year smoking history. He has never used smokeless tobacco. He reports current alcohol use. He reports that he does not use drugs. family history includes Cancer in his brother, father, and sister; Diabetes in his sister; Heart attack in his mother. Allergies  Allergen Reactions  . Morphine And Related Other (See Comments)    Migraine   . Hydrochlorothiazide Other (See Comments)    REACTION: hyponatremia with daily use  . Tape Itching    Patient  Is ok to use paper tape  . Sulfur Dioxide   . Latex Rash  . Lisinopril Cough  . Sulfa Antibiotics Rash   Current Outpatient Medications on File Prior to Visit  Medication Sig Dispense Refill  . acetaminophen (TYLENOL) 650 MG CR tablet Take 1,300 mg by mouth at bedtime as needed for pain.    Marland Kitchen albuterol (PROVENTIL HFA;VENTOLIN HFA) 108 (90 Base) MCG/ACT inhaler Inhale 2 puffs into the lungs every 6 (six) hours as needed for wheezing or shortness of breath. 54 g 3  . amLODipine (NORVASC) 10 MG tablet TAKE 1 TABLET (10 MG TOTAL) BY MOUTH DAILY. 90 tablet 1  . Ascorbic Acid (VITAMIN C) 1000 MG tablet Take 1,000 mg by mouth daily.    . brimonidine (ALPHAGAN) 0.15 % ophthalmic solution Place 1 drop into both eyes 2 (two) times daily.    . citalopram (CELEXA) 10 MG tablet TAKE 1 TABLET EVERY MORNING 90 tablet 0  . clonazePAM (KLONOPIN) 0.5 MG tablet clonazepam 0.5 mg tablet    . clotrimazole (LOTRIMIN) 1 % cream Apply 1 application topically 2 (two) times daily as needed (FOR RASH). Prescribed for rash in groin    . diazepam (VALIUM) 5 MG tablet as needed.    . fluticasone (FLONASE) 50 MCG/ACT nasal spray USE 2 SPRAYS IN EACH NOSTRIL EVERY DAY 48 g 2  . gabapentin (NEURONTIN) 300 MG capsule TAKE 1 TO 2 CAPSULES FOUR TIMES DAILY  AFTER MEALS AND AT BEDTIME. 720 capsule 3  . hydrALAZINE (APRESOLINE) 10 MG tablet  hydralazine 10 mg tablet    . hydrochlorothiazide (HYDRODIURIL) 25 MG tablet TAKE 0.5 TABLETS (12.5 MG TOTAL) BY MOUTH DAILY. 45 tablet 2  . hydroxypropyl methylcellulose / hypromellose (ISOPTO TEARS / GONIOVISC) 2.5 % ophthalmic solution Place 1 drop into both eyes 2 (two) times daily.    Marland Kitchen losartan (COZAAR) 100 MG tablet TAKE 1 TABLET EVERY DAY 90 tablet 1  . meclizine (ANTIVERT) 25 MG tablet TAKE 1 TABLET TWICE DAILY AS NEEDED FOR DIZZINESS 180 tablet 1  . methocarbamol (ROBAXIN) 500 MG tablet methocarbamol 500 mg tablet  Take 1 tablet 3 times a day by oral route as needed.    . nitroGLYCERIN (NITROSTAT) 0.4 MG SL tablet PLACE 1 TABLET UNDER THE TONGUE EVERY  5 MINUTES AS NEEDED FOR CHEST PAIN. MAX 3 DOSES. CALL 911 IF NO IMPROVEMENT AFTER 1ST DOSE (Patient taking differently: Place 0.4 mg under the tongue every 5 (five) minutes as needed for chest pain. ) 25 tablet 0  . Nutritional Supplements (JUICE PLUS FIBRE PO) Take 1 each 2 (two) times daily by mouth.    Marland Kitchen omeprazole (PRILOSEC) 40 MG capsule TAKE 1 CAPSULE TWICE DAILY 180 capsule 3  . potassium chloride (K-DUR) 10 MEQ tablet TAKE 2 TABLETS EVERY DAY 180 tablet 1  . primidone (MYSOLINE) 250 MG tablet TAKE 1/2 TABLET THREE TIMES DAILY 135 tablet 0  . psyllium (METAMUCIL SMOOTH TEXTURE) 58.6 % powder Take 2 tablespoons daily. Drink 12 oz of water 283 g 12  . simvastatin (ZOCOR) 40 MG tablet TAKE 1 AND 1/2 TABLETS AT BEDTIME 135 tablet 1  . tamsulosin (FLOMAX) 0.4 MG CAPS capsule Take 1 capsule (0.4 mg total) by mouth daily. (Patient taking differently: Take 0.8 mg by mouth at bedtime. ) 90 capsule 3  . timolol (TIMOPTIC) 0.5 % ophthalmic solution Place 1 drop into both eyes 2 (two) times daily.   1  . Travoprost, BAK Free, (TRAVATAN Z) 0.004 % SOLN ophthalmic solution Place 1 drop into both eyes at bedtime. 5 mL 5  . triamcinolone (KENALOG) 0.025 % cream Apply 1 application topically 2 (two) times daily as needed (rash).      No current  facility-administered medications on file prior to visit.    Review of Systems  Constitutional: Negative for other unusual diaphoresis or sweats HENT: Negative for ear discharge or swelling Eyes: Negative for other worsening visual disturbances Respiratory: Negative for stridor or other swelling  Gastrointestinal: Negative for worsening distension or other blood Genitourinary: Negative for retention or other urinary change Musculoskeletal: Negative for other MSK pain or swelling Skin: Negative for color change or other new lesions Neurological: Negative for worsening tremors and other numbness  Psychiatric/Behavioral: Negative for worsening agitation or other fatigue All other system neg per pt    Objective:   Physical Exam BP 124/82   Pulse 84   Temp 98.5 F (36.9 C) (Oral)   Ht 5\' 7"  (1.702 m)   Wt 199 lb (90.3 kg)   SpO2 93%   BMI 31.17 kg/m  VS noted,  Constitutional: Pt appears in NAD HENT: Head: NCAT.  Right Ear: External ear normal.  Left Ear: External ear normal.  Eyes: . Pupils are equal, round, and reactive to light. Conjunctivae and EOM are normal Nose: without d/c or deformity Neck: Neck supple. Gross normal ROM Cardiovascular: Normal rate and regular rhythm.   Pulmonary/Chest: Effort normal and breath sounds without rales or wheezing.  Abd:  Soft, NT, ND, + BS, no organomegaly Neurological: Pt is alert. At baseline orientation, motor grossly intact Skin: Skin is warm. No rashes, other new lesions, no LE edema Psychiatric: Pt behavior is normal without agitation  No other exam findings Lab Results  Component Value Date   WBC 5.3 11/29/2018   HGB 14.4 11/29/2018   HCT 40.3 11/29/2018   PLT 223.0 11/29/2018   GLUCOSE 110 (H) 11/29/2018   CHOL 153 11/29/2018   TRIG 225.0 (H) 11/29/2018   HDL 46.50 11/29/2018   LDLDIRECT 75.0 11/29/2018   LDLCALC 65 10/14/2017   ALT 31 11/29/2018   AST 26 11/29/2018   NA 137 11/29/2018   K 4.2 11/29/2018   CL 103  11/29/2018   CREATININE 0.91 11/29/2018   BUN 15 11/29/2018   CO2  26 11/29/2018   TSH 3.01 11/29/2018   PSA 0.53 11/12/2016   INR 1.15 02/23/2018   HGBA1C 5.7 11/29/2018      Assessment & Plan:

## 2018-12-24 NOTE — Patient Instructions (Addendum)
Please continue all other medications as before, and refills have been done if requested - the zofran  Please have the pharmacy call with any other refills you may need.  Please continue your efforts at being more active, low cholesterol diet, and weight control.  You are otherwise up to date with prevention measures today.  Please keep your appointments with your specialists as you may have planned  Please return in 6 months, or sooner if needed

## 2018-12-26 ENCOUNTER — Encounter: Payer: Self-pay | Admitting: Internal Medicine

## 2018-12-26 NOTE — Assessment & Plan Note (Signed)
stable overall by history and exam, recent data reviewed with pt, and pt to continue medical treatment as before,  to f/u any worsening symptoms or concerns  

## 2018-12-26 NOTE — Assessment & Plan Note (Signed)
No evidence of flare today, cont same tx,  to f/u any worsening symptoms or concerns

## 2019-01-04 DIAGNOSIS — M5136 Other intervertebral disc degeneration, lumbar region: Secondary | ICD-10-CM | POA: Diagnosis not present

## 2019-01-18 ENCOUNTER — Encounter: Payer: Self-pay | Admitting: Podiatry

## 2019-01-18 ENCOUNTER — Ambulatory Visit (INDEPENDENT_AMBULATORY_CARE_PROVIDER_SITE_OTHER): Payer: Medicare Other | Admitting: Podiatry

## 2019-01-18 ENCOUNTER — Other Ambulatory Visit: Payer: Self-pay

## 2019-01-18 ENCOUNTER — Ambulatory Visit (INDEPENDENT_AMBULATORY_CARE_PROVIDER_SITE_OTHER): Payer: Medicare Other

## 2019-01-18 VITALS — Temp 97.2°F

## 2019-01-18 DIAGNOSIS — B351 Tinea unguium: Secondary | ICD-10-CM | POA: Diagnosis not present

## 2019-01-18 DIAGNOSIS — M7752 Other enthesopathy of left foot: Secondary | ICD-10-CM

## 2019-01-18 DIAGNOSIS — M7751 Other enthesopathy of right foot: Secondary | ICD-10-CM

## 2019-01-18 DIAGNOSIS — M79676 Pain in unspecified toe(s): Secondary | ICD-10-CM

## 2019-01-18 NOTE — Patient Instructions (Signed)

## 2019-01-19 ENCOUNTER — Other Ambulatory Visit: Payer: Self-pay | Admitting: Internal Medicine

## 2019-01-20 NOTE — Progress Notes (Signed)
Subjective: [name] presents today with painful, thick toenails 1-5 b/l that he cannot cut and which interfere with daily activities.  Pain is aggravated when wearing enclosed shoe gear.  Today, he relates b/l ankle pain.   Biagio Borg, MD is his PCP   Current Outpatient Medications:  .  acetaminophen (TYLENOL) 650 MG CR tablet, Take 1,300 mg by mouth at bedtime as needed for pain., Disp: , Rfl:  .  albuterol (PROVENTIL HFA;VENTOLIN HFA) 108 (90 Base) MCG/ACT inhaler, Inhale 2 puffs into the lungs every 6 (six) hours as needed for wheezing or shortness of breath., Disp: 54 g, Rfl: 3 .  amLODipine (NORVASC) 10 MG tablet, TAKE 1 TABLET (10 MG TOTAL) BY MOUTH DAILY., Disp: 90 tablet, Rfl: 1 .  Ascorbic Acid (VITAMIN C) 1000 MG tablet, Take 1,000 mg by mouth daily., Disp: , Rfl:  .  brimonidine (ALPHAGAN) 0.15 % ophthalmic solution, Place 1 drop into both eyes 2 (two) times daily., Disp: , Rfl:  .  citalopram (CELEXA) 10 MG tablet, TAKE 1 TABLET EVERY MORNING, Disp: 90 tablet, Rfl: 0 .  clonazePAM (KLONOPIN) 0.5 MG tablet, clonazepam 0.5 mg tablet, Disp: , Rfl:  .  clotrimazole (LOTRIMIN) 1 % cream, Apply 1 application topically 2 (two) times daily as needed (FOR RASH). Prescribed for rash in groin, Disp: , Rfl:  .  diazepam (VALIUM) 5 MG tablet, as needed., Disp: , Rfl:  .  fluticasone (FLONASE) 50 MCG/ACT nasal spray, USE 2 SPRAYS IN EACH NOSTRIL EVERY DAY, Disp: 48 g, Rfl: 2 .  gabapentin (NEURONTIN) 300 MG capsule, TAKE 1 TO 2 CAPSULES FOUR TIMES DAILY  AFTER MEALS AND AT BEDTIME., Disp: 720 capsule, Rfl: 3 .  hydrALAZINE (APRESOLINE) 10 MG tablet, TAKE 1 TABLET THREE TIMES DAILY AS NEEDED FOR BLOOD PRESSURE MORE THAN 150/90, Disp: 270 tablet, Rfl: 1 .  hydrochlorothiazide (HYDRODIURIL) 25 MG tablet, TAKE 0.5 TABLETS (12.5 MG TOTAL) BY MOUTH DAILY., Disp: 45 tablet, Rfl: 2 .  hydroxypropyl methylcellulose / hypromellose (ISOPTO TEARS / GONIOVISC) 2.5 % ophthalmic solution, Place 1 drop  into both eyes 2 (two) times daily., Disp: , Rfl:  .  losartan (COZAAR) 100 MG tablet, TAKE 1 TABLET EVERY DAY, Disp: 90 tablet, Rfl: 1 .  meclizine (ANTIVERT) 25 MG tablet, TAKE 1 TABLET TWICE DAILY AS NEEDED FOR DIZZINESS, Disp: 180 tablet, Rfl: 1 .  methocarbamol (ROBAXIN) 500 MG tablet, methocarbamol 500 mg tablet  Take 1 tablet 3 times a day by oral route as needed., Disp: , Rfl:  .  nitroGLYCERIN (NITROSTAT) 0.4 MG SL tablet, PLACE 1 TABLET UNDER THE TONGUE EVERY 5 MINUTES AS NEEDED FOR CHEST PAIN. MAX 3 DOSES. CALL 911 IF NO IMPROVEMENT AFTER 1ST DOSE (Patient taking differently: Place 0.4 mg under the tongue every 5 (five) minutes as needed for chest pain. ), Disp: 25 tablet, Rfl: 0 .  Nutritional Supplements (JUICE PLUS FIBRE PO), Take 1 each 2 (two) times daily by mouth., Disp: , Rfl:  .  omeprazole (PRILOSEC) 40 MG capsule, TAKE 1 CAPSULE TWICE DAILY, Disp: 180 capsule, Rfl: 3 .  ondansetron (ZOFRAN) 4 MG tablet, Take 1 tablet (4 mg total) by mouth every 8 (eight) hours as needed for nausea or vomiting., Disp: 30 tablet, Rfl: 2 .  potassium chloride (K-DUR) 10 MEQ tablet, TAKE 2 TABLETS EVERY DAY, Disp: 180 tablet, Rfl: 1 .  primidone (MYSOLINE) 250 MG tablet, TAKE 1/2 TABLET THREE TIMES DAILY, Disp: 135 tablet, Rfl: 0 .  psyllium (METAMUCIL SMOOTH  TEXTURE) 58.6 % powder, Take 2 tablespoons daily. Drink 12 oz of water, Disp: 283 g, Rfl: 12 .  simvastatin (ZOCOR) 40 MG tablet, TAKE 1 AND 1/2 TABLETS AT BEDTIME, Disp: 135 tablet, Rfl: 1 .  tamsulosin (FLOMAX) 0.4 MG CAPS capsule, Take 1 capsule (0.4 mg total) by mouth daily. (Patient taking differently: Take 0.8 mg by mouth at bedtime. ), Disp: 90 capsule, Rfl: 3 .  timolol (TIMOPTIC) 0.5 % ophthalmic solution, Place 1 drop into both eyes 2 (two) times daily. , Disp: , Rfl: 1 .  Travoprost, BAK Free, (TRAVATAN Z) 0.004 % SOLN ophthalmic solution, Place 1 drop into both eyes at bedtime., Disp: 5 mL, Rfl: 5 .  triamcinolone (KENALOG) 0.025 %  cream, Apply 1 application topically 2 (two) times daily as needed (rash). , Disp: , Rfl:   Allergies  Allergen Reactions  . Morphine And Related Other (See Comments)    Migraine   . Hydrochlorothiazide Other (See Comments)    REACTION: hyponatremia with daily use  . Tape Itching    Patient  Is ok to use paper tape  . Sulfur Dioxide   . Latex Rash  . Lisinopril Cough  . Sulfa Antibiotics Rash    Objective: Vitals:   01/18/19 1524  Temp: (!) 97.2 F (36.2 C)    Vascular Examination: Capillary refill time immediate x 10 digits.  Dorsalis pedis and Posterior tibial pulses palpable b/l.  Digital hair sparse x 10 digits.  Skin temperature gradient WNL b/l.  Dermatological Examination: Skin with normal turgor, texture and tone b/l.  Toenails 1-5 b/l discolored, thick, dystrophic with subungual debris and pain with palpation to nailbeds due to thickness of nails.  Musculoskeletal: Muscle strength 5/5 to all LE muscle groups.  Short first ray.  Elongated 2nd digits b/l.  Pain b/l ankles with active and passive ROM. No edema, no effusions.   Pain along peroneals tendons b/l.  Neurological: Sensation intact with 10 gram monofilament.  Vibratory sensation intact.  Assessment: Painful onychomycosis toenails 1-5 b/l  Peroneal tendonitis b/l  Xrays b/l ankles: Left:  No evidence of DJD. No fractures. Right: +posterior calcaneal spur; no fractures  Plan: 1. Toenails 1-5 b/l were debrided in length and girth without iatrogenic bleeding. 2. Dispensed trilock ankle brace for b/l tendonitis. 3. Xrays taken/reviewed with patients b/l ankles. 4. Patient to continue soft, supportive shoe gear daily. 5. Patient to report any pedal injuries to medical professional immediately. 6. Follow up 3 months.  7. Patient/POA to call should there be a concern in the interim.

## 2019-01-22 ENCOUNTER — Other Ambulatory Visit: Payer: Self-pay | Admitting: Internal Medicine

## 2019-01-28 ENCOUNTER — Other Ambulatory Visit: Payer: Self-pay | Admitting: Internal Medicine

## 2019-02-15 ENCOUNTER — Emergency Department (HOSPITAL_COMMUNITY)
Admission: EM | Admit: 2019-02-15 | Discharge: 2019-02-15 | Disposition: A | Discharge status: Home / Self Care | Payer: Medicare Other | Attending: Emergency Medicine | Admitting: Emergency Medicine

## 2019-02-15 ENCOUNTER — Emergency Department (HOSPITAL_COMMUNITY): Payer: Medicare Other

## 2019-02-15 ENCOUNTER — Ambulatory Visit: Payer: Self-pay | Admitting: *Deleted

## 2019-02-15 ENCOUNTER — Other Ambulatory Visit: Payer: Self-pay

## 2019-02-15 ENCOUNTER — Encounter (HOSPITAL_COMMUNITY): Payer: Self-pay

## 2019-02-15 DIAGNOSIS — I2510 Atherosclerotic heart disease of native coronary artery without angina pectoris: Secondary | ICD-10-CM | POA: Diagnosis not present

## 2019-02-15 DIAGNOSIS — W06XXXA Fall from bed, initial encounter: Secondary | ICD-10-CM | POA: Insufficient documentation

## 2019-02-15 DIAGNOSIS — Z91040 Latex allergy status: Secondary | ICD-10-CM | POA: Insufficient documentation

## 2019-02-15 DIAGNOSIS — Y92013 Bedroom of single-family (private) house as the place of occurrence of the external cause: Secondary | ICD-10-CM | POA: Diagnosis not present

## 2019-02-15 DIAGNOSIS — Z79899 Other long term (current) drug therapy: Secondary | ICD-10-CM | POA: Diagnosis not present

## 2019-02-15 DIAGNOSIS — Z87891 Personal history of nicotine dependence: Secondary | ICD-10-CM | POA: Insufficient documentation

## 2019-02-15 DIAGNOSIS — I509 Heart failure, unspecified: Secondary | ICD-10-CM | POA: Diagnosis not present

## 2019-02-15 DIAGNOSIS — Z8551 Personal history of malignant neoplasm of bladder: Secondary | ICD-10-CM | POA: Insufficient documentation

## 2019-02-15 DIAGNOSIS — Y9384 Activity, sleeping: Secondary | ICD-10-CM | POA: Insufficient documentation

## 2019-02-15 DIAGNOSIS — M25512 Pain in left shoulder: Secondary | ICD-10-CM | POA: Diagnosis not present

## 2019-02-15 DIAGNOSIS — S20212A Contusion of left front wall of thorax, initial encounter: Secondary | ICD-10-CM

## 2019-02-15 DIAGNOSIS — S0083XA Contusion of other part of head, initial encounter: Secondary | ICD-10-CM

## 2019-02-15 DIAGNOSIS — I110 Hypertensive heart disease with heart failure: Secondary | ICD-10-CM | POA: Insufficient documentation

## 2019-02-15 DIAGNOSIS — W19XXXA Unspecified fall, initial encounter: Secondary | ICD-10-CM

## 2019-02-15 DIAGNOSIS — S4992XA Unspecified injury of left shoulder and upper arm, initial encounter: Secondary | ICD-10-CM | POA: Diagnosis not present

## 2019-02-15 DIAGNOSIS — S40012A Contusion of left shoulder, initial encounter: Secondary | ICD-10-CM

## 2019-02-15 DIAGNOSIS — Y998 Other external cause status: Secondary | ICD-10-CM | POA: Diagnosis not present

## 2019-02-15 DIAGNOSIS — R0781 Pleurodynia: Secondary | ICD-10-CM | POA: Diagnosis not present

## 2019-02-15 DIAGNOSIS — S299XXA Unspecified injury of thorax, initial encounter: Secondary | ICD-10-CM | POA: Diagnosis not present

## 2019-02-15 MED ORDER — LIDOCAINE 5 % EX PTCH: 1.0000 | MEDICATED_PATCH | CUTANEOUS | 0 refills | Status: DC

## 2019-02-15 MED ORDER — HYDROCODONE-ACETAMINOPHEN 5-325 MG PO TABS: 1.0000 | ORAL_TABLET | Freq: Four times a day (QID) | ORAL | 0 refills | Status: DC | PRN

## 2019-02-15 NOTE — Telephone Encounter (Signed)
Ok for the 420 slot

## 2019-02-15 NOTE — Telephone Encounter (Signed)
Pt called stating that he rolled out of bed las night; he thinks he cracked a rib on his left side because it hurts when he breaths, and he has soreness; recommendations made per nurse triage protocol; the pt would like to be see in the office; he sees Dr Jenny Reichmann, [place] [facility]; spoke with Tammy, and per Dr Gwynn Burly assistant, the pt should be seen in the ED; pt notified and verbalizes understanding; will route to office for notification.  Reason for Disposition . [1] Can't take a deep breath BUT [2] no respiratory distress  Answer Assessment - Initial Assessment Questions 1. MECHANISM: "How did the injury happen?"     Pt rolled out of bed on 02/14/2019 at 2300 2. ONSET: "When did the injury happen?" (Minutes or hours ago)     on 02/14/2019 at 2300 3. LOCATION: "Where on the chest is the injury located?"     Left chest between breast and axillary area 4. APPEARANCE: "What does the injury look like?"    normal 5. BLEEDING: "Is there any bleeding now? If so, ask: How long has it been bleeding?"     no 6. SEVERITY: "Any difficulty with breathing?"  hurts when he takes a deep breath 7. SIZE: For cuts, bruises, or swelling, ask: "How large is it?" (e.g., inches or centimeters)   no 8. PAIN: "Is there pain?" If so, ask: "How bad is the pain?"   (e.g., Scale 1-10; or mild, moderate, severe)     Rated 10 out of 10;sharp pain 9. TETANUS: For any breaks in the skin, ask: "When was the last tetanus booster?"     Not sure 10. PREGNANCY: "Is there any chance you are pregnant?" "When was your last menstrual period?"       n/a  Protocols used: CHEST INJURY-A-AH

## 2019-02-15 NOTE — ED Triage Notes (Signed)
Pt states that he rolled out of bed last night, injuring his left chest area and shoulder.

## 2019-02-15 NOTE — Discharge Instructions (Signed)
Your x-rays today showed no sign of broken ribs and your shoulder x-ray was normal other than arthritis.  Take the pain medication as needed if the lidocaine patches and Tylenol are not working.  If you develop sudden shortness of breath, passing out or pain that is not controlled please return for repeat check.  You should be rechecked by your doctor in 1 week to ensure your pain is improving and you are feeling better.

## 2019-02-15 NOTE — ED Notes (Signed)
Pt had ortho static changes when going to Xray. MD made aware

## 2019-02-15 NOTE — Telephone Encounter (Signed)
Pt is currently at [facility] ED

## 2019-02-15 NOTE — ED Provider Notes (Signed)
[facility] DEPT Provider Note   CSN: [id number] Arrival date & time: 02/15/19  0919     History   Chief Complaint Chief Complaint  Patient presents with  . Fall    HPI [name] is a 83 y.o. male.     Patient is an 83 year old male with a history of CAD, CHF, pulmonary hypertension, spinal stenosis status post stimulator placement, hypertension and rheumatoid arthritis who is presenting today with left shoulder and left-sided rib pain.  Patient states that he fell out of bed last night and landed on the carpeted floor and the trash can next to his bed.  Since that time he has had significant pain in the left side of his chest that is 8 out of 10 and worse with movement and taking a deep breath.  He also is having pain in his left shoulder.  He states he did have some issues with his shoulder prior to this but now it is hurting worse.  He hit his left eyebrow but denies loss of consciousness, headache, neck pain or feeling woozy or vision change.  He has been able to ambulate without difficulty and denies any pain in his back.  He has no abdominal pain.  He does take an aspirin daily but denies any other anticoagulation.  The history is provided by the patient.  Fall This is a new problem. The current episode started 6 to 12 hours ago. The problem occurs constantly. The problem has not changed since onset.Associated symptoms include chest pain. Pertinent negatives include no headaches and no shortness of breath. Associated symptoms comments: Left shoulder pain . The symptoms are aggravated by bending, twisting and coughing (deep breathing). The symptoms are relieved by rest (not moving). He has tried acetaminophen for the symptoms. The treatment provided no relief.    Past Medical History:  Diagnosis Date  . Acute bronchitis per pt no fever--  cough since discharge from hospital 07-02-2015   per pcp note 07-05-2015  mild to moderate bronchitis vs pna  .  Allergic rhinitis   . Bladder cancer ([place])   . BPH (benign prostatic hyperplasia)   . Bradycardia   . CAD (coronary artery disease)   . Chronic low back pain   . DDD (degenerative disc disease), lumbosacral   . Depression   . Diverticulosis of colon (without mention of hemorrhage)   . Dyspnea   . Essential tremor   . Fatty liver disease, nonalcoholic   . GERD (gastroesophageal reflux disease)   . Glaucoma   . History of bladder cancer    2004--  TCC low-grade , non-invasive  . History of colon polyps   . History of exercise stress test    02-05-2007-- ETT (Clinically positive, electrically equivocal, submaximal ETT,  appropriate bp response to exercise  . History of hiatal hernia   . History of kidney stones   . History of peptic ulcer   . HLD (hyperlipidemia)   . HTN (hypertension)   . Nocturia   . Productive cough   . Rheumatoid arthritis(714.0)   . Right renal artery stenosis (HCC)    per cath 03-14-2009 --- 40-50%  . RLS (restless legs syndrome)   . Vertigo, intermittent   . Weak urinary stream   . Wears dentures    upper  . Wears glasses   . Wears hearing aid    bilateral  . Wears partial dentures    lower    Patient Active Problem List  Diagnosis Date Noted  . Joint swelling of lower leg 11/29/2018  . Abnormal urinalysis 02/25/2018  . Anxiety 01/08/2018  . Laceration of hand 12/18/2017  . Pain of left hand 12/18/2017  . Laceration of scalp without foreign body 06/09/2017  . Nonintractable headache 06/09/2017  . Acute sinus infection 04/14/2017  . Nausea and vomiting 03/27/2017  . Hyponatremia 03/17/2017  . Right lumbar radiculopathy 01/21/2017  . Fall 01/21/2017  . Synovial cyst 01/21/2017  . Chronic pain 09/11/2016  . Generalized weakness 05/22/2016  . UTI (urinary tract infection) 05/22/2016  . Weakness 05/22/2016  . Fever blister 10/18/2015  . Dyspnea 10/17/2015  . Bradycardia 09/17/2015  . Chest pain at rest 09/17/2015  . CAD in native artery  09/17/2015  . CHF (congestive heart failure) ([place]) 09/17/2015  . Pulmonary hypertension ([place]) 09/17/2015  . Bladder cancer ([place]) 09/17/2015  . Essential tremor 09/17/2015  . Chest pain 09/17/2015  . Anginal chest pain at rest [facility]) 09/17/2015  . Hypokalemia 07/13/2015  . Vertigo 07/01/2015  . Dizzinesses 07/01/2015  . Spinal stenosis of lumbar region at multiple levels 07/06/2014  . Angular cheilitis 05/05/2014  . Left lumbar radiculopathy 05/04/2014  . Right elbow pain 12/29/2013  . Lateral epicondylitis of right elbow 12/29/2013  . Cough 09/14/2013  . Spinal stenosis, lumbar 07/27/2013  . Palpable mass of neck 07/21/2013  . Vasovagal episode 01/10/2013  . Left otitis media 10/28/2012  . Impaired glucose tolerance 10/28/2012  . Cellulitis 04/24/2012  . Hemorrhoids 04/24/2012  . Syncope 04/21/2012  . Preventative health care 11/28/2010  . Depression 09/12/2010  . RENAL ARTERY STENOSIS 09/12/2010  . FATIGUE 09/12/2010  . BUNION, RIGHT FOOT 02/05/2010  . RECTAL BLEEDING 07/10/2009  . Diarrhea 07/10/2009  . DIVERTICULOSIS OF COLON 03/28/2009  . FATTY LIVER DISEASE 03/28/2009  . BRADYCARDIA 03/20/2009  . DIZZINESS 10/17/2008  . CHEST PAIN 10/17/2008  . Malignant neoplasm of bladder ([place]) 02/07/2008  . Allergic rhinitis 02/07/2008  . COLONIC POLYPS, HX OF 02/07/2008  . Hyperlipidemia 11/24/2007  . Essential hypertension 11/24/2007  . GERD 11/24/2007  . HIATAL HERNIA WITH REFLUX 11/24/2007  . RENAL INSUFFICIENCY 11/24/2007  . Rheumatoid arthritis ([place]) 11/24/2007  . [place] DISEASE, LUMBAR 11/24/2007  . ADENOIDECTOMY, HX OF 11/24/2007    Past Surgical History:  Procedure Laterality Date  . CARDIAC CATHETERIZATION  02-12-2007  dr gregg taylor   Non-obstructive CAD,  20% pLCX,  20% LAD, ef 65%  . CARDIAC CATHETERIZATION  03-14-2009  dr Johnsie Cancel   pLAD and mid diaginal 20-30% multiple lesions,  OM1 20%, right renal ostial 40-50%,  ef 60%  . CATARACT EXTRACTION W/ INTRAOCULAR  LENS  IMPLANT, BILATERAL  2016  . CYSTOSCOPY WITH BIOPSY N/A 07/11/2015   Procedure: CYSTOSCOPY WITH COLD CUP RESECTION AND FULGERATION;  Surgeon: Rana Snare, MD;  [facility];  Service: Urology;  Laterality: N/A;  . HIP ARTHROSCOPY W/ LABRAL DEBRIDEMENT Left 06-29-2000   and chondraplasty  . LUMBAR [place] SURGERY  07-16-2005  . LUMBAR LAMINECTOMY/DECOMPRESSION MICRODISCECTOMY  07/09/2011   Procedure: LUMBAR LAMINECTOMY/DECOMPRESSION MICRODISCECTOMY;  Surgeon: Johnn Hai;  Location: [facility];  Service: Orthopedics;  Laterality: Left;  Decompression L5 - S1 on the Left and repair of dura  . LUMBAR LAMINECTOMY/DECOMPRESSION MICRODISCECTOMY N/A 07/27/2013   Procedure: DECOMPRESSION L4 - L5 WITH EXCISION OF SYNOVIAL CYST AND, LATERAL MASS FUSION 1 LEVEL;  Surgeon: Johnn Hai, MD;  Location: [facility];  Service: Orthopedics;  Laterality: N/A;  . LUMBAR LAMINECTOMY/DECOMPRESSION MICRODISCECTOMY Left 07/06/2014  Procedure:  MICRO LUMBAR DECOMPRESSION L5-S1 ON LEFT, L4-5 REDO;  Surgeon: Johnn Hai, MD;  Location: [facility];  Service: Orthopedics;  Laterality: Left;  . SPINAL CORD STIMULATOR INSERTION N/A 09/11/2016   Procedure: Spinal cord stimulator placement;  Surgeon: Melina Schools, MD;  Location: [facility];  Service: Orthopedics;  Laterality: N/A;  . TONSILLECTOMY AND ADENOIDECTOMY    . TRANSTHORACIC ECHOCARDIOGRAM  09-20-2010   normal LVF, ef 55-65%/  mild MR, TR and PR/  mild LAE  . TRANSURETHRAL RESECTION OF BLADDER TUMOR  09-21-2002        Home Medications    Prior to Admission medications   Medication Sig Start Date End Date Taking? Authorizing Provider  acetaminophen (TYLENOL) 650 MG CR tablet Take 1,300 mg by mouth at bedtime as needed for pain.    [provider]  albuterol (PROVENTIL HFA;VENTOLIN HFA) 108 (90 Base) MCG/ACT inhaler Inhale 2 puffs into the lungs every 6 (six) hours as needed for wheezing or shortness of breath. 11/18/17   Biagio Borg,  MD  amLODipine (NORVASC) 10 MG tablet TAKE 1 TABLET (10 MG TOTAL) BY MOUTH DAILY. 10/18/18   Biagio Borg, MD  Ascorbic Acid (VITAMIN C) 1000 MG tablet Take 1,000 mg by mouth daily.    [provider]  brimonidine (ALPHAGAN) 0.15 % ophthalmic solution Place 1 drop into both eyes 2 (two) times daily.    [provider]  citalopram (CELEXA) 10 MG tablet TAKE 1 TABLET EVERY MORNING 01/31/19   Biagio Borg, MD  clonazePAM (KLONOPIN) 0.5 MG tablet clonazepam 0.5 mg tablet    [provider]  clotrimazole (LOTRIMIN) 1 % cream Apply 1 application topically 2 (two) times daily as needed (FOR RASH). Prescribed for rash in groin    [provider]  diazepam (VALIUM) 5 MG tablet as needed. 11/18/18   [provider]  fluticasone Asencion Islam) 50 MCG/ACT nasal spray USE 2 SPRAYS IN [facility] NOSTRIL EVERY DAY 11/08/18   Biagio Borg, MD  gabapentin (NEURONTIN) 300 MG capsule TAKE 1 TO 2 CAPSULES FOUR TIMES DAILY  AFTER MEALS AND AT BEDTIME. 03/12/18   Biagio Borg, MD  hydrALAZINE (APRESOLINE) 10 MG tablet TAKE 1 TABLET THREE TIMES DAILY AS NEEDED FOR BLOOD PRESSURE MORE THAN 150/90 01/19/19   Biagio Borg, MD  hydrochlorothiazide (HYDRODIURIL) 25 MG tablet TAKE 0.5 TABLETS (12.5 MG TOTAL) BY MOUTH DAILY. 12/07/18   Biagio Borg, MD  hydroxypropyl methylcellulose / hypromellose (ISOPTO TEARS / GONIOVISC) 2.5 % ophthalmic solution Place 1 drop into both eyes 2 (two) times daily.    [provider]  losartan (COZAAR) 100 MG tablet TAKE 1 TABLET EVERY DAY 12/09/18   Biagio Borg, MD  meclizine (ANTIVERT) 25 MG tablet TAKE 1 TABLET TWICE DAILY AS NEEDED FOR DIZZINESS 12/09/18   Biagio Borg, MD  methocarbamol (ROBAXIN) 500 MG tablet methocarbamol 500 mg tablet  Take 1 tablet 3 times a day by oral route as needed.    [provider]  nitroGLYCERIN (NITROSTAT) 0.4 MG SL tablet PLACE 1 TABLET UNDER THE TONGUE EVERY 5 MINUTES AS NEEDED FOR CHEST PAIN. MAX 3 DOSES. CALL  911 IF NO IMPROVEMENT AFTER 1ST DOSE Patient taking differently: Place 0.4 mg under the tongue every 5 (five) minutes as needed for chest pain.  02/01/18   Biagio Borg, MD  Nutritional Supplements (JUICE PLUS FIBRE PO) Take 1 each 2 (two) times daily by mouth.    [provider]  omeprazole (PRILOSEC) 40 MG capsule TAKE 1 CAPSULE TWICE DAILY 04/20/18   Irene Shipper, MD  ondansetron (ZOFRAN) 4 MG tablet Take 1 tablet (4 mg total) by mouth every 8 (eight) hours as needed for nausea or vomiting. 12/24/18   Biagio Borg, MD  potassium chloride (K-DUR) 10 MEQ tablet TAKE 2 TABLETS EVERY DAY 12/07/18   Biagio Borg, MD  primidone (MYSOLINE) 250 MG tablet TAKE 1/2 TABLET THREE TIMES DAILY 01/24/19   Biagio Borg, MD  psyllium (METAMUCIL SMOOTH TEXTURE) 58.6 % powder Take 2 tablespoons daily. Drink 12 oz of water 02/25/18   Irene Shipper, MD  simvastatin (ZOCOR) 40 MG tablet TAKE 1 AND 1/2 TABLETS AT BEDTIME 10/04/18   Biagio Borg, MD  tamsulosin (FLOMAX) 0.4 MG CAPS capsule Take 1 capsule (0.4 mg total) by mouth daily. Patient taking differently: Take 0.8 mg by mouth at bedtime.  05/17/15   Biagio Borg, MD  timolol (TIMOPTIC) 0.5 % ophthalmic solution Place 1 drop into both eyes 2 (two) times daily.  05/30/15   [provider]  Travoprost, BAK Free, (TRAVATAN Z) 0.004 % SOLN ophthalmic solution Place 1 drop into both eyes at bedtime. 09/14/13   Biagio Borg, MD  triamcinolone (KENALOG) 0.025 % cream Apply 1 application topically 2 (two) times daily as needed (rash).     [provider]    Family History Family History  Problem Relation Age of Onset  . Heart attack Mother   . Cancer Father        lung  . Cancer Sister        lung  . Diabetes Sister   . Cancer Brother        lung that spread to brain    Social History Social History   Tobacco Use  . Smoking status: Former Smoker    Packs/day: 2.00    Years: 20.00    Pack years: 40.00    Types: Cigarettes     Quit date: 10/08/1982    Years since quitting: 36.3  . Smokeless tobacco: Never Used  Substance Use Topics  . Alcohol use: Yes    Comment: rare wine or beer occassionally  . Drug use: No     Allergies   Morphine and related, Hydrochlorothiazide, Tape, Sulfur dioxide, Latex, Lisinopril, and Sulfa antibiotics   Review of Systems Review of Systems  Respiratory: Negative for shortness of breath.   Cardiovascular: Positive for chest pain.  Neurological: Negative for headaches.  All other systems reviewed and are negative.    Physical Exam Updated Vital Signs BP 105/68 (BP Location: Left Arm)   Pulse 62   Temp 97.9 F (36.6 C) (Oral)   Resp 14   Wt 90 kg   SpO2 94%   BMI 31.08 kg/m   Physical Exam Vitals signs and nursing note reviewed.  Constitutional:      General: He is not in acute distress.    Appearance: He is well-developed and normal weight.  HENT:     Head: Normocephalic and atraumatic.  Eyes:     Conjunctiva/sclera: Conjunctivae normal.     Pupils: Pupils are equal, round, and reactive to light.  Neck:     Musculoskeletal: Normal range of motion and neck supple. Normal range of motion. No spinous process tenderness or muscular tenderness.  Cardiovascular:     Rate and Rhythm: Normal rate and regular rhythm.     Pulses: Normal pulses.     Heart sounds: No  murmur.  Pulmonary:     Effort: Pulmonary effort is normal. No respiratory distress.     Breath sounds: No wheezing or rales.     Comments: Decreased breath sounds throughout related to effort Chest:     Chest wall: Tenderness present. No crepitus.    Abdominal:     General: There is no distension.     Palpations: Abdomen is soft.     Tenderness: There is no abdominal tenderness. There is no right CVA tenderness, left CVA tenderness, guarding or rebound.  Musculoskeletal:        General: Tenderness present.     Right shoulder: Normal.     Left shoulder: He exhibits decreased range of motion,  tenderness, bony tenderness and pain. He exhibits no deformity, normal pulse and normal strength.     Left elbow: Normal.     Right hip: Normal.     Left hip: Normal.     Left knee: Normal.       Arms:  Skin:    General: Skin is warm and dry.     Findings: No erythema or rash.  Neurological:     General: No focal deficit present.     Mental Status: He is alert and oriented to person, place, and time. Mental status is at baseline.  Psychiatric:        Mood and Affect: Mood normal.        Behavior: Behavior normal.        Thought Content: Thought content normal.      ED Treatments / Results  Labs (all labs ordered are listed, but only abnormal results are displayed) Labs Reviewed - No data to display  EKG EKG Interpretation  Date/Time:  Tuesday February 15 2019 10:51:15 EDT Ventricular Rate:  54 PR Interval:    QRS Duration: 106 QT Interval:  416 QTC Calculation: 395 R Axis:   30 Text Interpretation:  Sinus rhythm Artifact in lead(s) II III aVR aVL aVF V3 V4 V5 V6 No significant change since last tracing Confirmed by Blanchie Dessert [phone]) on 02/15/2019 11:23:27 AM   Radiology Dg Ribs Unilateral W/chest Left  Result Date: 02/15/2019 CLINICAL DATA:  [place] out of bed last evening.  Left rib pain. EXAM: LEFT RIBS AND CHEST - 3+ VIEW COMPARISON:  Chest x-ray 09/03/2017 FINDINGS: The cardiac silhouette, mediastinal and hilar contours are within normal limits and stable. There is mild tortuosity and calcification of the thoracic aorta. Low lung volumes with vascular crowding and bibasilar atelectasis. A spinal cord stimulator is noted in the midthoracic region. Dedicated left rib films do not demonstrate any definite acute rib fractures. IMPRESSION: Low lung volumes with vascular crowding and bibasilar atelectasis but no definite pleural effusions or pneumothorax. No definite acute left rib fractures. Electronically Signed   By: Marijo Sanes M.D.   On: 02/15/2019 11:00   Dg Shoulder  Left  Result Date: 02/15/2019 CLINICAL DATA:  [place] out of bed last evening. Left rib and shoulder pain. EXAM: LEFT SHOULDER - 2+ VIEW COMPARISON:  None. FINDINGS: [facility] and glenohumeral joints are maintained. No acute fractures identified. The upper left ribs are intact and the left lung apex is clear. IMPRESSION: No fracture or dislocation. Electronically Signed   By: Marijo Sanes M.D.   On: 02/15/2019 10:57    Procedures Procedures (including critical care time)  Medications Ordered in ED Medications - No data to display   Initial Impression / Assessment and Plan / ED Course  I have reviewed  the triage vital signs and the nursing notes.  Pertinent labs & imaging results that were available during my care of the patient were reviewed by me and considered in my medical decision making (see chart for details).        Elderly male with multiple medical problems presenting today after falling out of bed and having persistent left-sided chest pain and shoulder pain.  Patient is satting 94% on room air and does appear to be uncomfortable.  He has point tenderness in the left lateral chest but breath sounds are equal but diminished.  Discern for rib fracture but lower concern for pneumothorax.  Low suspicion that this is ACS or cardiac in nature.  Patient also having shoulder pain but no evidence of deformity with low suspicion for dislocation or humeral head fracture.  X-rays of chest and shoulder are pending.  Patient has taken Tylenol but states he may need something stronger for home.  11:23 AM Patient's x-rays are negative for acute fracture.  When he was on his way to x-ray and was standing he developed severe pain and had a moment where he vagal then felt dizzy.  Blood pressure did drop to the 80s but then resolved with laying down.  Patient had orthostatics done here that were normal and EKG without acute findings.  On repeat evaluation patient states he feels fine he just thought it was  related to the the bad pain he had.  Findings discussed with the patient and he will be given lidocaine patches and a short prescription for some Vicodin. Patient is comfortable with this plan and if symptoms continue will follow-up with orthopedics. Final Clinical Impressions(s) / ED Diagnoses   Final diagnoses:  Fall, initial encounter  Contusion of left chest wall, initial encounter  Contusion of left shoulder, initial encounter  Contusion of face, initial encounter    ED Discharge Orders         Ordered    lidocaine (LIDODERM) 5 %  Every 24 hours     02/15/19 1126    HYDROcodone-acetaminophen (NORCO/VICODIN) 5-325 MG tablet  Every 6 hours PRN     02/15/19 1126           Blanchie Dessert, MD 02/15/19 1126

## 2019-02-15 NOTE — ED Notes (Signed)
Verbalized understanding discharge instructions, prescriptions, and follow-up. In no acute distress.   

## 2019-02-24 ENCOUNTER — Emergency Department (HOSPITAL_COMMUNITY)
Admission: EM | Admit: 2019-02-24 | Discharge: 2019-02-24 | Disposition: A | Discharge status: Home / Self Care | Payer: Medicare Other | Attending: Emergency Medicine | Admitting: Emergency Medicine

## 2019-02-24 ENCOUNTER — Ambulatory Visit: Payer: Self-pay

## 2019-02-24 ENCOUNTER — Other Ambulatory Visit: Payer: Self-pay

## 2019-02-24 ENCOUNTER — Emergency Department (HOSPITAL_COMMUNITY): Payer: Medicare Other

## 2019-02-24 ENCOUNTER — Encounter (HOSPITAL_COMMUNITY): Payer: Self-pay

## 2019-02-24 DIAGNOSIS — S20212D Contusion of left front wall of thorax, subsequent encounter: Secondary | ICD-10-CM | POA: Diagnosis not present

## 2019-02-24 DIAGNOSIS — Z91040 Latex allergy status: Secondary | ICD-10-CM | POA: Insufficient documentation

## 2019-02-24 DIAGNOSIS — I2510 Atherosclerotic heart disease of native coronary artery without angina pectoris: Secondary | ICD-10-CM | POA: Insufficient documentation

## 2019-02-24 DIAGNOSIS — W06XXXD Fall from bed, subsequent encounter: Secondary | ICD-10-CM | POA: Insufficient documentation

## 2019-02-24 DIAGNOSIS — I110 Hypertensive heart disease with heart failure: Secondary | ICD-10-CM | POA: Insufficient documentation

## 2019-02-24 DIAGNOSIS — I509 Heart failure, unspecified: Secondary | ICD-10-CM | POA: Diagnosis not present

## 2019-02-24 DIAGNOSIS — R0789 Other chest pain: Secondary | ICD-10-CM | POA: Diagnosis present

## 2019-02-24 DIAGNOSIS — Z79899 Other long term (current) drug therapy: Secondary | ICD-10-CM | POA: Insufficient documentation

## 2019-02-24 DIAGNOSIS — R0602 Shortness of breath: Secondary | ICD-10-CM | POA: Diagnosis not present

## 2019-02-24 DIAGNOSIS — Z87891 Personal history of nicotine dependence: Secondary | ICD-10-CM | POA: Insufficient documentation

## 2019-02-24 NOTE — Discharge Instructions (Addendum)
Please continue your lidocaine patches and pain medicine Please follow-up with your physician next week

## 2019-02-24 NOTE — ED Provider Notes (Signed)
[facility] DEPT Provider Note   CSN: [id number] Arrival date & time: 02/24/19  1027     History   Chief Complaint Chief Complaint  Patient presents with   Fall    HPI [name] is a 83 y.o. male.     HPI  83 year old male presents today stating that he fell on Monday evening.  He rolled out of bed and landed on his left upper lateral chest wall.  He was seen here and had rib x-Navil Kole and shoulder x-Jayonna Meyering.  He was told it was normal.  He was discharged on pain medication.  He states he is used a lidocaine patch with some relief.  However, he is having ongoing pain in this area and it is moderate to severe.  He feels like it is painful when he is trying to take a deep breath.  Secondary to this he is somewhat short of breath.  He denies striking his head, neck pain, or other extremity pain or injury.  He denies being on any blood thinners.  Past Medical History:  Diagnosis Date   Acute bronchitis per pt no fever--  cough since discharge from hospital 07-02-2015   per pcp note 07-05-2015  mild to moderate bronchitis vs pna   Allergic rhinitis    Bladder cancer (HCC)    BPH (benign prostatic hyperplasia)    Bradycardia    CAD (coronary artery disease)    Chronic low back pain    DDD (degenerative disc disease), lumbosacral    Depression    Diverticulosis of colon (without mention of hemorrhage)    Dyspnea    Essential tremor    Fatty liver disease, nonalcoholic    GERD (gastroesophageal reflux disease)    Glaucoma    History of bladder cancer    2004--  TCC low-grade , non-invasive   History of colon polyps    History of exercise stress test    02-05-2007-- ETT (Clinically positive, electrically equivocal, submaximal ETT,  appropriate bp response to exercise   History of hiatal hernia    History of kidney stones    History of peptic ulcer    HLD (hyperlipidemia)    HTN (hypertension)    Nocturia    Productive cough     Rheumatoid arthritis(714.0)    Right renal artery stenosis ([place])    per cath 03-14-2009 --- 40-50%   RLS (restless legs syndrome)    Vertigo, intermittent    Weak urinary stream    Wears dentures    upper   Wears glasses    Wears hearing aid    bilateral   Wears partial dentures    lower    Patient Active Problem List   Diagnosis Date Noted   Joint swelling of lower leg 11/29/2018   Abnormal urinalysis 02/25/2018   Anxiety 01/08/2018   Laceration of hand 12/18/2017   Pain of left hand 12/18/2017   Laceration of scalp without foreign body 06/09/2017   Nonintractable headache 06/09/2017   Acute sinus infection 04/14/2017   Nausea and vomiting 03/27/2017   Hyponatremia 03/17/2017   Right lumbar radiculopathy 01/21/2017   Fall 01/21/2017   Synovial cyst 01/21/2017   Chronic pain 09/11/2016   Generalized weakness 05/22/2016   UTI (urinary tract infection) 05/22/2016   Weakness 05/22/2016   Fever blister 10/18/2015   Dyspnea 10/17/2015   Bradycardia 09/17/2015   Chest pain at rest 09/17/2015   CAD in native artery 09/17/2015   CHF (congestive heart failure) ([place])  09/17/2015   Pulmonary hypertension ([place]) 09/17/2015   Bladder cancer ([place]) 09/17/2015   Essential tremor 09/17/2015   Chest pain 09/17/2015   Anginal chest pain at rest [facility]) 09/17/2015   Hypokalemia 07/13/2015   Vertigo 07/01/2015   Dizzinesses 07/01/2015   Spinal stenosis of lumbar region at multiple levels 07/06/2014   Angular cheilitis 05/05/2014   Left lumbar radiculopathy 05/04/2014   Right elbow pain 12/29/2013   Lateral epicondylitis of right elbow 12/29/2013   Cough 09/14/2013   Spinal stenosis, lumbar 07/27/2013   Palpable mass of neck 07/21/2013   Vasovagal episode 01/10/2013   Left otitis media 10/28/2012   Impaired glucose tolerance 10/28/2012   Cellulitis 04/24/2012   Hemorrhoids 04/24/2012   Syncope 04/21/2012   Preventative  health care 11/28/2010   Depression 09/12/2010   RENAL ARTERY STENOSIS 09/12/2010   FATIGUE 09/12/2010   BUNION, RIGHT FOOT 02/05/2010   RECTAL BLEEDING 07/10/2009   Diarrhea 07/10/2009   DIVERTICULOSIS OF COLON 03/28/2009   FATTY LIVER DISEASE 03/28/2009   BRADYCARDIA 03/20/2009   DIZZINESS 10/17/2008   CHEST PAIN 10/17/2008   Malignant neoplasm of bladder ([place]) 02/07/2008   Allergic rhinitis 02/07/2008   COLONIC POLYPS, HX OF 02/07/2008   Hyperlipidemia 11/24/2007   Essential hypertension 11/24/2007   GERD 11/24/2007   HIATAL HERNIA WITH REFLUX 11/24/2007   RENAL INSUFFICIENCY 11/24/2007   Rheumatoid arthritis ([place]) 11/24/2007   [place] DISEASE, LUMBAR 11/24/2007   ADENOIDECTOMY, HX OF 11/24/2007    Past Surgical History:  Procedure Laterality Date   CARDIAC CATHETERIZATION  02-12-2007  dr gregg taylor   Non-obstructive CAD,  20% pLCX,  20% LAD, ef 65%   CARDIAC CATHETERIZATION  03-14-2009  dr Johnsie Cancel   pLAD and mid diaginal 20-30% multiple lesions,  OM1 20%, right renal ostial 40-50%,  ef 60%   CATARACT EXTRACTION W/ INTRAOCULAR LENS  IMPLANT, BILATERAL  2016   CYSTOSCOPY WITH BIOPSY N/A 07/11/2015   Procedure: CYSTOSCOPY WITH COLD CUP RESECTION AND FULGERATION;  Surgeon: Rana Snare, MD;  Location: [facility];  Service: Urology;  Laterality: N/A;   HIP ARTHROSCOPY W/ LABRAL DEBRIDEMENT Left 06-29-2000   and chondraplasty   LUMBAR DISC SURGERY  07-16-2005   LUMBAR LAMINECTOMY/DECOMPRESSION MICRODISCECTOMY  07/09/2011   Procedure: LUMBAR LAMINECTOMY/DECOMPRESSION MICRODISCECTOMY;  Surgeon: Johnn Hai;  Location: [facility];  Service: Orthopedics;  Laterality: Left;  Decompression L5 - S1 on the Left and repair of dura   LUMBAR LAMINECTOMY/DECOMPRESSION MICRODISCECTOMY N/A 07/27/2013   Procedure: DECOMPRESSION L4 - L5 WITH EXCISION OF SYNOVIAL CYST AND, LATERAL MASS FUSION 1 LEVEL;  Surgeon: Johnn Hai, MD;  Location: [facility];   Service: Orthopedics;  Laterality: N/A;   LUMBAR LAMINECTOMY/DECOMPRESSION MICRODISCECTOMY Left 07/06/2014   Procedure:  MICRO LUMBAR DECOMPRESSION L5-S1 ON LEFT, L4-5 REDO;  Surgeon: Johnn Hai, MD;  Location: [facility];  Service: Orthopedics;  Laterality: Left;   SPINAL CORD STIMULATOR INSERTION N/A 09/11/2016   Procedure: Spinal cord stimulator placement;  Surgeon: Melina Schools, MD;  Location: [facility];  Service: Orthopedics;  Laterality: N/A;   TONSILLECTOMY AND ADENOIDECTOMY     TRANSTHORACIC ECHOCARDIOGRAM  09-20-2010   normal LVF, ef 55-65%/  mild MR, TR and PR/  mild LAE   TRANSURETHRAL RESECTION OF BLADDER TUMOR  09-21-2002        Home Medications    Prior to Admission medications   Medication Sig Start Date End Date Taking? Authorizing Provider  acetaminophen (TYLENOL) 650 MG CR tablet Take 1,300 mg by mouth at  bedtime as needed for pain.    [provider]  albuterol (PROVENTIL HFA;VENTOLIN HFA) 108 (90 Base) MCG/ACT inhaler Inhale 2 puffs into the lungs every 6 (six) hours as needed for wheezing or shortness of breath. 11/18/17   Biagio Borg, MD  amLODipine (NORVASC) 10 MG tablet TAKE 1 TABLET (10 MG TOTAL) BY MOUTH DAILY. 10/18/18   Biagio Borg, MD  Ascorbic Acid (VITAMIN C) 1000 MG tablet Take 1,000 mg by mouth daily.    [provider]  brimonidine (ALPHAGAN) 0.15 % ophthalmic solution Place 1 drop into both eyes 2 (two) times daily.    [provider]  citalopram (CELEXA) 10 MG tablet TAKE 1 TABLET EVERY MORNING 01/31/19   Biagio Borg, MD  clonazePAM (KLONOPIN) 0.5 MG tablet clonazepam 0.5 mg tablet    [provider]  clotrimazole (LOTRIMIN) 1 % cream Apply 1 application topically 2 (two) times daily as needed (FOR RASH). Prescribed for rash in groin    [provider]  diazepam (VALIUM) 5 MG tablet as needed. 11/18/18   [provider]  fluticasone Asencion Islam) 50 MCG/ACT nasal spray USE 2 SPRAYS IN [facility] NOSTRIL EVERY  DAY 11/08/18   Biagio Borg, MD  gabapentin (NEURONTIN) 300 MG capsule TAKE 1 TO 2 CAPSULES FOUR TIMES DAILY  AFTER MEALS AND AT BEDTIME. 03/12/18   Biagio Borg, MD  hydrALAZINE (APRESOLINE) 10 MG tablet TAKE 1 TABLET THREE TIMES DAILY AS NEEDED FOR BLOOD PRESSURE MORE THAN 150/90 01/19/19   Biagio Borg, MD  hydrochlorothiazide (HYDRODIURIL) 25 MG tablet TAKE 0.5 TABLETS (12.5 MG TOTAL) BY MOUTH DAILY. 12/07/18   Biagio Borg, MD  HYDROcodone-acetaminophen (NORCO/VICODIN) 5-325 MG tablet Take 1 tablet by mouth every 6 (six) hours as needed for severe pain. 02/15/19   Blanchie Dessert, MD  hydroxypropyl methylcellulose / hypromellose (ISOPTO TEARS / GONIOVISC) 2.5 % ophthalmic solution Place 1 drop into both eyes 2 (two) times daily.    [provider]  lidocaine (LIDODERM) 5 % Place 1 patch onto the skin daily. Remove & Discard patch within 12 hours or as directed by MD 02/15/19   Blanchie Dessert, MD  losartan (COZAAR) 100 MG tablet TAKE 1 TABLET EVERY DAY 12/09/18   Biagio Borg, MD  meclizine (ANTIVERT) 25 MG tablet TAKE 1 TABLET TWICE DAILY AS NEEDED FOR DIZZINESS 12/09/18   Biagio Borg, MD  methocarbamol (ROBAXIN) 500 MG tablet methocarbamol 500 mg tablet  Take 1 tablet 3 times a day by oral route as needed.    [provider]  nitroGLYCERIN (NITROSTAT) 0.4 MG SL tablet PLACE 1 TABLET UNDER THE TONGUE EVERY 5 MINUTES AS NEEDED FOR CHEST PAIN. MAX 3 DOSES. CALL 911 IF NO IMPROVEMENT AFTER 1ST DOSE Patient taking differently: Place 0.4 mg under the tongue every 5 (five) minutes as needed for chest pain.  02/01/18   Biagio Borg, MD  Nutritional Supplements (JUICE PLUS FIBRE PO) Take 1 each 2 (two) times daily by mouth.    [provider]  omeprazole (PRILOSEC) 40 MG capsule TAKE 1 CAPSULE TWICE DAILY 04/20/18   Irene Shipper, MD  ondansetron (ZOFRAN) 4 MG tablet Take 1 tablet (4 mg total) by mouth every 8 (eight) hours as needed for nausea or vomiting. 12/24/18   Biagio Borg, MD  potassium chloride (K-DUR) 10 MEQ tablet TAKE 2 TABLETS EVERY DAY 12/07/18   Biagio Borg, MD  primidone (MYSOLINE) 250 MG tablet TAKE 1/2 TABLET THREE  TIMES DAILY 01/24/19   Biagio Borg, MD  psyllium (METAMUCIL SMOOTH TEXTURE) 58.6 % powder Take 2 tablespoons daily. Drink 12 oz of water 02/25/18   Irene Shipper, MD  simvastatin (ZOCOR) 40 MG tablet TAKE 1 AND 1/2 TABLETS AT BEDTIME 10/04/18   Biagio Borg, MD  tamsulosin (FLOMAX) 0.4 MG CAPS capsule Take 1 capsule (0.4 mg total) by mouth daily. Patient taking differently: Take 0.8 mg by mouth at bedtime.  05/17/15   Biagio Borg, MD  timolol (TIMOPTIC) 0.5 % ophthalmic solution Place 1 drop into both eyes 2 (two) times daily.  05/30/15   [provider]  Travoprost, BAK Free, (TRAVATAN Z) 0.004 % SOLN ophthalmic solution Place 1 drop into both eyes at bedtime. 09/14/13   Biagio Borg, MD  triamcinolone (KENALOG) 0.025 % cream Apply 1 application topically 2 (two) times daily as needed (rash).     [provider]    Family History Family History  Problem Relation Age of Onset   Heart attack Mother    Cancer Father        lung   Cancer Sister        lung   Diabetes Sister    Cancer Brother        lung that spread to brain    Social History Social History   Tobacco Use   Smoking status: Former Smoker    Packs/day: 2.00    Years: 20.00    Pack years: 40.00    Types: Cigarettes    Quit date: 10/08/1982    Years since quitting: 36.4   Smokeless tobacco: Never Used  Substance Use Topics   Alcohol use: Yes    Comment: rare wine or beer occassionally   Drug use: No     Allergies   Morphine and related, Hydrochlorothiazide, Tape, Sulfur dioxide, Latex, Lisinopril, and Sulfa antibiotics   Review of Systems Review of Systems  All other systems reviewed and are negative.    Physical Exam Updated Vital Signs BP (!) 151/67    Pulse 66    Temp 98.3 F (36.8 C) (Oral)    Resp 16    Wt 90  kg    SpO2 97%    BMI 31.08 kg/m   Physical Exam Vitals signs and nursing note reviewed.  Constitutional:      Appearance: Normal appearance.  HENT:     Head: Normocephalic.     Right Ear: External ear normal.     Left Ear: External ear normal.     Nose: Nose normal.     Mouth/Throat:     Mouth: Mucous membranes are moist.  Eyes:     Pupils: Pupils are equal, round, and reactive to light.  Neck:     Musculoskeletal: Normal range of motion.  Cardiovascular:     Rate and Rhythm: Normal rate and regular rhythm.     Pulses: Normal pulses.     Heart sounds: Normal heart sounds.  Pulmonary:     Effort: Pulmonary effort is normal.     Breath sounds: Normal breath sounds.    Neurological:     Mental Status: He is alert.      ED Treatments / Results  Labs (all labs ordered are listed, but only abnormal results are displayed) Labs Reviewed - No data to display  EKG None  Radiology Ct Chest Wo Contrast  Result Date: 02/24/2019 CLINICAL DATA:  Shortness of breath, chest pain after fall. EXAM: CT CHEST WITHOUT  CONTRAST TECHNIQUE: Multidetector CT imaging of the chest was performed following the standard protocol without IV contrast. COMPARISON:  Radiographs of September 04, 2017. FINDINGS: Cardiovascular: Atherosclerosis of thoracic aorta is noted without aneurysm formation. Coronary artery calcifications are noted. Normal cardiac size. No pericardial effusion. Mediastinum/Nodes: No enlarged mediastinal or axillary lymph nodes. Thyroid gland, trachea, and esophagus demonstrate no significant findings. Lungs/Pleura: No pneumothorax is noted. Minimal left pleural effusion is noted. Emphysematous disease is noted in both upper lobes. No significant consolidative process is noted. Scattered small calcifications are noted peripherally throughout both lungs most consistent with sequela of prior inflammation or infection. Upper Abdomen: No acute abnormality. Musculoskeletal: No chest wall mass  or suspicious bone lesions identified. IMPRESSION: No acute pulmonary consolidation is noted. Scattered small calcifications are seen throughout both lungs most consistent with sequela of prior infection or inflammation. Minimal left pleural effusion is noted. Coronary artery calcifications are noted. Aortic Atherosclerosis (ICD10-I70.0) and Emphysema (ICD10-J43.9). Electronically Signed   By: Marijo Conception M.D.   On: 02/24/2019 11:48    Procedures Procedures (including critical care time)  Medications Ordered in ED Medications - No data to display   Initial Impression / Assessment and Plan / ED Course  I have reviewed the triage vital signs and the nursing notes.  Pertinent labs & imaging results that were available during my care of the patient were reviewed by me and considered in my medical decision making (see chart for details).       83 year old male who fell 3 days ago and presents today complaining of ongoing left-sided chest wall pain.  CT done here to evaluate for rib fracture or underlying lung injury and is negative.  Patient appears hemodynamically stable is discharged home  Final Clinical Impressions(s) / ED Diagnoses   Final diagnoses:  Contusion of left chest wall, subsequent encounter    ED Discharge Orders    None       Pattricia Boss, MD 02/24/19 1253

## 2019-02-24 NOTE — ED Triage Notes (Signed)
Pt states that he fell on the 4th. Pt states that since then, he has continued to have left shoulder, chest and back pain.

## 2019-02-24 NOTE — Telephone Encounter (Signed)
Noted  

## 2019-02-24 NOTE — Telephone Encounter (Signed)
Pt. Called to report increased shortness of breath and pain in left ant. Chest and left shoulder blade in past 24 hrs.  Reported he fell out of bed and hit his left chest on a trash can on 8/4.  Was initially evaluated in the ER, and fractured ribs and shoulder were ruled out.  Stated the left shoulder blade started hurting yesterday; rated the pain 8/10 and constant.  Rated left anterior chest pain at 6/10 and constant.  Reported it is more painful to take a deep breath.  Also stated his breathing has become more short since yesterday with onset of pain in shoulder blade.  C/o wheezing.  Advised he should return to ER for evaluation at this time.  Encouraged to have someone drive him; reported he does not have anyone to drive him.  Advised to call EMS if his pain or breathing becomes worse, before he proceeds to ER.  Verb. Understanding.     Reason for Disposition . [1] Difficulty breathing AND [2] not severe  Answer Assessment - Initial Assessment Questions 1. MECHANISM: "How did the injury happen?"     Rolled off the bed and hit the trash can 2. ONSET: "When did the injury happen?" (Minutes or hours ago)     02/15/19 3. LOCATION: "Where on the chest is the injury located?"     Left chest in front 4. APPEARANCE: "What does the injury look like?"    No swelling or redness; has a small bruise under the left arm 5. BLEEDING: "Is there any bleeding now? If so, ask: How long has it been bleeding?"     denied 6. SEVERITY: "Any difficulty with breathing?"     Moderate with activity and at rest; c/o increased wheezing and SOB since shoulder pain started on 8/12  7. SIZE: For cuts, bruises, or swelling, ask: "How large is it?" (e.g., inches or centimeters)     Small bruise left side of chest; beneath the underarm  8. PAIN: "Is there pain?" If so, ask: "How bad is the pain?"   (e.g., Scale 1-10; or mild, moderate, severe)     Onset of pain in left shoulder blade yesterday; 8/10; left ant. chest pain at  6/10; hurts worse with deep breath 9. TETANUS: For any breaks in the skin, ask: "When was the last tetanus booster?"    N/a  10. PREGNANCY: "Is there any chance you are pregnant?" "When was your last menstrual period?"       N/a  Protocols used: CHEST INJURY-A-AH

## 2019-03-14 DIAGNOSIS — C672 Malignant neoplasm of lateral wall of bladder: Secondary | ICD-10-CM | POA: Diagnosis not present

## 2019-04-11 ENCOUNTER — Encounter: Payer: Self-pay | Admitting: Internal Medicine

## 2019-04-11 ENCOUNTER — Other Ambulatory Visit: Payer: Self-pay

## 2019-04-11 ENCOUNTER — Ambulatory Visit (INDEPENDENT_AMBULATORY_CARE_PROVIDER_SITE_OTHER): Payer: Medicare Other | Admitting: Internal Medicine

## 2019-04-11 DIAGNOSIS — I10 Essential (primary) hypertension: Secondary | ICD-10-CM | POA: Diagnosis not present

## 2019-04-11 DIAGNOSIS — R7302 Impaired glucose tolerance (oral): Secondary | ICD-10-CM

## 2019-04-11 DIAGNOSIS — S40812A Abrasion of left upper arm, initial encounter: Secondary | ICD-10-CM | POA: Insufficient documentation

## 2019-04-11 NOTE — Progress Notes (Signed)
Subjective:    Patient ID: [name], male    DOB: Oct 21, 1935, 83 y.o.   MRN: [id number]  HPI  Here at the behest of the family who asks him to have 2 abrasions near the left elbow checked due to slow healing it seems; started after a slip and fall 3 wks ago, has some blister like lesion at one point last wk, but now same area with mild scabbed.  No worsening redness, tender, swelling, drainage, or red streaks or fever.  Pt denies chest pain, increased sob or doe, wheezing, orthopnea, PND, increased LE swelling, palpitations, dizziness or syncope.  Pt denies new neurological symptoms such as new headache, or facial or extremity weakness or numbness   Pt denies polydipsia, polyuria Past Medical History:  Diagnosis Date  . Acute bronchitis per pt no fever--  cough since discharge from hospital 07-02-2015   per pcp note 07-05-2015  mild to moderate bronchitis vs pna  . Allergic rhinitis   . Bladder cancer ([place])   . BPH (benign prostatic hyperplasia)   . Bradycardia   . CAD (coronary artery disease)   . Chronic low back pain   . DDD (degenerative disc disease), lumbosacral   . Depression   . Diverticulosis of colon (without mention of hemorrhage)   . Dyspnea   . Essential tremor   . Fatty liver disease, nonalcoholic   . GERD (gastroesophageal reflux disease)   . Glaucoma   . History of bladder cancer    2004--  TCC low-grade , non-invasive  . History of colon polyps   . History of exercise stress test    02-05-2007-- ETT (Clinically positive, electrically equivocal, submaximal ETT,  appropriate bp response to exercise  . History of hiatal hernia   . History of kidney stones   . History of peptic ulcer   . HLD (hyperlipidemia)   . HTN (hypertension)   . Nocturia   . Productive cough   . Rheumatoid arthritis(714.0)   . Right renal artery stenosis (HCC)    per cath 03-14-2009 --- 40-50%  . RLS (restless legs syndrome)   . Vertigo, intermittent   . Weak urinary stream   . Wears  dentures    upper  . Wears glasses   . Wears hearing aid    bilateral  . Wears partial dentures    lower   Past Surgical History:  Procedure Laterality Date  . CARDIAC CATHETERIZATION  02-12-2007  dr gregg taylor   Non-obstructive CAD,  20% pLCX,  20% LAD, ef 65%  . CARDIAC CATHETERIZATION  03-14-2009  dr Johnsie Cancel   pLAD and mid diaginal 20-30% multiple lesions,  OM1 20%, right renal ostial 40-50%,  ef 60%  . CATARACT EXTRACTION W/ INTRAOCULAR LENS  IMPLANT, BILATERAL  2016  . CYSTOSCOPY WITH BIOPSY N/A 07/11/2015   Procedure: CYSTOSCOPY WITH COLD CUP RESECTION AND FULGERATION;  Surgeon: Rana Snare, MD;  Location: [facility];  Service: Urology;  Laterality: N/A;  . HIP ARTHROSCOPY W/ LABRAL DEBRIDEMENT Left 06-29-2000   and chondraplasty  . LUMBAR [place] SURGERY  07-16-2005  . LUMBAR LAMINECTOMY/DECOMPRESSION MICRODISCECTOMY  07/09/2011   Procedure: LUMBAR LAMINECTOMY/DECOMPRESSION MICRODISCECTOMY;  Surgeon: Johnn Hai;  Location: [facility];  Service: Orthopedics;  Laterality: Left;  Decompression L5 - S1 on the Left and repair of dura  . LUMBAR LAMINECTOMY/DECOMPRESSION MICRODISCECTOMY N/A 07/27/2013   Procedure: DECOMPRESSION L4 - L5 WITH EXCISION OF SYNOVIAL CYST AND, LATERAL MASS FUSION 1 LEVEL;  Surgeon: Johnn Hai,  MD;  Location: [facility];  Service: Orthopedics;  Laterality: N/A;  . LUMBAR LAMINECTOMY/DECOMPRESSION MICRODISCECTOMY Left 07/06/2014   Procedure:  MICRO LUMBAR DECOMPRESSION L5-S1 ON LEFT, L4-5 REDO;  Surgeon: Johnn Hai, MD;  Location: [facility];  Service: Orthopedics;  Laterality: Left;  . SPINAL CORD STIMULATOR INSERTION N/A 09/11/2016   Procedure: Spinal cord stimulator placement;  Surgeon: Melina Schools, MD;  Location: [facility];  Service: Orthopedics;  Laterality: N/A;  . TONSILLECTOMY AND ADENOIDECTOMY    . TRANSTHORACIC ECHOCARDIOGRAM  09-20-2010   normal LVF, ef 55-65%/  mild MR, TR and PR/  mild LAE  . TRANSURETHRAL RESECTION OF BLADDER TUMOR   09-21-2002    reports that he quit smoking about 36 years ago. His smoking use included cigarettes. He has a 40.00 pack-year smoking history. He has never used smokeless tobacco. He reports current alcohol use. He reports that he does not use drugs. family history includes Cancer in his brother, father, and sister; Diabetes in his sister; Heart attack in his mother. Allergies  Allergen Reactions  . Morphine And Related Other (See Comments)    Migraine   . Hydrochlorothiazide Other (See Comments)    REACTION: hyponatremia with daily use  . Tape Itching    Patient  Is ok to use paper tape  . Sulfur Dioxide   . Latex Rash  . Lisinopril Cough  . Sulfa Antibiotics Rash   Current Outpatient Medications on File Prior to Visit  Medication Sig Dispense Refill  . acetaminophen (TYLENOL) 650 MG CR tablet Take 1,300 mg by mouth at bedtime as needed for pain.    Marland Kitchen albuterol (PROVENTIL HFA;VENTOLIN HFA) 108 (90 Base) MCG/ACT inhaler Inhale 2 puffs into the lungs every 6 (six) hours as needed for wheezing or shortness of breath. 54 g 3  . amLODipine (NORVASC) 10 MG tablet TAKE 1 TABLET (10 MG TOTAL) BY MOUTH DAILY. 90 tablet 1  . Ascorbic Acid (VITAMIN C) 1000 MG tablet Take 1,000 mg by mouth daily.    . brimonidine (ALPHAGAN) 0.15 % ophthalmic solution Place 1 drop into both eyes 2 (two) times daily.    . citalopram (CELEXA) 10 MG tablet TAKE 1 TABLET EVERY MORNING 90 tablet 1  . clonazePAM (KLONOPIN) 0.5 MG tablet clonazepam 0.5 mg tablet    . clotrimazole (LOTRIMIN) 1 % cream Apply 1 application topically 2 (two) times daily as needed (FOR RASH). Prescribed for rash in groin    . diazepam (VALIUM) 5 MG tablet as needed.    . fluticasone (FLONASE) 50 MCG/ACT nasal spray USE 2 SPRAYS IN EACH NOSTRIL EVERY DAY 48 g 2  . gabapentin (NEURONTIN) 300 MG capsule TAKE 1 TO 2 CAPSULES FOUR TIMES DAILY  AFTER MEALS AND AT BEDTIME. 720 capsule 3  . hydrALAZINE (APRESOLINE) 10 MG tablet TAKE 1 TABLET THREE  TIMES DAILY AS NEEDED FOR BLOOD PRESSURE MORE THAN 150/90 270 tablet 1  . hydrochlorothiazide (HYDRODIURIL) 25 MG tablet TAKE 0.5 TABLETS (12.5 MG TOTAL) BY MOUTH DAILY. 45 tablet 2  . HYDROcodone-acetaminophen (NORCO/VICODIN) 5-325 MG tablet Take 1 tablet by mouth every 6 (six) hours as needed for severe pain. 10 tablet 0  . hydroxypropyl methylcellulose / hypromellose (ISOPTO TEARS / GONIOVISC) 2.5 % ophthalmic solution Place 1 drop into both eyes 2 (two) times daily.    Marland Kitchen lidocaine (LIDODERM) 5 % Place 1 patch onto the skin daily. Remove & Discard patch within 12 hours or as directed by MD 30 patch 0  . losartan (COZAAR) 100 MG  tablet TAKE 1 TABLET EVERY DAY 90 tablet 1  . meclizine (ANTIVERT) 25 MG tablet TAKE 1 TABLET TWICE DAILY AS NEEDED FOR DIZZINESS 180 tablet 1  . methocarbamol (ROBAXIN) 500 MG tablet methocarbamol 500 mg tablet  Take 1 tablet 3 times a day by oral route as needed.    . nitroGLYCERIN (NITROSTAT) 0.4 MG SL tablet PLACE 1 TABLET UNDER THE TONGUE EVERY 5 MINUTES AS NEEDED FOR CHEST PAIN. MAX 3 DOSES. CALL 911 IF NO IMPROVEMENT AFTER 1ST DOSE (Patient taking differently: Place 0.4 mg under the tongue every 5 (five) minutes as needed for chest pain. ) 25 tablet 0  . Nutritional Supplements (JUICE PLUS FIBRE PO) Take 1 each 2 (two) times daily by mouth.    Marland Kitchen omeprazole (PRILOSEC) 40 MG capsule TAKE 1 CAPSULE TWICE DAILY 180 capsule 3  . ondansetron (ZOFRAN) 4 MG tablet Take 1 tablet (4 mg total) by mouth every 8 (eight) hours as needed for nausea or vomiting. 30 tablet 2  . potassium chloride (K-DUR) 10 MEQ tablet TAKE 2 TABLETS EVERY DAY 180 tablet 1  . primidone (MYSOLINE) 250 MG tablet TAKE 1/2 TABLET THREE TIMES DAILY 135 tablet 0  . psyllium (METAMUCIL SMOOTH TEXTURE) 58.6 % powder Take 2 tablespoons daily. Drink 12 oz of water 283 g 12  . simvastatin (ZOCOR) 40 MG tablet TAKE 1 AND 1/2 TABLETS AT BEDTIME 135 tablet 1  . tamsulosin (FLOMAX) 0.4 MG CAPS capsule Take 1  capsule (0.4 mg total) by mouth daily. (Patient taking differently: Take 0.8 mg by mouth at bedtime. ) 90 capsule 3  . timolol (TIMOPTIC) 0.5 % ophthalmic solution Place 1 drop into both eyes 2 (two) times daily.   1  . Travoprost, BAK Free, (TRAVATAN Z) 0.004 % SOLN ophthalmic solution Place 1 drop into both eyes at bedtime. 5 mL 5  . triamcinolone (KENALOG) 0.025 % cream Apply 1 application topically 2 (two) times daily as needed (rash).      No current facility-administered medications on file prior to visit.    Review of Systems  Constitutional: Negative for other unusual diaphoresis or sweats HENT: Negative for ear discharge or swelling Eyes: Negative for other worsening visual disturbances Respiratory: Negative for stridor or other swelling  Gastrointestinal: Negative for worsening distension or other blood Genitourinary: Negative for retention or other urinary change Musculoskeletal: Negative for other MSK pain or swelling Skin: Negative for color change or other new lesions Neurological: Negative for worsening tremors and other numbness  Psychiatric/Behavioral: Negative for worsening agitation or other fatigue All otherwise neg per pt     Objective:   Physical Exam BP 126/84   Pulse 73   Temp 98.5 F (36.9 C) (Oral)   Ht 5\' 7"  (1.702 m)   Wt 198 lb (89.8 kg)   SpO2 95%   BMI 31.01 kg/m  VS noted,  Constitutional: Pt appears in NAD HENT: Head: NCAT.  Right Ear: External ear normal.  Left Ear: External ear normal.  Eyes: . Pupils are equal, round, and reactive to light. Conjunctivae and EOM are normal Nose: without d/c or deformity Neck: Neck supple. Gross normal ROM Cardiovascular: Normal rate and regular rhythm.   Pulmonary/Chest: Effort normal and breath sounds without rales or wheezing.  Abd:  Soft, NT, ND, + BS, no organomegaly Neurological: Pt is alert. At baseline orientation, motor grossly intact Skin: Skin is warm. No rashes, other new lesions, no LE edema  Psychiatric: Pt behavior is normal without agitation  No other exam  findings  Lab Results  Component Value Date   WBC 5.3 11/29/2018   HGB 14.4 11/29/2018   HCT 40.3 11/29/2018   PLT 223.0 11/29/2018   GLUCOSE 110 (H) 11/29/2018   CHOL 153 11/29/2018   TRIG 225.0 (H) 11/29/2018   HDL 46.50 11/29/2018   LDLDIRECT 75.0 11/29/2018   LDLCALC 65 10/14/2017   ALT 31 11/29/2018   AST 26 11/29/2018   NA 137 11/29/2018   K 4.2 11/29/2018   CL 103 11/29/2018   CREATININE 0.91 11/29/2018   BUN 15 11/29/2018   CO2 26 11/29/2018   TSH 3.01 11/29/2018   PSA 0.53 11/12/2016   INR 1.15 02/23/2018   HGBA1C 5.7 11/29/2018       Assessment & Plan:

## 2019-04-11 NOTE — Assessment & Plan Note (Signed)
stable overall by history and exam, recent data reviewed with pt, and pt to continue medical treatment as before,  to f/u any worsening symptoms or concerns  

## 2019-04-11 NOTE — Patient Instructions (Addendum)
Your arm should continue to heal and does not appear infected today  Please continue all other medications as before, and refills have been done if requested.  Please have the pharmacy call with any other refills you may need.  Please continue your efforts at being more active, low cholesterol diet, and weight control.  Please keep your appointments with your specialists as you may have planned

## 2019-04-11 NOTE — Assessment & Plan Note (Signed)
With slow heaing but no evidence for infection today, pt reassured,  to f/u any worsening symptoms or concerns

## 2019-04-12 ENCOUNTER — Telehealth: Payer: Self-pay

## 2019-04-12 ENCOUNTER — Other Ambulatory Visit: Payer: Self-pay

## 2019-04-18 ENCOUNTER — Other Ambulatory Visit: Payer: Self-pay

## 2019-04-18 NOTE — Patient Outreach (Signed)
Telephone assessment/ new referral:  New referral for join [employer]  Reviewed engagement tool in [place]. Placed call to patient with no answer. Identified machine. Left a message requesting a call back.  PLAN: will await a call back, Will mail letter. If no response will attempt contact again in 3 days.   Tomasa Rand, RN, BSN, CEN [facility] [employer] [phone]

## 2019-04-19 ENCOUNTER — Ambulatory Visit: Payer: Medicare Other | Admitting: Podiatry

## 2019-04-21 ENCOUNTER — Other Ambulatory Visit: Payer: Self-pay

## 2019-04-21 NOTE — Patient Outreach (Signed)
Join [employer] attempt:  2nd attempt to reach patient was unsuccessful today.   Plan: will attempt 3rd contact in 3 days.  Letter already mailed .  Tomasa Rand, RN, BSN, CEN [facility] [employer] [phone]

## 2019-04-25 ENCOUNTER — Other Ambulatory Visit: Payer: Self-pay

## 2019-04-25 ENCOUNTER — Ambulatory Visit (INDEPENDENT_AMBULATORY_CARE_PROVIDER_SITE_OTHER): Payer: Medicare Other | Admitting: Podiatry

## 2019-04-25 ENCOUNTER — Encounter: Payer: Self-pay | Admitting: Podiatry

## 2019-04-25 DIAGNOSIS — M79676 Pain in unspecified toe(s): Secondary | ICD-10-CM

## 2019-04-25 DIAGNOSIS — B351 Tinea unguium: Secondary | ICD-10-CM

## 2019-04-25 NOTE — Patient Instructions (Signed)

## 2019-04-26 ENCOUNTER — Other Ambulatory Visit: Payer: Self-pay

## 2019-04-26 NOTE — Patient Outreach (Signed)
Join [employer] referral:  3rd outreach attempt to patient today without success.  PLAN: will close case on 05/02/2019 if no response.  Tomasa Rand, RN, BSN, CEN [facility] [employer] [phone]

## 2019-04-29 ENCOUNTER — Other Ambulatory Visit: Payer: Self-pay | Admitting: Internal Medicine

## 2019-05-01 NOTE — Progress Notes (Signed)
Subjective:  [name] presents to clinic today with cc of  painful, thick, discolored, elongated toenails 1-5 b/l that become tender and cannot cut because of thickness.  He states his [place] ankle braces help a lot and he states he no longer has pain in his ankles. He feels more stable wearing them.   He is happy he has recently sold his home.  Biagio Borg, MD  Is his PCP.   Current Outpatient Medications on File Prior to Visit  Medication Sig Dispense Refill  . acetaminophen (TYLENOL) 650 MG CR tablet Take 1,300 mg by mouth at bedtime as needed for pain.    Marland Kitchen albuterol (PROVENTIL HFA;VENTOLIN HFA) 108 (90 Base) MCG/ACT inhaler Inhale 2 puffs into the lungs every 6 (six) hours as needed for wheezing or shortness of breath. 54 g 3  . amLODipine (NORVASC) 10 MG tablet TAKE 1 TABLET (10 MG TOTAL) BY MOUTH DAILY. 90 tablet 1  . Ascorbic Acid (VITAMIN C) 1000 MG tablet Take 1,000 mg by mouth daily.    . brimonidine (ALPHAGAN) 0.15 % ophthalmic solution Place 1 drop into both eyes 2 (two) times daily.    . citalopram (CELEXA) 10 MG tablet TAKE 1 TABLET EVERY MORNING 90 tablet 1  . clonazePAM (KLONOPIN) 0.5 MG tablet clonazepam 0.5 mg tablet    . clotrimazole (LOTRIMIN) 1 % cream Apply 1 application topically 2 (two) times daily as needed (FOR RASH). Prescribed for rash in groin    . diazepam (VALIUM) 5 MG tablet as needed.    . fluticasone (FLONASE) 50 MCG/ACT nasal spray USE 2 SPRAYS IN EACH NOSTRIL EVERY DAY 48 g 2  . gabapentin (NEURONTIN) 300 MG capsule TAKE 1 TO 2 CAPSULES FOUR TIMES DAILY  AFTER MEALS AND AT BEDTIME. 720 capsule 3  . hydrALAZINE (APRESOLINE) 10 MG tablet TAKE 1 TABLET THREE TIMES DAILY AS NEEDED FOR BLOOD PRESSURE MORE THAN 150/90 270 tablet 1  . hydrochlorothiazide (HYDRODIURIL) 25 MG tablet TAKE 0.5 TABLETS (12.5 MG TOTAL) BY MOUTH DAILY. 45 tablet 2  . HYDROcodone-acetaminophen (NORCO/VICODIN) 5-325 MG tablet Take 1 tablet by mouth every 6 (six) hours as needed  for severe pain. 10 tablet 0  . hydroxypropyl methylcellulose / hypromellose (ISOPTO TEARS / GONIOVISC) 2.5 % ophthalmic solution Place 1 drop into both eyes 2 (two) times daily.    Marland Kitchen lidocaine (LIDODERM) 5 % Place 1 patch onto the skin daily. Remove & Discard patch within 12 hours or as directed by MD 30 patch 0  . losartan (COZAAR) 100 MG tablet TAKE 1 TABLET EVERY DAY 90 tablet 1  . meclizine (ANTIVERT) 25 MG tablet TAKE 1 TABLET TWICE DAILY AS NEEDED FOR DIZZINESS 180 tablet 1  . methocarbamol (ROBAXIN) 500 MG tablet methocarbamol 500 mg tablet  Take 1 tablet 3 times a day by oral route as needed.    . nitroGLYCERIN (NITROSTAT) 0.4 MG SL tablet PLACE 1 TABLET UNDER THE TONGUE EVERY 5 MINUTES AS NEEDED FOR CHEST PAIN. MAX 3 DOSES. CALL 911 IF NO IMPROVEMENT AFTER 1ST DOSE (Patient taking differently: Place 0.4 mg under the tongue every 5 (five) minutes as needed for chest pain. ) 25 tablet 0  . Nutritional Supplements (JUICE PLUS FIBRE PO) Take 1 each 2 (two) times daily by mouth.    Marland Kitchen omeprazole (PRILOSEC) 40 MG capsule TAKE 1 CAPSULE TWICE DAILY 180 capsule 3  . ondansetron (ZOFRAN) 4 MG tablet Take 1 tablet (4 mg total) by mouth every 8 (eight) hours as needed  for nausea or vomiting. 30 tablet 2  . potassium chloride (K-DUR) 10 MEQ tablet TAKE 2 TABLETS EVERY DAY 180 tablet 1  . primidone (MYSOLINE) 250 MG tablet TAKE 1/2 TABLET THREE TIMES DAILY 135 tablet 0  . psyllium (METAMUCIL SMOOTH TEXTURE) 58.6 % powder Take 2 tablespoons daily. Drink 12 oz of water 283 g 12  . tamsulosin (FLOMAX) 0.4 MG CAPS capsule Take 1 capsule (0.4 mg total) by mouth daily. (Patient taking differently: Take 0.8 mg by mouth at bedtime. ) 90 capsule 3  . timolol (TIMOPTIC) 0.5 % ophthalmic solution Place 1 drop into both eyes 2 (two) times daily.   1  . Travoprost, BAK Free, (TRAVATAN Z) 0.004 % SOLN ophthalmic solution Place 1 drop into both eyes at bedtime. 5 mL 5  . triamcinolone (KENALOG) 0.025 % cream Apply 1  application topically 2 (two) times daily as needed (rash).      No current facility-administered medications on file prior to visit.      Allergies  Allergen Reactions  . Morphine And Related Other (See Comments)    Migraine   . Hydrochlorothiazide Other (See Comments)    REACTION: hyponatremia with daily use  . Tape Itching    Patient  Is ok to use paper tape  . Sulfur Dioxide   . Latex Rash  . Lisinopril Cough  . Sulfa Antibiotics Rash     Objective: There were no vitals filed for this visit.  Physical Examination:  Vascular Examination: Capillary refill time immediate x 10 digits.  Palpable DP/PT pulses b/l.  Digital hair sparse b/l.   No edema noted b/l.  Skin temperature gradient WNL b/l.  Dermatological Examination: Skin with normal turgor, texture and tone b/l.  No open wounds b/l.  No interdigital macerations noted b/l.  Elongated, thick, discolored brittle toenails with subungual debris and pain on dorsal palpation of nailbeds 1-5 b/l.  Musculoskeletal Examination: Muscle strength 5/5 to all muscle groups b/l.  No pain, crepitus or joint discomfort with active/passive ROM.  Neurological Examination: Sensation intact 5/5 b/l with 10 gram monofilament.  Vibratory sensation intact b/l.  Proprioceptive sensation intact b/l.  Assessment: Mycotic nail infection with pain 1-5 b/l  Plan: 1. Toenails 1-5 b/l were debrided in length and girth without iatrogenic laceration. 2.  Continue soft, supportive shoe gear daily. 3.  Report any pedal injuries to medical professional. 4.  Follow up 3 months. 5.  Patient/POA to call should there be a question/concern in there interim.

## 2019-05-02 ENCOUNTER — Other Ambulatory Visit: Payer: Self-pay

## 2019-05-02 NOTE — Patient Outreach (Signed)
Case closure:  No response to outreach efforts, letter or phone calls.  PLAN: close case.  Tomasa Rand, RN, BSN, CEN [facility] [employer] [phone]

## 2019-05-07 ENCOUNTER — Other Ambulatory Visit: Payer: Self-pay | Admitting: Internal Medicine

## 2019-05-31 ENCOUNTER — Other Ambulatory Visit: Payer: Self-pay | Admitting: Internal Medicine

## 2019-06-03 ENCOUNTER — Other Ambulatory Visit: Payer: Self-pay

## 2019-06-03 DIAGNOSIS — Z20822 Contact with and (suspected) exposure to covid-19: Secondary | ICD-10-CM

## 2019-06-03 DIAGNOSIS — Z20828 Contact with and (suspected) exposure to other viral communicable diseases: Secondary | ICD-10-CM | POA: Diagnosis not present

## 2019-06-05 LAB — NOVEL CORONAVIRUS, NAA: SARS-CoV-2, NAA: NOT DETECTED

## 2019-06-24 ENCOUNTER — Ambulatory Visit: Payer: Medicare Other | Admitting: Internal Medicine

## 2019-06-24 ENCOUNTER — Other Ambulatory Visit (INDEPENDENT_AMBULATORY_CARE_PROVIDER_SITE_OTHER): Payer: Medicare Other

## 2019-06-24 ENCOUNTER — Other Ambulatory Visit: Payer: Self-pay

## 2019-06-24 ENCOUNTER — Encounter: Payer: Self-pay | Admitting: Internal Medicine

## 2019-06-24 ENCOUNTER — Ambulatory Visit (INDEPENDENT_AMBULATORY_CARE_PROVIDER_SITE_OTHER): Payer: Medicare Other | Admitting: Internal Medicine

## 2019-06-24 VITALS — BP 132/78 | HR 63 | Temp 98.3°F | Ht 67.0 in | Wt 206.0 lb

## 2019-06-24 DIAGNOSIS — E785 Hyperlipidemia, unspecified: Secondary | ICD-10-CM

## 2019-06-24 DIAGNOSIS — N183 Chronic kidney disease, stage 3 unspecified: Secondary | ICD-10-CM | POA: Diagnosis not present

## 2019-06-24 DIAGNOSIS — R7302 Impaired glucose tolerance (oral): Secondary | ICD-10-CM | POA: Diagnosis not present

## 2019-06-24 DIAGNOSIS — E559 Vitamin D deficiency, unspecified: Secondary | ICD-10-CM | POA: Diagnosis not present

## 2019-06-24 DIAGNOSIS — R062 Wheezing: Secondary | ICD-10-CM | POA: Diagnosis not present

## 2019-06-24 DIAGNOSIS — I10 Essential (primary) hypertension: Secondary | ICD-10-CM | POA: Diagnosis not present

## 2019-06-24 DIAGNOSIS — R413 Other amnesia: Secondary | ICD-10-CM

## 2019-06-24 LAB — BASIC METABOLIC PANEL
BUN: 12 mg/dL (ref 6–23)
CO2: 25 mEq/L (ref 19–32)
Calcium: 9 mg/dL (ref 8.4–10.5)
Chloride: 105 mEq/L (ref 96–112)
Creatinine, Ser: 1.02 mg/dL (ref 0.40–1.50)
GFR: 69.93 mL/min (ref >60.00)
Glucose, Bld: 103 mg/dL — ABNORMAL HIGH (ref 70–99)
Potassium: 4.2 mEq/L (ref 3.5–5.1)
Sodium: 139 mEq/L (ref 135–145)

## 2019-06-24 LAB — HEPATIC FUNCTION PANEL
ALT: 59 U/L — ABNORMAL HIGH (ref 0–53)
AST: 38 U/L — ABNORMAL HIGH (ref 0–37)
Albumin: 4.1 g/dL (ref 3.5–5.2)
Alkaline Phosphatase: 84 U/L (ref 39–117)
Bilirubin, Direct: 0.1 mg/dL (ref 0.0–0.3)
Total Bilirubin: 0.5 mg/dL (ref 0.2–1.2)
Total Protein: 6.6 g/dL (ref 6.0–8.3)

## 2019-06-24 LAB — CBC WITH DIFFERENTIAL/PLATELET
Basophils Absolute: 0 10*3/uL (ref 0.0–0.1)
Basophils Relative: 0.5 % (ref 0.0–3.0)
Eosinophils Absolute: 0.2 10*3/uL (ref 0.0–0.7)
Eosinophils Relative: 2.8 % (ref 0.0–5.0)
HCT: 41.7 % (ref 39.0–52.0)
Hemoglobin: 14 g/dL (ref 13.0–17.0)
Lymphocytes Relative: 18.5 % (ref 12.0–46.0)
Lymphs Abs: 1.1 10*3/uL (ref 0.7–4.0)
MCHC: 33.7 g/dL (ref 30.0–36.0)
MCV: 97.6 fl (ref 78.0–100.0)
Monocytes Absolute: 0.7 10*3/uL (ref 0.1–1.0)
Monocytes Relative: 11.7 % (ref 3.0–12.0)
Neutro Abs: 3.8 10*3/uL (ref 1.4–7.7)
Neutrophils Relative %: 66.5 % (ref 43.0–77.0)
Platelets: 190 10*3/uL (ref 150.0–400.0)
RBC: 4.27 Mil/uL (ref 4.22–5.81)
RDW: 14.3 % (ref 11.5–15.5)
WBC: 5.7 10*3/uL (ref 4.0–10.5)

## 2019-06-24 LAB — URINALYSIS, ROUTINE W REFLEX MICROSCOPIC
Bilirubin Urine: NEGATIVE
Hgb urine dipstick: NEGATIVE
Ketones, ur: NEGATIVE
Leukocytes,Ua: NEGATIVE
Nitrite: NEGATIVE
Specific Gravity, Urine: >=1.03 — AB (ref 1.000–1.030)
Total Protein, Urine: NEGATIVE
Urine Glucose: NEGATIVE
Urobilinogen, UA: 0.2 (ref 0.0–1.0)
pH: 5.5 (ref 5.0–8.0)

## 2019-06-24 LAB — LIPID PANEL
Cholesterol: 164 mg/dL (ref 0–200)
HDL: 48.8 mg/dL (ref >39.00)
NonHDL: 115.34
Total CHOL/HDL Ratio: 3
Triglycerides: 248 mg/dL — ABNORMAL HIGH (ref 0.0–149.0)
VLDL: 49.6 mg/dL — ABNORMAL HIGH (ref 0.0–40.0)

## 2019-06-24 LAB — TSH: TSH: 3.78 u[IU]/mL (ref 0.35–4.50)

## 2019-06-24 LAB — HEMOGLOBIN A1C: Hgb A1c MFr Bld: 5.6 % (ref 4.6–6.5)

## 2019-06-24 LAB — VITAMIN B12: Vitamin B-12: 180 pg/mL — ABNORMAL LOW (ref 211–911)

## 2019-06-24 LAB — VITAMIN D 25 HYDROXY (VIT D DEFICIENCY, FRACTURES): VITD: 23.69 ng/mL — ABNORMAL LOW (ref 30.00–100.00)

## 2019-06-24 LAB — LDL CHOLESTEROL, DIRECT: Direct LDL: 86 mg/dL

## 2019-06-24 MED ORDER — BUDESONIDE-FORMOTEROL FUMARATE 160-4.5 MCG/ACT IN AERO: 2.0000 | INHALATION_SPRAY | Freq: Two times a day (BID) | RESPIRATORY_TRACT | 3 refills | Status: DC

## 2019-06-24 MED ORDER — PREDNISONE 10 MG PO TABS: ORAL_TABLET | ORAL | 0 refills | Status: DC

## 2019-06-24 MED ORDER — BUDESONIDE-FORMOTEROL FUMARATE 160-4.5 MCG/ACT IN AERO: 2.0000 | INHALATION_SPRAY | Freq: Two times a day (BID) | RESPIRATORY_TRACT | 11 refills | Status: DC

## 2019-06-24 MED ORDER — METHYLPREDNISOLONE ACETATE 80 MG/ML IJ SUSP
80.0000 mg | Freq: Once | INTRAMUSCULAR | Status: AC
  Administered 2019-06-24: 12:00:00 80 mg via INTRAMUSCULAR

## 2019-06-24 NOTE — Progress Notes (Signed)
Subjective:    Patient ID: [name], male    DOB: 04/12/1936, 83 y.o.   MRN: [id number]  HPI   Here to f/u with son for support who reports significant issues with 3 mo worsening short term memory issues, asking the same question several times over a few hours, new onset occasional hallucinations at night, sleep walking, but Denies worsening depressive symptoms, suicidal ideation, or panic; has ongoing anxiety, not increased recently.  Has ongoing intermittent dizziness as well, unable for MRI due to implanted stimulator.  No recent b12 level .  Pt denies chest pain, increased sob or doe, wheezing, orthopnea, PND, increased LE swelling, palpitations, dizziness or syncope, except for worsening intermittent mild wheezing x 3-4 months, and albuterol inhaler just not helping as much anymore, using quite often per week.   Pt denies fever, wt loss, night sweats, loss of appetite, or other constitutional symptoms   Past Medical History:  Diagnosis Date  . Acute bronchitis per pt no fever--  cough since discharge from hospital 07-02-2015   per pcp note 07-05-2015  mild to moderate bronchitis vs pna  . Allergic rhinitis   . Bladder cancer ([place])   . BPH (benign prostatic hyperplasia)   . Bradycardia   . CAD (coronary artery disease)   . Chronic low back pain   . DDD (degenerative disc disease), lumbosacral   . Depression   . Diverticulosis of colon (without mention of hemorrhage)   . Dyspnea   . Essential tremor   . Fatty liver disease, nonalcoholic   . GERD (gastroesophageal reflux disease)   . Glaucoma   . History of bladder cancer    2004--  TCC low-grade , non-invasive  . History of colon polyps   . History of exercise stress test    02-05-2007-- ETT (Clinically positive, electrically equivocal, submaximal ETT,  appropriate bp response to exercise  . History of hiatal hernia   . History of kidney stones   . History of peptic ulcer   . HLD (hyperlipidemia)   . HTN (hypertension)   .  Nocturia   . Productive cough   . Rheumatoid arthritis(714.0)   . Right renal artery stenosis ([facility])    per cath 03-14-2009 --- 40-50%  . RLS (restless legs syndrome)   . Vertigo, intermittent   . Weak urinary stream   . Wears dentures    upper  . Wears glasses   . Wears hearing aid    bilateral  . Wears partial dentures    lower   Past Surgical History:  Procedure Laterality Date  . CARDIAC CATHETERIZATION  02-12-2007  dr gregg taylor   Non-obstructive CAD,  20% pLCX,  20% LAD, ef 65%  . CARDIAC CATHETERIZATION  03-14-2009  dr Johnsie Cancel   pLAD and mid diaginal 20-30% multiple lesions,  OM1 20%, right renal ostial 40-50%,  ef 60%  . CATARACT EXTRACTION W/ INTRAOCULAR LENS  IMPLANT, BILATERAL  2016  . CYSTOSCOPY WITH BIOPSY N/A 07/11/2015   Procedure: CYSTOSCOPY WITH COLD CUP RESECTION AND FULGERATION;  Surgeon: Rana Snare, MD;  Location: [facility];  Service: Urology;  Laterality: N/A;  . HIP ARTHROSCOPY W/ LABRAL DEBRIDEMENT Left 06-29-2000   and chondraplasty  . LUMBAR [place] SURGERY  07-16-2005  . LUMBAR LAMINECTOMY/DECOMPRESSION MICRODISCECTOMY  07/09/2011   Procedure: LUMBAR LAMINECTOMY/DECOMPRESSION MICRODISCECTOMY;  Surgeon: Johnn Hai;  Location: [facility];  Service: Orthopedics;  Laterality: Left;  Decompression L5 - S1 on the Left and repair of dura  .  LUMBAR LAMINECTOMY/DECOMPRESSION MICRODISCECTOMY N/A 07/27/2013   Procedure: DECOMPRESSION L4 - L5 WITH EXCISION OF SYNOVIAL CYST AND, LATERAL MASS FUSION 1 LEVEL;  Surgeon: Johnn Hai, MD;  Location: [facility];  Service: Orthopedics;  Laterality: N/A;  . LUMBAR LAMINECTOMY/DECOMPRESSION MICRODISCECTOMY Left 07/06/2014   Procedure:  MICRO LUMBAR DECOMPRESSION L5-S1 ON LEFT, L4-5 REDO;  Surgeon: Johnn Hai, MD;  Location: [facility];  Service: Orthopedics;  Laterality: Left;  . SPINAL CORD STIMULATOR INSERTION N/A 09/11/2016   Procedure: Spinal cord stimulator placement;  Surgeon: Melina Schools, MD;  Location:  [place];  Service: Orthopedics;  Laterality: N/A;  . TONSILLECTOMY AND ADENOIDECTOMY    . TRANSTHORACIC ECHOCARDIOGRAM  09-20-2010   normal LVF, ef 55-65%/  mild MR, TR and PR/  mild LAE  . TRANSURETHRAL RESECTION OF BLADDER TUMOR  09-21-2002    reports that he quit smoking about 36 years ago. His smoking use included cigarettes. He has a 40.00 pack-year smoking history. He has never used smokeless tobacco. He reports current alcohol use. He reports that he does not use drugs. family history includes Cancer in his brother, father, and sister; Diabetes in his sister; Heart attack in his mother. Allergies  Allergen Reactions  . Morphine And Related Other (See Comments)    Migraine   . Hydrochlorothiazide Other (See Comments)    REACTION: hyponatremia with daily use  . Tape Itching    Patient  Is ok to use paper tape  . Sulfur Dioxide   . Latex Rash  . Lisinopril Cough  . Sulfa Antibiotics Rash   Current Outpatient Medications on File Prior to Visit  Medication Sig Dispense Refill  . acetaminophen (TYLENOL) 650 MG CR tablet Take 1,300 mg by mouth at bedtime as needed for pain.    Marland Kitchen albuterol (PROVENTIL HFA;VENTOLIN HFA) 108 (90 Base) MCG/ACT inhaler Inhale 2 puffs into the lungs every 6 (six) hours as needed for wheezing or shortness of breath. 54 g 3  . amLODipine (NORVASC) 10 MG tablet TAKE 1 TABLET (10 MG TOTAL) DAILY. 90 tablet 1  . Ascorbic Acid (VITAMIN C) 1000 MG tablet Take 1,000 mg by mouth daily.    . brimonidine (ALPHAGAN) 0.15 % ophthalmic solution Place 1 drop into both eyes 2 (two) times daily.    . citalopram (CELEXA) 10 MG tablet TAKE 1 TABLET EVERY MORNING 90 tablet 1  . clonazePAM (KLONOPIN) 0.5 MG tablet clonazepam 0.5 mg tablet    . clotrimazole (LOTRIMIN) 1 % cream Apply 1 application topically 2 (two) times daily as needed (FOR RASH). Prescribed for rash in groin    . fluticasone (FLONASE) 50 MCG/ACT nasal spray USE 2 SPRAYS IN EACH NOSTRIL EVERY DAY 48 g 2  .  gabapentin (NEURONTIN) 300 MG capsule TAKE 1 TO 2 CAPSULES FOUR TIMES DAILY  AFTER MEALS AND AT BEDTIME. 720 capsule 3  . hydrALAZINE (APRESOLINE) 10 MG tablet TAKE 1 TABLET THREE TIMES DAILY AS NEEDED FOR BLOOD PRESSURE MORE THAN 150/90 270 tablet 1  . hydrochlorothiazide (HYDRODIURIL) 25 MG tablet TAKE 0.5 TABLETS (12.5 MG TOTAL) BY MOUTH DAILY. 45 tablet 2  . HYDROcodone-acetaminophen (NORCO/VICODIN) 5-325 MG tablet Take 1 tablet by mouth every 6 (six) hours as needed for severe pain. 10 tablet 0  . hydroxypropyl methylcellulose / hypromellose (ISOPTO TEARS / GONIOVISC) 2.5 % ophthalmic solution Place 1 drop into both eyes 2 (two) times daily.    Marland Kitchen lidocaine (LIDODERM) 5 % Place 1 patch onto the skin daily. Remove & Discard patch within 12  hours or as directed by MD 30 patch 0  . losartan (COZAAR) 100 MG tablet TAKE 1 TABLET EVERY DAY 90 tablet 1  . meclizine (ANTIVERT) 25 MG tablet TAKE 1 TABLET TWICE DAILY AS NEEDED FOR DIZZINESS 180 tablet 1  . nitroGLYCERIN (NITROSTAT) 0.4 MG SL tablet PLACE 1 TABLET UNDER THE TONGUE EVERY 5 MINUTES AS NEEDED FOR CHEST PAIN. MAX 3 DOSES. CALL 911 IF NO IMPROVEMENT AFTER 1ST DOSE (Patient taking differently: Place 0.4 mg under the tongue every 5 (five) minutes as needed for chest pain. ) 25 tablet 0  . Nutritional Supplements (JUICE PLUS FIBRE PO) Take 1 each 2 (two) times daily by mouth.    Marland Kitchen omeprazole (PRILOSEC) 40 MG capsule TAKE 1 CAPSULE TWICE DAILY 180 capsule 3  . ondansetron (ZOFRAN) 4 MG tablet Take 1 tablet (4 mg total) by mouth every 8 (eight) hours as needed for nausea or vomiting. 30 tablet 2  . potassium chloride (K-DUR) 10 MEQ tablet TAKE 2 TABLETS EVERY DAY 180 tablet 1  . primidone (MYSOLINE) 250 MG tablet TAKE 1/2 TABLET THREE TIMES DAILY 135 tablet 0  . psyllium (METAMUCIL SMOOTH TEXTURE) 58.6 % powder Take 2 tablespoons daily. Drink 12 oz of water 283 g 12  . simvastatin (ZOCOR) 40 MG tablet TAKE 1 AND 1/2 TABLETS AT BEDTIME 135 tablet 1  .  tamsulosin (FLOMAX) 0.4 MG CAPS capsule Take 1 capsule (0.4 mg total) by mouth daily. (Patient taking differently: Take 0.8 mg by mouth at bedtime. ) 90 capsule 3  . timolol (TIMOPTIC) 0.5 % ophthalmic solution Place 1 drop into both eyes 2 (two) times daily.   1  . Travoprost, BAK Free, (TRAVATAN Z) 0.004 % SOLN ophthalmic solution Place 1 drop into both eyes at bedtime. 5 mL 5  . triamcinolone (KENALOG) 0.025 % cream Apply 1 application topically 2 (two) times daily as needed (rash).      No current facility-administered medications on file prior to visit.   Review of Systems  Constitutional: Negative for other unusual diaphoresis or sweats HENT: Negative for ear discharge or swelling Eyes: Negative for other worsening visual disturbances Respiratory: Negative for stridor or other swelling  Gastrointestinal: Negative for worsening distension or other blood Genitourinary: Negative for retention or other urinary change Musculoskeletal: Negative for other MSK pain or swelling Skin: Negative for color change or other new lesions Neurological: Negative for worsening tremors and other numbness  Psychiatric/Behavioral: Negative for worsening agitation or other fatigue All otherwise neg per pt     Objective:   Physical Exam BP 132/78   Pulse 63   Temp 98.3 F (36.8 C) (Oral)   Ht 5\' 7"  (1.702 m)   Wt 206 lb (93.4 kg)   SpO2 97%   BMI 32.26 kg/m  VS noted,  Constitutional: Pt appears in NAD HENT: Head: NCAT.  Right Ear: External ear normal.  Left Ear: External ear normal.  Eyes: . Pupils are equal, round, and reactive to light. Conjunctivae and EOM are normal Nose: without d/c or deformity Neck: Neck supple. Gross normal ROM Cardiovascular: Normal rate and regular rhythm.   Pulmonary/Chest: Effort normal and breath sounds without rales or wheezing.  Abd:  Soft, NT, ND, + BS, no organomegaly Neurological: Pt is alert. At baseline orientation, motor grossly intact Skin: Skin is  warm. No rashes, other new lesions, no LE edema Psychiatric: Pt behavior is normal without agitation  All otherwise neg per pt Lab Results  Component Value Date   WBC 5.7  06/24/2019   HGB 14.0 06/24/2019   HCT 41.7 06/24/2019   PLT 190.0 06/24/2019   GLUCOSE 103 (H) 06/24/2019   CHOL 164 06/24/2019   TRIG 248.0 (H) 06/24/2019   HDL 48.80 06/24/2019   LDLDIRECT 86.0 06/24/2019   LDLCALC 65 10/14/2017   ALT 59 (H) 06/24/2019   AST 38 (H) 06/24/2019   NA 139 06/24/2019   K 4.2 06/24/2019   CL 105 06/24/2019   CREATININE 1.02 06/24/2019   BUN 12 06/24/2019   CO2 25 06/24/2019   TSH 3.78 06/24/2019   PSA 0.53 11/12/2016   INR 1.15 02/23/2018   HGBA1C 5.6 06/24/2019      Assessment & Plan:

## 2019-06-24 NOTE — Patient Instructions (Addendum)
You had the steroid shot today  Please take all new medication as prescribed  - the prednisone for Short time, and Symbicort for the long term  Please continue all other medications as before  Please have the pharmacy call with any other refills you may need.  Please continue your efforts at being more active, low cholesterol diet, and weight control.  You are otherwise up to date with prevention measures today.  Please keep your appointments with your specialists as you may have planned  You will be contacted regarding the referral for: neurology  Please go to the LAB in the Basement (turn left off the elevator) for the tests to be done today  You will be contacted by phone if any changes need to be made immediately.  Otherwise, you will receive a letter about your results with an explanation, but please check with MyChart first.  Please remember to sign up for MyChart if you have not done so, as this will be important to you in the future with finding out test results, communicating by private email, and scheduling acute appointments online when needed.  OK to cancel the Dec 16 appt with me  Please return in 3 months, or sooner if needed

## 2019-06-26 ENCOUNTER — Encounter: Payer: Self-pay | Admitting: Internal Medicine

## 2019-06-26 NOTE — Assessment & Plan Note (Signed)
stable overall by history and exam, recent data reviewed with pt, and pt to continue medical treatment as before,  to f/u any worsening symptoms or concerns  

## 2019-06-26 NOTE — Assessment & Plan Note (Addendum)
C/w bronchospastic, for predpac asd after depomedrol IM,  to add symbicort asd, cont inhaler prn,  to f/u any worsening symptoms or concerns

## 2019-06-26 NOTE — Assessment & Plan Note (Addendum)
C/w new onset probable dementia, to check b12, labs, but unable for MRI, and refer neurology  Note:  Total time for pt hx, exam, review of record with pt in the room, determination of diagnoses and plan for further eval and tx is > 40 min, with over 50% spent in coordination and counseling of patient including the differential dx, tx, further evaluation and other management of memory loss, wheezing, HLD, HTN, CKD, hyperglycemia

## 2019-06-27 ENCOUNTER — Other Ambulatory Visit: Payer: Self-pay | Admitting: Internal Medicine

## 2019-06-27 MED ORDER — VITAMIN D (ERGOCALCIFEROL) 1.25 MG (50000 UNIT) PO CAPS: 50000.0000 [IU] | ORAL_CAPSULE | ORAL | 0 refills | Status: DC

## 2019-06-29 ENCOUNTER — Encounter: Payer: Self-pay | Admitting: Neurology

## 2019-06-29 ENCOUNTER — Ambulatory Visit: Payer: Medicare Other | Admitting: Internal Medicine

## 2019-07-14 ENCOUNTER — Other Ambulatory Visit: Payer: Self-pay | Admitting: Internal Medicine

## 2019-07-14 NOTE — Telephone Encounter (Signed)
As per routine refill policy - I have not reviewed the details

## 2019-07-14 NOTE — Telephone Encounter (Signed)
Per routine 

## 2019-07-14 NOTE — Telephone Encounter (Signed)
Medical screening examination/treatment/procedure(s) were performed by non-physician practitioner and as supervising physician I was immediately available for consultation/collaboration. I agree with above. Matteo Banke, MD   

## 2019-07-18 ENCOUNTER — Ambulatory Visit (INDEPENDENT_AMBULATORY_CARE_PROVIDER_SITE_OTHER): Payer: Medicare Other | Admitting: Family

## 2019-07-18 DIAGNOSIS — J209 Acute bronchitis, unspecified: Secondary | ICD-10-CM

## 2019-07-18 MED ORDER — BENZONATATE 100 MG PO CAPS: 100.0000 mg | ORAL_CAPSULE | Freq: Three times a day (TID) | ORAL | 0 refills | Status: DC | PRN

## 2019-07-18 MED ORDER — DOXYCYCLINE HYCLATE 100 MG PO TABS: 100.0000 mg | ORAL_TABLET | Freq: Two times a day (BID) | ORAL | 0 refills | Status: DC

## 2019-07-18 NOTE — Progress Notes (Signed)
[name] is a 84 y.o. male with the following history as recorded in EpicCare:  Patient Active Problem List   Diagnosis Date Noted  . Memory loss 06/24/2019  . Wheezing 06/24/2019  . Abrasion of left arm 04/11/2019  . Joint swelling of lower leg 11/29/2018  . Abnormal urinalysis 02/25/2018  . Anxiety 01/08/2018  . Laceration of hand 12/18/2017  . Pain of left hand 12/18/2017  . Laceration of scalp without foreign body 06/09/2017  . Nonintractable headache 06/09/2017  . Acute sinus infection 04/14/2017  . Nausea and vomiting 03/27/2017  . Hyponatremia 03/17/2017  . Right lumbar radiculopathy 01/21/2017  . Fall 01/21/2017  . Synovial cyst 01/21/2017  . Chronic pain 09/11/2016  . Generalized weakness 05/22/2016  . UTI (urinary tract infection) 05/22/2016  . Weakness 05/22/2016  . Fever blister 10/18/2015  . Dyspnea 10/17/2015  . Bradycardia 09/17/2015  . Chest pain at rest 09/17/2015  . CAD in native artery 09/17/2015  . CHF (congestive heart failure) ([place]) 09/17/2015  . Pulmonary hypertension ([place]) 09/17/2015  . Bladder cancer ([place]) 09/17/2015  . Essential tremor 09/17/2015  . Chest pain 09/17/2015  . Anginal chest pain at rest [facility]) 09/17/2015  . Hypokalemia 07/13/2015  . Vertigo 07/01/2015  . Dizzinesses 07/01/2015  . Spinal stenosis of lumbar region at multiple levels 07/06/2014  . Angular cheilitis 05/05/2014  . Left lumbar radiculopathy 05/04/2014  . Right elbow pain 12/29/2013  . Lateral epicondylitis of right elbow 12/29/2013  . Cough 09/14/2013  . Spinal stenosis, lumbar 07/27/2013  . Palpable mass of neck 07/21/2013  . Vasovagal episode 01/10/2013  . Left otitis media 10/28/2012  . Impaired glucose tolerance 10/28/2012  . Cellulitis 04/24/2012  . Hemorrhoids 04/24/2012  . Syncope 04/21/2012  . Preventative health care 11/28/2010  . Depression 09/12/2010  . RENAL ARTERY STENOSIS 09/12/2010  . FATIGUE 09/12/2010  . BUNION, RIGHT FOOT 02/05/2010  .  RECTAL BLEEDING 07/10/2009  . Diarrhea 07/10/2009  . DIVERTICULOSIS OF COLON 03/28/2009  . FATTY LIVER DISEASE 03/28/2009  . BRADYCARDIA 03/20/2009  . DIZZINESS 10/17/2008  . CHEST PAIN 10/17/2008  . Malignant neoplasm of bladder ([place]) 02/07/2008  . Allergic rhinitis 02/07/2008  . COLONIC POLYPS, HX OF 02/07/2008  . Hyperlipidemia 11/24/2007  . Essential hypertension 11/24/2007  . GERD 11/24/2007  . HIATAL HERNIA WITH REFLUX 11/24/2007  . CKD (chronic kidney disease) stage 3, GFR 30-59 ml/min 11/24/2007  . Rheumatoid arthritis ([place]) 11/24/2007  . [place] DISEASE, LUMBAR 11/24/2007  . ADENOIDECTOMY, HX OF 11/24/2007    Current Outpatient Medications  Medication Sig Dispense Refill  . acetaminophen (TYLENOL) 650 MG CR tablet Take 1,300 mg by mouth at bedtime as needed for pain.    Marland Kitchen albuterol (PROVENTIL HFA;VENTOLIN HFA) 108 (90 Base) MCG/ACT inhaler Inhale 2 puffs into the lungs every 6 (six) hours as needed for wheezing or shortness of breath. 54 g 3  . amLODipine (NORVASC) 10 MG tablet TAKE 1 TABLET (10 MG TOTAL) DAILY. 90 tablet 1  . Ascorbic Acid (VITAMIN C) 1000 MG tablet Take 1,000 mg by mouth daily.    . benzonatate (TESSALON) 100 MG capsule Take 1 capsule (100 mg total) by mouth 3 (three) times daily as needed. 30 capsule 0  . brimonidine (ALPHAGAN) 0.15 % ophthalmic solution Place 1 drop into both eyes 2 (two) times daily.    . budesonide-formoterol (SYMBICORT) 160-4.5 MCG/ACT inhaler Inhale 2 puffs into the lungs 2 (two) times daily. 3 Inhaler 3  . citalopram (CELEXA) 10 MG tablet TAKE 1  TABLET EVERY MORNING 90 tablet 1  . clonazePAM (KLONOPIN) 0.5 MG tablet clonazepam 0.5 mg tablet    . clotrimazole (LOTRIMIN) 1 % cream Apply 1 application topically 2 (two) times daily as needed (FOR RASH). Prescribed for rash in groin    . doxycycline (VIBRA-TABS) 100 MG tablet Take 1 tablet (100 mg total) by mouth 2 (two) times daily. 20 tablet 0  . fluticasone (FLONASE) 50 MCG/ACT nasal  spray USE 2 SPRAYS IN EACH NOSTRIL EVERY DAY 48 g 2  . gabapentin (NEURONTIN) 300 MG capsule TAKE 1 TO 2 CAPSULES FOUR TIMES DAILY  AFTER MEALS AND AT BEDTIME. 720 capsule 3  . hydrALAZINE (APRESOLINE) 10 MG tablet TAKE 1 TABLET THREE TIMES DAILY AS NEEDED FOR BLOOD PRESSURE MORE THAN 150/90 270 tablet 1  . hydrochlorothiazide (HYDRODIURIL) 25 MG tablet TAKE 0.5 TABLETS (12.5 MG TOTAL) BY MOUTH DAILY. 45 tablet 2  . HYDROcodone-acetaminophen (NORCO/VICODIN) 5-325 MG tablet Take 1 tablet by mouth every 6 (six) hours as needed for severe pain. 10 tablet 0  . hydroxypropyl methylcellulose / hypromellose (ISOPTO TEARS / GONIOVISC) 2.5 % ophthalmic solution Place 1 drop into both eyes 2 (two) times daily.    Marland Kitchen lidocaine (LIDODERM) 5 % Place 1 patch onto the skin daily. Remove & Discard patch within 12 hours or as directed by MD 30 patch 0  . losartan (COZAAR) 100 MG tablet TAKE 1 TABLET EVERY DAY 90 tablet 1  . meclizine (ANTIVERT) 25 MG tablet TAKE 1 TABLET TWICE DAILY AS NEEDED FOR DIZZINESS 180 tablet 1  . nitroGLYCERIN (NITROSTAT) 0.4 MG SL tablet PLACE 1 TABLET UNDER THE TONGUE EVERY 5 MINUTES AS NEEDED FOR CHEST PAIN. MAX 3 DOSES. CALL 911 IF NO IMPROVEMENT AFTER 1ST DOSE (Patient taking differently: Place 0.4 mg under the tongue every 5 (five) minutes as needed for chest pain. ) 25 tablet 0  . Nutritional Supplements (JUICE PLUS FIBRE PO) Take 1 each 2 (two) times daily by mouth.    Marland Kitchen omeprazole (PRILOSEC) 40 MG capsule TAKE 1 CAPSULE TWICE DAILY 180 capsule 3  . ondansetron (ZOFRAN) 4 MG tablet Take 1 tablet (4 mg total) by mouth every 8 (eight) hours as needed for nausea or vomiting. 30 tablet 2  . potassium chloride (KLOR-CON) 10 MEQ tablet TAKE 2 TABLETS EVERY DAY 180 tablet 1  . primidone (MYSOLINE) 250 MG tablet TAKE 1/2 TABLET THREE TIMES DAILY 135 tablet 0  . psyllium (METAMUCIL SMOOTH TEXTURE) 58.6 % powder Take 2 tablespoons daily. Drink 12 oz of water 283 g 12  . simvastatin (ZOCOR) 40  MG tablet TAKE 1 AND 1/2 TABLETS AT BEDTIME 135 tablet 1  . tamsulosin (FLOMAX) 0.4 MG CAPS capsule Take 1 capsule (0.4 mg total) by mouth daily. (Patient taking differently: Take 0.8 mg by mouth at bedtime. ) 90 capsule 3  . timolol (TIMOPTIC) 0.5 % ophthalmic solution Place 1 drop into both eyes 2 (two) times daily.   1  . Travoprost, BAK Free, (TRAVATAN Z) 0.004 % SOLN ophthalmic solution Place 1 drop into both eyes at bedtime. 5 mL 5  . triamcinolone (KENALOG) 0.025 % cream Apply 1 application topically 2 (two) times daily as needed (rash).     . Vitamin D, Ergocalciferol, (DRISDOL) 1.25 MG (50000 UT) CAPS capsule Take 1 capsule (50,000 Units total) by mouth every 7 (seven) days. 12 capsule 0   No current facility-administered medications for this visit.    Allergies: Morphine and related, Hydrochlorothiazide, Tape, Sulfur dioxide, Latex, Lisinopril, and  Sulfa antibiotics  Past Medical History:  Diagnosis Date  . Acute bronchitis per pt no fever--  cough since discharge from hospital 07-02-2015   per pcp note 07-05-2015  mild to moderate bronchitis vs pna  . Allergic rhinitis   . Bladder cancer ([place])   . BPH (benign prostatic hyperplasia)   . Bradycardia   . CAD (coronary artery disease)   . Chronic low back pain   . DDD (degenerative disc disease), lumbosacral   . Depression   . Diverticulosis of colon (without mention of hemorrhage)   . Dyspnea   . Essential tremor   . Fatty liver disease, nonalcoholic   . GERD (gastroesophageal reflux disease)   . Glaucoma   . History of bladder cancer    2004--  TCC low-grade , non-invasive  . History of colon polyps   . History of exercise stress test    02-05-2007-- ETT (Clinically positive, electrically equivocal, submaximal ETT,  appropriate bp response to exercise  . History of hiatal hernia   . History of kidney stones   . History of peptic ulcer   . HLD (hyperlipidemia)   . HTN (hypertension)   . Nocturia   . Productive cough    . Rheumatoid arthritis(714.0)   . Right renal artery stenosis ([facility])    per cath 03-14-2009 --- 40-50%  . RLS (restless legs syndrome)   . Vertigo, intermittent   . Weak urinary stream   . Wears dentures    upper  . Wears glasses   . Wears hearing aid    bilateral  . Wears partial dentures    lower    Past Surgical History:  Procedure Laterality Date  . CARDIAC CATHETERIZATION  02-12-2007  dr gregg taylor   Non-obstructive CAD,  20% pLCX,  20% LAD, ef 65%  . CARDIAC CATHETERIZATION  03-14-2009  dr Johnsie Cancel   pLAD and mid diaginal 20-30% multiple lesions,  OM1 20%, right renal ostial 40-50%,  ef 60%  . CATARACT EXTRACTION W/ INTRAOCULAR LENS  IMPLANT, BILATERAL  2016  . CYSTOSCOPY WITH BIOPSY N/A 07/11/2015   Procedure: CYSTOSCOPY WITH COLD CUP RESECTION AND FULGERATION;  Surgeon: Rana Snare, MD;  Location: [facility];  Service: Urology;  Laterality: N/A;  . HIP ARTHROSCOPY W/ LABRAL DEBRIDEMENT Left 06-29-2000   and chondraplasty  . LUMBAR [place] SURGERY  07-16-2005  . LUMBAR LAMINECTOMY/DECOMPRESSION MICRODISCECTOMY  07/09/2011   Procedure: LUMBAR LAMINECTOMY/DECOMPRESSION MICRODISCECTOMY;  Surgeon: Johnn Hai;  Location: [facility];  Service: Orthopedics;  Laterality: Left;  Decompression L5 - S1 on the Left and repair of dura  . LUMBAR LAMINECTOMY/DECOMPRESSION MICRODISCECTOMY N/A 07/27/2013   Procedure: DECOMPRESSION L4 - L5 WITH EXCISION OF SYNOVIAL CYST AND, LATERAL MASS FUSION 1 LEVEL;  Surgeon: Johnn Hai, MD;  Location: [facility];  Service: Orthopedics;  Laterality: N/A;  . LUMBAR LAMINECTOMY/DECOMPRESSION MICRODISCECTOMY Left 07/06/2014   Procedure:  MICRO LUMBAR DECOMPRESSION L5-S1 ON LEFT, L4-5 REDO;  Surgeon: Johnn Hai, MD;  Location: [facility];  Service: Orthopedics;  Laterality: Left;  . SPINAL CORD STIMULATOR INSERTION N/A 09/11/2016   Procedure: Spinal cord stimulator placement;  Surgeon: Melina Schools, MD;  Location: [place];  Service: Orthopedics;   Laterality: N/A;  . TONSILLECTOMY AND ADENOIDECTOMY    . TRANSTHORACIC ECHOCARDIOGRAM  09-20-2010   normal LVF, ef 55-65%/  mild MR, TR and PR/  mild LAE  . TRANSURETHRAL RESECTION OF BLADDER TUMOR  09-21-2002    Family History  Problem Relation Age of Onset  .  Heart attack Mother   . Cancer Father        lung  . Cancer Sister        lung  . Diabetes Sister   . Cancer Brother        lung that spread to brain    Social History   Tobacco Use  . Smoking status: Former Smoker    Packs/day: 2.00    Years: 20.00    Pack years: 40.00    Types: Cigarettes    Quit date: 10/08/1982    Years since quitting: 36.8  . Smokeless tobacco: Never Used  Substance Use Topics  . Alcohol use: Yes    Comment: rare wine or beer occassionally    Subjective:    I connected with [place] on 07/18/19 at  3:40 PM EST by a telephone call  and verified that I am speaking with the correct person using two identifiers. Provider in office/ patient is at home; provider and patient are only 2 people on call.     I discussed the limitations of evaluation and management by telemedicine and the availability of in person appointments. The patient expressed understanding and agreed to proceed.  Patient complaining of persisting cough/ congestion/ fatigue x 1 week; denies any fever or shortness of breath; denies any known exposure to COVID or concerns for exposure. Has used family member's Best boy and would like a prescription if if possible; Does have underlying asthma/ COPD- has been having to use his rescue inhaler more frequently lately;    Objective:  There were no vitals filed for this visit.  Lungs: Respirations unlabored;  Neurologic: Alert and oriented; speech intact;   Assessment:  1. Acute bronchitis, unspecified organism     Plan:  Rx for Doxycycline 100 mg bid x 10 days and Tessalon Perles tid as requested; increase fluids, rest and follow-up worse, no better; may need COVID testing  and/or CXR if symptoms persist.   Time spent 12 minutes  No follow-ups on file.  No orders of the defined types were placed in this encounter.   Requested Prescriptions   Signed Prescriptions Disp Refills  . benzonatate (TESSALON) 100 MG capsule 30 capsule 0    Sig: Take 1 capsule (100 mg total) by mouth 3 (three) times daily as needed.  . doxycycline (VIBRA-TABS) 100 MG tablet 20 tablet 0    Sig: Take 1 tablet (100 mg total) by mouth 2 (two) times daily.

## 2019-07-21 ENCOUNTER — Telehealth: Payer: Self-pay | Admitting: Internal Medicine

## 2019-07-21 ENCOUNTER — Ambulatory Visit: Payer: Medicare Other | Attending: Internal Medicine

## 2019-07-21 DIAGNOSIS — Z20822 Contact with and (suspected) exposure to covid-19: Secondary | ICD-10-CM

## 2019-07-21 NOTE — Telephone Encounter (Signed)
Pt's daughter in law stated she and her husband tested positive for Covid 19 and pt had started exhibiting symptoms of cough, stuffy nose, congestion. Had an appt on 07/17/18 with Jodi Mourning and was given mediation. He is getting tested today but has now developed diarrhea, vomiting and dehydration. He has not been able to keep anything down. Pt was not with daughter in law to speak with NT. They would like to know what Dr. Jenny Reichmann recommends and they are very concerned due to his symptoms and age. Please advise. Requesting CB. Can contact pt.

## 2019-07-22 ENCOUNTER — Emergency Department (HOSPITAL_COMMUNITY)
Admission: EM | Admit: 2019-07-22 | Discharge: 2019-07-22 | Disposition: A | Discharge status: Home / Self Care | Payer: Medicare Other | Attending: Emergency Medicine | Admitting: Emergency Medicine

## 2019-07-22 ENCOUNTER — Encounter (HOSPITAL_COMMUNITY): Payer: Self-pay | Admitting: Emergency Medicine

## 2019-07-22 ENCOUNTER — Emergency Department (HOSPITAL_COMMUNITY): Payer: Medicare Other

## 2019-07-22 ENCOUNTER — Other Ambulatory Visit: Payer: Self-pay

## 2019-07-22 DIAGNOSIS — R509 Fever, unspecified: Secondary | ICD-10-CM | POA: Diagnosis not present

## 2019-07-22 DIAGNOSIS — N183 Chronic kidney disease, stage 3 unspecified: Secondary | ICD-10-CM | POA: Insufficient documentation

## 2019-07-22 DIAGNOSIS — Z87891 Personal history of nicotine dependence: Secondary | ICD-10-CM | POA: Diagnosis not present

## 2019-07-22 DIAGNOSIS — U071 COVID-19: Secondary | ICD-10-CM | POA: Diagnosis not present

## 2019-07-22 DIAGNOSIS — Z91040 Latex allergy status: Secondary | ICD-10-CM | POA: Diagnosis not present

## 2019-07-22 DIAGNOSIS — R5383 Other fatigue: Secondary | ICD-10-CM | POA: Diagnosis not present

## 2019-07-22 DIAGNOSIS — Z79899 Other long term (current) drug therapy: Secondary | ICD-10-CM | POA: Insufficient documentation

## 2019-07-22 DIAGNOSIS — I130 Hypertensive heart and chronic kidney disease with heart failure and stage 1 through stage 4 chronic kidney disease, or unspecified chronic kidney disease: Secondary | ICD-10-CM | POA: Insufficient documentation

## 2019-07-22 DIAGNOSIS — R197 Diarrhea, unspecified: Secondary | ICD-10-CM | POA: Insufficient documentation

## 2019-07-22 DIAGNOSIS — R05 Cough: Secondary | ICD-10-CM | POA: Diagnosis not present

## 2019-07-22 DIAGNOSIS — R112 Nausea with vomiting, unspecified: Secondary | ICD-10-CM | POA: Diagnosis not present

## 2019-07-22 DIAGNOSIS — I509 Heart failure, unspecified: Secondary | ICD-10-CM | POA: Diagnosis not present

## 2019-07-22 DIAGNOSIS — R630 Anorexia: Secondary | ICD-10-CM | POA: Diagnosis not present

## 2019-07-22 DIAGNOSIS — R531 Weakness: Secondary | ICD-10-CM | POA: Diagnosis not present

## 2019-07-22 DIAGNOSIS — I2510 Atherosclerotic heart disease of native coronary artery without angina pectoris: Secondary | ICD-10-CM | POA: Insufficient documentation

## 2019-07-22 DIAGNOSIS — R0602 Shortness of breath: Secondary | ICD-10-CM | POA: Diagnosis not present

## 2019-07-22 LAB — URINALYSIS, ROUTINE W REFLEX MICROSCOPIC
Bilirubin Urine: NEGATIVE
Glucose, UA: NEGATIVE mg/dL
Hgb urine dipstick: NEGATIVE
Ketones, ur: 5 mg/dL — AB
Leukocytes,Ua: NEGATIVE
Nitrite: NEGATIVE
Protein, ur: 30 mg/dL — AB
Specific Gravity, Urine: 1.03 (ref 1.005–1.030)
pH: 5 (ref 5.0–8.0)

## 2019-07-22 LAB — CBC
HCT: 44.7 % (ref 39.0–52.0)
Hemoglobin: 15 g/dL (ref 13.0–17.0)
MCH: 33.5 pg (ref 26.0–34.0)
MCHC: 33.6 g/dL (ref 30.0–36.0)
MCV: 99.8 fL (ref 80.0–100.0)
Platelets: 276 10*3/uL (ref 150–400)
RBC: 4.48 MIL/uL (ref 4.22–5.81)
RDW: 13.7 % (ref 11.5–15.5)
WBC: 5.7 10*3/uL (ref 4.0–10.5)
nRBC: 0 % (ref 0.0–0.2)

## 2019-07-22 LAB — COMPREHENSIVE METABOLIC PANEL
ALT: 26 U/L (ref 0–44)
AST: 29 U/L (ref 15–41)
Albumin: 4 g/dL (ref 3.5–5.0)
Alkaline Phosphatase: 95 U/L (ref 38–126)
Anion gap: 12 (ref 5–15)
BUN: 20 mg/dL (ref 8–23)
CO2: 24 mmol/L (ref 22–32)
Calcium: 9.2 mg/dL (ref 8.9–10.3)
Chloride: 101 mmol/L (ref 98–111)
Creatinine, Ser: 1.07 mg/dL (ref 0.61–1.24)
GFR calc Af Amer: >60 mL/min (ref >60)
GFR calc non Af Amer: >60 mL/min (ref >60)
Glucose, Bld: 135 mg/dL — ABNORMAL HIGH (ref 70–99)
Potassium: 3.9 mmol/L (ref 3.5–5.1)
Sodium: 137 mmol/L (ref 135–145)
Total Bilirubin: 0.9 mg/dL (ref 0.3–1.2)
Total Protein: 7.6 g/dL (ref 6.5–8.1)

## 2019-07-22 LAB — SARS CORONAVIRUS 2 (TAT 6-24 HRS): SARS Coronavirus 2: POSITIVE — AB

## 2019-07-22 LAB — LIPASE, BLOOD: Lipase: 45 U/L (ref 11–51)

## 2019-07-22 MED ORDER — LACTATED RINGERS IV BOLUS
1000.0000 mL | Freq: Once | INTRAVENOUS | Status: AC
  Administered 2019-07-22: 1000 mL via INTRAVENOUS

## 2019-07-22 MED ORDER — ONDANSETRON HCL 4 MG PO TABS: 4.0000 mg | ORAL_TABLET | Freq: Three times a day (TID) | ORAL | 2 refills | Status: DC | PRN

## 2019-07-22 MED ORDER — ONDANSETRON HCL 4 MG/2ML IJ SOLN
4.0000 mg | Freq: Once | INTRAMUSCULAR | Status: AC
  Administered 2019-07-22: 4 mg via INTRAVENOUS
  Filled 2019-07-22: qty 2

## 2019-07-22 NOTE — Telephone Encounter (Signed)
Patient daughter in law called back because she didn't receive a phone yesterday . She is wanting to speak with Dr Dorris Fetch or his nurse and soon as possible .    Call back [phone]  [name]

## 2019-07-22 NOTE — ED Notes (Addendum)
This Probation officer was told by the patient's current RN that the family member called and reported a black staff member watched the patient fall to the ground and would not help him. This Probation officer informed that RN that she went to check on the patient after hearing him call out, but being unable to understand what he was saying. When she opened that door she saw the patient on the floor holding the call bell. The wheelchair, he was previously in, was facing the door. He was laying on his right side with his head behind the back wheel of the wheel chair. Both arm rests on the wheel chair were down and the chair was in an upright position. When the RN asked what happened, he told her he thinks he fell out of the chair because he was lightheaded, but didn't know. The RN then asked if he was able to get up. The patient then got up form the floor and sat back in the chair. No assistance was need. The RN then reinforced the need of the patient to stay in the chair. She then made the RN that had triaged him aware of the incident.

## 2019-07-22 NOTE — Telephone Encounter (Signed)
His symptoms are certainly concerning, There is no specific tx for now, except for rest, fluids, tylenol and immodium AD prn  I will send a medication for nausea though  Pt will need to go to ED for worsening signs of dehydration or sob

## 2019-07-22 NOTE — Telephone Encounter (Signed)
Per chart, patient is in [facility] ED now.

## 2019-07-22 NOTE — Discharge Instructions (Addendum)
You were seen in the emergency department today with generalized weakness.  We are testing you for COVID-19.  This test will come through in the MyChart app.  There is information in the discharge paperwork about how to set this app up on your phone if you do not already have it.  Please remain in isolation until your test results come back and you are feeling better for at least 3 days.  If you develop new or worsening symptoms you should return to the emergency department.

## 2019-07-22 NOTE — ED Provider Notes (Signed)
[facility] DEPT Provider Note   CSN: [id number] Arrival date & time: 07/22/19  1208     History Chief Complaint  Patient presents with  . Cough  . Diarrhea    [name] is a 84 y.o. male.  HPI   84 year old male with generalized weakness.  Symptoms started about a week ago.  Poor appetite.  Fatigue.  Intermittent nausea.  Some vomiting and diarrhea.  No reported fever.  Denies any acute pain.  No specific urinary symptoms.  No sick contacts that he is aware of.  Occasional dry cough.  Denies feeling short of breath.  He is primarily concerned about how fatigued he feels.  Denies any focal weakness.  Past Medical History:  Diagnosis Date  . Acute bronchitis per pt no fever--  cough since discharge from hospital 07-02-2015   per pcp note 07-05-2015  mild to moderate bronchitis vs pna  . Allergic rhinitis   . Bladder cancer ([place])   . BPH (benign prostatic hyperplasia)   . Bradycardia   . CAD (coronary artery disease)   . Chronic low back pain   . DDD (degenerative disc disease), lumbosacral   . Depression   . Diverticulosis of colon (without mention of hemorrhage)   . Dyspnea   . Essential tremor   . Fatty liver disease, nonalcoholic   . GERD (gastroesophageal reflux disease)   . Glaucoma   . History of bladder cancer    2004--  TCC low-grade , non-invasive  . History of colon polyps   . History of exercise stress test    02-05-2007-- ETT (Clinically positive, electrically equivocal, submaximal ETT,  appropriate bp response to exercise  . History of hiatal hernia   . History of kidney stones   . History of peptic ulcer   . HLD (hyperlipidemia)   . HTN (hypertension)   . Nocturia   . Productive cough   . Rheumatoid arthritis(714.0)   . Right renal artery stenosis (HCC)    per cath 03-14-2009 --- 40-50%  . RLS (restless legs syndrome)   . Vertigo, intermittent   . Weak urinary stream   . Wears dentures    upper  . Wears glasses     . Wears hearing aid    bilateral  . Wears partial dentures    lower    Patient Active Problem List   Diagnosis Date Noted  . Memory loss 06/24/2019  . Wheezing 06/24/2019  . Abrasion of left arm 04/11/2019  . Joint swelling of lower leg 11/29/2018  . Abnormal urinalysis 02/25/2018  . Anxiety 01/08/2018  . Laceration of hand 12/18/2017  . Pain of left hand 12/18/2017  . Laceration of scalp without foreign body 06/09/2017  . Nonintractable headache 06/09/2017  . Acute sinus infection 04/14/2017  . Nausea and vomiting 03/27/2017  . Hyponatremia 03/17/2017  . Right lumbar radiculopathy 01/21/2017  . Fall 01/21/2017  . Synovial cyst 01/21/2017  . Chronic pain 09/11/2016  . Generalized weakness 05/22/2016  . UTI (urinary tract infection) 05/22/2016  . Weakness 05/22/2016  . Fever blister 10/18/2015  . Dyspnea 10/17/2015  . Bradycardia 09/17/2015  . Chest pain at rest 09/17/2015  . CAD in native artery 09/17/2015  . CHF (congestive heart failure) ([place]) 09/17/2015  . Pulmonary hypertension ([place]) 09/17/2015  . Bladder cancer ([place]) 09/17/2015  . Essential tremor 09/17/2015  . Chest pain 09/17/2015  . Anginal chest pain at rest [facility]) 09/17/2015  . Hypokalemia 07/13/2015  . Vertigo 07/01/2015  .  Dizzinesses 07/01/2015  . Spinal stenosis of lumbar region at multiple levels 07/06/2014  . Angular cheilitis 05/05/2014  . Left lumbar radiculopathy 05/04/2014  . Right elbow pain 12/29/2013  . Lateral epicondylitis of right elbow 12/29/2013  . Cough 09/14/2013  . Spinal stenosis, lumbar 07/27/2013  . Palpable mass of neck 07/21/2013  . Vasovagal episode 01/10/2013  . Left otitis media 10/28/2012  . Impaired glucose tolerance 10/28/2012  . Cellulitis 04/24/2012  . Hemorrhoids 04/24/2012  . Syncope 04/21/2012  . Preventative health care 11/28/2010  . Depression 09/12/2010  . RENAL ARTERY STENOSIS 09/12/2010  . FATIGUE 09/12/2010  . BUNION, RIGHT FOOT 02/05/2010  . RECTAL  BLEEDING 07/10/2009  . Diarrhea 07/10/2009  . DIVERTICULOSIS OF COLON 03/28/2009  . FATTY LIVER DISEASE 03/28/2009  . BRADYCARDIA 03/20/2009  . DIZZINESS 10/17/2008  . CHEST PAIN 10/17/2008  . Malignant neoplasm of bladder ([place]) 02/07/2008  . Allergic rhinitis 02/07/2008  . COLONIC POLYPS, HX OF 02/07/2008  . Hyperlipidemia 11/24/2007  . Essential hypertension 11/24/2007  . GERD 11/24/2007  . HIATAL HERNIA WITH REFLUX 11/24/2007  . CKD (chronic kidney disease) stage 3, GFR 30-59 ml/min 11/24/2007  . Rheumatoid arthritis ([place]) 11/24/2007  . [place] DISEASE, LUMBAR 11/24/2007  . ADENOIDECTOMY, HX OF 11/24/2007    Past Surgical History:  Procedure Laterality Date  . CARDIAC CATHETERIZATION  02-12-2007  dr gregg taylor   Non-obstructive CAD,  20% pLCX,  20% LAD, ef 65%  . CARDIAC CATHETERIZATION  03-14-2009  dr Johnsie Cancel   pLAD and mid diaginal 20-30% multiple lesions,  OM1 20%, right renal ostial 40-50%,  ef 60%  . CATARACT EXTRACTION W/ INTRAOCULAR LENS  IMPLANT, BILATERAL  2016  . CYSTOSCOPY WITH BIOPSY N/A 07/11/2015   Procedure: CYSTOSCOPY WITH COLD CUP RESECTION AND FULGERATION;  Surgeon: Rana Snare, MD;  Location: [facility];  Service: Urology;  Laterality: N/A;  . HIP ARTHROSCOPY W/ LABRAL DEBRIDEMENT Left 06-29-2000   and chondraplasty  . LUMBAR [place] SURGERY  07-16-2005  . LUMBAR LAMINECTOMY/DECOMPRESSION MICRODISCECTOMY  07/09/2011   Procedure: LUMBAR LAMINECTOMY/DECOMPRESSION MICRODISCECTOMY;  Surgeon: Johnn Hai;  Location: [facility];  Service: Orthopedics;  Laterality: Left;  Decompression L5 - S1 on the Left and repair of dura  . LUMBAR LAMINECTOMY/DECOMPRESSION MICRODISCECTOMY N/A 07/27/2013   Procedure: DECOMPRESSION L4 - L5 WITH EXCISION OF SYNOVIAL CYST AND, LATERAL MASS FUSION 1 LEVEL;  Surgeon: Johnn Hai, MD;  Location: [facility];  Service: Orthopedics;  Laterality: N/A;  . LUMBAR LAMINECTOMY/DECOMPRESSION MICRODISCECTOMY Left 07/06/2014    Procedure:  MICRO LUMBAR DECOMPRESSION L5-S1 ON LEFT, L4-5 REDO;  Surgeon: Johnn Hai, MD;  Location: [facility];  Service: Orthopedics;  Laterality: Left;  . SPINAL CORD STIMULATOR INSERTION N/A 09/11/2016   Procedure: Spinal cord stimulator placement;  Surgeon: Melina Schools, MD;  Location: [facility];  Service: Orthopedics;  Laterality: N/A;  . TONSILLECTOMY AND ADENOIDECTOMY    . TRANSTHORACIC ECHOCARDIOGRAM  09-20-2010   normal LVF, ef 55-65%/  mild MR, TR and PR/  mild LAE  . TRANSURETHRAL RESECTION OF BLADDER TUMOR  09-21-2002      Family History  Problem Relation Age of Onset  . Heart attack Mother   . Cancer Father        lung  . Cancer Sister        lung  . Diabetes Sister   . Cancer Brother        lung that spread to brain    Social History   Tobacco Use  .  Smoking status: Former Smoker    Packs/day: 2.00    Years: 20.00    Pack years: 40.00    Types: Cigarettes    Quit date: 10/08/1982    Years since quitting: 36.8  . Smokeless tobacco: Never Used  Substance Use Topics  . Alcohol use: Yes    Comment: rare wine or beer occassionally  . Drug use: No    Home Medications Prior to Admission medications   Medication Sig Start Date End Date Taking? Authorizing Provider  acetaminophen (TYLENOL) 650 MG CR tablet Take 1,300 mg by mouth at bedtime as needed for pain.   Yes [provider]  albuterol (PROVENTIL HFA;VENTOLIN HFA) 108 (90 Base) MCG/ACT inhaler Inhale 2 puffs into the lungs every 6 (six) hours as needed for wheezing or shortness of breath. 11/18/17  Yes Biagio Borg, MD  amLODipine (NORVASC) 10 MG tablet TAKE 1 TABLET (10 MG TOTAL) DAILY. 05/09/19  Yes Biagio Borg, MD  benzonatate (TESSALON) 100 MG capsule Take 1 capsule (100 mg total) by mouth 3 (three) times daily as needed. Patient taking differently: Take 100 mg by mouth 3 (three) times daily as needed for cough.  07/18/19  Yes Marrian Salvage, FNP  brimonidine (ALPHAGAN) 0.15 % ophthalmic  solution Place 1 drop into both eyes 2 (two) times daily.   Yes [provider]  cetirizine (ZYRTEC) 10 MG tablet Take 10 mg by mouth daily.   Yes [provider]  citalopram (CELEXA) 10 MG tablet TAKE 1 TABLET EVERY MORNING 01/31/19  Yes Biagio Borg, MD  clonazePAM (KLONOPIN) 0.5 MG tablet Take 0.5 mg by mouth daily as needed for anxiety.    Yes [provider]  clotrimazole (LOTRIMIN) 1 % cream Apply 1 application topically 2 (two) times daily as needed (FOR RASH). Prescribed for rash in groin   Yes [provider]  dextromethorphan-guaiFENesin (MUCINEX DM) 30-600 MG 12hr tablet Take 1 tablet by mouth 2 (two) times daily.   Yes [provider]  doxycycline (VIBRA-TABS) 100 MG tablet Take 1 tablet (100 mg total) by mouth 2 (two) times daily. 07/18/19  Yes Marrian Salvage, FNP  fluticasone (FLONASE) 50 MCG/ACT nasal spray USE 2 SPRAYS IN EACH NOSTRIL EVERY DAY Patient taking differently: Place 2 sprays into both nostrils daily as needed for allergies.  11/08/18  Yes Biagio Borg, MD  gabapentin (NEURONTIN) 300 MG capsule TAKE 1 TO 2 CAPSULES FOUR TIMES DAILY  AFTER MEALS AND AT BEDTIME. Patient taking differently: Take 300-600 mg by mouth at bedtime.  06/01/19  Yes Biagio Borg, MD  hydrALAZINE (APRESOLINE) 10 MG tablet TAKE 1 TABLET THREE TIMES DAILY AS NEEDED FOR BLOOD PRESSURE MORE THAN 150/90 01/19/19  Yes Biagio Borg, MD  hydrochlorothiazide (HYDRODIURIL) 25 MG tablet TAKE 0.5 TABLETS (12.5 MG TOTAL) BY MOUTH DAILY. Patient taking differently: Take 25 mg by mouth daily.  12/07/18  Yes Biagio Borg, MD  HYDROcodone-acetaminophen (NORCO/VICODIN) 5-325 MG tablet Take 1 tablet by mouth every 6 (six) hours as needed for severe pain. 02/15/19  Yes Plunkett, Loree Fee, MD  hydroxypropyl methylcellulose / hypromellose (ISOPTO TEARS / GONIOVISC) 2.5 % ophthalmic solution Place 1 drop into both eyes 2 (two) times daily.   Yes [provider]    lidocaine (LIDODERM) 5 % Place 1 patch onto the skin daily. Remove & Discard patch within 12 hours or as directed by MD Patient taking differently: Place 1 patch onto the skin daily as needed (back pain). Remove &  Discard patch within 12 hours or as directed by MD 02/15/19  Yes Blanchie Dessert, MD  losartan (COZAAR) 100 MG tablet TAKE 1 TABLET EVERY DAY 12/09/18  Yes Biagio Borg, MD  meclizine (ANTIVERT) 25 MG tablet TAKE 1 TABLET TWICE DAILY AS NEEDED FOR DIZZINESS Patient taking differently: Take 25 mg by mouth 3 (three) times daily as needed for dizziness.  12/09/18  Yes Biagio Borg, MD  methocarbamol (ROBAXIN) 500 MG tablet Take 500 mg by mouth 3 (three) times daily as needed for muscle spasms.   Yes [provider]  nitroGLYCERIN (NITROSTAT) 0.4 MG SL tablet PLACE 1 TABLET UNDER THE TONGUE EVERY 5 MINUTES AS NEEDED FOR CHEST PAIN. MAX 3 DOSES. CALL 911 IF NO IMPROVEMENT AFTER 1ST DOSE Patient taking differently: Place 0.4 mg under the tongue every 5 (five) minutes as needed for chest pain.  02/01/18  Yes Biagio Borg, MD  Nutritional Supplements (JUICE PLUS FIBRE PO) Take 1 each 2 (two) times daily by mouth.   Yes [provider]  omeprazole (PRILOSEC) 40 MG capsule TAKE 1 CAPSULE TWICE DAILY 04/20/18  Yes Irene Shipper, MD  ondansetron (ZOFRAN) 4 MG tablet Take 1 tablet (4 mg total) by mouth every 8 (eight) hours as needed for nausea or vomiting. 07/22/19  Yes Biagio Borg, MD  potassium chloride SA (KLOR-CON) 20 MEQ tablet Take 20 mEq by mouth daily.   Yes [provider]  primidone (MYSOLINE) 250 MG tablet TAKE 1/2 TABLET THREE TIMES DAILY 01/24/19  Yes Biagio Borg, MD  psyllium (METAMUCIL SMOOTH TEXTURE) 58.6 % powder Take 2 tablespoons daily. Drink 12 oz of water 02/25/18  Yes Irene Shipper, MD  simvastatin (ZOCOR) 40 MG tablet TAKE 1 AND 1/2 TABLETS AT BEDTIME 04/29/19  Yes Biagio Borg, MD  tamsulosin (FLOMAX) 0.4 MG CAPS capsule Take 1 capsule (0.4 mg total)  by mouth daily. Patient taking differently: Take 0.8 mg by mouth at bedtime.  05/17/15  Yes Biagio Borg, MD  timolol (TIMOPTIC) 0.5 % ophthalmic solution Place 1 drop into both eyes 2 (two) times daily.  05/30/15  Yes [provider]  Travoprost, BAK Free, (TRAVATAN Z) 0.004 % SOLN ophthalmic solution Place 1 drop into both eyes at bedtime. 09/14/13  Yes Biagio Borg, MD  triamcinolone (KENALOG) 0.025 % cream Apply 1 application topically 2 (two) times daily as needed (rash).    Yes [provider]  budesonide-formoterol (SYMBICORT) 160-4.5 MCG/ACT inhaler Inhale 2 puffs into the lungs 2 (two) times daily. 06/24/19   Biagio Borg, MD  potassium chloride (KLOR-CON) 10 MEQ tablet TAKE 2 TABLETS EVERY DAY Patient not taking: Reported on 07/22/2019 07/14/19   Biagio Borg, MD  Vitamin D, Ergocalciferol, (DRISDOL) 1.25 MG (50000 UT) CAPS capsule Take 1 capsule (50,000 Units total) by mouth every 7 (seven) days. Patient not taking: Reported on 07/22/2019 06/27/19   Biagio Borg, MD    Allergies    Morphine and related, Hydrochlorothiazide, Tape, Sulfur dioxide, Latex, Lisinopril, and Sulfa antibiotics  Review of Systems   Review of Systems All systems reviewed and negative, other than as noted in HPI.  Physical Exam Updated Vital Signs BP (!) 151/84   Pulse 69   Temp 98.2 F (36.8 C) (Oral)   Resp (!) 23   SpO2 95%   Physical Exam Vitals and nursing note reviewed.  Constitutional:      General: He is not in acute distress.    Appearance:  He is well-developed.  HENT:     Head: Normocephalic and atraumatic.  Eyes:     General:        Right eye: No discharge.        Left eye: No discharge.     Conjunctiva/sclera: Conjunctivae normal.  Cardiovascular:     Rate and Rhythm: Normal rate and regular rhythm.     Heart sounds: Normal heart sounds. No murmur. No friction rub. No gallop.   Pulmonary:     Effort: Pulmonary effort is normal. No respiratory distress.      Breath sounds: Normal breath sounds.  Abdominal:     General: There is no distension.     Palpations: Abdomen is soft.     Tenderness: There is no abdominal tenderness.  Musculoskeletal:        General: No tenderness.     Cervical back: Neck supple.  Skin:    General: Skin is warm and dry.  Neurological:     Mental Status: He is alert.  Psychiatric:        Behavior: Behavior normal.        Thought Content: Thought content normal.     ED Results / Procedures / Treatments   Labs (all labs ordered are listed, but only abnormal results are displayed) Labs Reviewed  SARS CORONAVIRUS 2 (TAT 6-24 HRS) - Abnormal; Notable for the following components:      Result Value   SARS Coronavirus 2 POSITIVE (*)    All other components within normal limits  COMPREHENSIVE METABOLIC PANEL - Abnormal; Notable for the following components:   Glucose, Bld 135 (*)    All other components within normal limits  URINALYSIS, ROUTINE W REFLEX MICROSCOPIC - Abnormal; Notable for the following components:   Color, Urine AMBER (*)    Ketones, ur 5 (*)    Protein, ur 30 (*)    Bacteria, UA RARE (*)    All other components within normal limits  LIPASE, BLOOD  CBC    EKG EKG Interpretation  Date/Time:  Friday July 22 2019 12:18:39 EST Ventricular Rate:  72 PR Interval:    QRS Duration: 88 QT Interval:  366 QTC Calculation: 401 R Axis:   43 Text Interpretation: Sinus rhythm Repol abnrm, multiple leads Confirmed by Virgel Manifold [phone]) on 07/22/2019 1:33:16 PM   Radiology DG Chest 2 View  Result Date: 07/22/2019 CLINICAL DATA:  Cough, shortness of breath and fever since 07/08/2019. EXAM: CHEST - 2 VIEW COMPARISON:  Single-view of the chest 02/15/2019. CT chest 02/24/2019. FINDINGS: Mild subsegmental atelectasis is seen in the left lung base. The lungs are otherwise clear. Emphysematous disease is noted but better seen on the prior CT. Heart size is normal. Atherosclerotic vascular disease  identified. No acute or focal bony abnormality. Spinal stimulator is in place. IMPRESSION: No acute disease. Emphysema. Atherosclerosis. Electronically Signed   By: Inge Rise M.D.   On: 07/22/2019 12:46    Procedures Procedures (including critical care time)  Medications Ordered in ED Medications  ondansetron (ZOFRAN) injection 4 mg (4 mg Intravenous Given 07/22/19 1412)  lactated ringers bolus 1,000 mL (1,000 mLs Intravenous New Bag/Given 07/22/19 1420)    ED Course  I have reviewed the triage vital signs and the nursing notes.  Pertinent labs & imaging results that were available during my care of the patient were reviewed by me and considered in my medical decision making (see chart for details).    MDM Rules/Calculators/A&P  84 year old male with generalized fatigue  and GI symptoms for about a week.  Potentially Covid.  Will test.  He is nontoxic in appearance.  No increased work of breathing.  Oxygen saturations are fine on room air.  Notices occasional cough but none observed in the room.  Mostly concerned about feeling weak.  Nonfocal neuro exam.  Abdomen is benign.  Treated symptomatically with nausea medication and IV fluids.  Given his age will also check urinalysis although he has no specific urinary symptoms.  Discussed with his daughter-in-law via telephone that overall his work-up is thus far been reassuring.  Even if he does end up testing positive for COVID he is still appropriate for symptomatic treatment as an outpatient.  JOSELITO CHOUDRY was evaluated in Emergency Department on 07/27/2019 for the symptoms described in the history of present illness. He was evaluated in the context of the global COVID-19 pandemic, which necessitated consideration that the patient might be at risk for infection with the SARS-CoV-2 virus that causes COVID-19. Institutional protocols and algorithms that pertain to the evaluation of patients at risk for COVID-19 are in a state of rapid change based  on information released by regulatory bodies including the CDC and federal and state organizations. These policies and algorithms were followed during the patient's care in the ED.   Final Clinical Impression(s) / ED Diagnoses Final diagnoses:  Generalized weakness    Rx / DC Orders ED Discharge Orders    None       Virgel Manifold, MD 07/27/19 [phone]

## 2019-07-22 NOTE — ED Provider Notes (Signed)
Blood pressure (!) 151/84, pulse 69, temperature 98.2 F (36.8 C), temperature source Oral, resp. rate (!) 23, SpO2 95 %.  Assuming care from Dr. Wilson Singer.  In short, [name] is a 84 y.o. male with a chief complaint of Cough and Diarrhea .  Refer to the original H&P for additional details.  The current plan of care is to follow up on UA and likely D/C.  08:40 PM  No UTI on UA. Plan for discharge with patient to f/u on COVID test results and stay home in isolation. Discussed ED return precautions. Prior EDP spoke with daughter regarding plan as well.    Margette Fast, MD 07/22/19 2041

## 2019-07-22 NOTE — ED Triage Notes (Signed)
Patient c/o cough, SOB, fever, and intermittent diarrhea and vomiting since 12/25. Reports taking abx and tessalon as prescribed but vomiting immediately following x3 days. Speaking in full sentences without difficulty.

## 2019-07-23 LAB — NOVEL CORONAVIRUS, NAA: SARS-CoV-2, NAA: DETECTED — AB

## 2019-07-25 ENCOUNTER — Other Ambulatory Visit: Payer: Self-pay | Admitting: Physician Assistant

## 2019-07-25 ENCOUNTER — Telehealth: Payer: Self-pay | Admitting: Physician Assistant

## 2019-07-25 NOTE — Telephone Encounter (Signed)
Patient was contacted regarding positive covid 19 result. Pt's symptoms include weakness, diarrhea, and shortness of breath (has chronic shortness of breath from COPD and asthma, but worse with Covid). He would be a good candidate for monoclonal antibody infusion based on his age and commodities, but unfortunately his symptoms onset was over 10 days ago based on a 07/18/19 PCP office note where he complained of cough x 1 week and was treated for bronchitis. We went over CDC guidelines for quarantine and ER precautions.   Angelena Form PA-C  [facility]

## 2019-08-01 ENCOUNTER — Ambulatory Visit: Payer: Medicare Other | Admitting: Podiatry

## 2019-08-05 ENCOUNTER — Other Ambulatory Visit: Payer: Self-pay | Admitting: Internal Medicine

## 2019-08-08 ENCOUNTER — Other Ambulatory Visit: Payer: Self-pay | Admitting: *Deleted

## 2019-08-08 MED ORDER — OMEPRAZOLE 40 MG PO CPDR: DELAYED_RELEASE_CAPSULE | ORAL | 3 refills | Status: DC

## 2019-08-09 ENCOUNTER — Other Ambulatory Visit: Payer: Self-pay

## 2019-08-09 ENCOUNTER — Ambulatory Visit (INDEPENDENT_AMBULATORY_CARE_PROVIDER_SITE_OTHER): Payer: Medicare Other | Admitting: Internal Medicine

## 2019-08-09 ENCOUNTER — Encounter: Payer: Self-pay | Admitting: Internal Medicine

## 2019-08-09 DIAGNOSIS — U071 COVID-19: Secondary | ICD-10-CM | POA: Diagnosis not present

## 2019-08-09 MED ORDER — BUDESONIDE-FORMOTEROL FUMARATE 160-4.5 MCG/ACT IN AERO: 2.0000 | INHALATION_SPRAY | Freq: Two times a day (BID) | RESPIRATORY_TRACT | 3 refills | Status: DC

## 2019-08-09 MED ORDER — ALBUTEROL SULFATE HFA 108 (90 BASE) MCG/ACT IN AERS: 2.0000 | INHALATION_SPRAY | Freq: Four times a day (QID) | RESPIRATORY_TRACT | 3 refills | Status: DC | PRN

## 2019-08-09 NOTE — Patient Instructions (Signed)
Please continue all other medications as before, and refills have been done if requested.  Please have the pharmacy call with any other refills you may need.  Please continue your efforts at being more active, low cholesterol diet, and weight control.  Please keep your appointments with your specialists as you may have planned     

## 2019-08-09 NOTE — Progress Notes (Signed)
Patient ID: [name], male   DOB: September 16, 1935, 84 y.o.   MRN: [id number]  [facility] visit  Cumulative time during 7-day interval 12 min, there was not an associated office visit for this concern within a 7 day period. Verbal consent for services obtained from patient prior to services given.  Names of all persons present for services: Cathlean Cower, MD, patient  Chief complaint: covid infection  History, background, results pertinent:  Here after seen in ED jan 8 with COVID + and weakness but fortunately o/w no cardioresp symptoms.  Did have N/V and significant diarrhea but fortunately able to keep up with the fluids.  Has had stable breathing on current inhaler and Pt denies chest pain, increased sob or doe, wheezing, orthopnea, PND, increased LE swelling, palpitations, dizziness or syncope.  Denies worsening reflux, dysphagia, or blood.  Plans to have the covid vaccine at the [place] soon.  Past Medical History:  Diagnosis Date  . Acute bronchitis per pt no fever--  cough since discharge from hospital 07-02-2015   per pcp note 07-05-2015  mild to moderate bronchitis vs pna  . Allergic rhinitis   . Bladder cancer ([place])   . BPH (benign prostatic hyperplasia)   . Bradycardia   . CAD (coronary artery disease)   . Chronic low back pain   . DDD (degenerative disc disease), lumbosacral   . Depression   . Diverticulosis of colon (without mention of hemorrhage)   . Dyspnea   . Essential tremor   . Fatty liver disease, nonalcoholic   . GERD (gastroesophageal reflux disease)   . Glaucoma   . History of bladder cancer    2004--  TCC low-grade , non-invasive  . History of colon polyps   . History of exercise stress test    02-05-2007-- ETT (Clinically positive, electrically equivocal, submaximal ETT,  appropriate bp response to exercise  . History of hiatal hernia   . History of kidney stones   . History of peptic ulcer   . HLD (hyperlipidemia)   . HTN (hypertension)   . Nocturia   . Productive  cough   . Rheumatoid arthritis(714.0)   . Right renal artery stenosis (HCC)    per cath 03-14-2009 --- 40-50%  . RLS (restless legs syndrome)   . Vertigo, intermittent   . Weak urinary stream   . Wears dentures    upper  . Wears glasses   . Wears hearing aid    bilateral  . Wears partial dentures    lower   No results found for this or any previous visit (from the past 48 hour(s)).  Current Outpatient Medications on File Prior to Visit  Medication Sig Dispense Refill  . acetaminophen (TYLENOL) 650 MG CR tablet Take 1,300 mg by mouth at bedtime as needed for pain.    Marland Kitchen amLODipine (NORVASC) 10 MG tablet TAKE 1 TABLET (10 MG TOTAL) DAILY. 90 tablet 1  . benzonatate (TESSALON) 100 MG capsule Take 1 capsule (100 mg total) by mouth 3 (three) times daily as needed. (Patient taking differently: Take 100 mg by mouth 3 (three) times daily as needed for cough. ) 30 capsule 0  . brimonidine (ALPHAGAN) 0.15 % ophthalmic solution Place 1 drop into both eyes 2 (two) times daily.    . cetirizine (ZYRTEC) 10 MG tablet Take 10 mg by mouth daily.    . citalopram (CELEXA) 10 MG tablet TAKE 1 TABLET EVERY MORNING 90 tablet 1  . clonazePAM (KLONOPIN) 0.5 MG tablet Take 0.5 mg  by mouth daily as needed for anxiety.     . clotrimazole (LOTRIMIN) 1 % cream Apply 1 application topically 2 (two) times daily as needed (FOR RASH). Prescribed for rash in groin    . dextromethorphan-guaiFENesin (MUCINEX DM) 30-600 MG 12hr tablet Take 1 tablet by mouth 2 (two) times daily.    Marland Kitchen doxycycline (VIBRA-TABS) 100 MG tablet Take 1 tablet (100 mg total) by mouth 2 (two) times daily. 20 tablet 0  . fluticasone (FLONASE) 50 MCG/ACT nasal spray USE 2 SPRAYS IN EACH NOSTRIL EVERY DAY (Patient taking differently: Place 2 sprays into both nostrils daily as needed for allergies. ) 48 g 2  . gabapentin (NEURONTIN) 300 MG capsule TAKE 1 TO 2 CAPSULES FOUR TIMES DAILY  AFTER MEALS AND AT BEDTIME. (Patient taking differently: Take 300-600  mg by mouth at bedtime. ) 720 capsule 3  . hydrALAZINE (APRESOLINE) 10 MG tablet TAKE 1 TABLET THREE TIMES DAILY AS NEEDED FOR BLOOD PRESSURE MORE THAN 150/90 270 tablet 1  . hydrochlorothiazide (HYDRODIURIL) 25 MG tablet TAKE 0.5 TABLETS (12.5 MG TOTAL) BY MOUTH DAILY. (Patient taking differently: Take 25 mg by mouth daily. ) 45 tablet 2  . HYDROcodone-acetaminophen (NORCO/VICODIN) 5-325 MG tablet Take 1 tablet by mouth every 6 (six) hours as needed for severe pain. 10 tablet 0  . hydroxypropyl methylcellulose / hypromellose (ISOPTO TEARS / GONIOVISC) 2.5 % ophthalmic solution Place 1 drop into both eyes 2 (two) times daily.    Marland Kitchen lidocaine (LIDODERM) 5 % Place 1 patch onto the skin daily. Remove & Discard patch within 12 hours or as directed by MD (Patient taking differently: Place 1 patch onto the skin daily as needed (back pain). Remove & Discard patch within 12 hours or as directed by MD) 30 patch 0  . losartan (COZAAR) 100 MG tablet TAKE 1 TABLET EVERY DAY 90 tablet 2  . meclizine (ANTIVERT) 25 MG tablet TAKE 1 TABLET TWICE DAILY AS NEEDED FOR DIZZINESS 180 tablet 1  . methocarbamol (ROBAXIN) 500 MG tablet Take 500 mg by mouth 3 (three) times daily as needed for muscle spasms.    . nitroGLYCERIN (NITROSTAT) 0.4 MG SL tablet PLACE 1 TABLET UNDER THE TONGUE EVERY 5 MINUTES AS NEEDED FOR CHEST PAIN. MAX 3 DOSES. CALL 911 IF NO IMPROVEMENT AFTER 1ST DOSE (Patient taking differently: Place 0.4 mg under the tongue every 5 (five) minutes as needed for chest pain. ) 25 tablet 0  . Nutritional Supplements (JUICE PLUS FIBRE PO) Take 1 each 2 (two) times daily by mouth.    Marland Kitchen omeprazole (PRILOSEC) 40 MG capsule TAKE 1 CAPSULE TWICE DAILY 180 capsule 3  . ondansetron (ZOFRAN) 4 MG tablet Take 1 tablet (4 mg total) by mouth every 8 (eight) hours as needed for nausea or vomiting. 30 tablet 2  . potassium chloride (KLOR-CON) 10 MEQ tablet TAKE 2 TABLETS EVERY DAY (Patient not taking: Reported on 07/22/2019) 180  tablet 1  . potassium chloride SA (KLOR-CON) 20 MEQ tablet Take 20 mEq by mouth daily.    . primidone (MYSOLINE) 250 MG tablet TAKE 1/2 TABLET THREE TIMES DAILY 135 tablet 0  . psyllium (METAMUCIL SMOOTH TEXTURE) 58.6 % powder Take 2 tablespoons daily. Drink 12 oz of water 283 g 12  . simvastatin (ZOCOR) 40 MG tablet TAKE 1 AND 1/2 TABLETS AT BEDTIME 135 tablet 1  . tamsulosin (FLOMAX) 0.4 MG CAPS capsule Take 1 capsule (0.4 mg total) by mouth daily. (Patient taking differently: Take 0.8 mg by mouth at bedtime. )  90 capsule 3  . timolol (TIMOPTIC) 0.5 % ophthalmic solution Place 1 drop into both eyes 2 (two) times daily.   1  . Travoprost, BAK Free, (TRAVATAN Z) 0.004 % SOLN ophthalmic solution Place 1 drop into both eyes at bedtime. 5 mL 5  . triamcinolone (KENALOG) 0.025 % cream Apply 1 application topically 2 (two) times daily as needed (rash).     . Vitamin D, Ergocalciferol, (DRISDOL) 1.25 MG (50000 UT) CAPS capsule Take 1 capsule (50,000 Units total) by mouth every 7 (seven) days. (Patient not taking: Reported on 07/22/2019) 12 capsule 0   No current facility-administered medications on file prior to visit.   Lab Results  Component Value Date   WBC 5.7 07/22/2019   HGB 15.0 07/22/2019   HCT 44.7 07/22/2019   PLT 276 07/22/2019   GLUCOSE 135 (H) 07/22/2019   CHOL 164 06/24/2019   TRIG 248.0 (H) 06/24/2019   HDL 48.80 06/24/2019   LDLDIRECT 86.0 06/24/2019   LDLCALC 65 10/14/2017   ALT 26 07/22/2019   AST 29 07/22/2019   NA 137 07/22/2019   K 3.9 07/22/2019   CL 101 07/22/2019   CREATININE 1.07 07/22/2019   BUN 20 07/22/2019   CO2 24 07/22/2019   TSH 3.78 06/24/2019   PSA 0.53 11/12/2016   INR 1.15 02/23/2018   HGBA1C 5.6 06/24/2019   A/P/next steps:   1)  COVID infection - with improving GI symptoms, and I sent his inhaler refills to the [facility] pharmacy in [place] at his request.  Cathlean Cower MD

## 2019-08-09 NOTE — Assessment & Plan Note (Signed)
See notes

## 2019-08-16 ENCOUNTER — Ambulatory Visit (INDEPENDENT_AMBULATORY_CARE_PROVIDER_SITE_OTHER): Payer: Medicare Other | Admitting: Family Medicine

## 2019-08-16 ENCOUNTER — Inpatient Hospital Stay (HOSPITAL_COMMUNITY)
Admission: EM | Admit: 2019-08-16 | Discharge: 2019-08-21 | DRG: 177 | Disposition: A | Discharge status: Home / Self Care | Payer: Medicare Other | Attending: Internal Medicine | Admitting: Internal Medicine

## 2019-08-16 ENCOUNTER — Emergency Department (HOSPITAL_COMMUNITY): Payer: Medicare Other

## 2019-08-16 ENCOUNTER — Other Ambulatory Visit: Payer: Self-pay | Admitting: Family Medicine

## 2019-08-16 ENCOUNTER — Other Ambulatory Visit: Payer: Self-pay

## 2019-08-16 VITALS — BP 152/86 | HR 73 | Temp 97.9°F | Ht 67.0 in | Wt 197.0 lb

## 2019-08-16 DIAGNOSIS — H409 Unspecified glaucoma: Secondary | ICD-10-CM | POA: Diagnosis present

## 2019-08-16 DIAGNOSIS — G8929 Other chronic pain: Secondary | ICD-10-CM | POA: Diagnosis present

## 2019-08-16 DIAGNOSIS — Z981 Arthrodesis status: Secondary | ICD-10-CM | POA: Diagnosis not present

## 2019-08-16 DIAGNOSIS — F329 Major depressive disorder, single episode, unspecified: Secondary | ICD-10-CM | POA: Diagnosis present

## 2019-08-16 DIAGNOSIS — J9600 Acute respiratory failure, unspecified whether with hypoxia or hypercapnia: Secondary | ICD-10-CM | POA: Diagnosis present

## 2019-08-16 DIAGNOSIS — J1282 Pneumonia due to coronavirus disease 2019: Secondary | ICD-10-CM | POA: Diagnosis present

## 2019-08-16 DIAGNOSIS — G250 Essential tremor: Secondary | ICD-10-CM | POA: Diagnosis not present

## 2019-08-16 DIAGNOSIS — C679 Malignant neoplasm of bladder, unspecified: Secondary | ICD-10-CM | POA: Diagnosis not present

## 2019-08-16 DIAGNOSIS — U071 COVID-19: Secondary | ICD-10-CM | POA: Diagnosis not present

## 2019-08-16 DIAGNOSIS — G2581 Restless legs syndrome: Secondary | ICD-10-CM | POA: Diagnosis present

## 2019-08-16 DIAGNOSIS — Z91048 Other nonmedicinal substance allergy status: Secondary | ICD-10-CM

## 2019-08-16 DIAGNOSIS — M069 Rheumatoid arthritis, unspecified: Secondary | ICD-10-CM | POA: Diagnosis present

## 2019-08-16 DIAGNOSIS — R0902 Hypoxemia: Secondary | ICD-10-CM

## 2019-08-16 DIAGNOSIS — N400 Enlarged prostate without lower urinary tract symptoms: Secondary | ICD-10-CM | POA: Diagnosis present

## 2019-08-16 DIAGNOSIS — Z91040 Latex allergy status: Secondary | ICD-10-CM

## 2019-08-16 DIAGNOSIS — J42 Unspecified chronic bronchitis: Secondary | ICD-10-CM

## 2019-08-16 DIAGNOSIS — R0989 Other specified symptoms and signs involving the circulatory and respiratory systems: Secondary | ICD-10-CM

## 2019-08-16 DIAGNOSIS — I2510 Atherosclerotic heart disease of native coronary artery without angina pectoris: Secondary | ICD-10-CM | POA: Diagnosis present

## 2019-08-16 DIAGNOSIS — Z8719 Personal history of other diseases of the digestive system: Secondary | ICD-10-CM

## 2019-08-16 DIAGNOSIS — J189 Pneumonia, unspecified organism: Secondary | ICD-10-CM

## 2019-08-16 DIAGNOSIS — R413 Other amnesia: Secondary | ICD-10-CM | POA: Diagnosis present

## 2019-08-16 DIAGNOSIS — K760 Fatty (change of) liver, not elsewhere classified: Secondary | ICD-10-CM | POA: Diagnosis present

## 2019-08-16 DIAGNOSIS — R0602 Shortness of breath: Secondary | ICD-10-CM

## 2019-08-16 DIAGNOSIS — R42 Dizziness and giddiness: Secondary | ICD-10-CM | POA: Diagnosis not present

## 2019-08-16 DIAGNOSIS — K449 Diaphragmatic hernia without obstruction or gangrene: Secondary | ICD-10-CM | POA: Diagnosis present

## 2019-08-16 DIAGNOSIS — Z9682 Presence of neurostimulator: Secondary | ICD-10-CM | POA: Diagnosis not present

## 2019-08-16 DIAGNOSIS — J9601 Acute respiratory failure with hypoxia: Secondary | ICD-10-CM | POA: Diagnosis not present

## 2019-08-16 DIAGNOSIS — R062 Wheezing: Secondary | ICD-10-CM | POA: Diagnosis not present

## 2019-08-16 DIAGNOSIS — Z885 Allergy status to narcotic agent status: Secondary | ICD-10-CM

## 2019-08-16 DIAGNOSIS — N183 Chronic kidney disease, stage 3 unspecified: Secondary | ICD-10-CM | POA: Diagnosis present

## 2019-08-16 DIAGNOSIS — I10 Essential (primary) hypertension: Secondary | ICD-10-CM | POA: Diagnosis present

## 2019-08-16 DIAGNOSIS — R001 Bradycardia, unspecified: Secondary | ICD-10-CM | POA: Diagnosis not present

## 2019-08-16 DIAGNOSIS — Z882 Allergy status to sulfonamides status: Secondary | ICD-10-CM

## 2019-08-16 DIAGNOSIS — K219 Gastro-esophageal reflux disease without esophagitis: Secondary | ICD-10-CM | POA: Diagnosis present

## 2019-08-16 DIAGNOSIS — Z7289 Other problems related to lifestyle: Secondary | ICD-10-CM

## 2019-08-16 DIAGNOSIS — F320 Major depressive disorder, single episode, mild: Secondary | ICD-10-CM | POA: Diagnosis not present

## 2019-08-16 DIAGNOSIS — J309 Allergic rhinitis, unspecified: Secondary | ICD-10-CM | POA: Diagnosis present

## 2019-08-16 DIAGNOSIS — Z974 Presence of external hearing-aid: Secondary | ICD-10-CM

## 2019-08-16 DIAGNOSIS — F419 Anxiety disorder, unspecified: Secondary | ICD-10-CM | POA: Diagnosis present

## 2019-08-16 DIAGNOSIS — R7303 Prediabetes: Secondary | ICD-10-CM | POA: Diagnosis present

## 2019-08-16 DIAGNOSIS — Z7951 Long term (current) use of inhaled steroids: Secondary | ICD-10-CM

## 2019-08-16 DIAGNOSIS — R55 Syncope and collapse: Secondary | ICD-10-CM | POA: Diagnosis present

## 2019-08-16 DIAGNOSIS — Z8249 Family history of ischemic heart disease and other diseases of the circulatory system: Secondary | ICD-10-CM

## 2019-08-16 DIAGNOSIS — Z8551 Personal history of malignant neoplasm of bladder: Secondary | ICD-10-CM

## 2019-08-16 DIAGNOSIS — M48061 Spinal stenosis, lumbar region without neurogenic claudication: Secondary | ICD-10-CM | POA: Diagnosis present

## 2019-08-16 DIAGNOSIS — E785 Hyperlipidemia, unspecified: Secondary | ICD-10-CM | POA: Diagnosis present

## 2019-08-16 DIAGNOSIS — F32A Depression, unspecified: Secondary | ICD-10-CM | POA: Diagnosis present

## 2019-08-16 DIAGNOSIS — I499 Cardiac arrhythmia, unspecified: Secondary | ICD-10-CM | POA: Diagnosis not present

## 2019-08-16 DIAGNOSIS — Z87891 Personal history of nicotine dependence: Secondary | ICD-10-CM

## 2019-08-16 DIAGNOSIS — Z888 Allergy status to other drugs, medicaments and biological substances status: Secondary | ICD-10-CM

## 2019-08-16 DIAGNOSIS — Z79899 Other long term (current) drug therapy: Secondary | ICD-10-CM

## 2019-08-16 DIAGNOSIS — N1831 Chronic kidney disease, stage 3a: Secondary | ICD-10-CM | POA: Diagnosis not present

## 2019-08-16 DIAGNOSIS — M5416 Radiculopathy, lumbar region: Secondary | ICD-10-CM | POA: Diagnosis present

## 2019-08-16 LAB — CBC WITH DIFFERENTIAL/PLATELET
Abs Immature Granulocytes: 0.02 10*3/uL (ref 0.00–0.07)
Basophils Absolute: 0.1 10*3/uL (ref 0.0–0.1)
Basophils Relative: 1 %
Eosinophils Absolute: 0.3 10*3/uL (ref 0.0–0.5)
Eosinophils Relative: 3 %
HCT: 45.7 % (ref 39.0–52.0)
Hemoglobin: 15.2 g/dL (ref 13.0–17.0)
Immature Granulocytes: 0 %
Lymphocytes Relative: 18 %
Lymphs Abs: 1.5 10*3/uL (ref 0.7–4.0)
MCH: 31.9 pg (ref 26.0–34.0)
MCHC: 33.3 g/dL (ref 30.0–36.0)
MCV: 95.8 fL (ref 80.0–100.0)
Monocytes Absolute: 0.7 10*3/uL (ref 0.1–1.0)
Monocytes Relative: 8 %
Neutro Abs: 5.8 10*3/uL (ref 1.7–7.7)
Neutrophils Relative %: 70 %
Platelets: 273 10*3/uL (ref 150–400)
RBC: 4.77 MIL/uL (ref 4.22–5.81)
RDW: 13.5 % (ref 11.5–15.5)
WBC: 8.3 10*3/uL (ref 4.0–10.5)
nRBC: 0 % (ref 0.0–0.2)

## 2019-08-16 LAB — COMPREHENSIVE METABOLIC PANEL
ALT: 33 U/L (ref 0–44)
AST: 35 U/L (ref 15–41)
Albumin: 4.1 g/dL (ref 3.5–5.0)
Alkaline Phosphatase: 88 U/L (ref 38–126)
Anion gap: 11 (ref 5–15)
BUN: 10 mg/dL (ref 8–23)
CO2: 25 mmol/L (ref 22–32)
Calcium: 9.5 mg/dL (ref 8.9–10.3)
Chloride: 102 mmol/L (ref 98–111)
Creatinine, Ser: 0.83 mg/dL (ref 0.61–1.24)
GFR calc Af Amer: >60 mL/min (ref >60)
GFR calc non Af Amer: >60 mL/min (ref >60)
Glucose, Bld: 113 mg/dL — ABNORMAL HIGH (ref 70–99)
Potassium: 3.7 mmol/L (ref 3.5–5.1)
Sodium: 138 mmol/L (ref 135–145)
Total Bilirubin: 0.7 mg/dL (ref 0.3–1.2)
Total Protein: 7.7 g/dL (ref 6.5–8.1)

## 2019-08-16 LAB — LACTIC ACID, PLASMA: Lactic Acid, Venous: 1.6 mmol/L (ref 0.5–1.9)

## 2019-08-16 LAB — LACTATE DEHYDROGENASE: LDH: 141 U/L (ref 98–192)

## 2019-08-16 LAB — TRIGLYCERIDES: Triglycerides: 201 mg/dL — ABNORMAL HIGH (ref <150)

## 2019-08-16 LAB — PROCALCITONIN: Procalcitonin: <0.1 ng/mL

## 2019-08-16 LAB — D-DIMER, QUANTITATIVE: D-Dimer, Quant: 0.39 ug/mL-FEU (ref 0.00–0.50)

## 2019-08-16 LAB — FIBRINOGEN: Fibrinogen: 555 mg/dL — ABNORMAL HIGH (ref 210–475)

## 2019-08-16 LAB — C-REACTIVE PROTEIN: CRP: 0.6 mg/dL (ref <1.0)

## 2019-08-16 LAB — FERRITIN: Ferritin: 67 ng/mL (ref 24–336)

## 2019-08-16 MED ORDER — DEXAMETHASONE SODIUM PHOSPHATE 10 MG/ML IJ SOLN: 6.0000 mg | INTRAMUSCULAR | Status: DC

## 2019-08-16 MED ORDER — SODIUM CHLORIDE (PF) 0.9 % IJ SOLN
INTRAMUSCULAR | Status: AC
  Filled 2019-08-16: qty 50

## 2019-08-16 MED ORDER — ONDANSETRON HCL 4 MG/2ML IJ SOLN: 4.0000 mg | Freq: Four times a day (QID) | INTRAMUSCULAR | Status: DC | PRN

## 2019-08-16 MED ORDER — ZINC SULFATE 220 (50 ZN) MG PO CAPS
220.0000 mg | ORAL_CAPSULE | Freq: Every day | ORAL | Status: DC
  Administered 2019-08-17 – 2019-08-21 (×5): 220 mg via ORAL
  Filled 2019-08-16 (×5): qty 1

## 2019-08-16 MED ORDER — ONDANSETRON HCL 4 MG/2ML IJ SOLN
4.0000 mg | Freq: Once | INTRAMUSCULAR | Status: AC
  Administered 2019-08-16: 4 mg via INTRAVENOUS
  Filled 2019-08-16: qty 2

## 2019-08-16 MED ORDER — SODIUM CHLORIDE 0.9% FLUSH: 3.0000 mL | INTRAVENOUS | Status: DC | PRN

## 2019-08-16 MED ORDER — SODIUM CHLORIDE 0.9% FLUSH
3.0000 mL | Freq: Two times a day (BID) | INTRAVENOUS | Status: DC
  Administered 2019-08-16 – 2019-08-21 (×10): 3 mL via INTRAVENOUS

## 2019-08-16 MED ORDER — BUDESONIDE-FORMOTEROL FUMARATE 160-4.5 MCG/ACT IN AERO: 2.0000 | INHALATION_SPRAY | Freq: Two times a day (BID) | RESPIRATORY_TRACT | 3 refills | Status: DC

## 2019-08-16 MED ORDER — PREDNISONE 20 MG PO TABS: 40.0000 mg | ORAL_TABLET | Freq: Every day | ORAL | 0 refills | Status: DC

## 2019-08-16 MED ORDER — CEFDINIR 300 MG PO CAPS: 600.0000 mg | ORAL_CAPSULE | Freq: Every day | ORAL | 0 refills | Status: DC

## 2019-08-16 MED ORDER — SODIUM CHLORIDE 0.9 % IV SOLN
500.0000 mg | Freq: Once | INTRAVENOUS | Status: AC
  Administered 2019-08-16: 500 mg via INTRAVENOUS
  Filled 2019-08-16: qty 500

## 2019-08-16 MED ORDER — SODIUM CHLORIDE 0.9 % IV SOLN: 100.0000 mg | Freq: Every day | INTRAVENOUS | Status: DC

## 2019-08-16 MED ORDER — METHYLPREDNISOLONE SODIUM SUCC 125 MG IJ SOLR: 125.0000 mg | Freq: Once | INTRAMUSCULAR | Status: DC

## 2019-08-16 MED ORDER — SODIUM CHLORIDE 0.9 % IV SOLN: 200.0000 mg | Freq: Once | INTRAVENOUS | Status: DC

## 2019-08-16 MED ORDER — DEXAMETHASONE SODIUM PHOSPHATE 10 MG/ML IJ SOLN
10.0000 mg | Freq: Once | INTRAMUSCULAR | Status: AC
  Administered 2019-08-16: 10 mg via INTRAVENOUS
  Filled 2019-08-16: qty 1

## 2019-08-16 MED ORDER — HYDROCOD POLST-CPM POLST ER 10-8 MG/5ML PO SUER: 5.0000 mL | Freq: Two times a day (BID) | ORAL | Status: DC | PRN

## 2019-08-16 MED ORDER — ACETAMINOPHEN 325 MG PO TABS: 650.0000 mg | ORAL_TABLET | Freq: Four times a day (QID) | ORAL | Status: DC | PRN

## 2019-08-16 MED ORDER — SODIUM CHLORIDE 0.9 % IV SOLN: 250.0000 mL | INTRAVENOUS | Status: DC | PRN

## 2019-08-16 MED ORDER — SODIUM CHLORIDE 0.9 % IV SOLN
100.0000 mg | Freq: Every day | INTRAVENOUS | Status: AC
  Administered 2019-08-18 – 2019-08-21 (×4): 100 mg via INTRAVENOUS
  Filled 2019-08-16 (×4): qty 20

## 2019-08-16 MED ORDER — ONDANSETRON HCL 4 MG PO TABS: 4.0000 mg | ORAL_TABLET | Freq: Four times a day (QID) | ORAL | Status: DC | PRN

## 2019-08-16 MED ORDER — SODIUM CHLORIDE 0.9 % IV SOLN
1000.0000 mL | INTRAVENOUS | Status: DC
  Administered 2019-08-16: 1000 mL via INTRAVENOUS

## 2019-08-16 MED ORDER — IOHEXOL 350 MG/ML SOLN
100.0000 mL | Freq: Once | INTRAVENOUS | Status: AC | PRN
  Administered 2019-08-16: 100 mL via INTRAVENOUS

## 2019-08-16 MED ORDER — SODIUM CHLORIDE 0.9 % IV SOLN
200.0000 mg | Freq: Once | INTRAVENOUS | Status: AC
  Administered 2019-08-17: 200 mg via INTRAVENOUS
  Filled 2019-08-16: qty 200

## 2019-08-16 MED ORDER — ASCORBIC ACID 500 MG PO TABS
500.0000 mg | ORAL_TABLET | Freq: Every day | ORAL | Status: DC
  Administered 2019-08-17 – 2019-08-21 (×5): 500 mg via ORAL
  Filled 2019-08-16 (×5): qty 1

## 2019-08-16 MED ORDER — SODIUM CHLORIDE 0.9 % IV SOLN
1.0000 g | Freq: Once | INTRAVENOUS | Status: AC
  Administered 2019-08-16: 1 g via INTRAVENOUS
  Filled 2019-08-16: qty 10

## 2019-08-16 MED ORDER — ENOXAPARIN SODIUM 40 MG/0.4ML ~~LOC~~ SOLN
40.0000 mg | Freq: Every day | SUBCUTANEOUS | Status: DC
  Administered 2019-08-17: 40 mg via SUBCUTANEOUS
  Filled 2019-08-16: qty 0.4

## 2019-08-16 MED ORDER — GUAIFENESIN-DM 100-10 MG/5ML PO SYRP: 10.0000 mL | ORAL_SOLUTION | ORAL | Status: DC | PRN

## 2019-08-16 NOTE — ED Notes (Signed)
Attempted to obtain two sets of blood cultures unsuccessfully. One set sent to lab.

## 2019-08-16 NOTE — ED Triage Notes (Signed)
PER EMS: Pt is coming from [facility] care with c/o SOB and generalized weakness. Patients SOB has gotten progressively worse over the past week. Pt tested positive for covid on January 8th and reports he has not recovered well from the virus. Pt has had nausea and vomiting. Pt has hx of COPD and CHF. Audible wheezes, rales, and rhonchi in lower bases. Pt is prescribed an inhaler that he says helps with SOB. Pain on inspiration with deep breathes. Ambulatory with EMS.  EMS VITALS: 88% RA 2L 94% 160/94 HR 78 RR 24 CBG 104

## 2019-08-16 NOTE — Progress Notes (Signed)
Chest xray ordered

## 2019-08-16 NOTE — Patient Instructions (Addendum)
Please return the oxygen tank our clinic tomorrow.    For x-ray imaging:  8:00 am -4:30 pm Go [facility]  [place], [place], [place] [address] Enter through Main Entrance and request x-ray department. You will be contacted either via phone or via [place] with your x-ray result.     Hypoxia Hypoxia is a condition that happens when there is a lack of oxygen in the body's tissues and organs. When there is not enough oxygen, organs cannot work as they should. This causes serious problems throughout the body and in the brain. What are the causes? This condition may be caused by:  Exposure to high altitude.  A collapsed lung (pneumothorax).  Lung infection (pneumonia).  Lung injury.  Long-term (chronic) lung disease, such as COPD (chronic obstructive pulmonary disease).  Blood collecting in the chest cavity (hemothorax).  Food, saliva, or vomit getting into the airway (aspiration).  Reduced blood flow (ischemia).  Severe blood loss.  Slow or shallow breathing (hypoventilation).  Blood disorders, such as anemia.  Carbon monoxide poisoning.  The heart suddenly stopping (cardiac arrest).  Anesthetic medicines.  Drowning.  Choking. What are the signs or symptoms? Symptoms of this condition include:  Headache.  Fatigue.  Drowsiness.  Forgetfulness.  Nausea.  Confusion.  Shortness of breath.  Dizziness.  Bluish color of the skin, lips, or nail beds (cyanosis).  Change in consciousness or awareness. If hypoxia is not treated, it can lead to convulsions, loss of consciousness (coma), or brain damage. How is this diagnosed? This condition may be diagnosed based on:  A physical exam.  Blood tests.  A test that measures how much oxygen is in your blood (pulse oximetry). This is done with a sensor that is placed on your finger, toe, or earlobe.  Chest X-ray.  Tests to check your lung function (pulmonary function tests).  A test to  check the electrical activity of your heart (electrocardiogram, ECG). You may have other tests to determine the cause of your hypoxia. How is this treated?  Treatment for this condition depends on what is causing the hypoxia. You will likely be treated with oxygen therapy. This may be done by giving you oxygen through a face mask or through tubes in your nose. Your health care provider may also recommend other therapies to treat the underlying cause of your hypoxia. Follow these instructions at home:  Take over-the-counter and prescription medicines only as told by your health care provider.  Do not use any products that contain nicotine or tobacco, such as cigarettes and e-cigarettes. If you need help quitting, ask your health care provider.  Avoid secondhand smoke.  Work with your health care provider to manage any chronic conditions you have that may be causing hypoxia, such as COPD.  Keep all follow-up visits as told by your health care provider. This is important. Contact a health care provider if:  You have a fever.  You have trouble breathing, even after treatment.  You become extremely short of breath when you exercise. Get help right away if:  Your shortness of breath gets worse, especially with normal or very little activity.  Your skin, lips, or nail beds have a bluish color.  You become confused or you cannot think properly.  You have chest pain. Summary  Hypoxia is a condition that happens when there is a lack of oxygen in the body's tissues and organs.  If hypoxia is not treated, it can lead to convulsions, loss of consciousness (coma), or  brain damage.  Symptoms of hypoxia can include a headache, shortness of breath, confusion, nausea, and a bluish skin color.  Hypoxia has many possible causes, including exposure to high altitude, carbon monoxide poisoning, or other health issues, such as blood disorders or cardiac arrest.  Hypoxia is usually treated with  oxygen therapy. This information is not intended to replace advice given to you by your health care provider. Make sure you discuss any questions you have with your health care provider. Document Revised: 06/12/2017 Document Reviewed: 08/18/2016 Elsevier Patient Education  [place] of Breath, Adult Shortness of breath means you have trouble breathing. Shortness of breath could be a sign of a medical problem. Follow these instructions at home:   Watch for any changes in your symptoms.  Do not use any products that contain nicotine or tobacco, such as cigarettes, e-cigarettes, and chewing tobacco.  Do not smoke. Smoking can cause shortness of breath. If you need help to quit smoking, ask your doctor.  Avoid things that can make it harder to breathe, such as: ? Mold. ? Dust. ? Air pollution. ? Chemical smells. ? Things that can cause allergy symptoms (allergens), if you have allergies.  Keep your living space clean. Use products that help remove mold and dust.  Rest as needed. Slowly return to your normal activities.  Take over-the-counter and prescription medicines only as told by your doctor. This includes oxygen therapy and inhaled medicines.  Keep all follow-up visits as told by your doctor. This is important. Contact a doctor if:  Your condition does not get better as soon as expected.  You have a hard time doing your normal activities, even after you rest.  You have new symptoms. Get help right away if:  Your shortness of breath gets worse.  You have trouble breathing when you are resting.  You feel light-headed or you pass out (faint).  You have a cough that is not helped by medicines.  You cough up blood.  You have pain with breathing.  You have pain in your chest, arms, shoulders, or belly (abdomen).  You have a fever.  You cannot walk up stairs.  You cannot exercise the way you normally do. These symptoms may represent a serious  problem that is an emergency. Do not wait to see if the symptoms will go away. Get medical help right away. Call your local emergency services (911 in the [place] Do not drive yourself to the hospital. Summary  Shortness of breath is when you have trouble breathing enough air. It can be a sign of a medical problem.  Avoid things that make it hard for you to breathe, such as smoking, pollution, mold, and dust.  Watch for any changes in your symptoms. Contact your doctor if you do not get better or you get worse. This information is not intended to replace advice given to you by your health care provider. Make sure you discuss any questions you have with your health care provider. Document Revised: 11/30/2017 Document Reviewed: 11/30/2017 Elsevier Patient Education  [place].

## 2019-08-16 NOTE — ED Provider Notes (Signed)
[facility] DEPT Provider Note   CSN: [id number] Arrival date & time: 08/16/19  2115     History No chief complaint on file.   [name] is a 84 y.o. male.  Pt presents to the ED today with sob.  Pt was diagnosed with Covid on 1/8.  He initially had no resp complaints.  He has been getting sob for the past few days.  The pt went to the [facility] Resp clinic and he was hypoxic on RA at 88%.  At rest, his O2 went back to 91, but then went back down with movement.  The pt has had n/v/d for the past 2 weeks.  This is getting worse in the last day.  Pt called EMS because his breathing has worsened.  Pt's O2 sat 88% for EMS as well.         Past Medical History:  Diagnosis Date  . Acute bronchitis per pt no fever--  cough since discharge from hospital 07-02-2015   per pcp note 07-05-2015  mild to moderate bronchitis vs pna  . Allergic rhinitis   . Bladder cancer ([place])   . BPH (benign prostatic hyperplasia)   . Bradycardia   . CAD (coronary artery disease)   . Chronic low back pain   . DDD (degenerative disc disease), lumbosacral   . Depression   . Diverticulosis of colon (without mention of hemorrhage)   . Dyspnea   . Essential tremor   . Fatty liver disease, nonalcoholic   . GERD (gastroesophageal reflux disease)   . Glaucoma   . History of bladder cancer    2004--  TCC low-grade , non-invasive  . History of colon polyps   . History of exercise stress test    02-05-2007-- ETT (Clinically positive, electrically equivocal, submaximal ETT,  appropriate bp response to exercise  . History of hiatal hernia   . History of kidney stones   . History of peptic ulcer   . HLD (hyperlipidemia)   . HTN (hypertension)   . Nocturia   . Productive cough   . Rheumatoid arthritis(714.0)   . Right renal artery stenosis (HCC)    per cath 03-14-2009 --- 40-50%  . RLS (restless legs syndrome)   . Vertigo, intermittent   . Weak urinary stream   . Wears dentures     upper  . Wears glasses   . Wears hearing aid    bilateral  . Wears partial dentures    lower    Patient Active Problem List   Diagnosis Date Noted  . COVID-19 virus infection 08/09/2019  . Memory loss 06/24/2019  . Wheezing 06/24/2019  . Abrasion of left arm 04/11/2019  . Joint swelling of lower leg 11/29/2018  . Abnormal urinalysis 02/25/2018  . Anxiety 01/08/2018  . Laceration of hand 12/18/2017  . Pain of left hand 12/18/2017  . Laceration of scalp without foreign body 06/09/2017  . Nonintractable headache 06/09/2017  . Acute sinus infection 04/14/2017  . Nausea and vomiting 03/27/2017  . Hyponatremia 03/17/2017  . Right lumbar radiculopathy 01/21/2017  . Fall 01/21/2017  . Synovial cyst 01/21/2017  . Chronic pain 09/11/2016  . Generalized weakness 05/22/2016  . UTI (urinary tract infection) 05/22/2016  . Weakness 05/22/2016  . Fever blister 10/18/2015  . Dyspnea 10/17/2015  . Bradycardia 09/17/2015  . Chest pain at rest 09/17/2015  . CAD in native artery 09/17/2015  . CHF (congestive heart failure) ([place]) 09/17/2015  . Pulmonary hypertension ([place]) 09/17/2015  .  Bladder cancer ([place]) 09/17/2015  . Essential tremor 09/17/2015  . Chest pain 09/17/2015  . Anginal chest pain at rest [facility]) 09/17/2015  . Hypokalemia 07/13/2015  . Vertigo 07/01/2015  . Dizzinesses 07/01/2015  . Spinal stenosis of lumbar region at multiple levels 07/06/2014  . Angular cheilitis 05/05/2014  . Left lumbar radiculopathy 05/04/2014  . Right elbow pain 12/29/2013  . Lateral epicondylitis of right elbow 12/29/2013  . Cough 09/14/2013  . Spinal stenosis, lumbar 07/27/2013  . Palpable mass of neck 07/21/2013  . Vasovagal episode 01/10/2013  . Left otitis media 10/28/2012  . Impaired glucose tolerance 10/28/2012  . Cellulitis 04/24/2012  . Hemorrhoids 04/24/2012  . Syncope 04/21/2012  . Preventative health care 11/28/2010  . Depression 09/12/2010  . RENAL ARTERY STENOSIS 09/12/2010    . FATIGUE 09/12/2010  . BUNION, RIGHT FOOT 02/05/2010  . RECTAL BLEEDING 07/10/2009  . Diarrhea 07/10/2009  . DIVERTICULOSIS OF COLON 03/28/2009  . FATTY LIVER DISEASE 03/28/2009  . BRADYCARDIA 03/20/2009  . DIZZINESS 10/17/2008  . CHEST PAIN 10/17/2008  . Malignant neoplasm of bladder ([place]) 02/07/2008  . Allergic rhinitis 02/07/2008  . COLONIC POLYPS, HX OF 02/07/2008  . Hyperlipidemia 11/24/2007  . GERD 11/24/2007  . HIATAL HERNIA WITH REFLUX 11/24/2007  . CKD (chronic kidney disease) stage 3, GFR 30-59 ml/min 11/24/2007  . Rheumatoid arthritis ([place]) 11/24/2007  . [place] DISEASE, LUMBAR 11/24/2007  . ADENOIDECTOMY, HX OF 11/24/2007    Past Surgical History:  Procedure Laterality Date  . CARDIAC CATHETERIZATION  02-12-2007  dr gregg taylor   Non-obstructive CAD,  20% pLCX,  20% LAD, ef 65%  . CARDIAC CATHETERIZATION  03-14-2009  dr Johnsie Cancel   pLAD and mid diaginal 20-30% multiple lesions,  OM1 20%, right renal ostial 40-50%,  ef 60%  . CATARACT EXTRACTION W/ INTRAOCULAR LENS  IMPLANT, BILATERAL  2016  . CYSTOSCOPY WITH BIOPSY N/A 07/11/2015   Procedure: CYSTOSCOPY WITH COLD CUP RESECTION AND FULGERATION;  Surgeon: Rana Snare, MD;  Location: [facility];  Service: Urology;  Laterality: N/A;  . HIP ARTHROSCOPY W/ LABRAL DEBRIDEMENT Left 06-29-2000   and chondraplasty  . LUMBAR [place] SURGERY  07-16-2005  . LUMBAR LAMINECTOMY/DECOMPRESSION MICRODISCECTOMY  07/09/2011   Procedure: LUMBAR LAMINECTOMY/DECOMPRESSION MICRODISCECTOMY;  Surgeon: Johnn Hai;  Location: [facility];  Service: Orthopedics;  Laterality: Left;  Decompression L5 - S1 on the Left and repair of dura  . LUMBAR LAMINECTOMY/DECOMPRESSION MICRODISCECTOMY N/A 07/27/2013   Procedure: DECOMPRESSION L4 - L5 WITH EXCISION OF SYNOVIAL CYST AND, LATERAL MASS FUSION 1 LEVEL;  Surgeon: Johnn Hai, MD;  Location: [facility];  Service: Orthopedics;  Laterality: N/A;  . LUMBAR LAMINECTOMY/DECOMPRESSION  MICRODISCECTOMY Left 07/06/2014   Procedure:  MICRO LUMBAR DECOMPRESSION L5-S1 ON LEFT, L4-5 REDO;  Surgeon: Johnn Hai, MD;  Location: [facility];  Service: Orthopedics;  Laterality: Left;  . SPINAL CORD STIMULATOR INSERTION N/A 09/11/2016   Procedure: Spinal cord stimulator placement;  Surgeon: Melina Schools, MD;  Location: [facility];  Service: Orthopedics;  Laterality: N/A;  . TONSILLECTOMY AND ADENOIDECTOMY    . TRANSTHORACIC ECHOCARDIOGRAM  09-20-2010   normal LVF, ef 55-65%/  mild MR, TR and PR/  mild LAE  . TRANSURETHRAL RESECTION OF BLADDER TUMOR  09-21-2002       Family History  Problem Relation Age of Onset  . Heart attack Mother   . Cancer Father        lung  . Cancer Sister        lung  .  Diabetes Sister   . Cancer Brother        lung that spread to brain    Social History   Tobacco Use  . Smoking status: Former Smoker    Packs/day: 2.00    Years: 20.00    Pack years: 40.00    Types: Cigarettes    Quit date: 10/08/1982    Years since quitting: 36.8  . Smokeless tobacco: Never Used  Substance Use Topics  . Alcohol use: Yes    Comment: rare wine or beer occassionally  . Drug use: No    Home Medications Prior to Admission medications   Medication Sig Start Date End Date Taking? Authorizing Provider  acetaminophen (TYLENOL) 650 MG CR tablet Take 1,300 mg by mouth at bedtime as needed for pain.   Yes [provider]  albuterol (VENTOLIN HFA) 108 (90 Base) MCG/ACT inhaler Inhale 2 puffs into the lungs every 6 (six) hours as needed for wheezing or shortness of breath. 08/09/19  Yes Biagio Borg, MD  amLODipine (NORVASC) 10 MG tablet TAKE 1 TABLET (10 MG TOTAL) DAILY. Patient taking differently: Take 10 mg by mouth daily.  05/09/19  Yes Biagio Borg, MD  brimonidine (ALPHAGAN) 0.15 % ophthalmic solution Place 1 drop into both eyes 2 (two) times daily.   Yes [provider]  cetirizine (ZYRTEC) 10 MG tablet Take 10 mg by mouth daily.   Yes [provider]  citalopram (CELEXA) 10 MG tablet TAKE 1 TABLET EVERY MORNING Patient taking differently: Take 10 mg by mouth daily.  01/31/19  Yes Biagio Borg, MD  clonazePAM (KLONOPIN) 0.5 MG tablet Take 0.5 mg by mouth daily as needed for anxiety.    Yes [provider]  clotrimazole (LOTRIMIN) 1 % cream Apply 1 application topically 2 (two) times daily as needed (FOR RASH). Prescribed for rash in groin   Yes [provider]  dextromethorphan-guaiFENesin (MUCINEX DM) 30-600 MG 12hr tablet Take 1 tablet by mouth 2 (two) times daily.   Yes [provider]  fluticasone (FLONASE) 50 MCG/ACT nasal spray USE 2 SPRAYS IN EACH NOSTRIL EVERY DAY Patient taking differently: Place 2 sprays into both nostrils daily as needed for allergies.  11/08/18  Yes Biagio Borg, MD  gabapentin (NEURONTIN) 300 MG capsule TAKE 1 TO 2 CAPSULES FOUR TIMES DAILY  AFTER MEALS AND AT BEDTIME. Patient taking differently: Take 300-600 mg by mouth at bedtime.  06/01/19  Yes Biagio Borg, MD  hydrALAZINE (APRESOLINE) 10 MG tablet TAKE 1 TABLET THREE TIMES DAILY AS NEEDED FOR BLOOD PRESSURE MORE THAN 150/90 Patient taking differently: Take 10 mg by mouth 3 (three) times daily as needed (for blood pressure more than 150/90).  01/19/19  Yes Biagio Borg, MD  hydrochlorothiazide (HYDRODIURIL) 25 MG tablet TAKE 0.5 TABLETS (12.5 MG TOTAL) BY MOUTH DAILY. Patient taking differently: Take 25 mg by mouth daily.  12/07/18  Yes Biagio Borg, MD  HYDROcodone-acetaminophen (NORCO/VICODIN) 5-325 MG tablet Take 1 tablet by mouth every 6 (six) hours as needed for severe pain. 02/15/19  Yes Plunkett, Loree Fee, MD  hydroxypropyl methylcellulose / hypromellose (ISOPTO TEARS / GONIOVISC) 2.5 % ophthalmic solution Place 1 drop into both eyes 2 (two) times daily.   Yes [provider]  lidocaine (LIDODERM) 5 % Place 1 patch onto the skin daily. Remove & Discard patch within 12 hours or as directed by MD Patient taking  differently: Place 1 patch onto the skin daily as needed (back pain). Remove &  Discard patch within 12 hours or as directed by MD 02/15/19  Yes Blanchie Dessert, MD  losartan (COZAAR) 100 MG tablet TAKE 1 TABLET EVERY DAY Patient taking differently: Take 100 mg by mouth daily.  08/05/19  Yes Biagio Borg, MD  meclizine (ANTIVERT) 25 MG tablet TAKE 1 TABLET TWICE DAILY AS NEEDED FOR DIZZINESS Patient taking differently: Take 25 mg by mouth 2 (two) times daily as needed for dizziness.  08/05/19  Yes Biagio Borg, MD  methocarbamol (ROBAXIN) 500 MG tablet Take 500 mg by mouth 3 (three) times daily as needed for muscle spasms.   Yes [provider]  nitroGLYCERIN (NITROSTAT) 0.4 MG SL tablet PLACE 1 TABLET UNDER THE TONGUE EVERY 5 MINUTES AS NEEDED FOR CHEST PAIN. MAX 3 DOSES. CALL 911 IF NO IMPROVEMENT AFTER 1ST DOSE Patient taking differently: Place 0.4 mg under the tongue every 5 (five) minutes as needed for chest pain.  02/01/18  Yes Biagio Borg, MD  Nutritional Supplements (JUICE PLUS FIBRE PO) Take 1 each 2 (two) times daily by mouth.   Yes [provider]  omeprazole (PRILOSEC) 40 MG capsule TAKE 1 CAPSULE TWICE DAILY Patient taking differently: Take 40 mg by mouth 2 (two) times daily.  08/08/19  Yes Biagio Borg, MD  ondansetron (ZOFRAN) 4 MG tablet Take 1 tablet (4 mg total) by mouth every 8 (eight) hours as needed for nausea or vomiting. 07/22/19  Yes Biagio Borg, MD  potassium chloride (KLOR-CON) 10 MEQ tablet TAKE 2 TABLETS EVERY DAY Patient taking differently: Take 20 mEq by mouth daily.  07/14/19  Yes Biagio Borg, MD  potassium chloride SA (KLOR-CON) 20 MEQ tablet Take 20 mEq by mouth daily.   Yes [provider]  primidone (MYSOLINE) 250 MG tablet TAKE 1/2 TABLET THREE TIMES DAILY Patient taking differently: Take 125 mg by mouth 3 (three) times daily.  08/05/19  Yes Biagio Borg, MD  psyllium (METAMUCIL SMOOTH TEXTURE) 58.6 % powder Take 2 tablespoons daily.  Drink 12 oz of water 02/25/18  Yes Irene Shipper, MD  simvastatin (ZOCOR) 40 MG tablet TAKE 1 AND 1/2 TABLETS AT BEDTIME Patient taking differently: Take 60 mg by mouth at bedtime.  04/29/19  Yes Biagio Borg, MD  tamsulosin (FLOMAX) 0.4 MG CAPS capsule Take 1 capsule (0.4 mg total) by mouth daily. Patient taking differently: Take 0.8 mg by mouth at bedtime.  05/17/15  Yes Biagio Borg, MD  timolol (TIMOPTIC) 0.5 % ophthalmic solution Place 1 drop into both eyes 2 (two) times daily.  05/30/15  Yes [provider]  Travoprost, BAK Free, (TRAVATAN Z) 0.004 % SOLN ophthalmic solution Place 1 drop into both eyes at bedtime. 09/14/13  Yes Biagio Borg, MD  triamcinolone (KENALOG) 0.025 % cream Apply 1 application topically 2 (two) times daily as needed (rash).    Yes [provider]  Vitamin D, Ergocalciferol, (DRISDOL) 1.25 MG (50000 UT) CAPS capsule Take 1 capsule (50,000 Units total) by mouth every 7 (seven) days. 06/27/19  Yes Biagio Borg, MD  budesonide-formoterol [facility]) 160-4.5 MCG/ACT inhaler Inhale 2 puffs into the lungs 2 (two) times daily. 08/16/19   Scot Jun, FNP  cefdinir (OMNICEF) 300 MG capsule Take 2 capsules (600 mg total) by mouth daily for 10 days. 08/16/19 08/26/19  Scot Jun, FNP  predniSONE (DELTASONE) 20 MG tablet Take 2 tablets (40 mg total) by mouth daily with breakfast for 5 days. 08/16/19 08/21/19  Scot Jun,  FNP    Allergies    Morphine and related, Hydrochlorothiazide, Tape, Sulfur dioxide, Latex, Lisinopril, and Sulfa antibiotics  Review of Systems   Review of Systems  Respiratory: Positive for shortness of breath.   All other systems reviewed and are negative.   Physical Exam Updated Vital Signs BP (!) 150/75   Pulse 69   Temp 98.9 F (37.2 C) (Oral)   Resp 16   Ht 5\' 6"  (1.676 m)   SpO2 97%   BMI 31.80 kg/m   Physical Exam Vitals and nursing note reviewed.  Constitutional:      Appearance: Normal appearance.    HENT:     Head: Normocephalic and atraumatic.     Right Ear: External ear normal.     Left Ear: External ear normal.     Nose: Nose normal.     Mouth/Throat:     Mouth: Mucous membranes are moist.     Pharynx: Oropharynx is clear.  Eyes:     Extraocular Movements: Extraocular movements intact.     Conjunctiva/sclera: Conjunctivae normal.     Pupils: Pupils are equal, round, and reactive to light.  Cardiovascular:     Rate and Rhythm: Normal rate and regular rhythm.     Pulses: Normal pulses.     Heart sounds: Normal heart sounds.  Pulmonary:     Effort: Pulmonary effort is normal. Tachypnea present.     Breath sounds: Rhonchi present.  Abdominal:     General: Abdomen is flat. Bowel sounds are normal.     Palpations: Abdomen is soft.  Musculoskeletal:        General: Normal range of motion.     Cervical back: Normal range of motion and neck supple.  Skin:    General: Skin is warm.     Capillary Refill: Capillary refill takes less than 2 seconds.  Neurological:     General: No focal deficit present.     Mental Status: He is alert and oriented to person, place, and time.  Psychiatric:        Mood and Affect: Mood normal.        Behavior: Behavior normal.        Thought Content: Thought content normal.        Judgment: Judgment normal.     ED Results / Procedures / Treatments   Labs (all labs ordered are listed, but only abnormal results are displayed) Labs Reviewed  COMPREHENSIVE METABOLIC PANEL - Abnormal; Notable for the following components:      Result Value   Glucose, Bld 113 (*)    All other components within normal limits  TRIGLYCERIDES - Abnormal; Notable for the following components:   Triglycerides 201 (*)    All other components within normal limits  FIBRINOGEN - Abnormal; Notable for the following components:   Fibrinogen 555 (*)    All other components within normal limits  CULTURE, BLOOD (ROUTINE X 2)  CULTURE, BLOOD (ROUTINE X 2)  LACTIC ACID,  PLASMA  CBC WITH DIFFERENTIAL/PLATELET  D-DIMER, QUANTITATIVE (NOT AT [facility])  PROCALCITONIN  LACTATE DEHYDROGENASE  FERRITIN  C-REACTIVE PROTEIN  LACTIC ACID, PLASMA    EKG EKG Interpretation  Date/Time:  Tuesday August 16 2019 22:24:15 EST Ventricular Rate:  70 PR Interval:    QRS Duration: 90 QT Interval:  382 QTC Calculation: 413 R Axis:   22 Text Interpretation: Sinus rhythm Minimal ST depression, lateral leads No significant change since last tracing Confirmed by Isla Pence [phone]) on 08/16/2019 10:55:35 PM  Radiology DG Chest Port 1 View  Result Date: 08/16/2019 CLINICAL DATA:  Shortness of breath and generalized weakness. EXAM: PORTABLE CHEST 1 VIEW COMPARISON:  July 22, 2019 FINDINGS: Mild to moderate severity infiltrates are seen within the right lung base and along the periphery of the mid to upper right lung. Mild atelectasis is noted within the left lung base. Small bilateral pleural effusions are seen. There is no evidence of a pneumothorax. The heart size and mediastinal contours are within normal limits. There is stable spinal stimulator wire positioning. Degenerative changes seen throughout the thoracic spine. IMPRESSION: 1. Mild to moderate severity right-sided infiltrate with mild left basilar atelectasis. This represents a new finding when compared to the prior study dated July 22, 2019. Electronically Signed   By: Virgina Norfolk M.D.   On: 08/16/2019 22:16    Procedures Procedures (including critical care time)  Medications Ordered in ED Medications  0.9 %  sodium chloride infusion (1,000 mLs Intravenous New Bag/Given 08/16/19 2235)  cefTRIAXone (ROCEPHIN) 1 g in sodium chloride 0.9 % 100 mL IVPB (has no administration in time range)  azithromycin (ZITHROMAX) 500 mg in sodium chloride 0.9 % 250 mL IVPB (500 mg Intravenous New Bag/Given 08/16/19 2322)  ondansetron (ZOFRAN) injection 4 mg (4 mg Intravenous Given 08/16/19 2237)  dexamethasone (DECADRON)  injection 10 mg (10 mg Intravenous Given 08/16/19 2323)    ED Course  I have reviewed the triage vital signs and the nursing notes.  Pertinent labs & imaging results that were available during my care of the patient were reviewed by me and considered in my medical decision making (see chart for details).  Pt is feeling better on oxygen.  He is also given decadron.  Pt has developed a pna in his lungs.  Whether this is from Covid or from CAP, I am unsure.  Pt given rocephin/zithromax.  Pt's daughter called and updated.  Pt d/w Dr. Shanon Brow (triad) for admission.  She requested that we get pharmacy involved to start Remdesivir, so that was ordered.  CRITICAL CARE Performed by: Isla Pence   Total critical care time: 30 minutes  Critical care time was exclusive of separately billable procedures and treating other patients.  Critical care was necessary to treat or prevent imminent or life-threatening deterioration.  Critical care was time spent personally by me on the following activities: development of treatment plan with patient and/or surrogate as well as nursing, discussions with consultants, evaluation of patient's response to treatment, examination of patient, obtaining history from patient or surrogate, ordering and performing treatments and interventions, ordering and review of laboratory studies, ordering and review of radiographic studies, pulse oximetry and re-evaluation of patient's condition.  [name] was evaluated in Emergency Department on 08/16/2019 for the symptoms described in the history of present illness. He was evaluated in the context of the global COVID-19 pandemic, which necessitated consideration that the patient might be at risk for infection with the SARS-CoV-2 virus that causes COVID-19. Institutional protocols and algorithms that pertain to the evaluation of patients at risk for COVID-19 are in a state of rapid change based on information released by regulatory  bodies including the CDC and federal and state organizations. These policies and algorithms were followed during the patient's care in the ED.    MDM Rules/Calculators/A&P                     Final Clinical Impression(s) / ED Diagnoses Final diagnoses:  Community acquired pneumonia of right lower  lobe of lung  Pneumonia due to COVID-19 virus  Acute respiratory failure with hypoxia [facility])    Rx / DC Orders ED Discharge Orders    None       Isla Pence, MD 08/16/19 2338

## 2019-08-16 NOTE — Progress Notes (Signed)
Patient ID: [name], male    DOB: 1936-04-30, 84 y.o.   MRN: [id number]  PCP: Biagio Borg, MD  Chief Complaint  Patient presents with  . Shortness of Breath    COVID-19      HPI [name] is a 84 y.o. male presents to [facility] Respiratory clinic for evaluation of symptoms related to worsening symptoms related to COVID-19 infection dx 07/22/19. Patient reports worsening shortness of breath, endorses persistent non productive cough and chest tightness with breathing only. The cough has been manageable with antitussive medications. He is hypoxic on room air 88%. With rest oxygen level improved to 91-92% and then dropped back to 89%. He was placed on 2 liters of oxygen and attempted to wean down to 0.5 liters any oxygen level dropped. Spoke with family and patient has had at least 2 weeks of nausea, vomiting, and diarrhea.  He has been intolerant of oral intake of food since his visit to the ER on 07/22/2019.  During that visit he was seen at the ER, chest x-ray showed chronic lung disease with no acute abnormality.  He was discharged home with recommendations for follow-up if no improvement.  For COPD patient is managed on albuterol and Symbicort (out of medication) and is not prescribed home oxygen. He endorse chills. He is dehydrated as he has had poor oral intake of food.  No known measurable fever.   High Risk for complications related to COVID-19 include: COPD, stage III kidney disease, bladder cancer, CHF with PM/ICD.  Review of Systems Pertinent negatives listed in HPI   Patient Active Problem List   Diagnosis Date Noted  . COVID-19 virus infection 08/09/2019  . Memory loss 06/24/2019  . Wheezing 06/24/2019  . Abrasion of left arm 04/11/2019  . Joint swelling of lower leg 11/29/2018  . Abnormal urinalysis 02/25/2018  . Anxiety 01/08/2018  . Laceration of hand 12/18/2017  . Pain of left hand 12/18/2017  . Laceration of scalp without foreign body 06/09/2017  . Nonintractable  headache 06/09/2017  . Acute sinus infection 04/14/2017  . Nausea and vomiting 03/27/2017  . Hyponatremia 03/17/2017  . Right lumbar radiculopathy 01/21/2017  . Fall 01/21/2017  . Synovial cyst 01/21/2017  . Chronic pain 09/11/2016  . Generalized weakness 05/22/2016  . UTI (urinary tract infection) 05/22/2016  . Weakness 05/22/2016  . Fever blister 10/18/2015  . Dyspnea 10/17/2015  . Bradycardia 09/17/2015  . Chest pain at rest 09/17/2015  . CAD in native artery 09/17/2015  . CHF (congestive heart failure) ([place]) 09/17/2015  . Pulmonary hypertension ([place]) 09/17/2015  . Bladder cancer ([place]) 09/17/2015  . Essential tremor 09/17/2015  . Chest pain 09/17/2015  . Anginal chest pain at rest [facility]) 09/17/2015  . Hypokalemia 07/13/2015  . Vertigo 07/01/2015  . Dizzinesses 07/01/2015  . Spinal stenosis of lumbar region at multiple levels 07/06/2014  . Angular cheilitis 05/05/2014  . Left lumbar radiculopathy 05/04/2014  . Right elbow pain 12/29/2013  . Lateral epicondylitis of right elbow 12/29/2013  . Cough 09/14/2013  . Spinal stenosis, lumbar 07/27/2013  . Palpable mass of neck 07/21/2013  . Vasovagal episode 01/10/2013  . Left otitis media 10/28/2012  . Impaired glucose tolerance 10/28/2012  . Cellulitis 04/24/2012  . Hemorrhoids 04/24/2012  . Syncope 04/21/2012  . Preventative health care 11/28/2010  . Depression 09/12/2010  . RENAL ARTERY STENOSIS 09/12/2010  . FATIGUE 09/12/2010  . BUNION, RIGHT FOOT 02/05/2010  . RECTAL BLEEDING 07/10/2009  . Diarrhea 07/10/2009  . DIVERTICULOSIS OF  COLON 03/28/2009  . FATTY LIVER DISEASE 03/28/2009  . BRADYCARDIA 03/20/2009  . DIZZINESS 10/17/2008  . CHEST PAIN 10/17/2008  . Malignant neoplasm of bladder ([place]) 02/07/2008  . Allergic rhinitis 02/07/2008  . COLONIC POLYPS, HX OF 02/07/2008  . Hyperlipidemia 11/24/2007  . Essential hypertension 11/24/2007  . GERD 11/24/2007  . HIATAL HERNIA WITH REFLUX 11/24/2007  . CKD (chronic  kidney disease) stage 3, GFR 30-59 ml/min 11/24/2007  . Rheumatoid arthritis ([place]) 11/24/2007  . [place] DISEASE, LUMBAR 11/24/2007  . ADENOIDECTOMY, HX OF 11/24/2007      Prior to Admission medications   Medication Sig Start Date End Date Taking? Authorizing Provider  acetaminophen (TYLENOL) 650 MG CR tablet Take 1,300 mg by mouth at bedtime as needed for pain.   Yes [provider]  albuterol (VENTOLIN HFA) 108 (90 Base) MCG/ACT inhaler Inhale 2 puffs into the lungs every 6 (six) hours as needed for wheezing or shortness of breath. 08/09/19  Yes Biagio Borg, MD  amLODipine (NORVASC) 10 MG tablet TAKE 1 TABLET (10 MG TOTAL) DAILY. 05/09/19  Yes Biagio Borg, MD  brimonidine (ALPHAGAN) 0.15 % ophthalmic solution Place 1 drop into both eyes 2 (two) times daily.   Yes [provider]  budesonide-formoterol (SYMBICORT) 160-4.5 MCG/ACT inhaler Inhale 2 puffs into the lungs 2 (two) times daily. 08/09/19  Yes Biagio Borg, MD  cetirizine (ZYRTEC) 10 MG tablet Take 10 mg by mouth daily.   Yes [provider]  citalopram (CELEXA) 10 MG tablet TAKE 1 TABLET EVERY MORNING 01/31/19  Yes Biagio Borg, MD  clonazePAM (KLONOPIN) 0.5 MG tablet Take 0.5 mg by mouth daily as needed for anxiety.    Yes [provider]  clotrimazole (LOTRIMIN) 1 % cream Apply 1 application topically 2 (two) times daily as needed (FOR RASH). Prescribed for rash in groin   Yes [provider]  dextromethorphan-guaiFENesin (MUCINEX DM) 30-600 MG 12hr tablet Take 1 tablet by mouth 2 (two) times daily.   Yes [provider]  fluticasone (FLONASE) 50 MCG/ACT nasal spray USE 2 SPRAYS IN EACH NOSTRIL EVERY DAY Patient taking differently: Place 2 sprays into both nostrils daily as needed for allergies.  11/08/18  Yes Biagio Borg, MD  gabapentin (NEURONTIN) 300 MG capsule TAKE 1 TO 2 CAPSULES FOUR TIMES DAILY  AFTER MEALS AND AT BEDTIME. Patient taking differently: Take 300-600 mg by  mouth at bedtime.  06/01/19  Yes Biagio Borg, MD  hydrALAZINE (APRESOLINE) 10 MG tablet TAKE 1 TABLET THREE TIMES DAILY AS NEEDED FOR BLOOD PRESSURE MORE THAN 150/90 01/19/19  Yes Biagio Borg, MD  hydrochlorothiazide (HYDRODIURIL) 25 MG tablet TAKE 0.5 TABLETS (12.5 MG TOTAL) BY MOUTH DAILY. Patient taking differently: Take 25 mg by mouth daily.  12/07/18  Yes Biagio Borg, MD  HYDROcodone-acetaminophen (NORCO/VICODIN) 5-325 MG tablet Take 1 tablet by mouth every 6 (six) hours as needed for severe pain. 02/15/19  Yes Plunkett, Loree Fee, MD  hydroxypropyl methylcellulose / hypromellose (ISOPTO TEARS / GONIOVISC) 2.5 % ophthalmic solution Place 1 drop into both eyes 2 (two) times daily.   Yes [provider]  lidocaine (LIDODERM) 5 % Place 1 patch onto the skin daily. Remove & Discard patch within 12 hours or as directed by MD Patient taking differently: Place 1 patch onto the skin daily as needed (back pain). Remove & Discard patch within 12 hours or as directed by MD 02/15/19  Yes Blanchie Dessert, MD  losartan (COZAAR) 100 MG tablet TAKE  1 TABLET EVERY DAY 08/05/19  Yes Biagio Borg, MD  meclizine (ANTIVERT) 25 MG tablet TAKE 1 TABLET TWICE DAILY AS NEEDED FOR DIZZINESS 08/05/19  Yes Biagio Borg, MD  methocarbamol (ROBAXIN) 500 MG tablet Take 500 mg by mouth 3 (three) times daily as needed for muscle spasms.   Yes [provider]  nitroGLYCERIN (NITROSTAT) 0.4 MG SL tablet PLACE 1 TABLET UNDER THE TONGUE EVERY 5 MINUTES AS NEEDED FOR CHEST PAIN. MAX 3 DOSES. CALL 911 IF NO IMPROVEMENT AFTER 1ST DOSE Patient taking differently: Place 0.4 mg under the tongue every 5 (five) minutes as needed for chest pain.  02/01/18  Yes Biagio Borg, MD  Nutritional Supplements (JUICE PLUS FIBRE PO) Take 1 each 2 (two) times daily by mouth.   Yes [provider]  omeprazole (PRILOSEC) 40 MG capsule TAKE 1 CAPSULE TWICE DAILY 08/08/19  Yes Biagio Borg, MD  ondansetron (ZOFRAN) 4 MG tablet  Take 1 tablet (4 mg total) by mouth every 8 (eight) hours as needed for nausea or vomiting. 07/22/19  Yes Biagio Borg, MD  potassium chloride (KLOR-CON) 10 MEQ tablet TAKE 2 TABLETS EVERY DAY 07/14/19  Yes Biagio Borg, MD  potassium chloride SA (KLOR-CON) 20 MEQ tablet Take 20 mEq by mouth daily.   Yes [provider]  primidone (MYSOLINE) 250 MG tablet TAKE 1/2 TABLET THREE TIMES DAILY 08/05/19  Yes Biagio Borg, MD  psyllium (METAMUCIL SMOOTH TEXTURE) 58.6 % powder Take 2 tablespoons daily. Drink 12 oz of water 02/25/18  Yes Irene Shipper, MD  simvastatin (ZOCOR) 40 MG tablet TAKE 1 AND 1/2 TABLETS AT BEDTIME 04/29/19  Yes Biagio Borg, MD  tamsulosin (FLOMAX) 0.4 MG CAPS capsule Take 1 capsule (0.4 mg total) by mouth daily. Patient taking differently: Take 0.8 mg by mouth at bedtime.  05/17/15  Yes Biagio Borg, MD  timolol (TIMOPTIC) 0.5 % ophthalmic solution Place 1 drop into both eyes 2 (two) times daily.  05/30/15  Yes [provider]  Travoprost, BAK Free, (TRAVATAN Z) 0.004 % SOLN ophthalmic solution Place 1 drop into both eyes at bedtime. 09/14/13  Yes Biagio Borg, MD  triamcinolone (KENALOG) 0.025 % cream Apply 1 application topically 2 (two) times daily as needed (rash).    Yes [provider]  Vitamin D, Ergocalciferol, (DRISDOL) 1.25 MG (50000 UT) CAPS capsule Take 1 capsule (50,000 Units total) by mouth every 7 (seven) days. 06/27/19  Yes Biagio Borg, MD    Past Medical, Surgical Family and Social History reviewed and updated.    Objective:   Today's Vitals   08/16/19 1851  BP: (!) 152/86  Pulse: 73  Temp: 97.9 F (36.6 C)  TempSrc: Oral  SpO2: (!) 88%  Weight: 197 lb (89.4 kg)  Height: 5\' 7"  (1.702 m)    Wt Readings from Last 3 Encounters:  08/16/19 197 lb (89.4 kg)  06/24/19 206 lb (93.4 kg)  04/11/19 198 lb (89.8 kg)   Physical Exam Constitutional:      General: He is in acute distress.     Appearance: He is ill-appearing.   HENT:     Head: Normocephalic.     Nose: Congestion present.     Mouth/Throat:     Mouth: Mucous membranes are dry.  Eyes:     Extraocular Movements: Extraocular movements intact.  Cardiovascular:     Rate and Rhythm: Normal rate and regular rhythm.  Pulmonary:     Effort: Accessory  muscle usage present.     Breath sounds: Examination of the right-upper field reveals wheezing and rales. Examination of the left-upper field reveals wheezing and rales. Examination of the right-middle field reveals decreased breath sounds. Examination of the right-lower field reveals rales. Decreased breath sounds, wheezing and rales present.  Skin:    General: Skin is dry.     Capillary Refill: Capillary refill takes 2 to 3 seconds.     Coloration: Skin is pale.  Neurological:     Mental Status: He is oriented to person, place, and time.  Psychiatric:        Mood and Affect: Mood normal.      Assessment & Plan:  1. Shortness of breath 2. COVID-19 3. Abnormal lung sounds 4. Chronic bronchitis, unspecified chronic bronchitis type ([place]) 5. Hypoxia Patient initially improved and felt stable enough to discharge home with portable oxygen, prednisone, and antibiotics.  Saturations improved from 88%-95% with 2 Liters. Patient desaturated to 89-90% with decreasing oxygen 1 liter. Increased back to 2 Liters, oxygen saturations improved 94-95%, however, patient felt feel less stable and increasingly tachypneic, and dyspneic. EMS called and patient transported to [facility]. Spoke with family via phone and made them aware that father was being transported via EMS to [place] due to acute respiratory distress.  Patient will likely require follow-up here at the respiratory clinic s/p discharge from [place], FNP-C [facility], PRN Provider  [facility]. [place], [place] [facility] Phone: [phone] Clinic Fax: [phone] Clinic Hours: 5:30 pm -7:30 pm (Monday-Friday)

## 2019-08-17 ENCOUNTER — Encounter (HOSPITAL_COMMUNITY): Payer: Self-pay | Admitting: Family Medicine

## 2019-08-17 ENCOUNTER — Ambulatory Visit: Payer: Medicare Other

## 2019-08-17 DIAGNOSIS — J1282 Pneumonia due to coronavirus disease 2019: Secondary | ICD-10-CM

## 2019-08-17 DIAGNOSIS — U071 COVID-19: Principal | ICD-10-CM

## 2019-08-17 LAB — MAGNESIUM: Magnesium: 1.8 mg/dL (ref 1.7–2.4)

## 2019-08-17 LAB — COMPREHENSIVE METABOLIC PANEL
ALT: 27 U/L (ref 0–44)
AST: 28 U/L (ref 15–41)
Albumin: 3.2 g/dL — ABNORMAL LOW (ref 3.5–5.0)
Alkaline Phosphatase: 81 U/L (ref 38–126)
Anion gap: 9 (ref 5–15)
BUN: 9 mg/dL (ref 8–23)
CO2: 23 mmol/L (ref 22–32)
Calcium: 8.9 mg/dL (ref 8.9–10.3)
Chloride: 104 mmol/L (ref 98–111)
Creatinine, Ser: 0.92 mg/dL (ref 0.61–1.24)
GFR calc Af Amer: >60 mL/min (ref >60)
GFR calc non Af Amer: >60 mL/min (ref >60)
Glucose, Bld: 228 mg/dL — ABNORMAL HIGH (ref 70–99)
Potassium: 3.9 mmol/L (ref 3.5–5.1)
Sodium: 136 mmol/L (ref 135–145)
Total Bilirubin: 0.2 mg/dL — ABNORMAL LOW (ref 0.3–1.2)
Total Protein: 6.4 g/dL — ABNORMAL LOW (ref 6.5–8.1)

## 2019-08-17 LAB — LACTIC ACID, PLASMA: Lactic Acid, Venous: 1.3 mmol/L (ref 0.5–1.9)

## 2019-08-17 LAB — FERRITIN: Ferritin: 62 ng/mL (ref 24–336)

## 2019-08-17 LAB — CBC WITH DIFFERENTIAL/PLATELET
Abs Immature Granulocytes: 0.02 10*3/uL (ref 0.00–0.07)
Basophils Absolute: 0 10*3/uL (ref 0.0–0.1)
Basophils Relative: 0 %
Eosinophils Absolute: 0 10*3/uL (ref 0.0–0.5)
Eosinophils Relative: 0 %
HCT: 40.7 % (ref 39.0–52.0)
Hemoglobin: 13.6 g/dL (ref 13.0–17.0)
Immature Granulocytes: 1 %
Lymphocytes Relative: 12 %
Lymphs Abs: 0.5 10*3/uL — ABNORMAL LOW (ref 0.7–4.0)
MCH: 31.7 pg (ref 26.0–34.0)
MCHC: 33.4 g/dL (ref 30.0–36.0)
MCV: 94.9 fL (ref 80.0–100.0)
Monocytes Absolute: 0.2 10*3/uL (ref 0.1–1.0)
Monocytes Relative: 6 %
Neutro Abs: 3.4 10*3/uL (ref 1.7–7.7)
Neutrophils Relative %: 81 %
Platelets: 273 10*3/uL (ref 150–400)
RBC: 4.29 MIL/uL (ref 4.22–5.81)
RDW: 13.4 % (ref 11.5–15.5)
WBC: 4.1 10*3/uL (ref 4.0–10.5)
nRBC: 0 % (ref 0.0–0.2)

## 2019-08-17 LAB — SAMPLE TO BLOOD BANK

## 2019-08-17 LAB — PHOSPHORUS: Phosphorus: 2.2 mg/dL — ABNORMAL LOW (ref 2.5–4.6)

## 2019-08-17 LAB — GLUCOSE, CAPILLARY
Glucose-Capillary: 250 mg/dL — ABNORMAL HIGH (ref 70–99)
Glucose-Capillary: 255 mg/dL — ABNORMAL HIGH (ref 70–99)

## 2019-08-17 LAB — D-DIMER, QUANTITATIVE: D-Dimer, Quant: 0.3 ug/mL-FEU (ref 0.00–0.50)

## 2019-08-17 LAB — C-REACTIVE PROTEIN: CRP: 0.8 mg/dL (ref <1.0)

## 2019-08-17 MED ORDER — CITALOPRAM HYDROBROMIDE 10 MG PO TABS
10.0000 mg | ORAL_TABLET | Freq: Every day | ORAL | Status: DC
  Administered 2019-08-17 – 2019-08-21 (×5): 10 mg via ORAL
  Filled 2019-08-17 (×6): qty 1

## 2019-08-17 MED ORDER — CLONAZEPAM 0.5 MG PO TABS
0.5000 mg | ORAL_TABLET | Freq: Every day | ORAL | Status: DC
  Administered 2019-08-17 – 2019-08-21 (×5): 0.5 mg via ORAL
  Filled 2019-08-17 (×5): qty 1

## 2019-08-17 MED ORDER — HYDROCODONE-ACETAMINOPHEN 5-325 MG PO TABS: 1.0000 | ORAL_TABLET | Freq: Four times a day (QID) | ORAL | Status: DC | PRN

## 2019-08-17 MED ORDER — ALBUTEROL SULFATE HFA 108 (90 BASE) MCG/ACT IN AERS
2.0000 | INHALATION_SPRAY | Freq: Four times a day (QID) | RESPIRATORY_TRACT | Status: DC | PRN
  Administered 2019-08-17: 2 via RESPIRATORY_TRACT
  Filled 2019-08-17: qty 6.7

## 2019-08-17 MED ORDER — HYDRALAZINE HCL 10 MG PO TABS
10.0000 mg | ORAL_TABLET | Freq: Three times a day (TID) | ORAL | Status: DC | PRN
  Filled 2019-08-17: qty 1

## 2019-08-17 MED ORDER — BRIMONIDINE TARTRATE 0.15 % OP SOLN
1.0000 [drp] | Freq: Two times a day (BID) | OPHTHALMIC | Status: DC
  Administered 2019-08-17 – 2019-08-21 (×9): 1 [drp] via OPHTHALMIC
  Filled 2019-08-17: qty 5

## 2019-08-17 MED ORDER — TIMOLOL MALEATE 0.5 % OP SOLN
1.0000 [drp] | Freq: Two times a day (BID) | OPHTHALMIC | Status: DC
  Administered 2019-08-17 – 2019-08-21 (×9): 1 [drp] via OPHTHALMIC
  Filled 2019-08-17: qty 5

## 2019-08-17 MED ORDER — DEXAMETHASONE SODIUM PHOSPHATE 10 MG/ML IJ SOLN
6.0000 mg | Freq: Two times a day (BID) | INTRAMUSCULAR | Status: DC
  Administered 2019-08-17 – 2019-08-21 (×9): 6 mg via INTRAVENOUS
  Filled 2019-08-17 (×9): qty 1

## 2019-08-17 MED ORDER — INSULIN ASPART 100 UNIT/ML ~~LOC~~ SOLN
0.0000 [IU] | SUBCUTANEOUS | Status: DC
  Administered 2019-08-17: 5 [IU] via SUBCUTANEOUS
  Administered 2019-08-17: 8 [IU] via SUBCUTANEOUS
  Administered 2019-08-18: 2 [IU] via SUBCUTANEOUS
  Administered 2019-08-18: 3 [IU] via SUBCUTANEOUS
  Administered 2019-08-18 (×2): 5 [IU] via SUBCUTANEOUS
  Administered 2019-08-18 (×3): 2 [IU] via SUBCUTANEOUS
  Administered 2019-08-19: 3 [IU] via SUBCUTANEOUS
  Administered 2019-08-19 (×2): 5 [IU] via SUBCUTANEOUS
  Administered 2019-08-19: 2 [IU] via SUBCUTANEOUS
  Administered 2019-08-19: 3 [IU] via SUBCUTANEOUS
  Administered 2019-08-20: 8 [IU] via SUBCUTANEOUS
  Administered 2019-08-20 – 2019-08-21 (×6): 3 [IU] via SUBCUTANEOUS

## 2019-08-17 MED ORDER — AMLODIPINE BESYLATE 10 MG PO TABS
10.0000 mg | ORAL_TABLET | Freq: Every day | ORAL | Status: DC
  Administered 2019-08-17 – 2019-08-21 (×5): 10 mg via ORAL
  Filled 2019-08-17 (×2): qty 1
  Filled 2019-08-17: qty 2
  Filled 2019-08-17 (×2): qty 1

## 2019-08-17 MED ORDER — PRIMIDONE 250 MG PO TABS
125.0000 mg | ORAL_TABLET | Freq: Three times a day (TID) | ORAL | Status: DC
  Administered 2019-08-17 – 2019-08-21 (×12): 125 mg via ORAL
  Filled 2019-08-17 (×14): qty 1

## 2019-08-17 MED ORDER — ENOXAPARIN SODIUM 40 MG/0.4ML ~~LOC~~ SOLN
40.0000 mg | SUBCUTANEOUS | Status: DC
  Administered 2019-08-18 – 2019-08-21 (×4): 40 mg via SUBCUTANEOUS
  Filled 2019-08-17 (×5): qty 0.4

## 2019-08-17 MED ORDER — LOSARTAN POTASSIUM 25 MG PO TABS
100.0000 mg | ORAL_TABLET | Freq: Every day | ORAL | Status: DC
  Administered 2019-08-17 – 2019-08-21 (×5): 100 mg via ORAL
  Filled 2019-08-17 (×4): qty 4
  Filled 2019-08-17: qty 2

## 2019-08-17 MED ORDER — GABAPENTIN 300 MG PO CAPS
300.0000 mg | ORAL_CAPSULE | Freq: Every day | ORAL | Status: DC
  Administered 2019-08-17 – 2019-08-20 (×4): 300 mg via ORAL
  Filled 2019-08-17 (×4): qty 1

## 2019-08-17 NOTE — Progress Notes (Signed)
Report called to [facility] for patient to go to room 306. Also notified DIL and son Glendell Docker and [place] of transfer

## 2019-08-17 NOTE — Progress Notes (Signed)
PROGRESS NOTE    [name]  [id number] DOB: 10/04/1935 DOA: 08/16/2019 PCP: Biagio Borg, MD   Brief Narrative:   84 y.o. WM PMHx , Anxiety, Depression,Essential Tremor, vertigo intermittent Hx Bladder cancer, Rheumatoid Arthritis, Bradycardia, CAD, essential HTN, HLD, chronic low back pain, Hx nephrolithiasis, CKD stage III, Hx peptic ulcer,  Comes in with worsening shortness of breath and body aches and chest pain that occurred over the last several days.  Patient has diagnosed with Covid on July 22, 2019 and has been doing well up until last several days.  He was found to be hypoxic in the follow-up clinic with O2 sats of 88%.  He denies any nausea vomiting or diarrhea.  Denies any abdominal pain.  Denies any lower extremity swelling or edema.  Patient found to have bilateral opacities referred for admission for acute hypoxic respiratory failure secondary to bilateral Covid pneumonia.   Subjective: A/O x4, negative SOB.  Patient seen earlier today.   Assessment & Plan:   Principal Problem:   Pneumonia due to COVID-19 virus Active Problems:   Malignant neoplasm of bladder (HCC)   CKD (chronic kidney disease) stage 3, GFR 30-59 ml/min   Rheumatoid arthritis (HCC)   Depression   Syncope   Spinal stenosis, lumbar   Left lumbar radiculopathy   Vertigo   Bradycardia   CAD in native artery   Essential tremor   Anxiety   Memory loss   Acute respiratory failure due to COVID-19 ([place])   Covid pneumonia/acute respiratory failure with hypoxia COVID-19 Labs  Recent Labs    08/16/19 2200  DDIMER 0.39  FERRITIN 67  LDH 141  CRP 0.6    Lab Results  Component Value Date   SARSCOV2NAA POSITIVE (A) 07/22/2019   SARSCOV2NAA Detected (A) 07/21/2019   [place] Not Detected 06/03/2019   Bradycardia  Syncope  CAD in native artery   CKD stage III  Hx bladder cancer  Memory loss  Anxiety/depression  Hyperglycemia -Hemoglobin A1c pending -Moderate  SSI    DVT prophylaxis: Lovenox Code Status:  Family Communication:  Disposition Plan:    Consultants:    Procedures/Significant Events:     I have personally reviewed and interpreted all radiology studies and my findings are as above.  VENTILATOR SETTINGS: Room air 2/3 SPO2 92%   Cultures   Antimicrobials: Anti-infectives (From admission, onward)   Start     Dose/Rate Stop   08/18/19 1000  remdesivir 100 mg in sodium chloride 0.9 % 100 mL IVPB     100 mg 200 mL/hr over 30 Minutes 08/22/19 0959   08/17/19 1000  remdesivir 100 mg in sodium chloride 0.9 % 100 mL IVPB  Status:  Discontinued     100 mg 200 mL/hr over 30 Minutes 08/16/19 2356   08/17/19 0000  remdesivir 200 mg in sodium chloride 0.9% 250 mL IVPB  Status:  Discontinued     200 mg 580 mL/hr over 30 Minutes 08/16/19 2356   08/16/19 2359  remdesivir 200 mg in sodium chloride 0.9% 250 mL IVPB     200 mg 580 mL/hr over 30 Minutes 08/17/19 0108   08/16/19 2300  cefTRIAXone (ROCEPHIN) 1 g in sodium chloride 0.9 % 100 mL IVPB     1 g 200 mL/hr over 30 Minutes 08/17/19 0012   08/16/19 2300  azithromycin (ZITHROMAX) 500 mg in sodium chloride 0.9 % 250 mL IVPB     500 mg 250 mL/hr over 60 Minutes 08/17/19 0108  Devices    LINES / TUBES:      Continuous Infusions: . sodium chloride 1,000 mL (08/16/19 2235)  . sodium chloride    . [START ON 08/18/2019] remdesivir 100 mg in NS 100 mL       Objective: Vitals:   08/17/19 0145 08/17/19 0200 08/17/19 0255 08/17/19 0732  BP: 138/85 132/81 132/81 116/72  Pulse: 70 77 77 67  Resp: (!) 24 (!) 22 (!) 22 16  Temp:  98.6 F (37 C) 98.6 F (37 C) 97.7 F (36.5 C)  TempSrc:  Oral Oral Oral  SpO2: 98% 98%  96%  Weight:   89 kg   Height:   5\' 6"  (1.676 m)     Intake/Output Summary (Last 24 hours) at 08/17/2019 [id number] Last data filed at 08/17/2019 0200 Gross per 24 hour  Intake 600 ml  Output 350 ml  Net 250 ml   Filed Weights   08/17/19 0255   Weight: 89 kg    Examination:  General: A/O x4, no acute respiratory distress Eyes: negative scleral hemorrhage, negative anisocoria, negative icterus ENT: Negative Runny nose, negative gingival bleeding, Neck:  Negative scars, masses, torticollis, lymphadenopathy, JVD Lungs: Clear to auscultation bilaterally without wheezes or crackles Cardiovascular: Regular rate and rhythm without murmur gallop or rub normal S1 and S2 Abdomen: negative abdominal pain, nondistended, positive soft, bowel sounds, no rebound, no ascites, no appreciable mass Extremities: No significant cyanosis, clubbing, or edema bilateral lower extremities Skin: Negative rashes, lesions, ulcers Psychiatric:  Negative depression, negative anxiety, negative fatigue, negative mania  Central nervous system:  Cranial nerves II through XII intact, tongue/uvula midline, all extremities muscle strength 5/5, sensation intact throughout,  negative dysarthria, negative expressive aphasia, negative receptive aphasia.  .     Data Reviewed: Care during the described time interval was provided by me .  I have reviewed this patient's available data, including medical history, events of note, physical examination, and all test results as part of my evaluation.   CBC: Recent Labs  Lab 08/16/19 2200  WBC 8.3  NEUTROABS 5.8  HGB 15.2  HCT 45.7  MCV 95.8  PLT [id number]   Basic Metabolic Panel: Recent Labs  Lab 08/16/19 2200  NA 138  K 3.7  CL 102  CO2 25  GLUCOSE 113*  BUN 10  CREATININE 0.83  CALCIUM 9.5   GFR: Estimated Creatinine Clearance: 71.7 mL/min (by C-G formula based on SCr of 0.83 mg/dL). Liver Function Tests: Recent Labs  Lab 08/16/19 2200  AST 35  ALT 33  ALKPHOS 88  BILITOT 0.7  PROT 7.7  ALBUMIN 4.1   No results for input(s): LIPASE, AMYLASE in the last 168 hours. No results for input(s): AMMONIA in the last 168 hours. Coagulation Profile: No results for input(s): INR, PROTIME in the last 168  hours. Cardiac Enzymes: No results for input(s): CKTOTAL, CKMB, CKMBINDEX, TROPONINI in the last 168 hours. BNP (last 3 results) No results for input(s): PROBNP in the last 8760 hours. HbA1C: No results for input(s): HGBA1C in the last 72 hours. CBG: No results for input(s): GLUCAP in the last 168 hours. Lipid Profile: Recent Labs    08/16/19 2200  TRIG 201*   Thyroid Function Tests: No results for input(s): TSH, T4TOTAL, FREET4, T3FREE, THYROIDAB in the last 72 hours. Anemia Panel: Recent Labs    08/16/19 2200  FERRITIN 67   Urine analysis:    Component Value Date/Time   COLORURINE AMBER (A) 07/22/2019 [place] 07/22/2019 1954  LABSPEC 1.030 07/22/2019 1954   PHURINE 5.0 07/22/2019 1954   GLUCOSEU NEGATIVE 07/22/2019 1954   GLUCOSEU NEGATIVE 06/24/2019 1204   HGBUR NEGATIVE 07/22/2019 [place] NEGATIVE 07/22/2019 1954   KETONESUR 5 (A) 07/22/2019 1954   PROTEINUR 30 (A) 07/22/2019 1954   UROBILINOGEN 0.2 06/24/2019 1204   NITRITE NEGATIVE 07/22/2019 [place] 07/22/2019 1954   Sepsis Labs: @LABRCNTIP (procalcitonin:4,lacticidven:4)  )No results found for this or any previous visit (from the past 240 hour(s)).       Radiology Studies: CT Angio Chest PE W and/or Wo Contrast  Result Date: 08/17/2019 CLINICAL DATA:  PE suspected. Increasing shortness of breath. COVID-19 positive. EXAM: CT ANGIOGRAPHY CHEST WITH CONTRAST TECHNIQUE: Multidetector CT imaging of the chest was performed using the standard protocol during bolus administration of intravenous contrast. Multiplanar CT image reconstructions and MIPs were obtained to evaluate the vascular anatomy. CONTRAST:  131mL OMNIPAQUE IOHEXOL 350 MG/ML SOLN COMPARISON:  CT chest dated 02/24/2019 FINDINGS: Cardiovascular: Evaluation is limited by respiratory motion artifact.There is no pulmonary embolus. The main pulmonary artery is within normal limits for size. There is no CT  evidence of acute right heart strain. There are atherosclerotic changes of the thoracic aorta without evidence for a thoracic aortic aneurysm. Heart size is the heart size is borderline enlarged. Coronary artery calcifications are noted. Mediastinum/Nodes: --No mediastinal or hilar lymphadenopathy. --No axillary lymphadenopathy. --No supraclavicular lymphadenopathy. --Normal thyroid gland. --The esophagus is unremarkable Lungs/Pleura: There are scattered diffuse bilateral ground-glass airspace opacities. Emphysematous changes are noted. There are trace bilateral pleural effusions, right greater than left. There is no pneumothorax. Upper Abdomen: No acute abnormality. Musculoskeletal: No chest wall abnormality. No acute or significant osseous findings. Review of the MIP images confirms the above findings. IMPRESSION: 1. Evaluation is limited by respiratory motion artifact. 2. No evidence for acute pulmonary embolus. 3. Diffuse bilateral ground-glass airspace opacities, which can be seen in patients with COVID-19 pneumonia. 4. Trace bilateral pleural effusions. 5. Borderline cardiomegaly. 6. Coronary artery calcifications. Aortic Atherosclerosis (ICD10-I70.0) and Emphysema (ICD10-J43.9). Electronically Signed   By: Constance Holster M.D.   On: 08/17/2019 00:16   DG Chest Port 1 View  Result Date: 08/16/2019 CLINICAL DATA:  Shortness of breath and generalized weakness. EXAM: PORTABLE CHEST 1 VIEW COMPARISON:  July 22, 2019 FINDINGS: Mild to moderate severity infiltrates are seen within the right lung base and along the periphery of the mid to upper right lung. Mild atelectasis is noted within the left lung base. Small bilateral pleural effusions are seen. There is no evidence of a pneumothorax. The heart size and mediastinal contours are within normal limits. There is stable spinal stimulator wire positioning. Degenerative changes seen throughout the thoracic spine. IMPRESSION: 1. Mild to moderate severity  right-sided infiltrate with mild left basilar atelectasis. This represents a new finding when compared to the prior study dated July 22, 2019. Electronically Signed   By: Virgina Norfolk M.D.   On: 08/16/2019 22:16        Scheduled Meds: . amLODipine  10 mg Oral Daily  . vitamin C  500 mg Oral Daily  . brimonidine  1 drop Both Eyes BID  . citalopram  10 mg Oral Daily  . clonazePAM  0.5 mg Oral Daily  . dexamethasone (DECADRON) injection  6 mg Intravenous Q24H  . enoxaparin (LOVENOX) injection  40 mg Subcutaneous Daily  . gabapentin  300 mg Oral QHS  . losartan  100 mg Oral Daily  . sodium chloride flush  3 mL Intravenous Q12H  . timolol  1 drop Both Eyes BID  . zinc sulfate  220 mg Oral Daily   Continuous Infusions: . sodium chloride 1,000 mL (08/16/19 2235)  . sodium chloride    . [START ON 08/18/2019] remdesivir 100 mg in NS 100 mL       LOS: 1 day   The patient is critically ill with multiple organ systems failure and requires high complexity decision making for assessment and support, frequent evaluation and titration of therapies, application of advanced monitoring technologies and extensive interpretation of multiple databases. Critical Care Time devoted to patient care services described in this note  Time spent: 40 minutes     Jonda Alanis, Geraldo Docker, MD [facility] Pager [phone]  If 7PM-7AM, please contact night-coverage [email or web address] Password [facility] 08/17/2019, 7:37 AM

## 2019-08-17 NOTE — Progress Notes (Signed)
Spoke with DIL Crystal who requested that the MD call to speak to her or her husband regarding plan with patient. Secure chat sent to MD to call when possible to speak to family per their request.

## 2019-08-17 NOTE — ED Notes (Signed)
[facility] called for transportation. Paperwork at nurses station. 

## 2019-08-17 NOTE — H&P (Signed)
History and Physical    Casey KLEINKE [id number] DOB: June 25, 1936 DOA: 08/16/2019  PCP: Biagio Borg, MD  Patient coming from: home  Chief Complaint:  Sob and cp  HPI: [name] is a 84 y.o. male with medical history significant of coronary artery disease, history of bladder cancer hypertension, rheumatoid arthritis comes in with worsening shortness of breath and body aches and chest pain that occurred over the last several days.  Patient has diagnosed with Covid on July 22, 2019 and has been doing well up until last several days.  He was found to be hypoxic in the follow-up clinic with O2 sats of 88%.  He denies any nausea vomiting or diarrhea.  Denies any abdominal pain.  Denies any lower extremity swelling or edema.  Patient found to have bilateral opacities referred for admission for acute hypoxic respiratory failure secondary to bilateral Covid pneumonia.  Review of Systems: As per HPI otherwise 10 point review of systems negative.   Past Medical History:  Diagnosis Date  . Acute bronchitis per pt no fever--  cough since discharge from hospital 07-02-2015   per pcp note 07-05-2015  mild to moderate bronchitis vs pna  . Allergic rhinitis   . Bladder cancer ([place])   . BPH (benign prostatic hyperplasia)   . Bradycardia   . CAD (coronary artery disease)   . Chronic low back pain   . DDD (degenerative disc disease), lumbosacral   . Depression   . Diverticulosis of colon (without mention of hemorrhage)   . Dyspnea   . Essential tremor   . Fatty liver disease, nonalcoholic   . GERD (gastroesophageal reflux disease)   . Glaucoma   . History of bladder cancer    2004--  TCC low-grade , non-invasive  . History of colon polyps   . History of exercise stress test    02-05-2007-- ETT (Clinically positive, electrically equivocal, submaximal ETT,  appropriate bp response to exercise  . History of hiatal hernia   . History of kidney stones   . History of peptic ulcer   . HLD  (hyperlipidemia)   . HTN (hypertension)   . Nocturia   . Productive cough   . Rheumatoid arthritis(714.0)   . Right renal artery stenosis (HCC)    per cath 03-14-2009 --- 40-50%  . RLS (restless legs syndrome)   . Vertigo, intermittent   . Weak urinary stream   . Wears dentures    upper  . Wears glasses   . Wears hearing aid    bilateral  . Wears partial dentures    lower    Past Surgical History:  Procedure Laterality Date  . CARDIAC CATHETERIZATION  02-12-2007  dr gregg taylor   Non-obstructive CAD,  20% pLCX,  20% LAD, ef 65%  . CARDIAC CATHETERIZATION  03-14-2009  dr Johnsie Cancel   pLAD and mid diaginal 20-30% multiple lesions,  OM1 20%, right renal ostial 40-50%,  ef 60%  . CATARACT EXTRACTION W/ INTRAOCULAR LENS  IMPLANT, BILATERAL  2016  . CYSTOSCOPY WITH BIOPSY N/A 07/11/2015   Procedure: CYSTOSCOPY WITH COLD CUP RESECTION AND FULGERATION;  Surgeon: Rana Snare, MD;  Location: [facility];  Service: Urology;  Laterality: N/A;  . HIP ARTHROSCOPY W/ LABRAL DEBRIDEMENT Left 06-29-2000   and chondraplasty  . LUMBAR [place] SURGERY  07-16-2005  . LUMBAR LAMINECTOMY/DECOMPRESSION MICRODISCECTOMY  07/09/2011   Procedure: LUMBAR LAMINECTOMY/DECOMPRESSION MICRODISCECTOMY;  Surgeon: Johnn Hai;  Location: [facility];  Service: Orthopedics;  Laterality: Left;  Decompression L5 - S1 on the Left and repair of dura  . LUMBAR LAMINECTOMY/DECOMPRESSION MICRODISCECTOMY N/A 07/27/2013   Procedure: DECOMPRESSION L4 - L5 WITH EXCISION OF SYNOVIAL CYST AND, LATERAL MASS FUSION 1 LEVEL;  Surgeon: Johnn Hai, MD;  Location: [facility];  Service: Orthopedics;  Laterality: N/A;  . LUMBAR LAMINECTOMY/DECOMPRESSION MICRODISCECTOMY Left 07/06/2014   Procedure:  MICRO LUMBAR DECOMPRESSION L5-S1 ON LEFT, L4-5 REDO;  Surgeon: Johnn Hai, MD;  Location: [facility];  Service: Orthopedics;  Laterality: Left;  . SPINAL CORD STIMULATOR INSERTION N/A 09/11/2016   Procedure: Spinal cord stimulator  placement;  Surgeon: Melina Schools, MD;  Location: [place];  Service: Orthopedics;  Laterality: N/A;  . TONSILLECTOMY AND ADENOIDECTOMY    . TRANSTHORACIC ECHOCARDIOGRAM  09-20-2010   normal LVF, ef 55-65%/  mild MR, TR and PR/  mild LAE  . TRANSURETHRAL RESECTION OF BLADDER TUMOR  09-21-2002     reports that he quit smoking about 36 years ago. His smoking use included cigarettes. He has a 40.00 pack-year smoking history. He has never used smokeless tobacco. He reports current alcohol use. He reports that he does not use drugs.  Allergies  Allergen Reactions  . Morphine And Related Other (See Comments)    Migraine   . Hydrochlorothiazide Other (See Comments)    REACTION: hyponatremia with daily use  . Tape Itching    Patient  Is ok to use paper tape  . Sulfur Dioxide   . Latex Rash  . Lisinopril Cough  . Sulfa Antibiotics Rash    Family History  Problem Relation Age of Onset  . Heart attack Mother   . Cancer Father        lung  . Cancer Sister        lung  . Diabetes Sister   . Cancer Brother        lung that spread to brain    Prior to Admission medications   Medication Sig Start Date End Date Taking? Authorizing Provider  acetaminophen (TYLENOL) 650 MG CR tablet Take 1,300 mg by mouth at bedtime as needed for pain.   Yes [provider]  albuterol (VENTOLIN HFA) 108 (90 Base) MCG/ACT inhaler Inhale 2 puffs into the lungs every 6 (six) hours as needed for wheezing or shortness of breath. 08/09/19  Yes Biagio Borg, MD  amLODipine (NORVASC) 10 MG tablet TAKE 1 TABLET (10 MG TOTAL) DAILY. Patient taking differently: Take 10 mg by mouth daily.  05/09/19  Yes Biagio Borg, MD  brimonidine (ALPHAGAN) 0.15 % ophthalmic solution Place 1 drop into both eyes 2 (two) times daily.   Yes [provider]  cetirizine (ZYRTEC) 10 MG tablet Take 10 mg by mouth daily.   Yes [provider]  citalopram (CELEXA) 10 MG tablet TAKE 1 TABLET EVERY MORNING Patient  taking differently: Take 10 mg by mouth daily.  01/31/19  Yes Biagio Borg, MD  clonazePAM (KLONOPIN) 0.5 MG tablet Take 0.5 mg by mouth daily as needed for anxiety.    Yes [provider]  clotrimazole (LOTRIMIN) 1 % cream Apply 1 application topically 2 (two) times daily as needed (FOR RASH). Prescribed for rash in groin   Yes [provider]  dextromethorphan-guaiFENesin (MUCINEX DM) 30-600 MG 12hr tablet Take 1 tablet by mouth 2 (two) times daily.   Yes [provider]  fluticasone (FLONASE) 50 MCG/ACT nasal spray USE 2 SPRAYS IN EACH NOSTRIL EVERY DAY Patient taking differently: Place 2 sprays into both  nostrils daily as needed for allergies.  11/08/18  Yes Biagio Borg, MD  gabapentin (NEURONTIN) 300 MG capsule TAKE 1 TO 2 CAPSULES FOUR TIMES DAILY  AFTER MEALS AND AT BEDTIME. Patient taking differently: Take 300-600 mg by mouth at bedtime.  06/01/19  Yes Biagio Borg, MD  hydrALAZINE (APRESOLINE) 10 MG tablet TAKE 1 TABLET THREE TIMES DAILY AS NEEDED FOR BLOOD PRESSURE MORE THAN 150/90 Patient taking differently: Take 10 mg by mouth 3 (three) times daily as needed (for blood pressure more than 150/90).  01/19/19  Yes Biagio Borg, MD  hydrochlorothiazide (HYDRODIURIL) 25 MG tablet TAKE 0.5 TABLETS (12.5 MG TOTAL) BY MOUTH DAILY. Patient taking differently: Take 25 mg by mouth daily.  12/07/18  Yes Biagio Borg, MD  HYDROcodone-acetaminophen (NORCO/VICODIN) 5-325 MG tablet Take 1 tablet by mouth every 6 (six) hours as needed for severe pain. 02/15/19  Yes Plunkett, Loree Fee, MD  hydroxypropyl methylcellulose / hypromellose (ISOPTO TEARS / GONIOVISC) 2.5 % ophthalmic solution Place 1 drop into both eyes 2 (two) times daily.   Yes [provider]  lidocaine (LIDODERM) 5 % Place 1 patch onto the skin daily. Remove & Discard patch within 12 hours or as directed by MD Patient taking differently: Place 1 patch onto the skin daily as needed (back pain). Remove & Discard  patch within 12 hours or as directed by MD 02/15/19  Yes Blanchie Dessert, MD  losartan (COZAAR) 100 MG tablet TAKE 1 TABLET EVERY DAY Patient taking differently: Take 100 mg by mouth daily.  08/05/19  Yes Biagio Borg, MD  meclizine (ANTIVERT) 25 MG tablet TAKE 1 TABLET TWICE DAILY AS NEEDED FOR DIZZINESS Patient taking differently: Take 25 mg by mouth 2 (two) times daily as needed for dizziness.  08/05/19  Yes Biagio Borg, MD  methocarbamol (ROBAXIN) 500 MG tablet Take 500 mg by mouth 3 (three) times daily as needed for muscle spasms.   Yes [provider]  nitroGLYCERIN (NITROSTAT) 0.4 MG SL tablet PLACE 1 TABLET UNDER THE TONGUE EVERY 5 MINUTES AS NEEDED FOR CHEST PAIN. MAX 3 DOSES. CALL 911 IF NO IMPROVEMENT AFTER 1ST DOSE Patient taking differently: Place 0.4 mg under the tongue every 5 (five) minutes as needed for chest pain.  02/01/18  Yes Biagio Borg, MD  Nutritional Supplements (JUICE PLUS FIBRE PO) Take 1 each 2 (two) times daily by mouth.   Yes [provider]  omeprazole (PRILOSEC) 40 MG capsule TAKE 1 CAPSULE TWICE DAILY Patient taking differently: Take 40 mg by mouth 2 (two) times daily.  08/08/19  Yes Biagio Borg, MD  ondansetron (ZOFRAN) 4 MG tablet Take 1 tablet (4 mg total) by mouth every 8 (eight) hours as needed for nausea or vomiting. 07/22/19  Yes Biagio Borg, MD  potassium chloride (KLOR-CON) 10 MEQ tablet TAKE 2 TABLETS EVERY DAY Patient taking differently: Take 20 mEq by mouth daily.  07/14/19  Yes Biagio Borg, MD  potassium chloride SA (KLOR-CON) 20 MEQ tablet Take 20 mEq by mouth daily.   Yes [provider]  primidone (MYSOLINE) 250 MG tablet TAKE 1/2 TABLET THREE TIMES DAILY Patient taking differently: Take 125 mg by mouth 3 (three) times daily.  08/05/19  Yes Biagio Borg, MD  psyllium (METAMUCIL SMOOTH TEXTURE) 58.6 % powder Take 2 tablespoons daily. Drink 12 oz of water 02/25/18  Yes Irene Shipper, MD  simvastatin (ZOCOR) 40 MG tablet  TAKE 1 AND 1/2 TABLETS AT BEDTIME Patient taking  differently: Take 60 mg by mouth at bedtime.  04/29/19  Yes Biagio Borg, MD  tamsulosin (FLOMAX) 0.4 MG CAPS capsule Take 1 capsule (0.4 mg total) by mouth daily. Patient taking differently: Take 0.8 mg by mouth at bedtime.  05/17/15  Yes Biagio Borg, MD  timolol (TIMOPTIC) 0.5 % ophthalmic solution Place 1 drop into both eyes 2 (two) times daily.  05/30/15  Yes [provider]  Travoprost, BAK Free, (TRAVATAN Z) 0.004 % SOLN ophthalmic solution Place 1 drop into both eyes at bedtime. 09/14/13  Yes Biagio Borg, MD  triamcinolone (KENALOG) 0.025 % cream Apply 1 application topically 2 (two) times daily as needed (rash).    Yes [provider]  Vitamin D, Ergocalciferol, (DRISDOL) 1.25 MG (50000 UT) CAPS capsule Take 1 capsule (50,000 Units total) by mouth every 7 (seven) days. 06/27/19  Yes Biagio Borg, MD  budesonide-formoterol [facility]) 160-4.5 MCG/ACT inhaler Inhale 2 puffs into the lungs 2 (two) times daily. 08/16/19   Scot Jun, FNP  cefdinir (OMNICEF) 300 MG capsule Take 2 capsules (600 mg total) by mouth daily for 10 days. 08/16/19 08/26/19  Scot Jun, FNP  predniSONE (DELTASONE) 20 MG tablet Take 2 tablets (40 mg total) by mouth daily with breakfast for 5 days. 08/16/19 08/21/19  Scot Jun, FNP    Physical Exam: Vitals:   08/16/19 2130 08/16/19 2230  BP: (!) 149/74 (!) 150/75  Pulse: 79 69  Resp: 17 16  Temp: 98.9 F (37.2 C)   TempSrc: Oral   SpO2: 96% 97%  Height: 5\' 6"  (1.676 m)       Constitutional: NAD, calm, comfortable Vitals:   08/16/19 2130 08/16/19 2230  BP: (!) 149/74 (!) 150/75  Pulse: 79 69  Resp: 17 16  Temp: 98.9 F (37.2 C)   TempSrc: Oral   SpO2: 96% 97%  Height: 5\' 6"  (1.676 m)    Eyes: PERRL, lids and conjunctivae normal ENMT: Mucous membranes are moist. Posterior pharynx clear of any exudate or lesions.Normal dentition.  Neck: normal, supple, no masses, no  thyromegaly Respiratory: clear to auscultation bilaterally, no wheezing, no crackles. Normal respiratory effort. No accessory muscle use.  Cardiovascular: Regular rate and rhythm, no murmurs / rubs / gallops. No extremity edema. 2+ pedal pulses. No carotid bruits.  Abdomen: no tenderness, no masses palpated. No hepatosplenomegaly. Bowel sounds positive.  Musculoskeletal: no clubbing / cyanosis. No joint deformity upper and lower extremities. Good ROM, no contractures. Normal muscle tone.  Skin: no rashes, lesions, ulcers. No induration Neurologic: CN 2-12 grossly intact. Sensation intact, DTR normal. Strength 5/5 in all 4.  Psychiatric: Normal judgment and insight. Alert and oriented x 3. Normal mood.    Labs on Admission: I have personally reviewed following labs and imaging studies  CBC: Recent Labs  Lab 08/16/19 2200  WBC 8.3  NEUTROABS 5.8  HGB 15.2  HCT 45.7  MCV 95.8  PLT 123456   Basic Metabolic Panel: Recent Labs  Lab 08/16/19 2200  NA 138  K 3.7  CL 102  CO2 25  GLUCOSE 113*  BUN 10  CREATININE 0.83  CALCIUM 9.5   GFR: Estimated Creatinine Clearance: 71.8 mL/min (by C-G formula based on SCr of 0.83 mg/dL). Liver Function Tests: Recent Labs  Lab 08/16/19 2200  AST 35  ALT 33  ALKPHOS 88  BILITOT 0.7  PROT 7.7  ALBUMIN 4.1   No results for input(s): LIPASE, AMYLASE in the last 168 hours. No results for  input(s): AMMONIA in the last 168 hours. Coagulation Profile: No results for input(s): INR, PROTIME in the last 168 hours. Cardiac Enzymes: No results for input(s): CKTOTAL, CKMB, CKMBINDEX, TROPONINI in the last 168 hours. BNP (last 3 results) No results for input(s): PROBNP in the last 8760 hours. HbA1C: No results for input(s): HGBA1C in the last 72 hours. CBG: No results for input(s): GLUCAP in the last 168 hours. Lipid Profile: Recent Labs    08/16/19 2200  TRIG 201*   Thyroid Function Tests: No results for input(s): TSH, T4TOTAL, FREET4,  T3FREE, THYROIDAB in the last 72 hours. Anemia Panel: Recent Labs    08/16/19 2200  FERRITIN 67   Urine analysis:    Component Value Date/Time   COLORURINE AMBER (A) 07/22/2019 1954   APPEARANCEUR CLEAR 07/22/2019 1954   LABSPEC 1.030 07/22/2019 1954   PHURINE 5.0 07/22/2019 1954   GLUCOSEU NEGATIVE 07/22/2019 1954   GLUCOSEU NEGATIVE 06/24/2019 [place] 07/22/2019 [place] NEGATIVE 07/22/2019 1954   KETONESUR 5 (A) 07/22/2019 1954   PROTEINUR 30 (A) 07/22/2019 1954   UROBILINOGEN 0.2 06/24/2019 1204   NITRITE NEGATIVE 07/22/2019 1954   LEUKOCYTESUR NEGATIVE 07/22/2019 1954   Sepsis Labs: !!!!!!!!!!!!!!!!!!!!!!!!!!!!!!!!!!!!!!!!!!!! @LABRCNTIP (procalcitonin:4,lacticidven:4) )No results found for this or any previous visit (from the past 240 hour(s)).   Radiological Exams on Admission: DG Chest Port 1 View  Result Date: 08/16/2019 CLINICAL DATA:  Shortness of breath and generalized weakness. EXAM: PORTABLE CHEST 1 VIEW COMPARISON:  July 22, 2019 FINDINGS: Mild to moderate severity infiltrates are seen within the right lung base and along the periphery of the mid to upper right lung. Mild atelectasis is noted within the left lung base. Small bilateral pleural effusions are seen. There is no evidence of a pneumothorax. The heart size and mediastinal contours are within normal limits. There is stable spinal stimulator wire positioning. Degenerative changes seen throughout the thoracic spine. IMPRESSION: 1. Mild to moderate severity right-sided infiltrate with mild left basilar atelectasis. This represents a new finding when compared to the prior study dated July 22, 2019. Electronically Signed   By: Virgina Norfolk M.D.   On: 08/16/2019 22:16   Old chart reviewed Case discussed with edp cxr reviewed bilateral opacities ekg reviewed nsr no acute issues  Assessment/Plan 84 year old male with acute hypoxic respiratory failure secondary to Covid pneumonia  bilaterally Principal Problem:   Pneumonia due to COVID-19 virus-patient agreeable to Decadron and remdesivir.  Also placed on vitamin C and zinc.  Dimer level normal placed on prophylactic Lovenox dosing.  CTA negative for PE showing groundglass opacities.  Supplemental oxygen as needed and wean as tolerates.  Currently on 2 L.  Check CRP.  Check daily inflammatory markers.  Transfer to [employer].  Active Problems:   Acute respiratory failure due to COVID-19 (HCC)-currently on 2 L of oxygen and mentating normally    CKD (chronic kidney disease) stage 3, GFR 30-59 ml/min-stable at baseline    CAD in native artery-stable    CHF (congestive heart failure) (HCC)-currently compensated and stable    Anxiety-stable    Memory loss-mental status clear at this time.     DVT prophylaxis: Lovenox Code Status: Full  Family Communication: None Disposition Plan: 1 to 3 days Consults called: None Admission status: Admission   Mailey Landstrom A MD [facility]  If 7PM-7AM, please contact night-coverage [email or web address] Password [email or web address]  08/17/2019, 12:02 AM

## 2019-08-17 NOTE — Progress Notes (Signed)
Pt transferred from PCU, oriented to unit & call bell system. A&Ox4. VSS, SpO2 >92% on RA. No c/o pain throughout shift.  All safety measures in place, will report to oncoming shift.   [name]

## 2019-08-18 DIAGNOSIS — G250 Essential tremor: Secondary | ICD-10-CM

## 2019-08-18 DIAGNOSIS — C679 Malignant neoplasm of bladder, unspecified: Secondary | ICD-10-CM

## 2019-08-18 DIAGNOSIS — N1831 Chronic kidney disease, stage 3a: Secondary | ICD-10-CM

## 2019-08-18 DIAGNOSIS — I2510 Atherosclerotic heart disease of native coronary artery without angina pectoris: Secondary | ICD-10-CM

## 2019-08-18 DIAGNOSIS — F419 Anxiety disorder, unspecified: Secondary | ICD-10-CM

## 2019-08-18 DIAGNOSIS — R001 Bradycardia, unspecified: Secondary | ICD-10-CM

## 2019-08-18 DIAGNOSIS — R55 Syncope and collapse: Secondary | ICD-10-CM

## 2019-08-18 DIAGNOSIS — J9600 Acute respiratory failure, unspecified whether with hypoxia or hypercapnia: Secondary | ICD-10-CM

## 2019-08-18 DIAGNOSIS — F320 Major depressive disorder, single episode, mild: Secondary | ICD-10-CM

## 2019-08-18 DIAGNOSIS — J9601 Acute respiratory failure with hypoxia: Secondary | ICD-10-CM

## 2019-08-18 DIAGNOSIS — R413 Other amnesia: Secondary | ICD-10-CM

## 2019-08-18 LAB — CBC WITH DIFFERENTIAL/PLATELET
Abs Immature Granulocytes: 0.04 10*3/uL (ref 0.00–0.07)
Basophils Absolute: 0 10*3/uL (ref 0.0–0.1)
Basophils Relative: 0 %
Eosinophils Absolute: 0 10*3/uL (ref 0.0–0.5)
Eosinophils Relative: 0 %
HCT: 45.5 % (ref 39.0–52.0)
Hemoglobin: 15.1 g/dL (ref 13.0–17.0)
Immature Granulocytes: 1 %
Lymphocytes Relative: 8 %
Lymphs Abs: 0.7 10*3/uL (ref 0.7–4.0)
MCH: 31.9 pg (ref 26.0–34.0)
MCHC: 33.2 g/dL (ref 30.0–36.0)
MCV: 96 fL (ref 80.0–100.0)
Monocytes Absolute: 0.3 10*3/uL (ref 0.1–1.0)
Monocytes Relative: 4 %
Neutro Abs: 7.4 10*3/uL (ref 1.7–7.7)
Neutrophils Relative %: 87 %
Platelets: 274 10*3/uL (ref 150–400)
RBC: 4.74 MIL/uL (ref 4.22–5.81)
RDW: 13.4 % (ref 11.5–15.5)
WBC: 8.5 10*3/uL (ref 4.0–10.5)
nRBC: 0 % (ref 0.0–0.2)

## 2019-08-18 LAB — MAGNESIUM: Magnesium: 2 mg/dL (ref 1.7–2.4)

## 2019-08-18 LAB — COMPREHENSIVE METABOLIC PANEL
ALT: 27 U/L (ref 0–44)
AST: 26 U/L (ref 15–41)
Albumin: 3.8 g/dL (ref 3.5–5.0)
Alkaline Phosphatase: 84 U/L (ref 38–126)
Anion gap: 10 (ref 5–15)
BUN: 15 mg/dL (ref 8–23)
CO2: 23 mmol/L (ref 22–32)
Calcium: 9.4 mg/dL (ref 8.9–10.3)
Chloride: 103 mmol/L (ref 98–111)
Creatinine, Ser: 0.87 mg/dL (ref 0.61–1.24)
GFR calc Af Amer: >60 mL/min (ref >60)
GFR calc non Af Amer: >60 mL/min (ref >60)
Glucose, Bld: 144 mg/dL — ABNORMAL HIGH (ref 70–99)
Potassium: 4.2 mmol/L (ref 3.5–5.1)
Sodium: 136 mmol/L (ref 135–145)
Total Bilirubin: 0.2 mg/dL — ABNORMAL LOW (ref 0.3–1.2)
Total Protein: 7.3 g/dL (ref 6.5–8.1)

## 2019-08-18 LAB — GLUCOSE, CAPILLARY
Glucose-Capillary: 126 mg/dL — ABNORMAL HIGH (ref 70–99)
Glucose-Capillary: 133 mg/dL — ABNORMAL HIGH (ref 70–99)
Glucose-Capillary: 148 mg/dL — ABNORMAL HIGH (ref 70–99)
Glucose-Capillary: 150 mg/dL — ABNORMAL HIGH (ref 70–99)
Glucose-Capillary: 151 mg/dL — ABNORMAL HIGH (ref 70–99)
Glucose-Capillary: 220 mg/dL — ABNORMAL HIGH (ref 70–99)
Glucose-Capillary: 233 mg/dL — ABNORMAL HIGH (ref 70–99)

## 2019-08-18 LAB — HEMOGLOBIN A1C
Hgb A1c MFr Bld: 6 % — ABNORMAL HIGH (ref 4.8–5.6)
Mean Plasma Glucose: 125.5 mg/dL

## 2019-08-18 LAB — C-REACTIVE PROTEIN: CRP: 0.6 mg/dL (ref <1.0)

## 2019-08-18 LAB — D-DIMER, QUANTITATIVE: D-Dimer, Quant: 0.29 ug/mL-FEU (ref 0.00–0.50)

## 2019-08-18 LAB — PHOSPHORUS: Phosphorus: 3.6 mg/dL (ref 2.5–4.6)

## 2019-08-18 LAB — FERRITIN: Ferritin: 65 ng/mL (ref 24–336)

## 2019-08-18 MED ORDER — ASPIRIN EC 81 MG PO TBEC
81.0000 mg | DELAYED_RELEASE_TABLET | Freq: Every day | ORAL | Status: DC
  Administered 2019-08-18 – 2019-08-21 (×4): 81 mg via ORAL
  Filled 2019-08-18 (×4): qty 1

## 2019-08-18 MED ORDER — INSULIN ASPART 100 UNIT/ML ~~LOC~~ SOLN
4.0000 [IU] | Freq: Three times a day (TID) | SUBCUTANEOUS | Status: DC
  Administered 2019-08-18 – 2019-08-21 (×8): 4 [IU] via SUBCUTANEOUS

## 2019-08-18 NOTE — Progress Notes (Addendum)
Inpatient Diabetes Program Recommendations  AACE/ADA: New Consensus Statement on Inpatient Glycemic Control (2015)  Target Ranges:  Prepandial:   less than 140 mg/dL      Peak postprandial:   less than 180 mg/dL (1-2 hours)      Critically ill patients:  140 - 180 mg/dL   Lab Results  Component Value Date   GLUCAP 233 (H) 08/18/2019   HGBA1C 6.0 (H) 08/18/2019    Review of Glycemic Control  Diabetes history: None Outpatient Diabetes medications: None Current orders for Inpatient glycemic control: Novolog 0-15 Q4H + decadron 6 mg Q12H  Inpatient Diabetes Program Recommendations:     -If steroids continue please consider Novolog 4 units TID with meals if eats at least 50% of meal -Carb modified diet   Thank you, Reche Dixon, RN, BSN Diabetes Coordinator Inpatient Diabetes Program [phone] (team pager from 8a-5p)

## 2019-08-18 NOTE — Progress Notes (Signed)
   08/18/19 1800  Family/Significant Other Communication  Family/Significant Other Update Called;Updated (spoke with [place] and [employer])

## 2019-08-18 NOTE — Progress Notes (Addendum)
PROGRESS NOTE    [name]  [id number] DOB: 02-07-1936 DOA: 08/16/2019 PCP: Biagio Borg, MD   Brief Narrative:   84 y.o. WM PMHx , Anxiety, Depression,Essential Tremor, vertigo intermittent Hx Bladder cancer, Rheumatoid Arthritis, Bradycardia, CAD, essential HTN, HLD, chronic low back pain, Hx nephrolithiasis, CKD stage III, Hx peptic ulcer,  Comes in with worsening shortness of breath and body aches and chest pain that occurred over the last several days.  Patient has diagnosed with Covid on July 22, 2019 and has been doing well up until last several days.  He was found to be hypoxic in the follow-up clinic with O2 sats of 88%.  He denies any nausea vomiting or diarrhea.  Denies any abdominal pain.  Denies any lower extremity swelling or edema.  Patient found to have bilateral opacities referred for admission for acute hypoxic respiratory failure secondary to bilateral Covid pneumonia.   Subjective: 2/4 afebrile overnight A/O x4, negative S OB.   Assessment & Plan:   Principal Problem:   Pneumonia due to COVID-19 virus Active Problems:   Malignant neoplasm of bladder (HCC)   CKD (chronic kidney disease) stage 3, GFR 30-59 ml/min   Rheumatoid arthritis (HCC)   Depression   Syncope   Spinal stenosis, lumbar   Left lumbar radiculopathy   Vertigo   Bradycardia   CAD in native artery   Essential tremor   Anxiety   Memory loss   Acute respiratory failure due to COVID-19 [facility])   Covid pneumonia/acute respiratory failure with hypoxia COVID-19 Labs  Recent Labs    08/16/19 2200 08/17/19 1025 08/18/19 0433  DDIMER 0.39 0.30 0.29  FERRITIN 67 62 65  LDH 141  --   --   CRP 0.6 0.8 0.6    Lab Results  Component Value Date   SARSCOV2NAA POSITIVE (A) 07/22/2019   SARSCOV2NAA Detected (A) 07/21/2019   SARSCOV2NAA Not Detected 06/03/2019   Bradycardia -Currently NSR -ASA 81 mg daily -Amlodipine 10 mg daily -Hydralazine 10 mg TID PRN -Losartan 100  mg   Syncope -Orthostatic vitals q shift -Negative syncopal events during this hospitalization -Strict in and out -Daily weight -PT/OT to evaluate  CAD in native artery -See bradycardia  CKD stage III -Normal kidney function.  CKD stage III ruled out. -Continue to monitor closely Recent Labs  Lab 08/16/19 2200 08/17/19 1025 08/18/19 0433  CREATININE 0.83 0.92 0.87   Hx bladder cancer -Stable  Memory loss -Must be very mild as patient able to answer all questions appropriately  Anxiety/depression -Celexa 10 mg daily -Clonazepam 0.5 mg daily  Essential tremors -Primidone 125 mg TID  Prediabetes -2/4 Hemoglobin A1c = 6.0.  Under ADA guidelines patient meets criteria for prediabetes -2/4 NovoLog 4 units qac -Moderate SSI   Goals of care -2/4 PT/OT consult; evaluate for SNF vs home health    DVT prophylaxis: Lovenox Code Status:  Family Communication:  2/4 spoke with Lattie Haw (daughter) discussed plan and answered all questions  Disposition Plan:    Consultants:    Procedures/Significant Events:     I have personally reviewed and interpreted all radiology studies and my findings are as above.  VENTILATOR SETTINGS: Room air 2/4 SPO2 96%   Cultures   Antimicrobials: Anti-infectives (From admission, onward)   Start     Dose/Rate Stop   08/18/19 1000  remdesivir 100 mg in sodium chloride 0.9 % 100 mL IVPB     100 mg 200 mL/hr over 30 Minutes 08/22/19 0959   08/17/19 1000  remdesivir 100 mg in sodium chloride 0.9 % 100 mL IVPB  Status:  Discontinued     100 mg 200 mL/hr over 30 Minutes 08/16/19 2356   08/17/19 0000  remdesivir 200 mg in sodium chloride 0.9% 250 mL IVPB  Status:  Discontinued     200 mg 580 mL/hr over 30 Minutes 08/16/19 2356   08/16/19 2359  remdesivir 200 mg in sodium chloride 0.9% 250 mL IVPB     200 mg 580 mL/hr over 30 Minutes 08/17/19 0108   08/16/19 2300  cefTRIAXone (ROCEPHIN) 1 g in sodium chloride 0.9 % 100 mL IVPB      1 g 200 mL/hr over 30 Minutes 08/17/19 0012   08/16/19 2300  azithromycin (ZITHROMAX) 500 mg in sodium chloride 0.9 % 250 mL IVPB     500 mg 250 mL/hr over 60 Minutes 08/17/19 0108       Devices    LINES / TUBES:      Continuous Infusions: . sodium chloride 1,000 mL (08/16/19 2235)  . sodium chloride    . remdesivir 100 mg in NS 100 mL 100 mg (08/18/19 0845)     Objective: Vitals:   08/17/19 1621 08/17/19 1930 08/18/19 0416 08/18/19 0743  BP: 125/62 122/66 (!) 141/69 140/66  Pulse: 67 74 (!) 59 (!) 55  Resp: 18 18 20    Temp: 98.2 F (36.8 C) 97.9 F (36.6 C) 98.1 F (36.7 C) 97.8 F (36.6 C)  TempSrc: Oral Oral Oral Oral  SpO2: 92% 94% 95% 96%  Weight:      Height:        Intake/Output Summary (Last 24 hours) at 08/18/2019 1146 Last data filed at 08/18/2019 0500 Gross per 24 hour  Intake 0 ml  Output --  Net 0 ml   Filed Weights   08/17/19 0255  Weight: 89 kg   Physical Exam:  General: A/O x4, no acute respiratory distress Eyes: negative scleral hemorrhage, negative anisocoria, negative icterus ENT: Negative Runny nose, negative gingival bleeding, Neck:  Negative scars, masses, torticollis, lymphadenopathy, JVD Lungs: Clear to auscultation bilaterally without wheezes or crackles Cardiovascular: Regular rate and rhythm without murmur gallop or rub normal S1 and S2 Abdomen: negative abdominal pain, nondistended, positive soft, bowel sounds, no rebound, no ascites, no appreciable mass Extremities: No significant cyanosis, clubbing, or edema bilateral lower extremities Skin: Negative rashes, lesions, ulcers Psychiatric:  Negative depression, negative anxiety, negative fatigue, negative mania  Central nervous system:  Cranial nerves II through XII intact, tongue/uvula midline, all extremities muscle strength 5/5, sensation intact throughout, negative dysarthria, negative expressive aphasia, negative receptive aphasia. .     Data Reviewed: Care during the  described time interval was provided by me .  I have reviewed this patient's available data, including medical history, events of note, physical examination, and all test results as part of my evaluation.   CBC: Recent Labs  Lab 08/16/19 2200 08/17/19 1025 08/18/19 0433  WBC 8.3 4.1 8.5  NEUTROABS 5.8 3.4 7.4  HGB 15.2 13.6 15.1  HCT 45.7 40.7 45.5  MCV 95.8 94.9 96.0  PLT 273 273 [id number]   Basic Metabolic Panel: Recent Labs  Lab 08/16/19 2200 08/17/19 1025 08/18/19 0433  NA 138 136 136  K 3.7 3.9 4.2  CL 102 104 103  CO2 25 23 23   GLUCOSE 113* 228* 144*  BUN 10 9 15   CREATININE 0.83 0.92 0.87  CALCIUM 9.5 8.9 9.4  MG  --  1.8 2.0  PHOS  --  2.2* 3.6   GFR: Estimated Creatinine Clearance: 68.4 mL/min (by C-G formula based on SCr of 0.87 mg/dL). Liver Function Tests: Recent Labs  Lab 08/16/19 2200 08/17/19 1025 08/18/19 0433  AST 35 28 26  ALT 33 27 27  ALKPHOS 88 81 84  BILITOT 0.7 0.2* 0.2*  PROT 7.7 6.4* 7.3  ALBUMIN 4.1 3.2* 3.8   No results for input(s): LIPASE, AMYLASE in the last 168 hours. No results for input(s): AMMONIA in the last 168 hours. Coagulation Profile: No results for input(s): INR, PROTIME in the last 168 hours. Cardiac Enzymes: No results for input(s): CKTOTAL, CKMB, CKMBINDEX, TROPONINI in the last 168 hours. BNP (last 3 results) No results for input(s): PROBNP in the last 8760 hours. HbA1C: Recent Labs    08/18/19 0433  HGBA1C 6.0*   CBG: Recent Labs  Lab 08/17/19 1937 08/18/19 0006 08/18/19 0414 08/18/19 0729 08/18/19 1109  GLUCAP 255* 133* 151* 126* 233*   Lipid Profile: Recent Labs    08/16/19 2200  TRIG 201*   Thyroid Function Tests: No results for input(s): TSH, T4TOTAL, FREET4, T3FREE, THYROIDAB in the last 72 hours. Anemia Panel: Recent Labs    08/17/19 1025 08/18/19 0433  FERRITIN 62 65   Urine analysis:    Component Value Date/Time   COLORURINE AMBER (A) 07/22/2019 1954   APPEARANCEUR CLEAR  07/22/2019 1954   LABSPEC 1.030 07/22/2019 1954   PHURINE 5.0 07/22/2019 1954   GLUCOSEU NEGATIVE 07/22/2019 1954   GLUCOSEU NEGATIVE 06/24/2019 [place] 07/22/2019 [place] NEGATIVE 07/22/2019 1954   KETONESUR 5 (A) 07/22/2019 1954   PROTEINUR 30 (A) 07/22/2019 1954   UROBILINOGEN 0.2 06/24/2019 1204   NITRITE NEGATIVE 07/22/2019 1954   LEUKOCYTESUR NEGATIVE 07/22/2019 1954   Sepsis Labs: @LABRCNTIP (procalcitonin:4,lacticidven:4)  ) Recent Results (from the past 240 hour(s))  Blood Culture (routine x 2)     Status: None (Preliminary result)   Collection Time: 08/16/19  9:39 PM   Specimen: BLOOD  Result Value Ref Range Status   Specimen Description   Final    BLOOD LEFT ANTECUBITAL Performed at [facility], [place] [address], [place], [place] [address]    Special Requests   Final    BOTTLES DRAWN AEROBIC AND ANAEROBIC Blood Culture adequate volume Performed at [place] [address], [place], [place] [address]    Culture   Final    NO GROWTH 2 DAYS Performed at [facility] [address], [place], [place] [address]    Report Status PENDING  Incomplete         Radiology Studies: CT Angio Chest PE W and/or Wo Contrast  Result Date: 08/17/2019 CLINICAL DATA:  PE suspected. Increasing shortness of breath. COVID-19 positive. EXAM: CT ANGIOGRAPHY CHEST WITH CONTRAST TECHNIQUE: Multidetector CT imaging of the chest was performed using the standard protocol during bolus administration of intravenous contrast. Multiplanar CT image reconstructions and MIPs were obtained to evaluate the vascular anatomy. CONTRAST:  155mL OMNIPAQUE IOHEXOL 350 MG/ML SOLN COMPARISON:  CT chest dated 02/24/2019 FINDINGS: Cardiovascular: Evaluation is limited by respiratory motion artifact.There is no pulmonary embolus. The main pulmonary artery is within normal limits for size. There is no CT evidence of acute right heart strain.  There are atherosclerotic changes of the thoracic aorta without evidence for a thoracic aortic aneurysm. Heart size is the heart size is borderline enlarged. Coronary artery calcifications are noted. Mediastinum/Nodes: --No mediastinal or hilar lymphadenopathy. --No axillary lymphadenopathy. --No  supraclavicular lymphadenopathy. --Normal thyroid gland. --The esophagus is unremarkable Lungs/Pleura: There are scattered diffuse bilateral ground-glass airspace opacities. Emphysematous changes are noted. There are trace bilateral pleural effusions, right greater than left. There is no pneumothorax. Upper Abdomen: No acute abnormality. Musculoskeletal: No chest wall abnormality. No acute or significant osseous findings. Review of the MIP images confirms the above findings. IMPRESSION: 1. Evaluation is limited by respiratory motion artifact. 2. No evidence for acute pulmonary embolus. 3. Diffuse bilateral ground-glass airspace opacities, which can be seen in patients with COVID-19 pneumonia. 4. Trace bilateral pleural effusions. 5. Borderline cardiomegaly. 6. Coronary artery calcifications. Aortic Atherosclerosis (ICD10-I70.0) and Emphysema (ICD10-J43.9). Electronically Signed   By: Constance Holster M.D.   On: 08/17/2019 00:16   DG Chest Port 1 View  Result Date: 08/16/2019 CLINICAL DATA:  Shortness of breath and generalized weakness. EXAM: PORTABLE CHEST 1 VIEW COMPARISON:  July 22, 2019 FINDINGS: Mild to moderate severity infiltrates are seen within the right lung base and along the periphery of the mid to upper right lung. Mild atelectasis is noted within the left lung base. Small bilateral pleural effusions are seen. There is no evidence of a pneumothorax. The heart size and mediastinal contours are within normal limits. There is stable spinal stimulator wire positioning. Degenerative changes seen throughout the thoracic spine. IMPRESSION: 1. Mild to moderate severity right-sided infiltrate with mild left  basilar atelectasis. This represents a new finding when compared to the prior study dated July 22, 2019. Electronically Signed   By: Virgina Norfolk M.D.   On: 08/16/2019 22:16        Scheduled Meds: . amLODipine  10 mg Oral Daily  . vitamin C  500 mg Oral Daily  . brimonidine  1 drop Both Eyes BID  . citalopram  10 mg Oral Daily  . clonazePAM  0.5 mg Oral Daily  . dexamethasone (DECADRON) injection  6 mg Intravenous Q12H  . enoxaparin (LOVENOX) injection  40 mg Subcutaneous Q24H  . gabapentin  300 mg Oral QHS  . insulin aspart  0-15 Units Subcutaneous Q4H  . losartan  100 mg Oral Daily  . primidone  125 mg Oral TID  . sodium chloride flush  3 mL Intravenous Q12H  . timolol  1 drop Both Eyes BID  . zinc sulfate  220 mg Oral Daily   Continuous Infusions: . sodium chloride 1,000 mL (08/16/19 2235)  . sodium chloride    . remdesivir 100 mg in NS 100 mL 100 mg (08/18/19 0845)     LOS: 2 days   The patient is critically ill with multiple organ systems failure and requires high complexity decision making for assessment and support, frequent evaluation and titration of therapies, application of advanced monitoring technologies and extensive interpretation of multiple databases. Critical Care Time devoted to patient care services described in this note  Time spent: 40 minutes     Danni Leabo, Geraldo Docker, MD [facility] Pager [phone]  If 7PM-7AM, please contact night-coverage [email or web address] Password [facility] 08/18/2019, 11:46 AM

## 2019-08-18 NOTE — Plan of Care (Signed)

## 2019-08-19 LAB — CBC WITH DIFFERENTIAL/PLATELET
Abs Immature Granulocytes: 0.05 10*3/uL (ref 0.00–0.07)
Basophils Absolute: 0 10*3/uL (ref 0.0–0.1)
Basophils Relative: 0 %
Eosinophils Absolute: 0 10*3/uL (ref 0.0–0.5)
Eosinophils Relative: 0 %
HCT: 42.4 % (ref 39.0–52.0)
Hemoglobin: 13.8 g/dL (ref 13.0–17.0)
Immature Granulocytes: 1 %
Lymphocytes Relative: 8 %
Lymphs Abs: 0.6 10*3/uL — ABNORMAL LOW (ref 0.7–4.0)
MCH: 31.4 pg (ref 26.0–34.0)
MCHC: 32.5 g/dL (ref 30.0–36.0)
MCV: 96.6 fL (ref 80.0–100.0)
Monocytes Absolute: 0.4 10*3/uL (ref 0.1–1.0)
Monocytes Relative: 5 %
Neutro Abs: 7.5 10*3/uL (ref 1.7–7.7)
Neutrophils Relative %: 86 %
Platelets: 252 10*3/uL (ref 150–400)
RBC: 4.39 MIL/uL (ref 4.22–5.81)
RDW: 13.5 % (ref 11.5–15.5)
WBC: 8.6 10*3/uL (ref 4.0–10.5)
nRBC: 0 % (ref 0.0–0.2)

## 2019-08-19 LAB — COMPREHENSIVE METABOLIC PANEL
ALT: 31 U/L (ref 0–44)
AST: 28 U/L (ref 15–41)
Albumin: 3.4 g/dL — ABNORMAL LOW (ref 3.5–5.0)
Alkaline Phosphatase: 77 U/L (ref 38–126)
Anion gap: 7 (ref 5–15)
BUN: 19 mg/dL (ref 8–23)
CO2: 26 mmol/L (ref 22–32)
Calcium: 9.2 mg/dL (ref 8.9–10.3)
Chloride: 104 mmol/L (ref 98–111)
Creatinine, Ser: 0.86 mg/dL (ref 0.61–1.24)
GFR calc Af Amer: >60 mL/min (ref >60)
GFR calc non Af Amer: >60 mL/min (ref >60)
Glucose, Bld: 162 mg/dL — ABNORMAL HIGH (ref 70–99)
Potassium: 4.4 mmol/L (ref 3.5–5.1)
Sodium: 137 mmol/L (ref 135–145)
Total Bilirubin: 0.3 mg/dL (ref 0.3–1.2)
Total Protein: 6.6 g/dL (ref 6.5–8.1)

## 2019-08-19 LAB — PHOSPHORUS: Phosphorus: 3.4 mg/dL (ref 2.5–4.6)

## 2019-08-19 LAB — GLUCOSE, CAPILLARY
Glucose-Capillary: 157 mg/dL — ABNORMAL HIGH (ref 70–99)
Glucose-Capillary: 121 mg/dL — ABNORMAL HIGH (ref 70–99)
Glucose-Capillary: 201 mg/dL — ABNORMAL HIGH (ref 70–99)
Glucose-Capillary: 231 mg/dL — ABNORMAL HIGH (ref 70–99)

## 2019-08-19 LAB — C-REACTIVE PROTEIN: CRP: 0.5 mg/dL (ref <1.0)

## 2019-08-19 LAB — FERRITIN: Ferritin: 53 ng/mL (ref 24–336)

## 2019-08-19 LAB — MAGNESIUM: Magnesium: 2.1 mg/dL (ref 1.7–2.4)

## 2019-08-19 LAB — D-DIMER, QUANTITATIVE: D-Dimer, Quant: 0.52 ug/mL-FEU — ABNORMAL HIGH (ref 0.00–0.50)

## 2019-08-19 MED ORDER — INSULIN DETEMIR 100 UNIT/ML ~~LOC~~ SOLN
5.0000 [IU] | Freq: Two times a day (BID) | SUBCUTANEOUS | Status: DC
  Administered 2019-08-19 – 2019-08-21 (×4): 5 [IU] via SUBCUTANEOUS
  Filled 2019-08-19 (×5): qty 0.05

## 2019-08-19 MED ORDER — HYDRALAZINE HCL 10 MG PO TABS
5.0000 mg | ORAL_TABLET | Freq: Three times a day (TID) | ORAL | Status: DC
  Administered 2019-08-19 – 2019-08-20 (×4): 5 mg via ORAL
  Filled 2019-08-19 (×6): qty 1

## 2019-08-19 NOTE — Progress Notes (Addendum)
Occupational Therapy Evaluation Patient Details Name: [name] MRN: [id number] DOB: August 07, 1935 Today's Date: 08/19/2019    History of Present Illness 84 y.o.WM PMHx , Anxiety, Depression,Essential Tremor, vertigo intermittent Hx Bladder cancer, Rheumatoid Arthritis, Bradycardia, CAD, essential HTN, HLD, chronic low back pain, Hx nephrolithiasis, CKD stage III, Hx peptic ulcer,   Clinical Impression   PTA patient was Modified Independent with all self-care requiring extra time and use of rollator and shower seat. Patient was A &O x 4 with proper recall of flutter valve and IS (1739mL) during OT eval. Patient was able to mobilize around room with no AE on room air with stats in the 90s. Patient was able to complete sponge bath, grooming, and UE dressing while standing at sink at set-up/Supv level. Dyspnea scale of 1 with patient verbalizing slight fatigue. Patient will benefit from continued skilled OT services to advance to Modified (I) level on room air with stats above 90% throughout ADL session.      Follow Up Recommendations  No OT follow up    Equipment Recommendations  (Make sure to continue using shower chair in home )    Recommendations for Other Services       Precautions / Restrictions Precautions Precautions: (Airborne) Restrictions Weight Bearing Restrictions: No      Mobility Bed Mobility               General bed mobility comments: (Patient sitting EOB upon arrival in room)  Transfers Overall transfer level: Modified independent Equipment used: None                  Balance Overall balance assessment: Modified Independent                                         ADL either performed or assessed with clinical judgement   ADL Overall ADL's : Needs assistance/impaired Eating/Feeding: Independent   Grooming: Set up   Upper Body Bathing: Set up;Supervision/ safety   Lower Body Bathing: Set up;Supervison/ safety   Upper Body  Dressing : Set up   Lower Body Dressing: Set up   Toilet Transfer: Supervision/safety   Toileting- Clothing Manipulation and Hygiene: Supervision/safety       Functional mobility during ADLs: Supervision/safety       Vision Baseline Vision/History: Wears glasses Wears Glasses: Reading only       Perception     Praxis      Pertinent Vitals/Pain Pain Assessment: 0-10 Pain Score: 2  Pain Location: chest  Pain Descriptors / Indicators: Discomfort Pain Intervention(s): Relaxation     Hand Dominance Right   Extremity/Trunk Assessment Upper Extremity Assessment Upper Extremity Assessment: Generalized weakness;RUE deficits/detail;LUE deficits/detail RUE: (ROM WFL, GROSSLY 4-/5) LUE: (ROM WFL, GROSSLY 4-/5)   Lower Extremity Assessment Lower Extremity Assessment: Defer to PT evaluation       Communication Communication Communication: No difficulties   Cognition Arousal/Alertness: Awake/alert Behavior During Therapy: WFL for tasks assessed/performed Overall Cognitive Status: Within Functional Limits for tasks assessed                                 General Comments: Very pleasant and cooperative    General Comments  redness in sacral area    Exercises Exercises: Other exercises Other Exercises Other Exercises: flutter valve(for 10 reps with hold time of 5 seconds )  Other Exercises: IS (for 10 times with average of 1762mL)   Shoulder Instructions      Home Living Family/patient expects to be discharged to:: Private residence Living Arrangements: (Son and Daughter in Sports coach reside with patient) Available Help at Discharge: Family Type of Home: House Home Access: Ramped entrance     [place]: One level     Bathroom Shower/Tub: Teacher, early years/pre: Standard Bathroom Accessibility: Yes How Accessible: Accessible via wheelchair;Accessible via walker Home Equipment: Shower seat;Grab bars - tub/shower          Prior  Functioning/Environment Level of Independence: Independent with assistive device(s)        Comments: Rollator to mobilize for community distances only         OT Problem List: Decreased strength;Decreased activity tolerance;Decreased knowledge of use of DME or AE      OT Treatment/Interventions: Self-care/ADL training;Therapeutic exercise;Energy conservation;DME and/or AE instruction;Splinting;Patient/family education    OT Goals(Current goals can be found in the care plan section) Acute Rehab OT Goals Patient Stated Goal: to go home  OT Goal Formulation: With patient Time For Goal Achievement: 09/02/19 Potential to Achieve Goals: Good ADL Goals Pt Will Perform Grooming: with modified independence Pt Will Perform Upper Body Dressing: with modified independence Pt Will Perform Lower Body Dressing: with modified independence Pt/caregiver will Perform Home Exercise Program: With written HEP provided;Independently;Increased strength Additional ADL Goal #1: Patient will verbalize and incorporate 3 energy conservation techniques during routine and self-care tasks with Modified Independence.  OT Frequency: Min 3X/week   Barriers to D/C:    No Barriers       Co-evaluation              AM-PAC OT "6 Clicks" Daily Activity     Outcome Measure Help from another person eating meals?: None Help from another person taking care of personal grooming?: A Little Help from another person toileting, which includes using toliet, bedpan, or urinal?: None Help from another person bathing (including washing, rinsing, drying)?: A Little Help from another person to put on and taking off regular upper body clothing?: A Little Help from another person to put on and taking off regular lower body clothing?: A Little 6 Click Score: 20   End of Session Equipment Utilized During Treatment: Gait belt Nurse Communication: Mobility status  Activity Tolerance: Patient tolerated treatment well Patient  left: in chair;with call bell/phone within reach  OT Visit Diagnosis: Muscle weakness (generalized) (M62.81)                Time: [id number] OT Time Calculation (min): 40 min Charges:  OT General Charges $OT Visit: 1 Visit OT Evaluation $OT Eval Moderate Complexity: 1 Mod OT Treatments $Self Care/Home Management : 8-22 mins $Therapeutic Exercise: 8-22 mins  Cleven Jansma OTR/L   Etienne Mowers 08/19/2019, 10:37 AM

## 2019-08-19 NOTE — Progress Notes (Signed)
PROGRESS NOTE    [name]  [id number] DOB: Feb 25, 1936 DOA: 08/16/2019 PCP: Biagio Borg, MD   Brief Narrative:   84 y.o. WM PMHx , Anxiety, Depression,Essential Tremor, vertigo intermittent Hx Bladder cancer, Rheumatoid Arthritis, Bradycardia, CAD, essential HTN, HLD, chronic low back pain, Hx nephrolithiasis, CKD stage III, Hx peptic ulcer,  Comes in with worsening shortness of breath and body aches and chest pain that occurred over the last several days.  Patient has diagnosed with Covid on July 22, 2019 and has been doing well up until last several days.  He was found to be hypoxic in the follow-up clinic with O2 sats of 88%.  He denies any nausea vomiting or diarrhea.  Denies any abdominal pain.  Denies any lower extremity swelling or edema.  Patient found to have bilateral opacities referred for admission for acute hypoxic respiratory failure secondary to bilateral Covid pneumonia.   Subjective: 2/5 afebrile last 24 hours, A/O x4, negative S OB   Assessment & Plan:   Principal Problem:   Pneumonia due to COVID-19 virus Active Problems:   Malignant neoplasm of bladder (HCC)   CKD (chronic kidney disease) stage 3, GFR 30-59 ml/min   Rheumatoid arthritis (HCC)   Depression   Syncope   Spinal stenosis, lumbar   Left lumbar radiculopathy   Vertigo   Bradycardia   CAD in native artery   Essential tremor   Anxiety   Memory loss   Acute respiratory failure due to COVID-19 [facility])   Covid pneumonia/acute respiratory failure with hypoxia COVID-19 Labs  Recent Labs    08/16/19 2200 08/16/19 2200 08/17/19 1025 08/18/19 0433 08/19/19 0357  DDIMER 0.39   < > 0.30 0.29 0.52*  FERRITIN 67   < > 62 65 53  LDH 141  --   --   --   --   CRP 0.6   < > 0.8 0.6 0.5   < > = values in this interval not displayed.    Lab Results  Component Value Date   SARSCOV2NAA POSITIVE (A) 07/22/2019   SARSCOV2NAA Detected (A) 07/21/2019   [place] Not Detected 06/03/2019    -Decadron 6 mg daily -Remdesivir per pharmacy protocol (end date 2/7) -Albuterol -Vitamins per Covid protocol -Flutter valve -Incentive spirometry -Titrate O2 to maintain SPO2> 88% -2/5 ambulatory SPO2 pending  Bradycardia -Currently NSR -ASA 81 mg daily -Amlodipine 10 mg daily -2/5 change Hydralazine 5 mg TID -Losartan 100 mg  Syncope -Orthostatic vitals q shift -Negative syncopal events during this hospitalization -Strict in and out +81ml -Daily weight Filed Weights   08/17/19 0255  Weight: 89 kg  -PT/OT to evaluate  CAD in native artery -See bradycardia  CKD stage III -Normal kidney function.  CKD stage III ruled out. -Continue to monitor closely Recent Labs  Lab 08/16/19 2200 08/17/19 1025 08/18/19 0433 08/19/19 0357  CREATININE 0.83 0.92 0.87 0.86   Hx bladder cancer -Stable  Memory loss -Must be very mild as patient able to answer all questions appropriately  Anxiety/depression -Celexa 10 mg daily -Clonazepam 0.5 mg daily  Essential tremors -Primidone 125 mg TID  Prediabetes -2/4 Hemoglobin A1c = 6.0.  Under ADA guidelines patient meets criteria for prediabetes -NovoLog 4 units qac -2/5 Levemir 5 units BID -Moderate SSI   Goals of care -2/4 PT/OT consult; no follow-up required   DVT prophylaxis: Lovenox Code Status:  Family Communication:  2/4 spoke with Lattie Haw (daughter) discussed plan and answered all questions  Disposition Plan:    Consultants:  Procedures/Significant Events:     I have personally reviewed and interpreted all radiology studies and my findings are as above.  VENTILATOR SETTINGS: Room air 2/5 SPO2 93%   Cultures   Antimicrobials: Anti-infectives (From admission, onward)   Start     Dose/Rate Stop   08/18/19 1000  remdesivir 100 mg in sodium chloride 0.9 % 100 mL IVPB     100 mg 200 mL/hr over 30 Minutes 08/22/19 0959   08/17/19 1000  remdesivir 100 mg in sodium chloride 0.9 % 100 mL IVPB  Status:   Discontinued     100 mg 200 mL/hr over 30 Minutes 08/16/19 2356   08/17/19 0000  remdesivir 200 mg in sodium chloride 0.9% 250 mL IVPB  Status:  Discontinued     200 mg 580 mL/hr over 30 Minutes 08/16/19 2356   08/16/19 2359  remdesivir 200 mg in sodium chloride 0.9% 250 mL IVPB     200 mg 580 mL/hr over 30 Minutes 08/17/19 0108   08/16/19 2300  cefTRIAXone (ROCEPHIN) 1 g in sodium chloride 0.9 % 100 mL IVPB     1 g 200 mL/hr over 30 Minutes 08/17/19 0012   08/16/19 2300  azithromycin (ZITHROMAX) 500 mg in sodium chloride 0.9 % 250 mL IVPB     500 mg 250 mL/hr over 60 Minutes 08/17/19 0108       Devices    LINES / TUBES:      Continuous Infusions: . sodium chloride 1,000 mL (08/16/19 2235)  . sodium chloride    . remdesivir 100 mg in NS 100 mL 100 mg (08/19/19 0849)     Objective: Vitals:   08/18/19 1629 08/18/19 1900 08/19/19 0300 08/19/19 0725  BP: (!) 129/59 (!) 143/64 (!) 146/75 (!) 143/68  Pulse: 66 66 60 (!) 56  Resp:  20 (!) 22 18  Temp: 97.9 F (36.6 C) 97.6 F (36.4 C) 97.6 F (36.4 C) 97.8 F (36.6 C)  TempSrc: Oral Oral Other (Comment) Oral  SpO2: 96%   93%  Weight:      Height:        Intake/Output Summary (Last 24 hours) at 08/19/2019 [id number] Last data filed at 08/19/2019 [id number] Gross per 24 hour  Intake 1220 ml  Output 1635 ml  Net -415 ml   Filed Weights   08/17/19 0255  Weight: 89 kg    Physical Exam:  General: A/O x4 no acute respiratory distress Eyes: negative scleral hemorrhage, negative anisocoria, negative icterus ENT: Negative Runny nose, negative gingival bleeding, Neck:  Negative scars, masses, torticollis, lymphadenopathy, JVD Lungs: Clear to auscultation bilaterally without wheezes or crackles Cardiovascular: Regular rate and rhythm without murmur gallop or rub normal S1 and S2 Abdomen: negative abdominal pain, nondistended, positive soft, bowel sounds, no rebound, no ascites, no appreciable mass Extremities: No significant  cyanosis, clubbing, or edema bilateral lower extremities Skin: Negative rashes, lesions, ulcers Psychiatric:  Negative depression, negative anxiety, negative fatigue, negative mania  Central nervous system:  Cranial nerves II through XII intact, tongue/uvula midline, all extremities muscle strength 5/5, sensation intact throughout, negative dysarthria, negative expressive aphasia, negative receptive aphasia.      Data Reviewed: Care during the described time interval was provided by me .  I have reviewed this patient's available data, including medical history, events of note, physical examination, and all test results as part of my evaluation.   CBC: Recent Labs  Lab 08/16/19 2200 08/17/19 1025 08/18/19 0433 08/19/19 0357  WBC 8.3 4.1 8.5 8.6  NEUTROABS 5.8 3.4 7.4 7.5  HGB 15.2 13.6 15.1 13.8  HCT 45.7 40.7 45.5 42.4  MCV 95.8 94.9 96.0 96.6  PLT 273 273 274 [id number]   Basic Metabolic Panel: Recent Labs  Lab 08/16/19 2200 08/17/19 1025 08/18/19 0433 08/19/19 0357  NA 138 136 136 137  K 3.7 3.9 4.2 4.4  CL 102 104 103 104  CO2 25 23 23 26   GLUCOSE 113* 228* 144* 162*  BUN 10 9 15 19   CREATININE 0.83 0.92 0.87 0.86  CALCIUM 9.5 8.9 9.4 9.2  MG  --  1.8 2.0 2.1  PHOS  --  2.2* 3.6 3.4   GFR: Estimated Creatinine Clearance: 69.2 mL/min (by C-G formula based on SCr of 0.86 mg/dL). Liver Function Tests: Recent Labs  Lab 08/16/19 2200 08/17/19 1025 08/18/19 0433 08/19/19 0357  AST 35 28 26 28   ALT 33 27 27 31   ALKPHOS 88 81 84 77  BILITOT 0.7 0.2* 0.2* 0.3  PROT 7.7 6.4* 7.3 6.6  ALBUMIN 4.1 3.2* 3.8 3.4*   No results for input(s): LIPASE, AMYLASE in the last 168 hours. No results for input(s): AMMONIA in the last 168 hours. Coagulation Profile: No results for input(s): INR, PROTIME in the last 168 hours. Cardiac Enzymes: No results for input(s): CKTOTAL, CKMB, CKMBINDEX, TROPONINI in the last 168 hours. BNP (last 3 results) No results for input(s): PROBNP in  the last 8760 hours. HbA1C: Recent Labs    08/18/19 0433  HGBA1C 6.0*   CBG: Recent Labs  Lab 08/18/19 1109 08/18/19 1621 08/18/19 2005 08/18/19 2339 08/19/19 0354  GLUCAP 233* 150* 220* 148* 157*   Lipid Profile: Recent Labs    08/16/19 2200  TRIG 201*   Thyroid Function Tests: No results for input(s): TSH, T4TOTAL, FREET4, T3FREE, THYROIDAB in the last 72 hours. Anemia Panel: Recent Labs    08/18/19 0433 08/19/19 0357  FERRITIN 65 53   Urine analysis:    Component Value Date/Time   COLORURINE AMBER (A) 07/22/2019 1954   APPEARANCEUR CLEAR 07/22/2019 1954   LABSPEC 1.030 07/22/2019 1954   PHURINE 5.0 07/22/2019 1954   GLUCOSEU NEGATIVE 07/22/2019 1954   GLUCOSEU NEGATIVE 06/24/2019 [place] 07/22/2019 [place] NEGATIVE 07/22/2019 1954   KETONESUR 5 (A) 07/22/2019 1954   PROTEINUR 30 (A) 07/22/2019 1954   UROBILINOGEN 0.2 06/24/2019 1204   NITRITE NEGATIVE 07/22/2019 1954   LEUKOCYTESUR NEGATIVE 07/22/2019 1954   Sepsis Labs: @LABRCNTIP (procalcitonin:4,lacticidven:4)  ) Recent Results (from the past 240 hour(s))  Blood Culture (routine x 2)     Status: None (Preliminary result)   Collection Time: 08/16/19  9:39 PM   Specimen: BLOOD  Result Value Ref Range Status   Specimen Description   Final    BLOOD LEFT ANTECUBITAL Performed at [facility], [place] [address] [place], [place] [address]    Special Requests   Final    BOTTLES DRAWN AEROBIC AND ANAEROBIC Blood Culture adequate volume Performed at [facility] [address], [place], [place] [address]    Culture   Final    NO GROWTH 2 DAYS Performed at [facility] [address] [place],  [address]    Report Status PENDING  Incomplete         Radiology Studies: No results found.      Scheduled Meds: . amLODipine  10 mg Oral Daily  . vitamin C  500 mg Oral Daily  . aspirin EC  81  mg Oral Daily  .  brimonidine  1 drop Both Eyes BID  . citalopram  10 mg Oral Daily  . clonazePAM  0.5 mg Oral Daily  . dexamethasone (DECADRON) injection  6 mg Intravenous Q12H  . enoxaparin (LOVENOX) injection  40 mg Subcutaneous Q24H  . gabapentin  300 mg Oral QHS  . insulin aspart  0-15 Units Subcutaneous Q4H  . insulin aspart  4 Units Subcutaneous TID WC  . losartan  100 mg Oral Daily  . primidone  125 mg Oral TID  . sodium chloride flush  3 mL Intravenous Q12H  . timolol  1 drop Both Eyes BID  . zinc sulfate  220 mg Oral Daily   Continuous Infusions: . sodium chloride 1,000 mL (08/16/19 2235)  . sodium chloride    . remdesivir 100 mg in NS 100 mL 100 mg (08/19/19 0849)     LOS: 3 days   The patient is critically ill with multiple organ systems failure and requires high complexity decision making for assessment and support, frequent evaluation and titration of therapies, application of advanced monitoring technologies and extensive interpretation of multiple databases. Critical Care Time devoted to patient care services described in this note  Time spent: 40 minutes     Garik Diamant, Geraldo Docker, MD [facility] Pager [phone]  If 7PM-7AM, please contact night-coverage [email or web address] Password [email or web address] 08/19/2019, 9:07 AM

## 2019-08-19 NOTE — Progress Notes (Signed)
   08/19/19 1437  Family/Significant Other Communication  Family/Significant Other Update Called;Updated (Son- [place])

## 2019-08-19 NOTE — Evaluation (Signed)
Physical Therapy Evaluation & Discharge Patient Details Name: [name] MRN: [id number] DOB: 28-Jul-1935 Today's Date: 08/19/2019   History of Present Illness  Pt is an 84 y.o. male dx with COVID-19 (07/22/19) admitted 08/16/19 with SOB and body achies; found to be hypoxic, CXR with bilateral opacities. PMH includes CAD, HTN, CKD III, chronic LBP, vertigo, bladder CA, essential tremor, anxiety, depression.    Clinical Impression  Patient evaluated by Physical Therapy with no further acute PT needs identified. PTA, pt mod indep with rollator for community distances, lives with family. Today, pt independent with mobility and ADL tasks without DME. DOE up to 2/4 with activity; SpO2 90% on RA. Educ re: activity recommendations, IS use, importance of mobility. All education has been completed and the patient has no further questions. Acute PT is signing off. Thank you for this referral.    Follow Up Recommendations No PT follow up;Supervision - Intermittent    Equipment Recommendations  None recommended by PT    Recommendations for Other Services       Precautions / Restrictions Precautions Precautions: None Restrictions Weight Bearing Restrictions: No      Mobility  Bed Mobility Overal bed mobility: Independent                Transfers Overall transfer level: Independent Equipment used: None                Ambulation/Gait Ambulation/Gait assistance: Independent Gait Distance (Feet): 280 Feet Assistive device: None Gait Pattern/deviations: Step-through pattern;Decreased stride length   Gait velocity interpretation: 1.31 - 2.62 ft/sec, indicative of limited community ambulator General Gait Details: Slow, steady gait independent without DME. DOE up to 2/4, SpO2 90% on RA  Stairs            Wheelchair Mobility    Modified Rankin (Stroke Patients Only)       Balance Overall balance assessment: Needs assistance   Sitting balance-Leahy Scale: Good        Standing balance-Leahy Scale: Good                               Pertinent Vitals/Pain Pain Assessment: No/denies pain    Home Living Family/patient expects to be discharged to:: Private residence Living Arrangements: Other relatives Available Help at Discharge: Family;Available 24 hours/day Type of Home: House Home Access: Ramped entrance     Home Layout: One level Home Equipment: Shower seat;Grab bars - tub/shower Additional Comments: Lives with son and daughter-in-law    Prior Function Level of Independence: Independent with assistive device(s)         Comments: Household ambulation independent without DME. Uses rollator for community distances. 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[place] (was in [place] when wall being built)     [employer]   Dominant Hand: Right    Extremity/Trunk Assessment   Upper Extremity Assessment Upper Extremity Assessment: Generalized weakness    Lower Extremity Assessment Lower Extremity Assessment: Overall WFL for tasks assessed       Communication   Communication: No difficulties  Cognition Arousal/Alertness: Awake/alert Behavior During Therapy: WFL for tasks assessed/performed Overall Cognitive Status: Within Functional Limits for tasks assessed                                        General Comments      Exercises Other Exercises Other Exercises:  Good technique with incentive spirometer   Assessment/Plan    PT Assessment Patent does not need any further PT services  PT Problem List         PT Treatment Interventions      PT Goals (Current goals can be found in the Care Plan section)  Acute Rehab PT Goals PT Goal Formulation: All assessment and education complete, DC therapy    Frequency     Barriers to discharge        Co-evaluation               AM-PAC PT "6 Clicks" Mobility  Outcome Measure Help needed turning from your back to your side while in a flat bed without using  bedrails?: None Help needed moving from lying on your back to sitting on the side of a flat bed without using bedrails?: None Help needed moving to and from a bed to a chair (including a wheelchair)?: None Help needed standing up from a chair using your arms (e.g., wheelchair or bedside chair)?: None Help needed to walk in hospital room?: None Help needed climbing 3-5 steps with a railing? : A Little 6 Click Score: 23    End of Session   Activity Tolerance: Patient tolerated treatment well Patient left: in bed;with call bell/phone within reach Nurse Communication: Mobility status PT Visit Diagnosis: Other abnormalities of gait and mobility (R26.89)    Time: [id number] PT Time Calculation (min) (ACUTE ONLY): 14 min   Charges:   PT Evaluation $PT Eval Moderate Complexity: [place], PT, DPT Acute Rehabilitation Services  Pager [phone] Office [place] 08/19/2019, 1:22 PM

## 2019-08-20 LAB — COMPREHENSIVE METABOLIC PANEL
ALT: 63 U/L — ABNORMAL HIGH (ref 0–44)
AST: 53 U/L — ABNORMAL HIGH (ref 15–41)
Albumin: 3.5 g/dL (ref 3.5–5.0)
Alkaline Phosphatase: 84 U/L (ref 38–126)
Anion gap: 12 (ref 5–15)
BUN: 23 mg/dL (ref 8–23)
CO2: 25 mmol/L (ref 22–32)
Calcium: 8.7 mg/dL — ABNORMAL LOW (ref 8.9–10.3)
Chloride: 98 mmol/L (ref 98–111)
Creatinine, Ser: 0.78 mg/dL (ref 0.61–1.24)
GFR calc Af Amer: >60 mL/min (ref >60)
GFR calc non Af Amer: >60 mL/min (ref >60)
Glucose, Bld: 170 mg/dL — ABNORMAL HIGH (ref 70–99)
Potassium: 4.8 mmol/L (ref 3.5–5.1)
Sodium: 135 mmol/L (ref 135–145)
Total Bilirubin: 0.3 mg/dL (ref 0.3–1.2)
Total Protein: 6.7 g/dL (ref 6.5–8.1)

## 2019-08-20 LAB — PHOSPHORUS: Phosphorus: 3.2 mg/dL (ref 2.5–4.6)

## 2019-08-20 LAB — GLUCOSE, CAPILLARY
Glucose-Capillary: 117 mg/dL — ABNORMAL HIGH (ref 70–99)
Glucose-Capillary: 152 mg/dL — ABNORMAL HIGH (ref 70–99)
Glucose-Capillary: 153 mg/dL — ABNORMAL HIGH (ref 70–99)
Glucose-Capillary: 171 mg/dL — ABNORMAL HIGH (ref 70–99)
Glucose-Capillary: 178 mg/dL — ABNORMAL HIGH (ref 70–99)
Glucose-Capillary: 191 mg/dL — ABNORMAL HIGH (ref 70–99)
Glucose-Capillary: 261 mg/dL — ABNORMAL HIGH (ref 70–99)

## 2019-08-20 LAB — CBC WITH DIFFERENTIAL/PLATELET
Abs Immature Granulocytes: 0.08 10*3/uL — ABNORMAL HIGH (ref 0.00–0.07)
Basophils Absolute: 0 10*3/uL (ref 0.0–0.1)
Basophils Relative: 0 %
Eosinophils Absolute: 0 10*3/uL (ref 0.0–0.5)
Eosinophils Relative: 0 %
HCT: 42.6 % (ref 39.0–52.0)
Hemoglobin: 14 g/dL (ref 13.0–17.0)
Immature Granulocytes: 1 %
Lymphocytes Relative: 8 %
Lymphs Abs: 0.8 10*3/uL (ref 0.7–4.0)
MCH: 31.8 pg (ref 26.0–34.0)
MCHC: 32.9 g/dL (ref 30.0–36.0)
MCV: 96.8 fL (ref 80.0–100.0)
Monocytes Absolute: 0.6 10*3/uL (ref 0.1–1.0)
Monocytes Relative: 6 %
Neutro Abs: 7.8 10*3/uL — ABNORMAL HIGH (ref 1.7–7.7)
Neutrophils Relative %: 85 %
Platelets: 261 10*3/uL (ref 150–400)
RBC: 4.4 MIL/uL (ref 4.22–5.81)
RDW: 13.5 % (ref 11.5–15.5)
WBC: 9.2 10*3/uL (ref 4.0–10.5)
nRBC: 0 % (ref 0.0–0.2)

## 2019-08-20 LAB — MAGNESIUM: Magnesium: 2.1 mg/dL (ref 1.7–2.4)

## 2019-08-20 LAB — C-REACTIVE PROTEIN: CRP: 1.1 mg/dL — ABNORMAL HIGH (ref <1.0)

## 2019-08-20 LAB — D-DIMER, QUANTITATIVE: D-Dimer, Quant: 0.37 ug/mL-FEU (ref 0.00–0.50)

## 2019-08-20 LAB — FERRITIN: Ferritin: 68 ng/mL (ref 24–336)

## 2019-08-20 MED ORDER — HYDRALAZINE HCL 10 MG PO TABS
10.0000 mg | ORAL_TABLET | Freq: Three times a day (TID) | ORAL | Status: DC
  Administered 2019-08-20 – 2019-08-21 (×3): 10 mg via ORAL
  Filled 2019-08-20 (×5): qty 1

## 2019-08-20 NOTE — Progress Notes (Signed)
SATURATION QUALIFICATIONS: (This note is used to comply with regulatory documentation for home oxygen)  Patient Saturations on Room Air at Rest = 97%  Patient Saturations on Room Air while Ambulating = 95%                                             no oxygen required  Please briefly explain why patient needs home oxygen:

## 2019-08-20 NOTE — Progress Notes (Addendum)
PROGRESS NOTE    [name]  [id number] DOB: 1936-03-21 DOA: 08/16/2019 PCP: Biagio Borg, MD   Brief Narrative:   84 y.o. WM PMHx , Anxiety, Depression,Essential Tremor, vertigo intermittent Hx Bladder cancer, Rheumatoid Arthritis, Bradycardia, CAD, essential HTN, HLD, chronic low back pain, Hx nephrolithiasis, CKD stage III, Hx peptic ulcer,  Comes in with worsening shortness of breath and body aches and chest pain that occurred over the last several days.  Patient has diagnosed with Covid on July 22, 2019 and has been doing well up until last several days.  He was found to be hypoxic in the follow-up clinic with O2 sats of 88%.  He denies any nausea vomiting or diarrhea.  Denies any abdominal pain.  Denies any lower extremity swelling or edema.  Patient found to have bilateral opacities referred for admission for acute hypoxic respiratory failure secondary to bilateral Covid pneumonia.   Subjective: 2/6 A/O x4, negative S OB.  States now I will take deep breaths without pain.   Assessment & Plan:   Principal Problem:   Pneumonia due to COVID-19 virus Active Problems:   Malignant neoplasm of bladder (HCC)   CKD (chronic kidney disease) stage 3, GFR 30-59 ml/min   Rheumatoid arthritis (HCC)   Depression   Syncope   Spinal stenosis, lumbar   Left lumbar radiculopathy   Vertigo   Bradycardia   CAD in native artery   Essential tremor   Anxiety   Memory loss   Acute respiratory failure due to COVID-19 [facility])   Covid pneumonia/acute respiratory failure with hypoxia COVID-19 Labs  Recent Labs    08/18/19 0433 08/19/19 0357 08/20/19 0236  DDIMER 0.29 0.52* 0.37  FERRITIN 65 53 68  CRP 0.6 0.5 1.1*    Lab Results  Component Value Date   SARSCOV2NAA POSITIVE (A) 07/22/2019   SARSCOV2NAA Detected (A) 07/21/2019   [place] Not Detected 06/03/2019   -Decadron 6 mg daily -Remdesivir per pharmacy protocol (end date 2/7) -Albuterol -Vitamins per Covid protocol  -Flutter valve -Incentive spirometry -Titrate O2 to maintain SPO2> 88% SATURATION QUALIFICATIONS: (This note is used to comply with regulatory documentation for home oxygen) Patient Saturations on Room Air at Rest = 97% Patient Saturations on Room Air while Ambulating = 95%                                             no oxygen required Please briefly explain why patient needs home oxygen -Does not criteria for home O2  Bradycardia -Currently NSR -ASA 81 mg daily -Amlodipine 10 mg daily -2/6 increase Hydralazine 10 mg TID -Losartan 100 mg  Syncope -Orthostatic vitals q shift -Negative syncopal events during this hospitalization -Orthostatic vitals WNL to date -Strict in and out +475 ml -Daily weight Filed Weights   08/17/19 0255  Weight: 89 kg  -PT/OT to evaluate; no follow-up  CAD in native artery -See bradycardia  CKD stage III -Normal kidney function.  CKD stage III ruled out. -Continue to monitor closely Recent Labs  Lab 08/16/19 2200 08/17/19 1025 08/18/19 0433 08/19/19 0357 08/20/19 0236  CREATININE 0.83 0.92 0.87 0.86 0.78   Hx bladder cancer -Stable  Memory loss -Must be very mild as patient able to answer all questions appropriately  Anxiety/depression -Celexa 10 mg daily -Clonazepam 0.5 mg daily  Essential tremors -Primidone 125 mg TID  Prediabetes -2/4 Hemoglobin A1c =  6.0.  Under ADA guidelines patient meets criteria for prediabetes -NovoLog 4 units qac -2/5 Levemir 5 units BID -Moderate SSI   Goals of care -2/4 PT/OT consult; no follow-up required   DVT prophylaxis: Lovenox Code Status:  Family Communication:  2/6 spoke with Lattie Haw (daughter) discussed plan and answered all questions  Disposition Plan:    Consultants:    Procedures/Significant Events:     I have personally reviewed and interpreted all radiology studies and my findings are as above.  VENTILATOR SETTINGS: Room air 2/6 SPO2 96%   Cultures    Antimicrobials: Anti-infectives (From admission, onward)   Start     Dose/Rate Stop   08/18/19 1000  remdesivir 100 mg in sodium chloride 0.9 % 100 mL IVPB     100 mg 200 mL/hr over 30 Minutes 08/22/19 0959   08/17/19 1000  remdesivir 100 mg in sodium chloride 0.9 % 100 mL IVPB  Status:  Discontinued     100 mg 200 mL/hr over 30 Minutes 08/16/19 2356   08/17/19 0000  remdesivir 200 mg in sodium chloride 0.9% 250 mL IVPB  Status:  Discontinued     200 mg 580 mL/hr over 30 Minutes 08/16/19 2356   08/16/19 2359  remdesivir 200 mg in sodium chloride 0.9% 250 mL IVPB     200 mg 580 mL/hr over 30 Minutes 08/17/19 0108   08/16/19 2300  cefTRIAXone (ROCEPHIN) 1 g in sodium chloride 0.9 % 100 mL IVPB     1 g 200 mL/hr over 30 Minutes 08/17/19 0012   08/16/19 2300  azithromycin (ZITHROMAX) 500 mg in sodium chloride 0.9 % 250 mL IVPB     500 mg 250 mL/hr over 60 Minutes 08/17/19 0108       Devices    LINES / TUBES:      Continuous Infusions: . sodium chloride 1,000 mL (08/16/19 2235)  . sodium chloride    . remdesivir 100 mg in NS 100 mL 100 mg (08/20/19 0918)     Objective: Vitals:   08/19/19 1501 08/19/19 1915 08/20/19 0335 08/20/19 0716  BP: 131/63 (!) 143/65 (!) 144/64 (!) 150/70  Pulse: 63 66 (!) 55 (!) 52  Resp: 18 20  18   Temp: 98.8 F (37.1 C) 97.9 F (36.6 C) 97.7 F (36.5 C) 98 F (36.7 C)  TempSrc: Oral Oral Oral Oral  SpO2: 93% 97%  96%  Weight:      Height:        Intake/Output Summary (Last 24 hours) at 08/20/2019 1117 Last data filed at 08/20/2019 0830 Gross per 24 hour  Intake 1100 ml  Output 700 ml  Net 400 ml   Filed Weights   08/17/19 0255  Weight: 89 kg   Physical Exam:  General: A/O x4, no acute respiratory distress Eyes: negative scleral hemorrhage, negative anisocoria, negative icterus ENT: Negative Runny nose, negative gingival bleeding, Neck:  Negative scars, masses, torticollis, lymphadenopathy, JVD Lungs: Clear to auscultation  bilaterally without wheezes or crackles Cardiovascular: Regular rate and rhythm without murmur gallop or rub normal S1 and S2 Abdomen: negative abdominal pain, nondistended, positive soft, bowel sounds, no rebound, no ascites, no appreciable mass Extremities: No significant cyanosis, clubbing, or edema bilateral lower extremities Skin: Negative rashes, lesions, ulcers Psychiatric:  Negative depression, negative anxiety, negative fatigue, negative mania  Central nervous system:  Cranial nerves II through XII intact, tongue/uvula midline, all extremities muscle strength 5/5, sensation intact throughout,negative dysarthria, negative expressive aphasia, negative receptive aphasia.     Data  Reviewed: Care during the described time interval was provided by me .  I have reviewed this patient's available data, including medical history, events of note, physical examination, and all test results as part of my evaluation.   CBC: Recent Labs  Lab 08/16/19 2200 08/17/19 1025 08/18/19 0433 08/19/19 0357 08/20/19 0236  WBC 8.3 4.1 8.5 8.6 9.2  NEUTROABS 5.8 3.4 7.4 7.5 7.8*  HGB 15.2 13.6 15.1 13.8 14.0  HCT 45.7 40.7 45.5 42.4 42.6  MCV 95.8 94.9 96.0 96.6 96.8  PLT 273 273 274 252 0000000   Basic Metabolic Panel: Recent Labs  Lab 08/16/19 2200 08/17/19 1025 08/18/19 0433 08/19/19 0357 08/20/19 0236  NA 138 136 136 137 135  K 3.7 3.9 4.2 4.4 4.8  CL 102 104 103 104 98  CO2 25 23 23 26 25   GLUCOSE 113* 228* 144* 162* 170*  BUN 10 9 15 19 23   CREATININE 0.83 0.92 0.87 0.86 0.78  CALCIUM 9.5 8.9 9.4 9.2 8.7*  MG  --  1.8 2.0 2.1 2.1  PHOS  --  2.2* 3.6 3.4 3.2   GFR: Estimated Creatinine Clearance: 74.4 mL/min (by C-G formula based on SCr of 0.78 mg/dL). Liver Function Tests: Recent Labs  Lab 08/16/19 2200 08/17/19 1025 08/18/19 0433 08/19/19 0357 08/20/19 0236  AST 35 28 26 28  53*  ALT 33 27 27 31  63*  ALKPHOS 88 81 84 77 84  BILITOT 0.7 0.2* 0.2* 0.3 0.3  PROT 7.7 6.4* 7.3  6.6 6.7  ALBUMIN 4.1 3.2* 3.8 3.4* 3.5   No results for input(s): LIPASE, AMYLASE in the last 168 hours. No results for input(s): AMMONIA in the last 168 hours. Coagulation Profile: No results for input(s): INR, PROTIME in the last 168 hours. Cardiac Enzymes: No results for input(s): CKTOTAL, CKMB, CKMBINDEX, TROPONINI in the last 168 hours. BNP (last 3 results) No results for input(s): PROBNP in the last 8760 hours. HbA1C: Recent Labs    08/18/19 0433  HGBA1C 6.0*   CBG: Recent Labs  Lab 08/19/19 1624 08/19/19 1912 08/20/19 0006 08/20/19 0333 08/20/19 0714  GLUCAP 191* 231* 152* 171* 117*   Lipid Profile: No results for input(s): CHOL, HDL, LDLCALC, TRIG, CHOLHDL, LDLDIRECT in the last 72 hours. Thyroid Function Tests: No results for input(s): TSH, T4TOTAL, FREET4, T3FREE, THYROIDAB in the last 72 hours. Anemia Panel: Recent Labs    08/19/19 0357 08/20/19 0236  FERRITIN 53 68   Urine analysis:    Component Value Date/Time   COLORURINE AMBER (A) 07/22/2019 1954   APPEARANCEUR CLEAR 07/22/2019 1954   LABSPEC 1.030 07/22/2019 1954   PHURINE 5.0 07/22/2019 1954   GLUCOSEU NEGATIVE 07/22/2019 1954   GLUCOSEU NEGATIVE 06/24/2019 [place] 07/22/2019 [place] NEGATIVE 07/22/2019 1954   KETONESUR 5 (A) 07/22/2019 1954   PROTEINUR 30 (A) 07/22/2019 1954   UROBILINOGEN 0.2 06/24/2019 1204   NITRITE NEGATIVE 07/22/2019 1954   LEUKOCYTESUR NEGATIVE 07/22/2019 1954   Sepsis Labs: @LABRCNTIP (procalcitonin:4,lacticidven:4)  ) Recent Results (from the past 240 hour(s))  Blood Culture (routine x 2)     Status: None (Preliminary result)   Collection Time: 08/16/19  9:39 PM   Specimen: BLOOD  Result Value Ref Range Status   Specimen Description   Final    BLOOD LEFT ANTECUBITAL Performed at [facility], [place] [address], [place], [place] [address]    Special Requests   Final    BOTTLES DRAWN AEROBIC AND ANAEROBIC Blood Culture  adequate  volume Performed at [facility], [place] [address] [place], [place] [address]    Culture   Final    NO GROWTH 4 DAYS Performed at [facility], [place] [address], [place], [place] [address]    Report Status PENDING  Incomplete         Radiology Studies: No results found.      Scheduled Meds: . amLODipine  10 mg Oral Daily  . vitamin C  500 mg Oral Daily  . aspirin EC  81 mg Oral Daily  . brimonidine  1 drop Both Eyes BID  . citalopram  10 mg Oral Daily  . clonazePAM  0.5 mg Oral Daily  . dexamethasone (DECADRON) injection  6 mg Intravenous Q12H  . enoxaparin (LOVENOX) injection  40 mg Subcutaneous Q24H  . gabapentin  300 mg Oral QHS  . hydrALAZINE  5 mg Oral TID  . insulin aspart  0-15 Units Subcutaneous Q4H  . insulin aspart  4 Units Subcutaneous TID WC  . insulin detemir  5 Units Subcutaneous BID  . losartan  100 mg Oral Daily  . primidone  125 mg Oral TID  . sodium chloride flush  3 mL Intravenous Q12H  . timolol  1 drop Both Eyes BID  . zinc sulfate  220 mg Oral Daily   Continuous Infusions: . sodium chloride 1,000 mL (08/16/19 2235)  . sodium chloride    . remdesivir 100 mg in NS 100 mL 100 mg (08/20/19 0918)     LOS: 4 days   The patient is critically ill with multiple organ systems failure and requires high complexity decision making for assessment and support, frequent evaluation and titration of therapies, application of advanced monitoring technologies and extensive interpretation of multiple databases. Critical Care Time devoted to patient care services described in this note  Time spent: 40 minutes     WOODS, Geraldo Docker, MD [facility] Pager [phone]  If 7PM-7AM, please contact night-coverage [email or web address] Password [email or web address] 08/20/2019, 11:17 AM

## 2019-08-20 NOTE — Progress Notes (Signed)
Uneventful shift. Patient remains alert and oriented x 4. No falls or injuries this shift. Patient is room air with no respiratory issues. Call bell within reach. Will continue to monitor.

## 2019-08-20 NOTE — Progress Notes (Signed)
Updated [place] and [place] on current condition, treatment plan, and expected discharge date.

## 2019-08-21 DIAGNOSIS — R42 Dizziness and giddiness: Secondary | ICD-10-CM

## 2019-08-21 LAB — COMPREHENSIVE METABOLIC PANEL
ALT: 69 U/L — ABNORMAL HIGH (ref 0–44)
AST: 47 U/L — ABNORMAL HIGH (ref 15–41)
Albumin: 3.4 g/dL — ABNORMAL LOW (ref 3.5–5.0)
Alkaline Phosphatase: 72 U/L (ref 38–126)
Anion gap: 8 (ref 5–15)
BUN: 20 mg/dL (ref 8–23)
CO2: 25 mmol/L (ref 22–32)
Calcium: 8.8 mg/dL — ABNORMAL LOW (ref 8.9–10.3)
Chloride: 102 mmol/L (ref 98–111)
Creatinine, Ser: 0.75 mg/dL (ref 0.61–1.24)
GFR calc Af Amer: >60 mL/min (ref >60)
GFR calc non Af Amer: >60 mL/min (ref >60)
Glucose, Bld: 137 mg/dL — ABNORMAL HIGH (ref 70–99)
Potassium: 4.2 mmol/L (ref 3.5–5.1)
Sodium: 135 mmol/L (ref 135–145)
Total Bilirubin: 0.1 mg/dL — ABNORMAL LOW (ref 0.3–1.2)
Total Protein: 6.4 g/dL — ABNORMAL LOW (ref 6.5–8.1)

## 2019-08-21 LAB — CBC WITH DIFFERENTIAL/PLATELET
Abs Immature Granulocytes: 0.11 10*3/uL — ABNORMAL HIGH (ref 0.00–0.07)
Basophils Absolute: 0 10*3/uL (ref 0.0–0.1)
Basophils Relative: 0 %
Eosinophils Absolute: 0 10*3/uL (ref 0.0–0.5)
Eosinophils Relative: 0 %
HCT: 43.9 % (ref 39.0–52.0)
Hemoglobin: 14.3 g/dL (ref 13.0–17.0)
Immature Granulocytes: 1 %
Lymphocytes Relative: 11 %
Lymphs Abs: 0.9 10*3/uL (ref 0.7–4.0)
MCH: 31.1 pg (ref 26.0–34.0)
MCHC: 32.6 g/dL (ref 30.0–36.0)
MCV: 95.4 fL (ref 80.0–100.0)
Monocytes Absolute: 0.6 10*3/uL (ref 0.1–1.0)
Monocytes Relative: 7 %
Neutro Abs: 6.3 10*3/uL (ref 1.7–7.7)
Neutrophils Relative %: 81 %
Platelets: 272 10*3/uL (ref 150–400)
RBC: 4.6 MIL/uL (ref 4.22–5.81)
RDW: 13.4 % (ref 11.5–15.5)
WBC: 7.8 10*3/uL (ref 4.0–10.5)
nRBC: 0.3 % — ABNORMAL HIGH (ref 0.0–0.2)

## 2019-08-21 LAB — GLUCOSE, CAPILLARY
Glucose-Capillary: 193 mg/dL — ABNORMAL HIGH (ref 70–99)
Glucose-Capillary: 156 mg/dL — ABNORMAL HIGH (ref 70–99)
Glucose-Capillary: 110 mg/dL — ABNORMAL HIGH (ref 70–99)

## 2019-08-21 LAB — FERRITIN: Ferritin: 49 ng/mL (ref 24–336)

## 2019-08-21 LAB — MAGNESIUM: Magnesium: 2.2 mg/dL (ref 1.7–2.4)

## 2019-08-21 LAB — CULTURE, BLOOD (ROUTINE X 2)
Culture: NO GROWTH
Special Requests: ADEQUATE

## 2019-08-21 LAB — C-REACTIVE PROTEIN: CRP: 0.6 mg/dL (ref <1.0)

## 2019-08-21 LAB — D-DIMER, QUANTITATIVE: D-Dimer, Quant: 0.37 ug/mL-FEU (ref 0.00–0.50)

## 2019-08-21 LAB — PHOSPHORUS: Phosphorus: 3.8 mg/dL (ref 2.5–4.6)

## 2019-08-21 MED ORDER — ASCORBIC ACID 500 MG PO TABS: 500.0000 mg | ORAL_TABLET | Freq: Every day | ORAL | 0 refills | Status: DC

## 2019-08-21 MED ORDER — INSULIN DETEMIR 100 UNIT/ML FLEXPEN: 5.0000 [IU] | PEN_INJECTOR | Freq: Two times a day (BID) | SUBCUTANEOUS | 0 refills | Status: DC

## 2019-08-21 MED ORDER — ZINC SULFATE 220 (50 ZN) MG PO CAPS: 220.0000 mg | ORAL_CAPSULE | Freq: Every day | ORAL | 0 refills | Status: DC

## 2019-08-21 MED ORDER — HYDRALAZINE HCL 10 MG PO TABS: 10.0000 mg | ORAL_TABLET | Freq: Three times a day (TID) | ORAL | 0 refills | Status: DC

## 2019-08-21 MED ORDER — BENZONATATE 100 MG PO CAPS: 100.0000 mg | ORAL_CAPSULE | Freq: Three times a day (TID) | ORAL | Status: DC | PRN

## 2019-08-21 MED ORDER — INSULIN ASPART 100 UNIT/ML FLEXPEN: 4.0000 [IU] | PEN_INJECTOR | Freq: Three times a day (TID) | SUBCUTANEOUS | 0 refills | Status: DC

## 2019-08-21 MED ORDER — BENZONATATE 100 MG PO CAPS: 100.0000 mg | ORAL_CAPSULE | Freq: Three times a day (TID) | ORAL | 0 refills | Status: DC | PRN

## 2019-08-21 MED ORDER — INSULIN PEN NEEDLE 29G X 10MM MISC: 0 refills | Status: DC

## 2019-08-21 NOTE — Progress Notes (Signed)
Discharged to home in care of self and son. Patient is alert and oriented x 4, ambulatory with a steady gait. No abnormal behaviors seen, Denies pain or injury. IV removed without incident. AVS explained and given. Patient states understanding and will comply. Taken to front lobby via wheelchair per hospital policy.

## 2019-08-21 NOTE — Discharge Summary (Signed)
Physician Discharge Summary  [name] [id number] DOB: [birth year] DOA: 08/16/2019  PCP: Biagio Borg, MD  Admit date: 08/16/2019 Discharge date: 08/21/2019  Time spent: 30 minutes  Recommendations for Outpatient Follow-up:  Covid pneumonia/acute respiratory failure with hypoxia COVID-19 Labs  Recent Labs    08/19/19 0357 08/20/19 0236 08/21/19 0530  DDIMER 0.52* 0.37 0.37  FERRITIN 53 68 49  CRP 0.5 1.1* 0.6    Lab Results  Component Value Date   SARSCOV2NAA POSITIVE (A) 07/22/2019   SARSCOV2NAA Detected (A) 07/21/2019   [place] Not Detected 06/03/2019   -Decadron 6 mg daily -Remdesivir per pharmacy protocol (end date 2/7) -Albuterol -Vitamins per Covid protocol -Flutter valve -Incentive spirometry -Titrate O2 to maintain SPO2> 88% SATURATION QUALIFICATIONS: (Thisnote is usedto comply with regulatory documentation for home oxygen) Patient Saturations on Room Air at Rest =97% Patient Saturations on [employer] while Ambulating =95% no oxygen required Please briefly explain why patient needs home oxygen -Does not criteria for home O2 -Patient not considered contagious. Wound was positive for coronavirus on 1/8, has been 20 days since previous positive test. -Schedule follow-up with PCP upon discharge.  Bradycardia -Currently NSR -ASA 81 mg daily -Amlodipine 10 mg daily -2/6 increase Hydralazine 10 mg TID -Losartan 100 mg  Syncope -Orthostatic vitals q shift -Negative syncopal events during this hospitalization -Orthostatic vitals WNL to date -Strict in and out + 695 ml -Daily weight Filed Weights   08/17/19 0255 08/21/19 0500  Weight: 89 kg 89 kg   CAD in native artery -See bradycardia  CKD stage III -Normal kidney function.  CKD stage III ruled out. -Continue to monitor closely Recent Labs  Lab 08/17/19 1025 08/18/19 0433 08/19/19 0357 08/20/19 0236 08/21/19 0530  CREATININE 0.92 0.87 0.86  0.78 0.75   Hx bladder cancer -Stable  Memory loss -Must be very mild as patient able to answer all questions appropriately  Anxiety/depression -Celexa 10 mg daily -Clonazepam 0.5 mg daily  Essential tremors -Primidone 125 mg TID  Prediabetes -2/4 Hemoglobin A1c = 6.0.  Under ADA guidelines patient meets criteria for prediabetes -NovoLog 4 units qac -2/5 Levemir 5 units BID -Moderate SSI    Discharge Diagnoses:  Principal Problem:   Pneumonia due to COVID-19 virus Active Problems:   Malignant neoplasm of bladder (HCC)   CKD (chronic kidney disease) stage 3, GFR 30-59 ml/min   Rheumatoid arthritis (HCC)   Depression   Syncope   Spinal stenosis, lumbar   Left lumbar radiculopathy   Vertigo   Bradycardia   CAD in native artery   Essential tremor   Anxiety   Memory loss   Acute respiratory failure due to COVID-19 [facility])   Discharge Condition: Stable  Diet recommendation: Heart healthy/carb modified  Filed Weights   08/17/19 0255 08/21/19 0500  Weight: 89 kg 89 kg    History of present illness:  84 y.o.WM PMHx , Anxiety, Depression,Essential Tremor, vertigo intermittent Hx Bladder cancer, Rheumatoid Arthritis, Bradycardia, CAD, essential HTN, HLD, chronic low back pain, Hx nephrolithiasis, CKD stage III, Hx peptic ulcer,  Comes in with worsening shortness of breath and body aches and chest pain that occurred over the last several days. Patient has diagnosed with Covid on July 22, 2019 and has been doing well up until last several days. He was found to be hypoxic in the follow-up clinic with O2 sats of 88%. He denies any nausea vomiting or diarrhea. Denies any abdominal pain. Denies any lower extremity swelling or edema. Patient found to have bilateral  opacities referred for admission for acute hypoxic respiratory failure secondary to bilateral Covid pneumonia.  Hospital Course:  During hospitalization treated for Covid pneumonia/acute respiratory  failure with hypoxia. Patient responded well to Covid protocol, stable for discharge    VENTILATOR SETTINGS: Room air  2/7 SPO2  97%   Cultures   1/8 SARS coronavirus positive   Antibiotics Anti-infectives (From admission, onward)   Start     Dose/Rate Stop   08/18/19 1000  remdesivir 100 mg in sodium chloride 0.9 % 100 mL IVPB     100 mg 200 mL/hr over 30 Minutes 08/22/19 0959   08/17/19 1000  remdesivir 100 mg in sodium chloride 0.9 % 100 mL IVPB  Status:  Discontinued     100 mg 200 mL/hr over 30 Minutes 08/16/19 2356   08/17/19 0000  remdesivir 200 mg in sodium chloride 0.9% 250 mL IVPB  Status:  Discontinued     200 mg 580 mL/hr over 30 Minutes 08/16/19 2356   08/16/19 2359  remdesivir 200 mg in sodium chloride 0.9% 250 mL IVPB     200 mg 580 mL/hr over 30 Minutes 08/17/19 0108   08/16/19 2300  cefTRIAXone (ROCEPHIN) 1 g in sodium chloride 0.9 % 100 mL IVPB     1 g 200 mL/hr over 30 Minutes 08/17/19 0012   08/16/19 2300  azithromycin (ZITHROMAX) 500 mg in sodium chloride 0.9 % 250 mL IVPB     500 mg 250 mL/hr over 60 Minutes 08/17/19 0108       Discharge Exam: Vitals:   08/20/19 1944 08/21/19 0400 08/21/19 0500 08/21/19 0803  BP: (!) 149/66 134/69  (!) 143/80  Pulse: 64 (!) 51  (!) 52  Resp: 18   20  Temp: 98.2 F (36.8 C) 98.1 F (36.7 C)  97.9 F (36.6 C)  TempSrc: Oral Oral  Oral  SpO2: 96% 91%  97%  Weight:   89 kg   Height:        General: A/O x4, no acute respiratory distress Eyes: negative scleral hemorrhage, negative anisocoria, negative icterus ENT: Negative Runny nose, negative gingival bleeding, Neck:  Negative scars, masses, torticollis, lymphadenopathy, JVD Lungs: Clear to auscultation bilaterally without wheezes or crackles Cardiovascular: Regular rate and rhythm without murmur gallop or rub normal S1 and S2  Discharge Instructions   Allergies as of 08/21/2019      Reactions   Morphine And Related Other (See Comments)   Migraine     Hydrochlorothiazide Other (See Comments)   REACTION: hyponatremia with daily use   Tape Itching   Patient  Is ok to use paper tape   Sulfur Dioxide    Latex Rash   Lisinopril Cough   Sulfa Antibiotics Rash      Medication List    STOP taking these medications   cefdinir 300 MG capsule Commonly known as: OMNICEF   hydrochlorothiazide 25 MG tablet Commonly known as: HYDRODIURIL   potassium chloride 10 MEQ tablet Commonly known as: KLOR-CON   potassium chloride SA 20 MEQ tablet Commonly known as: KLOR-CON   predniSONE 20 MG tablet Commonly known as: DELTASONE     TAKE these medications   acetaminophen 650 MG CR tablet Commonly known as: TYLENOL Take 1,300 mg by mouth at bedtime as needed for pain.   albuterol 108 (90 Base) MCG/ACT inhaler Commonly known as: VENTOLIN HFA Inhale 2 puffs into the lungs every 6 (six) hours as needed for wheezing or shortness of breath.   amLODipine 10 MG tablet  Commonly known as: NORVASC TAKE 1 TABLET (10 MG TOTAL) DAILY. What changed: See the new instructions.   ascorbic acid 500 MG tablet Commonly known as: VITAMIN C Take 1 tablet (500 mg total) by mouth daily.   brimonidine 0.15 % ophthalmic solution Commonly known as: ALPHAGAN Place 1 drop into both eyes 2 (two) times daily.   budesonide-formoterol 160-4.5 MCG/ACT inhaler Commonly known as: SYMBICORT Inhale 2 puffs into the lungs 2 (two) times daily.   cetirizine 10 MG tablet Commonly known as: ZYRTEC Take 10 mg by mouth daily.   citalopram 10 MG tablet Commonly known as: CELEXA TAKE 1 TABLET EVERY MORNING What changed: when to take this   clonazePAM 0.5 MG tablet Commonly known as: KLONOPIN Take 0.5 mg by mouth daily as needed for anxiety.   clotrimazole 1 % cream Commonly known as: LOTRIMIN Apply 1 application topically 2 (two) times daily as needed (FOR RASH). Prescribed for rash in groin   dextromethorphan-guaiFENesin 30-600 MG 12hr tablet Commonly known as:  MUCINEX DM Take 1 tablet by mouth 2 (two) times daily.   fluticasone 50 MCG/ACT nasal spray Commonly known as: FLONASE USE 2 SPRAYS IN EACH NOSTRIL EVERY DAY What changed:   when to take this  reasons to take this   gabapentin 300 MG capsule Commonly known as: NEURONTIN TAKE 1 TO 2 CAPSULES FOUR TIMES DAILY  AFTER MEALS AND AT BEDTIME. What changed: See the new instructions.   hydrALAZINE 10 MG tablet Commonly known as: APRESOLINE Take 1 tablet (10 mg total) by mouth 3 (three) times daily. What changed: See the new instructions.   HYDROcodone-acetaminophen 5-325 MG tablet Commonly known as: NORCO/VICODIN Take 1 tablet by mouth every 6 (six) hours as needed for severe pain.   hydroxypropyl methylcellulose / hypromellose 2.5 % ophthalmic solution Commonly known as: ISOPTO TEARS / GONIOVISC Place 1 drop into both eyes 2 (two) times daily.   insulin aspart 100 UNIT/ML FlexPen Commonly known as: NOVOLOG Inject 4 Units into the skin 3 (three) times daily with meals.   Insulin Detemir 100 UNIT/ML Pen Commonly known as: LEVEMIR Inject 5 Units into the skin 2 (two) times daily.   Insulin Pen Needle 29G X 10MM Misc Use as directed   JUICE PLUS FIBRE PO Take 1 each 2 (two) times daily by mouth.   lidocaine 5 % Commonly known as: Lidoderm Place 1 patch onto the skin daily. Remove & Discard patch within 12 hours or as directed by MD What changed:   when to take this  reasons to take this   losartan 100 MG tablet Commonly known as: COZAAR TAKE 1 TABLET EVERY DAY   meclizine 25 MG tablet Commonly known as: ANTIVERT TAKE 1 TABLET TWICE DAILY AS NEEDED FOR DIZZINESS What changed: See the new instructions.   methocarbamol 500 MG tablet Commonly known as: ROBAXIN Take 500 mg by mouth 3 (three) times daily as needed for muscle spasms.   nitroGLYCERIN 0.4 MG SL tablet Commonly known as: NITROSTAT PLACE 1 TABLET UNDER THE TONGUE EVERY 5 MINUTES AS NEEDED FOR CHEST PAIN.  MAX 3 DOSES. CALL 911 IF NO IMPROVEMENT AFTER 1ST DOSE What changed: See the new instructions.   omeprazole 40 MG capsule Commonly known as: PRILOSEC TAKE 1 CAPSULE TWICE DAILY What changed:   how much to take  how to take this  when to take this  additional instructions   ondansetron 4 MG tablet Commonly known as: Zofran Take 1 tablet (4 mg total) by mouth every  8 (eight) hours as needed for nausea or vomiting.   primidone 250 MG tablet Commonly known as: MYSOLINE TAKE 1/2 TABLET THREE TIMES DAILY What changed: See the new instructions.   psyllium 58.6 % powder Commonly known as: Metamucil Smooth Texture Take 2 tablespoons daily. Drink 12 oz of water   simvastatin 40 MG tablet Commonly known as: ZOCOR TAKE 1 AND 1/2 TABLETS AT BEDTIME What changed: See the new instructions.   tamsulosin 0.4 MG Caps capsule Commonly known as: FLOMAX Take 1 capsule (0.4 mg total) by mouth daily. What changed:   how much to take  when to take this   timolol 0.5 % ophthalmic solution Commonly known as: TIMOPTIC Place 1 drop into both eyes 2 (two) times daily.   Travoprost (BAK Free) 0.004 % Soln ophthalmic solution Commonly known as: Travatan Z Place 1 drop into both eyes at bedtime.   triamcinolone 0.025 % cream Commonly known as: KENALOG Apply 1 application topically 2 (two) times daily as needed (rash).   Vitamin D (Ergocalciferol) 1.25 MG (50000 UNIT) Caps capsule Commonly known as: DRISDOL Take 1 capsule (50,000 Units total) by mouth every 7 (seven) days.   zinc sulfate 220 (50 Zn) MG capsule Take 1 capsule (220 mg total) by mouth daily.      Allergies  Allergen Reactions  . Morphine And Related Other (See Comments)    Migraine   . Hydrochlorothiazide Other (See Comments)    REACTION: hyponatremia with daily use  . Tape Itching    Patient  Is ok to use paper tape  . Sulfur Dioxide   . Latex Rash  . Lisinopril Cough  . Sulfa Antibiotics Rash      The  results of significant diagnostics from this hospitalization (including imaging, microbiology, ancillary and laboratory) are listed below for reference.    Significant Diagnostic Studies: DG Chest 2 View  Result Date: 07/22/2019 CLINICAL DATA:  Cough, shortness of breath and fever since 07/08/2019. EXAM: CHEST - 2 VIEW COMPARISON:  Single-view of the chest 02/15/2019. CT chest 02/24/2019. FINDINGS: Mild subsegmental atelectasis is seen in the left lung base. The lungs are otherwise clear. Emphysematous disease is noted but better seen on the prior CT. Heart size is normal. Atherosclerotic vascular disease identified. No acute or focal bony abnormality. Spinal stimulator is in place. IMPRESSION: No acute disease. Emphysema. Atherosclerosis. Electronically Signed   By: Inge Rise M.D.   On: 07/22/2019 12:46   CT Angio Chest PE W and/or Wo Contrast  Result Date: 08/17/2019 CLINICAL DATA:  PE suspected. Increasing shortness of breath. COVID-19 positive. EXAM: CT ANGIOGRAPHY CHEST WITH CONTRAST TECHNIQUE: Multidetector CT imaging of the chest was performed using the standard protocol during bolus administration of intravenous contrast. Multiplanar CT image reconstructions and MIPs were obtained to evaluate the vascular anatomy. CONTRAST:  136mL OMNIPAQUE IOHEXOL 350 MG/ML SOLN COMPARISON:  CT chest dated 02/24/2019 FINDINGS: Cardiovascular: Evaluation is limited by respiratory motion artifact.There is no pulmonary embolus. The main pulmonary artery is within normal limits for size. There is no CT evidence of acute right heart strain. There are atherosclerotic changes of the thoracic aorta without evidence for a thoracic aortic aneurysm. Heart size is the heart size is borderline enlarged. Coronary artery calcifications are noted. Mediastinum/Nodes: --No mediastinal or hilar lymphadenopathy. --No axillary lymphadenopathy. --No supraclavicular lymphadenopathy. --Normal thyroid gland. --The esophagus is  unremarkable Lungs/Pleura: There are scattered diffuse bilateral ground-glass airspace opacities. Emphysematous changes are noted. There are trace bilateral pleural effusions, right greater than left.  There is no pneumothorax. Upper Abdomen: No acute abnormality. Musculoskeletal: No chest wall abnormality. No acute or significant osseous findings. Review of the MIP images confirms the above findings. IMPRESSION: 1. Evaluation is limited by respiratory motion artifact. 2. No evidence for acute pulmonary embolus. 3. Diffuse bilateral ground-glass airspace opacities, which can be seen in patients with COVID-19 pneumonia. 4. Trace bilateral pleural effusions. 5. Borderline cardiomegaly. 6. Coronary artery calcifications. Aortic Atherosclerosis (ICD10-I70.0) and Emphysema (ICD10-J43.9). Electronically Signed   By: Constance Holster M.D.   On: 08/17/2019 00:16   DG Chest Port 1 View  Result Date: 08/16/2019 CLINICAL DATA:  Shortness of breath and generalized weakness. EXAM: PORTABLE CHEST 1 VIEW COMPARISON:  July 22, 2019 FINDINGS: Mild to moderate severity infiltrates are seen within the right lung base and along the periphery of the mid to upper right lung. Mild atelectasis is noted within the left lung base. Small bilateral pleural effusions are seen. There is no evidence of a pneumothorax. The heart size and mediastinal contours are within normal limits. There is stable spinal stimulator wire positioning. Degenerative changes seen throughout the thoracic spine. IMPRESSION: 1. Mild to moderate severity right-sided infiltrate with mild left basilar atelectasis. This represents a new finding when compared to the prior study dated July 22, 2019. Electronically Signed   By: Virgina Norfolk M.D.   On: 08/16/2019 22:16    Microbiology: Recent Results (from the past 240 hour(s))  Blood Culture (routine x 2)     Status: None   Collection Time: 08/16/19  9:39 PM   Specimen: BLOOD  Result Value Ref Range Status    Specimen Description   Final    BLOOD LEFT ANTECUBITAL Performed at [facility] [address], [place], [place] [address]    Special Requests   Final    BOTTLES DRAWN AEROBIC AND ANAEROBIC Blood Culture adequate volume Performed at [facility] [address], [place], [place] [address]    Culture   Final    NO GROWTH 5 DAYS Performed at [facility], [place] [address] [place], [place] [address]    Report Status 08/21/2019 FINAL  Final     Labs: Basic Metabolic Panel: Recent Labs  Lab 08/17/19 1025 08/18/19 0433 08/19/19 0357 08/20/19 0236 08/21/19 0530  NA 136 136 137 135 135  K 3.9 4.2 4.4 4.8 4.2  CL 104 103 104 98 102  CO2 23 23 26 25 25   GLUCOSE 228* 144* 162* 170* 137*  BUN 9 15 19 23 20   CREATININE 0.92 0.87 0.86 0.78 0.75  CALCIUM 8.9 9.4 9.2 8.7* 8.8*  MG 1.8 2.0 2.1 2.1 2.2  PHOS 2.2* 3.6 3.4 3.2 3.8   Liver Function Tests: Recent Labs  Lab 08/17/19 1025 08/18/19 0433 08/19/19 0357 08/20/19 0236 08/21/19 0530  AST 28 26 28  53* 47*  ALT 27 27 31  63* 69*  ALKPHOS 81 84 77 84 72  BILITOT 0.2* 0.2* 0.3 0.3 0.1*  PROT 6.4* 7.3 6.6 6.7 6.4*  ALBUMIN 3.2* 3.8 3.4* 3.5 3.4*   No results for input(s): LIPASE, AMYLASE in the last 168 hours. No results for input(s): AMMONIA in the last 168 hours. CBC: Recent Labs  Lab 08/17/19 1025 08/18/19 0433 08/19/19 0357 08/20/19 0236 08/21/19 0530  WBC 4.1 8.5 8.6 9.2 7.8  NEUTROABS 3.4 7.4 7.5 7.8* 6.3  HGB 13.6 15.1 13.8 14.0 14.3  HCT 40.7 45.5 42.4 42.6 43.9  MCV 94.9 96.0 96.6 96.8 95.4  PLT 273  274 252 261 272   Cardiac Enzymes: No results for input(s): CKTOTAL, CKMB, CKMBINDEX, TROPONINI in the last 168 hours. BNP: BNP (last 3 results) No results for input(s): BNP in the last 8760 hours.  ProBNP (last 3 results) No results for input(s): PROBNP in the last 8760 hours.  CBG: Recent Labs  Lab 08/20/19 1605 08/20/19 1914 08/21/19 0111  08/21/19 0436 08/21/19 0801  GLUCAP 153* 178* 193* 156* 110*       Signed:  Dia Crawford, MD [facility] [phone] pager

## 2019-08-21 NOTE — Discharge Instructions (Signed)
COVID-19 COVID-19 is a respiratory infection that is caused by a virus called severe acute respiratory syndrome coronavirus 2 (SARS-CoV-2). The disease is also known as coronavirus disease or novel coronavirus. In some people, the virus may not cause any symptoms. In others, it may cause a serious infection. The infection can get worse quickly and can lead to complications, such as:  Pneumonia, or infection of the lungs.  Acute respiratory distress syndrome or ARDS. This is a condition in which fluid build-up in the lungs prevents the lungs from filling with air and passing oxygen into the blood.  Acute respiratory failure. This is a condition in which there is not enough oxygen passing from the lungs to the body or when carbon dioxide is not passing from the lungs out of the body.  Sepsis or septic shock. This is a serious bodily reaction to an infection.  Blood clotting problems.  Secondary infections due to bacteria or fungus.  Organ failure. This is when your body's organs stop working. The virus that causes COVID-19 is contagious. This means that it can spread from person to person through droplets from coughs and sneezes (respiratory secretions). What are the causes? This illness is caused by a virus. You may catch the virus by:  Breathing in droplets from an infected person. Droplets can be spread by a person breathing, speaking, singing, coughing, or sneezing.  Touching something, like a table or a doorknob, that was exposed to the virus (contaminated) and then touching your mouth, nose, or eyes. What increases the risk? Risk for infection You are more likely to be infected with this virus if you:  Are within 6 feet (2 meters) of a person with COVID-19.  Provide care for or live with a person who is infected with COVID-19.  Spend time in crowded indoor spaces or live in shared housing. Risk for serious illness You are more likely to become seriously ill from the virus if you:   Are 50 years of age or older. The higher your age, the more you are at risk for serious illness.  Live in a nursing home or long-term care facility.  Have cancer.  Have a long-term (chronic) disease such as: ? Chronic lung disease, including chronic obstructive pulmonary disease or asthma. ? A long-term disease that lowers your body's ability to fight infection (immunocompromised). ? Heart disease, including heart failure, a condition in which the arteries that lead to the heart become narrow or blocked (coronary artery disease), a disease which makes the heart muscle thick, weak, or stiff (cardiomyopathy). ? Diabetes. ? Chronic kidney disease. ? Sickle cell disease, a condition in which red blood cells have an abnormal "sickle" shape. ? Liver disease.  Are obese. What are the signs or symptoms? Symptoms of this condition can range from mild to severe. Symptoms may appear any time from 2 to 14 days after being exposed to the virus. They include:  A fever or chills.  A cough.  Difficulty breathing.  Headaches, body aches, or muscle aches.  Runny or stuffy (congested) nose.  A sore throat.  New loss of taste or smell. Some people may also have stomach problems, such as nausea, vomiting, or diarrhea. Other people may not have any symptoms of COVID-19. How is this diagnosed? This condition may be diagnosed based on:  Your signs and symptoms, especially if: ? You live in an area with a COVID-19 outbreak. ? You recently traveled to or from an area where the virus is common. ? You   provide care for or live with a person who was diagnosed with COVID-19. ? You were exposed to a person who was diagnosed with COVID-19.  A physical exam.  Lab tests, which may include: ? Taking a sample of fluid from the back of your nose and throat (nasopharyngeal fluid), your nose, or your throat using a swab. ? A sample of mucus from your lungs (sputum). ? Blood tests.  Imaging tests, which  may include, X-rays, CT scan, or ultrasound. How is this treated? At present, there is no medicine to treat COVID-19. Medicines that treat other diseases are being used on a trial basis to see if they are effective against COVID-19. Your health care provider will talk with you about ways to treat your symptoms. For most people, the infection is mild and can be managed at home with rest, fluids, and over-the-counter medicines. Treatment for a serious infection usually takes places in a hospital intensive care unit (ICU). It may include one or more of the following treatments. These treatments are given until your symptoms improve.  Receiving fluids and medicines through an IV.  Supplemental oxygen. Extra oxygen is given through a tube in the nose, a face mask, or a hood.  Positioning you to lie on your stomach (prone position). This makes it easier for oxygen to get into the lungs.  Continuous positive airway pressure (CPAP) or bi-level positive airway pressure (BPAP) machine. This treatment uses mild air pressure to keep the airways open. A tube that is connected to a motor delivers oxygen to the body.  Ventilator. This treatment moves air into and out of the lungs by using a tube that is placed in your windpipe.  Tracheostomy. This is a procedure to create a hole in the neck so that a breathing tube can be inserted.  Extracorporeal membrane oxygenation (ECMO). This procedure gives the lungs a chance to recover by taking over the functions of the heart and lungs. It supplies oxygen to the body and removes carbon dioxide. Follow these instructions at home: Lifestyle  If you are sick, stay home except to get medical care. Your health care provider will tell you how long to stay home. Call your health care provider before you go for medical care.  Rest at home as told by your health care provider.  Do not use any products that contain nicotine or tobacco, such as cigarettes, e-cigarettes, and  chewing tobacco. If you need help quitting, ask your health care provider.  Return to your normal activities as told by your health care provider. Ask your health care provider what activities are safe for you. General instructions  Take over-the-counter and prescription medicines only as told by your health care provider.  Drink enough fluid to keep your urine pale yellow.  Keep all follow-up visits as told by your health care provider. This is important. How is this prevented?  There is no vaccine to help prevent COVID-19 infection. However, there are steps you can take to protect yourself and others from this virus. To protect yourself:   Do not travel to areas where COVID-19 is a risk. The areas where COVID-19 is reported change often. To identify high-risk areas and travel restrictions, check the CDC travel website: wwwnc.cdc.gov/travel/notices  If you live in, or must travel to, an area where COVID-19 is a risk, take precautions to avoid infection. ? Stay away from people who are sick. ? Wash your hands often with soap and water for 20 seconds. If soap and water   are not available, use an alcohol-based hand sanitizer. ? Avoid touching your mouth, face, eyes, or nose. ? Avoid going out in public, follow guidance from your state and local health authorities. ? If you must go out in public, wear a cloth face covering or face mask. Make sure your mask covers your nose and mouth. ? Avoid crowded indoor spaces. Stay at least 6 feet (2 meters) away from others. ? Disinfect objects and surfaces that are frequently touched every day. This may include:  Counters and tables.  Doorknobs and light switches.  Sinks and faucets.  Electronics, such as phones, remote controls, keyboards, computers, and tablets. To protect others: If you have symptoms of COVID-19, take steps to prevent the virus from spreading to others.  If you think you have a COVID-19 infection, contact your health care  provider right away. Tell your health care team that you think you may have a COVID-19 infection.  Stay home. Leave your house only to seek medical care. Do not use public transport.  Do not travel while you are sick.  Wash your hands often with soap and water for 20 seconds. If soap and water are not available, use alcohol-based hand sanitizer.  Stay away from other members of your household. Let healthy household members care for children and pets, if possible. If you have to care for children or pets, wash your hands often and wear a mask. If possible, stay in your own room, separate from others. Use a different bathroom.  Make sure that all people in your household wash their hands well and often.  Cough or sneeze into a tissue or your sleeve or elbow. Do not cough or sneeze into your hand or into the air.  Wear a cloth face covering or face mask. Make sure your mask covers your nose and mouth. Where to find more information  Centers for Disease Control and Prevention: www.cdc.gov/coronavirus/2019-ncov/index.html  World Health Organization: www.who.int/health-topics/coronavirus Contact a health care provider if:  You live in or have traveled to an area where COVID-19 is a risk and you have symptoms of the infection.  You have had contact with someone who has COVID-19 and you have symptoms of the infection. Get help right away if:  You have trouble breathing.  You have pain or pressure in your chest.  You have confusion.  You have bluish lips and fingernails.  You have difficulty waking from sleep.  You have symptoms that get worse. These symptoms may represent a serious problem that is an emergency. Do not wait to see if the symptoms will go away. Get medical help right away. Call your local emergency services (911 in the [place] Do not drive yourself to the hospital. Let the emergency medical personnel know if you think you have COVID-19. Summary  COVID-19 is a  respiratory infection that is caused by a virus. It is also known as coronavirus disease or novel coronavirus. It can cause serious infections, such as pneumonia, acute respiratory distress syndrome, acute respiratory failure, or sepsis.  The virus that causes COVID-19 is contagious. This means that it can spread from person to person through droplets from breathing, speaking, singing, coughing, or sneezing.  You are more likely to develop a serious illness if you are 50 years of age or older, have a weak immune system, live in a nursing home, or have chronic disease.  There is no medicine to treat COVID-19. Your health care provider will talk with you about ways to treat your symptoms.    Take steps to protect yourself and others from infection. Wash your hands often and disinfect objects and surfaces that are frequently touched every day. Stay away from people who are sick and wear a mask if you are sick. This information is not intended to replace advice given to you by your health care provider. Make sure you discuss any questions you have with your health care provider. Document Revised: 04/29/2019 Document Reviewed: 08/05/2018 Elsevier Patient Education  2020 [employer]  

## 2019-08-22 ENCOUNTER — Telehealth: Payer: Self-pay | Admitting: *Deleted

## 2019-08-22 NOTE — Telephone Encounter (Signed)
Transition Care Management Follow-up Telephone Call   Date discharged? 08/21/19   How have you been since you were released from the hospital? Pt states he seem to be doing alright   Do you understand why you were in the hospital? YES   Do you understand the discharge instructions? YES   Where were you discharged to? HOME   Items Reviewed:  Medications reviewed: YES, he states he was told to hold his potassium and HCTZ  Allergies reviewed: YES  Dietary changes reviewed: YES, heart healthy  Referrals reviewed: No referral recommeded   Functional Questionnaire:   Activities of Daily Living (ADLs):   He states he are independent in the following: ambulation, bathing and hygiene, feeding, continence, grooming, toileting and dressing States the doesn't require assistance    Any transportation issues/concerns?: NO   Any patient concerns? NO   Confirmed importance and date/time of follow-up visits scheduled YES, pt is not able to do an virtual. Inform pt MD will call him 08/30/19  Provider Appointment booked with Dr. Jenny Reichmann  Confirmed with patient if condition begins to worsen call PCP or go to the ER.  Patient was given the office number and encouraged to call back with question or concerns.  : YES

## 2019-08-30 ENCOUNTER — Encounter: Payer: Self-pay | Admitting: Internal Medicine

## 2019-08-30 ENCOUNTER — Ambulatory Visit (INDEPENDENT_AMBULATORY_CARE_PROVIDER_SITE_OTHER): Payer: Medicare Other | Admitting: Internal Medicine

## 2019-08-30 DIAGNOSIS — J1282 Pneumonia due to coronavirus disease 2019: Secondary | ICD-10-CM | POA: Diagnosis not present

## 2019-08-30 DIAGNOSIS — U071 COVID-19: Secondary | ICD-10-CM | POA: Diagnosis not present

## 2019-08-30 DIAGNOSIS — J9600 Acute respiratory failure, unspecified whether with hypoxia or hypercapnia: Secondary | ICD-10-CM

## 2019-08-30 NOTE — Patient Instructions (Signed)
Please continue all other medications as before, and refills have been done if requested.  Please have the pharmacy call with any other refills you may need.  Please continue your efforts at being more active, low cholesterol diet, and weight control.  Please keep your appointments with your specialists as you may have planned     

## 2019-08-30 NOTE — Progress Notes (Signed)
Patient ID: [name], male   DOB: [birth year], 84 y.o.   MRN: [id number]  Phone visit  Cumulative time during 7-day interval 12 min, there was not an associated office visit for this concern within a 7 day period.  Verbal consent for services obtained from patient prior to services given.  Names of all persons present for services: Cathlean Cower, MD, patient  Chief complaint: recent covid pna  History, background, results pertinent:  Here to f/u recent pna,  Pt denies fever, wt loss, night sweats, loss of appetite, or other constitutional symptoms  Pt denies chest pain, increased sob or doe, wheezing, orthopnea, PND, increased LE swelling, palpitations, dizziness or syncope. No new complaints  Past Medical History:  Diagnosis Date  . Acute bronchitis per pt no fever--  cough since discharge from hospital 07-02-2015   per pcp note 07-05-2015  mild to moderate bronchitis vs pna  . Allergic rhinitis   . Bladder cancer ([place])   . BPH (benign prostatic hyperplasia)   . Bradycardia   . CAD (coronary artery disease)   . Chronic low back pain   . DDD (degenerative disc disease), lumbosacral   . Depression   . Diverticulosis of colon (without mention of hemorrhage)   . Dyspnea   . Essential tremor   . Fatty liver disease, nonalcoholic   . GERD (gastroesophageal reflux disease)   . Glaucoma   . History of bladder cancer    2004--  TCC low-grade , non-invasive  . History of colon polyps   . History of exercise stress test    02-05-2007-- ETT (Clinically positive, electrically equivocal, submaximal ETT,  appropriate bp response to exercise  . History of hiatal hernia   . History of kidney stones   . History of peptic ulcer   . HLD (hyperlipidemia)   . HTN (hypertension)   . Nocturia   . Productive cough   . Rheumatoid arthritis(714.0)   . Right renal artery stenosis ([facility])    per cath 03-14-2009 --- 40-50%  . RLS (restless legs syndrome)   . Vertigo, intermittent   . Weak urinary  stream   . Wears dentures    upper  . Wears glasses   . Wears hearing aid    bilateral  . Wears partial dentures    lower   No results found for this or any previous visit (from the past 48 hour(s)).  A/P/next steps:   1) covid pna - resolved, no new needs identified,  to f/u any worsening symptoms or concerns  Cathlean Cower MD

## 2019-08-30 NOTE — Assessment & Plan Note (Signed)
Resolved see notes

## 2019-08-30 NOTE — Assessment & Plan Note (Signed)
See notes

## 2019-09-05 ENCOUNTER — Other Ambulatory Visit: Payer: Self-pay | Admitting: Internal Medicine

## 2019-09-13 ENCOUNTER — Ambulatory Visit: Payer: Medicare Other | Admitting: Neurology

## 2019-09-16 ENCOUNTER — Other Ambulatory Visit: Payer: Self-pay | Admitting: Internal Medicine

## 2019-09-19 ENCOUNTER — Ambulatory Visit (INDEPENDENT_AMBULATORY_CARE_PROVIDER_SITE_OTHER): Payer: Medicare Other

## 2019-09-19 ENCOUNTER — Ambulatory Visit (INDEPENDENT_AMBULATORY_CARE_PROVIDER_SITE_OTHER): Payer: Medicare Other | Admitting: Family Medicine

## 2019-09-19 VITALS — BP 158/72 | HR 68 | Temp 98.5°F | Wt 205.8 lb

## 2019-09-19 DIAGNOSIS — J449 Chronic obstructive pulmonary disease, unspecified: Secondary | ICD-10-CM

## 2019-09-19 DIAGNOSIS — R0602 Shortness of breath: Secondary | ICD-10-CM

## 2019-09-19 DIAGNOSIS — R05 Cough: Secondary | ICD-10-CM

## 2019-09-19 DIAGNOSIS — R059 Cough, unspecified: Secondary | ICD-10-CM

## 2019-09-19 DIAGNOSIS — R062 Wheezing: Secondary | ICD-10-CM

## 2019-09-19 DIAGNOSIS — J189 Pneumonia, unspecified organism: Secondary | ICD-10-CM | POA: Diagnosis not present

## 2019-09-19 DIAGNOSIS — R918 Other nonspecific abnormal finding of lung field: Secondary | ICD-10-CM | POA: Diagnosis not present

## 2019-09-19 MED ORDER — DOXYCYCLINE HYCLATE 100 MG PO CAPS: 100.0000 mg | ORAL_CAPSULE | Freq: Two times a day (BID) | ORAL | 0 refills | Status: DC

## 2019-09-19 MED ORDER — BUDESONIDE-FORMOTEROL FUMARATE 160-4.5 MCG/ACT IN AERO: 2.0000 | INHALATION_SPRAY | Freq: Two times a day (BID) | RESPIRATORY_TRACT | 3 refills | Status: DC

## 2019-09-19 MED ORDER — BENZONATATE 100 MG PO CAPS: 100.0000 mg | ORAL_CAPSULE | Freq: Three times a day (TID) | ORAL | 0 refills | Status: DC | PRN

## 2019-09-19 MED ORDER — BENZONATATE 100 MG PO CAPS: 100.0000 mg | ORAL_CAPSULE | Freq: Three times a day (TID) | ORAL | 2 refills | Status: DC | PRN

## 2019-09-19 NOTE — Progress Notes (Signed)
Patient ID: [name], male    DOB: July 15, 1935, 84 y.o.   MRN: [id number]  PCP: Biagio Borg, MD  Chief Complaint  Patient presents with  . Follow-up    cough, sob, wheezing after + covid 07/20/19    Subjective:  HPI  [name] is a 84 y.o. male presents to [facility] for follow-up of COVID-19 pneumonia.   High Risk for complications related to COVID-19 include: COPD, stage III kidney disease, bladder cancer, CHF with PM/ICD.  Patient previously seen at [facility]  08/16/19 for shortness of breath related to COVID-19 infection diagnosed back in early January. Patient was transported via EMS from our clinic to [facility] due to hypoxia and increased WOB. Patient subsequently was admitted with COVID related pneumonia and remained hospitalized for a period of 5 days. He developed steriodal induced hyperglycemia and had to use insulin for about 2 weeks at home while glucose normalized. Blood sugars have since normalized are now stable less than 120.    He was referred here to [facility] clinic for follow-up due to worsening shortness of breath and cough. Patient has also experienced wheezing which occurs in spite of use of inhalers (symbicort and albuterol). He endorses feeling much better than he did. He has received 1 of 2 of his COVID-19 vaccine, tolerated without issue. He denies fever, dizziness, nausea, or vomiting.   Review of Systems Pertinent negatives listed in HPI  Patient Active Problem List   Diagnosis Date Noted  . Pneumonia due to COVID-19 virus 08/16/2019  . Acute respiratory failure due to COVID-19 ([place]) 08/16/2019  . COVID-19 virus infection 08/09/2019  . Memory loss 06/24/2019  . Wheezing 06/24/2019  . Abrasion of left arm 04/11/2019  . Joint swelling of lower leg 11/29/2018  . Abnormal urinalysis 02/25/2018  . Anxiety 01/08/2018  . Laceration of hand 12/18/2017  . Pain of left hand 12/18/2017  . Laceration of scalp  without foreign body 06/09/2017  . Nonintractable headache 06/09/2017  . Acute sinus infection 04/14/2017  . Nausea and vomiting 03/27/2017  . Hyponatremia 03/17/2017  . Right lumbar radiculopathy 01/21/2017  . Fall 01/21/2017  . Synovial cyst 01/21/2017  . Chronic pain 09/11/2016  . Generalized weakness 05/22/2016  . UTI (urinary tract infection) 05/22/2016  . Weakness 05/22/2016  . Fever blister 10/18/2015  . Dyspnea 10/17/2015  . Bradycardia 09/17/2015  . Chest pain at rest 09/17/2015  . CAD in native artery 09/17/2015  . CHF (congestive heart failure) ([place]) 09/17/2015  . Pulmonary hypertension ([place]) 09/17/2015  . Bladder cancer ([place]) 09/17/2015  . Essential tremor 09/17/2015  . Chest pain 09/17/2015  . Anginal chest pain at rest [facility]) 09/17/2015  . Hypokalemia 07/13/2015  . Vertigo 07/01/2015  . Dizzinesses 07/01/2015  . Spinal stenosis of lumbar region at multiple levels 07/06/2014  . Angular cheilitis 05/05/2014  . Left lumbar radiculopathy 05/04/2014  . Right elbow pain 12/29/2013  . Lateral epicondylitis of right elbow 12/29/2013  . Cough 09/14/2013  . Spinal stenosis, lumbar 07/27/2013  . Palpable mass of neck 07/21/2013  . Vasovagal episode 01/10/2013  . Left otitis media 10/28/2012  . Impaired glucose tolerance 10/28/2012  . Cellulitis 04/24/2012  . Hemorrhoids 04/24/2012  . Syncope 04/21/2012  . Preventative health care 11/28/2010  . Depression 09/12/2010  . RENAL ARTERY STENOSIS 09/12/2010  . FATIGUE 09/12/2010  . BUNION, RIGHT FOOT 02/05/2010  . RECTAL BLEEDING 07/10/2009  . Diarrhea 07/10/2009  . DIVERTICULOSIS OF COLON 03/28/2009  .  FATTY LIVER DISEASE 03/28/2009  . BRADYCARDIA 03/20/2009  . DIZZINESS 10/17/2008  . CHEST PAIN 10/17/2008  . Malignant neoplasm of bladder ([place]) 02/07/2008  . Allergic rhinitis 02/07/2008  . COLONIC POLYPS, HX OF 02/07/2008  . Hyperlipidemia 11/24/2007  . GERD 11/24/2007  . HIATAL HERNIA WITH REFLUX 11/24/2007  .  CKD (chronic kidney disease) stage 3, GFR 30-59 ml/min 11/24/2007  . Rheumatoid arthritis ([place]) 11/24/2007  . [place] DISEASE, LUMBAR 11/24/2007  . ADENOIDECTOMY, HX OF 11/24/2007      Prior to Admission medications   Medication Sig Start Date End Date Taking? Authorizing Provider  acetaminophen (TYLENOL) 650 MG CR tablet Take 1,300 mg by mouth at bedtime as needed for pain.   Yes [provider]  albuterol (VENTOLIN HFA) 108 (90 Base) MCG/ACT inhaler Inhale 2 puffs into the lungs every 6 (six) hours as needed for wheezing or shortness of breath. 08/09/19  Yes Biagio Borg, MD  amLODipine (NORVASC) 10 MG tablet TAKE 1 TABLET (10 MG TOTAL) DAILY. 09/05/19  Yes Biagio Borg, MD  ascorbic acid (VITAMIN C) 500 MG tablet Take 1 tablet (500 mg total) by mouth daily. 08/21/19  Yes Allie Bossier, MD  brimonidine (ALPHAGAN) 0.15 % ophthalmic solution Place 1 drop into both eyes 2 (two) times daily.   Yes [provider]  cetirizine (ZYRTEC) 10 MG tablet Take 10 mg by mouth daily.   Yes [provider]  citalopram (CELEXA) 10 MG tablet TAKE 1 TABLET EVERY MORNING Patient taking differently: Take 10 mg by mouth daily.  01/31/19  Yes Biagio Borg, MD  clonazePAM (KLONOPIN) 0.5 MG tablet Take 0.5 mg by mouth daily as needed for anxiety.    Yes [provider]  clotrimazole (LOTRIMIN) 1 % cream Apply 1 application topically 2 (two) times daily as needed (FOR RASH). Prescribed for rash in groin   Yes [provider]  dextromethorphan-guaiFENesin (MUCINEX DM) 30-600 MG 12hr tablet Take 1 tablet by mouth 2 (two) times daily.   Yes [provider]  fluticasone (FLONASE) 50 MCG/ACT nasal spray USE 2 SPRAYS IN EACH NOSTRIL EVERY DAY Patient taking differently: Place 2 sprays into both nostrils daily as needed for allergies.  11/08/18  Yes Biagio Borg, MD  gabapentin (NEURONTIN) 300 MG capsule TAKE 1 TO 2 CAPSULES FOUR TIMES DAILY  AFTER MEALS AND AT  BEDTIME. Patient taking differently: Take 300 mg by mouth at bedtime.  06/01/19  Yes Biagio Borg, MD  hydrALAZINE (APRESOLINE) 10 MG tablet TAKE 1 TABLET THREE TIMES DAILY AS NEEDED FOR BLOOD PRESSURE MORE THAN 150/90 09/16/19  Yes Biagio Borg, MD  HYDROcodone-acetaminophen (NORCO/VICODIN) 5-325 MG tablet Take 1 tablet by mouth every 6 (six) hours as needed for severe pain. 02/15/19  Yes Plunkett, Loree Fee, MD  hydroxypropyl methylcellulose / hypromellose (ISOPTO TEARS / GONIOVISC) 2.5 % ophthalmic solution Place 1 drop into both eyes 2 (two) times daily.   Yes [provider]  insulin aspart (NOVOLOG) 100 UNIT/ML FlexPen Inject 4 Units into the skin 3 (three) times daily with meals. 08/21/19  Yes Allie Bossier, MD  Insulin Detemir (LEVEMIR) 100 UNIT/ML Pen Inject 5 Units into the skin 2 (two) times daily. 08/21/19  Yes Allie Bossier, MD  Insulin Pen Needle 29G X 10MM MISC Use as directed 08/21/19  Yes Allie Bossier, MD  lidocaine (LIDODERM) 5 % Place 1 patch onto the skin daily. Remove & Discard patch within 12 hours or as directed by MD Patient  taking differently: Place 1 patch onto the skin daily as needed (back pain). Remove & Discard patch within 12 hours or as directed by MD 02/15/19  Yes Blanchie Dessert, MD  losartan (COZAAR) 100 MG tablet TAKE 1 TABLET EVERY DAY Patient taking differently: Take 100 mg by mouth daily.  08/05/19  Yes Biagio Borg, MD  meclizine (ANTIVERT) 25 MG tablet TAKE 1 TABLET TWICE DAILY AS NEEDED FOR DIZZINESS Patient taking differently: Take 25 mg by mouth 2 (two) times daily as needed for dizziness.  08/05/19  Yes Biagio Borg, MD  methocarbamol (ROBAXIN) 500 MG tablet Take 500 mg by mouth 3 (three) times daily as needed for muscle spasms.   Yes [provider]  nitroGLYCERIN (NITROSTAT) 0.4 MG SL tablet PLACE 1 TABLET UNDER THE TONGUE EVERY 5 MINUTES AS NEEDED FOR CHEST PAIN. MAX 3 DOSES. CALL 911 IF NO IMPROVEMENT AFTER 1ST DOSE Patient taking  differently: Place 0.4 mg under the tongue every 5 (five) minutes as needed for chest pain.  02/01/18  Yes Biagio Borg, MD  Nutritional Supplements (JUICE PLUS FIBRE PO) Take 1 each 2 (two) times daily by mouth.   Yes [provider]  omeprazole (PRILOSEC) 40 MG capsule TAKE 1 CAPSULE TWICE DAILY Patient taking differently: Take 40 mg by mouth 2 (two) times daily.  08/08/19  Yes Biagio Borg, MD  ondansetron (ZOFRAN) 4 MG tablet Take 1 tablet (4 mg total) by mouth every 8 (eight) hours as needed for nausea or vomiting. 07/22/19  Yes Biagio Borg, MD  primidone (MYSOLINE) 250 MG tablet TAKE 1/2 TABLET THREE TIMES DAILY Patient taking differently: Take 125 mg by mouth 3 (three) times daily.  08/05/19  Yes Biagio Borg, MD  psyllium (METAMUCIL SMOOTH TEXTURE) 58.6 % powder Take 2 tablespoons daily. Drink 12 oz of water 02/25/18  Yes Irene Shipper, MD  simvastatin (ZOCOR) 40 MG tablet TAKE 1 AND 1/2 TABLETS AT BEDTIME Patient taking differently: Take 60 mg by mouth at bedtime.  04/29/19  Yes Biagio Borg, MD  tamsulosin (FLOMAX) 0.4 MG CAPS capsule Take 1 capsule (0.4 mg total) by mouth daily. Patient taking differently: Take 0.8 mg by mouth at bedtime.  05/17/15  Yes Biagio Borg, MD  timolol (TIMOPTIC) 0.5 % ophthalmic solution Place 1 drop into both eyes 2 (two) times daily.  05/30/15  Yes [provider]  Travoprost, BAK Free, (TRAVATAN Z) 0.004 % SOLN ophthalmic solution Place 1 drop into both eyes at bedtime. 09/14/13  Yes Biagio Borg, MD  triamcinolone (KENALOG) 0.025 % cream Apply 1 application topically 2 (two) times daily as needed (rash).    Yes [provider]  Vitamin D, Ergocalciferol, (DRISDOL) 1.25 MG (50000 UT) CAPS capsule Take 1 capsule (50,000 Units total) by mouth every 7 (seven) days. 06/27/19  Yes Biagio Borg, MD  zinc sulfate 220 (50 Zn) MG capsule Take 1 capsule (220 mg total) by mouth daily. 08/21/19  Yes Allie Bossier, MD  benzonatate (TESSALON)  100 MG capsule Take 1-2 capsules (100-200 mg total) by mouth 3 (three) times daily as needed for cough. 09/19/19   Scot Jun, FNP  budesonide-formoterol (SYMBICORT) 160-4.5 MCG/ACT inhaler Inhale 2 puffs into the lungs 2 (two) times daily. Patient not taking: Reported on 09/19/2019 08/16/19   Scot Jun, FNP  budesonide-formoterol [facility]) 160-4.5 MCG/ACT inhaler Inhale 2 puffs into the lungs 2 (two) times daily. 09/19/19   Scot Jun, FNP  doxycycline (  VIBRAMYCIN) 100 MG capsule Take 1 capsule (100 mg total) by mouth 2 (two) times daily. 09/19/19   Scot Jun, FNP    Past Medical, Surgical Family and Social History reviewed and updated.    Objective:   Today's Vitals   09/19/19 1834  BP: (!) 158/72  Pulse: 68  Temp: 98.5 F (36.9 C)  SpO2: 96%  Weight: 205 lb 12.8 oz (93.4 kg)    Wt Readings from Last 3 Encounters:  09/19/19 205 lb 12.8 oz (93.4 kg)  08/21/19 196 lb 3.4 oz (89 kg)  08/16/19 197 lb (89.4 kg)   Physical Exam Constitutional:      General: He is not in acute distress.    Appearance: He is not toxic-appearing or diaphoretic.     Comments: Chronically ill-appearing   Cardiovascular:     Rate and Rhythm: Normal rate and regular rhythm.  Pulmonary:     Breath sounds: Decreased air movement present. Examination of the right-lower field reveals rales. Examination of the left-lower field reveals rales. Rales present.  Musculoskeletal:     Cervical back: Normal range of motion.  Psychiatric:        Attention and Perception: Attention normal.        Mood and Affect: Mood normal.         Assessment & Plan:  1. Cough, complicated by PNA and COPD -Benzonatate 100-200 mg every 8 hours as needed for cough -Doxycyline prescribed for pneumonia which will hopefully improve cough.   2. SOB (shortness of breath)likely related to both persistent pneumonia and COPD -Chest x-ray confirmed persistent pneumonia -Covering with Doxycyline 100 mg  twice daily x 10 days. -Continue inhaler therapy    3. Wheezing, likely related to both persistent pneumonia and COPD -Continue inhaler therapy  4. Persistent Pneumonia,  -Completed prior prescribed antibiotics -Will trial covering with Doxycyline 100 mg twice daily x 10 days -Patient will return for evaluation at the POST-COVID care clinic in two weeks. Will likely need a repeat chest x-ray vs Chest CT   5. COPD, improved -Continue inhaled therapy -Refilled Symbicort (printed prescription to have filled at [facility]) -Opted against restarting prednisone as patient glucose was recently unstable due to steriodal use.  -Pt will return in 2 weeks for follow-up at POST-COVID care clinic     Meds ordered this encounter  Medications  . DISCONTD: benzonatate (TESSALON) 100 MG capsule    Sig: Take 1-2 capsules (100-200 mg total) by mouth 3 (three) times daily as needed for cough.    Dispense:  60 capsule    Refill:  0  . budesonide-formoterol (SYMBICORT) 160-4.5 MCG/ACT inhaler    Sig: Inhale 2 puffs into the lungs 2 (two) times daily.    Dispense:  1 Inhaler    Refill:  3  . DISCONTD: doxycycline (VIBRAMYCIN) 100 MG capsule    Sig: Take 1 capsule (100 mg total) by mouth 2 (two) times daily.    Dispense:  20 capsule    Refill:  0  . benzonatate (TESSALON) 100 MG capsule    Sig: Take 1-2 capsules (100-200 mg total) by mouth 3 (three) times daily as needed for cough.    Dispense:  60 capsule    Refill:  2  . doxycycline (VIBRAMYCIN) 100 MG capsule    Sig: Take 1 capsule (100 mg total) by mouth 2 (two) times daily.    Dispense:  20 capsule    Refill:  0      -The patient was given  clear instructions to go to ER or return to medical center if symptoms do not improve, worsen or new problems develop. The patient verbalized understanding.     Molli Barrows, FNP-C [facility], PRN Provider  [facility]. [place], [place] Clinic Phone: [phone] Clinic Fax:  [phone] Clinic Hours: 5:30 pm -7:30 pm (Monday-Friday)

## 2019-09-19 NOTE — Patient Instructions (Signed)
Start Doxycyline 100 mg twice daily for 10 days. I refilled your medication for your cough.  I printed your prescription for this Symbicort for you to have filled at the [place].  You will follow-up here after your clinic in 2 weeks for evaluation of your persistent.  You are okay to go ahead and get your second Covid vaccine schedule as long as you do not have a fever.  If your symptoms worsen prior to the follow-up appointment, please contact Dr Jenny Reichmann to have you scheduled here in our clinic.     Community-Acquired Pneumonia, Adult Pneumonia is an infection of the lungs. It causes swelling in the airways of the lungs. Mucus and fluid may also build up inside the airways. One type of pneumonia can happen while a person is in a hospital. A different type can happen when a person is not in a hospital (community-acquired pneumonia).  What are the causes?  This condition is caused by germs (viruses, bacteria, or fungi). Some types of germs can be passed from one person to another. This can happen when you breathe in droplets from the cough or sneeze of an infected person. What increases the risk? You are more likely to develop this condition if you:  Have a long-term (chronic) disease, such as: ? Chronic obstructive pulmonary disease (COPD). ? Asthma. ? Cystic fibrosis. ? Congestive heart failure. ? Diabetes. ? Kidney disease.  Have HIV.  Have sickle cell disease.  Have had your spleen removed.  Do not take good care of your teeth and mouth (poor dental hygiene).  Have a medical condition that increases the risk of breathing in droplets from your own mouth and nose.  Have a weakened body defense system (immune system).  Are a smoker.  Travel to areas where the germs that cause this illness are common.  Are around certain animals or the places they live. What are the signs or symptoms?  A dry cough.  A wet (productive) cough.  Fever.  Sweating.  Chest pain. This often  happens when breathing deeply or coughing.  Fast breathing or trouble breathing.  Shortness of breath.  Shaking chills.  Feeling tired (fatigue).  Muscle aches. How is this treated? Treatment for this condition depends on many things. Most adults can be treated at home. In some cases, treatment must happen in a hospital. Treatment may include:  Medicines given by mouth or through an IV tube.  Being given extra oxygen.  Respiratory therapy. In rare cases, treatment for very bad pneumonia may include:  Using a machine to help you breathe.  Having a procedure to remove fluid from around your lungs. Follow these instructions at home: Medicines  Take over-the-counter and prescription medicines only as told by your doctor. ? Only take cough medicine if you are losing sleep.  If you were prescribed an antibiotic medicine, take it as told by your doctor. Do not stop taking the antibiotic even if you start to feel better. General instructions   Sleep with your head and neck raised (elevated). You can do this by sleeping in a recliner or by putting a few pillows under your head.  Rest as needed. Get at least 8 hours of sleep each night.  Drink enough water to keep your pee (urine) pale yellow.  Eat a healthy diet that includes plenty of vegetables, fruits, whole grains, low-fat dairy products, and lean protein.  Do not use any products that contain nicotine or tobacco. These include cigarettes, e-cigarettes, and chewing tobacco.  If you need help quitting, ask your doctor.  Keep all follow-up visits as told by your doctor. This is important. How is this prevented? A shot (vaccine) can help prevent pneumonia. Shots are often suggested for:  People older than 82 years of age.  People older than 82 years of age who: ? Are having cancer treatment. ? Have long-term (chronic) lung disease. ? Have problems with their body's defense system. You may also prevent pneumonia if you  take these actions:  Get the flu (influenza) shot every year.  Go to the dentist as often as told.  Wash your hands often. If you cannot use soap and water, use hand sanitizer. Contact a doctor if:  You have a fever.  You lose sleep because your cough medicine does not help. Get help right away if:  You are short of breath and it gets worse.  You have more chest pain.  Your sickness gets worse. This is very serious if: ? You are an older adult. ? Your body's defense system is weak.  You cough up blood. Summary  Pneumonia is an infection of the lungs.  Most adults can be treated at home. Some will need treatment in a hospital.  Drink enough water to keep your pee pale yellow.  Get at least 8 hours of sleep each night. This information is not intended to replace advice given to you by your health care provider. Make sure you discuss any questions you have with your health care provider. Document Revised: 10/20/2018 Document Reviewed: 02/25/2018 Elsevier Patient Education  [place].

## 2019-09-21 LAB — COMPREHENSIVE METABOLIC PANEL
ALT: 27 IU/L (ref 0–44)
AST: 24 IU/L (ref 0–40)
Albumin/Globulin Ratio: 1.7 (ref 1.2–2.2)
Albumin: 4.3 g/dL (ref 3.6–4.6)
Alkaline Phosphatase: 103 IU/L (ref 39–117)
BUN/Creatinine Ratio: 21 (ref 10–24)
BUN: 16 mg/dL (ref 8–27)
Bilirubin Total: 0.2 mg/dL (ref 0.0–1.2)
CO2: 21 mmol/L (ref 20–29)
Calcium: 9.4 mg/dL (ref 8.6–10.2)
Chloride: 103 mmol/L (ref 96–106)
Creatinine, Ser: 0.76 mg/dL (ref 0.76–1.27)
GFR calc Af Amer: 98 mL/min/{1.73_m2} (ref >59)
GFR calc non Af Amer: 85 mL/min/{1.73_m2} (ref >59)
Globulin, Total: 2.5 g/dL (ref 1.5–4.5)
Glucose: 96 mg/dL (ref 65–99)
Potassium: 4.6 mmol/L (ref 3.5–5.2)
Sodium: 139 mmol/L (ref 134–144)
Total Protein: 6.8 g/dL (ref 6.0–8.5)

## 2019-09-21 LAB — CBC WITH DIFFERENTIAL/PLATELET
Basophils Absolute: 0.1 10*3/uL (ref 0.0–0.2)
Basos: 1 %
EOS (ABSOLUTE): 0.3 10*3/uL (ref 0.0–0.4)
Eos: 5 %
Hematocrit: 41 % (ref 37.5–51.0)
Hemoglobin: 13.5 g/dL (ref 13.0–17.7)
Immature Grans (Abs): 0 10*3/uL (ref 0.0–0.1)
Immature Granulocytes: 0 %
Lymphocytes Absolute: 1.4 10*3/uL (ref 0.7–3.1)
Lymphs: 24 %
MCH: 31.1 pg (ref 26.6–33.0)
MCHC: 32.9 g/dL (ref 31.5–35.7)
MCV: 95 fL (ref 79–97)
Monocytes Absolute: 0.7 10*3/uL (ref 0.1–0.9)
Monocytes: 12 %
Neutrophils Absolute: 3.3 10*3/uL (ref 1.4–7.0)
Neutrophils: 58 %
Platelets: 307 10*3/uL (ref 150–450)
RBC: 4.34 x10E6/uL (ref 4.14–5.80)
RDW: 13.5 % (ref 11.6–15.4)
WBC: 5.7 10*3/uL (ref 3.4–10.8)

## 2019-09-22 ENCOUNTER — Other Ambulatory Visit: Payer: Self-pay | Admitting: Internal Medicine

## 2019-09-22 NOTE — Telephone Encounter (Signed)
Please refill as per office routine med refill policy (all routine meds refilled for 3 mo or monthly per pt preference up to one year from last visit, then month to month grace period for 3 mo, then further med refills will have to be denied)  

## 2019-09-23 ENCOUNTER — Other Ambulatory Visit: Payer: Self-pay

## 2019-09-23 ENCOUNTER — Encounter: Payer: Self-pay | Admitting: Internal Medicine

## 2019-09-23 ENCOUNTER — Ambulatory Visit (INDEPENDENT_AMBULATORY_CARE_PROVIDER_SITE_OTHER): Payer: Medicare Other | Admitting: Internal Medicine

## 2019-09-23 DIAGNOSIS — F419 Anxiety disorder, unspecified: Secondary | ICD-10-CM | POA: Diagnosis not present

## 2019-09-23 DIAGNOSIS — U071 COVID-19: Secondary | ICD-10-CM | POA: Diagnosis not present

## 2019-09-23 DIAGNOSIS — R7302 Impaired glucose tolerance (oral): Secondary | ICD-10-CM | POA: Diagnosis not present

## 2019-09-23 DIAGNOSIS — I509 Heart failure, unspecified: Secondary | ICD-10-CM | POA: Diagnosis not present

## 2019-09-23 MED ORDER — CLONAZEPAM 0.5 MG PO TABS: 0.5000 mg | ORAL_TABLET | Freq: Two times a day (BID) | ORAL | 1 refills | Status: DC | PRN

## 2019-09-23 NOTE — Patient Instructions (Addendum)
Please take all new medication as prescribed - the klonopin for nerves due to your illnesses  Please continue all other medications as before, including the antibiotic and Inhalers  Please have the pharmacy call with any other refills you may need.  Please continue your efforts at being more active, low cholesterol diet, and weight control.  Please keep your appointments with your specialists as you may have planned  Please make an Appointment to return in 4 months, or sooner if needed

## 2019-09-23 NOTE — Assessment & Plan Note (Signed)
stable overall by history and exam, recent data reviewed with pt, and pt to continue medical treatment as before,  to f/u any worsening symptoms or concerns  

## 2019-09-23 NOTE — Progress Notes (Signed)
Subjective:    Patient ID: [name], male    DOB: [birth year], 84 y.o.   MRN: [id number]  HPI  Here to f/u; overall doing ok,  Pt denies chest pain, increasing sob or doe, wheezing, orthopnea, PND, increased LE swelling, palpitations, dizziness or syncope.  Pt denies new neurological symptoms such as new headache, or facial or extremity weakness or numbness.  Pt denies polydipsia, polyuria, or low sugar episode.  Pt states overall good compliance with meds, CBGs doing well overall, only takes his insulin bid prn sliding scale > 200.  Pt hesitates to take prednisone due to currently lower sugars.  Mar 8 CXR with improved infiltrates bilat, now on doxy 100 bid for 10 days. Asks for albuterol refill. Denies worsening depressive symptoms, suicidal ideation, or panic; has ongoing anxiety, increased recently with multiple chronic illness. Last klonopin was 2019 Past Medical History:  Diagnosis Date  . Acute bronchitis per pt no fever--  cough since discharge from hospital 07-02-2015   per pcp note 07-05-2015  mild to moderate bronchitis vs pna  . Allergic rhinitis   . Bladder cancer ([place])   . BPH (benign prostatic hyperplasia)   . Bradycardia   . CAD (coronary artery disease)   . Chronic low back pain   . DDD (degenerative disc disease), lumbosacral   . Depression   . Diverticulosis of colon (without mention of hemorrhage)   . Dyspnea   . Essential tremor   . Fatty liver disease, nonalcoholic   . GERD (gastroesophageal reflux disease)   . Glaucoma   . History of bladder cancer    2004--  TCC low-grade , non-invasive  . History of colon polyps   . History of exercise stress test    02-05-2007-- ETT (Clinically positive, electrically equivocal, submaximal ETT,  appropriate bp response to exercise  . History of hiatal hernia   . History of kidney stones   . History of peptic ulcer   . HLD (hyperlipidemia)   . HTN (hypertension)   . Nocturia   . Productive cough   . Rheumatoid  arthritis(714.0)   . Right renal artery stenosis (H[facility])    per cath 03-14-2009 --- 40-50%  . RLS (restless legs syndrome)   . Vertigo, intermittent   . Weak urinary stream   . Wears dentures    upper  . Wears glasses   . Wears hearing aid    bilateral  . Wears partial dentures    lower   Past Surgical History:  Procedure Laterality Date  . CARDIAC CATHETERIZATION  02-12-2007  dr gregg taylor   Non-obstructive CAD,  20% pLCX,  20% LAD, ef 65%  . CARDIAC CATHETERIZATION  03-14-2009  dr Johnsie Cancel   pLAD and mid diaginal 20-30% multiple lesions,  OM1 20%, right renal ostial 40-50%,  ef 60%  . CATARACT EXTRACTION W/ INTRAOCULAR LENS  IMPLANT, BILATERAL  2016  . CYSTOSCOPY WITH BIOPSY N/A 07/11/2015   Procedure: CYSTOSCOPY WITH COLD CUP RESECTION AND FULGERATION;  Surgeon: Rana Snare, MD;  Location: [facility];  Service: Urology;  Laterality: N/A;  . HIP ARTHROSCOPY W/ LABRAL DEBRIDEMENT Left 06-29-2000   and chondraplasty  . LUMBAR [place] SURGERY  07-16-2005  . LUMBAR LAMINECTOMY/DECOMPRESSION MICRODISCECTOMY  07/09/2011   Procedure: LUMBAR LAMINECTOMY/DECOMPRESSION MICRODISCECTOMY;  Surgeon: Johnn Hai;  Location: [facility];  Service: Orthopedics;  Laterality: Left;  Decompression L5 - S1 on the Left and repair of dura  . LUMBAR LAMINECTOMY/DECOMPRESSION MICRODISCECTOMY N/A 07/27/2013   Procedure:  DECOMPRESSION L4 - L5 WITH EXCISION OF SYNOVIAL CYST AND, LATERAL MASS FUSION 1 LEVEL;  Surgeon: Johnn Hai, MD;  Location: [facility];  Service: Orthopedics;  Laterality: N/A;  . LUMBAR LAMINECTOMY/DECOMPRESSION MICRODISCECTOMY Left 07/06/2014   Procedure:  MICRO LUMBAR DECOMPRESSION L5-S1 ON LEFT, L4-5 REDO;  Surgeon: Johnn Hai, MD;  Location: [facility];  Service: Orthopedics;  Laterality: Left;  . SPINAL CORD STIMULATOR INSERTION N/A 09/11/2016   Procedure: Spinal cord stimulator placement;  Surgeon: Melina Schools, MD;  Location: [place];  Service: Orthopedics;  Laterality: N/A;   . TONSILLECTOMY AND ADENOIDECTOMY    . TRANSTHORACIC ECHOCARDIOGRAM  09-20-2010   normal LVF, ef 55-65%/  mild MR, TR and PR/  mild LAE  . TRANSURETHRAL RESECTION OF BLADDER TUMOR  09-21-2002    reports that he quit smoking about 36 years ago. His smoking use included cigarettes. He has a 40.00 pack-year smoking history. He has never used smokeless tobacco. He reports current alcohol use. He reports that he does not use drugs. family history includes Cancer in his brother, father, and sister; Diabetes in his sister; Heart attack in his mother. Allergies  Allergen Reactions  . Morphine And Related Other (See Comments)    Migraine   . Hydrochlorothiazide Other (See Comments)    REACTION: hyponatremia with daily use  . Tape Itching    Patient  Is ok to use paper tape  . Sulfur Dioxide   . Latex Rash  . Lisinopril Cough  . Sulfa Antibiotics Rash   Current Outpatient Medications on File Prior to Visit  Medication Sig Dispense Refill  . acetaminophen (TYLENOL) 650 MG CR tablet Take 1,300 mg by mouth at bedtime as needed for pain.    Marland Kitchen albuterol (VENTOLIN HFA) 108 (90 Base) MCG/ACT inhaler Inhale 2 puffs into the lungs every 6 (six) hours as needed for wheezing or shortness of breath. 54 g 3  . amLODipine (NORVASC) 10 MG tablet TAKE 1 TABLET (10 MG TOTAL) DAILY. 90 tablet 1  . ascorbic acid (VITAMIN C) 500 MG tablet Take 1 tablet (500 mg total) by mouth daily. 30 tablet 0  . benzonatate (TESSALON) 100 MG capsule Take 1-2 capsules (100-200 mg total) by mouth 3 (three) times daily as needed for cough. 60 capsule 2  . brimonidine (ALPHAGAN) 0.15 % ophthalmic solution Place 1 drop into both eyes 2 (two) times daily.    . budesonide-formoterol (SYMBICORT) 160-4.5 MCG/ACT inhaler Inhale 2 puffs into the lungs 2 (two) times daily. 1 Inhaler 3  . cetirizine (ZYRTEC) 10 MG tablet Take 10 mg by mouth daily.    . citalopram (CELEXA) 10 MG tablet TAKE 1 TABLET EVERY MORNING (Patient taking differently:  Take 10 mg by mouth daily. ) 90 tablet 1  . clotrimazole (LOTRIMIN) 1 % cream Apply 1 application topically 2 (two) times daily as needed (FOR RASH). Prescribed for rash in groin    . dextromethorphan-guaiFENesin (MUCINEX DM) 30-600 MG 12hr tablet Take 1 tablet by mouth 2 (two) times daily.    Marland Kitchen doxycycline (VIBRAMYCIN) 100 MG capsule Take 1 capsule (100 mg total) by mouth 2 (two) times daily. 20 capsule 0  . fluticasone (FLONASE) 50 MCG/ACT nasal spray USE 2 SPRAYS IN EACH NOSTRIL EVERY DAY (Patient taking differently: Place 2 sprays into both nostrils daily as needed for allergies. ) 48 g 2  . gabapentin (NEURONTIN) 300 MG capsule TAKE 1 TO 2 CAPSULES FOUR TIMES DAILY  AFTER MEALS AND AT BEDTIME. (Patient taking differently: Take 300  mg by mouth at bedtime. ) 720 capsule 3  . hydrALAZINE (APRESOLINE) 10 MG tablet TAKE 1 TABLET THREE TIMES DAILY AS NEEDED FOR BLOOD PRESSURE MORE THAN 150/90 270 tablet 0  . HYDROcodone-acetaminophen (NORCO/VICODIN) 5-325 MG tablet Take 1 tablet by mouth every 6 (six) hours as needed for severe pain. 10 tablet 0  . hydroxypropyl methylcellulose / hypromellose (ISOPTO TEARS / GONIOVISC) 2.5 % ophthalmic solution Place 1 drop into both eyes 2 (two) times daily.    . insulin aspart (NOVOLOG) 100 UNIT/ML FlexPen Inject 4 Units into the skin 3 (three) times daily with meals. 6 mL 0  . Insulin Detemir (LEVEMIR) 100 UNIT/ML Pen Inject 5 Units into the skin 2 (two) times daily. 6 mL 0  . Insulin Pen Needle 29G X 10MM MISC Use as directed 200 each 0  . lidocaine (LIDODERM) 5 % Place 1 patch onto the skin daily. Remove & Discard patch within 12 hours or as directed by MD (Patient taking differently: Place 1 patch onto the skin daily as needed (back pain). Remove & Discard patch within 12 hours or as directed by MD) 30 patch 0  . losartan (COZAAR) 100 MG tablet TAKE 1 TABLET EVERY DAY (Patient taking differently: Take 100 mg by mouth daily. ) 90 tablet 2  . meclizine (ANTIVERT) 25  MG tablet TAKE 1 TABLET TWICE DAILY AS NEEDED FOR DIZZINESS (Patient taking differently: Take 25 mg by mouth 2 (two) times daily as needed for dizziness. ) 180 tablet 1  . methocarbamol (ROBAXIN) 500 MG tablet Take 500 mg by mouth 3 (three) times daily as needed for muscle spasms.    . nitroGLYCERIN (NITROSTAT) 0.4 MG SL tablet PLACE 1 TABLET UNDER THE TONGUE EVERY 5 MINUTES AS NEEDED FOR CHEST PAIN. MAX 3 DOSES. CALL 911 IF NO IMPROVEMENT AFTER 1ST DOSE (Patient taking differently: Place 0.4 mg under the tongue every 5 (five) minutes as needed for chest pain. ) 25 tablet 0  . Nutritional Supplements (JUICE PLUS FIBRE PO) Take 1 each 2 (two) times daily by mouth.    Marland Kitchen omeprazole (PRILOSEC) 40 MG capsule TAKE 1 CAPSULE TWICE DAILY (Patient taking differently: Take 40 mg by mouth 2 (two) times daily. ) 180 capsule 3  . ondansetron (ZOFRAN) 4 MG tablet Take 1 tablet (4 mg total) by mouth every 8 (eight) hours as needed for nausea or vomiting. 30 tablet 2  . primidone (MYSOLINE) 250 MG tablet TAKE 1/2 TABLET THREE TIMES DAILY (Patient taking differently: Take 125 mg by mouth 3 (three) times daily. ) 135 tablet 0  . psyllium (METAMUCIL SMOOTH TEXTURE) 58.6 % powder Take 2 tablespoons daily. Drink 12 oz of water 283 g 12  . simvastatin (ZOCOR) 40 MG tablet TAKE 1 AND 1/2 TABLETS AT BEDTIME 135 tablet 1  . tamsulosin (FLOMAX) 0.4 MG CAPS capsule Take 1 capsule (0.4 mg total) by mouth daily. (Patient taking differently: Take 0.8 mg by mouth at bedtime. ) 90 capsule 3  . timolol (TIMOPTIC) 0.5 % ophthalmic solution Place 1 drop into both eyes 2 (two) times daily.   1  . Travoprost, BAK Free, (TRAVATAN Z) 0.004 % SOLN ophthalmic solution Place 1 drop into both eyes at bedtime. 5 mL 5  . triamcinolone (KENALOG) 0.025 % cream Apply 1 application topically 2 (two) times daily as needed (rash).     . Vitamin D, Ergocalciferol, (DRISDOL) 1.25 MG (50000 UT) CAPS capsule Take 1 capsule (50,000 Units total) by mouth  every 7 (seven) days. 12  capsule 0  . zinc sulfate 220 (50 Zn) MG capsule Take 1 capsule (220 mg total) by mouth daily. 30 capsule 0   No current facility-administered medications on file prior to visit.   Review of Systems All otherwise neg per pt     Objective:   Physical Exam BP (!) 146/72   Pulse 70   Temp 98.1 F (36.7 C)   Ht 5\' 6"  (1.676 m)   Wt 208 lb (94.3 kg)   SpO2 98%   BMI 33.57 kg/m  VS noted,  Constitutional: Pt appears in NAD HENT: Head: NCAT.  Right Ear: External ear normal.  Left Ear: External ear normal.  Eyes: . Pupils are equal, round, and reactive to light. Conjunctivae and EOM are normal Nose: without d/c or deformity Neck: Neck supple. Gross normal ROM Cardiovascular: Normal rate and regular rhythm.   Pulmonary/Chest: Effort normal and breath sounds decresaed bilat with few RLL rales, and few exp wheezing only  Abd:  Soft, NT, ND, + BS, no organomegaly Neurological: Pt is alert. At baseline orientation, motor grossly intact Skin: Skin is warm. No rashes, other new lesions, no LE edema Psychiatric: Pt behavior is normal without agitation  All otherwise neg per pt Lab Results  Component Value Date   WBC 5.7 09/19/2019   HGB 13.5 09/19/2019   HCT 41.0 09/19/2019   PLT 307 09/19/2019   GLUCOSE 96 09/19/2019   CHOL 164 06/24/2019   TRIG 201 (H) 08/16/2019   HDL 48.80 06/24/2019   LDLDIRECT 86.0 06/24/2019   LDLCALC 65 10/14/2017   ALT 27 09/19/2019   AST 24 09/19/2019   NA 139 09/19/2019   K 4.6 09/19/2019   CL 103 09/19/2019   CREATININE 0.76 09/19/2019   BUN 16 09/19/2019   CO2 21 09/19/2019   TSH 3.78 06/24/2019   PSA 0.53 11/12/2016   INR 1.15 02/23/2018   HGBA1C 6.0 (H) 08/18/2019      Assessment & Plan:

## 2019-09-23 NOTE — Assessment & Plan Note (Addendum)
conts to be followed per post covid clinic with recent antibx and inhaler refill, now improved,  to f/u any worsening symptoms or concerns  I spent 32 minutes in preparing to see the patient by review of recent labs, imaging and procedures, obtaining and reviewing separately obtained history, communicating with the patient and family or caregiver, ordering medications, tests or procedures, and documenting clinical information in the EHR including the differential Dx, treatment, and any further evaluation and other management of recent covid pna, chf, hyperglycemia, anxiety

## 2019-09-23 NOTE — Assessment & Plan Note (Signed)
Ok for klonopin bid prn,  to f/u any worsening symptoms or concerns

## 2019-09-28 ENCOUNTER — Other Ambulatory Visit: Payer: Self-pay | Admitting: Internal Medicine

## 2019-09-30 ENCOUNTER — Other Ambulatory Visit: Payer: Self-pay | Admitting: Internal Medicine

## 2019-10-03 ENCOUNTER — Ambulatory Visit (INDEPENDENT_AMBULATORY_CARE_PROVIDER_SITE_OTHER): Payer: Medicare Other | Admitting: Nurse Practitioner

## 2019-10-03 ENCOUNTER — Other Ambulatory Visit: Payer: Self-pay | Admitting: Internal Medicine

## 2019-10-03 ENCOUNTER — Other Ambulatory Visit: Payer: Self-pay

## 2019-10-03 VITALS — BP 116/62 | HR 73 | Temp 96.2°F | Ht 66.0 in | Wt 210.8 lb

## 2019-10-03 DIAGNOSIS — J1282 Pneumonia due to coronavirus disease 2019: Secondary | ICD-10-CM

## 2019-10-03 DIAGNOSIS — J9601 Acute respiratory failure with hypoxia: Secondary | ICD-10-CM | POA: Insufficient documentation

## 2019-10-03 DIAGNOSIS — Z8616 Personal history of COVID-19: Secondary | ICD-10-CM | POA: Diagnosis not present

## 2019-10-03 NOTE — Patient Instructions (Addendum)
1. Cough, complicated by PNA and COPD Much improved   2. SOB (shortness of breath)likely related to both persistent pneumonia and COPD -Will order repeat chest x ray in 2 weeks - April 5th 2021 -Completed Doxycyline 3 days ago -Continue inhaler therapy  3. Wheezing, likely related to both persistent pneumonia and COPD -Continue inhaler therapy  4. Persistent Pneumonia,  -Will order repeat chest x ray in 2 weeks - April 5th 2021 -Completed Doxycyline 3 days ago -Continue inhaler therapy   5. COPD, improved -Continue inhaled therapy -continue Symbicort   Follow up chest xray in 2 weeks

## 2019-10-03 NOTE — Progress Notes (Addendum)
@Patient  ID: [name], male    DOB: 05/05/1936, 84 y.o.   MRN: [id number]  Chief Complaint  Patient presents with  . Post COVID    Tested postive 1/8. Took last antibotic for pneumonia on Friday.      Referring provider: Biagio Borg, MD   84 year old male with history of allergic rhinitis, bladder cancer, BPH, bradycardia, CAD, chronic low back pain, depression, essential tremor, GERD, HLD, HTN, RA, vertigo, and  Covid diagnosed 07/22/19.  Recent Significant Encounters:   08/16/19 - 08/21/19 Hospital admission [facility]: Treated for covid pneumonia/acute hypoxic respiratorywith Remdesivir, decadron, albuterol. Patient did not require O2 at discharge.  08/30/19 Hospital follow up with PCP televisit: Patient was improving - no new orders placed.  09/19/19 Respiratory clinic: worsening shortness of breath and cough/wheezing. Chest x ray showed persistent pneumonia. Prescribed tessalon perles and doxycycline. Continue symbicort and albuterol.   09/23/19 OV PCP: Doing well / improving. No new prescriptions  10/01/19 - 2nd covid vaccine completed    Imaging:   Chest xray 07/22/19: No acute disease  Chest xray 08/16/19: Mild to moderate severity right-sided infiltrate with mild left basilar atelectasis. This represents a new finding when compared to the prior study dated July 22, 2019.  CTA 08/17/19: Evaluation is limited by respiratory motion artifact. No evidence for acute pulmonary embolus. Diffuse bilateral ground-glass airspace opacities, which can be seen in patients with COVID-19 pneumonia. Trace bilateral pleural effusions. Borderline cardiomegaly. Coronary artery calcifications.  Chest xray 09/19/19: Persistent but significantly improved bilateral airspace opacities.   HPI  Patient presents today for a follow up with post covid care center. Patient states that he is much improved since last visit. He does still complain of wheezing and dry cough, but states this is improving. He states that  he feels the best he has felt in months. He does check his O2 sats at home and states that O2 sats are staying above 90% on RA. He has completed recent prescription of doxycycline and has had his second covid vaccine. He is compliant with medications. Denies f/c/s, n/v/d, hemoptysis, PND, edema or chest pain.   Note: patient walked in office today and sats remained above 91% for entire walk.        Allergies  Allergen Reactions  . Morphine And Related Other (See Comments)    Migraine   . Hydrochlorothiazide Other (See Comments)    REACTION: hyponatremia with daily use  . Tape Itching    Patient  Is ok to use paper tape  . Sulfur Dioxide   . Latex Rash  . Lisinopril Cough  . Sulfa Antibiotics Rash    Immunization History  Administered Date(s) Administered  . Influenza Split 03/14/2012  . Influenza Whole 05/17/2007, 04/19/2013  . Influenza, High Dose Seasonal PF 04/14/2017, 04/20/2018  . Influenza,inj,Quad PF,6+ Mos 04/05/2014, 06/04/2016  . Influenza,inj,quad, With Preservative 04/12/2018  . Influenza-Unspecified 05/14/2015  . Pneumococcal Conjugate-13 05/16/2014  . Pneumococcal Polysaccharide-23 05/17/2007  . Tdap 11/28/2010  . Zoster 10/25/2013    Past Medical History:  Diagnosis Date  . Acute bronchitis per pt no fever--  cough since discharge from hospital 07-02-2015   per pcp note 07-05-2015  mild to moderate bronchitis vs pna  . Allergic rhinitis   . Bladder cancer ([place])   . BPH (benign prostatic hyperplasia)   . Bradycardia   . CAD (coronary artery disease)   . Chronic low back pain   . DDD (degenerative disc disease), lumbosacral   .  Depression   . Diverticulosis of colon (without mention of hemorrhage)   . Dyspnea   . Essential tremor   . Fatty liver disease, nonalcoholic   . GERD (gastroesophageal reflux disease)   . Glaucoma   . History of bladder cancer    2004--  TCC low-grade , non-invasive  . History of colon polyps   . History of exercise  stress test    02-05-2007-- ETT (Clinically positive, electrically equivocal, submaximal ETT,  appropriate bp response to exercise  . History of hiatal hernia   . History of kidney stones   . History of peptic ulcer   . HLD (hyperlipidemia)   . HTN (hypertension)   . Nocturia   . Productive cough   . Rheumatoid arthritis(714.0)   . Right renal artery stenosis ([facility])    per cath 03-14-2009 --- 40-50%  . RLS (restless legs syndrome)   . Vertigo, intermittent   . Weak urinary stream   . Wears dentures    upper  . Wears glasses   . Wears hearing aid    bilateral  . Wears partial dentures    lower    Tobacco History: Social History   Tobacco Use  Smoking Status Former Smoker  . Packs/day: 2.00  . Years: 20.00  . Pack years: 40.00  . Types: Cigarettes  . Quit date: 10/08/1982  . Years since quitting: 37.0  Smokeless Tobacco Never Used   Counseling given: Not Answered   Outpatient Encounter Medications as of 10/03/2019  Medication Sig  . acetaminophen (TYLENOL) 650 MG CR tablet Take 1,300 mg by mouth at bedtime as needed for pain.  Marland Kitchen albuterol (VENTOLIN HFA) 108 (90 Base) MCG/ACT inhaler Inhale 2 puffs into the lungs every 6 (six) hours as needed for wheezing or shortness of breath.  Marland Kitchen amLODipine (NORVASC) 10 MG tablet TAKE 1 TABLET (10 MG TOTAL) DAILY.  Marland Kitchen ascorbic acid (VITAMIN C) 500 MG tablet Take 1 tablet (500 mg total) by mouth daily.  . benzonatate (TESSALON) 100 MG capsule Take 1-2 capsules (100-200 mg total) by mouth 3 (three) times daily as needed for cough.  . brimonidine (ALPHAGAN) 0.15 % ophthalmic solution Place 1 drop into both eyes 2 (two) times daily.  . budesonide-formoterol (SYMBICORT) 160-4.5 MCG/ACT inhaler Inhale 2 puffs into the lungs 2 (two) times daily.  . cetirizine (ZYRTEC) 10 MG tablet Take 10 mg by mouth daily.  . citalopram (CELEXA) 10 MG tablet TAKE 1 TABLET EVERY MORNING (Patient taking differently: Take 10 mg by mouth daily. )  . clonazePAM  (KLONOPIN) 0.5 MG tablet Take 1 tablet (0.5 mg total) by mouth 2 (two) times daily as needed for anxiety.  . clotrimazole (LOTRIMIN) 1 % cream Apply 1 application topically 2 (two) times daily as needed (FOR RASH). Prescribed for rash in groin  . dextromethorphan-guaiFENesin (MUCINEX DM) 30-600 MG 12hr tablet Take 1 tablet by mouth 2 (two) times daily.  Marland Kitchen doxycycline (VIBRAMYCIN) 100 MG capsule Take 1 capsule (100 mg total) by mouth 2 (two) times daily.  . fluticasone (FLONASE) 50 MCG/ACT nasal spray USE 2 SPRAYS IN EACH NOSTRIL EVERY DAY (Patient taking differently: Place 2 sprays into both nostrils daily as needed for allergies. )  . gabapentin (NEURONTIN) 300 MG capsule TAKE 1 TO 2 CAPSULES FOUR TIMES DAILY  AFTER MEALS AND AT BEDTIME. (Patient taking differently: Take 300 mg by mouth at bedtime. )  . hydrALAZINE (APRESOLINE) 10 MG tablet TAKE 1 TABLET THREE TIMES DAILY AS NEEDED FOR BLOOD PRESSURE MORE  THAN 150/90  . HYDROcodone-acetaminophen (NORCO/VICODIN) 5-325 MG tablet Take 1 tablet by mouth every 6 (six) hours as needed for severe pain.  . hydroxypropyl methylcellulose / hypromellose (ISOPTO TEARS / GONIOVISC) 2.5 % ophthalmic solution Place 1 drop into both eyes 2 (two) times daily.  . insulin aspart (NOVOLOG) 100 UNIT/ML FlexPen Inject 4 Units into the skin 3 (three) times daily with meals.  . Insulin Detemir (LEVEMIR) 100 UNIT/ML Pen Inject 5 Units into the skin 2 (two) times daily.  . Insulin Pen Needle 29G X 10MM MISC Use as directed  . lidocaine (LIDODERM) 5 % Place 1 patch onto the skin daily. Remove & Discard patch within 12 hours or as directed by MD (Patient taking differently: Place 1 patch onto the skin daily as needed (back pain). Remove & Discard patch within 12 hours or as directed by MD)  . losartan (COZAAR) 100 MG tablet TAKE 1 TABLET EVERY DAY (Patient taking differently: Take 100 mg by mouth daily. )  . meclizine (ANTIVERT) 25 MG tablet TAKE 1 TABLET TWICE DAILY AS NEEDED  FOR DIZZINESS (Patient taking differently: Take 25 mg by mouth 2 (two) times daily as needed for dizziness. )  . methocarbamol (ROBAXIN) 500 MG tablet Take 500 mg by mouth 3 (three) times daily as needed for muscle spasms.  . nitroGLYCERIN (NITROSTAT) 0.4 MG SL tablet PLACE 1 TABLET UNDER THE TONGUE EVERY 5 MINUTES AS NEEDED FOR CHEST PAIN. MAX 3 DOSES. CALL 911 IF NO IMPROVEMENT AFTER 1ST DOSE (Patient taking differently: Place 0.4 mg under the tongue every 5 (five) minutes as needed for chest pain. )  . Nutritional Supplements (JUICE PLUS FIBRE PO) Take 1 each 2 (two) times daily by mouth.  Marland Kitchen omeprazole (PRILOSEC) 40 MG capsule TAKE 1 CAPSULE TWICE DAILY (Patient taking differently: Take 40 mg by mouth 2 (two) times daily. )  . ondansetron (ZOFRAN) 4 MG tablet Take 1 tablet (4 mg total) by mouth every 8 (eight) hours as needed for nausea or vomiting.  . primidone (MYSOLINE) 250 MG tablet TAKE 1/2 TABLET THREE TIMES DAILY  . psyllium (METAMUCIL SMOOTH TEXTURE) 58.6 % powder Take 2 tablespoons daily. Drink 12 oz of water  . simvastatin (ZOCOR) 40 MG tablet TAKE 1 AND 1/2 TABLETS AT BEDTIME  . tamsulosin (FLOMAX) 0.4 MG CAPS capsule Take 1 capsule (0.4 mg total) by mouth daily. (Patient taking differently: Take 0.8 mg by mouth at bedtime. )  . timolol (TIMOPTIC) 0.5 % ophthalmic solution Place 1 drop into both eyes 2 (two) times daily.   . Travoprost, BAK Free, (TRAVATAN Z) 0.004 % SOLN ophthalmic solution Place 1 drop into both eyes at bedtime.  . triamcinolone (KENALOG) 0.025 % cream Apply 1 application topically 2 (two) times daily as needed (rash).   . Vitamin D, Ergocalciferol, (DRISDOL) 1.25 MG (50000 UT) CAPS capsule Take 1 capsule (50,000 Units total) by mouth every 7 (seven) days.  Marland Kitchen zinc sulfate 220 (50 Zn) MG capsule Take 1 capsule (220 mg total) by mouth daily.   No facility-administered encounter medications on file as of 10/03/2019.     Review of Systems  Review of Systems   Constitutional: Negative.  Negative for activity change, chills, fatigue and fever.  HENT: Negative.   Respiratory: Positive for cough, shortness of breath and wheezing.   Cardiovascular: Negative.  Negative for chest pain, palpitations and leg swelling.  Gastrointestinal: Negative.   Allergic/Immunologic: Negative.   Neurological: Negative.   Psychiatric/Behavioral: Negative.  Physical Exam  BP 116/62 (BP Location: Left Arm, Patient Position: Sitting)   Pulse 73   Temp (!) 96.2 F (35.7 C)   Ht 5\' 6"  (1.676 m)   Wt 210 lb 12 oz (95.6 kg)   SpO2 95%   BMI 34.02 kg/m   Wt Readings from Last 5 Encounters:  10/03/19 210 lb 12 oz (95.6 kg)  09/23/19 208 lb (94.3 kg)  09/19/19 205 lb 12.8 oz (93.4 kg)  08/21/19 196 lb 3.4 oz (89 kg)  08/16/19 197 lb (89.4 kg)     Physical Exam Vitals and nursing note reviewed.  Constitutional:      General: He is not in acute distress.    Appearance: He is well-developed. He is not ill-appearing.  Cardiovascular:     Rate and Rhythm: Normal rate and regular rhythm.  Pulmonary:     Effort: Pulmonary effort is normal.     Breath sounds: Wheezing present.  Skin:    General: Skin is warm and dry.  Neurological:     Mental Status: He is alert and oriented to person, place, and time.      Lab Results:  CBC    Component Value Date/Time   WBC 5.7 09/19/2019 1924   WBC 7.8 08/21/2019 0530   RBC 4.34 09/19/2019 1924   RBC 4.60 08/21/2019 0530   HGB 13.5 09/19/2019 1924   HCT 41.0 09/19/2019 1924   PLT 307 09/19/2019 1924   MCV 95 09/19/2019 1924   MCH 31.1 09/19/2019 1924   MCH 31.1 08/21/2019 0530   MCHC 32.9 09/19/2019 1924   MCHC 32.6 08/21/2019 0530   RDW 13.5 09/19/2019 1924   LYMPHSABS 1.4 09/19/2019 1924   MONOABS 0.6 08/21/2019 0530   EOSABS 0.3 09/19/2019 1924   BASOSABS 0.1 09/19/2019 1924    BMET    Component Value Date/Time   NA 139 09/19/2019 1924   K 4.6 09/19/2019 1924   CL 103 09/19/2019 1924    CO2 21 09/19/2019 1924   GLUCOSE 96 09/19/2019 1924   GLUCOSE 137 (H) 08/21/2019 0530   BUN 16 09/19/2019 1924   CREATININE 0.76 09/19/2019 1924   CALCIUM 9.4 09/19/2019 1924   GFRNONAA 85 09/19/2019 1924   GFRAA 98 09/19/2019 1924    BNP    Component Value Date/Time   BNP 88.4 09/17/2015 0445    ProBNP No results found for: PROBNP  Imaging: DG Chest Portable 2 Views  Result Date: 09/19/2019 CLINICAL DATA:  Cough and shortness of breath. EXAM: CHEST  2 VIEW PORTABLE COMPARISON:  08/16/2019 FINDINGS: The previously demonstrated bilateral airspace opacities have improved. There are residual bilateral airspace opacities with some underlying architectural distortion. The heart size is stable. Aortic calcifications are noted. There is no pneumothorax. There may be a trace right-sided pleural effusion. There is no acute osseous abnormality. IMPRESSION: Persistent but significantly improved bilateral airspace opacities. Electronically Signed   By: Constance Holster M.D.   On: 09/19/2019 19:15     Assessment & Plan:   History of COVID-19  1. Cough, complicated by PNA and COPD Much improved Continue tessalon perles as needed   2. SOB (shortness of breath)likely related to both persistent pneumonia and COPD -Will order repeat chest x ray in 2 weeks - April 5th 2021 -Completed Doxycyline 3 days ago -Continue inhaler therapy  3. Wheezing, likely related to both persistent pneumonia and COPD -Continue inhaler therapy  4. Persistent Pneumonia,  -Will order repeat chest x ray in 2 weeks - April  5th 2021 -Completed Doxycyline 3 days ago -Continue inhaler therapy   5. COPD, improved -Continue inhaled therapy -Continue Symbicort          Fenton Foy, NP 10/03/2019

## 2019-10-03 NOTE — Assessment & Plan Note (Signed)
  1. Cough, complicated by PNA and COPD Much improved Continue tessalon perles as needed   2. SOB (shortness of breath)likely related to both persistent pneumonia and COPD -Will order repeat chest x ray in 2 weeks - April 5th 2021 -Completed Doxycyline 3 days ago -Continue inhaler therapy  3. Wheezing, likely related to both persistent pneumonia and COPD -Continue inhaler therapy  4. Persistent Pneumonia,  -Will order repeat chest x ray in 2 weeks - April 5th 2021 -Completed Doxycyline 3 days ago -Continue inhaler therapy   5. COPD, improved -Continue inhaled therapy -Continue Symbicort

## 2019-10-07 ENCOUNTER — Encounter: Payer: Self-pay | Admitting: Podiatry

## 2019-10-07 ENCOUNTER — Other Ambulatory Visit: Payer: Self-pay

## 2019-10-07 ENCOUNTER — Ambulatory Visit (INDEPENDENT_AMBULATORY_CARE_PROVIDER_SITE_OTHER): Payer: Medicare Other | Admitting: Podiatry

## 2019-10-07 VITALS — Temp 95.6°F

## 2019-10-07 DIAGNOSIS — B351 Tinea unguium: Secondary | ICD-10-CM | POA: Diagnosis not present

## 2019-10-07 DIAGNOSIS — M79676 Pain in unspecified toe(s): Secondary | ICD-10-CM | POA: Diagnosis not present

## 2019-10-07 NOTE — Patient Instructions (Signed)

## 2019-10-10 DIAGNOSIS — H04123 Dry eye syndrome of bilateral lacrimal glands: Secondary | ICD-10-CM | POA: Diagnosis not present

## 2019-10-10 DIAGNOSIS — H02054 Trichiasis without entropian left upper eyelid: Secondary | ICD-10-CM | POA: Diagnosis not present

## 2019-10-10 DIAGNOSIS — H401133 Primary open-angle glaucoma, bilateral, severe stage: Secondary | ICD-10-CM | POA: Diagnosis not present

## 2019-10-10 DIAGNOSIS — H52203 Unspecified astigmatism, bilateral: Secondary | ICD-10-CM | POA: Diagnosis not present

## 2019-10-10 NOTE — Progress Notes (Signed)
Subjective: [name] presents today for follow up of painful mycotic nails b/l that are difficult to trim. Pain interferes with ambulation. Aggravating factors include wearing enclosed shoe gear. Pain is relieved with periodic professional debridement.   Allergies  Allergen Reactions  . Morphine And Related Other (See Comments)    Migraine   . Hydrochlorothiazide Other (See Comments)    REACTION: hyponatremia with daily use  . Tape Itching    Patient  Is ok to use paper tape  . Sulfur Dioxide   . Latex Rash  . Lisinopril Cough  . Sulfa Antibiotics Rash     Objective: Vitals:   10/07/19 1218  Temp: (!) 95.6 F (35.3 C)    Pt 84 y.o. year old Caucasian male  in NAD. AAO x 3.   Vascular Examination:  Capillary refill time to digits immediate b/l. Palpable DP pulses b/l. Palpable PT pulses b/l. Pedal hair sparse b/l. Skin temperature gradient within normal limits b/l.  Dermatological Examination: Pedal skin with normal turgor, texture and tone bilaterally. No open wounds bilaterally. No interdigital macerations bilaterally. Toenails 1-5 b/l elongated, dystrophic, thickened, crumbly with subungual debris and tenderness to dorsal palpation.  Musculoskeletal: Normal muscle strength 5/5 to all lower extremity muscle groups bilaterally, no gross bony deformities bilaterally and no pain crepitus or joint limitation noted with ROM b/l  Neurological: Protective sensation intact 5/5 intact bilaterally with 10g monofilament b/l  Assessment: 1. Pain due to onychomycosis of toenail    Plan: -Toenails 1-5 b/l were debrided in length and girth with sterile nail nippers and dremel without iatrogenic bleeding.  -Patient to continue soft, supportive shoe gear daily. -Patient to report any pedal injuries to medical professional immediately. -Patient/POA to call should there be question/concern in the interim.  Return in about 3 months (around 01/07/2020).

## 2019-10-11 DIAGNOSIS — M5416 Radiculopathy, lumbar region: Secondary | ICD-10-CM | POA: Diagnosis not present

## 2019-10-12 ENCOUNTER — Other Ambulatory Visit: Payer: Self-pay

## 2019-10-12 ENCOUNTER — Ambulatory Visit (HOSPITAL_COMMUNITY)
Admission: RE | Admit: 2019-10-12 | Discharge: 2019-10-12 | Disposition: A | Discharge status: Home / Self Care | Payer: Medicare Other | Source: Ambulatory Visit | Attending: Nurse Practitioner | Admitting: Nurse Practitioner

## 2019-10-12 DIAGNOSIS — Z8616 Personal history of COVID-19: Secondary | ICD-10-CM

## 2019-10-12 DIAGNOSIS — J1282 Pneumonia due to coronavirus disease 2019: Secondary | ICD-10-CM | POA: Insufficient documentation

## 2019-10-12 DIAGNOSIS — R198 Other specified symptoms and signs involving the digestive system and abdomen: Secondary | ICD-10-CM | POA: Diagnosis not present

## 2019-10-12 DIAGNOSIS — R0602 Shortness of breath: Secondary | ICD-10-CM | POA: Diagnosis not present

## 2019-10-12 DIAGNOSIS — U071 COVID-19: Secondary | ICD-10-CM | POA: Insufficient documentation

## 2019-10-13 ENCOUNTER — Other Ambulatory Visit: Payer: Self-pay | Admitting: Nurse Practitioner

## 2019-10-13 NOTE — Progress Notes (Signed)
Patient notified of results, verbally understood. Stated he is feeling better he is still having some coughing and wheezing requested a refill on Tessalon Perles appointment scheduled for 6/1.

## 2019-10-24 ENCOUNTER — Emergency Department (HOSPITAL_COMMUNITY): Payer: Medicare Other

## 2019-10-24 ENCOUNTER — Other Ambulatory Visit: Payer: Self-pay

## 2019-10-24 ENCOUNTER — Encounter (HOSPITAL_COMMUNITY): Payer: Self-pay | Admitting: Emergency Medicine

## 2019-10-24 ENCOUNTER — Emergency Department (HOSPITAL_COMMUNITY)
Admission: EM | Admit: 2019-10-24 | Discharge: 2019-10-24 | Disposition: A | Discharge status: Home / Self Care | Payer: Medicare Other | Attending: Emergency Medicine | Admitting: Emergency Medicine

## 2019-10-24 DIAGNOSIS — I509 Heart failure, unspecified: Secondary | ICD-10-CM | POA: Insufficient documentation

## 2019-10-24 DIAGNOSIS — I130 Hypertensive heart and chronic kidney disease with heart failure and stage 1 through stage 4 chronic kidney disease, or unspecified chronic kidney disease: Secondary | ICD-10-CM | POA: Diagnosis not present

## 2019-10-24 DIAGNOSIS — Z91040 Latex allergy status: Secondary | ICD-10-CM | POA: Insufficient documentation

## 2019-10-24 DIAGNOSIS — N183 Chronic kidney disease, stage 3 unspecified: Secondary | ICD-10-CM | POA: Insufficient documentation

## 2019-10-24 DIAGNOSIS — Z87891 Personal history of nicotine dependence: Secondary | ICD-10-CM | POA: Diagnosis not present

## 2019-10-24 DIAGNOSIS — R05 Cough: Secondary | ICD-10-CM | POA: Diagnosis not present

## 2019-10-24 DIAGNOSIS — Z8551 Personal history of malignant neoplasm of bladder: Secondary | ICD-10-CM | POA: Insufficient documentation

## 2019-10-24 DIAGNOSIS — Z794 Long term (current) use of insulin: Secondary | ICD-10-CM | POA: Insufficient documentation

## 2019-10-24 DIAGNOSIS — J441 Chronic obstructive pulmonary disease with (acute) exacerbation: Secondary | ICD-10-CM | POA: Diagnosis not present

## 2019-10-24 DIAGNOSIS — R0602 Shortness of breath: Secondary | ICD-10-CM | POA: Diagnosis not present

## 2019-10-24 DIAGNOSIS — R531 Weakness: Secondary | ICD-10-CM | POA: Insufficient documentation

## 2019-10-24 DIAGNOSIS — Z8616 Personal history of COVID-19: Secondary | ICD-10-CM | POA: Diagnosis not present

## 2019-10-24 DIAGNOSIS — Z79899 Other long term (current) drug therapy: Secondary | ICD-10-CM | POA: Insufficient documentation

## 2019-10-24 DIAGNOSIS — Z20822 Contact with and (suspected) exposure to covid-19: Secondary | ICD-10-CM | POA: Diagnosis not present

## 2019-10-24 DIAGNOSIS — I2510 Atherosclerotic heart disease of native coronary artery without angina pectoris: Secondary | ICD-10-CM | POA: Insufficient documentation

## 2019-10-24 DIAGNOSIS — R509 Fever, unspecified: Secondary | ICD-10-CM | POA: Diagnosis not present

## 2019-10-24 LAB — HEPATIC FUNCTION PANEL
ALT: 24 U/L (ref 0–44)
AST: 23 U/L (ref 15–41)
Albumin: 4 g/dL (ref 3.5–5.0)
Alkaline Phosphatase: 70 U/L (ref 38–126)
Bilirubin, Direct: 0.1 mg/dL (ref 0.0–0.2)
Indirect Bilirubin: 0.4 mg/dL (ref 0.3–0.9)
Total Bilirubin: 0.5 mg/dL (ref 0.3–1.2)
Total Protein: 7 g/dL (ref 6.5–8.1)

## 2019-10-24 LAB — CBC WITH DIFFERENTIAL/PLATELET
Abs Immature Granulocytes: 0.02 10*3/uL (ref 0.00–0.07)
Basophils Absolute: 0 10*3/uL (ref 0.0–0.1)
Basophils Relative: 0 %
Eosinophils Absolute: 0.3 10*3/uL (ref 0.0–0.5)
Eosinophils Relative: 3 %
HCT: 38.6 % — ABNORMAL LOW (ref 39.0–52.0)
Hemoglobin: 12.6 g/dL — ABNORMAL LOW (ref 13.0–17.0)
Immature Granulocytes: 0 %
Lymphocytes Relative: 14 %
Lymphs Abs: 1.3 10*3/uL (ref 0.7–4.0)
MCH: 29.7 pg (ref 26.0–34.0)
MCHC: 32.6 g/dL (ref 30.0–36.0)
MCV: 91 fL (ref 80.0–100.0)
Monocytes Absolute: 0.9 10*3/uL (ref 0.1–1.0)
Monocytes Relative: 10 %
Neutro Abs: 6.6 10*3/uL (ref 1.7–7.7)
Neutrophils Relative %: 73 %
Platelets: 244 10*3/uL (ref 150–400)
RBC: 4.24 MIL/uL (ref 4.22–5.81)
RDW: 13.6 % (ref 11.5–15.5)
WBC: 9.1 10*3/uL (ref 4.0–10.5)
nRBC: 0 % (ref 0.0–0.2)

## 2019-10-24 LAB — POC SARS CORONAVIRUS 2 AG -  ED: SARS Coronavirus 2 Ag: NEGATIVE

## 2019-10-24 LAB — BRAIN NATRIURETIC PEPTIDE: B Natriuretic Peptide: 92.6 pg/mL (ref 0.0–100.0)

## 2019-10-24 LAB — BASIC METABOLIC PANEL
Anion gap: 12 (ref 5–15)
BUN: 21 mg/dL (ref 8–23)
CO2: 23 mmol/L (ref 22–32)
Calcium: 8.7 mg/dL — ABNORMAL LOW (ref 8.9–10.3)
Chloride: 103 mmol/L (ref 98–111)
Creatinine, Ser: 0.85 mg/dL (ref 0.61–1.24)
GFR calc Af Amer: >60 mL/min (ref >60)
GFR calc non Af Amer: >60 mL/min (ref >60)
Glucose, Bld: 116 mg/dL — ABNORMAL HIGH (ref 70–99)
Potassium: 4 mmol/L (ref 3.5–5.1)
Sodium: 138 mmol/L (ref 135–145)

## 2019-10-24 MED ORDER — METHYLPREDNISOLONE 4 MG PO TBPK: ORAL_TABLET | ORAL | 0 refills | Status: DC

## 2019-10-24 MED ORDER — IPRATROPIUM-ALBUTEROL 0.5-2.5 (3) MG/3ML IN SOLN
3.0000 mL | Freq: Two times a day (BID) | RESPIRATORY_TRACT | Status: DC | PRN
  Administered 2019-10-24 (×2): 3 mL via RESPIRATORY_TRACT
  Filled 2019-10-24 (×2): qty 3

## 2019-10-24 MED ORDER — PREDNISONE 20 MG PO TABS
60.0000 mg | ORAL_TABLET | Freq: Once | ORAL | Status: AC
  Administered 2019-10-24: 14:00:00 60 mg via ORAL
  Filled 2019-10-24: qty 3

## 2019-10-24 MED ORDER — ALBUTEROL SULFATE HFA 108 (90 BASE) MCG/ACT IN AERS
6.0000 | INHALATION_SPRAY | Freq: Once | RESPIRATORY_TRACT | Status: AC
  Administered 2019-10-24: 12:00:00 6 via RESPIRATORY_TRACT
  Filled 2019-10-24: qty 6.7

## 2019-10-24 MED ORDER — IPRATROPIUM-ALBUTEROL 0.5-2.5 (3) MG/3ML IN SOLN: 3.0000 mL | Freq: Two times a day (BID) | RESPIRATORY_TRACT | Status: DC | PRN

## 2019-10-24 MED ORDER — IPRATROPIUM-ALBUTEROL 0.5-2.5 (3) MG/3ML IN SOLN
3.0000 mL | Freq: Once | RESPIRATORY_TRACT | Status: AC
  Administered 2019-10-24: 13:00:00 3 mL via RESPIRATORY_TRACT
  Filled 2019-10-24: qty 3

## 2019-10-24 NOTE — ED Triage Notes (Signed)
Pt had Covid in Jan. After got over Covid got PNA which was on antibiotics for long time. Was taken off couple weeks ago. Pt has continued to have SOB and dry cough. States his back hurts from coughing.

## 2019-10-24 NOTE — Discharge Instructions (Signed)
Patient clinical exam, I suspect you are having a COPD exacerbation.  You were wheezing a lot when you arrived.  We gave you several breathing treatments, and you reported that you felt better, and your lungs did sound better to me.  I prescribed you a steroid course which you will continue taking tomorrow.  Please follow-up with your primary care doctor in the office this week.  You should talk to your primary care provider about possibly setting you up with a nebulizer machine. In the meantime, at home, continue using your albuterol pump every 4-6 hours for the next 3-5 days.  It is possible you are experiencing some symptoms but week along Covid.  This may affect your breathing for several months.   If your oxygen numbers at home ever drops below 85% while at rest, she return to the emergency department.  Likewise if you have any significant difficulty breathing, you can was come back to the emergency department.

## 2019-10-24 NOTE — ED Notes (Signed)
RT en route to administer duoneb as ordered.

## 2019-10-24 NOTE — ED Provider Notes (Signed)
[facility] DEPT Provider Note   CSN: [id number] Arrival date & time: 10/24/19  [id number]     History Chief Complaint  Patient presents with  . Shortness of Breath    [name] is a 84 y.o. male with a history of COPD not on home oxygen, coronary artery disease, congestive heart failure, hypertension, hyperlipidemia, COVID-19 hospitalization with hypoxia in January 2021, presenting to emergency department with shortness of breath.  History provided by the patient and his son at bedside. Patient reports ever since his Covid hospitalization in Jan, he has been dealing with intermittent episodes of shortness of breath.  He has been treated twice as an outpatient for pneumonia with 2 full courses of antibiotics.  Says he is feeling bit better after his treatments.  However in the past week or so, he is progressively felt more more short of breath.  He says he is very persistent cough.  Is not bringing up any phlegm.  He denies any fevers or chills.  He feels generally weak.  He has 2 loose bowel movements daily which are nonbloody.    While hospitalized in January, he received Decadron & remdesivir.  He does have an albuterol inhaler which he uses for his COPD, and says he gets mild relief with that.  He does not use nebulizers at home.  He reports being told that he may have heart failure in the past.  He is not on a diuretic.  His last echo per our records with report was March 2017 with EF 55-60%, no regional wall motion abnormalities.  He reports intermittent ankle swelling over past few weeks.  He quit smoking in 1990's.  HPI     Past Medical History:  Diagnosis Date  . Acute bronchitis per pt no fever--  cough since discharge from hospital 07-02-2015   per pcp note 07-05-2015  mild to moderate bronchitis vs pna  . Allergic rhinitis   . Bladder cancer ([place])   . BPH (benign prostatic hyperplasia)   . Bradycardia   . CAD (coronary artery disease)   .  Chronic low back pain   . DDD (degenerative disc disease), lumbosacral   . Depression   . Diverticulosis of colon (without mention of hemorrhage)   . Dyspnea   . Essential tremor   . Fatty liver disease, nonalcoholic   . GERD (gastroesophageal reflux disease)   . Glaucoma   . History of bladder cancer    2004--  TCC low-grade , non-invasive  . History of colon polyps   . History of exercise stress test    02-05-2007-- ETT (Clinically positive, electrically equivocal, submaximal ETT,  appropriate bp response to exercise  . History of hiatal hernia   . History of kidney stones   . History of peptic ulcer   . HLD (hyperlipidemia)   . HTN (hypertension)   . Nocturia   . Productive cough   . Rheumatoid arthritis(714.0)   . Right renal artery stenosis (HCC)    per cath 03-14-2009 --- 40-50%  . RLS (restless legs syndrome)   . Vertigo, intermittent   . Weak urinary stream   . Wears dentures    upper  . Wears glasses   . Wears hearing aid    bilateral  . Wears partial dentures    lower    Patient Active Problem List   Diagnosis Date Noted  . History of COVID-19 10/03/2019  . Acute respiratory failure with hypoxia ([place]) 10/03/2019  . Pneumonia due  to COVID-19 virus 08/16/2019  . Acute respiratory failure due to COVID-19 ([place]) 08/16/2019  . COVID-19 virus infection 08/09/2019  . Memory loss 06/24/2019  . Wheezing 06/24/2019  . Abrasion of left arm 04/11/2019  . Joint swelling of lower leg 11/29/2018  . Abnormal urinalysis 02/25/2018  . Anxiety 01/08/2018  . Laceration of hand 12/18/2017  . Pain of left hand 12/18/2017  . Laceration of scalp without foreign body 06/09/2017  . Nonintractable headache 06/09/2017  . Acute sinus infection 04/14/2017  . Nausea and vomiting 03/27/2017  . Hyponatremia 03/17/2017  . Right lumbar radiculopathy 01/21/2017  . Fall 01/21/2017  . Synovial cyst 01/21/2017  . Chronic pain 09/11/2016  . Generalized weakness 05/22/2016  . UTI  (urinary tract infection) 05/22/2016  . Weakness 05/22/2016  . Fever blister 10/18/2015  . Dyspnea 10/17/2015  . Bradycardia 09/17/2015  . Chest pain at rest 09/17/2015  . CAD in native artery 09/17/2015  . CHF (congestive heart failure) ([place]) 09/17/2015  . Pulmonary hypertension ([place]) 09/17/2015  . Bladder cancer ([place]) 09/17/2015  . Essential tremor 09/17/2015  . Chest pain 09/17/2015  . Anginal chest pain at rest [facility]) 09/17/2015  . Hypokalemia 07/13/2015  . Vertigo 07/01/2015  . Dizzinesses 07/01/2015  . Spinal stenosis of lumbar region at multiple levels 07/06/2014  . Angular cheilitis 05/05/2014  . Left lumbar radiculopathy 05/04/2014  . Right elbow pain 12/29/2013  . Lateral epicondylitis of right elbow 12/29/2013  . Cough 09/14/2013  . Spinal stenosis, lumbar 07/27/2013  . Palpable mass of neck 07/21/2013  . Vasovagal episode 01/10/2013  . Left otitis media 10/28/2012  . Impaired glucose tolerance 10/28/2012  . Cellulitis 04/24/2012  . Hemorrhoids 04/24/2012  . Syncope 04/21/2012  . Preventative health care 11/28/2010  . Depression 09/12/2010  . RENAL ARTERY STENOSIS 09/12/2010  . FATIGUE 09/12/2010  . BUNION, RIGHT FOOT 02/05/2010  . RECTAL BLEEDING 07/10/2009  . Diarrhea 07/10/2009  . DIVERTICULOSIS OF COLON 03/28/2009  . FATTY LIVER DISEASE 03/28/2009  . BRADYCARDIA 03/20/2009  . DIZZINESS 10/17/2008  . CHEST PAIN 10/17/2008  . Malignant neoplasm of bladder ([place]) 02/07/2008  . Allergic rhinitis 02/07/2008  . COLONIC POLYPS, HX OF 02/07/2008  . Hyperlipidemia 11/24/2007  . GERD 11/24/2007  . HIATAL HERNIA WITH REFLUX 11/24/2007  . CKD (chronic kidney disease) stage 3, GFR 30-59 ml/min 11/24/2007  . Rheumatoid arthritis ([place]) 11/24/2007  . [place] DISEASE, LUMBAR 11/24/2007  . ADENOIDECTOMY, HX OF 11/24/2007    Past Surgical History:  Procedure Laterality Date  . CARDIAC CATHETERIZATION  02-12-2007  dr gregg taylor   Non-obstructive CAD,  20% pLCX,  20%  LAD, ef 65%  . CARDIAC CATHETERIZATION  03-14-2009  dr Johnsie Cancel   pLAD and mid diaginal 20-30% multiple lesions,  OM1 20%, right renal ostial 40-50%,  ef 60%  . CATARACT EXTRACTION W/ INTRAOCULAR LENS  IMPLANT, BILATERAL  2016  . CYSTOSCOPY WITH BIOPSY N/A 07/11/2015   Procedure: CYSTOSCOPY WITH COLD CUP RESECTION AND FULGERATION;  Surgeon: Rana Snare, MD;  Location: [facility];  Service: Urology;  Laterality: N/A;  . HIP ARTHROSCOPY W/ LABRAL DEBRIDEMENT Left 06-29-2000   and chondraplasty  . LUMBAR [place] SURGERY  07-16-2005  . LUMBAR LAMINECTOMY/DECOMPRESSION MICRODISCECTOMY  07/09/2011   Procedure: LUMBAR LAMINECTOMY/DECOMPRESSION MICRODISCECTOMY;  Surgeon: Johnn Hai;  Location: [facility];  Service: Orthopedics;  Laterality: Left;  Decompression L5 - S1 on the Left and repair of dura  . LUMBAR LAMINECTOMY/DECOMPRESSION MICRODISCECTOMY N/A 07/27/2013   Procedure: DECOMPRESSION L4 - L5 WITH  EXCISION OF SYNOVIAL CYST AND, LATERAL MASS FUSION 1 LEVEL;  Surgeon: Johnn Hai, MD;  Location: [facility];  Service: Orthopedics;  Laterality: N/A;  . LUMBAR LAMINECTOMY/DECOMPRESSION MICRODISCECTOMY Left 07/06/2014   Procedure:  MICRO LUMBAR DECOMPRESSION L5-S1 ON LEFT, L4-5 REDO;  Surgeon: Johnn Hai, MD;  Location: [facility];  Service: Orthopedics;  Laterality: Left;  . SPINAL CORD STIMULATOR INSERTION N/A 09/11/2016   Procedure: Spinal cord stimulator placement;  Surgeon: Melina Schools, MD;  Location: [facility] [facility];  Service: Orthopedics;  Laterality: N/A;  . TONSILLECTOMY AND ADENOIDECTOMY    . TRANSTHORACIC ECHOCARDIOGRAM  09-20-2010   normal LVF, ef 55-65%/  mild MR, TR and PR/  mild LAE  . TRANSURETHRAL RESECTION OF BLADDER TUMOR  09-21-2002       Family History  Problem Relation Age of Onset  . Heart attack Mother   . Cancer Father        lung  . Cancer Sister        lung  . Diabetes Sister   . Cancer Brother        lung that spread to brain    Social History   Tobacco  Use  . Smoking status: Former Smoker    Packs/day: 2.00    Years: 20.00    Pack years: 40.00    Types: Cigarettes    Quit date: 10/08/1982    Years since quitting: 37.0  . Smokeless tobacco: Never Used  Substance Use Topics  . Alcohol use: Yes    Comment: rare wine or beer occassionally  . Drug use: No    Home Medications Prior to Admission medications   Medication Sig Start Date End Date Taking? Authorizing Provider  acetaminophen (TYLENOL) 650 MG CR tablet Take 1,300 mg by mouth at bedtime as needed for pain.    [provider]  albuterol (VENTOLIN HFA) 108 (90 Base) MCG/ACT inhaler Inhale 2 puffs into the lungs every 6 (six) hours as needed for wheezing or shortness of breath. 08/09/19   Biagio Borg, MD  amLODipine (NORVASC) 10 MG tablet TAKE 1 TABLET (10 MG TOTAL) DAILY. 09/05/19   Biagio Borg, MD  ascorbic acid (VITAMIN C) 500 MG tablet Take 1 tablet (500 mg total) by mouth daily. 08/21/19   Allie Bossier, MD  benzonatate (TESSALON) 100 MG capsule Take 1-2 capsules (100-200 mg total) by mouth 3 (three) times daily as needed for cough. 09/19/19   Scot Jun, FNP  brimonidine (ALPHAGAN) 0.15 % ophthalmic solution Place 1 drop into both eyes 2 (two) times daily.    [provider]  budesonide-formoterol (SYMBICORT) 160-4.5 MCG/ACT inhaler Inhale 2 puffs into the lungs 2 (two) times daily. 09/19/19   Scot Jun, FNP  cetirizine (ZYRTEC) 10 MG tablet Take 10 mg by mouth daily.    [provider]  citalopram (CELEXA) 10 MG tablet TAKE 1 TABLET EVERY MORNING 10/03/19   Biagio Borg, MD  clonazePAM (KLONOPIN) 0.5 MG tablet Take 1 tablet (0.5 mg total) by mouth 2 (two) times daily as needed for anxiety. 09/23/19   Biagio Borg, MD  clotrimazole (LOTRIMIN) 1 % cream Apply 1 application topically 2 (two) times daily as needed (FOR RASH). Prescribed for rash in groin    [provider]  dextromethorphan-guaiFENesin (MUCINEX DM) 30-600 MG 12hr  tablet Take 1 tablet by mouth 2 (two) times daily.    [provider]  fluticasone (FLONASE) 50 MCG/ACT nasal spray Place 2 sprays into both nostrils  daily. 10/03/19   Biagio Borg, MD  gabapentin (NEURONTIN) 300 MG capsule TAKE 1 TO 2 CAPSULES FOUR TIMES DAILY  AFTER MEALS AND AT BEDTIME. Patient taking differently: Take 300 mg by mouth at bedtime.  06/01/19   Biagio Borg, MD  hydrALAZINE (APRESOLINE) 10 MG tablet TAKE 1 TABLET THREE TIMES DAILY AS NEEDED FOR BLOOD PRESSURE MORE THAN 150/90 09/16/19   Biagio Borg, MD  hydrochlorothiazide (HYDRODIURIL) 25 MG tablet Take 12.5 mg by mouth every morning. 10/18/19   [provider]  HYDROcodone-acetaminophen (NORCO/VICODIN) 5-325 MG tablet Take 1 tablet by mouth every 6 (six) hours as needed for severe pain. 02/15/19   Blanchie Dessert, MD  hydroxypropyl methylcellulose / hypromellose (ISOPTO TEARS / GONIOVISC) 2.5 % ophthalmic solution Place 1 drop into both eyes 2 (two) times daily.    [provider]  insulin aspart (NOVOLOG) 100 UNIT/ML FlexPen Inject 4 Units into the skin 3 (three) times daily with meals. 08/21/19   Allie Bossier, MD  Insulin Detemir (LEVEMIR) 100 UNIT/ML Pen Inject 5 Units into the skin 2 (two) times daily. 08/21/19   Allie Bossier, MD  Insulin Pen Needle 29G X 10MM MISC Use as directed 08/21/19   Allie Bossier, MD  lidocaine (LIDODERM) 5 % Place 1 patch onto the skin daily. Remove & Discard patch within 12 hours or as directed by MD Patient taking differently: Place 1 patch onto the skin daily as needed (back pain). Remove & Discard patch within 12 hours or as directed by MD 02/15/19   Blanchie Dessert, MD  losartan (COZAAR) 100 MG tablet TAKE 1 TABLET EVERY DAY Patient taking differently: Take 100 mg by mouth daily.  08/05/19   Biagio Borg, MD  meclizine (ANTIVERT) 25 MG tablet TAKE 1 TABLET TWICE DAILY AS NEEDED FOR DIZZINESS Patient taking differently: Take 25 mg by mouth 2 (two) times daily as needed  for dizziness.  08/05/19   Biagio Borg, MD  methocarbamol (ROBAXIN) 500 MG tablet Take 500 mg by mouth 3 (three) times daily as needed for muscle spasms.    [provider]  methylPREDNISolone (MEDROL DOSEPAK) 4 MG TBPK tablet Use as directed on package 10/25/19   Wyvonnia Dusky, MD  nitroGLYCERIN (NITROSTAT) 0.4 MG SL tablet PLACE 1 TABLET UNDER THE TONGUE EVERY 5 MINUTES AS NEEDED FOR CHEST PAIN. MAX 3 DOSES. CALL 911 IF NO IMPROVEMENT AFTER 1ST DOSE Patient taking differently: Place 0.4 mg under the tongue every 5 (five) minutes as needed for chest pain.  02/01/18   Biagio Borg, MD  Nutritional Supplements (JUICE PLUS FIBRE PO) Take 1 each 2 (two) times daily by mouth.    [provider]  omeprazole (PRILOSEC) 40 MG capsule TAKE 1 CAPSULE TWICE DAILY Patient taking differently: Take 40 mg by mouth 2 (two) times daily.  08/08/19   Biagio Borg, MD  ondansetron (ZOFRAN) 4 MG tablet Take 1 tablet (4 mg total) by mouth every 8 (eight) hours as needed for nausea or vomiting. 07/22/19   Biagio Borg, MD  potassium chloride (KLOR-CON) 10 MEQ tablet Take 10 mEq by mouth 2 (two) times daily. 09/27/19   [provider]  primidone (MYSOLINE) 250 MG tablet TAKE 1/2 TABLET THREE TIMES DAILY 09/30/19   Biagio Borg, MD  psyllium (METAMUCIL SMOOTH TEXTURE) 58.6 % powder Take 2 tablespoons daily. Drink 12 oz of water 02/25/18   Irene Shipper, MD  simvastatin (ZOCOR) 40 MG tablet TAKE 1  AND 1/2 TABLETS AT BEDTIME 09/23/19   Biagio Borg, MD  tamsulosin (FLOMAX) 0.4 MG CAPS capsule Take 1 capsule (0.4 mg total) by mouth daily. Patient taking differently: Take 0.8 mg by mouth at bedtime.  05/17/15   Biagio Borg, MD  timolol (TIMOPTIC) 0.5 % ophthalmic solution Place 1 drop into both eyes 2 (two) times daily.  05/30/15   [provider]  Travoprost, BAK Free, (TRAVATAN Z) 0.004 % SOLN ophthalmic solution Place 1 drop into both eyes at bedtime. 09/14/13   Biagio Borg, MD    triamcinolone (KENALOG) 0.025 % cream Apply 1 application topically 2 (two) times daily as needed (rash).     [provider]  Vitamin D, Ergocalciferol, (DRISDOL) 1.25 MG (50000 UT) CAPS capsule Take 1 capsule (50,000 Units total) by mouth every 7 (seven) days. 06/27/19   Biagio Borg, MD  zinc sulfate 220 (50 Zn) MG capsule Take 1 capsule (220 mg total) by mouth daily. 08/21/19   Allie Bossier, MD    Allergies    Morphine and related, Hydrochlorothiazide, Tape, Sulfur dioxide, Latex, Lisinopril, and Sulfa antibiotics  Review of Systems   Review of Systems  Constitutional: Positive for fatigue. Negative for chills and fever.  HENT: Negative for ear pain and sore throat.   Eyes: Negative for pain and visual disturbance.  Respiratory: Positive for shortness of breath. Negative for cough.   Cardiovascular: Positive for leg swelling. Negative for chest pain and palpitations.  Gastrointestinal: Positive for diarrhea. Negative for abdominal pain, blood in stool, constipation and vomiting.  Genitourinary: Negative for dysuria and hematuria.  Musculoskeletal: Positive for arthralgias and back pain.  Skin: Negative for color change and rash.  Neurological: Negative for syncope and headaches.  Psychiatric/Behavioral: Negative for agitation and confusion.  All other systems reviewed and are negative.   Physical Exam Updated Vital Signs BP (!) 142/71 (BP Location: Left Arm)   Pulse 72   Temp 98.9 F (37.2 C) (Oral)   Resp 20   Ht 5\' 6"  (1.676 m)   Wt 90.7 kg   SpO2 95%   BMI 32.28 kg/m   Physical Exam Vitals and nursing note reviewed.  Constitutional:      Appearance: He is well-developed.  HENT:     Head: Normocephalic and atraumatic.  Eyes:     Conjunctiva/sclera: Conjunctivae normal.  Cardiovascular:     Rate and Rhythm: Normal rate and regular rhythm.  Pulmonary:     Effort: Pulmonary effort is normal. No respiratory distress.     Comments: Crackles in lung  bases Bilateral end expiratory wheezing RR 25 98% on room air Coarse breath sounds in left lung Abdominal:     Palpations: Abdomen is soft.     Tenderness: There is no abdominal tenderness.  Musculoskeletal:     Cervical back: Neck supple.     Comments: Mild bilateral pitting edema to mid-tibia, symmetrical  Skin:    General: Skin is warm and dry.  Neurological:     General: No focal deficit present.     Mental Status: He is alert and oriented to person, place, and time.  Psychiatric:        Mood and Affect: Mood normal.        Behavior: Behavior normal.     ED Results / Procedures / Treatments   Labs (all labs ordered are listed, but only abnormal results are displayed) Labs Reviewed  CBC WITH DIFFERENTIAL/PLATELET - Abnormal; Notable for the following components:  Result Value   Hemoglobin 12.6 (*)    HCT 38.6 (*)    All other components within normal limits  BASIC METABOLIC PANEL - Abnormal; Notable for the following components:   Glucose, Bld 116 (*)    Calcium 8.7 (*)    All other components within normal limits  HEPATIC FUNCTION PANEL  BRAIN NATRIURETIC PEPTIDE  POC SARS CORONAVIRUS 2 AG -  ED    EKG EKG Interpretation  Date/Time:  Monday October 24 2019 09:41:10 EDT Ventricular Rate:  71 PR Interval:    QRS Duration: 90 QT Interval:  375 QTC Calculation: 408 R Axis:   26 Text Interpretation: Sinus rhythm Minimal ST depression, diffuse leads 12 Lead; [place] Similar to Feb 2021 ecg, no STEMI Confirmed by Octaviano Glow [phone]) on 10/24/2019 9:57:32 AM   Radiology DG Chest 2 View  Result Date: 10/24/2019 CLINICAL DATA:  Cough. Fever. Shortness of breath. Prior COVID pneumonia. EXAM: CHEST - 2 VIEW COMPARISON:  10/12/2019 FINDINGS: Mild hyperinflation. Dorsal spinal stimulator. Patient rotated minimally right. Mild cardiomegaly. No pleural effusion or pneumothorax. Presumed post infectious/inflammatory scarring in the right upper and lower lobes,  similar. No new pulmonary opacity. Minimal linear scarring in the left lung base. IMPRESSION: No acute cardiopulmonary disease. Presumed post infectious/inflammatory scarring on the right. Electronically Signed   By: Abigail Miyamoto M.D.   On: 10/24/2019 10:17    Procedures Procedures (including critical care time)  Medications Ordered in ED Medications  ipratropium-albuterol (DUONEB) 0.5-2.5 (3) MG/3ML nebulizer solution 3 mL (3 mLs Nebulization Given 10/24/19 1520)  albuterol (VENTOLIN HFA) 108 (90 Base) MCG/ACT inhaler 6 puff (6 puffs Inhalation Given 10/24/19 1143)  ipratropium-albuterol (DUONEB) 0.5-2.5 (3) MG/3ML nebulizer solution 3 mL (3 mLs Nebulization Given 10/24/19 1256)  predniSONE (DELTASONE) tablet 60 mg (60 mg Oral Given 10/24/19 1342)    ED Course  I have reviewed the triage vital signs and the nursing notes.  Pertinent labs & imaging results that were available during my care of the patient were reviewed by me and considered in my medical decision making (see chart for details).  This patient complains of cough and shortness of breath.  This involves an extensive number of treatment options, and is a complaint that carries with it a high risk of complications and morbidity.  The differential diagnosis includes COVID-related illness (long covid syndrome) vs. COPD exacerbation vs. CHF exacerbation vs other  Doubt PE, no hypoxia, no tachypnea. Doubt ACS, ECG non ischemic  Less likely bacterial PNA with no fever, no leukocytosis, no focal consolidations.  He has completed 2 full course of antibiotics already; I would not add another at this time.  His lungs sound coarse, likely from COVID scarring.  He has evidence of this on his xray, and unfortunately this may be a chronic problem for him going forward.  I think we can try to treat for COPD to see if beta agonists improve some of his symptoms.  He is not requiring oxygen here.  I also checked a BNP which was normal.  Given this  fact, I am less convinced this is CHF.  He has only mild (and intermittent) LE edema, and otherwise does not examine significantly volume overloaded.  I ordered, reviewed, and interpreted labs, which included BNP, BMP, CBC, HFP.  Covid negative here -suspect it has cleared I ordered medication albuterol, nebs for his wheezing I ordered imaging studies which included xray of the chest I independently visualized and interpreted imaging which showed atelectasis, right sided lung  scarring  and the monitor tracing which showed NSR Additional history was obtained from his son at bedside   After the interventions stated above, I reevaluated the patient and found improvement of his wheezing and his respiratory status.  Started on steroids, advised f/u with PCP as this is likely COPD exacerbation.  He may need a nebulizer at home, but can use his inhaler for now. He verbalized understanding.  Clinical Course as of Oct 24 1822  Mon Oct 24, 2019  1321 Wheezing is now more pronounced after his initial treatment.  I suspect this is infective COPD exacerbation.  Will give another 2 rounds of DuoNeb as well as prednisone.   [MT]    Clinical Course User Index [MT] Thayer Inabinet, Carola Rhine, MD    Final Clinical Impression(s) / ED Diagnoses Final diagnoses:  COPD exacerbation [facility])    Rx / DC Orders ED Discharge Orders         Ordered    methylPREDNISolone (MEDROL DOSEPAK) 4 MG TBPK tablet     10/24/19 1453           Wyvonnia Dusky, MD 10/24/19 1824

## 2019-10-24 NOTE — ED Notes (Signed)
Respiratory called for pt duoneb treatment prior to D/C

## 2019-10-25 ENCOUNTER — Ambulatory Visit (INDEPENDENT_AMBULATORY_CARE_PROVIDER_SITE_OTHER): Payer: Medicare Other | Admitting: Family

## 2019-10-25 ENCOUNTER — Other Ambulatory Visit: Payer: Self-pay

## 2019-10-25 VITALS — BP 130/72 | HR 73 | Temp 98.1°F | Ht 66.0 in | Wt 214.6 lb

## 2019-10-25 DIAGNOSIS — J449 Chronic obstructive pulmonary disease, unspecified: Secondary | ICD-10-CM

## 2019-10-25 MED ORDER — ALBUTEROL SULFATE (2.5 MG/3ML) 0.083% IN NEBU: 2.5000 mg | INHALATION_SOLUTION | Freq: Four times a day (QID) | RESPIRATORY_TRACT | 1 refills | Status: DC | PRN

## 2019-10-25 MED ORDER — IPRATROPIUM-ALBUTEROL 0.5-2.5 (3) MG/3ML IN SOLN: 3.0000 mL | Freq: Four times a day (QID) | RESPIRATORY_TRACT | 0 refills | Status: DC | PRN

## 2019-10-25 NOTE — Progress Notes (Signed)
[name] is a 84 y.o. male with the following history as recorded in EpicCare:  Patient Active Problem List   Diagnosis Date Noted  . History of COVID-19 10/03/2019  . Acute respiratory failure with hypoxia ([place]) 10/03/2019  . Pneumonia due to COVID-19 virus 08/16/2019  . Acute respiratory failure due to COVID-19 ([place]) 08/16/2019  . COVID-19 virus infection 08/09/2019  . Memory loss 06/24/2019  . Wheezing 06/24/2019  . Abrasion of left arm 04/11/2019  . Joint swelling of lower leg 11/29/2018  . Abnormal urinalysis 02/25/2018  . Anxiety 01/08/2018  . Laceration of hand 12/18/2017  . Pain of left hand 12/18/2017  . Laceration of scalp without foreign body 06/09/2017  . Nonintractable headache 06/09/2017  . Acute sinus infection 04/14/2017  . Nausea and vomiting 03/27/2017  . Hyponatremia 03/17/2017  . Right lumbar radiculopathy 01/21/2017  . Fall 01/21/2017  . Synovial cyst 01/21/2017  . Chronic pain 09/11/2016  . Generalized weakness 05/22/2016  . UTI (urinary tract infection) 05/22/2016  . Weakness 05/22/2016  . Fever blister 10/18/2015  . Dyspnea 10/17/2015  . Bradycardia 09/17/2015  . Chest pain at rest 09/17/2015  . CAD in native artery 09/17/2015  . CHF (congestive heart failure) ([place]) 09/17/2015  . Pulmonary hypertension ([place]) 09/17/2015  . Bladder cancer ([place]) 09/17/2015  . Essential tremor 09/17/2015  . Chest pain 09/17/2015  . Anginal chest pain at rest [facility]) 09/17/2015  . Hypokalemia 07/13/2015  . Vertigo 07/01/2015  . Dizzinesses 07/01/2015  . Spinal stenosis of lumbar region at multiple levels 07/06/2014  . Angular cheilitis 05/05/2014  . Left lumbar radiculopathy 05/04/2014  . Right elbow pain 12/29/2013  . Lateral epicondylitis of right elbow 12/29/2013  . Cough 09/14/2013  . Spinal stenosis, lumbar 07/27/2013  . Palpable mass of neck 07/21/2013  . Vasovagal episode 01/10/2013  . Left otitis media 10/28/2012  . Impaired glucose tolerance  10/28/2012  . Cellulitis 04/24/2012  . Hemorrhoids 04/24/2012  . Syncope 04/21/2012  . Preventative health care 11/28/2010  . Depression 09/12/2010  . RENAL ARTERY STENOSIS 09/12/2010  . FATIGUE 09/12/2010  . BUNION, RIGHT FOOT 02/05/2010  . RECTAL BLEEDING 07/10/2009  . Diarrhea 07/10/2009  . DIVERTICULOSIS OF COLON 03/28/2009  . FATTY LIVER DISEASE 03/28/2009  . BRADYCARDIA 03/20/2009  . DIZZINESS 10/17/2008  . CHEST PAIN 10/17/2008  . Malignant neoplasm of bladder ([place]) 02/07/2008  . Allergic rhinitis 02/07/2008  . COLONIC POLYPS, HX OF 02/07/2008  . Hyperlipidemia 11/24/2007  . GERD 11/24/2007  . HIATAL HERNIA WITH REFLUX 11/24/2007  . CKD (chronic kidney disease) stage 3, GFR 30-59 ml/min 11/24/2007  . Rheumatoid arthritis ([place]) 11/24/2007  . [place] DISEASE, LUMBAR 11/24/2007  . ADENOIDECTOMY, HX OF 11/24/2007    Current Outpatient Medications  Medication Sig Dispense Refill  . acetaminophen (TYLENOL) 650 MG CR tablet Take 1,300 mg by mouth at bedtime as needed for pain.    Marland Kitchen albuterol (VENTOLIN HFA) 108 (90 Base) MCG/ACT inhaler Inhale 2 puffs into the lungs every 6 (six) hours as needed for wheezing or shortness of breath. 54 g 3  . amLODipine (NORVASC) 10 MG tablet TAKE 1 TABLET (10 MG TOTAL) DAILY. 90 tablet 1  . ascorbic acid (VITAMIN C) 500 MG tablet Take 1 tablet (500 mg total) by mouth daily. 30 tablet 0  . benzonatate (TESSALON) 100 MG capsule Take 1-2 capsules (100-200 mg total) by mouth 3 (three) times daily as needed for cough. 60 capsule 2  . brimonidine (ALPHAGAN) 0.15 % ophthalmic solution Place 1  drop into both eyes 2 (two) times daily.    . budesonide-formoterol (SYMBICORT) 160-4.5 MCG/ACT inhaler Inhale 2 puffs into the lungs 2 (two) times daily. 1 Inhaler 3  . cetirizine (ZYRTEC) 10 MG tablet Take 10 mg by mouth daily.    . citalopram (CELEXA) 10 MG tablet TAKE 1 TABLET EVERY MORNING 90 tablet 1  . clonazePAM (KLONOPIN) 0.5 MG tablet Take 1 tablet (0.5 mg  total) by mouth 2 (two) times daily as needed for anxiety. 60 tablet 1  . clotrimazole (LOTRIMIN) 1 % cream Apply 1 application topically 2 (two) times daily as needed (FOR RASH). Prescribed for rash in groin    . dextromethorphan-guaiFENesin (MUCINEX DM) 30-600 MG 12hr tablet Take 1 tablet by mouth 2 (two) times daily.    . fluticasone (FLONASE) 50 MCG/ACT nasal spray Place 2 sprays into both nostrils daily. 48 g 1  . gabapentin (NEURONTIN) 300 MG capsule TAKE 1 TO 2 CAPSULES FOUR TIMES DAILY  AFTER MEALS AND AT BEDTIME. (Patient taking differently: Take 300 mg by mouth at bedtime. ) 720 capsule 3  . hydrALAZINE (APRESOLINE) 10 MG tablet TAKE 1 TABLET THREE TIMES DAILY AS NEEDED FOR BLOOD PRESSURE MORE THAN 150/90 270 tablet 0  . hydrochlorothiazide (HYDRODIURIL) 25 MG tablet Take 12.5 mg by mouth every morning.    Marland Kitchen HYDROcodone-acetaminophen (NORCO/VICODIN) 5-325 MG tablet Take 1 tablet by mouth every 6 (six) hours as needed for severe pain. 10 tablet 0  . hydroxypropyl methylcellulose / hypromellose (ISOPTO TEARS / GONIOVISC) 2.5 % ophthalmic solution Place 1 drop into both eyes 2 (two) times daily.    . insulin aspart (NOVOLOG) 100 UNIT/ML FlexPen Inject 4 Units into the skin 3 (three) times daily with meals. 6 mL 0  . Insulin Detemir (LEVEMIR) 100 UNIT/ML Pen Inject 5 Units into the skin 2 (two) times daily. 6 mL 0  . Insulin Pen Needle 29G X 10MM MISC Use as directed 200 each 0  . lidocaine (LIDODERM) 5 % Place 1 patch onto the skin daily. Remove & Discard patch within 12 hours or as directed by MD (Patient taking differently: Place 1 patch onto the skin daily as needed (back pain). Remove & Discard patch within 12 hours or as directed by MD) 30 patch 0  . losartan (COZAAR) 100 MG tablet TAKE 1 TABLET EVERY DAY (Patient taking differently: Take 100 mg by mouth daily. ) 90 tablet 2  . meclizine (ANTIVERT) 25 MG tablet TAKE 1 TABLET TWICE DAILY AS NEEDED FOR DIZZINESS (Patient taking differently:  Take 25 mg by mouth 2 (two) times daily as needed for dizziness. ) 180 tablet 1  . methocarbamol (ROBAXIN) 500 MG tablet Take 500 mg by mouth 3 (three) times daily as needed for muscle spasms.    . methylPREDNISolone (MEDROL DOSEPAK) 4 MG TBPK tablet Use as directed on package 21 tablet 0  . nitroGLYCERIN (NITROSTAT) 0.4 MG SL tablet PLACE 1 TABLET UNDER THE TONGUE EVERY 5 MINUTES AS NEEDED FOR CHEST PAIN. MAX 3 DOSES. CALL 911 IF NO IMPROVEMENT AFTER 1ST DOSE (Patient taking differently: Place 0.4 mg under the tongue every 5 (five) minutes as needed for chest pain. ) 25 tablet 0  . Nutritional Supplements (JUICE PLUS FIBRE PO) Take 1 each 2 (two) times daily by mouth.    Marland Kitchen omeprazole (PRILOSEC) 40 MG capsule TAKE 1 CAPSULE TWICE DAILY (Patient taking differently: Take 40 mg by mouth 2 (two) times daily. ) 180 capsule 3  . ondansetron (ZOFRAN) 4 MG tablet  Take 1 tablet (4 mg total) by mouth every 8 (eight) hours as needed for nausea or vomiting. 30 tablet 2  . potassium chloride (KLOR-CON) 10 MEQ tablet Take 10 mEq by mouth 2 (two) times daily.    . primidone (MYSOLINE) 250 MG tablet TAKE 1/2 TABLET THREE TIMES DAILY 135 tablet 1  . psyllium (METAMUCIL SMOOTH TEXTURE) 58.6 % powder Take 2 tablespoons daily. Drink 12 oz of water 283 g 12  . simvastatin (ZOCOR) 40 MG tablet TAKE 1 AND 1/2 TABLETS AT BEDTIME 135 tablet 1  . tamsulosin (FLOMAX) 0.4 MG CAPS capsule Take 1 capsule (0.4 mg total) by mouth daily. (Patient taking differently: Take 0.8 mg by mouth at bedtime. ) 90 capsule 3  . timolol (TIMOPTIC) 0.5 % ophthalmic solution Place 1 drop into both eyes 2 (two) times daily.   1  . Travoprost, BAK Free, (TRAVATAN Z) 0.004 % SOLN ophthalmic solution Place 1 drop into both eyes at bedtime. 5 mL 5  . triamcinolone (KENALOG) 0.025 % cream Apply 1 application topically 2 (two) times daily as needed (rash).     . Vitamin D, Ergocalciferol, (DRISDOL) 1.25 MG (50000 UT) CAPS capsule Take 1 capsule (50,000  Units total) by mouth every 7 (seven) days. 12 capsule 0  . zinc sulfate 220 (50 Zn) MG capsule Take 1 capsule (220 mg total) by mouth daily. 30 capsule 0  . albuterol (PROVENTIL) (2.5 MG/3ML) 0.083% nebulizer solution Take 3 mLs (2.5 mg total) by nebulization every 6 (six) hours as needed for wheezing or shortness of breath. 150 mL 1  . ipratropium-albuterol (DUONEB) 0.5-2.5 (3) MG/3ML SOLN Take 3 mLs by nebulization every 6 (six) hours as needed. 360 mL 0   No current facility-administered medications for this visit.    Allergies: Morphine and related, Hydrochlorothiazide, Tape, Sulfur dioxide, Latex, Lisinopril, and Sulfa antibiotics  Past Medical History:  Diagnosis Date  . Acute bronchitis per pt no fever--  cough since discharge from hospital 07-02-2015   per pcp note 07-05-2015  mild to moderate bronchitis vs pna  . Allergic rhinitis   . Bladder cancer ([place])   . BPH (benign prostatic hyperplasia)   . Bradycardia   . CAD (coronary artery disease)   . Chronic low back pain   . DDD (degenerative disc disease), lumbosacral   . Depression   . Diverticulosis of colon (without mention of hemorrhage)   . Dyspnea   . Essential tremor   . Fatty liver disease, nonalcoholic   . GERD (gastroesophageal reflux disease)   . Glaucoma   . History of bladder cancer    2004--  TCC low-grade , non-invasive  . History of colon polyps   . History of exercise stress test    02-05-2007-- ETT (Clinically positive, electrically equivocal, submaximal ETT,  appropriate bp response to exercise  . History of hiatal hernia   . History of kidney stones   . History of peptic ulcer   . HLD (hyperlipidemia)   . HTN (hypertension)   . Nocturia   . Productive cough   . Rheumatoid arthritis(714.0)   . Right renal artery stenosis (HCC)    per cath 03-14-2009 --- 40-50%  . RLS (restless legs syndrome)   . Vertigo, intermittent   . Weak urinary stream   . Wears dentures    upper  . Wears glasses   .  Wears hearing aid    bilateral  . Wears partial dentures    lower    Past Surgical History:  Procedure Laterality Date  . CARDIAC CATHETERIZATION  02-12-2007  dr gregg taylor   Non-obstructive CAD,  20% pLCX,  20% LAD, ef 65%  . CARDIAC CATHETERIZATION  03-14-2009  dr Johnsie Cancel   pLAD and mid diaginal 20-30% multiple lesions,  OM1 20%, right renal ostial 40-50%,  ef 60%  . CATARACT EXTRACTION W/ INTRAOCULAR LENS  IMPLANT, BILATERAL  2016  . CYSTOSCOPY WITH BIOPSY N/A 07/11/2015   Procedure: CYSTOSCOPY WITH COLD CUP RESECTION AND FULGERATION;  Surgeon: Rana Snare, MD;  Location: [facility];  Service: Urology;  Laterality: N/A;  . HIP ARTHROSCOPY W/ LABRAL DEBRIDEMENT Left 06-29-2000   and chondraplasty  . LUMBAR Chloride SURGERY  07-16-2005  . LUMBAR LAMINECTOMY/DECOMPRESSION MICRODISCECTOMY  07/09/2011   Procedure: LUMBAR LAMINECTOMY/DECOMPRESSION MICRODISCECTOMY;  Surgeon: Johnn Hai;  Location: [facility];  Service: Orthopedics;  Laterality: Left;  Decompression L5 - S1 on the Left and repair of dura  . LUMBAR LAMINECTOMY/DECOMPRESSION MICRODISCECTOMY N/A 07/27/2013   Procedure: DECOMPRESSION L4 - L5 WITH EXCISION OF SYNOVIAL CYST AND, LATERAL MASS FUSION 1 LEVEL;  Surgeon: Johnn Hai, MD;  Location: [facility];  Service: Orthopedics;  Laterality: N/A;  . LUMBAR LAMINECTOMY/DECOMPRESSION MICRODISCECTOMY Left 07/06/2014   Procedure:  MICRO LUMBAR DECOMPRESSION L5-S1 ON LEFT, L4-5 REDO;  Surgeon: Johnn Hai, MD;  Location: [facility];  Service: Orthopedics;  Laterality: Left;  . SPINAL CORD STIMULATOR INSERTION N/A 09/11/2016   Procedure: Spinal cord stimulator placement;  Surgeon: Melina Schools, MD;  Location: [place];  Service: Orthopedics;  Laterality: N/A;  . TONSILLECTOMY AND ADENOIDECTOMY    . TRANSTHORACIC ECHOCARDIOGRAM  09-20-2010   normal LVF, ef 55-65%/  mild MR, TR and PR/  mild LAE  . TRANSURETHRAL RESECTION OF BLADDER TUMOR  09-21-2002    Family History  Problem  Relation Age of Onset  . Heart attack Mother   . Cancer Father        lung  . Cancer Sister        lung  . Diabetes Sister   . Cancer Brother        lung that spread to brain    Social History   Tobacco Use  . Smoking status: Former Smoker    Packs/day: 2.00    Years: 20.00    Pack years: 40.00    Types: Cigarettes    Quit date: 10/08/1982    Years since quitting: 37.0  . Smokeless tobacco: Never Used  Substance Use Topics  . Alcohol use: Yes    Comment: rare wine or beer occassionally    Subjective:  Patient was seen at the ER yesterday with COPD exacerbation; was given nebulizer treatment and started on oral steroids; notes that he is already starting to feel better but would like to discuss getting updated prescription for nebulizer; COPD is being managed by provider at the [facility]- has primary care provider there and will discuss with them about seeing lung specialist through the [facility].      Objective:  Vitals:   10/25/19 1140  BP: 130/72  Pulse: 73  Temp: 98.1 F (36.7 C)  TempSrc: Oral  SpO2: 97%  Weight: 214 lb 9.6 oz (97.3 kg)  Height: 5\' 6"  (1.676 m)    General: Well developed, well nourished, in no acute distress  Skin : Warm and dry.  Head: Normocephalic and atraumatic  Lungs: Respirations unlabored; clear to auscultation bilaterally without wheeze, rales, rhonchi  CVS exam: normal rate and regular rhythm.  Neurologic: Alert and oriented; speech  intact; face symmetrical; moves all extremities well; CNII-XII intact without focal deficit   Assessment:  1. Chronic obstructive pulmonary disease, unspecified COPD type ([place])     Plan:  Care is being managed by [place]; patient is planning to ask for referral to pulmonologist in the [place]; Rx for nebulizer and duo-neb solution given to patient today; he wants to get these filled through the [place]; Keep planned follow-up with PCP here as already scheduled.  This visit occurred during the SARS-CoV-2 public health emergency.   Safety protocols were in place, including screening questions prior to the visit, additional usage of staff PPE, and extensive cleaning of exam room while observing appropriate contact time as indicated for disinfecting solutions.     No follow-ups on file.  Orders Placed This Encounter  Procedures  . For home use only DME Nebulizer machine    Order Specific Question:   Patient needs a nebulizer to treat with the following condition    Answer:   [facility]chronic [facility] pulmonary disease) [id number]    Order Specific Question:   Length of Need    Answer:   Lifetime    Requested Prescriptions   Signed Prescriptions Disp Refills  . albuterol (PROVENTIL) (2.5 MG/3ML) 0.083% nebulizer solution 150 mL 1    Sig: Take 3 mLs (2.5 mg total) by nebulization every 6 (six) hours as needed for wheezing or shortness of breath.  Marland Kitchen ipratropium-albuterol (DUONEB) 0.5-2.5 (3) MG/3ML SOLN 360 mL 0    Sig: Take 3 mLs by nebulization every 6 (six) hours as needed.

## 2019-10-31 ENCOUNTER — Ambulatory Visit (INDEPENDENT_AMBULATORY_CARE_PROVIDER_SITE_OTHER): Payer: Medicare Other | Admitting: Family

## 2019-10-31 ENCOUNTER — Encounter: Payer: Self-pay | Admitting: Family

## 2019-10-31 ENCOUNTER — Ambulatory Visit (INDEPENDENT_AMBULATORY_CARE_PROVIDER_SITE_OTHER): Payer: Medicare Other

## 2019-10-31 ENCOUNTER — Other Ambulatory Visit: Payer: Self-pay

## 2019-10-31 ENCOUNTER — Telehealth: Payer: Self-pay

## 2019-10-31 VITALS — BP 160/70 | HR 70 | Temp 98.0°F | Wt 220.2 lb

## 2019-10-31 DIAGNOSIS — I509 Heart failure, unspecified: Secondary | ICD-10-CM

## 2019-10-31 DIAGNOSIS — J439 Emphysema, unspecified: Secondary | ICD-10-CM | POA: Diagnosis not present

## 2019-10-31 DIAGNOSIS — R600 Localized edema: Secondary | ICD-10-CM

## 2019-10-31 LAB — COMPREHENSIVE METABOLIC PANEL
ALT: 41 U/L (ref 0–53)
AST: 31 U/L (ref 0–37)
Albumin: 4.1 g/dL (ref 3.5–5.2)
Alkaline Phosphatase: 86 U/L (ref 39–117)
BUN: 18 mg/dL (ref 6–23)
CO2: 27 mEq/L (ref 19–32)
Calcium: 8.8 mg/dL (ref 8.4–10.5)
Chloride: 102 mEq/L (ref 96–112)
Creatinine, Ser: 0.82 mg/dL (ref 0.40–1.50)
GFR: 89.88 mL/min (ref >60.00)
Glucose, Bld: 108 mg/dL — ABNORMAL HIGH (ref 70–99)
Potassium: 4.3 mEq/L (ref 3.5–5.1)
Sodium: 136 mEq/L (ref 135–145)
Total Bilirubin: 0.3 mg/dL (ref 0.2–1.2)
Total Protein: 6.6 g/dL (ref 6.0–8.3)

## 2019-10-31 LAB — BRAIN NATRIURETIC PEPTIDE: Pro B Natriuretic peptide (BNP): 118 pg/mL — ABNORMAL HIGH (ref 0.0–100.0)

## 2019-10-31 MED ORDER — ALBUTEROL SULFATE HFA 108 (90 BASE) MCG/ACT IN AERS: 2.0000 | INHALATION_SPRAY | Freq: Four times a day (QID) | RESPIRATORY_TRACT | 11 refills | Status: AC | PRN

## 2019-10-31 MED ORDER — FUROSEMIDE 20 MG PO TABS: 20.0000 mg | ORAL_TABLET | Freq: Two times a day (BID) | ORAL | 0 refills | Status: DC | PRN

## 2019-10-31 MED ORDER — BENZONATATE 100 MG PO CAPS: 100.0000 mg | ORAL_CAPSULE | Freq: Three times a day (TID) | ORAL | 2 refills | Status: DC | PRN

## 2019-10-31 NOTE — Telephone Encounter (Signed)
    Patients son called back with questions about the need to take patient to the ED. Advised son the instruction was : Plan:  Re-check CXR, CMP and BNP today; Rx for Lasix 20 mg bid prn for fluid/ edema; he is encouraged to see his cardiologist at the [facility] in follow up; strict ER precautions discussed as well- he notes he will most likely go to the ER at [facility] if necessary.  Son requesting Albuterol be sent to local pharmacy.   1.Medication Requested: Albuterol  2. Pharmacy (Name, [address], [place], [place] [facility] [place]  3. On Med List: yes  4. Last Visit with PCP: 10/31/19  5. Next visit date with PCP:   Agent: Please be advised that RX refills may take up to 3 business days. We ask that you follow-up with your pharmacy.

## 2019-10-31 NOTE — Telephone Encounter (Signed)
[place] for albuterol HFA _ Done erx to local pharmacy on record

## 2019-10-31 NOTE — Progress Notes (Signed)
[name] is a 84 y.o. male with the following history as recorded in EpicCare:  Patient Active Problem List   Diagnosis Date Noted  . History of COVID-19 10/03/2019  . Acute respiratory failure with hypoxia ([place]) 10/03/2019  . Pneumonia due to COVID-19 virus 08/16/2019  . Acute respiratory failure due to COVID-19 ([place]) 08/16/2019  . COVID-19 virus infection 08/09/2019  . Memory loss 06/24/2019  . Wheezing 06/24/2019  . Abrasion of left arm 04/11/2019  . Joint swelling of lower leg 11/29/2018  . Abnormal urinalysis 02/25/2018  . Anxiety 01/08/2018  . Laceration of hand 12/18/2017  . Pain of left hand 12/18/2017  . Laceration of scalp without foreign body 06/09/2017  . Nonintractable headache 06/09/2017  . Acute sinus infection 04/14/2017  . Nausea and vomiting 03/27/2017  . Hyponatremia 03/17/2017  . Right lumbar radiculopathy 01/21/2017  . Fall 01/21/2017  . Synovial cyst 01/21/2017  . Chronic pain 09/11/2016  . Generalized weakness 05/22/2016  . UTI (urinary tract infection) 05/22/2016  . Weakness 05/22/2016  . Fever blister 10/18/2015  . Dyspnea 10/17/2015  . Bradycardia 09/17/2015  . Chest pain at rest 09/17/2015  . CAD in native artery 09/17/2015  . CHF (congestive heart failure) ([place]) 09/17/2015  . Pulmonary hypertension ([place]) 09/17/2015  . Bladder cancer ([place]) 09/17/2015  . Essential tremor 09/17/2015  . Chest pain 09/17/2015  . Anginal chest pain at rest [facility]) 09/17/2015  . Hypokalemia 07/13/2015  . Vertigo 07/01/2015  . Dizzinesses 07/01/2015  . Spinal stenosis of lumbar region at multiple levels 07/06/2014  . Angular cheilitis 05/05/2014  . Left lumbar radiculopathy 05/04/2014  . Right elbow pain 12/29/2013  . Lateral epicondylitis of right elbow 12/29/2013  . Cough 09/14/2013  . Spinal stenosis, lumbar 07/27/2013  . Palpable mass of neck 07/21/2013  . Vasovagal episode 01/10/2013  . Left otitis media 10/28/2012  . Impaired glucose tolerance  10/28/2012  . Cellulitis 04/24/2012  . Hemorrhoids 04/24/2012  . Syncope 04/21/2012  . Preventative health care 11/28/2010  . Depression 09/12/2010  . RENAL ARTERY STENOSIS 09/12/2010  . FATIGUE 09/12/2010  . BUNION, RIGHT FOOT 02/05/2010  . RECTAL BLEEDING 07/10/2009  . Diarrhea 07/10/2009  . DIVERTICULOSIS OF COLON 03/28/2009  . FATTY LIVER DISEASE 03/28/2009  . BRADYCARDIA 03/20/2009  . DIZZINESS 10/17/2008  . CHEST PAIN 10/17/2008  . Malignant neoplasm of bladder ([place]) 02/07/2008  . Allergic rhinitis 02/07/2008  . COLONIC POLYPS, HX OF 02/07/2008  . Hyperlipidemia 11/24/2007  . GERD 11/24/2007  . HIATAL HERNIA WITH REFLUX 11/24/2007  . CKD (chronic kidney disease) stage 3, GFR 30-59 ml/min 11/24/2007  . Rheumatoid arthritis ([place]) 11/24/2007  . [place] DISEASE, LUMBAR 11/24/2007  . ADENOIDECTOMY, HX OF 11/24/2007    Current Outpatient Medications  Medication Sig Dispense Refill  . acetaminophen (TYLENOL) 650 MG CR tablet Take 1,300 mg by mouth at bedtime as needed for pain.    Marland Kitchen albuterol (PROVENTIL) (2.5 MG/3ML) 0.083% nebulizer solution Take 3 mLs (2.5 mg total) by nebulization every 6 (six) hours as needed for wheezing or shortness of breath. 150 mL 1  . albuterol (VENTOLIN HFA) 108 (90 Base) MCG/ACT inhaler Inhale 2 puffs into the lungs every 6 (six) hours as needed for wheezing or shortness of breath. 54 g 3  . amLODipine (NORVASC) 10 MG tablet TAKE 1 TABLET (10 MG TOTAL) DAILY. 90 tablet 1  . ascorbic acid (VITAMIN C) 500 MG tablet Take 1 tablet (500 mg total) by mouth daily. 30 tablet 0  . benzonatate (TESSALON)  100 MG capsule Take 1-2 capsules (100-200 mg total) by mouth 3 (three) times daily as needed for cough. 60 capsule 2  . brimonidine (ALPHAGAN) 0.15 % ophthalmic solution Place 1 drop into both eyes 2 (two) times daily.    . budesonide-formoterol (SYMBICORT) 160-4.5 MCG/ACT inhaler Inhale 2 puffs into the lungs 2 (two) times daily. 1 Inhaler 3  . cetirizine (ZYRTEC)  10 MG tablet Take 10 mg by mouth daily.    . citalopram (CELEXA) 10 MG tablet TAKE 1 TABLET EVERY MORNING 90 tablet 1  . clonazePAM (KLONOPIN) 0.5 MG tablet Take 1 tablet (0.5 mg total) by mouth 2 (two) times daily as needed for anxiety. 60 tablet 1  . clotrimazole (LOTRIMIN) 1 % cream Apply 1 application topically 2 (two) times daily as needed (FOR RASH). Prescribed for rash in groin    . dextromethorphan-guaiFENesin (MUCINEX DM) 30-600 MG 12hr tablet Take 1 tablet by mouth 2 (two) times daily.    . fluticasone (FLONASE) 50 MCG/ACT nasal spray Place 2 sprays into both nostrils daily. 48 g 1  . gabapentin (NEURONTIN) 300 MG capsule TAKE 1 TO 2 CAPSULES FOUR TIMES DAILY  AFTER MEALS AND AT BEDTIME. (Patient taking differently: Take 300 mg by mouth at bedtime. ) 720 capsule 3  . hydrALAZINE (APRESOLINE) 10 MG tablet TAKE 1 TABLET THREE TIMES DAILY AS NEEDED FOR BLOOD PRESSURE MORE THAN 150/90 270 tablet 0  . hydrochlorothiazide (HYDRODIURIL) 25 MG tablet Take 12.5 mg by mouth every morning.    Marland Kitchen HYDROcodone-acetaminophen (NORCO/VICODIN) 5-325 MG tablet Take 1 tablet by mouth every 6 (six) hours as needed for severe pain. 10 tablet 0  . hydroxypropyl methylcellulose / hypromellose (ISOPTO TEARS / GONIOVISC) 2.5 % ophthalmic solution Place 1 drop into both eyes 2 (two) times daily.    . insulin aspart (NOVOLOG) 100 UNIT/ML FlexPen Inject 4 Units into the skin 3 (three) times daily with meals. 6 mL 0  . Insulin Detemir (LEVEMIR) 100 UNIT/ML Pen Inject 5 Units into the skin 2 (two) times daily. 6 mL 0  . Insulin Pen Needle 29G X 10MM MISC Use as directed 200 each 0  . ipratropium-albuterol (DUONEB) 0.5-2.5 (3) MG/3ML SOLN Take 3 mLs by nebulization every 6 (six) hours as needed. 360 mL 0  . lidocaine (LIDODERM) 5 % Place 1 patch onto the skin daily. Remove & Discard patch within 12 hours or as directed by MD (Patient taking differently: Place 1 patch onto the skin daily as needed (back pain). Remove &  Discard patch within 12 hours or as directed by MD) 30 patch 0  . losartan (COZAAR) 100 MG tablet TAKE 1 TABLET EVERY DAY (Patient taking differently: Take 100 mg by mouth daily. ) 90 tablet 2  . meclizine (ANTIVERT) 25 MG tablet TAKE 1 TABLET TWICE DAILY AS NEEDED FOR DIZZINESS (Patient taking differently: Take 25 mg by mouth 2 (two) times daily as needed for dizziness. ) 180 tablet 1  . methocarbamol (ROBAXIN) 500 MG tablet Take 500 mg by mouth 3 (three) times daily as needed for muscle spasms.    . methylPREDNISolone (MEDROL DOSEPAK) 4 MG TBPK tablet Use as directed on package 21 tablet 0  . nitroGLYCERIN (NITROSTAT) 0.4 MG SL tablet PLACE 1 TABLET UNDER THE TONGUE EVERY 5 MINUTES AS NEEDED FOR CHEST PAIN. MAX 3 DOSES. CALL 911 IF NO IMPROVEMENT AFTER 1ST DOSE (Patient taking differently: Place 0.4 mg under the tongue every 5 (five) minutes as needed for chest pain. ) 25 tablet 0  .  Nutritional Supplements (JUICE PLUS FIBRE PO) Take 1 each 2 (two) times daily by mouth.    Marland Kitchen omeprazole (PRILOSEC) 40 MG capsule TAKE 1 CAPSULE TWICE DAILY (Patient taking differently: Take 40 mg by mouth 2 (two) times daily. ) 180 capsule 3  . ondansetron (ZOFRAN) 4 MG tablet Take 1 tablet (4 mg total) by mouth every 8 (eight) hours as needed for nausea or vomiting. 30 tablet 2  . potassium chloride (KLOR-CON) 10 MEQ tablet Take 10 mEq by mouth 2 (two) times daily.    . primidone (MYSOLINE) 250 MG tablet TAKE 1/2 TABLET THREE TIMES DAILY 135 tablet 1  . psyllium (METAMUCIL SMOOTH TEXTURE) 58.6 % powder Take 2 tablespoons daily. Drink 12 oz of water 283 g 12  . simvastatin (ZOCOR) 40 MG tablet TAKE 1 AND 1/2 TABLETS AT BEDTIME 135 tablet 1  . tamsulosin (FLOMAX) 0.4 MG CAPS capsule Take 1 capsule (0.4 mg total) by mouth daily. (Patient taking differently: Take 0.8 mg by mouth at bedtime. ) 90 capsule 3  . timolol (TIMOPTIC) 0.5 % ophthalmic solution Place 1 drop into both eyes 2 (two) times daily.   1  . Travoprost, BAK  Free, (TRAVATAN Z) 0.004 % SOLN ophthalmic solution Place 1 drop into both eyes at bedtime. 5 mL 5  . triamcinolone (KENALOG) 0.025 % cream Apply 1 application topically 2 (two) times daily as needed (rash).     . Vitamin D, Ergocalciferol, (DRISDOL) 1.25 MG (50000 UT) CAPS capsule Take 1 capsule (50,000 Units total) by mouth every 7 (seven) days. 12 capsule 0  . zinc sulfate 220 (50 Zn) MG capsule Take 1 capsule (220 mg total) by mouth daily. 30 capsule 0  . furosemide (LASIX) 20 MG tablet Take 1 tablet (20 mg total) by mouth 2 (two) times daily as needed for fluid or edema. 30 tablet 0   No current facility-administered medications for this visit.    Allergies: Morphine and related, Hydrochlorothiazide, Tape, Sulfur dioxide, Latex, Lisinopril, and Sulfa antibiotics  Past Medical History:  Diagnosis Date  . Acute bronchitis per pt no fever--  cough since discharge from hospital 07-02-2015   per pcp note 07-05-2015  mild to moderate bronchitis vs pna  . Allergic rhinitis   . Bladder cancer ([place])   . BPH (benign prostatic hyperplasia)   . Bradycardia   . CAD (coronary artery disease)   . Chronic low back pain   . DDD (degenerative disc disease), lumbosacral   . Depression   . Diverticulosis of colon (without mention of hemorrhage)   . Dyspnea   . Essential tremor   . Fatty liver disease, nonalcoholic   . GERD (gastroesophageal reflux disease)   . Glaucoma   . History of bladder cancer    2004--  TCC low-grade , non-invasive  . History of colon polyps   . History of exercise stress test    02-05-2007-- ETT (Clinically positive, electrically equivocal, submaximal ETT,  appropriate bp response to exercise  . History of hiatal hernia   . History of kidney stones   . History of peptic ulcer   . HLD (hyperlipidemia)   . HTN (hypertension)   . Nocturia   . Productive cough   . Rheumatoid arthritis(714.0)   . Right renal artery stenosis (HCC)    per cath 03-14-2009 --- 40-50%  . RLS  (restless legs syndrome)   . Vertigo, intermittent   . Weak urinary stream   . Wears dentures    upper  . Wears  glasses   . Wears hearing aid    bilateral  . Wears partial dentures    lower    Past Surgical History:  Procedure Laterality Date  . CARDIAC CATHETERIZATION  02-12-2007  dr gregg taylor   Non-obstructive CAD,  20% pLCX,  20% LAD, ef 65%  . CARDIAC CATHETERIZATION  03-14-2009  dr Johnsie Cancel   pLAD and mid diaginal 20-30% multiple lesions,  OM1 20%, right renal ostial 40-50%,  ef 60%  . CATARACT EXTRACTION W/ INTRAOCULAR LENS  IMPLANT, BILATERAL  2016  . CYSTOSCOPY WITH BIOPSY N/A 07/11/2015   Procedure: CYSTOSCOPY WITH COLD CUP RESECTION AND FULGERATION;  Surgeon: Rana Snare, MD;  Location: [facility];  Service: Urology;  Laterality: N/A;  . HIP ARTHROSCOPY W/ LABRAL DEBRIDEMENT Left 06-29-2000   and chondraplasty  . LUMBAR [place] SURGERY  07-16-2005  . LUMBAR LAMINECTOMY/DECOMPRESSION MICRODISCECTOMY  07/09/2011   Procedure: LUMBAR LAMINECTOMY/DECOMPRESSION MICRODISCECTOMY;  Surgeon: Johnn Hai;  Location: [facility];  Service: Orthopedics;  Laterality: Left;  Decompression L5 - S1 on the Left and repair of dura  . LUMBAR LAMINECTOMY/DECOMPRESSION MICRODISCECTOMY N/A 07/27/2013   Procedure: DECOMPRESSION L4 - L5 WITH EXCISION OF SYNOVIAL CYST AND, LATERAL MASS FUSION 1 LEVEL;  Surgeon: Johnn Hai, MD;  Location: [facility];  Service: Orthopedics;  Laterality: N/A;  . LUMBAR LAMINECTOMY/DECOMPRESSION MICRODISCECTOMY Left 07/06/2014   Procedure:  MICRO LUMBAR DECOMPRESSION L5-S1 ON LEFT, L4-5 REDO;  Surgeon: Johnn Hai, MD;  Location: [facility];  Service: Orthopedics;  Laterality: Left;  . SPINAL CORD STIMULATOR INSERTION N/A 09/11/2016   Procedure: Spinal cord stimulator placement;  Surgeon: Melina Schools, MD;  Location: [facility];  Service: Orthopedics;  Laterality: N/A;  . TONSILLECTOMY AND ADENOIDECTOMY    . TRANSTHORACIC ECHOCARDIOGRAM  09-20-2010   normal LVF,  ef 55-65%/  mild MR, TR and PR/  mild LAE  . TRANSURETHRAL RESECTION OF BLADDER TUMOR  09-21-2002    Family History  Problem Relation Age of Onset  . Heart attack Mother   . Cancer Father        lung  . Cancer Sister        lung  . Diabetes Sister   . Cancer Brother        lung that spread to brain    Social History   Tobacco Use  . Smoking status: Former Smoker    Packs/day: 2.00    Years: 20.00    Pack years: 40.00    Types: Cigarettes    Quit date: 10/08/1982    Years since quitting: 37.0  . Smokeless tobacco: Never Used  Substance Use Topics  . Alcohol use: Yes    Comment: rare wine or beer occassionally    Subjective:  Patient presents with swelling in both of his feet x 3 days; does have history of CHF; weight today is up 6 pounds compared to when he was seen in the office last week; has cardiology through the [place]- due for follow-up there in the next month; is also in the process of establishing with pulmonology in the [place]; has underlying COPD and was treated for COPD exacerbation last week;   Objective:  Vitals:   10/31/19 1335  BP: (!) 160/70  Pulse: 70  Temp: 98 F (36.7 C)  TempSrc: Oral  SpO2: 96%  Weight: 220 lb 3.2 oz (99.9 kg)    General: Well developed, well nourished, in no acute distress  Skin : Warm and dry.  Head: Normocephalic and atraumatic  Lungs: Respirations slightly labored- per patient, he has no trouble breathing at home when he is not having to wear a mask;  CVS exam: normal rate and regular rhythm.  Extremities: 1+ pitting edema, no cyanosis, no clubbing  Vessels: Symmetric bilaterally  Neurologic: Alert and oriented; speech intact; face symmetrical; moves all extremities well; CNII-XII intact without focal deficit   Assessment:  1. Acute on chronic congestive heart failure, unspecified heart failure type (HCC)   2. Pedal edema     Plan:  Re-check CXR, CMP and BNP today; Rx for Lasix 20 mg bid prn for fluid/ edema; he is encouraged  to see his cardiologist at the [facility] in follow up; strict ER precautions discussed as well- he notes he will most likely go to the ER at [facility] if necessary.  This visit occurred during the SARS-CoV-2 public health emergency.  Safety protocols were in place, including screening questions prior to the visit, additional usage of staff PPE, and extensive cleaning of exam room while observing appropriate contact time as indicated for disinfecting solutions.     No follow-ups on file.  Orders Placed This Encounter  Procedures  . DG Chest 2 View    Standing Status:   Future    Number of Occurrences:   1    Standing Expiration Date:   12/30/2020    Order Specific Question:   Reason for Exam (SYMPTOM  OR DIAGNOSIS REQUIRED)    Answer:   pedal edema    Order Specific Question:   Preferred imaging location?    Answer:   [facility]    Order Specific Question:   Radiology Contrast Protocol - do NOT remove file path    Answer:   _0 [email or web address]  . B Nat Peptide  . Comp Met (CMET)    Requested Prescriptions   Signed Prescriptions Disp Refills  . benzonatate (TESSALON) 100 MG capsule 60 capsule 2    Sig: Take 1-2 capsules (100-200 mg total) by mouth 3 (three) times daily as needed for cough.  . furosemide (LASIX) 20 MG tablet 30 tablet 0    Sig: Take 1 tablet (20 mg total) by mouth 2 (two) times daily as needed for fluid or edema.

## 2019-10-31 NOTE — Telephone Encounter (Signed)
  New message    The patient son [place] is asking the NP to call him back to discuss his father's visit today.

## 2019-11-01 ENCOUNTER — Telehealth: Payer: Self-pay | Admitting: Internal Medicine

## 2019-11-01 DIAGNOSIS — I509 Heart failure, unspecified: Secondary | ICD-10-CM

## 2019-11-01 NOTE — Telephone Encounter (Signed)
Ok this is done 

## 2019-11-01 NOTE — Telephone Encounter (Signed)
    Son calling to request referral to [facility]: Dr Acie Fredrickson

## 2019-11-01 NOTE — Telephone Encounter (Signed)
Called  Pt/son no answer LMOM MD sent inhaler to [facility].Marland KitchenJohny [name]

## 2019-11-01 NOTE — Telephone Encounter (Signed)
Notified pt/son MD has placed the referral../lmb

## 2019-11-11 ENCOUNTER — Encounter: Payer: Self-pay | Admitting: Cardiology

## 2019-11-11 ENCOUNTER — Telehealth: Payer: Self-pay | Admitting: Cardiology

## 2019-11-11 ENCOUNTER — Telehealth (HOSPITAL_COMMUNITY): Payer: Self-pay | Admitting: *Deleted

## 2019-11-11 ENCOUNTER — Other Ambulatory Visit: Payer: Self-pay

## 2019-11-11 ENCOUNTER — Ambulatory Visit (INDEPENDENT_AMBULATORY_CARE_PROVIDER_SITE_OTHER): Payer: Medicare Other | Admitting: Cardiology

## 2019-11-11 VITALS — BP 134/64 | HR 67 | Ht 66.0 in | Wt 214.0 lb

## 2019-11-11 DIAGNOSIS — R0602 Shortness of breath: Secondary | ICD-10-CM | POA: Diagnosis not present

## 2019-11-11 DIAGNOSIS — I10 Essential (primary) hypertension: Secondary | ICD-10-CM

## 2019-11-11 DIAGNOSIS — Z8616 Personal history of COVID-19: Secondary | ICD-10-CM | POA: Diagnosis not present

## 2019-11-11 DIAGNOSIS — E782 Mixed hyperlipidemia: Secondary | ICD-10-CM

## 2019-11-11 DIAGNOSIS — R42 Dizziness and giddiness: Secondary | ICD-10-CM | POA: Diagnosis not present

## 2019-11-11 DIAGNOSIS — R079 Chest pain, unspecified: Secondary | ICD-10-CM

## 2019-11-11 DIAGNOSIS — E669 Obesity, unspecified: Secondary | ICD-10-CM

## 2019-11-11 MED ORDER — POTASSIUM CHLORIDE ER 10 MEQ PO TBCR: EXTENDED_RELEASE_TABLET | ORAL | 1 refills | Status: DC

## 2019-11-11 MED ORDER — FUROSEMIDE 20 MG PO TABS: ORAL_TABLET | ORAL | 1 refills | Status: DC

## 2019-11-11 NOTE — Progress Notes (Signed)
Cardiology Office Note:    Date:  11/12/2019   ID:  [name], DOB Jan 02, 1936, MRN [id number]  PCP:  Biagio Borg, MD  Cardiologist:  Berniece Salines, DO  Electrophysiologist:  None   Referring MD: Biagio Borg, MD   Chief Complaint  Patient presents with  . New Patient (Initial Visit)  Patient referred for concern of new onset heart failure.  History of Present Illness:    [name] is a 84 y.o. male with a hx of hypertension, hyperlipidemia, history of recent Covid infection, COPD presents today to be evaluated for worsening shortness of breath on exertion and chest pain.  The patient is here today with his daughter-in-law [name] due to poor memory.  They both tell me that over the last several months he has had worsening shortness of breath on exertion.  He used to be an active man but now he is unable to do daily activities due to this back.  In addition he has had bilateral leg edema he was recently started on Lasix by his PCP which seems to have helped tremendously.  At that visit he had blood work which showed that his BNP was 118.  As a result of this his PCP recommended patient see cardiology.  Addition he tells me that he has been experiencing intermittent left-sided chest pain which she described as sometimes pressure-like sensation or sharp sensation.  At times it radiates up his shoulder down his arms and sometimes not.  He is concerned about this because he was previously active and now health is deteriorating along with the symptoms.  No other complaints at this time.  Past Medical History:  Diagnosis Date  . Acute bronchitis per pt no fever--  cough since discharge from hospital 07-02-2015   per pcp note 07-05-2015  mild to moderate bronchitis vs pna  . Allergic rhinitis   . Bladder cancer ([place])   . BPH (benign prostatic hyperplasia)   . Bradycardia   . CAD (coronary artery disease)   . Chronic low back pain   . DDD (degenerative disc disease), lumbosacral   .  Depression   . Diverticulosis of colon (without mention of hemorrhage)   . Dyspnea   . Essential tremor   . Fatty liver disease, nonalcoholic   . GERD (gastroesophageal reflux disease)   . Glaucoma   . History of bladder cancer    2004--  TCC low-grade , non-invasive  . History of colon polyps   . History of exercise stress test    02-05-2007-- ETT (Clinically positive, electrically equivocal, submaximal ETT,  appropriate bp response to exercise  . History of hiatal hernia   . History of kidney stones   . History of peptic ulcer   . HLD (hyperlipidemia)   . HTN (hypertension)   . Nocturia   . Productive cough   . Rheumatoid arthritis(714.0)   . Right renal artery stenosis (H[facility])    per cath 03-14-2009 --- 40-50%  . RLS (restless legs syndrome)   . Vertigo, intermittent   . Weak urinary stream   . Wears dentures    upper  . Wears glasses   . Wears hearing aid    bilateral  . Wears partial dentures    lower    Past Surgical History:  Procedure Laterality Date  . CARDIAC CATHETERIZATION  02-12-2007  dr gregg taylor   Non-obstructive CAD,  20% pLCX,  20% LAD, ef 65%  . CARDIAC CATHETERIZATION  03-14-2009  dr Johnsie Cancel  pLAD and mid diaginal 20-30% multiple lesions,  OM1 20%, right renal ostial 40-50%,  ef 60%  . CATARACT EXTRACTION W/ INTRAOCULAR LENS  IMPLANT, BILATERAL  2016  . CYSTOSCOPY WITH BIOPSY N/A 07/11/2015   Procedure: CYSTOSCOPY WITH COLD CUP RESECTION AND FULGERATION;  Surgeon: Rana Snare, MD;  Location: [facility];  Service: Urology;  Laterality: N/A;  . HIP ARTHROSCOPY W/ LABRAL DEBRIDEMENT Left 06-29-2000   and chondraplasty  . LUMBAR [place] SURGERY  07-16-2005  . LUMBAR LAMINECTOMY/DECOMPRESSION MICRODISCECTOMY  07/09/2011   Procedure: LUMBAR LAMINECTOMY/DECOMPRESSION MICRODISCECTOMY;  Surgeon: Johnn Hai;  Location: [facility];  Service: Orthopedics;  Laterality: Left;  Decompression L5 - S1 on the Left and repair of dura  . LUMBAR  LAMINECTOMY/DECOMPRESSION MICRODISCECTOMY N/A 07/27/2013   Procedure: DECOMPRESSION L4 - L5 WITH EXCISION OF SYNOVIAL CYST AND, LATERAL MASS FUSION 1 LEVEL;  Surgeon: Johnn Hai, MD;  Location: [facility];  Service: Orthopedics;  Laterality: N/A;  . LUMBAR LAMINECTOMY/DECOMPRESSION MICRODISCECTOMY Left 07/06/2014   Procedure:  MICRO LUMBAR DECOMPRESSION L5-S1 ON LEFT, L4-5 REDO;  Surgeon: Johnn Hai, MD;  Location: [facility];  Service: Orthopedics;  Laterality: Left;  . SPINAL CORD STIMULATOR INSERTION N/A 09/11/2016   Procedure: Spinal cord stimulator placement;  Surgeon: Melina Schools, MD;  Location: [facility];  Service: Orthopedics;  Laterality: N/A;  . TONSILLECTOMY AND ADENOIDECTOMY    . TRANSTHORACIC ECHOCARDIOGRAM  09-20-2010   normal LVF, ef 55-65%/  mild MR, TR and PR/  mild LAE  . TRANSURETHRAL RESECTION OF BLADDER TUMOR  09-21-2002    Current Medications: Current Meds  Medication Sig  . acetaminophen (TYLENOL) 650 MG CR tablet Take 1,300 mg by mouth at bedtime as needed for pain.  Marland Kitchen albuterol (PROVENTIL) (2.5 MG/3ML) 0.083% nebulizer solution Take 3 mLs (2.5 mg total) by nebulization every 6 (six) hours as needed for wheezing or shortness of breath.  Marland Kitchen albuterol (VENTOLIN HFA) 108 (90 Base) MCG/ACT inhaler Inhale 2 puffs into the lungs every 6 (six) hours as needed for wheezing or shortness of breath.  Marland Kitchen amLODipine (NORVASC) 10 MG tablet TAKE 1 TABLET (10 MG TOTAL) DAILY.  Marland Kitchen ascorbic acid (VITAMIN C) 500 MG tablet Take 1 tablet (500 mg total) by mouth daily.  . brimonidine (ALPHAGAN) 0.15 % ophthalmic solution Place 1 drop into both eyes 2 (two) times daily.  . budesonide-formoterol (SYMBICORT) 160-4.5 MCG/ACT inhaler Inhale 2 puffs into the lungs 2 (two) times daily.  . cetirizine (ZYRTEC) 10 MG tablet Take 10 mg by mouth daily.  . citalopram (CELEXA) 10 MG tablet TAKE 1 TABLET EVERY MORNING  . clonazePAM (KLONOPIN) 0.5 MG tablet Take 1 tablet (0.5 mg total) by mouth 2 (two) times  daily as needed for anxiety.  . clotrimazole (LOTRIMIN) 1 % cream Apply 1 application topically 2 (two) times daily as needed (FOR RASH). Prescribed for rash in groin  . dextromethorphan-guaiFENesin (MUCINEX DM) 30-600 MG 12hr tablet Take 1 tablet by mouth 2 (two) times daily.  . fluticasone (FLONASE) 50 MCG/ACT nasal spray Place 2 sprays into both nostrils daily.  . furosemide (LASIX) 20 MG tablet Take one tablet daily for one week then take one table only on Tuesdays and Saturdays.  Marland Kitchen gabapentin (NEURONTIN) 300 MG capsule TAKE 1 TO 2 CAPSULES FOUR TIMES DAILY  AFTER MEALS AND AT BEDTIME. (Patient taking differently: Take 300 mg by mouth at bedtime. )  . hydrALAZINE (APRESOLINE) 10 MG tablet TAKE 1 TABLET THREE TIMES DAILY AS NEEDED FOR BLOOD PRESSURE MORE  THAN 150/90  . HYDROcodone-acetaminophen (NORCO/VICODIN) 5-325 MG tablet Take 1 tablet by mouth every 6 (six) hours as needed for severe pain.  . hydroxypropyl methylcellulose / hypromellose (ISOPTO TEARS / GONIOVISC) 2.5 % ophthalmic solution Place 1 drop into both eyes 2 (two) times daily.  . insulin aspart (NOVOLOG) 100 UNIT/ML FlexPen Inject 4 Units into the skin 3 (three) times daily with meals.  . Insulin Detemir (LEVEMIR) 100 UNIT/ML Pen Inject 5 Units into the skin 2 (two) times daily.  . Insulin Pen Needle 29G X 10MM MISC Use as directed  . ipratropium-albuterol (DUONEB) 0.5-2.5 (3) MG/3ML SOLN Take 3 mLs by nebulization every 6 (six) hours as needed.  . lidocaine (LIDODERM) 5 % Place 1 patch onto the skin daily. Remove & Discard patch within 12 hours or as directed by MD (Patient taking differently: Place 1 patch onto the skin daily as needed (back pain). Remove & Discard patch within 12 hours or as directed by MD)  . losartan (COZAAR) 100 MG tablet TAKE 1 TABLET EVERY DAY (Patient taking differently: Take 100 mg by mouth daily. )  . meclizine (ANTIVERT) 25 MG tablet TAKE 1 TABLET TWICE DAILY AS NEEDED FOR DIZZINESS (Patient taking  differently: Take 25 mg by mouth 2 (two) times daily as needed for dizziness. )  . methocarbamol (ROBAXIN) 500 MG tablet Take 500 mg by mouth 3 (three) times daily as needed for muscle spasms.  . methylPREDNISolone (MEDROL DOSEPAK) 4 MG TBPK tablet Use as directed on package  . nitroGLYCERIN (NITROSTAT) 0.4 MG SL tablet PLACE 1 TABLET UNDER THE TONGUE EVERY 5 MINUTES AS NEEDED FOR CHEST PAIN. MAX 3 DOSES. CALL 911 IF NO IMPROVEMENT AFTER 1ST DOSE (Patient taking differently: Place 0.4 mg under the tongue every 5 (five) minutes as needed for chest pain. )  . Nutritional Supplements (JUICE PLUS FIBRE PO) Take 1 each 2 (two) times daily by mouth.  Marland Kitchen omeprazole (PRILOSEC) 40 MG capsule TAKE 1 CAPSULE TWICE DAILY (Patient taking differently: Take 40 mg by mouth 2 (two) times daily. )  . ondansetron (ZOFRAN) 4 MG tablet Take 1 tablet (4 mg total) by mouth every 8 (eight) hours as needed for nausea or vomiting.  . primidone (MYSOLINE) 250 MG tablet TAKE 1/2 TABLET THREE TIMES DAILY  . psyllium (METAMUCIL SMOOTH TEXTURE) 58.6 % powder Take 2 tablespoons daily. Drink 12 oz of water  . simvastatin (ZOCOR) 40 MG tablet TAKE 1 AND 1/2 TABLETS AT BEDTIME  . tamsulosin (FLOMAX) 0.4 MG CAPS capsule Take 1 capsule (0.4 mg total) by mouth daily. (Patient taking differently: Take 0.8 mg by mouth at bedtime. )  . timolol (TIMOPTIC) 0.5 % ophthalmic solution Place 1 drop into both eyes 2 (two) times daily.   . Travoprost, BAK Free, (TRAVATAN Z) 0.004 % SOLN ophthalmic solution Place 1 drop into both eyes at bedtime.  . triamcinolone (KENALOG) 0.025 % cream Apply 1 application topically 2 (two) times daily as needed (rash).   . Vitamin D, Ergocalciferol, (DRISDOL) 1.25 MG (50000 UT) CAPS capsule Take 1 capsule (50,000 Units total) by mouth every 7 (seven) days.  Marland Kitchen zinc sulfate 220 (50 Zn) MG capsule Take 1 capsule (220 mg total) by mouth daily.  . [DISCONTINUED] furosemide (LASIX) 20 MG tablet Take 1 tablet (20 mg total)  by mouth 2 (two) times daily as needed for fluid or edema.     Allergies:   Morphine and related, Hydrochlorothiazide, Tape, Sulfur dioxide, Latex, Lisinopril, and Sulfa antibiotics   Social History  Socioeconomic History  . Marital status: Widowed    Spouse name: Not on file  . Number of children: 2  . Years of education: Not on file  . Highest education level: Not on file  Occupational History  . Occupation: retired from [employer]: RETIRED  Tobacco Use  . Smoking status: Former Smoker    Packs/day: 2.00    Years: 20.00    Pack years: 40.00    Types: Cigarettes    Quit date: 10/08/1982    Years since quitting: 37.1  . Smokeless tobacco: Never Used  Substance and Sexual Activity  . Alcohol use: Yes    Comment: rare wine or beer occassionally  . Drug use: No  . Sexual activity: Never  Other Topics Concern  . Not on file  Social History Narrative  . Not on file   Social Determinants of Health   Financial Resource Strain:   . Difficulty of Paying Living Expenses:   Food Insecurity:   . Worried About Charity fundraiser in the Last Year:   . Arboriculturist in the Last Year:   Transportation Needs:   . Film/video editor (Medical):   Marland Kitchen Lack of Transportation (Non-Medical):   Physical Activity:   . Days of Exercise per Week:   . Minutes of Exercise per Session:   Stress:   . Feeling of Stress :   Social Connections:   . Frequency of Communication with Friends and Family:   . Frequency of Social Gatherings with Friends and Family:   . Attends Religious Services:   . Active Member of Clubs or Organizations:   . Attends Archivist Meetings:   Marland Kitchen Marital Status:      Family History: The patient's family history includes Cancer in his brother, father, and sister; Diabetes in his sister; Heart attack in his mother.  ROS:   Review of Systems  Constitution: Negative for decreased appetite, fever and weight gain.  HENT: Negative for  congestion, ear discharge, hoarse voice and sore throat.   Eyes: Negative for discharge, redness, vision loss in right eye and visual halos.  Cardiovascular: Negative for chest pain, dyspnea on exertion, leg swelling, orthopnea and palpitations.  Respiratory: Negative for cough, hemoptysis, shortness of breath and snoring.   Endocrine: Negative for heat intolerance and polyphagia.  Hematologic/Lymphatic: Negative for bleeding problem. Does not bruise/bleed easily.  Skin: Negative for flushing, nail changes, rash and suspicious lesions.  Musculoskeletal: Negative for arthritis, joint pain, muscle cramps, myalgias, neck pain and stiffness.  Gastrointestinal: Negative for abdominal pain, bowel incontinence, diarrhea and excessive appetite.  Genitourinary: Negative for decreased libido, genital sores and incomplete emptying.  Neurological: Negative for brief paralysis, focal weakness, headaches and loss of balance.  Psychiatric/Behavioral: Negative for altered mental status, depression and suicidal ideas.  Allergic/Immunologic: Negative for HIV exposure and persistent infections.    EKGs/Labs/Other Studies Reviewed:    The following studies were reviewed today:   EKG:  The ekg ordered today demonstrates sinus rhythm heart rate 67 bpm with PAC.   TTE done in 2018 shows ef of 55-60%.   Recent Labs: 06/24/2019: TSH 3.78 08/21/2019: Magnesium 2.2 10/24/2019: B Natriuretic Peptide 92.6; Hemoglobin 12.6; Platelets 244 10/31/2019: ALT 41; BUN 18; Creatinine, Ser 0.82; Potassium 4.3; Pro B Natriuretic peptide (BNP) 118.0; Sodium 136  Recent Lipid Panel    Component Value Date/Time   CHOL 164 06/24/2019 1204   TRIG 201 (H) 08/16/2019 2200   HDL 48.80  06/24/2019 1204   CHOLHDL 3 06/24/2019 1204   VLDL 49.6 (H) 06/24/2019 1204   LDLCALC 65 10/14/2017 1047   LDLDIRECT 86.0 06/24/2019 1204    Physical Exam:    VS:  BP 134/64   Pulse 67   Ht 5\' 6"  (1.676 m)   Wt 214 lb (97.1 kg)   SpO2 94%    BMI 34.54 kg/m     Wt Readings from Last 3 Encounters:  11/11/19 214 lb (97.1 kg)  10/31/19 220 lb 3.2 oz (99.9 kg)  10/25/19 214 lb 9.6 oz (97.3 kg)     GEN: Well nourished, well developed in no acute distress HEENT: Normal NECK: No JVD; No carotid bruits LYMPHATICS: No lymphadenopathy CARDIAC: S1S2 noted,RRR, no murmurs, rubs, gallops RESPIRATORY:  Clear to auscultation without rales, wheezing or rhonchi  ABDOMEN: Soft, non-tender, non-distended, +bowel sounds, no guarding. EXTREMITIES: No edema, No cyanosis, no clubbing MUSCULOSKELETAL:  No deformity  SKIN: Warm and dry NEUROLOGIC:  Alert and oriented x 3, non-focal PSYCHIATRIC:  Normal affect, good insight  ASSESSMENT:    1. Chest pain of uncertain etiology   2. Dizziness   3. Shortness of breath   4. History of COVID-19   5. Mixed hyperlipidemia   6. Essential hypertension    PLAN:      I am concerned about her chest pain along with shortness of breath I am therefore going to do a further ischemic evaluation on this patient.  Is a great candidate for pharmacologic stress test after talking to patient and his daughter about this they are agreeable to the testing.   Due to concern for new onset heart failure based on clinical history, physical exam and elevated bnp, an echocardiogram will be ordered to assess LV/RV function and for any other structural abnormalities.   In terms of his dizziness and noted PACs a zio monitor will be placed on the patient for 7 days.   He has not been taking his Lasix, with his bilateral leg edema, I have asked the patient to take his Lasix 20 mg tuesdays and Saturday with potassium supplements.   Obesity - lifestyle modification and weight loss advised.  The patient is in agreement with the above plan. The patient left the office in stable condition.  The patient will follow up in   Medication Adjustments/Labs and Tests Ordered: Current medicines are reviewed at length with the  patient today.  Concerns regarding medicines are outlined above.  Orders Placed This Encounter  Procedures  . Basic metabolic panel  . Magnesium  . Ambulatory referral to Pulmonology  . LONG TERM MONITOR (3-14 DAYS)  . MYOCARDIAL PERFUSION IMAGING  . EKG 12-Lead  . ECHOCARDIOGRAM COMPLETE   Meds ordered this encounter  Medications  . furosemide (LASIX) 20 MG tablet    Sig: Take one tablet daily for one week then take one table only on Tuesdays and Saturdays.    Dispense:  30 tablet    Refill:  1  . potassium chloride (KLOR-CON) 10 MEQ tablet    Sig: Take one tablet daily for 1 week then take one tablet on on tuesdays and saturdays.    Dispense:  30 tablet    Refill:  1    Patient Instructions  Medication Instructions:  Your physician has recommended you make the following change in your medication:   START: Lasix 20 mg daily for 1 week then only 20 mg on Tuesdays and Saturdays.   START: Potassium 10 meq daily for 1 week  then only 10 meq on Tuesdays and Saturdays   *If you need a refill on your cardiac medications before your next appointment, please call your pharmacy*   Lab Work: Your physician recommends that you return for lab work today: bmp, mg   If you have labs (blood work) drawn today and your tests are completely normal, you will receive your results only by: Marland Kitchen MyChart Message (if you have MyChart) OR . A paper copy in the mail If you have any lab test that is abnormal or we need to change your treatment, we will call you to review the results.   Testing/Procedures: Your physician has requested that you have an echocardiogram. Echocardiography is a painless test that uses sound waves to create images of your heart. It provides your doctor with information about the size and shape of your heart and how well your heart's chambers and valves are working. This procedure takes approximately one hour. There are no restrictions for this procedure.  A zio monitor was  ordered today. It will remain on for 7 days. You will then return monitor and event diary in provided box. It takes 1-2 weeks for report to be downloaded and returned to [place]. We will call you with the results. If monitor falls off or has orange flashing light, please call Zio for further instructions.       [facility] [address] [place], [place] [address] Phone: [phone]    Please arrive 15 minutes prior to your appointment time for registration and insurance purposes.  The test will take approximately 3 to 4 hours to complete; you may bring reading material.  If someone comes with you to your appointment, they will need to remain in the main lobby due to limited space in the testing area. **If you are pregnant or breastfeeding, please notify the nuclear lab prior to your appointment**  How to prepare for your Myocardial Perfusion Test: . Do not eat or drink 3 hours prior to your test, except you may have water. . Do not consume products containing caffeine (regular or decaffeinated) 12 hours prior to your test. (ex: coffee, chocolate, sodas, tea). . Do bring a list of your current medications with you.  If not listed below, you may take your medications as normal. .  HOLD diabetic medication/insulin the morning of the test. . Do wear comfortable clothes (no dresses or overalls) and walking shoes, tennis shoes preferred (No heels or open toe shoes are allowed). . Do NOT wear cologne, perfume, aftershave, or lotions (deodorant is allowed). . If these instructions are not followed, your test will have to be rescheduled.  Please report to [address] for your test.  If you have questions or concerns about your appointment, you can call the Nuclear Lab at [phone].  If you cannot keep your appointment, please provide 24 hours notification to the Nuclear Lab, to avoid a possible $50 charge to your  account.    Follow-Up: At [facility], you and your health needs are our priority.  As part of our continuing mission to provide you with exceptional heart care, we have created designated Provider Care Teams.  These Care Teams include your primary Cardiologist (physician) and Advanced Practice Providers (APPs -  Physician Assistants and Nurse Practitioners) who all work together to provide you with the care you need, when you need it.  We recommend signing up for the patient portal called "MyChart".  Sign up information is provided on this After Visit Summary.  MyChart is used to connect with patients for Virtual Visits (Telemedicine).  Patients are able to view lab/test results, encounter notes, upcoming appointments, etc.  Non-urgent messages can be sent to your provider as well.   To learn more about what you can do with MyChart, go to [email or web address].    Your next appointment:   3 month(s)  The format for your next appointment:   In Person  Provider:   Berniece Salines, DO   Dr. Harriet Masson has referred you to pulmonology they should call you within 1 week if not please call our office.   Other Instructions   Cardiac Nuclear Scan A cardiac nuclear scan is a test that measures blood flow to the heart when a person is resting and when he or she is exercising. The test looks for problems such as:  Not enough blood reaching a portion of the heart.  The heart muscle not working normally. You may need this test if:  You have heart disease.  You have had abnormal lab results.  You have had heart surgery or a balloon procedure to open up blocked arteries (angioplasty).  You have chest pain.  You have shortness of breath. In this test, a radioactive dye (tracer) is injected into your bloodstream. After the tracer has traveled to your heart, an imaging device is used to measure how much of the tracer is absorbed by or distributed to various areas of your heart. This procedure is  usually done at a hospital and takes 2-4 hours. Tell a health care provider about:  Any allergies you have.  All medicines you are taking, including vitamins, herbs, eye drops, creams, and over-the-counter medicines.  Any problems you or family members have had with anesthetic medicines.  Any blood disorders you have.  Any surgeries you have had.  Any medical conditions you have.  Whether you are pregnant or may be pregnant. What are the risks? Generally, this is a safe procedure. However, problems may occur, including:  Serious chest pain and heart attack. This is only a risk if the stress portion of the test is done.  Rapid heartbeat.  Sensation of warmth in your chest. This usually passes quickly.  Allergic reaction to the tracer. What happens before the procedure?  Ask your health care provider about changing or stopping your regular medicines. This is especially important if you are taking diabetes medicines or blood thinners.  Follow instructions from your health care provider about eating or drinking restrictions.  Remove your jewelry on the day of the procedure. What happens during the procedure?  An IV will be inserted into one of your veins.  Your health care provider will inject a small amount of radioactive tracer through the IV.  You will wait for 20-40 minutes while the tracer travels through your bloodstream.  Your heart activity will be monitored with an electrocardiogram (ECG).  You will lie down on an exam table.  Images of your heart will be taken for about 15-20 minutes.  You may also have a stress test. For this test, one of the following may be done: ? You will exercise on a treadmill or stationary bike. While you exercise, your heart's activity will be monitored with an ECG, and your blood pressure will be checked. ? You will be given medicines that will increase blood flow to parts of your heart. This is done if you are unable to  exercise.  When blood  flow to your heart has peaked, a tracer will again be injected through the IV.  After 20-40 minutes, you will get back on the exam table and have more images taken of your heart.  Depending on the type of tracer used, scans may need to be repeated 3-4 hours later.  Your IV line will be removed when the procedure is over. The procedure may vary among health care providers and hospitals. What happens after the procedure?  Unless your health care provider tells you otherwise, you may return to your normal schedule, including diet, activities, and medicines.  Unless your health care provider tells you otherwise, you may increase your fluid intake. This will help to flush the contrast dye from your body. Drink enough fluid to keep your urine pale yellow.  Ask your health care provider, or the department that is doing the test: ? When will my results be ready? ? How will I get my results? Summary  A cardiac nuclear scan measures the blood flow to the heart when a person is resting and when he or she is exercising.  Tell your health care provider if you are pregnant.  Before the procedure, ask your health care provider about changing or stopping your regular medicines. This is especially important if you are taking diabetes medicines or blood thinners.  After the procedure, unless your health care provider tells you otherwise, increase your fluid intake. This will help flush the contrast dye from your body.  After the procedure, unless your health care provider tells you otherwise, you may return to your normal schedule, including diet, activities, and medicines. This information is not intended to replace advice given to you by your health care provider. Make sure you discuss any questions you have with your health care provider. Document Revised: 12/14/2017 Document Reviewed: 12/14/2017 Elsevier Patient Education  [place].   Echocardiogram An  echocardiogram is a procedure that uses painless sound waves (ultrasound) to produce an image of the heart. Images from an echocardiogram can provide important information about:  Signs of coronary artery disease (CAD).  Aneurysm detection. An aneurysm is a weak or damaged part of an artery wall that bulges out from the normal force of blood pumping through the body.  Heart size and shape. Changes in the size or shape of the heart can be associated with certain conditions, including heart failure, aneurysm, and CAD.  Heart muscle function.  Heart valve function.  Signs of a past heart attack.  Fluid buildup around the heart.  Thickening of the heart muscle.  A tumor or infectious growth around the heart valves. Tell a health care provider about:  Any allergies you have.  All medicines you are taking, including vitamins, herbs, eye drops, creams, and over-the-counter medicines.  Any blood disorders you have.  Any surgeries you have had.  Any medical conditions you have.  Whether you are pregnant or may be pregnant. What are the risks? Generally, this is a safe procedure. However, problems may occur, including:  Allergic reaction to dye (contrast) that may be used during the procedure. What happens before the procedure? No specific preparation is needed. You may eat and drink normally. What happens during the procedure?   An IV tube may be inserted into one of your veins.  You may receive contrast through this tube. A contrast is an injection that improves the quality of the pictures from your heart.  A gel will be applied to your chest.  A wand-like tool (transducer)  will be moved over your chest. The gel will help to transmit the sound waves from the transducer.  The sound waves will harmlessly bounce off of your heart to allow the heart images to be captured in real-time motion. The images will be recorded on a computer. The procedure may vary among health care  providers and hospitals. What happens after the procedure?  You may return to your normal, everyday life, including diet, activities, and medicines, unless your health care provider tells you not to do that. Summary  An echocardiogram is a procedure that uses painless sound waves (ultrasound) to produce an image of the heart.  Images from an echocardiogram can provide important information about the size and shape of your heart, heart muscle function, heart valve function, and fluid buildup around your heart.  You do not need to do anything to prepare before this procedure. You may eat and drink normally.  After the echocardiogram is completed, you may return to your normal, everyday life, unless your health care provider tells you not to do that. This information is not intended to replace advice given to you by your health care provider. Make sure you discuss any questions you have with your health care provider. Document Revised: 10/21/2018 Document Reviewed: 08/02/2016 Elsevier Patient Education  [place].     Adopting a Healthy Lifestyle.  Know what a healthy weight is for you (roughly BMI <25) and aim to maintain this   Aim for 7+ servings of fruits and vegetables daily   65-80+ fluid ounces of water or unsweet tea for healthy kidneys   Limit to max 1 drink of alcohol per day; avoid smoking/tobacco   Limit animal fats in diet for cholesterol and heart health - choose grass fed whenever available   Avoid highly processed foods, and foods high in saturated/trans fats   Aim for low stress - take time to unwind and care for your mental health   Aim for 150 min of moderate intensity exercise weekly for heart health, and weights twice weekly for bone health   Aim for 7-9 hours of sleep daily   When it comes to diets, agreement about the perfect plan isnt easy to find, even among the experts. Experts at the [employer] developed an idea known as  the Healthy Eating Plate. Just imagine a plate divided into logical, healthy portions.   The emphasis is on diet quality:   Load up on vegetables and fruits - one-half of your plate: Aim for color and variety, and remember that potatoes dont count.   Go for whole grains - one-quarter of your plate: Whole wheat, barley, wheat berries, quinoa, oats, brown rice, and foods made with them. If you want pasta, go with whole wheat pasta.   Protein power - one-quarter of your plate: Fish, chicken, beans, and nuts are all healthy, versatile protein sources. Limit red meat.   The diet, however, does go beyond the plate, offering a few other suggestions.   Use healthy plant oils, such as olive, canola, soy, corn, sunflower and peanut. Check the labels, and avoid partially hydrogenated oil, which have unhealthy trans fats.   If youre thirsty, drink water. Coffee and tea are good in moderation, but skip sugary drinks and limit milk and dairy products to one or two daily servings.   The type of carbohydrate in the diet is more important than the amount. Some sources of carbohydrates, such as vegetables, fruits, whole grains, and beans-are healthier  than others.   Finally, stay active  Signed, Berniece Salines, DO  11/12/2019 12:57 PM     [facility]

## 2019-11-11 NOTE — Telephone Encounter (Signed)
New Message  Patient's son is calling in to get approval to accompany the patient to his appointment. States that patient has issues with memory and would need someone there with him. Please call and confirm.

## 2019-11-11 NOTE — Telephone Encounter (Signed)
Spoke to patient son let him know he can accompany patient to appointment per Dr. Harriet Masson.

## 2019-11-11 NOTE — Patient Instructions (Addendum)
Medication Instructions:  Your physician has recommended you make the following change in your medication:   START: Lasix 20 mg daily for 1 week then only 20 mg on Tuesdays and Saturdays.   START: Potassium 10 meq daily for 1 week then only 10 meq on Tuesdays and Saturdays   *If you need a refill on your cardiac medications before your next appointment, please call your pharmacy*   Lab Work: Your physician recommends that you return for lab work today: bmp, mg   If you have labs (blood work) drawn today and your tests are completely normal, you will receive your results only by: Marland Kitchen MyChart Message (if you have MyChart) OR . A paper copy in the mail If you have any lab test that is abnormal or we need to change your treatment, we will call you to review the results.   Testing/Procedures: Your physician has requested that you have an echocardiogram. Echocardiography is a painless test that uses sound waves to create images of your heart. It provides your doctor with information about the size and shape of your heart and how well your heart's chambers and valves are working. This procedure takes approximately one hour. There are no restrictions for this procedure.  A zio monitor was ordered today. It will remain on for 7 days. You will then return monitor and event diary in provided box. It takes 1-2 weeks for report to be downloaded and returned to [place]. We will call you with the results. If monitor falls off or has orange flashing light, please call Zio for further instructions.       [facility] [address], [place], [place] [address] Phone: [phone]    Please arrive 15 minutes prior to your appointment time for registration and insurance purposes.  The test will take approximately 3 to 4 hours to complete; you may bring reading material.  If someone comes with you to your appointment, they will need to remain in the main  lobby due to limited space in the testing area. **If you are pregnant or breastfeeding, please notify the nuclear lab prior to your appointment**  How to prepare for your Myocardial Perfusion Test: . Do not eat or drink 3 hours prior to your test, except you may have water. . Do not consume products containing caffeine (regular or decaffeinated) 12 hours prior to your test. (ex: coffee, chocolate, sodas, tea). . Do bring a list of your current medications with you.  If not listed below, you may take your medications as normal. .  HOLD diabetic medication/insulin the morning of the test. . Do wear comfortable clothes (no dresses or overalls) and walking shoes, tennis shoes preferred (No heels or open toe shoes are allowed). . Do NOT wear cologne, perfume, aftershave, or lotions (deodorant is allowed). . If these instructions are not followed, your test will have to be rescheduled.  Please report to [address] for your test.  If you have questions or concerns about your appointment, you can call the Nuclear Lab at [phone].  If you cannot keep your appointment, please provide 24 hours notification to the Nuclear Lab, to avoid a possible $50 charge to your account.    Follow-Up: At [facility], you and your health needs are our priority.  As part of our continuing mission to provide you with exceptional heart care, we have created designated Provider Care Teams.  These Care Teams include your primary  Cardiologist (physician) and Advanced Practice Providers (APPs -  Physician Assistants and Nurse Practitioners) who all work together to provide you with the care you need, when you need it.  We recommend signing up for the patient portal called "MyChart".  Sign up information is provided on this After Visit Summary.  MyChart is used to connect with patients for Virtual Visits (Telemedicine).  Patients are able to view lab/test results, encounter notes, upcoming appointments,  etc.  Non-urgent messages can be sent to your provider as well.   To learn more about what you can do with MyChart, go to [email or web address].    Your next appointment:   3 month(s)  The format for your next appointment:   In Person  Provider:   Berniece Salines, DO   Dr. Harriet Masson has referred you to pulmonology they should call you within 1 week if not please call our office.   Other Instructions   Cardiac Nuclear Scan A cardiac nuclear scan is a test that measures blood flow to the heart when a person is resting and when he or she is exercising. The test looks for problems such as:  Not enough blood reaching a portion of the heart.  The heart muscle not working normally. You may need this test if:  You have heart disease.  You have had abnormal lab results.  You have had heart surgery or a balloon procedure to open up blocked arteries (angioplasty).  You have chest pain.  You have shortness of breath. In this test, a radioactive dye (tracer) is injected into your bloodstream. After the tracer has traveled to your heart, an imaging device is used to measure how much of the tracer is absorbed by or distributed to various areas of your heart. This procedure is usually done at a hospital and takes 2-4 hours. Tell a health care provider about:  Any allergies you have.  All medicines you are taking, including vitamins, herbs, eye drops, creams, and over-the-counter medicines.  Any problems you or family members have had with anesthetic medicines.  Any blood disorders you have.  Any surgeries you have had.  Any medical conditions you have.  Whether you are pregnant or may be pregnant. What are the risks? Generally, this is a safe procedure. However, problems may occur, including:  Serious chest pain and heart attack. This is only a risk if the stress portion of the test is done.  Rapid heartbeat.  Sensation of warmth in your chest. This usually passes  quickly.  Allergic reaction to the tracer. What happens before the procedure?  Ask your health care provider about changing or stopping your regular medicines. This is especially important if you are taking diabetes medicines or blood thinners.  Follow instructions from your health care provider about eating or drinking restrictions.  Remove your jewelry on the day of the procedure. What happens during the procedure?  An IV will be inserted into one of your veins.  Your health care provider will inject a small amount of radioactive tracer through the IV.  You will wait for 20-40 minutes while the tracer travels through your bloodstream.  Your heart activity will be monitored with an electrocardiogram (ECG).  You will lie down on an exam table.  Images of your heart will be taken for about 15-20 minutes.  You may also have a stress test. For this test, one of the following may be done: ? You will exercise on a treadmill or stationary bike. While you exercise, your heart's  activity will be monitored with an ECG, and your blood pressure will be checked. ? You will be given medicines that will increase blood flow to parts of your heart. This is done if you are unable to exercise.  When blood flow to your heart has peaked, a tracer will again be injected through the IV.  After 20-40 minutes, you will get back on the exam table and have more images taken of your heart.  Depending on the type of tracer used, scans may need to be repeated 3-4 hours later.  Your IV line will be removed when the procedure is over. The procedure may vary among health care providers and hospitals. What happens after the procedure?  Unless your health care provider tells you otherwise, you may return to your normal schedule, including diet, activities, and medicines.  Unless your health care provider tells you otherwise, you may increase your fluid intake. This will help to flush the contrast dye from your  body. Drink enough fluid to keep your urine pale yellow.  Ask your health care provider, or the department that is doing the test: ? When will my results be ready? ? How will I get my results? Summary  A cardiac nuclear scan measures the blood flow to the heart when a person is resting and when he or she is exercising.  Tell your health care provider if you are pregnant.  Before the procedure, ask your health care provider about changing or stopping your regular medicines. This is especially important if you are taking diabetes medicines or blood thinners.  After the procedure, unless your health care provider tells you otherwise, increase your fluid intake. This will help flush the contrast dye from your body.  After the procedure, unless your health care provider tells you otherwise, you may return to your normal schedule, including diet, activities, and medicines. This information is not intended to replace advice given to you by your health care provider. Make sure you discuss any questions you have with your health care provider. Document Revised: 12/14/2017 Document Reviewed: 12/14/2017 Elsevier Patient Education  [place].   Echocardiogram An echocardiogram is a procedure that uses painless sound waves (ultrasound) to produce an image of the heart. Images from an echocardiogram can provide important information about:  Signs of coronary artery disease (CAD).  Aneurysm detection. An aneurysm is a weak or damaged part of an artery wall that bulges out from the normal force of blood pumping through the body.  Heart size and shape. Changes in the size or shape of the heart can be associated with certain conditions, including heart failure, aneurysm, and CAD.  Heart muscle function.  Heart valve function.  Signs of a past heart attack.  Fluid buildup around the heart.  Thickening of the heart muscle.  A tumor or infectious growth around the heart valves. Tell a  health care provider about:  Any allergies you have.  All medicines you are taking, including vitamins, herbs, eye drops, creams, and over-the-counter medicines.  Any blood disorders you have.  Any surgeries you have had.  Any medical conditions you have.  Whether you are pregnant or may be pregnant. What are the risks? Generally, this is a safe procedure. However, problems may occur, including:  Allergic reaction to dye (contrast) that may be used during the procedure. What happens before the procedure? No specific preparation is needed. You may eat and drink normally. What happens during the procedure?   An IV tube may be  inserted into one of your veins.  You may receive contrast through this tube. A contrast is an injection that improves the quality of the pictures from your heart.  A gel will be applied to your chest.  A wand-like tool (transducer) will be moved over your chest. The gel will help to transmit the sound waves from the transducer.  The sound waves will harmlessly bounce off of your heart to allow the heart images to be captured in real-time motion. The images will be recorded on a computer. The procedure may vary among health care providers and hospitals. What happens after the procedure?  You may return to your normal, everyday life, including diet, activities, and medicines, unless your health care provider tells you not to do that. Summary  An echocardiogram is a procedure that uses painless sound waves (ultrasound) to produce an image of the heart.  Images from an echocardiogram can provide important information about the size and shape of your heart, heart muscle function, heart valve function, and fluid buildup around your heart.  You do not need to do anything to prepare before this procedure. You may eat and drink normally.  After the echocardiogram is completed, you may return to your normal, everyday life, unless your health care provider tells  you not to do that. This information is not intended to replace advice given to you by your health care provider. Make sure you discuss any questions you have with your health care provider. Document Revised: 10/21/2018 Document Reviewed: 08/02/2016 Elsevier Patient Education  [place].

## 2019-11-11 NOTE — Telephone Encounter (Signed)
Patient given detailed instructions per Myocardial Perfusion Study Information Sheet for the test on 11/15/19 at 7:15. Patient notified to arrive 15 minutes early and that it is imperative to arrive on time for appointment to keep from having the test rescheduled.  If you need to cancel or reschedule your appointment, please call the office within 24 hours of your appointment. Patient verbalized understanding.[name]

## 2019-11-12 DIAGNOSIS — R42 Dizziness and giddiness: Secondary | ICD-10-CM | POA: Insufficient documentation

## 2019-11-12 DIAGNOSIS — R079 Chest pain, unspecified: Secondary | ICD-10-CM | POA: Insufficient documentation

## 2019-11-12 DIAGNOSIS — I10 Essential (primary) hypertension: Secondary | ICD-10-CM | POA: Insufficient documentation

## 2019-11-12 DIAGNOSIS — E669 Obesity, unspecified: Secondary | ICD-10-CM | POA: Insufficient documentation

## 2019-11-15 ENCOUNTER — Telehealth: Payer: Self-pay

## 2019-11-15 ENCOUNTER — Ambulatory Visit (HOSPITAL_COMMUNITY): Payer: Medicare Other | Attending: Cardiology

## 2019-11-15 ENCOUNTER — Other Ambulatory Visit: Payer: Self-pay

## 2019-11-15 VITALS — Ht 66.0 in | Wt 214.0 lb

## 2019-11-15 DIAGNOSIS — R079 Chest pain, unspecified: Secondary | ICD-10-CM | POA: Insufficient documentation

## 2019-11-15 DIAGNOSIS — R42 Dizziness and giddiness: Secondary | ICD-10-CM | POA: Insufficient documentation

## 2019-11-15 DIAGNOSIS — R0602 Shortness of breath: Secondary | ICD-10-CM | POA: Insufficient documentation

## 2019-11-15 LAB — MYOCARDIAL PERFUSION IMAGING
LV dias vol: 73 mL (ref 62–150)
LV sys vol: 29 mL
Peak HR: 71 {beats}/min
Rest HR: 65 {beats}/min
SDS: 0
SRS: 0
SSS: 0
TID: 1.04

## 2019-11-15 MED ORDER — TECHNETIUM TC 99M TETROFOSMIN IV KIT
10.2000 | PACK | Freq: Once | INTRAVENOUS | Status: AC | PRN
  Administered 2019-11-15: 07:00:00 10.2 via INTRAVENOUS
  Filled 2019-11-15: qty 11

## 2019-11-15 MED ORDER — TECHNETIUM TC 99M TETROFOSMIN IV KIT
32.6000 | PACK | Freq: Once | INTRAVENOUS | Status: AC | PRN
  Administered 2019-11-15: 32.6 via INTRAVENOUS
  Filled 2019-11-15: qty 33

## 2019-11-15 MED ORDER — REGADENOSON 0.4 MG/5ML IV SOLN
0.4000 mg | Freq: Once | INTRAVENOUS | Status: AC
  Administered 2019-11-15: 09:00:00 0.4 mg via INTRAVENOUS

## 2019-11-15 NOTE — Telephone Encounter (Signed)
Spoke with patient regarding results and recommendation.  Patient verbalizes understanding and is agreeable to plan of care. Advised patient to call back with any issues or concerns.  

## 2019-11-15 NOTE — Telephone Encounter (Signed)
-----   Message from Berniece Salines, DO sent at 11/15/2019  3:19 PM EDT ----- Normal stress test please notify patient.  Please forward copy to PCP.

## 2019-11-17 ENCOUNTER — Ambulatory Visit (HOSPITAL_BASED_OUTPATIENT_CLINIC_OR_DEPARTMENT_OTHER)
Admission: RE | Admit: 2019-11-17 | Discharge: 2019-11-17 | Disposition: A | Discharge status: Home / Self Care | Payer: Medicare Other | Source: Ambulatory Visit | Attending: Cardiology | Admitting: Cardiology

## 2019-11-17 ENCOUNTER — Other Ambulatory Visit: Payer: Self-pay

## 2019-11-17 DIAGNOSIS — R079 Chest pain, unspecified: Secondary | ICD-10-CM | POA: Insufficient documentation

## 2019-11-17 DIAGNOSIS — R0602 Shortness of breath: Secondary | ICD-10-CM | POA: Diagnosis not present

## 2019-11-17 DIAGNOSIS — R42 Dizziness and giddiness: Secondary | ICD-10-CM | POA: Diagnosis not present

## 2019-11-17 NOTE — Progress Notes (Signed)
  Echocardiogram 2D Echocardiogram has been performed.  [name] 11/17/2019, 10:39 AM

## 2019-11-18 ENCOUNTER — Telehealth: Payer: Self-pay

## 2019-11-18 ENCOUNTER — Telehealth: Payer: Self-pay | Admitting: Cardiology

## 2019-11-18 NOTE — Telephone Encounter (Signed)
Spoke with patient regarding results and recommendation.  Patient verbalizes understanding and is agreeable to plan of care. Advised patient to call back with any issues or concerns.  

## 2019-11-18 NOTE — Telephone Encounter (Signed)
Spoke with patients daughter regarding results and recommendation.  She verbalizes understanding and is agreeable to plan of care. Advised patient/daughter to call back with any issues or concerns.  

## 2019-11-18 NOTE — Telephone Encounter (Signed)
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  For now there is nothing to do.  I will repeat imaging in 1 year for the ascending aorta.  We will discuss in more detail at the next visit.

## 2019-11-18 NOTE — Telephone Encounter (Signed)
Daughter in law is calling about his test results.  She states that he can't remember what was said to him. She would like nurse to give her a call.

## 2019-12-02 DIAGNOSIS — M4856XA Collapsed vertebra, not elsewhere classified, lumbar region, initial encounter for fracture: Secondary | ICD-10-CM | POA: Diagnosis not present

## 2019-12-02 DIAGNOSIS — M5416 Radiculopathy, lumbar region: Secondary | ICD-10-CM | POA: Diagnosis not present

## 2019-12-13 ENCOUNTER — Ambulatory Visit: Payer: Medicare Other

## 2019-12-15 ENCOUNTER — Institutional Professional Consult (permissible substitution): Payer: Medicare Other | Admitting: Critical Care Medicine

## 2019-12-15 ENCOUNTER — Other Ambulatory Visit: Payer: Self-pay

## 2019-12-16 ENCOUNTER — Other Ambulatory Visit: Payer: Self-pay | Admitting: Specialist

## 2019-12-16 DIAGNOSIS — R42 Dizziness and giddiness: Secondary | ICD-10-CM | POA: Diagnosis not present

## 2019-12-16 DIAGNOSIS — M545 Low back pain, unspecified: Secondary | ICD-10-CM | POA: Insufficient documentation

## 2019-12-16 DIAGNOSIS — S32000A Wedge compression fracture of unspecified lumbar vertebra, initial encounter for closed fracture: Secondary | ICD-10-CM | POA: Insufficient documentation

## 2019-12-16 DIAGNOSIS — R0602 Shortness of breath: Secondary | ICD-10-CM | POA: Diagnosis not present

## 2019-12-16 DIAGNOSIS — R079 Chest pain, unspecified: Secondary | ICD-10-CM | POA: Diagnosis not present

## 2019-12-16 DIAGNOSIS — M5136 Other intervertebral disc degeneration, lumbar region: Secondary | ICD-10-CM | POA: Diagnosis not present

## 2019-12-16 DIAGNOSIS — S32009D Unspecified fracture of unspecified lumbar vertebra, subsequent encounter for fracture with routine healing: Secondary | ICD-10-CM | POA: Diagnosis not present

## 2019-12-16 DIAGNOSIS — M810 Age-related osteoporosis without current pathological fracture: Secondary | ICD-10-CM | POA: Diagnosis not present

## 2019-12-17 LAB — BASIC METABOLIC PANEL
BUN/Creatinine Ratio: 10 (ref 10–24)
BUN: 12 mg/dL (ref 8–27)
CO2: 24 mmol/L (ref 20–29)
Calcium: 9.2 mg/dL (ref 8.6–10.2)
Chloride: 104 mmol/L (ref 96–106)
Creatinine, Ser: 1.19 mg/dL (ref 0.76–1.27)
GFR calc Af Amer: 65 mL/min/{1.73_m2} (ref >59)
GFR calc non Af Amer: 57 mL/min/{1.73_m2} — ABNORMAL LOW (ref >59)
Glucose: 166 mg/dL — ABNORMAL HIGH (ref 65–99)
Potassium: 4 mmol/L (ref 3.5–5.2)
Sodium: 144 mmol/L (ref 134–144)

## 2019-12-17 LAB — MAGNESIUM: Magnesium: 2 mg/dL (ref 1.6–2.3)

## 2019-12-30 ENCOUNTER — Ambulatory Visit: Payer: Medicare Other | Admitting: Internal Medicine

## 2019-12-30 ENCOUNTER — Ambulatory Visit: Payer: Medicare Other

## 2020-01-02 ENCOUNTER — Ambulatory Visit (INDEPENDENT_AMBULATORY_CARE_PROVIDER_SITE_OTHER): Payer: Medicare Other

## 2020-01-02 ENCOUNTER — Other Ambulatory Visit: Payer: Self-pay

## 2020-01-02 VITALS — BP 130/70 | HR 60 | Temp 97.8°F | Resp 16 | Ht 66.0 in | Wt 217.0 lb

## 2020-01-02 DIAGNOSIS — Z0000 Encounter for general adult medical examination without abnormal findings: Secondary | ICD-10-CM | POA: Diagnosis not present

## 2020-01-02 NOTE — Patient Instructions (Addendum)
[name] , Thank you for taking time to come for your Medicare Wellness Visit. I appreciate your ongoing commitment to your health goals. Please review the following plan we discussed and let me know if I can assist you in the future.   Screening recommendations/referrals: Colonoscopy: no repeat due to age Recommended yearly ophthalmology/optometry visit for glaucoma screening and checkup Recommended yearly dental visit for hygiene and checkup  Vaccinations: Influenza vaccine: due 02/12/2020; due every year Pneumococcal vaccine: completed Tdap vaccine: due 11/28/2019; due every 10 years Shingles vaccine: completed (done at [facility]) Covid-19: completed; will bring card to next appt  Advanced directives: Please bring a copy of your health care power of attorney and living will to the office at your convenience.  Conditions/risks identified: Please continue to do your personal lifestyle choices by: daily care of teeth and gums, regular physical activity (goal should be 5 days a week for 30 minutes), eat a healthy diet, avoid tobacco and drug use, limiting any alcohol intake, taking a low-dose aspirin (if not allergic or have been advised by your provider otherwise) and taking vitamins and minerals as recommended by your provider. Continue doing brain stimulating activities (puzzles, reading, adult coloring books, staying active) to keep memory sharp. Continue to eat heart healthy diet (full of fruits, vegetables, whole grains, lean protein, water--limit salt, fat, and sugar intake) and increase physical activity as tolerated.  Next appointment: Please schedule your next Medicare Wellness Visit with your Nurse Health Advisor in 1 year.  Preventive Care 42 Years and Older, Male Preventive care refers to lifestyle choices and visits with your health care provider that can promote health and wellness. What does preventive care include?  A yearly physical exam. This is also called an  annual well check.  Dental exams once or twice a year.  Routine eye exams. Ask your health care provider how often you should have your eyes checked.  Personal lifestyle choices, including:  Daily care of your teeth and gums.  Regular physical activity.  Eating a healthy diet.  Avoiding tobacco and drug use.  Limiting alcohol use.  Practicing safe sex.  Taking low doses of aspirin every day.  Taking vitamin and mineral supplements as recommended by your health care provider. What happens during an annual well check? The services and screenings done by your health care provider during your annual well check will depend on your age, overall health, lifestyle risk factors, and family history of disease. Counseling  Your health care provider may ask you questions about your:  Alcohol use.  Tobacco use.  Drug use.  Emotional well-being.  Home and relationship well-being.  Sexual activity.  Eating habits.  History of falls.  Memory and ability to understand (cognition).  Work and work Statistician. Screening  You may have the following tests or measurements:  Height, weight, and BMI.  Blood pressure.  Lipid and cholesterol levels. These may be checked every 5 years, or more frequently if you are over 70 years old.  Skin check.  Lung cancer screening. You may have this screening every year starting at age 44 if you have a 30-pack-year history of smoking and currently smoke or have quit within the past 15 years.  Fecal occult blood test (FOBT) of the stool. You may have this test every year starting at age 36.  Flexible sigmoidoscopy or colonoscopy. You may have a sigmoidoscopy every 5 years or a colonoscopy every 10 years starting at age 20.  Prostate cancer screening. Recommendations will vary  depending on your family history and other risks.  Hepatitis C blood test.  Hepatitis B blood test.  Sexually transmitted disease (STD) testing.  Diabetes  screening. This is done by checking your blood sugar (glucose) after you have not eaten for a while (fasting). You may have this done every 1-3 years.  Abdominal aortic aneurysm (AAA) screening. You may need this if you are a current or former smoker.  Osteoporosis. You may be screened starting at age 62 if you are at high risk. Talk with your health care provider about your test results, treatment options, and if necessary, the need for more tests. Vaccines  Your health care provider may recommend certain vaccines, such as:  Influenza vaccine. This is recommended every year.  Tetanus, diphtheria, and acellular pertussis (Tdap, Td) vaccine. You may need a Td booster every 10 years.  Zoster vaccine. You may need this after age 45.  Pneumococcal 13-valent conjugate (PCV13) vaccine. One dose is recommended after age 46.  Pneumococcal polysaccharide (PPSV23) vaccine. One dose is recommended after age 21. Talk to your health care provider about which screenings and vaccines you need and how often you need them. This information is not intended to replace advice given to you by your health care provider. Make sure you discuss any questions you have with your health care provider. Document Released: 07/27/2015 Document Revised: 03/19/2016 Document Reviewed: 05/01/2015 Elsevier Interactive Patient Education  2017 [place] Prevention in the Home Falls can cause injuries. They can happen to people of all ages. There are many things you can do to make your home safe and to help prevent falls. What can I do on the outside of my home?  Regularly fix the edges of walkways and driveways and fix any cracks.  Remove anything that might make you trip as you walk through a door, such as a raised step or threshold.  Trim any bushes or trees on the path to your home.  Use bright outdoor lighting.  Clear any walking paths of anything that might make someone trip, such as rocks or  tools.  Regularly check to see if handrails are loose or broken. Make sure that both sides of any steps have handrails.  Any raised decks and porches should have guardrails on the edges.  Have any leaves, snow, or ice cleared regularly.  Use sand or salt on walking paths during winter.  Clean up any spills in your garage right away. This includes oil or grease spills. What can I do in the bathroom?  Use night lights.  Install grab bars by the toilet and in the tub and shower. Do not use towel bars as grab bars.  Use non-skid mats or decals in the tub or shower.  If you need to sit down in the shower, use a plastic, non-slip stool.  Keep the floor dry. Clean up any water that spills on the floor as soon as it happens.  Remove soap buildup in the tub or shower regularly.  Attach bath mats securely with double-sided non-slip rug tape.  Do not have throw rugs and other things on the floor that can make you trip. What can I do in the bedroom?  Use night lights.  Make sure that you have a light by your bed that is easy to reach.  Do not use any sheets or blankets that are too big for your bed. They should not hang down onto the floor.  Have a firm chair that has side  arms. You can use this for support while you get dressed.  Do not have throw rugs and other things on the floor that can make you trip. What can I do in the kitchen?  Clean up any spills right away.  Avoid walking on wet floors.  Keep items that you use a lot in easy-to-reach places.  If you need to reach something above you, use a strong step stool that has a grab bar.  Keep electrical cords out of the way.  Do not use floor polish or wax that makes floors slippery. If you must use wax, use non-skid floor wax.  Do not have throw rugs and other things on the floor that can make you trip. What can I do with my stairs?  Do not leave any items on the stairs.  Make sure that there are handrails on both  sides of the stairs and use them. Fix handrails that are broken or loose. Make sure that handrails are as long as the stairways.  Check any carpeting to make sure that it is firmly attached to the stairs. Fix any carpet that is loose or worn.  Avoid having throw rugs at the top or bottom of the stairs. If you do have throw rugs, attach them to the floor with carpet tape.  Make sure that you have a light switch at the top of the stairs and the bottom of the stairs. If you do not have them, ask someone to add them for you. What else can I do to help prevent falls?  Wear shoes that:  Do not have high heels.  Have rubber bottoms.  Are comfortable and fit you well.  Are closed at the toe. Do not wear sandals.  If you use a stepladder:  Make sure that it is fully opened. Do not climb a closed stepladder.  Make sure that both sides of the stepladder are locked into place.  Ask someone to hold it for you, if possible.  Clearly mark and make sure that you can see:  Any grab bars or handrails.  First and last steps.  Where the edge of each step is.  Use tools that help you move around (mobility aids) if they are needed. These include:  Canes.  Walkers.  Scooters.  Crutches.  Turn on the lights when you go into a dark area. Replace any light bulbs as soon as they burn out.  Set up your furniture so you have a clear path. Avoid moving your furniture around.  If any of your floors are uneven, fix them.  If there are any pets around you, be aware of where they are.  Review your medicines with your doctor. Some medicines can make you feel dizzy. This can increase your chance of falling. Ask your doctor what other things that you can do to help prevent falls. This information is not intended to replace advice given to you by your health care provider. Make sure you discuss any questions you have with your health care provider. Document Released: 04/26/2009 Document Revised:  12/06/2015 Document Reviewed: 08/04/2014 Elsevier Interactive Patient Education  2017 [employer].

## 2020-01-02 NOTE — Progress Notes (Signed)
Subjective:   [name] is a 84 y.o. male who presents for Medicare Annual/Subsequent preventive examination.  Review of Systems    No ROS. Medicare Wellness Visit Cardiac Risk Factors include: advanced age (>84men, >108 women);dyslipidemia;family history of premature cardiovascular disease;hypertension;male gender;obesity (BMI >30kg/m2);Other (see comment), Risk factor comments: alcohol     Objective:    Today's Vitals   01/02/20 1217  BP: 130/70  Pulse: 60  Resp: 16  Temp: 97.8 F (36.6 C)  SpO2: 94%  Weight: 217 lb (98.4 kg)  Height: 5\' 6"  (1.676 m)  PainSc: 7   PainLoc: Back   Body mass index is 35.02 kg/m.  Advanced Directives 01/02/2020 10/24/2019 08/17/2019 07/22/2019 02/24/2019 02/15/2019 12/22/2018  Does Patient Have a Medical Advance Directive? Yes Yes No No No No Yes  Type of Advance Directive - - - - - - Press photographer;Living will  Does patient want to make changes to medical advance directive? No - Patient declined - - - - - -  Copy of Press photographer in Chart? - - - - - - No - copy requested  Would patient like information on creating a medical advance directive? - - No - Patient declined - Yes (ED - Information included in AVS) Yes (ED - Information included in AVS) -  Pre-existing out of facility DNR order (yellow form or pink MOST form) - - - - - - -    Current Medications (verified) Outpatient Encounter Medications as of 01/02/2020  Medication Sig   acetaminophen (TYLENOL) 650 MG CR tablet Take 1,300 mg by mouth at bedtime as needed for pain.   albuterol (PROVENTIL) (2.5 MG/3ML) 0.083% nebulizer solution Take 3 mLs (2.5 mg total) by nebulization every 6 (six) hours as needed for wheezing or shortness of breath.   albuterol (VENTOLIN HFA) 108 (90 Base) MCG/ACT inhaler Inhale 2 puffs into the lungs every 6 (six) hours as needed for wheezing or shortness of breath.   amLODipine (NORVASC) 10 MG tablet TAKE 1 TABLET (10 MG TOTAL)  DAILY.   ascorbic acid (VITAMIN C) 500 MG tablet Take 1 tablet (500 mg total) by mouth daily.   benzonatate (TESSALON) 100 MG capsule Take 1-2 capsules (100-200 mg total) by mouth 3 (three) times daily as needed for cough. (Patient not taking: Reported on 11/11/2019)   brimonidine (ALPHAGAN) 0.15 % ophthalmic solution Place 1 drop into both eyes 2 (two) times daily.   budesonide-formoterol (SYMBICORT) 160-4.5 MCG/ACT inhaler Inhale 2 puffs into the lungs 2 (two) times daily.   cetirizine (ZYRTEC) 10 MG tablet Take 10 mg by mouth daily.   citalopram (CELEXA) 10 MG tablet TAKE 1 TABLET EVERY MORNING   clonazePAM (KLONOPIN) 0.5 MG tablet Take 1 tablet (0.5 mg total) by mouth 2 (two) times daily as needed for anxiety.   clotrimazole (LOTRIMIN) 1 % cream Apply 1 application topically 2 (two) times daily as needed (FOR RASH). Prescribed for rash in groin   dextromethorphan-guaiFENesin (MUCINEX DM) 30-600 MG 12hr tablet Take 1 tablet by mouth 2 (two) times daily.   fluticasone (FLONASE) 50 MCG/ACT nasal spray Place 2 sprays into both nostrils daily.   furosemide (LASIX) 20 MG tablet Take one tablet daily for one week then take one table only on Tuesdays and Saturdays.   gabapentin (NEURONTIN) 300 MG capsule TAKE 1 TO 2 CAPSULES FOUR TIMES DAILY  AFTER MEALS AND AT BEDTIME. (Patient taking differently: Take 300 mg by mouth at bedtime. )   hydrALAZINE (APRESOLINE)  10 MG tablet TAKE 1 TABLET THREE TIMES DAILY AS NEEDED FOR BLOOD PRESSURE MORE THAN 150/90   HYDROcodone-acetaminophen (NORCO/VICODIN) 5-325 MG tablet Take 1 tablet by mouth every 6 (six) hours as needed for severe pain.   hydroxypropyl methylcellulose / hypromellose (ISOPTO TEARS / GONIOVISC) 2.5 % ophthalmic solution Place 1 drop into both eyes 2 (two) times daily.   insulin aspart (NOVOLOG) 100 UNIT/ML FlexPen Inject 4 Units into the skin 3 (three) times daily with meals.   Insulin Detemir (LEVEMIR) 100 UNIT/ML Pen Inject 5 Units  into the skin 2 (two) times daily.   Insulin Pen Needle 29G X 10MM MISC Use as directed   ipratropium-albuterol (DUONEB) 0.5-2.5 (3) MG/3ML SOLN Take 3 mLs by nebulization every 6 (six) hours as needed.   lidocaine (LIDODERM) 5 % Place 1 patch onto the skin daily. Remove & Discard patch within 12 hours or as directed by MD (Patient taking differently: Place 1 patch onto the skin daily as needed (back pain). Remove & Discard patch within 12 hours or as directed by MD)   losartan (COZAAR) 100 MG tablet TAKE 1 TABLET EVERY DAY (Patient taking differently: Take 100 mg by mouth daily. )   meclizine (ANTIVERT) 25 MG tablet TAKE 1 TABLET TWICE DAILY AS NEEDED FOR DIZZINESS (Patient taking differently: Take 25 mg by mouth 2 (two) times daily as needed for dizziness. )   methocarbamol (ROBAXIN) 500 MG tablet Take 500 mg by mouth 3 (three) times daily as needed for muscle spasms.   methylPREDNISolone (MEDROL DOSEPAK) 4 MG TBPK tablet Use as directed on package   nitroGLYCERIN (NITROSTAT) 0.4 MG SL tablet PLACE 1 TABLET UNDER THE TONGUE EVERY 5 MINUTES AS NEEDED FOR CHEST PAIN. MAX 3 DOSES. CALL 911 IF NO IMPROVEMENT AFTER 1ST DOSE (Patient taking differently: Place 0.4 mg under the tongue every 5 (five) minutes as needed for chest pain. )   Nutritional Supplements (JUICE PLUS FIBRE PO) Take 1 each 2 (two) times daily by mouth.   omeprazole (PRILOSEC) 40 MG capsule TAKE 1 CAPSULE TWICE DAILY (Patient taking differently: Take 40 mg by mouth 2 (two) times daily. )   ondansetron (ZOFRAN) 4 MG tablet Take 1 tablet (4 mg total) by mouth every 8 (eight) hours as needed for nausea or vomiting.   potassium chloride (KLOR-CON) 10 MEQ tablet Take one tablet daily for 1 week then take one tablet on on tuesdays and saturdays.   primidone (MYSOLINE) 250 MG tablet TAKE 1/2 TABLET THREE TIMES DAILY   psyllium (METAMUCIL SMOOTH TEXTURE) 58.6 % powder Take 2 tablespoons daily. Drink 12 oz of water   simvastatin  (ZOCOR) 40 MG tablet TAKE 1 AND 1/2 TABLETS AT BEDTIME   tamsulosin (FLOMAX) 0.4 MG CAPS capsule Take 1 capsule (0.4 mg total) by mouth daily. (Patient taking differently: Take 0.8 mg by mouth at bedtime. )   timolol (TIMOPTIC) 0.5 % ophthalmic solution Place 1 drop into both eyes 2 (two) times daily.    Travoprost, BAK Free, (TRAVATAN Z) 0.004 % SOLN ophthalmic solution Place 1 drop into both eyes at bedtime.   triamcinolone (KENALOG) 0.025 % cream Apply 1 application topically 2 (two) times daily as needed (rash).    Vitamin D, Ergocalciferol, (DRISDOL) 1.25 MG (50000 UT) CAPS capsule Take 1 capsule (50,000 Units total) by mouth every 7 (seven) days.   zinc sulfate 220 (50 Zn) MG capsule Take 1 capsule (220 mg total) by mouth daily.   No facility-administered encounter medications on file as of  01/02/2020.    Allergies (verified) Morphine and related, Hydrochlorothiazide, Tape, Sulfur dioxide, Latex, Lisinopril, and Sulfa antibiotics   History: Past Medical History:  Diagnosis Date   Acute bronchitis per pt no fever--  cough since discharge from hospital 07-02-2015   per pcp note 07-05-2015  mild to moderate bronchitis vs pna   Allergic rhinitis    Bladder cancer (HCC)    BPH (benign prostatic hyperplasia)    Bradycardia    CAD (coronary artery disease)    Chronic low back pain    DDD (degenerative disc disease), lumbosacral    Depression    Diverticulosis of colon (without mention of hemorrhage)    Dyspnea    Essential tremor    Fatty liver disease, nonalcoholic    GERD (gastroesophageal reflux disease)    Glaucoma    History of bladder cancer    2004--  TCC low-grade , non-invasive   History of colon polyps    History of exercise stress test    02-05-2007-- ETT (Clinically positive, electrically equivocal, submaximal ETT,  appropriate bp response to exercise   History of hiatal hernia    History of kidney stones    History of peptic ulcer    HLD  (hyperlipidemia)    HTN (hypertension)    Nocturia    Productive cough    Rheumatoid arthritis(714.0)    Right renal artery stenosis ([place])    per cath 03-14-2009 --- 40-50%   RLS (restless legs syndrome)    Vertigo, intermittent    Weak urinary stream    Wears dentures    upper   Wears glasses    Wears hearing aid    bilateral   Wears partial dentures    lower   Past Surgical History:  Procedure Laterality Date   CARDIAC CATHETERIZATION  02-12-2007  dr gregg taylor   Non-obstructive CAD,  20% pLCX,  20% LAD, ef 65%   CARDIAC CATHETERIZATION  03-14-2009  dr Johnsie Cancel   pLAD and mid diaginal 20-30% multiple lesions,  OM1 20%, right renal ostial 40-50%,  ef 60%   CATARACT EXTRACTION W/ INTRAOCULAR LENS  IMPLANT, BILATERAL  2016   CYSTOSCOPY WITH BIOPSY N/A 07/11/2015   Procedure: CYSTOSCOPY WITH COLD CUP RESECTION AND FULGERATION;  Surgeon: Rana Snare, MD;  Location: [facility];  Service: Urology;  Laterality: N/A;   HIP ARTHROSCOPY W/ LABRAL DEBRIDEMENT Left 06-29-2000   and chondraplasty   LUMBAR DISC SURGERY  07-16-2005   LUMBAR LAMINECTOMY/DECOMPRESSION MICRODISCECTOMY  07/09/2011   Procedure: LUMBAR LAMINECTOMY/DECOMPRESSION MICRODISCECTOMY;  Surgeon: Johnn Hai;  Location: [facility];  Service: Orthopedics;  Laterality: Left;  Decompression L5 - S1 on the Left and repair of dura   LUMBAR LAMINECTOMY/DECOMPRESSION MICRODISCECTOMY N/A 07/27/2013   Procedure: DECOMPRESSION L4 - L5 WITH EXCISION OF SYNOVIAL CYST AND, LATERAL MASS FUSION 1 LEVEL;  Surgeon: Johnn Hai, MD;  Location: [facility];  Service: Orthopedics;  Laterality: N/A;   LUMBAR LAMINECTOMY/DECOMPRESSION MICRODISCECTOMY Left 07/06/2014   Procedure:  MICRO LUMBAR DECOMPRESSION L5-S1 ON LEFT, L4-5 REDO;  Surgeon: Johnn Hai, MD;  Location: [facility];  Service: Orthopedics;  Laterality: Left;   SPINAL CORD STIMULATOR INSERTION N/A 09/11/2016   Procedure: Spinal cord stimulator  placement;  Surgeon: Melina Schools, MD;  Location: [facility];  Service: Orthopedics;  Laterality: N/A;   TONSILLECTOMY AND ADENOIDECTOMY     TRANSTHORACIC ECHOCARDIOGRAM  09-20-2010   normal LVF, ef 55-65%/  mild MR, TR and PR/  mild LAE   TRANSURETHRAL RESECTION  OF BLADDER TUMOR  09-21-2002   Family History  Problem Relation Age of Onset   Heart attack Mother    Cancer Father        lung   Cancer Sister        lung   Diabetes Sister    Cancer Brother        lung that spread to brain   Social History   Socioeconomic History   Marital status: Widowed    Spouse name: Not on file   Number of children: 2   Years of education: Not on file   Highest education level: Not on file  Occupational History   Occupation: retired from [employer]: RETIRED  Tobacco Use   Smoking status: Former Smoker    Packs/day: 2.00    Years: 20.00    Pack years: 40.00    Types: Cigarettes    Quit date: 10/08/1982    Years since quitting: 37.2   Smokeless tobacco: Never Used  Vaping Use   Vaping Use: Never used  Substance and Sexual Activity   Alcohol use: Yes    Comment: rare wine or beer occassionally   Drug use: No   Sexual activity: Never  Other Topics Concern   Not on file  Social History Narrative   Not on file   Social Determinants of Health   Financial Resource Strain:    Difficulty of Paying Living Expenses:   Food Insecurity:    Worried About Charity fundraiser in the Last Year:    Arboriculturist in the Last Year:   Transportation Needs:    Film/video editor (Medical):    Lack of Transportation (Non-Medical):   Physical Activity:    Days of Exercise per Week:    Minutes of Exercise per Session:   Stress:    Feeling of Stress :   Social Connections:    Frequency of Communication with Friends and Family:    Frequency of Social Gatherings with Friends and Family:    Attends Religious Services:    Active Member of Clubs or  Organizations:    Attends Music therapist:    Marital Status:     Tobacco Counseling Counseling given: No   Clinical Intake:  Pre-visit preparation completed: Yes  Pain : 0-10 Pain Score: 7  Pain Type: Chronic pain Pain Location: Back Pain Orientation: Lower Pain Radiating Towards: bilateral legs Pain Descriptors / Indicators: Discomfort, Aching, Constant, Dull Pain Onset: More than a month ago Pain Frequency: Constant     BMI - recorded: 35.02 Nutritional Status: BMI > 30  Obese Nutritional Risks: Nausea/ vomitting/ diarrhea Diabetes: No  How often do you need to have someone help you when you read instructions, pamphlets, or other written materials from your doctor or pharmacy?: 1 - Never What is the last grade level you completed in school?: HS[employer]; Retired Nature conservation officer  Diabetic? no  Interpreter Needed?: No  Information entered by :: [employer]. Elgene Coral, LPN   Activities of Daily Living In your present state of health, do you have any difficulty performing the following activities: 01/02/2020 08/17/2019  Hearing? Y N  Comment wears hearing aids -  Vision? N N  Difficulty concentrating or making decisions? Y N  Walking or climbing stairs? Y N  Dressing or bathing? N N  Doing errands, shopping? Y N  Comment son or daughter-in-law drives patient to ov or pharmacy -  Conservation officer, nature and eating ? N -  Using the Toilet? N -  In the past six months, have you accidently leaked urine? N -  Do you have problems with loss of bowel control? N -  Managing your Medications? N -  Managing your Finances? N -  Housekeeping or managing your Housekeeping? N -  Some recent data might be hidden    Patient Care Team: Biagio Borg, MD as PCP - General Berniece Salines, DO as PCP - Cardiology (Cardiology)  Indicate any recent Medical Services you may have received from other than [facility] providers in the past year (date may be approximate).     Assessment:   This is a  routine wellness examination for [place].  Hearing/Vision screen No exam data present  Dietary issues and exercise activities discussed: Current Exercise Habits: The patient does not participate in regular exercise at present, Exercise limited by: respiratory conditions(s);orthopedic condition(s);neurologic condition(s);cardiac condition(s);psychological condition(s)  Goals     Patient Stated     Stay as active and as independent as possible. Continue to do exercises to maintain strength and balance. Enjoy life and family.     Stay as socially active as possible, continue to exercise, and eat healthy      Depression Screen PHQ 2/9 Scores 01/02/2020 10/03/2019 12/22/2018 12/14/2017 10/14/2017 12/12/2016 11/12/2016  PHQ - 2 Score 0 0 1 1 0 1 0  PHQ- 9 Score - - 2 3 - - -    Fall Risk Fall Risk  01/02/2020 10/03/2019 12/22/2018 12/14/2017 10/14/2017  Falls in the past year? 1 0 1 Yes Yes  Comment - - - - -  Number falls in past yr: 0 0 1 2 or more 2 or more  Injury with Fall? 0 0 1 No No  Comment - - - - now s/p right toe surgury - no further falls  Risk Factor Category  - - - High Fall Risk -  Risk for fall due to : Impaired balance/gait - History of fall(s);Impaired balance/gait;Impaired mobility - -  Follow up Falls evaluation completed;Education provided - Falls prevention discussed Falls prevention discussed;Education provided -    Any stairs in or around the home? No  If so, are there any without handrails? No  Home free of loose throw rugs in walkways, pet beds, electrical cords, etc? Yes  Adequate lighting in your home to reduce risk of falls? Yes   ASSISTIVE DEVICES UTILIZED TO PREVENT FALLS:  Life alert? Yes  Use of a cane, walker or w/c? Yes  Grab bars in the bathroom? No  Shower chair or bench in shower? Yes  Elevated toilet seat or a handicapped toilet? Yes   TIMED UP AND GO:  Was the test performed? Yes .  Length of time to ambulate 10 feet: 20 sec.   Gait steady and fast  with assistive device  Cognitive Function: MMSE - Mini Mental State Exam 12/14/2017  Orientation to time 5  Orientation to Place 5  Registration 3  Attention/ Calculation 3  Recall 3  Language- name 2 objects 2  Language- repeat 1  Language- follow 3 step command 3  Language- read & follow direction 1  Write a sentence 1  Copy design 1  Total score 28        Immunizations Immunization History  Administered Date(s) Administered   Influenza Split 03/14/2012   Influenza Whole 05/17/2007, 04/19/2013   Influenza, High Dose Seasonal PF 04/14/2017, 04/20/2018   Influenza,inj,Quad PF,6+ Mos 04/05/2014, 06/04/2016   Influenza,inj,quad, With Preservative 04/12/2018   Influenza-Unspecified  05/14/2015   Pneumococcal Conjugate-13 05/16/2014   Pneumococcal Polysaccharide-23 05/17/2007   Tdap 11/28/2010   Zoster 10/25/2013    TDAP status: Up to date Flu Vaccine status: Up to date Pneumococcal vaccine status: Up to date Covid-19 vaccine status: Completed vaccines  Qualifies for Shingles Vaccine? Yes   Zostavax completed Yes   Shingrix Completed?: Yes  Screening Tests Health Maintenance  Topic Date Due   COVID-19 Vaccine (1) Never done   INFLUENZA VACCINE  02/12/2020   TETANUS/TDAP  11/27/2020   PNA vac Low Risk Adult  Completed    Health Maintenance  Health Maintenance Due  Topic Date Due   COVID-19 Vaccine (1) Never done    Colorectal cancer screening: No longer required.   Lung Cancer Screening: (Low Dose CT Chest recommended if Age 83-80 years, 30 pack-year currently smoking OR have quit w/in 15years.) does not qualify.   Lung Cancer Screening Referral: No  Additional Screening:  Hepatitis C Screening: does not qualify; Completed never done  Vision Screening: Recommended annual ophthalmology exams for early detection of glaucoma and other disorders of the eye. Is the patient up to date with their annual eye exam?  Yes  Who is the provider or  what is the name of the office in which the patient attends annual eye exams? [facility] at [place], [place] If pt is not established with a provider, would they like to be referred to a provider to establish care? No .   Dental Screening: Recommended annual dental exams for proper oral hygiene  Community Resource Referral / Chronic Care Management: CRR required this visit?  No   CCM required this visit?  No      Plan:      I have personally reviewed and noted the following in the patients chart:    Medical and social history  Use of alcohol, tobacco or illicit drugs   Current medications and supplements  Functional ability and status  Nutritional status  Physical activity  Advanced directives  List of other physicians  Hospitalizations, surgeries, and ER visits in previous 12 months  Vitals  Screenings to include cognitive, depression, and falls  Referrals and appointments  In addition, I have reviewed and discussed with patient certain preventive protocols, quality metrics, and best practice recommendations. A written personalized care plan for preventive services as well as general preventive health recommendations were provided to patient.     Sheral Flow, LPN   08/03/9756   Nurse Notes:  Cognitive function not indicated; Patient is cogitatively intact.

## 2020-01-03 ENCOUNTER — Ambulatory Visit
Admission: RE | Admit: 2020-01-03 | Discharge: 2020-01-03 | Disposition: A | Discharge status: Home / Self Care | Payer: Medicare Other | Source: Ambulatory Visit | Attending: Specialist | Admitting: Specialist

## 2020-01-03 DIAGNOSIS — M545 Low back pain, unspecified: Secondary | ICD-10-CM

## 2020-01-04 ENCOUNTER — Other Ambulatory Visit: Payer: Self-pay | Admitting: Specialist

## 2020-01-04 DIAGNOSIS — R5381 Other malaise: Secondary | ICD-10-CM

## 2020-01-09 ENCOUNTER — Other Ambulatory Visit: Payer: Self-pay

## 2020-01-09 ENCOUNTER — Ambulatory Visit (INDEPENDENT_AMBULATORY_CARE_PROVIDER_SITE_OTHER): Payer: Medicare Other | Admitting: Podiatry

## 2020-01-09 ENCOUNTER — Encounter: Payer: Self-pay | Admitting: Podiatry

## 2020-01-09 DIAGNOSIS — B351 Tinea unguium: Secondary | ICD-10-CM

## 2020-01-09 DIAGNOSIS — M79676 Pain in unspecified toe(s): Secondary | ICD-10-CM | POA: Diagnosis not present

## 2020-01-14 NOTE — Progress Notes (Signed)
Subjective:  Patient ID: [name], male    DOB: August 25, 1935,  MRN: [id number]  84 y.o. male presents with painful thick toenails that are difficult to trim. Pain interferes with ambulation. Aggravating factors include wearing enclosed shoe gear. Pain is relieved with periodic professional debridement..    He states he has a lumbar spinal fracture. He was told if he has another fall, he may not walk again.  Review of Systems: Negative except as noted in the HPI. Denies N/V/F/Ch.  Past Medical History:  Diagnosis Date  . Acute bronchitis per pt no fever--  cough since discharge from hospital 07-02-2015   per pcp note 07-05-2015  mild to moderate bronchitis vs pna  . Allergic rhinitis   . Bladder cancer ([place])   . BPH (benign prostatic hyperplasia)   . Bradycardia   . CAD (coronary artery disease)   . Chronic low back pain   . DDD (degenerative disc disease), lumbosacral   . Depression   . Diverticulosis of colon (without mention of hemorrhage)   . Dyspnea   . Essential tremor   . Fatty liver disease, nonalcoholic   . GERD (gastroesophageal reflux disease)   . Glaucoma   . History of bladder cancer    2004--  TCC low-grade , non-invasive  . History of colon polyps   . History of exercise stress test    02-05-2007-- ETT (Clinically positive, electrically equivocal, submaximal ETT,  appropriate bp response to exercise  . History of hiatal hernia   . History of kidney stones   . History of peptic ulcer   . HLD (hyperlipidemia)   . HTN (hypertension)   . Nocturia   . Productive cough   . Rheumatoid arthritis(714.0)   . Right renal artery stenosis (HCC)    per cath 03-14-2009 --- 40-50%  . RLS (restless legs syndrome)   . Vertigo, intermittent   . Weak urinary stream   . Wears dentures    upper  . Wears glasses   . Wears hearing aid    bilateral  . Wears partial dentures    lower   Past Surgical History:  Procedure Laterality Date  . CARDIAC CATHETERIZATION   02-12-2007  dr gregg taylor   Non-obstructive CAD,  20% pLCX,  20% LAD, ef 65%  . CARDIAC CATHETERIZATION  03-14-2009  dr Johnsie Cancel   pLAD and mid diaginal 20-30% multiple lesions,  OM1 20%, right renal ostial 40-50%,  ef 60%  . CATARACT EXTRACTION W/ INTRAOCULAR LENS  IMPLANT, BILATERAL  2016  . CYSTOSCOPY WITH BIOPSY N/A 07/11/2015   Procedure: CYSTOSCOPY WITH COLD CUP RESECTION AND FULGERATION;  Surgeon: Rana Snare, MD;  Location: [facility];  Service: Urology;  Laterality: N/A;  . HIP ARTHROSCOPY W/ LABRAL DEBRIDEMENT Left 06-29-2000   and chondraplasty  . LUMBAR [place] SURGERY  07-16-2005  . LUMBAR LAMINECTOMY/DECOMPRESSION MICRODISCECTOMY  07/09/2011   Procedure: LUMBAR LAMINECTOMY/DECOMPRESSION MICRODISCECTOMY;  Surgeon: Johnn Hai;  Location: [facility];  Service: Orthopedics;  Laterality: Left;  Decompression L5 - S1 on the Left and repair of dura  . LUMBAR LAMINECTOMY/DECOMPRESSION MICRODISCECTOMY N/A 07/27/2013   Procedure: DECOMPRESSION L4 - L5 WITH EXCISION OF SYNOVIAL CYST AND, LATERAL MASS FUSION 1 LEVEL;  Surgeon: Johnn Hai, MD;  Location: [facility];  Service: Orthopedics;  Laterality: N/A;  . LUMBAR LAMINECTOMY/DECOMPRESSION MICRODISCECTOMY Left 07/06/2014   Procedure:  MICRO LUMBAR DECOMPRESSION L5-S1 ON LEFT, L4-5 REDO;  Surgeon: Johnn Hai, MD;  Location: [facility];  Service: Orthopedics;  Laterality: Left;  . SPINAL CORD STIMULATOR INSERTION N/A 09/11/2016   Procedure: Spinal cord stimulator placement;  Surgeon: Melina Schools, MD;  Location: [place];  Service: Orthopedics;  Laterality: N/A;  . TONSILLECTOMY AND ADENOIDECTOMY    . TRANSTHORACIC ECHOCARDIOGRAM  09-20-2010   normal LVF, ef 55-65%/  mild MR, TR and PR/  mild LAE  . TRANSURETHRAL RESECTION OF BLADDER TUMOR  09-21-2002   Patient Active Problem List   Diagnosis Date Noted  . Chest pain of uncertain etiology 76/28/3151  . Essential hypertension 11/12/2019  . Dizziness 11/12/2019  . Obesity (BMI  30-39.9) 11/12/2019  . History of COVID-19 10/03/2019  . Acute respiratory failure with hypoxia ([place]) 10/03/2019  . Pneumonia due to COVID-19 virus 08/16/2019  . Acute respiratory failure due to COVID-19 ([place]) 08/16/2019  . COVID-19 virus infection 08/09/2019  . Memory loss 06/24/2019  . Wheezing 06/24/2019  . Abrasion of left arm 04/11/2019  . Joint swelling of lower leg 11/29/2018  . Abnormal urinalysis 02/25/2018  . Anxiety 01/08/2018  . Laceration of hand 12/18/2017  . Pain of left hand 12/18/2017  . Laceration of scalp without foreign body 06/09/2017  . Nonintractable headache 06/09/2017  . Acute sinus infection 04/14/2017  . Nausea and vomiting 03/27/2017  . Hyponatremia 03/17/2017  . Right lumbar radiculopathy 01/21/2017  . Fall 01/21/2017  . Synovial cyst 01/21/2017  . Chronic pain 09/11/2016  . Generalized weakness 05/22/2016  . UTI (urinary tract infection) 05/22/2016  . Weakness 05/22/2016  . Fever blister 10/18/2015  . Dyspnea 10/17/2015  . Bradycardia 09/17/2015  . Chest pain at rest 09/17/2015  . CAD in native artery 09/17/2015  . CHF (congestive heart failure) ([place]) 09/17/2015  . Pulmonary hypertension ([place]) 09/17/2015  . Bladder cancer ([place]) 09/17/2015  . Essential tremor 09/17/2015  . Chest pain 09/17/2015  . Anginal chest pain at rest [facility]) 09/17/2015  . Hypokalemia 07/13/2015  . Vertigo 07/01/2015  . Dizzinesses 07/01/2015  . Spinal stenosis of lumbar region at multiple levels 07/06/2014  . Angular cheilitis 05/05/2014  . Left lumbar radiculopathy 05/04/2014  . Right elbow pain 12/29/2013  . Lateral epicondylitis of right elbow 12/29/2013  . Cough 09/14/2013  . Spinal stenosis, lumbar 07/27/2013  . Palpable mass of neck 07/21/2013  . Vasovagal episode 01/10/2013  . Left otitis media 10/28/2012  . Impaired glucose tolerance 10/28/2012  . Cellulitis 04/24/2012  . Hemorrhoids 04/24/2012  . Syncope 04/21/2012  . Preventative health care 11/28/2010    . Depression 09/12/2010  . RENAL ARTERY STENOSIS 09/12/2010  . FATIGUE 09/12/2010  . BUNION, RIGHT FOOT 02/05/2010  . RECTAL BLEEDING 07/10/2009  . Diarrhea 07/10/2009  . DIVERTICULOSIS OF COLON 03/28/2009  . FATTY LIVER DISEASE 03/28/2009  . BRADYCARDIA 03/20/2009  . DIZZINESS 10/17/2008  . CHEST PAIN 10/17/2008  . Malignant neoplasm of bladder ([place]) 02/07/2008  . Allergic rhinitis 02/07/2008  . COLONIC POLYPS, HX OF 02/07/2008  . Hyperlipidemia 11/24/2007  . GERD 11/24/2007  . HIATAL HERNIA WITH REFLUX 11/24/2007  . CKD (chronic kidney disease) stage 3, GFR 30-59 ml/min 11/24/2007  . Rheumatoid arthritis ([place]) 11/24/2007  . [place] DISEASE, LUMBAR 11/24/2007  . ADENOIDECTOMY, HX OF 11/24/2007    Current Outpatient Medications:  .  acetaminophen (TYLENOL) 650 MG CR tablet, Take 1,300 mg by mouth at bedtime as needed for pain., Disp: , Rfl:  .  albuterol (PROVENTIL) (2.5 MG/3ML) 0.083% nebulizer solution, Take 3 mLs (2.5 mg total) by nebulization every 6 (six) hours as needed for wheezing or shortness of  breath., Disp: 150 mL, Rfl: 1 .  albuterol (VENTOLIN HFA) 108 (90 Base) MCG/ACT inhaler, Inhale 2 puffs into the lungs every 6 (six) hours as needed for wheezing or shortness of breath., Disp: 18 g, Rfl: 11 .  amLODipine (NORVASC) 10 MG tablet, TAKE 1 TABLET (10 MG TOTAL) DAILY., Disp: 90 tablet, Rfl: 1 .  ascorbic acid (VITAMIN C) 500 MG tablet, Take 1 tablet (500 mg total) by mouth daily., Disp: 30 tablet, Rfl: 0 .  benzonatate (TESSALON) 100 MG capsule, Take 1-2 capsules (100-200 mg total) by mouth 3 (three) times daily as needed for cough. (Patient not taking: Reported on 11/11/2019), Disp: 60 capsule, Rfl: 2 .  brimonidine (ALPHAGAN) 0.15 % ophthalmic solution, Place 1 drop into both eyes 2 (two) times daily., Disp: , Rfl:  .  budesonide-formoterol (SYMBICORT) 160-4.5 MCG/ACT inhaler, Inhale 2 puffs into the lungs 2 (two) times daily., Disp: 1 Inhaler, Rfl: 3 .  cetirizine (ZYRTEC)  10 MG tablet, Take 10 mg by mouth daily., Disp: , Rfl:  .  citalopram (CELEXA) 10 MG tablet, TAKE 1 TABLET EVERY MORNING, Disp: 90 tablet, Rfl: 1 .  clonazePAM (KLONOPIN) 0.5 MG tablet, Take 1 tablet (0.5 mg total) by mouth 2 (two) times daily as needed for anxiety., Disp: 60 tablet, Rfl: 1 .  clotrimazole (LOTRIMIN) 1 % cream, Apply 1 application topically 2 (two) times daily as needed (FOR RASH). Prescribed for rash in groin, Disp: , Rfl:  .  dextromethorphan-guaiFENesin (MUCINEX DM) 30-600 MG 12hr tablet, Take 1 tablet by mouth 2 (two) times daily., Disp: , Rfl:  .  diazepam (VALIUM) 5 MG tablet, diazepam 5 mg tablet, Disp: , Rfl:  .  fluticasone (FLONASE) 50 MCG/ACT nasal spray, Place 2 sprays into both nostrils daily., Disp: 48 g, Rfl: 1 .  furosemide (LASIX) 20 MG tablet, Take one tablet daily for one week then take one table only on Tuesdays and Saturdays., Disp: 30 tablet, Rfl: 1 .  gabapentin (NEURONTIN) 300 MG capsule, TAKE 1 TO 2 CAPSULES FOUR TIMES DAILY  AFTER MEALS AND AT BEDTIME. (Patient taking differently: Take 300 mg by mouth at bedtime. ), Disp: 720 capsule, Rfl: 3 .  hydrALAZINE (APRESOLINE) 10 MG tablet, TAKE 1 TABLET THREE TIMES DAILY AS NEEDED FOR BLOOD PRESSURE MORE THAN 150/90, Disp: 270 tablet, Rfl: 0 .  HYDROcodone-acetaminophen (NORCO/VICODIN) 5-325 MG tablet, Take 1 tablet by mouth every 6 (six) hours as needed for severe pain., Disp: 10 tablet, Rfl: 0 .  hydroxypropyl methylcellulose / hypromellose (ISOPTO TEARS / GONIOVISC) 2.5 % ophthalmic solution, Place 1 drop into both eyes 2 (two) times daily., Disp: , Rfl:  .  insulin aspart (NOVOLOG) 100 UNIT/ML FlexPen, Inject 4 Units into the skin 3 (three) times daily with meals., Disp: 6 mL, Rfl: 0 .  Insulin Detemir (LEVEMIR) 100 UNIT/ML Pen, Inject 5 Units into the skin 2 (two) times daily., Disp: 6 mL, Rfl: 0 .  Insulin Pen Needle 29G X 10MM MISC, Use as directed, Disp: 200 each, Rfl: 0 .  ipratropium-albuterol (DUONEB)  0.5-2.5 (3) MG/3ML SOLN, Take 3 mLs by nebulization every 6 (six) hours as needed., Disp: 360 mL, Rfl: 0 .  lidocaine (LIDODERM) 5 %, Place 1 patch onto the skin daily. Remove & Discard patch within 12 hours or as directed by MD (Patient taking differently: Place 1 patch onto the skin daily as needed (back pain). Remove & Discard patch within 12 hours or as directed by MD), Disp: 30 patch, Rfl: 0 .  losartan (COZAAR) 100 MG tablet, TAKE 1 TABLET EVERY DAY (Patient taking differently: Take 100 mg by mouth daily. ), Disp: 90 tablet, Rfl: 2 .  meclizine (ANTIVERT) 25 MG tablet, TAKE 1 TABLET TWICE DAILY AS NEEDED FOR DIZZINESS (Patient taking differently: Take 25 mg by mouth 2 (two) times daily as needed for dizziness. ), Disp: 180 tablet, Rfl: 1 .  methocarbamol (ROBAXIN) 500 MG tablet, Take 500 mg by mouth 3 (three) times daily as needed for muscle spasms., Disp: , Rfl:  .  methylPREDNISolone (MEDROL DOSEPAK) 4 MG TBPK tablet, Use as directed on package, Disp: 21 tablet, Rfl: 0 .  nitroGLYCERIN (NITROSTAT) 0.4 MG SL tablet, PLACE 1 TABLET UNDER THE TONGUE EVERY 5 MINUTES AS NEEDED FOR CHEST PAIN. MAX 3 DOSES. CALL 911 IF NO IMPROVEMENT AFTER 1ST DOSE (Patient taking differently: Place 0.4 mg under the tongue every 5 (five) minutes as needed for chest pain. ), Disp: 25 tablet, Rfl: 0 .  Nutritional Supplements (JUICE PLUS FIBRE PO), Take 1 each 2 (two) times daily by mouth., Disp: , Rfl:  .  omeprazole (PRILOSEC) 40 MG capsule, TAKE 1 CAPSULE TWICE DAILY (Patient taking differently: Take 40 mg by mouth 2 (two) times daily. ), Disp: 180 capsule, Rfl: 3 .  ondansetron (ZOFRAN) 4 MG tablet, Take 1 tablet (4 mg total) by mouth every 8 (eight) hours as needed for nausea or vomiting., Disp: 30 tablet, Rfl: 2 .  potassium chloride (KLOR-CON) 10 MEQ tablet, Take one tablet daily for 1 week then take one tablet on on tuesdays and saturdays., Disp: 30 tablet, Rfl: 1 .  predniSONE (DELTASONE) 20 MG tablet, prednisone  20 mg tablet  TAKE 2 TABLETS BY MOUTH ONCE DAILY WITH BREAKFAST FOR 5 DAYS, Disp: , Rfl:  .  primidone (MYSOLINE) 250 MG tablet, TAKE 1/2 TABLET THREE TIMES DAILY, Disp: 135 tablet, Rfl: 1 .  psyllium (METAMUCIL SMOOTH TEXTURE) 58.6 % powder, Take 2 tablespoons daily. Drink 12 oz of water, Disp: 283 g, Rfl: 12 .  simvastatin (ZOCOR) 40 MG tablet, TAKE 1 AND 1/2 TABLETS AT BEDTIME, Disp: 135 tablet, Rfl: 1 .  tamsulosin (FLOMAX) 0.4 MG CAPS capsule, Take 1 capsule (0.4 mg total) by mouth daily. (Patient taking differently: Take 0.8 mg by mouth at bedtime. ), Disp: 90 capsule, Rfl: 3 .  timolol (TIMOPTIC) 0.5 % ophthalmic solution, Place 1 drop into both eyes 2 (two) times daily. , Disp: , Rfl: 1 .  Travoprost, BAK Free, (TRAVATAN Z) 0.004 % SOLN ophthalmic solution, Place 1 drop into both eyes at bedtime., Disp: 5 mL, Rfl: 5 .  triamcinolone (KENALOG) 0.025 % cream, Apply 1 application topically 2 (two) times daily as needed (rash). , Disp: , Rfl:  .  Vitamin D, Ergocalciferol, (DRISDOL) 1.25 MG (50000 UT) CAPS capsule, Take 1 capsule (50,000 Units total) by mouth every 7 (seven) days., Disp: 12 capsule, Rfl: 0 .  zinc sulfate 220 (50 Zn) MG capsule, Take 1 capsule (220 mg total) by mouth daily., Disp: 30 capsule, Rfl: 0 Allergies  Allergen Reactions  . Morphine And Related Other (See Comments)    Migraine   . Hydrochlorothiazide Other (See Comments)    REACTION: hyponatremia with daily use  . Tape Itching    Patient  Is ok to use paper tape  . Sulfur Dioxide   . Latex Rash  . Lisinopril Cough  . Sulfa Antibiotics Rash   Social History   Tobacco Use  Smoking Status Former Smoker  . Packs/day: 2.00  .  Years: 20.00  . Pack years: 40.00  . Types: Cigarettes  . Quit date: 10/08/1982  . Years since quitting: 37.2  Smokeless Tobacco Never Used   Objective:   Constitutional Well developed. Well nourished.  Vascular Dorsalis pedis pulses palpable bilaterally. Posterior tibial pulses  palpable bilaterally. Capillary refill normal to all digits.  No cyanosis or clubbing noted. Pedal hair growth sparse b/l.  Neurologic Normal speech. Oriented to person, place, and time. Epicritic sensation to light touch grossly present bilaterally.  Dermatologic Nails elongated dystrophic pain to palpation No open wounds. No skin lesions.  Orthopedic: Normal joint ROM without pain or crepitus bilaterally. Patient has on back brace. Utilizes rollator for gait assistance. No visible deformities. No bony tenderness.   Radiographs: None Assessment:   1. Pain due to onychomycosis of toenail    Plan:  Patient was evaluated and treated and all questions answered. Onychomycosis with pain -Nails palliatively debridement as below. -Educated on self-care  Procedure: Nail Debridement Rationale: Pain Type of Debridement: manual, sharp debridement. Instrumentation: Nail nipper, rotary burr. Number of Nails: 10  -Toenails 1-5 b/l were debrided in length and girth with sterile nail nippers and dremel without iatrogenic bleeding.  -Patient to report any pedal injuries to medical professional immediately. -Patient to continue soft, supportive shoe gear daily. -Patient/POA to call should there be question/concern in the interim.  Return in about 3 months (around 04/10/2020) for nail trim.  Marzetta Board, DPM

## 2020-01-18 ENCOUNTER — Other Ambulatory Visit: Payer: Self-pay

## 2020-01-18 ENCOUNTER — Emergency Department (HOSPITAL_COMMUNITY)
Admission: EM | Admit: 2020-01-18 | Discharge: 2020-01-19 | Disposition: A | Discharge status: Home / Self Care | Payer: Medicare Other | Attending: Emergency Medicine | Admitting: Emergency Medicine

## 2020-01-18 DIAGNOSIS — G250 Essential tremor: Secondary | ICD-10-CM | POA: Insufficient documentation

## 2020-01-18 DIAGNOSIS — Z87891 Personal history of nicotine dependence: Secondary | ICD-10-CM | POA: Diagnosis not present

## 2020-01-18 DIAGNOSIS — I10 Essential (primary) hypertension: Secondary | ICD-10-CM | POA: Diagnosis not present

## 2020-01-18 DIAGNOSIS — R079 Chest pain, unspecified: Secondary | ICD-10-CM | POA: Diagnosis not present

## 2020-01-18 DIAGNOSIS — Z91040 Latex allergy status: Secondary | ICD-10-CM | POA: Insufficient documentation

## 2020-01-18 DIAGNOSIS — Z8551 Personal history of malignant neoplasm of bladder: Secondary | ICD-10-CM | POA: Diagnosis not present

## 2020-01-18 DIAGNOSIS — R0902 Hypoxemia: Secondary | ICD-10-CM | POA: Diagnosis not present

## 2020-01-18 DIAGNOSIS — M5489 Other dorsalgia: Secondary | ICD-10-CM | POA: Diagnosis not present

## 2020-01-18 DIAGNOSIS — Z79899 Other long term (current) drug therapy: Secondary | ICD-10-CM | POA: Insufficient documentation

## 2020-01-18 DIAGNOSIS — I2510 Atherosclerotic heart disease of native coronary artery without angina pectoris: Secondary | ICD-10-CM | POA: Insufficient documentation

## 2020-01-18 DIAGNOSIS — N179 Acute kidney failure, unspecified: Secondary | ICD-10-CM | POA: Insufficient documentation

## 2020-01-18 DIAGNOSIS — R0789 Other chest pain: Secondary | ICD-10-CM | POA: Diagnosis not present

## 2020-01-18 NOTE — ED Provider Notes (Signed)
[facility] DEPT Provider Note: Casey Spurling, MD, FACEP  CSN: [id number] MRN: [id number] ARRIVAL: 01/18/20 at 2257 ROOM: [place]  01/18/20 11:37 PM [name] is a 84 y.o. male with a history of 9 essential tremor.  He has a medication he takes for his tremors but does not know the name of it.  His tremors were worse today and he took an extra dose of this unspecified medication today.  His symptoms are moderate.  He denies any other acute symptoms other than feeling cold.  He has not had a fever, nausea, vomiting, diarrhea or abdominal pain.  He has a lingering cough since having Covid in December.  His records indicate he is on Mysoline (primidone) 125 mg 3 times daily.   Past Medical History:  Diagnosis Date   Acute bronchitis per pt no fever--  cough since discharge from hospital 07-02-2015   per pcp note 07-05-2015  mild to moderate bronchitis vs pna   Allergic rhinitis    Bladder cancer (HCC)    BPH (benign prostatic hyperplasia)    Bradycardia    CAD (coronary artery disease)    Chronic low back pain    DDD (degenerative disc disease), lumbosacral    Depression    Diverticulosis of colon (without mention of hemorrhage)    Dyspnea    Essential tremor    Fatty liver disease, nonalcoholic    GERD (gastroesophageal reflux disease)    Glaucoma    History of bladder cancer    2004--  TCC low-grade , non-invasive   History of colon polyps    History of exercise stress test    02-05-2007-- ETT (Clinically positive, electrically equivocal, submaximal ETT,  appropriate bp response to exercise   History of hiatal hernia    History of kidney stones    History of peptic ulcer    HLD (hyperlipidemia)    HTN (hypertension)    Nocturia    Productive cough    Rheumatoid arthritis(714.0)    Right renal artery stenosis ([place])    per cath 03-14-2009 --- 40-50%   RLS (restless legs syndrome)     Vertigo, intermittent    Weak urinary stream    Wears dentures    upper   Wears glasses    Wears hearing aid    bilateral   Wears partial dentures    lower    Past Surgical History:  Procedure Laterality Date   CARDIAC CATHETERIZATION  02-12-2007  dr gregg taylor   Non-obstructive CAD,  20% pLCX,  20% LAD, ef 65%   CARDIAC CATHETERIZATION  03-14-2009  dr Johnsie Cancel   pLAD and mid diaginal 20-30% multiple lesions,  OM1 20%, right renal ostial 40-50%,  ef 60%   CATARACT EXTRACTION W/ INTRAOCULAR LENS  IMPLANT, BILATERAL  2016   CYSTOSCOPY WITH BIOPSY N/A 07/11/2015   Procedure: CYSTOSCOPY WITH COLD CUP RESECTION AND FULGERATION;  Surgeon: Rana Snare, MD;  Location: [facility];  Service: Urology;  Laterality: N/A;   HIP ARTHROSCOPY W/ LABRAL DEBRIDEMENT Left 06-29-2000   and chondraplasty   LUMBAR DISC SURGERY  07-16-2005   LUMBAR LAMINECTOMY/DECOMPRESSION MICRODISCECTOMY  07/09/2011   Procedure: LUMBAR LAMINECTOMY/DECOMPRESSION MICRODISCECTOMY;  Surgeon: Johnn Hai;  Location: [facility];  Service: Orthopedics;  Laterality: Left;  Decompression L5 - S1 on the Left and repair of dura   LUMBAR LAMINECTOMY/DECOMPRESSION MICRODISCECTOMY N/A 07/27/2013   Procedure: DECOMPRESSION L4 - L5 WITH EXCISION  OF SYNOVIAL CYST AND, LATERAL MASS FUSION 1 LEVEL;  Surgeon: Johnn Hai, MD;  Location: [facility];  Service: Orthopedics;  Laterality: N/A;   LUMBAR LAMINECTOMY/DECOMPRESSION MICRODISCECTOMY Left 07/06/2014   Procedure:  MICRO LUMBAR DECOMPRESSION L5-S1 ON LEFT, L4-5 REDO;  Surgeon: Johnn Hai, MD;  Location: [facility];  Service: Orthopedics;  Laterality: Left;   SPINAL CORD STIMULATOR INSERTION N/A 09/11/2016   Procedure: Spinal cord stimulator placement;  Surgeon: Melina Schools, MD;  Location: [facility];  Service: Orthopedics;  Laterality: N/A;   TONSILLECTOMY AND ADENOIDECTOMY     TRANSTHORACIC ECHOCARDIOGRAM  09-20-2010   normal LVF, ef 55-65%/  mild MR, TR  and PR/  mild LAE   TRANSURETHRAL RESECTION OF BLADDER TUMOR  09-21-2002    Family History  Problem Relation Age of Onset   Heart attack Mother    Cancer Father        lung   Cancer Sister        lung   Diabetes Sister    Cancer Brother        lung that spread to brain    Social History   Tobacco Use   Smoking status: Former Smoker    Packs/day: 2.00    Years: 20.00    Pack years: 40.00    Types: Cigarettes    Quit date: 10/08/1982    Years since quitting: 37.3   Smokeless tobacco: Never Used  Vaping Use   Vaping Use: Never used  Substance Use Topics   Alcohol use: Yes    Comment: rare wine or beer occassionally   Drug use: No    Prior to Admission medications   Medication Sig Start Date End Date Taking? Authorizing Provider  acetaminophen (TYLENOL) 500 MG tablet Take 1,000 mg by mouth every 6 (six) hours as needed for moderate pain.   Yes [provider]  albuterol (PROVENTIL) (2.5 MG/3ML) 0.083% nebulizer solution Take 3 mLs (2.5 mg total) by nebulization every 6 (six) hours as needed for wheezing or shortness of breath. 10/25/19  Yes Marrian Salvage, FNP  albuterol (VENTOLIN HFA) 108 (90 Base) MCG/ACT inhaler Inhale 2 puffs into the lungs every 6 (six) hours as needed for wheezing or shortness of breath. 10/31/19  Yes Biagio Borg, MD  amLODipine (NORVASC) 10 MG tablet TAKE 1 TABLET (10 MG TOTAL) DAILY. Patient taking differently: Take 10 mg by mouth daily.  09/05/19  Yes Biagio Borg, MD  ascorbic acid (VITAMIN C) 500 MG tablet Take 1 tablet (500 mg total) by mouth daily. 08/21/19  Yes Allie Bossier, MD  brimonidine (ALPHAGAN) 0.15 % ophthalmic solution Place 1 drop into both eyes 2 (two) times daily.   Yes [provider]  budesonide-formoterol (SYMBICORT) 160-4.5 MCG/ACT inhaler Inhale 2 puffs into the lungs 2 (two) times daily. 09/19/19  Yes Scot Jun, FNP  cetirizine (ZYRTEC) 10 MG tablet Take 10 mg by mouth daily.   Yes  [provider]  cholecalciferol (VITAMIN D3) 25 MCG (1000 UNIT) tablet Take 1,000 Units by mouth daily.   Yes [provider]  citalopram (CELEXA) 10 MG tablet TAKE 1 TABLET EVERY MORNING Patient taking differently: Take 10 mg by mouth in the morning.  10/03/19  Yes Biagio Borg, MD  clonazePAM (KLONOPIN) 0.5 MG tablet Take 1 tablet (0.5 mg total) by mouth 2 (two) times daily as needed for anxiety. 09/23/19  Yes Biagio Borg, MD  clotrimazole (LOTRIMIN) 1 % cream Apply 1 application topically 2 (  two) times daily as needed (FOR RASH). Prescribed for rash in groin   Yes [provider]  dextromethorphan-guaiFENesin (MUCINEX DM) 30-600 MG 12hr tablet Take 1 tablet by mouth 2 (two) times daily.   Yes [provider]  fluticasone (FLONASE) 50 MCG/ACT nasal spray Place 2 sprays into both nostrils daily. 10/03/19  Yes Biagio Borg, MD  furosemide (LASIX) 20 MG tablet Take one tablet daily for one week then take one table only on Tuesdays and Saturdays. Patient taking differently: Take 20 mg by mouth See admin instructions. Take one tablet daily for one week then take one table only on Tuesdays and Saturdays. 11/11/19  Yes Tobb, Kardie, DO  gabapentin (NEURONTIN) 300 MG capsule TAKE 1 TO 2 CAPSULES FOUR TIMES DAILY  AFTER MEALS AND AT BEDTIME. Patient taking differently: Take 300 mg by mouth at bedtime.  06/01/19  Yes Biagio Borg, MD  hydrALAZINE (APRESOLINE) 10 MG tablet TAKE 1 TABLET THREE TIMES DAILY AS NEEDED FOR BLOOD PRESSURE MORE THAN 150/90 Patient taking differently: Take 10 mg by mouth in the morning and at bedtime.  09/16/19  Yes Biagio Borg, MD  HYDROcodone-acetaminophen (NORCO/VICODIN) 5-325 MG tablet Take 1 tablet by mouth every 6 (six) hours as needed for severe pain. 02/15/19  Yes Plunkett, Loree Fee, MD  hydroxypropyl methylcellulose / hypromellose (ISOPTO TEARS / GONIOVISC) 2.5 % ophthalmic solution Place 1 drop into both eyes 2 (two) times daily.   Yes  [provider]  losartan (COZAAR) 100 MG tablet TAKE 1 TABLET EVERY DAY Patient taking differently: Take 100 mg by mouth daily.  08/05/19  Yes Biagio Borg, MD  meclizine (ANTIVERT) 25 MG tablet TAKE 1 TABLET TWICE DAILY AS NEEDED FOR DIZZINESS Patient taking differently: Take 25 mg by mouth 2 (two) times daily.  08/05/19  Yes Biagio Borg, MD  methocarbamol (ROBAXIN) 500 MG tablet Take 500 mg by mouth 3 (three) times daily as needed for muscle spasms.   Yes [provider]  Nutritional Supplements (JUICE PLUS FIBRE PO) Take 1 each 2 (two) times daily by mouth.   Yes [provider]  omeprazole (PRILOSEC) 40 MG capsule TAKE 1 CAPSULE TWICE DAILY Patient taking differently: Take 40 mg by mouth 2 (two) times daily.  08/08/19  Yes Biagio Borg, MD  ondansetron (ZOFRAN) 4 MG tablet Take 1 tablet (4 mg total) by mouth every 8 (eight) hours as needed for nausea or vomiting. 07/22/19  Yes Biagio Borg, MD  potassium chloride (KLOR-CON) 10 MEQ tablet Take one tablet daily for 1 week then take one tablet on on tuesdays and saturdays. Patient taking differently: Take 10 mEq by mouth See admin instructions. Take one tablet daily for 1 week then take one tablet on on tuesdays and saturdays. 11/11/19  Yes Tobb, Kardie, DO  primidone (MYSOLINE) 250 MG tablet TAKE 1/2 TABLET THREE TIMES DAILY Patient taking differently: Take 125 mg by mouth 3 (three) times daily.  09/30/19  Yes Biagio Borg, MD  simvastatin (ZOCOR) 40 MG tablet TAKE 1 AND 1/2 TABLETS AT BEDTIME Patient taking differently: Take 60 mg by mouth at bedtime.  09/23/19  Yes Biagio Borg, MD  tamsulosin (FLOMAX) 0.4 MG CAPS capsule Take 1 capsule (0.4 mg total) by mouth daily. Patient taking differently: Take 0.8 mg by mouth at bedtime.  05/17/15  Yes Biagio Borg, MD  timolol (TIMOPTIC) 0.5 % ophthalmic solution Place 1 drop into both eyes 2 (two) times daily.  05/30/15  Yes  [provider]  Travoprost, BAK Free,  (TRAVATAN Z) 0.004 % SOLN ophthalmic solution Place 1 drop into both eyes at bedtime. 09/14/13  Yes Biagio Borg, MD  triamcinolone (KENALOG) 0.025 % cream Apply 1 application topically 2 (two) times daily as needed (rash).    Yes [provider]  Insulin Pen Needle 29G X 10MM MISC Use as directed 08/21/19   Allie Bossier, MD  nitroGLYCERIN (NITROSTAT) 0.4 MG SL tablet PLACE 1 TABLET UNDER THE TONGUE EVERY 5 MINUTES AS NEEDED FOR CHEST PAIN. MAX 3 DOSES. CALL 911 IF NO IMPROVEMENT AFTER 1ST DOSE Patient taking differently: Place 0.4 mg under the tongue every 5 (five) minutes as needed for chest pain.  02/01/18   Biagio Borg, MD  insulin aspart (NOVOLOG) 100 UNIT/ML FlexPen Inject 4 Units into the skin 3 (three) times daily with meals. Patient not taking: Reported on 01/19/2020 08/21/19 01/19/20  Allie Bossier, MD  Insulin Detemir (LEVEMIR) 100 UNIT/ML Pen Inject 5 Units into the skin 2 (two) times daily. Patient not taking: Reported on 01/19/2020 08/21/19 01/19/20  Allie Bossier, MD  ipratropium-albuterol (DUONEB) 0.5-2.5 (3) MG/3ML SOLN Take 3 mLs by nebulization every 6 (six) hours as needed. Patient not taking: Reported on 01/19/2020 10/25/19 01/19/20  Marrian Salvage, FNP    Allergies Morphine and related, Hydrochlorothiazide, Tape, Sulfur dioxide, Latex, Lisinopril, and Sulfa antibiotics   REVIEW OF SYSTEMS  Negative except as noted here or in the History of Present Illness.   PHYSICAL EXAMINATION  Initial Vital Signs Blood pressure (!) 180/78, pulse 73, temperature 98.5 F (36.9 C), temperature source Oral, resp. rate 18, SpO2 96 %.  Examination General: Well-developed, well-nourished male in no acute distress; appearance consistent with age of record HENT: normocephalic; atraumatic Eyes: pupils equal, round and reactive to light; extraocular muscles intact Neck: supple Heart: regular rate and rhythm Lungs: clear to auscultation bilaterally Abdomen: soft; nondistended;  nontender; bowel sounds present Extremities: No deformity; full range of motion; pulses normal Neurologic: Awake, alert and oriented; motor function intact in all extremities and symmetric; no facial droop; generalized tremor most prominent in the upper extremities, right greater than left Skin: Warm and dry Psychiatric: Normal mood and affect   RESULTS  Summary of this visit's results, reviewed and interpreted by myself:   EKG Interpretation  Date/Time:    Ventricular Rate:    PR Interval:    QRS Duration:   QT Interval:    QTC Calculation:   R Axis:     Text Interpretation:        Laboratory Studies: Results for orders placed or performed during the hospital encounter of 01/18/20 (from the past 24 hour(s))  Urinalysis, Routine w reflex microscopic     Status: Abnormal   Collection Time: 01/18/20 11:22 PM  Result Value Ref Range   Color, Urine YELLOW YELLOW   APPearance CLEAR CLEAR   Specific Gravity, Urine 1.009 1.005 - 1.030   pH 5.0 5.0 - 8.0   Glucose, UA NEGATIVE NEGATIVE mg/dL   Hgb urine dipstick NEGATIVE NEGATIVE   Bilirubin Urine NEGATIVE NEGATIVE   Ketones, ur NEGATIVE NEGATIVE mg/dL   Protein, ur 30 (A) NEGATIVE mg/dL   Nitrite NEGATIVE NEGATIVE   Leukocytes,Ua NEGATIVE NEGATIVE   RBC / HPF 0-5 0 - 5 RBC/hpf   WBC, UA 6-10 0 - 5 WBC/hpf   Bacteria, UA RARE (A) NONE SEEN   Squamous Epithelial / LPF 0-5 0 - 5   Mucus PRESENT  Hyaline Casts, UA PRESENT   CBC with Differential/Platelet     Status: None   Collection Time: 01/18/20 11:22 PM  Result Value Ref Range   WBC 7.1 4.0 - 10.5 K/uL   RBC 5.02 4.22 - 5.81 MIL/uL   Hemoglobin 14.9 13.0 - 17.0 g/dL   HCT 45.3 39 - 52 %   MCV 90.2 80.0 - 100.0 fL   MCH 29.7 26.0 - 34.0 pg   MCHC 32.9 30.0 - 36.0 g/dL   RDW 14.7 11.5 - 15.5 %   Platelets 211 150 - 400 K/uL   nRBC 0.0 0.0 - 0.2 %   Neutrophils Relative % 74 %   Neutro Abs 5.4 1.7 - 7.7 K/uL   Lymphocytes Relative 11 %   Lymphs Abs 0.8 0.7 - 4.0  K/uL   Monocytes Relative 12 %   Monocytes Absolute 0.8 0 - 1 K/uL   Eosinophils Relative 2 %   Eosinophils Absolute 0.1 0 - 0 K/uL   Basophils Relative 1 %   Basophils Absolute 0.0 0 - 0 K/uL   Immature Granulocytes 0 %   Abs Immature Granulocytes 0.02 0.00 - 0.07 K/uL  Basic metabolic panel     Status: Abnormal   Collection Time: 01/18/20 11:22 PM  Result Value Ref Range   Sodium 140 135 - 145 mmol/L   Potassium 4.5 3.5 - 5.1 mmol/L   Chloride 103 98 - 111 mmol/L   CO2 25 22 - 32 mmol/L   Glucose, Bld 132 (H) 70 - 99 mg/dL   BUN 14 8 - 23 mg/dL   Creatinine, Ser 1.64 (H) 0.61 - 1.24 mg/dL   Calcium 9.0 8.9 - 10.3 mg/dL   GFR calc non Af Amer 38 (L) >60 mL/min   GFR calc Af Amer 44 (L) >60 mL/min   Anion gap 12 5 - 15   Imaging Studies: No results found.  ED COURSE and MDM  Nursing notes, initial and subsequent vitals signs, including pulse oximetry, reviewed and interpreted by myself.  Vitals:   01/19/20 0215 01/19/20 0300 01/19/20 0328 01/19/20 0441  BP:  (!) 175/75 132/75 (!) 169/76  Pulse: 63 63 (!) 59 61  Resp: 17 16 15 15   Temp:    98.2 F (36.8 C)  TempSrc:    Oral  SpO2: 95% 93% 95% 96%   Medications  propranolol (INDERAL) injection 1 mg (1 mg Intravenous Given 01/19/20 0205)   4:26 AM Patient symptoms improved after IV propranolol 1 mg.  His lab work is unremarkable except for a slightly worse creatinine.  He and his daughter were informed of this and I will send a note to his PCP as he will need follow-up for this.   PROCEDURES  Procedures   ED DIAGNOSES     ICD-10-CM   1. Essential tremor  G25.0   2. AKI (acute kidney injury) ([place])  N17.9        Santana Gosdin, MD 01/19/20 [phone]

## 2020-01-18 NOTE — ED Triage Notes (Signed)
Pt BIB EMS from home c/o tremors in extremeties. Pt has hx of tremors and is currently taking medication for them. Pt states the tremors started today at ~12 pm. Pt did take the medication when he noticed the tremors. Also c/o chest pain and cough. Hx of COVID in Dec. Pt A&O and ambulatory with no issues at this time.

## 2020-01-19 DIAGNOSIS — G250 Essential tremor: Secondary | ICD-10-CM | POA: Diagnosis not present

## 2020-01-19 LAB — CBC WITH DIFFERENTIAL/PLATELET
Abs Immature Granulocytes: 0.02 10*3/uL (ref 0.00–0.07)
Basophils Absolute: 0 10*3/uL (ref 0.0–0.1)
Basophils Relative: 1 %
Eosinophils Absolute: 0.1 10*3/uL (ref 0.0–0.5)
Eosinophils Relative: 2 %
HCT: 45.3 % (ref 39.0–52.0)
Hemoglobin: 14.9 g/dL (ref 13.0–17.0)
Immature Granulocytes: 0 %
Lymphocytes Relative: 11 %
Lymphs Abs: 0.8 10*3/uL (ref 0.7–4.0)
MCH: 29.7 pg (ref 26.0–34.0)
MCHC: 32.9 g/dL (ref 30.0–36.0)
MCV: 90.2 fL (ref 80.0–100.0)
Monocytes Absolute: 0.8 10*3/uL (ref 0.1–1.0)
Monocytes Relative: 12 %
Neutro Abs: 5.4 10*3/uL (ref 1.7–7.7)
Neutrophils Relative %: 74 %
Platelets: 211 10*3/uL (ref 150–400)
RBC: 5.02 MIL/uL (ref 4.22–5.81)
RDW: 14.7 % (ref 11.5–15.5)
WBC: 7.1 10*3/uL (ref 4.0–10.5)
nRBC: 0 % (ref 0.0–0.2)

## 2020-01-19 LAB — BASIC METABOLIC PANEL
Anion gap: 12 (ref 5–15)
BUN: 14 mg/dL (ref 8–23)
CO2: 25 mmol/L (ref 22–32)
Calcium: 9 mg/dL (ref 8.9–10.3)
Chloride: 103 mmol/L (ref 98–111)
Creatinine, Ser: 1.64 mg/dL — ABNORMAL HIGH (ref 0.61–1.24)
GFR calc Af Amer: 44 mL/min — ABNORMAL LOW (ref >60)
GFR calc non Af Amer: 38 mL/min — ABNORMAL LOW (ref >60)
Glucose, Bld: 132 mg/dL — ABNORMAL HIGH (ref 70–99)
Potassium: 4.5 mmol/L (ref 3.5–5.1)
Sodium: 140 mmol/L (ref 135–145)

## 2020-01-19 LAB — URINALYSIS, ROUTINE W REFLEX MICROSCOPIC
Bilirubin Urine: NEGATIVE
Glucose, UA: NEGATIVE mg/dL
Hgb urine dipstick: NEGATIVE
Ketones, ur: NEGATIVE mg/dL
Leukocytes,Ua: NEGATIVE
Nitrite: NEGATIVE
Protein, ur: 30 mg/dL — AB
Specific Gravity, Urine: 1.009 (ref 1.005–1.030)
pH: 5 (ref 5.0–8.0)

## 2020-01-19 MED ORDER — FUROSEMIDE 10 MG/ML IJ SOLN: 40.0000 mg | Freq: Once | INTRAMUSCULAR | Status: DC

## 2020-01-19 MED ORDER — PROPRANOLOL HCL 1 MG/ML IV SOLN
1.0000 mg | Freq: Once | INTRAVENOUS | Status: AC
  Administered 2020-01-19: 1 mg via INTRAVENOUS
  Filled 2020-01-19: qty 1

## 2020-01-19 NOTE — ED Notes (Signed)
Verbalized understanding discharge instructions and follow-up. In no acute distress.   

## 2020-01-19 NOTE — ED Notes (Signed)
Pt reports he is feeling better and the tremors have decreased.

## 2020-01-19 NOTE — ED Notes (Signed)
Jennie Bolar (daughter) [phone] would like an update.

## 2020-01-19 NOTE — ED Notes (Signed)
Pt's daughter reports the tremoring has decreased, but sts "he's sleeping."  Daughter believes the tremors have possibly increased d/t being in pain.  Pt has multiple fractures in his back.

## 2020-01-19 NOTE — ED Notes (Signed)
ED Provider at bedside. 

## 2020-01-23 ENCOUNTER — Encounter: Payer: Self-pay | Admitting: Internal Medicine

## 2020-01-23 ENCOUNTER — Other Ambulatory Visit: Payer: Self-pay

## 2020-01-23 ENCOUNTER — Ambulatory Visit (INDEPENDENT_AMBULATORY_CARE_PROVIDER_SITE_OTHER): Payer: Medicare Other | Admitting: Internal Medicine

## 2020-01-23 VITALS — BP 130/76 | HR 58 | Temp 98.1°F | Ht 66.0 in | Wt 216.0 lb

## 2020-01-23 DIAGNOSIS — R413 Other amnesia: Secondary | ICD-10-CM | POA: Diagnosis not present

## 2020-01-23 DIAGNOSIS — G250 Essential tremor: Secondary | ICD-10-CM | POA: Diagnosis not present

## 2020-01-23 DIAGNOSIS — F419 Anxiety disorder, unspecified: Secondary | ICD-10-CM | POA: Diagnosis not present

## 2020-01-23 DIAGNOSIS — R7302 Impaired glucose tolerance (oral): Secondary | ICD-10-CM

## 2020-01-23 DIAGNOSIS — N179 Acute kidney failure, unspecified: Secondary | ICD-10-CM | POA: Diagnosis not present

## 2020-01-23 MED ORDER — CITALOPRAM HYDROBROMIDE 20 MG PO TABS: 20.0000 mg | ORAL_TABLET | Freq: Every day | ORAL | 3 refills | Status: DC

## 2020-01-23 MED ORDER — PRIMIDONE 250 MG PO TABS: 250.0000 mg | ORAL_TABLET | Freq: Four times a day (QID) | ORAL | 3 refills | Status: DC

## 2020-01-23 NOTE — Assessment & Plan Note (Signed)
[place] for increased celexa to 20 mg per day

## 2020-01-23 NOTE — Patient Instructions (Signed)
Ok to increase the primidone to 250 mg four times per day  Ok to increase the celexa to 20 mg per day  Please continue all other medications as before, and refills have been done if requested.  Please have the pharmacy call with any other refills you may need.  Please continue your efforts at being more active, low cholesterol diet, and weight control.  Please keep your appointments with your specialists as you may have planned  Please go to the LAB at the blood drawing area for the tests to be done  You will be contacted by phone if any changes need to be made immediately.  Otherwise, you will receive a letter about your results with an explanation, but please check with MyChart first.  Please remember to sign up for MyChart if you have not done so, as this will be important to you in the future with finding out test results, communicating by private email, and scheduling acute appointments online when needed.  Please make an Appointment to return in 3 months, or sooner if needed

## 2020-01-23 NOTE — Assessment & Plan Note (Signed)
?   Mild recent worsening, pt declines neurology eval for now

## 2020-01-23 NOTE — Assessment & Plan Note (Signed)
[place] for increased primidone to qid

## 2020-01-23 NOTE — Progress Notes (Signed)
Subjective:    Patient ID: [name], male    DOB: [birth year], 84 y.o.   MRN: [id number]  HPI  Here to f/u with son, as has mild worsening memory issues in the past yr.   Had recent fall with compression fx x 2, no procedures per Dr Tonita Cong due to risk, but has DXA ordered.  Seen at ED with increased tremors, with neg evaluation, asked to f/u here.  Also Denies worsening depressive symptoms, suicidal ideation, or panic; has ongoing anxiety, some increased recently.  Pt denies chest pain, increased sob or doe, wheezing, orthopnea, PND, increased LE swelling, palpitations, dizziness or syncope.  Pt denies new neurological symptoms such as new headache, or facial or extremity weakness or numbness   Pt denies polydipsia, polyuria.  aLso noted was increased Creatinine, and pt realizes now may have reduced his po intake some recenlty after the fall with the spine fractures and more difficult to get around  Only takes the lasix on average twice per pwk Past Medical History:  Diagnosis Date  . Acute bronchitis per pt no fever--  cough since discharge from hospital 07-02-2015   per pcp note 07-05-2015  mild to moderate bronchitis vs pna  . Allergic rhinitis   . Bladder cancer ([place])   . BPH (benign prostatic hyperplasia)   . Bradycardia   . CAD (coronary artery disease)   . Chronic low back pain   . DDD (degenerative disc disease), lumbosacral   . Depression   . Diverticulosis of colon (without mention of hemorrhage)   . Dyspnea   . Essential tremor   . Fatty liver disease, nonalcoholic   . GERD (gastroesophageal reflux disease)   . Glaucoma   . History of bladder cancer    2004--  TCC low-grade , non-invasive  . History of colon polyps   . History of exercise stress test    02-05-2007-- ETT (Clinically positive, electrically equivocal, submaximal ETT,  appropriate bp response to exercise  . History of hiatal hernia   . History of kidney stones   . History of peptic ulcer   . HLD  (hyperlipidemia)   . HTN (hypertension)   . Nocturia   . Productive cough   . Rheumatoid arthritis(714.0)   . Right renal artery stenosis (H[facility])    per cath 03-14-2009 --- 40-50%  . RLS (restless legs syndrome)   . Vertigo, intermittent   . Weak urinary stream   . Wears dentures    upper  . Wears glasses   . Wears hearing aid    bilateral  . Wears partial dentures    lower   Past Surgical History:  Procedure Laterality Date  . CARDIAC CATHETERIZATION  02-12-2007  dr gregg taylor   Non-obstructive CAD,  20% pLCX,  20% LAD, ef 65%  . CARDIAC CATHETERIZATION  03-14-2009  dr Johnsie Cancel   pLAD and mid diaginal 20-30% multiple lesions,  OM1 20%, right renal ostial 40-50%,  ef 60%  . CATARACT EXTRACTION W/ INTRAOCULAR LENS  IMPLANT, BILATERAL  2016  . CYSTOSCOPY WITH BIOPSY N/A 07/11/2015   Procedure: CYSTOSCOPY WITH COLD CUP RESECTION AND FULGERATION;  Surgeon: Rana Snare, MD;  Location: [facility];  Service: Urology;  Laterality: N/A;  . HIP ARTHROSCOPY W/ LABRAL DEBRIDEMENT Left 06-29-2000   and chondraplasty  . LUMBAR [place] SURGERY  07-16-2005  . LUMBAR LAMINECTOMY/DECOMPRESSION MICRODISCECTOMY  07/09/2011   Procedure: LUMBAR LAMINECTOMY/DECOMPRESSION MICRODISCECTOMY;  Surgeon: Johnn Hai;  Location: [facility];  Service: Orthopedics;  Laterality: Left;  Decompression L5 - S1 on the Left and repair of dura  . LUMBAR LAMINECTOMY/DECOMPRESSION MICRODISCECTOMY N/A 07/27/2013   Procedure: DECOMPRESSION L4 - L5 WITH EXCISION OF SYNOVIAL CYST AND, LATERAL MASS FUSION 1 LEVEL;  Surgeon: Johnn Hai, MD;  Location: [facility];  Service: Orthopedics;  Laterality: N/A;  . LUMBAR LAMINECTOMY/DECOMPRESSION MICRODISCECTOMY Left 07/06/2014   Procedure:  MICRO LUMBAR DECOMPRESSION L5-S1 ON LEFT, L4-5 REDO;  Surgeon: Johnn Hai, MD;  Location: [facility];  Service: Orthopedics;  Laterality: Left;  . SPINAL CORD STIMULATOR INSERTION N/A 09/11/2016   Procedure: Spinal cord stimulator  placement;  Surgeon: Melina Schools, MD;  Location: [place];  Service: Orthopedics;  Laterality: N/A;  . TONSILLECTOMY AND ADENOIDECTOMY    . TRANSTHORACIC ECHOCARDIOGRAM  09-20-2010   normal LVF, ef 55-65%/  mild MR, TR and PR/  mild LAE  . TRANSURETHRAL RESECTION OF BLADDER TUMOR  09-21-2002    reports that he quit smoking about 37 years ago. His smoking use included cigarettes. He has a 40.00 pack-year smoking history. He has never used smokeless tobacco. He reports current alcohol use. He reports that he does not use drugs. family history includes Cancer in his brother, father, and sister; Diabetes in his sister; Heart attack in his mother. Allergies  Allergen Reactions  . Morphine And Related Other (See Comments)    Migraine   . Hydrochlorothiazide Other (See Comments)    REACTION: hyponatremia with daily use  . Tape Itching    Patient  Is ok to use paper tape  . Sulfur Dioxide   . Latex Rash  . Lisinopril Cough  . Sulfa Antibiotics Rash   Current Outpatient Medications on File Prior to Visit  Medication Sig Dispense Refill  . acetaminophen (TYLENOL) 500 MG tablet Take 1,000 mg by mouth every 6 (six) hours as needed for moderate pain.    Marland Kitchen albuterol (PROVENTIL) (2.5 MG/3ML) 0.083% nebulizer solution Take 3 mLs (2.5 mg total) by nebulization every 6 (six) hours as needed for wheezing or shortness of breath. 150 mL 1  . albuterol (VENTOLIN HFA) 108 (90 Base) MCG/ACT inhaler Inhale 2 puffs into the lungs every 6 (six) hours as needed for wheezing or shortness of breath. 18 g 11  . amLODipine (NORVASC) 10 MG tablet TAKE 1 TABLET (10 MG TOTAL) DAILY. (Patient taking differently: Take 10 mg by mouth daily. ) 90 tablet 1  . ascorbic acid (VITAMIN C) 500 MG tablet Take 1 tablet (500 mg total) by mouth daily. 30 tablet 0  . brimonidine (ALPHAGAN) 0.15 % ophthalmic solution Place 1 drop into both eyes 2 (two) times daily.    . budesonide-formoterol (SYMBICORT) 160-4.5 MCG/ACT inhaler Inhale 2  puffs into the lungs 2 (two) times daily. 1 Inhaler 3  . cetirizine (ZYRTEC) 10 MG tablet Take 10 mg by mouth daily.    . cholecalciferol (VITAMIN D3) 25 MCG (1000 UNIT) tablet Take 1,000 Units by mouth daily.    . clonazePAM (KLONOPIN) 0.5 MG tablet Take 1 tablet (0.5 mg total) by mouth 2 (two) times daily as needed for anxiety. 60 tablet 1  . clotrimazole (LOTRIMIN) 1 % cream Apply 1 application topically 2 (two) times daily as needed (FOR RASH). Prescribed for rash in groin    . dextromethorphan-guaiFENesin (MUCINEX DM) 30-600 MG 12hr tablet Take 1 tablet by mouth 2 (two) times daily.    . fluticasone (FLONASE) 50 MCG/ACT nasal spray Place 2 sprays into both nostrils daily. 48 g 1  . furosemide (  LASIX) 20 MG tablet Take one tablet daily for one week then take one table only on Tuesdays and Saturdays. (Patient taking differently: Take 20 mg by mouth See admin instructions. Take one tablet daily for one week then take one table only on Tuesdays and Saturdays.) 30 tablet 1  . gabapentin (NEURONTIN) 300 MG capsule TAKE 1 TO 2 CAPSULES FOUR TIMES DAILY  AFTER MEALS AND AT BEDTIME. (Patient taking differently: Take 300 mg by mouth at bedtime. ) 720 capsule 3  . hydrALAZINE (APRESOLINE) 10 MG tablet TAKE 1 TABLET THREE TIMES DAILY AS NEEDED FOR BLOOD PRESSURE MORE THAN 150/90 (Patient taking differently: Take 10 mg by mouth in the morning and at bedtime. ) 270 tablet 0  . HYDROcodone-acetaminophen (NORCO/VICODIN) 5-325 MG tablet Take 1 tablet by mouth every 6 (six) hours as needed for severe pain. 10 tablet 0  . hydroxypropyl methylcellulose / hypromellose (ISOPTO TEARS / GONIOVISC) 2.5 % ophthalmic solution Place 1 drop into both eyes 2 (two) times daily.    . Insulin Pen Needle 29G X 10MM MISC Use as directed 200 each 0  . losartan (COZAAR) 100 MG tablet TAKE 1 TABLET EVERY DAY (Patient taking differently: Take 100 mg by mouth daily. ) 90 tablet 2  . meclizine (ANTIVERT) 25 MG tablet TAKE 1 TABLET TWICE  DAILY AS NEEDED FOR DIZZINESS (Patient taking differently: Take 25 mg by mouth 2 (two) times daily. ) 180 tablet 1  . methocarbamol (ROBAXIN) 500 MG tablet Take 500 mg by mouth 3 (three) times daily as needed for muscle spasms.    . nitroGLYCERIN (NITROSTAT) 0.4 MG SL tablet PLACE 1 TABLET UNDER THE TONGUE EVERY 5 MINUTES AS NEEDED FOR CHEST PAIN. MAX 3 DOSES. CALL 911 IF NO IMPROVEMENT AFTER 1ST DOSE (Patient taking differently: Place 0.4 mg under the tongue every 5 (five) minutes as needed for chest pain. ) 25 tablet 0  . Nutritional Supplements (JUICE PLUS FIBRE PO) Take 1 each 2 (two) times daily by mouth.    Marland Kitchen omeprazole (PRILOSEC) 40 MG capsule TAKE 1 CAPSULE TWICE DAILY (Patient taking differently: Take 40 mg by mouth 2 (two) times daily. ) 180 capsule 3  . ondansetron (ZOFRAN) 4 MG tablet Take 1 tablet (4 mg total) by mouth every 8 (eight) hours as needed for nausea or vomiting. 30 tablet 2  . potassium chloride (KLOR-CON) 10 MEQ tablet Take one tablet daily for 1 week then take one tablet on on tuesdays and saturdays. (Patient taking differently: Take 10 mEq by mouth See admin instructions. Take one tablet daily for 1 week then take one tablet on on tuesdays and saturdays.) 30 tablet 1  . simvastatin (ZOCOR) 40 MG tablet TAKE 1 AND 1/2 TABLETS AT BEDTIME (Patient taking differently: Take 60 mg by mouth at bedtime. ) 135 tablet 1  . tamsulosin (FLOMAX) 0.4 MG CAPS capsule Take 1 capsule (0.4 mg total) by mouth daily. (Patient taking differently: Take 0.8 mg by mouth at bedtime. ) 90 capsule 3  . timolol (TIMOPTIC) 0.5 % ophthalmic solution Place 1 drop into both eyes 2 (two) times daily.   1  . Travoprost, BAK Free, (TRAVATAN Z) 0.004 % SOLN ophthalmic solution Place 1 drop into both eyes at bedtime. 5 mL 5  . triamcinolone (KENALOG) 0.025 % cream Apply 1 application topically 2 (two) times daily as needed (rash).     . [DISCONTINUED] insulin aspart (NOVOLOG) 100 UNIT/ML FlexPen Inject 4 Units  into the skin 3 (three) times daily with meals. (  Patient not taking: Reported on 01/19/2020) 6 mL 0  . [DISCONTINUED] Insulin Detemir (LEVEMIR) 100 UNIT/ML Pen Inject 5 Units into the skin 2 (two) times daily. (Patient not taking: Reported on 01/19/2020) 6 mL 0  . [DISCONTINUED] ipratropium-albuterol (DUONEB) 0.5-2.5 (3) MG/3ML SOLN Take 3 mLs by nebulization every 6 (six) hours as needed. (Patient not taking: Reported on 01/19/2020) 360 mL 0   No current facility-administered medications on file prior to visit.   Review of Systems All otherwise neg per pt     Objective:   Physical Exam BP 130/76 (BP Location: Left Arm, Patient Position: Sitting, Cuff Size: Large)   Pulse (!) 58   Temp 98.1 F (36.7 C) (Oral)   Ht 5\' 6"  (1.676 m)   Wt 216 lb (98 kg)   SpO2 95%   BMI 34.86 kg/m  VS noted, wearing back brace Constitutional: Pt appears in NAD HENT: Head: NCAT.  Right Ear: External ear normal.  Left Ear: External ear normal.  Eyes: . Pupils are equal, round, and reactive to light. Conjunctivae and EOM are normal Nose: without d/c or deformity Neck: Neck supple. Gross normal ROM Cardiovascular: Normal rate and regular rhythm.   Pulmonary/Chest: Effort normal and breath sounds without rales or wheezing.  Abd:  Soft, NT, ND, + BS, no organomegaly Neurological: Pt is alert. At baseline orientation, motor grossly intact Skin: Skin is warm. No rashes, other new lesions, no LE edema Psychiatric: Pt behavior is normal without agitation , 1-2+ nervous All otherwise neg per pt Lab Results  Component Value Date   WBC 7.1 01/18/2020   HGB 14.9 01/18/2020   HCT 45.3 01/18/2020   PLT 211 01/18/2020   GLUCOSE 132 (H) 01/18/2020   CHOL 164 06/24/2019   TRIG 201 (H) 08/16/2019   HDL 48.80 06/24/2019   LDLDIRECT 86.0 06/24/2019   LDLCALC 65 10/14/2017   ALT 41 10/31/2019   AST 31 10/31/2019   NA 140 01/18/2020   K 4.5 01/18/2020   CL 103 01/18/2020   CREATININE 1.64 (H) 01/18/2020   BUN 14  01/18/2020   CO2 25 01/18/2020   TSH 3.78 06/24/2019   PSA 0.53 11/12/2016   INR 1.15 02/23/2018   HGBA1C 6.0 (H) 08/18/2019      Assessment & Plan:

## 2020-01-23 NOTE — Assessment & Plan Note (Addendum)
Mild, likely due to recent overall less po intake with reduced mobiity in the home related to spine fx x 2 now wearing back brace, for increase fluids oral, and f/u bmp  I spent 41 minutes in preparing to see the patient by review of recent labs, imaging and procedures, obtaining and reviewing separately obtained history, communicating with the patient and family or caregiver, ordering medications, tests or procedures, and documenting clinical information in the EHR including the differential Dx, treatment, and any further evaluation and other management of AKI on CKD, memory loss, anxiety, hyperglycemia, tremor

## 2020-01-23 NOTE — Assessment & Plan Note (Signed)
stable overall by history and exam, recent data reviewed with pt, and pt to continue medical treatment as before,  to f/u any worsening symptoms or concerns  

## 2020-01-24 LAB — BASIC METABOLIC PANEL
BUN/Creatinine Ratio: 16 (calc) (ref 6–22)
BUN: 18 mg/dL (ref 7–25)
CO2: 28 mmol/L (ref 20–32)
Calcium: 9.1 mg/dL (ref 8.6–10.3)
Chloride: 104 mmol/L (ref 98–110)
Creat: 1.13 mg/dL — ABNORMAL HIGH (ref 0.70–1.11)
Glucose, Bld: 115 mg/dL — ABNORMAL HIGH (ref 65–99)
Potassium: 4 mmol/L (ref 3.5–5.3)
Sodium: 141 mmol/L (ref 135–146)

## 2020-01-26 ENCOUNTER — Encounter: Payer: Self-pay | Admitting: Internal Medicine

## 2020-01-31 ENCOUNTER — Other Ambulatory Visit: Payer: Self-pay | Admitting: Specialist

## 2020-01-31 DIAGNOSIS — M810 Age-related osteoporosis without current pathological fracture: Secondary | ICD-10-CM

## 2020-02-03 ENCOUNTER — Telehealth: Payer: Self-pay | Admitting: Internal Medicine

## 2020-02-03 NOTE — Telephone Encounter (Signed)
New message:   Pt's son is calling in reference to the pt's lab results. He states that they were speaking of sending the pt to the kidney doc depending on these lab results. Please advise.

## 2020-02-08 DIAGNOSIS — S32019D Unspecified fracture of first lumbar vertebra, subsequent encounter for fracture with routine healing: Secondary | ICD-10-CM | POA: Diagnosis not present

## 2020-02-08 DIAGNOSIS — M5417 Radiculopathy, lumbosacral region: Secondary | ICD-10-CM | POA: Diagnosis not present

## 2020-02-09 NOTE — Telephone Encounter (Signed)
Spoke with pts son and informed him of the results and Dr Gwynn Burly note. Pt son understood and has no further questions or concerns at this time.

## 2020-02-15 ENCOUNTER — Other Ambulatory Visit: Payer: Self-pay | Admitting: Internal Medicine

## 2020-02-15 NOTE — Telephone Encounter (Signed)
Done erx 

## 2020-02-17 ENCOUNTER — Telehealth: Payer: Self-pay | Admitting: Internal Medicine

## 2020-02-17 DIAGNOSIS — R413 Other amnesia: Secondary | ICD-10-CM

## 2020-02-17 NOTE — Telephone Encounter (Signed)
New message:   Pt's son is calling and states the pt needs a referral sent over to the neurology office of Dr. Fraser Din before the pt can be scheduled for an appt. Please advise.

## 2020-02-20 NOTE — Telephone Encounter (Signed)
Ok referral done to dr Tat

## 2020-02-20 NOTE — Telephone Encounter (Signed)
Sent to Dr. John. 

## 2020-02-21 ENCOUNTER — Encounter: Payer: Self-pay | Admitting: Neurology

## 2020-02-22 ENCOUNTER — Encounter: Payer: Self-pay | Admitting: Cardiology

## 2020-02-22 ENCOUNTER — Other Ambulatory Visit: Payer: Self-pay

## 2020-02-22 ENCOUNTER — Ambulatory Visit (INDEPENDENT_AMBULATORY_CARE_PROVIDER_SITE_OTHER): Payer: Medicare Other | Admitting: Cardiology

## 2020-02-22 VITALS — BP 110/70 | HR 64 | Ht 66.0 in | Wt 210.0 lb

## 2020-02-22 DIAGNOSIS — R0602 Shortness of breath: Secondary | ICD-10-CM | POA: Diagnosis not present

## 2020-02-22 DIAGNOSIS — E669 Obesity, unspecified: Secondary | ICD-10-CM

## 2020-02-22 DIAGNOSIS — E782 Mixed hyperlipidemia: Secondary | ICD-10-CM | POA: Diagnosis not present

## 2020-02-22 DIAGNOSIS — I2510 Atherosclerotic heart disease of native coronary artery without angina pectoris: Secondary | ICD-10-CM | POA: Diagnosis not present

## 2020-02-22 DIAGNOSIS — I701 Atherosclerosis of renal artery: Secondary | ICD-10-CM

## 2020-02-22 DIAGNOSIS — I272 Pulmonary hypertension, unspecified: Secondary | ICD-10-CM | POA: Diagnosis not present

## 2020-02-22 DIAGNOSIS — I77810 Thoracic aortic ectasia: Secondary | ICD-10-CM | POA: Diagnosis not present

## 2020-02-22 DIAGNOSIS — I10 Essential (primary) hypertension: Secondary | ICD-10-CM | POA: Diagnosis not present

## 2020-02-22 DIAGNOSIS — N183 Chronic kidney disease, stage 3 unspecified: Secondary | ICD-10-CM

## 2020-02-22 NOTE — Patient Instructions (Signed)

## 2020-02-22 NOTE — Progress Notes (Signed)
Cardiology Office Note:    Date:  02/22/2020   ID:  [name], DOB [birth year], MRN [id number]  PCP:  Biagio Borg, MD  Cardiologist:  Berniece Salines, DO  Electrophysiologist:  None   Referring MD: Biagio Borg, MD   Chief Complaint  Patient presents with  . Follow-up    History of Present Illness:    [name] is a 84 y.o. male with a hx of history of nonobstructive CAD per records cardiac catheterization in 2010 hypertension, hyperlipidemia, history of COVID-19 infection and COPD presents today for follow-up visit.  I last saw the patient on November 11, 2019 at that time giving his symptoms I recommended he undergo nuclear stress test as well as an echocardiogram.  In the interim the patient able to get his testing done, and I was able to discuss the results with him prior.  Today he is here with his son.  The patient reported that he had a lumbar fracture during the time of his pulmonary function test.  He is currently in a brace and has been following up with orthopedics.  He tells me that he is still experiencing some shortness of breath.  No chest pain still.  Past Medical History:  Diagnosis Date  . Acute bronchitis per pt no fever--  cough since discharge from hospital 07-02-2015   per pcp note 07-05-2015  mild to moderate bronchitis vs pna  . Allergic rhinitis   . Bladder cancer ([place])   . BPH (benign prostatic hyperplasia)   . Bradycardia   . CAD (coronary artery disease)   . Chronic low back pain   . DDD (degenerative disc disease), lumbosacral   . Depression   . Diverticulosis of colon (without mention of hemorrhage)   . Dyspnea   . Essential tremor   . Fatty liver disease, nonalcoholic   . GERD (gastroesophageal reflux disease)   . Glaucoma   . History of bladder cancer    2004--  TCC low-grade , non-invasive  . History of colon polyps   . History of exercise stress test    02-05-2007-- ETT (Clinically positive, electrically equivocal, submaximal ETT,   appropriate bp response to exercise  . History of hiatal hernia   . History of kidney stones   . History of peptic ulcer   . HLD (hyperlipidemia)   . HTN (hypertension)   . Nocturia   . Productive cough   . Rheumatoid arthritis(714.0)   . Right renal artery stenosis (HCC)    per cath 03-14-2009 --- 40-50%  . RLS (restless legs syndrome)   . Vertigo, intermittent   . Weak urinary stream   . Wears dentures    upper  . Wears glasses   . Wears hearing aid    bilateral  . Wears partial dentures    lower    Past Surgical History:  Procedure Laterality Date  . CARDIAC CATHETERIZATION  02-12-2007  dr gregg taylor   Non-obstructive CAD,  20% pLCX,  20% LAD, ef 65%  . CARDIAC CATHETERIZATION  03-14-2009  dr Johnsie Cancel   pLAD and mid diaginal 20-30% multiple lesions,  OM1 20%, right renal ostial 40-50%,  ef 60%  . CATARACT EXTRACTION W/ INTRAOCULAR LENS  IMPLANT, BILATERAL  2016  . CYSTOSCOPY WITH BIOPSY N/A 07/11/2015   Procedure: CYSTOSCOPY WITH COLD CUP RESECTION AND FULGERATION;  Surgeon: Rana Snare, MD;  Location: [facility];  Service: Urology;  Laterality: N/A;  . HIP ARTHROSCOPY W/ LABRAL DEBRIDEMENT Left  06-29-2000   and chondraplasty  . LUMBAR [place] SURGERY  07-16-2005  . LUMBAR LAMINECTOMY/DECOMPRESSION MICRODISCECTOMY  07/09/2011   Procedure: LUMBAR LAMINECTOMY/DECOMPRESSION MICRODISCECTOMY;  Surgeon: Johnn Hai;  Location: [facility];  Service: Orthopedics;  Laterality: Left;  Decompression L5 - S1 on the Left and repair of dura  . LUMBAR LAMINECTOMY/DECOMPRESSION MICRODISCECTOMY N/A 07/27/2013   Procedure: DECOMPRESSION L4 - L5 WITH EXCISION OF SYNOVIAL CYST AND, LATERAL MASS FUSION 1 LEVEL;  Surgeon: Johnn Hai, MD;  Location: [facility];  Service: Orthopedics;  Laterality: N/A;  . LUMBAR LAMINECTOMY/DECOMPRESSION MICRODISCECTOMY Left 07/06/2014   Procedure:  MICRO LUMBAR DECOMPRESSION L5-S1 ON LEFT, L4-5 REDO;  Surgeon: Johnn Hai, MD;  Location: [facility];   Service: Orthopedics;  Laterality: Left;  . SPINAL CORD STIMULATOR INSERTION N/A 09/11/2016   Procedure: Spinal cord stimulator placement;  Surgeon: Melina Schools, MD;  Location: [facility];  Service: Orthopedics;  Laterality: N/A;  . TONSILLECTOMY AND ADENOIDECTOMY    . TRANSTHORACIC ECHOCARDIOGRAM  09-20-2010   normal LVF, ef 55-65%/  mild MR, TR and PR/  mild LAE  . TRANSURETHRAL RESECTION OF BLADDER TUMOR  09-21-2002    Current Medications: Current Meds  Medication Sig  . acetaminophen (TYLENOL) 500 MG tablet Take 1,000 mg by mouth every 6 (six) hours as needed for moderate pain.  Marland Kitchen albuterol (PROVENTIL) (2.5 MG/3ML) 0.083% nebulizer solution Take 3 mLs (2.5 mg total) by nebulization every 6 (six) hours as needed for wheezing or shortness of breath.  Marland Kitchen albuterol (VENTOLIN HFA) 108 (90 Base) MCG/ACT inhaler Inhale 2 puffs into the lungs every 6 (six) hours as needed for wheezing or shortness of breath.  Marland Kitchen amLODipine (NORVASC) 10 MG tablet TAKE 1 TABLET (10 MG TOTAL) DAILY.  Marland Kitchen ascorbic acid (VITAMIN C) 500 MG tablet Take 1 tablet (500 mg total) by mouth daily.  . brimonidine (ALPHAGAN) 0.15 % ophthalmic solution Place 1 drop into both eyes 2 (two) times daily.  . budesonide-formoterol (SYMBICORT) 160-4.5 MCG/ACT inhaler Inhale 2 puffs into the lungs 2 (two) times daily.  . cetirizine (ZYRTEC) 10 MG tablet Take 10 mg by mouth daily.  . cholecalciferol (VITAMIN D3) 25 MCG (1000 UNIT) tablet Take 1,000 Units by mouth daily.  . citalopram (CELEXA) 20 MG tablet Take 1 tablet (20 mg total) by mouth daily.  . clonazePAM (KLONOPIN) 0.5 MG tablet TAKE 1 TABLET(0.5 MG) BY MOUTH TWICE DAILY AS NEEDED FOR ANXIETY  . clotrimazole (LOTRIMIN) 1 % cream Apply 1 application topically 2 (two) times daily as needed (FOR RASH). Prescribed for rash in groin  . dextromethorphan-guaiFENesin (MUCINEX DM) 30-600 MG 12hr tablet Take 1 tablet by mouth 2 (two) times daily.  . fluticasone (FLONASE) 50 MCG/ACT nasal spray  Place 2 sprays into both nostrils daily.  . furosemide (LASIX) 20 MG tablet Take one tablet daily for one week then take one table only on Tuesdays and Saturdays.  Marland Kitchen gabapentin (NEURONTIN) 300 MG capsule TAKE 1 TO 2 CAPSULES FOUR TIMES DAILY  AFTER MEALS AND AT BEDTIME.  . hydrALAZINE (APRESOLINE) 10 MG tablet TAKE 1 TABLET THREE TIMES DAILY AS NEEDED FOR BLOOD PRESSURE MORE THAN 150/90  . HYDROcodone-acetaminophen (NORCO/VICODIN) 5-325 MG tablet Take 1 tablet by mouth every 6 (six) hours as needed for severe pain.  . hydroxypropyl methylcellulose / hypromellose (ISOPTO TEARS / GONIOVISC) 2.5 % ophthalmic solution Place 1 drop into both eyes 2 (two) times daily.  . Insulin Pen Needle 29G X 10MM MISC Use as directed  . losartan (COZAAR) 100  MG tablet TAKE 1 TABLET EVERY DAY  . meclizine (ANTIVERT) 25 MG tablet TAKE 1 TABLET TWICE DAILY AS NEEDED FOR DIZZINESS  . methocarbamol (ROBAXIN) 500 MG tablet Take 500 mg by mouth 3 (three) times daily as needed for muscle spasms.  . nitroGLYCERIN (NITROSTAT) 0.4 MG SL tablet PLACE 1 TABLET UNDER THE TONGUE EVERY 5 MINUTES AS NEEDED FOR CHEST PAIN. MAX 3 DOSES. CALL 911 IF NO IMPROVEMENT AFTER 1ST DOSE  . Nutritional Supplements (JUICE PLUS FIBRE PO) Take 1 each 2 (two) times daily by mouth.  Marland Kitchen omeprazole (PRILOSEC) 40 MG capsule TAKE 1 CAPSULE TWICE DAILY  . ondansetron (ZOFRAN) 4 MG tablet Take 1 tablet (4 mg total) by mouth every 8 (eight) hours as needed for nausea or vomiting.  . potassium chloride (KLOR-CON) 10 MEQ tablet Take one tablet daily for 1 week then take one tablet on on tuesdays and saturdays.  . primidone (MYSOLINE) 250 MG tablet Take 1 tablet (250 mg total) by mouth 4 (four) times daily.  . simvastatin (ZOCOR) 40 MG tablet TAKE 1 AND 1/2 TABLETS AT BEDTIME  . tamsulosin (FLOMAX) 0.4 MG CAPS capsule Take 1 capsule (0.4 mg total) by mouth daily.  . timolol (TIMOPTIC) 0.5 % ophthalmic solution Place 1 drop into both eyes 2 (two) times daily.     . Travoprost, BAK Free, (TRAVATAN Z) 0.004 % SOLN ophthalmic solution Place 1 drop into both eyes at bedtime.  . triamcinolone (KENALOG) 0.025 % cream Apply 1 application topically 2 (two) times daily as needed (rash).      Allergies:   Morphine and related, Hydrochlorothiazide, Tape, Sulfur dioxide, Latex, Lisinopril, and Sulfa antibiotics   Social History   Socioeconomic History  . Marital status: Widowed    Spouse name: Not on file  . Number of children: 2  . Years of education: Not on file  . Highest education level: Not on file  Occupational History  . Occupation: retired from [employer]: RETIRED  Tobacco Use  . Smoking status: Former Smoker    Packs/day: 2.00    Years: 20.00    Pack years: 40.00    Types: Cigarettes    Quit date: 10/08/1982    Years since quitting: 37.4  . Smokeless tobacco: Never Used  Vaping Use  . Vaping Use: Never used  Substance and Sexual Activity  . Alcohol use: Yes    Comment: rare wine or beer occassionally  . Drug use: No  . Sexual activity: Never  Other Topics Concern  . Not on file  Social History Narrative  . Not on file   Social Determinants of Health   Financial Resource Strain:   . Difficulty of Paying Living Expenses:   Food Insecurity:   . Worried About Charity fundraiser in the Last Year:   . Arboriculturist in the Last Year:   Transportation Needs:   . Film/video editor (Medical):   Marland Kitchen Lack of Transportation (Non-Medical):   Physical Activity:   . Days of Exercise per Week:   . Minutes of Exercise per Session:   Stress:   . Feeling of Stress :   Social Connections:   . Frequency of Communication with Friends and Family:   . Frequency of Social Gatherings with Friends and Family:   . Attends Religious Services:   . Active Member of Clubs or Organizations:   . Attends Archivist Meetings:   Marland Kitchen Marital Status:  Family History: The patient's family history includes Cancer in his  brother, father, and sister; Diabetes in his sister; Heart attack in his mother.  ROS:   Review of Systems  Constitution: Negative for decreased appetite, fever and weight gain.  HENT: Negative for congestion, ear discharge, hoarse voice and sore throat.   Eyes: Negative for discharge, redness, vision loss in right eye and visual halos.  Cardiovascular: Negative for chest pain, dyspnea on exertion, leg swelling, orthopnea and palpitations.  Respiratory: Reports shortness of breath.  Negative for for cough, hemoptysis, and snoring.   Endocrine: Negative for heat intolerance and polyphagia.  Hematologic/Lymphatic: Negative for bleeding problem. Does not bruise/bleed easily.  Skin: Negative for flushing, nail changes, rash and suspicious lesions.  Musculoskeletal: Negative for arthritis, joint pain, muscle cramps, myalgias, neck pain and stiffness.  Gastrointestinal: Negative for abdominal pain, bowel incontinence, diarrhea and excessive appetite.  Genitourinary: Negative for decreased libido, genital sores and incomplete emptying.  Neurological: Negative for brief paralysis, focal weakness, headaches and loss of balance.  Psychiatric/Behavioral: Negative for altered mental status, depression and suicidal ideas.  Allergic/Immunologic: Negative for HIV exposure and persistent infections.    EKGs/Labs/Other Studies Reviewed:    The following studies were reviewed today:   EKG: None today  Echo  IMPRESSIONS 11/17/19  1. Left ventricular ejection fraction, by estimation, is 60 to 65%. The left ventricle has normal function. The left ventricle has no regional wall motion abnormalities. Left ventricular diastolic parameters are consistent with Grade I diastolic dysfunction (impaired relaxation).  2. Right ventricular systolic function is normal. The right ventricular size is normal. There is normal pulmonary artery systolic pressure.  3. Left atrial size was mild to moderately dilated.  4.  The mitral valve is normal in structure. No evidence of mitral valve regurgitation. No evidence of mitral stenosis.  5. The aortic valve is normal in structure. Aortic valve regurgitation is mild. Mild aortic valve sclerosis is present, with no evidence of aortic valve stenosis.  6. There is mild dilatation of the ascending aorta measuring 40 mm.  7. The inferior vena cava is normal in size with greater than 50% respiratory variability, suggesting right atrial pressure of 3 mmHg.   Pharmacologic nuclear stress test  Nuclear stress EF: 61%.  There was no ST segment deviation noted during stress.  The study is normal.  This is a low risk study.  The left ventricular ejection fraction is normal (55-65%).   Normal stress nuclear study with no ischemia or infarction.  Gated ejection fraction 61% with normal wall motion.  Recent Labs: 06/24/2019: TSH 3.78 10/24/2019: B Natriuretic Peptide 92.6 10/31/2019: ALT 41; Pro B Natriuretic peptide (BNP) 118.0 12/16/2019: Magnesium 2.0 01/18/2020: Hemoglobin 14.9; Platelets 211 01/23/2020: BUN 18; Creat 1.13; Potassium 4.0; Sodium 141  Recent Lipid Panel    Component Value Date/Time   CHOL 164 06/24/2019 1204   TRIG 201 (H) 08/16/2019 2200   HDL 48.80 06/24/2019 1204   CHOLHDL 3 06/24/2019 1204   VLDL 49.6 (H) 06/24/2019 1204   LDLCALC 65 10/14/2017 1047   LDLDIRECT 86.0 06/24/2019 1204    Physical Exam:    VS:  BP 110/70 (BP Location: Right Arm, Patient Position: Sitting, Cuff Size: Normal)   Pulse 64   Ht 5\' 6"  (1.676 m)   Wt 210 lb (95.3 kg)   SpO2 92%   BMI 33.89 kg/m     Wt Readings from Last 3 Encounters:  02/22/20 210 lb (95.3 kg)  01/23/20 216 lb (98 kg)  01/02/20 217 lb (98.4 kg)     GEN: Well nourished, well developed in no acute distress HEENT: Normal NECK: No JVD; No carotid bruits LYMPHATICS: No lymphadenopathy CARDIAC: S1S2 noted,RRR, no murmurs, rubs, gallops RESPIRATORY:  Clear to auscultation without rales,  wheezing or rhonchi  ABDOMEN: Soft, non-tender, non-distended, +bowel sounds, no guarding. EXTREMITIES: No edema, No cyanosis, no clubbing MUSCULOSKELETAL:  No deformity  SKIN: Warm and dry NEUROLOGIC:  Alert and oriented x 3, non-focal PSYCHIATRIC:  Normal affect, good insight  ASSESSMENT:    1. CAD in native artery   2. Shortness of breath   3. RENAL ARTERY STENOSIS   4. Pulmonary hypertension ([place])   5. Essential hypertension   6. Stage 3 chronic kidney disease, unspecified whether stage 3a or 3b CKD   7. Mixed hyperlipidemia   8. Obesity (BMI 30-39.9)   9. Dilated aortic root (HCC)    PLAN:    1.  His shortness of breath is still occurring.  I did discuss with the patient about his testing results.  He has recently had a pulmonary function test with pulmonary which this week he tells me he is going to follow-up at the [place] to discuss these results.  We will continue to monitor the patient.  If this progress will reassess the need for any further diagnostic testing.  For now his nuclear stress test is normal for any progression of coronary artery disease.  2.  Coronary disease stable-continue patient on current medical regimen.  Recent stress test did not show any ischemia.  We will continue to monitor  3.  Hypertension continue current medication regiment blood pressure stable.  Recent LDL 86 his target should be less than 70.  He notes that he has had some change in his diet we will repeat this number and if is still elevated I plan to add Zetia to his regimen.  4.  Obesity-the patient understands the need to lose weight with diet and exercise. We have discussed specific strategies for this.  4.  Hyperlipidemia continue simvastatin 40 mg daily.  5.  Proximal dilated aortic aneurysm we will continue to monitor repeat imaging in 1 year.  6.  History of renal artery stenosis continue to monitor patient.  7.  Chronic kidney disease avoid nephrotoxins.  The patient is in agreement  with the above plan. The patient left the office in stable condition.  The patient will follow up in   Medication Adjustments/Labs and Tests Ordered: Current medicines are reviewed at length with the patient today.  Concerns regarding medicines are outlined above.  No orders of the defined types were placed in this encounter.  No orders of the defined types were placed in this encounter.   Patient Instructions  Medication Instructions:  Your physician recommends that you continue on your current medications as directed. Please refer to the Current Medication list given to you today.  *If you need a refill on your cardiac medications before your next appointment, please call your pharmacy*   Lab Work: None If you have labs (blood work) drawn today and your tests are completely normal, you will receive your results only by: Marland Kitchen MyChart Message (if you have MyChart) OR . A paper copy in the mail If you have any lab test that is abnormal or we need to change your treatment, we will call you to review the results.   Testing/Procedures: None   Follow-Up: At [facility], you and your health needs are our priority.  As part  of our continuing mission to provide you with exceptional heart care, we have created designated Provider Care Teams.  These Care Teams include your primary Cardiologist (physician) and Advanced Practice Providers (APPs -  Physician Assistants and Nurse Practitioners) who all work together to provide you with the care you need, when you need it.  We recommend signing up for the patient portal called "MyChart".  Sign up information is provided on this After Visit Summary.  MyChart is used to connect with patients for Virtual Visits (Telemedicine).  Patients are able to view lab/test results, encounter notes, upcoming appointments, etc.  Non-urgent messages can be sent to your provider as well.   To learn more about what you can do with MyChart, go to  [email or web address].    Your next appointment:   6 month(s)  The format for your next appointment:   In Person  Provider:   Berniece Salines, DO   Other Instructions      Adopting a Healthy Lifestyle.  Know what a healthy weight is for you (roughly BMI <25) and aim to maintain this   Aim for 7+ servings of fruits and vegetables daily   65-80+ fluid ounces of water or unsweet tea for healthy kidneys   Limit to max 1 drink of alcohol per day; avoid smoking/tobacco   Limit animal fats in diet for cholesterol and heart health - choose grass fed whenever available   Avoid highly processed foods, and foods high in saturated/trans fats   Aim for low stress - take time to unwind and care for your mental health   Aim for 150 min of moderate intensity exercise weekly for heart health, and weights twice weekly for bone health   Aim for 7-9 hours of sleep daily   When it comes to diets, agreement about the perfect plan isnt easy to find, even among the experts. Experts at the [place] developed an idea known as the Healthy Eating Plate. Just imagine a plate divided into logical, healthy portions.   The emphasis is on diet quality:   Load up on vegetables and fruits - one-half of your plate: Aim for color and variety, and remember that potatoes dont count.   Go for whole grains - one-quarter of your plate: Whole wheat, barley, wheat berries, quinoa, oats, brown rice, and foods made with them. If you want pasta, go with whole wheat pasta.   Protein power - one-quarter of your plate: Fish, chicken, beans, and nuts are all healthy, versatile protein sources. Limit red meat.   The diet, however, does go beyond the plate, offering a few other suggestions.   Use healthy plant oils, such as olive, canola, soy, corn, sunflower and peanut. Check the labels, and avoid partially hydrogenated oil, which have unhealthy trans fats.   If youre thirsty, drink water.  Coffee and tea are good in moderation, but skip sugary drinks and limit milk and dairy products to one or two daily servings.   The type of carbohydrate in the diet is more important than the amount. Some sources of carbohydrates, such as vegetables, fruits, whole grains, and beans-are healthier than others.   Finally, stay active  Signed, Berniece Salines, DO  02/22/2020 1:41 PM    [facility]

## 2020-02-29 DIAGNOSIS — S32019D Unspecified fracture of first lumbar vertebra, subsequent encounter for fracture with routine healing: Secondary | ICD-10-CM | POA: Diagnosis not present

## 2020-03-03 ENCOUNTER — Other Ambulatory Visit: Payer: Self-pay | Admitting: Internal Medicine

## 2020-03-16 ENCOUNTER — Ambulatory Visit: Payer: Medicare Other | Admitting: Neurology

## 2020-03-28 DIAGNOSIS — M545 Low back pain: Secondary | ICD-10-CM | POA: Diagnosis not present

## 2020-04-04 DIAGNOSIS — H4720 Unspecified optic atrophy: Secondary | ICD-10-CM | POA: Diagnosis not present

## 2020-04-04 DIAGNOSIS — H401133 Primary open-angle glaucoma, bilateral, severe stage: Secondary | ICD-10-CM | POA: Diagnosis not present

## 2020-04-14 ENCOUNTER — Other Ambulatory Visit: Payer: Self-pay | Admitting: Internal Medicine

## 2020-04-17 ENCOUNTER — Ambulatory Visit (INDEPENDENT_AMBULATORY_CARE_PROVIDER_SITE_OTHER): Payer: Medicare Other | Admitting: Podiatry

## 2020-04-17 ENCOUNTER — Other Ambulatory Visit: Payer: Self-pay

## 2020-04-17 DIAGNOSIS — M79676 Pain in unspecified toe(s): Secondary | ICD-10-CM

## 2020-04-17 DIAGNOSIS — B351 Tinea unguium: Secondary | ICD-10-CM | POA: Diagnosis not present

## 2020-04-19 DIAGNOSIS — M5136 Other intervertebral disc degeneration, lumbar region: Secondary | ICD-10-CM | POA: Diagnosis not present

## 2020-04-19 DIAGNOSIS — M5416 Radiculopathy, lumbar region: Secondary | ICD-10-CM | POA: Diagnosis not present

## 2020-04-21 ENCOUNTER — Encounter: Payer: Self-pay | Admitting: Podiatry

## 2020-04-21 NOTE — Progress Notes (Signed)
Subjective:  Patient ID: [name], male    DOB: December 14, 1935,  MRN: [id number]  84 y.o. male presents with painful thick toenails that are difficult to trim. Pain interferes with ambulation. Aggravating factors include wearing enclosed shoe gear. Pain is relieved with periodic professional debridement.Marland Kitchen    He voices no new pedal problems on today's visit.  Review of Systems: Negative except as noted in the HPI. Denies N/V/F/Ch.  Past Medical History:  Diagnosis Date  . Acute bronchitis per pt no fever--  cough since discharge from hospital 07-02-2015   per pcp note 07-05-2015  mild to moderate bronchitis vs pna  . Allergic rhinitis   . Bladder cancer ([place])   . BPH (benign prostatic hyperplasia)   . Bradycardia   . CAD (coronary artery disease)   . Chronic low back pain   . DDD (degenerative disc disease), lumbosacral   . Depression   . Diverticulosis of colon (without mention of hemorrhage)   . Dyspnea   . Essential tremor   . Fatty liver disease, nonalcoholic   . GERD (gastroesophageal reflux disease)   . Glaucoma   . History of bladder cancer    2004--  TCC low-grade , non-invasive  . History of colon polyps   . History of exercise stress test    02-05-2007-- ETT (Clinically positive, electrically equivocal, submaximal ETT,  appropriate bp response to exercise  . History of hiatal hernia   . History of kidney stones   . History of peptic ulcer   . HLD (hyperlipidemia)   . HTN (hypertension)   . Nocturia   . Productive cough   . Rheumatoid arthritis(714.0)   . Right renal artery stenosis (HCC)    per cath 03-14-2009 --- 40-50%  . RLS (restless legs syndrome)   . Vertigo, intermittent   . Weak urinary stream   . Wears dentures    upper  . Wears glasses   . Wears hearing aid    bilateral  . Wears partial dentures    lower   Past Surgical History:  Procedure Laterality Date  . CARDIAC CATHETERIZATION  02-12-2007  dr gregg taylor   Non-obstructive CAD,  20%  pLCX,  20% LAD, ef 65%  . CARDIAC CATHETERIZATION  03-14-2009  dr Johnsie Cancel   pLAD and mid diaginal 20-30% multiple lesions,  OM1 20%, right renal ostial 40-50%,  ef 60%  . CATARACT EXTRACTION W/ INTRAOCULAR LENS  IMPLANT, BILATERAL  2016  . CYSTOSCOPY WITH BIOPSY N/A 07/11/2015   Procedure: CYSTOSCOPY WITH COLD CUP RESECTION AND FULGERATION;  Surgeon: Rana Snare, MD;  Location: [facility];  Service: Urology;  Laterality: N/A;  . HIP ARTHROSCOPY W/ LABRAL DEBRIDEMENT Left 06-29-2000   and chondraplasty  . LUMBAR [place] SURGERY  07-16-2005  . LUMBAR LAMINECTOMY/DECOMPRESSION MICRODISCECTOMY  07/09/2011   Procedure: LUMBAR LAMINECTOMY/DECOMPRESSION MICRODISCECTOMY;  Surgeon: Johnn Hai;  Location: [facility];  Service: Orthopedics;  Laterality: Left;  Decompression L5 - S1 on the Left and repair of dura  . LUMBAR LAMINECTOMY/DECOMPRESSION MICRODISCECTOMY N/A 07/27/2013   Procedure: DECOMPRESSION L4 - L5 WITH EXCISION OF SYNOVIAL CYST AND, LATERAL MASS FUSION 1 LEVEL;  Surgeon: Johnn Hai, MD;  Location: [facility];  Service: Orthopedics;  Laterality: N/A;  . LUMBAR LAMINECTOMY/DECOMPRESSION MICRODISCECTOMY Left 07/06/2014   Procedure:  MICRO LUMBAR DECOMPRESSION L5-S1 ON LEFT, L4-5 REDO;  Surgeon: Johnn Hai, MD;  Location: [facility];  Service: Orthopedics;  Laterality: Left;  . SPINAL CORD STIMULATOR INSERTION N/A 09/11/2016  Procedure: Spinal cord stimulator placement;  Surgeon: Melina Schools, MD;  Location: [facility];  Service: Orthopedics;  Laterality: N/A;  . TONSILLECTOMY AND ADENOIDECTOMY    . TRANSTHORACIC ECHOCARDIOGRAM  09-20-2010   normal LVF, ef 55-65%/  mild MR, TR and PR/  mild LAE  . TRANSURETHRAL RESECTION OF BLADDER TUMOR  09-21-2002   Patient Active Problem List   Diagnosis Date Noted  . AKI (acute kidney injury) ([place]) 01/23/2020  . Chest pain of uncertain etiology 32/20/2542  . Essential hypertension 11/12/2019  . Dizziness 11/12/2019  . Obesity (BMI 30-39.9)  11/12/2019  . History of COVID-19 10/03/2019  . Acute respiratory failure with hypoxia ([place]) 10/03/2019  . Pneumonia due to COVID-19 virus 08/16/2019  . Acute respiratory failure due to COVID-19 ([place]) 08/16/2019  . COVID-19 virus infection 08/09/2019  . Memory loss 06/24/2019  . Wheezing 06/24/2019  . Abrasion of left arm 04/11/2019  . Joint swelling of lower leg 11/29/2018  . Abnormal urinalysis 02/25/2018  . Anxiety 01/08/2018  . Laceration of hand 12/18/2017  . Pain of left hand 12/18/2017  . Laceration of scalp without foreign body 06/09/2017  . Nonintractable headache 06/09/2017  . Acute sinus infection 04/14/2017  . Nausea and vomiting 03/27/2017  . Hyponatremia 03/17/2017  . Right lumbar radiculopathy 01/21/2017  . Fall 01/21/2017  . Synovial cyst 01/21/2017  . Chronic pain 09/11/2016  . Generalized weakness 05/22/2016  . UTI (urinary tract infection) 05/22/2016  . Weakness 05/22/2016  . Fever blister 10/18/2015  . Dyspnea 10/17/2015  . Bradycardia 09/17/2015  . Chest pain at rest 09/17/2015  . CAD in native artery 09/17/2015  . CHF (congestive heart failure) ([place]) 09/17/2015  . Pulmonary hypertension ([place]) 09/17/2015  . Bladder cancer ([place]) 09/17/2015  . Essential tremor 09/17/2015  . Chest pain 09/17/2015  . Anginal chest pain at rest [facility]) 09/17/2015  . Hypokalemia 07/13/2015  . Vertigo 07/01/2015  . Dizzinesses 07/01/2015  . Spinal stenosis of lumbar region at multiple levels 07/06/2014  . Angular cheilitis 05/05/2014  . Left lumbar radiculopathy 05/04/2014  . Right elbow pain 12/29/2013  . Lateral epicondylitis of right elbow 12/29/2013  . Cough 09/14/2013  . Spinal stenosis, lumbar 07/27/2013  . Palpable mass of neck 07/21/2013  . Vasovagal episode 01/10/2013  . Left otitis media 10/28/2012  . Impaired glucose tolerance 10/28/2012  . Cellulitis 04/24/2012  . Hemorrhoids 04/24/2012  . Syncope 04/21/2012  . Preventative health care 11/28/2010  .  Depression 09/12/2010  . RENAL ARTERY STENOSIS 09/12/2010  . FATIGUE 09/12/2010  . BUNION, RIGHT FOOT 02/05/2010  . RECTAL BLEEDING 07/10/2009  . Diarrhea 07/10/2009  . DIVERTICULOSIS OF COLON 03/28/2009  . FATTY LIVER DISEASE 03/28/2009  . BRADYCARDIA 03/20/2009  . DIZZINESS 10/17/2008  . CHEST PAIN 10/17/2008  . Malignant neoplasm of bladder ([place]) 02/07/2008  . Allergic rhinitis 02/07/2008  . COLONIC POLYPS, HX OF 02/07/2008  . Hyperlipidemia 11/24/2007  . GERD 11/24/2007  . HIATAL HERNIA WITH REFLUX 11/24/2007  . CKD (chronic kidney disease) stage 3, GFR 30-59 ml/min (HCC) 11/24/2007  . Rheumatoid arthritis ([place]) 11/24/2007  . [place] DISEASE, LUMBAR 11/24/2007  . ADENOIDECTOMY, HX OF 11/24/2007    Current Outpatient Medications:  .  acetaminophen (TYLENOL) 500 MG tablet, Take 1,000 mg by mouth every 6 (six) hours as needed for moderate pain., Disp: , Rfl:  .  albuterol (PROVENTIL) (2.5 MG/3ML) 0.083% nebulizer solution, Take 3 mLs (2.5 mg total) by nebulization every 6 (six) hours as needed for wheezing or shortness of breath., Disp:  150 mL, Rfl: 1 .  albuterol (VENTOLIN HFA) 108 (90 Base) MCG/ACT inhaler, Inhale 2 puffs into the lungs every 6 (six) hours as needed for wheezing or shortness of breath., Disp: 18 g, Rfl: 11 .  amLODipine (NORVASC) 10 MG tablet, TAKE 1 TABLET (10 MG TOTAL) DAILY., Disp: 90 tablet, Rfl: 1 .  ascorbic acid (VITAMIN C) 500 MG tablet, Take 1 tablet (500 mg total) by mouth daily., Disp: 30 tablet, Rfl: 0 .  brimonidine (ALPHAGAN) 0.15 % ophthalmic solution, Place 1 drop into both eyes 2 (two) times daily., Disp: , Rfl:  .  budesonide-formoterol (SYMBICORT) 160-4.5 MCG/ACT inhaler, Inhale 2 puffs into the lungs 2 (two) times daily., Disp: 1 Inhaler, Rfl: 3 .  cetirizine (ZYRTEC) 10 MG tablet, Take 10 mg by mouth daily., Disp: , Rfl:  .  cholecalciferol (VITAMIN D3) 25 MCG (1000 UNIT) tablet, Take 1,000 Units by mouth daily., Disp: , Rfl:  .  citalopram  (CELEXA) 20 MG tablet, Take 1 tablet (20 mg total) by mouth daily., Disp: 90 tablet, Rfl: 3 .  clotrimazole (LOTRIMIN) 1 % cream, Apply 1 application topically 2 (two) times daily as needed (FOR RASH). Prescribed for rash in groin, Disp: , Rfl:  .  dextromethorphan-guaiFENesin (MUCINEX DM) 30-600 MG 12hr tablet, Take 1 tablet by mouth 2 (two) times daily., Disp: , Rfl:  .  fluticasone (FLONASE) 50 MCG/ACT nasal spray, USE 2 SPRAYS IN EACH NOSTRIL DAILY, Disp: 48 g, Rfl: 1 .  furosemide (LASIX) 20 MG tablet, Take one tablet daily for one week then take one table only on Tuesdays and Saturdays., Disp: 30 tablet, Rfl: 1 .  gabapentin (NEURONTIN) 300 MG capsule, TAKE 1 TO 2 CAPSULES FOUR TIMES DAILY  AFTER MEALS AND AT BEDTIME., Disp: 720 capsule, Rfl: 3 .  hydrALAZINE (APRESOLINE) 10 MG tablet, TAKE 1 TABLET THREE TIMES DAILY AS NEEDED FOR BLOOD PRESSURE MORE THAN 150/90, Disp: 270 tablet, Rfl: 0 .  HYDROcodone-acetaminophen (NORCO/VICODIN) 5-325 MG tablet, Take 1 tablet by mouth every 6 (six) hours as needed for severe pain., Disp: 10 tablet, Rfl: 0 .  hydroxypropyl methylcellulose / hypromellose (ISOPTO TEARS / GONIOVISC) 2.5 % ophthalmic solution, Place 1 drop into both eyes 2 (two) times daily., Disp: , Rfl:  .  Insulin Pen Needle 29G X 10MM MISC, Use as directed, Disp: 200 each, Rfl: 0 .  LORazepam (ATIVAN) 1 MG tablet, , Disp: , Rfl:  .  losartan (COZAAR) 100 MG tablet, TAKE 1 TABLET EVERY DAY, Disp: 90 tablet, Rfl: 2 .  meclizine (ANTIVERT) 25 MG tablet, TAKE 1 TABLET TWICE DAILY AS NEEDED FOR DIZZINESS, Disp: 180 tablet, Rfl: 1 .  methocarbamol (ROBAXIN) 500 MG tablet, Take 500 mg by mouth 3 (three) times daily as needed for muscle spasms., Disp: , Rfl:  .  nitroGLYCERIN (NITROSTAT) 0.4 MG SL tablet, PLACE 1 TABLET UNDER THE TONGUE EVERY 5 MINUTES AS NEEDED FOR CHEST PAIN. MAX 3 DOSES. CALL 911 IF NO IMPROVEMENT AFTER 1ST DOSE, Disp: 25 tablet, Rfl: 0 .  Nutritional Supplements (JUICE PLUS FIBRE  PO), Take 1 each 2 (two) times daily by mouth., Disp: , Rfl:  .  omeprazole (PRILOSEC) 40 MG capsule, TAKE 1 CAPSULE TWICE DAILY, Disp: 180 capsule, Rfl: 3 .  ondansetron (ZOFRAN) 4 MG tablet, TAKE 1 TABLET EVERY 8 HOURS AS NEEDED FOR NAUSEA OR VOMITING, Disp: 30 tablet, Rfl: 2 .  potassium chloride (KLOR-CON) 10 MEQ tablet, Take one tablet daily for 1 week then take one tablet on on  tuesdays and saturdays., Disp: 30 tablet, Rfl: 1 .  predniSONE (STERAPRED UNI-PAK 21 TAB) 5 MG (21) TBPK tablet, , Disp: , Rfl:  .  primidone (MYSOLINE) 250 MG tablet, Take 1 tablet (250 mg total) by mouth 4 (four) times daily., Disp: 360 tablet, Rfl: 3 .  simvastatin (ZOCOR) 40 MG tablet, TAKE 1 AND 1/2 TABLETS AT BEDTIME, Disp: 135 tablet, Rfl: 1 .  tamsulosin (FLOMAX) 0.4 MG CAPS capsule, Take 1 capsule (0.4 mg total) by mouth daily., Disp: 90 capsule, Rfl: 3 .  timolol (TIMOPTIC) 0.5 % ophthalmic solution, Place 1 drop into both eyes 2 (two) times daily. , Disp: , Rfl: 1 .  Travoprost, BAK Free, (TRAVATAN Z) 0.004 % SOLN ophthalmic solution, Place 1 drop into both eyes at bedtime., Disp: 5 mL, Rfl: 5 .  triamcinolone (KENALOG) 0.025 % cream, Apply 1 application topically 2 (two) times daily as needed (rash). , Disp: , Rfl:  Allergies  Allergen Reactions  . Morphine And Related Other (See Comments)    Migraine   . Hydrochlorothiazide Other (See Comments)    REACTION: hyponatremia with daily use  . Tape Itching    Patient  Is ok to use paper tape  . Sulfur Dioxide   . Latex Rash  . Lisinopril Cough  . Sulfa Antibiotics Rash   Social History   Tobacco Use  Smoking Status Former Smoker  . Packs/day: 2.00  . Years: 20.00  . Pack years: 40.00  . Types: Cigarettes  . Quit date: 10/08/1982  . Years since quitting: 37.5  Smokeless Tobacco Never Used   Objective:   Constitutional Well developed. Well nourished.  Vascular Dorsalis pedis pulses palpable bilaterally. Posterior tibial pulses palpable  bilaterally. Capillary refill normal to all digits.  No cyanosis or clubbing noted. Pedal hair growth sparse b/l.  Neurologic Normal speech. Oriented to person, place, and time. Epicritic sensation to light touch grossly present bilaterally.  Dermatologic Nails elongated dystrophic, and discolored x 10  pain to palpation No open wounds. No skin lesions. No interdigital macerations.  Orthopedic: Normal joint ROM without pain or crepitus bilaterally. Patient has on back brace. Utilizes rollator for gait assistance. No visible deformities. No bony tenderness.   Radiographs: None Assessment:   1. Pain due to onychomycosis of toenail    Plan:  Patient was evaluated and treated and all questions answered. Onychomycosis with pain -Nails palliatively debridement as below. -Educated on self-care  Procedure: Nail Debridement Rationale: Pain Type of Debridement: manual, sharp debridement. Instrumentation: Nail nipper, rotary burr. Number of Nails: 10  -Toenails 1-5 b/l were debrided in length and girth with sterile nail nippers and dremel without iatrogenic bleeding.  -Patient to report any pedal injuries to medical professional immediately. -Patient to continue soft, supportive shoe gear daily. -Patient/POA to call should there be question/concern in the interim.  Return in about 3 months (around 07/18/2020).  Marzetta Board, DPM

## 2020-04-25 ENCOUNTER — Encounter: Payer: Self-pay | Admitting: Internal Medicine

## 2020-04-25 ENCOUNTER — Ambulatory Visit (INDEPENDENT_AMBULATORY_CARE_PROVIDER_SITE_OTHER): Payer: Medicare Other | Admitting: Internal Medicine

## 2020-04-25 ENCOUNTER — Other Ambulatory Visit: Payer: Self-pay

## 2020-04-25 VITALS — BP 140/78 | HR 56 | Temp 98.1°F | Ht 66.0 in

## 2020-04-25 DIAGNOSIS — R7302 Impaired glucose tolerance (oral): Secondary | ICD-10-CM

## 2020-04-25 DIAGNOSIS — N1831 Chronic kidney disease, stage 3a: Secondary | ICD-10-CM | POA: Diagnosis not present

## 2020-04-25 DIAGNOSIS — Z23 Encounter for immunization: Secondary | ICD-10-CM

## 2020-04-25 DIAGNOSIS — I701 Atherosclerosis of renal artery: Secondary | ICD-10-CM

## 2020-04-25 DIAGNOSIS — I700 Atherosclerosis of aorta: Secondary | ICD-10-CM

## 2020-04-25 DIAGNOSIS — I10 Essential (primary) hypertension: Secondary | ICD-10-CM | POA: Diagnosis not present

## 2020-04-25 DIAGNOSIS — E782 Mixed hyperlipidemia: Secondary | ICD-10-CM

## 2020-04-25 NOTE — Patient Instructions (Addendum)
You had the flu shot today  Please remember to get the booster shot soon  Please continue all other medications as before, and refills have been done if requested.  Please have the pharmacy call with any other refills you may need.  Please continue your efforts at being more active, low cholesterol diet, and weight control.  You are otherwise up to date with prevention measures today.  Please keep your appointments with your specialists as you may have planned  Please go to the LAB at the blood drawing area for the tests to be done  You will be contacted by phone if any changes need to be made immediately.  Otherwise, you will receive a letter about your results with an explanation, but please check with MyChart first.  Please remember to sign up for MyChart if you have not done so, as this will be important to you in the future with finding out test results, communicating by private email, and scheduling acute appointments online when needed.  Please make an Appointment to return in 6 months, or sooner if needed

## 2020-04-25 NOTE — Progress Notes (Signed)
Subjective:    Patient ID: [name], male    DOB: September 04, 1935, 84 y.o.   MRN: [id number]  HPI Here to f/u; overall doing ok,  Pt denies chest pain, increasing sob or doe, wheezing, orthopnea, PND, increased LE swelling, palpitations, dizziness or syncope.  Pt denies new neurological symptoms such as new headache, or facial or extremity weakness or numbness.  Pt denies polydipsia, polyuria, or low sugar episode.  Pt states overall good compliance with meds, mostly trying to follow appropriate diet, with wt overall stable,  but little exercise however. Pulm rehab to start Friday.  For sleep apnea testing soon. No new complaints Past Medical History:  Diagnosis Date  . Acute bronchitis per pt no fever--  cough since discharge from hospital 07-02-2015   per pcp note 07-05-2015  mild to moderate bronchitis vs pna  . Allergic rhinitis   . Bladder cancer ([place])   . BPH (benign prostatic hyperplasia)   . Bradycardia   . CAD (coronary artery disease)   . Chronic low back pain   . DDD (degenerative disc disease), lumbosacral   . Depression   . Diverticulosis of colon (without mention of hemorrhage)   . Dyspnea   . Essential tremor   . Fatty liver disease, nonalcoholic   . GERD (gastroesophageal reflux disease)   . Glaucoma   . History of bladder cancer    2004--  TCC low-grade , non-invasive  . History of colon polyps   . History of exercise stress test    02-05-2007-- ETT (Clinically positive, electrically equivocal, submaximal ETT,  appropriate bp response to exercise  . History of hiatal hernia   . History of kidney stones   . History of peptic ulcer   . HLD (hyperlipidemia)   . HTN (hypertension)   . Nocturia   . Productive cough   . Rheumatoid arthritis(714.0)   . Right renal artery stenosis ([facility])    per cath 03-14-2009 --- 40-50%  . RLS (restless legs syndrome)   . Vertigo, intermittent   . Weak urinary stream   . Wears dentures    upper  . Wears glasses   . Wears hearing  aid    bilateral  . Wears partial dentures    lower   Past Surgical History:  Procedure Laterality Date  . CARDIAC CATHETERIZATION  02-12-2007  dr gregg taylor   Non-obstructive CAD,  20% pLCX,  20% LAD, ef 65%  . CARDIAC CATHETERIZATION  03-14-2009  dr Johnsie Cancel   pLAD and mid diaginal 20-30% multiple lesions,  OM1 20%, right renal ostial 40-50%,  ef 60%  . CATARACT EXTRACTION W/ INTRAOCULAR LENS  IMPLANT, BILATERAL  2016  . CYSTOSCOPY WITH BIOPSY N/A 07/11/2015   Procedure: CYSTOSCOPY WITH COLD CUP RESECTION AND FULGERATION;  Surgeon: Rana Snare, MD;  Location: [facility];  Service: Urology;  Laterality: N/A;  . HIP ARTHROSCOPY W/ LABRAL DEBRIDEMENT Left 06-29-2000   and chondraplasty  . LUMBAR [place] SURGERY  07-16-2005  . LUMBAR LAMINECTOMY/DECOMPRESSION MICRODISCECTOMY  07/09/2011   Procedure: LUMBAR LAMINECTOMY/DECOMPRESSION MICRODISCECTOMY;  Surgeon: Johnn Hai;  Location: [facility];  Service: Orthopedics;  Laterality: Left;  Decompression L5 - S1 on the Left and repair of dura  . LUMBAR LAMINECTOMY/DECOMPRESSION MICRODISCECTOMY N/A 07/27/2013   Procedure: DECOMPRESSION L4 - L5 WITH EXCISION OF SYNOVIAL CYST AND, LATERAL MASS FUSION 1 LEVEL;  Surgeon: Johnn Hai, MD;  Location: [facility];  Service: Orthopedics;  Laterality: N/A;  . LUMBAR LAMINECTOMY/DECOMPRESSION MICRODISCECTOMY Left 07/06/2014  Procedure:  MICRO LUMBAR DECOMPRESSION L5-S1 ON LEFT, L4-5 REDO;  Surgeon: Johnn Hai, MD;  Location: [facility];  Service: Orthopedics;  Laterality: Left;  . SPINAL CORD STIMULATOR INSERTION N/A 09/11/2016   Procedure: Spinal cord stimulator placement;  Surgeon: Melina Schools, MD;  Location: [facility];  Service: Orthopedics;  Laterality: N/A;  . TONSILLECTOMY AND ADENOIDECTOMY    . TRANSTHORACIC ECHOCARDIOGRAM  09-20-2010   normal LVF, ef 55-65%/  mild MR, TR and PR/  mild LAE  . TRANSURETHRAL RESECTION OF BLADDER TUMOR  09-21-2002    reports that he quit smoking about 37  years ago. His smoking use included cigarettes. He has a 40.00 pack-year smoking history. He has never used smokeless tobacco. He reports current alcohol use. He reports that he does not use drugs. family history includes Cancer in his brother, father, and sister; Diabetes in his sister; Heart attack in his mother. Allergies  Allergen Reactions  . Morphine And Related Other (See Comments)    Migraine   . Sulfa Antibiotics Rash and Itching    Patient  Is ok to use paper tape  . Hydrochlorothiazide Other (See Comments)    REACTION: hyponatremia with daily use  . Tape Itching    Patient  Is ok to use paper tape  . Sulfur Dioxide   . Latex Rash  . Lisinopril Cough   Current Outpatient Medications on File Prior to Visit  Medication Sig Dispense Refill  . acetaminophen (TYLENOL) 500 MG tablet Take 1,000 mg by mouth every 6 (six) hours as needed for moderate pain.    Marland Kitchen albuterol (PROVENTIL) (2.5 MG/3ML) 0.083% nebulizer solution Take 3 mLs (2.5 mg total) by nebulization every 6 (six) hours as needed for wheezing or shortness of breath. 150 mL 1  . albuterol (VENTOLIN HFA) 108 (90 Base) MCG/ACT inhaler Inhale 2 puffs into the lungs every 6 (six) hours as needed for wheezing or shortness of breath. 18 g 11  . amLODipine (NORVASC) 10 MG tablet TAKE 1 TABLET (10 MG TOTAL) DAILY. 90 tablet 1  . ascorbic acid (VITAMIN C) 500 MG tablet Take 1 tablet (500 mg total) by mouth daily. 30 tablet 0  . brimonidine (ALPHAGAN) 0.15 % ophthalmic solution Place 1 drop into both eyes 2 (two) times daily.    . budesonide-formoterol (SYMBICORT) 160-4.5 MCG/ACT inhaler Inhale 2 puffs into the lungs 2 (two) times daily. 1 Inhaler 3  . cetirizine (ZYRTEC) 10 MG tablet Take 10 mg by mouth daily.    . cholecalciferol (VITAMIN D3) 25 MCG (1000 UNIT) tablet Take 1,000 Units by mouth daily.    . citalopram (CELEXA) 20 MG tablet Take 1 tablet (20 mg total) by mouth daily. 90 tablet 3  . clonazePAM (KLONOPIN) 0.5 MG tablet       . clotrimazole (LOTRIMIN) 1 % cream Apply 1 application topically 2 (two) times daily as needed (FOR RASH). Prescribed for rash in groin    . dextromethorphan-guaiFENesin (MUCINEX DM) 30-600 MG 12hr tablet Take 1 tablet by mouth 2 (two) times daily.    . fluticasone (FLONASE) 50 MCG/ACT nasal spray USE 2 SPRAYS IN EACH NOSTRIL DAILY 48 g 1  . furosemide (LASIX) 20 MG tablet Take one tablet daily for one week then take one table only on Tuesdays and Saturdays. 30 tablet 1  . gabapentin (NEURONTIN) 300 MG capsule TAKE 1 TO 2 CAPSULES FOUR TIMES DAILY  AFTER MEALS AND AT BEDTIME. 720 capsule 3  . hydrALAZINE (APRESOLINE) 10 MG tablet TAKE 1 TABLET  THREE TIMES DAILY AS NEEDED FOR BLOOD PRESSURE MORE THAN 150/90 270 tablet 0  . HYDROcodone-acetaminophen (NORCO/VICODIN) 5-325 MG tablet Take 1 tablet by mouth every 6 (six) hours as needed for severe pain. 10 tablet 0  . hydroxypropyl methylcellulose / hypromellose (ISOPTO TEARS / GONIOVISC) 2.5 % ophthalmic solution Place 1 drop into both eyes 2 (two) times daily.    . Insulin Pen Needle 29G X 10MM MISC Use as directed 200 each 0  . LORazepam (ATIVAN) 1 MG tablet     . losartan (COZAAR) 100 MG tablet TAKE 1 TABLET EVERY DAY 90 tablet 2  . meclizine (ANTIVERT) 25 MG tablet TAKE 1 TABLET TWICE DAILY AS NEEDED FOR DIZZINESS 180 tablet 1  . methocarbamol (ROBAXIN) 500 MG tablet Take 500 mg by mouth 3 (three) times daily as needed for muscle spasms.    . nitroGLYCERIN (NITROSTAT) 0.4 MG SL tablet PLACE 1 TABLET UNDER THE TONGUE EVERY 5 MINUTES AS NEEDED FOR CHEST PAIN. MAX 3 DOSES. CALL 911 IF NO IMPROVEMENT AFTER 1ST DOSE 25 tablet 0  . Nutritional Supplements (JUICE PLUS FIBRE PO) Take 1 each 2 (two) times daily by mouth.    Marland Kitchen omeprazole (PRILOSEC) 40 MG capsule TAKE 1 CAPSULE TWICE DAILY 180 capsule 3  . ondansetron (ZOFRAN) 4 MG tablet TAKE 1 TABLET EVERY 8 HOURS AS NEEDED FOR NAUSEA OR VOMITING 30 tablet 2  . potassium chloride (KLOR-CON) 10 MEQ tablet  Take one tablet daily for 1 week then take one tablet on on tuesdays and saturdays. 30 tablet 1  . primidone (MYSOLINE) 250 MG tablet Take 1 tablet (250 mg total) by mouth 4 (four) times daily. 360 tablet 3  . simvastatin (ZOCOR) 40 MG tablet TAKE 1 AND 1/2 TABLETS AT BEDTIME 135 tablet 1  . tamsulosin (FLOMAX) 0.4 MG CAPS capsule Take 1 capsule (0.4 mg total) by mouth daily. 90 capsule 3  . timolol (TIMOPTIC) 0.5 % ophthalmic solution Place 1 drop into both eyes 2 (two) times daily.   1  . Travoprost, BAK Free, (TRAVATAN Z) 0.004 % SOLN ophthalmic solution Place 1 drop into both eyes at bedtime. 5 mL 5  . triamcinolone (KENALOG) 0.025 % cream Apply 1 application topically 2 (two) times daily as needed (rash).     . [DISCONTINUED] insulin aspart (NOVOLOG) 100 UNIT/ML FlexPen Inject 4 Units into the skin 3 (three) times daily with meals. (Patient not taking: Reported on 01/19/2020) 6 mL 0  . [DISCONTINUED] Insulin Detemir (LEVEMIR) 100 UNIT/ML Pen Inject 5 Units into the skin 2 (two) times daily. (Patient not taking: Reported on 01/19/2020) 6 mL 0  . [DISCONTINUED] ipratropium-albuterol (DUONEB) 0.5-2.5 (3) MG/3ML SOLN Take 3 mLs by nebulization every 6 (six) hours as needed. (Patient not taking: Reported on 01/19/2020) 360 mL 0   No current facility-administered medications on file prior to visit.   Review of Systems All otherwise neg per pt    Objective:   Physical Exam BP 140/78 (BP Location: Left Arm, Patient Position: Sitting, Cuff Size: Large)   Pulse (!) 56   Temp 98.1 F (36.7 C) (Oral)   Ht 5\' 6"  (1.676 m)   SpO2 94%   BMI 33.89 kg/m  VS noted,  Constitutional: Pt appears in NAD HENT: Head: NCAT.  Right Ear: External ear normal.  Left Ear: External ear normal.  Eyes: . Pupils are equal, round, and reactive to light. Conjunctivae and EOM are normal Nose: without d/c or deformity Neck: Neck supple. Gross normal ROM Cardiovascular: Normal  rate and regular rhythm.   Pulmonary/Chest:  Effort normal and breath sounds without rales or wheezing.  Abd:  Soft, NT, ND, + BS, no organomegaly Neurological: Pt is alert. At baseline orientation, motor grossly intact Skin: Skin is warm. No rashes, other new lesions, no LE edema Psychiatric: Pt behavior is normal without agitation  All otherwise neg per pt  Lab Results  Component Value Date   WBC 7.1 01/18/2020   HGB 14.9 01/18/2020   HCT 45.3 01/18/2020   PLT 211 01/18/2020   GLUCOSE 115 (H) 01/23/2020   CHOL 164 06/24/2019   TRIG 201 (H) 08/16/2019   HDL 48.80 06/24/2019   LDLDIRECT 86.0 06/24/2019   LDLCALC 65 10/14/2017   ALT 41 10/31/2019   AST 31 10/31/2019   NA 141 01/23/2020   K 4.0 01/23/2020   CL 104 01/23/2020   CREATININE 1.13 (H) 01/23/2020   BUN 18 01/23/2020   CO2 28 01/23/2020   TSH 3.78 06/24/2019   PSA 0.53 11/12/2016   INR 1.15 02/23/2018   HGBA1C 6.0 (H) 08/18/2019       Assessment & Plan:

## 2020-04-26 ENCOUNTER — Encounter: Payer: Self-pay | Admitting: Internal Medicine

## 2020-04-26 LAB — LIPID PANEL
Cholesterol: 178 mg/dL (ref 0–200)
HDL: 34.1 mg/dL — ABNORMAL LOW (ref >39.00)
LDL Cholesterol: 107 mg/dL — ABNORMAL HIGH (ref 0–99)
NonHDL: 143.76
Total CHOL/HDL Ratio: 5
Triglycerides: 182 mg/dL — ABNORMAL HIGH (ref 0.0–149.0)
VLDL: 36.4 mg/dL (ref 0.0–40.0)

## 2020-04-26 LAB — CBC WITH DIFFERENTIAL/PLATELET
Basophils Absolute: 0.1 10*3/uL (ref 0.0–0.1)
Basophils Relative: 1.1 % (ref 0.0–3.0)
Eosinophils Absolute: 0.2 10*3/uL (ref 0.0–0.7)
Eosinophils Relative: 3 % (ref 0.0–5.0)
HCT: 40.2 % (ref 39.0–52.0)
Hemoglobin: 13.4 g/dL (ref 13.0–17.0)
Lymphocytes Relative: 21.6 % (ref 12.0–46.0)
Lymphs Abs: 1.5 10*3/uL (ref 0.7–4.0)
MCHC: 33.3 g/dL (ref 30.0–36.0)
MCV: 87.7 fl (ref 78.0–100.0)
Monocytes Absolute: 0.6 10*3/uL (ref 0.1–1.0)
Monocytes Relative: 8.6 % (ref 3.0–12.0)
Neutro Abs: 4.7 10*3/uL (ref 1.4–7.7)
Neutrophils Relative %: 65.7 % (ref 43.0–77.0)
Platelets: 218 10*3/uL (ref 150.0–400.0)
RBC: 4.58 Mil/uL (ref 4.22–5.81)
RDW: 14.1 % (ref 11.5–15.5)
WBC: 7.2 10*3/uL (ref 4.0–10.5)

## 2020-04-26 LAB — PTH, INTACT AND CALCIUM
Calcium: 9.2 mg/dL (ref 8.6–10.3)
PTH: 34 pg/mL (ref 14–64)

## 2020-04-26 LAB — HEMOGLOBIN A1C: Hgb A1c MFr Bld: 6.2 % (ref 4.6–6.5)

## 2020-04-26 LAB — HEPATIC FUNCTION PANEL
ALT: 19 U/L (ref 0–53)
AST: 18 U/L (ref 0–37)
Albumin: 4.4 g/dL (ref 3.5–5.2)
Alkaline Phosphatase: 82 U/L (ref 39–117)
Bilirubin, Direct: 0 mg/dL (ref 0.0–0.3)
Total Bilirubin: 0.3 mg/dL (ref 0.2–1.2)
Total Protein: 7.1 g/dL (ref 6.0–8.3)

## 2020-04-26 LAB — BASIC METABOLIC PANEL
BUN: 23 mg/dL (ref 6–23)
CO2: 29 mEq/L (ref 19–32)
Calcium: 9.3 mg/dL (ref 8.4–10.5)
Chloride: 102 mEq/L (ref 96–112)
Creatinine, Ser: 0.94 mg/dL (ref 0.40–1.50)
GFR: 74.79 mL/min (ref >60.00)
Glucose, Bld: 86 mg/dL (ref 70–99)
Potassium: 4.2 mEq/L (ref 3.5–5.1)
Sodium: 138 mEq/L (ref 135–145)

## 2020-04-26 LAB — PHOSPHORUS: Phosphorus: 3.4 mg/dL (ref 2.3–4.6)

## 2020-04-26 LAB — TSH: TSH: 3.18 u[IU]/mL (ref 0.35–4.50)

## 2020-04-26 LAB — VITAMIN D 25 HYDROXY (VIT D DEFICIENCY, FRACTURES): VITD: 33.1 ng/mL (ref 30.00–100.00)

## 2020-04-27 ENCOUNTER — Other Ambulatory Visit: Payer: Medicare Other

## 2020-04-27 ENCOUNTER — Ambulatory Visit (INDEPENDENT_AMBULATORY_CARE_PROVIDER_SITE_OTHER): Payer: No Typology Code available for payment source | Admitting: Pulmonary Disease

## 2020-04-27 ENCOUNTER — Other Ambulatory Visit: Payer: Self-pay

## 2020-04-27 ENCOUNTER — Encounter: Payer: Self-pay | Admitting: Pulmonary Disease

## 2020-04-27 VITALS — BP 126/70 | HR 54 | Temp 98.0°F | Ht 66.0 in | Wt 214.2 lb

## 2020-04-27 DIAGNOSIS — J449 Chronic obstructive pulmonary disease, unspecified: Secondary | ICD-10-CM

## 2020-04-27 DIAGNOSIS — Z8616 Personal history of COVID-19: Secondary | ICD-10-CM | POA: Diagnosis not present

## 2020-04-27 DIAGNOSIS — J8410 Pulmonary fibrosis, unspecified: Secondary | ICD-10-CM | POA: Diagnosis not present

## 2020-04-27 NOTE — Progress Notes (Signed)
Synopsis: Referred in Oct 2020 for SOB, history of COVID by Biagio Borg, MD  Subjective:   PATIENT ID: [name] GENDER: male DOB: [birth year], MRN: [id number]  Chief Complaint  Patient presents with  . Consult    non productive cough since Jan    84-year-old gentleman, past medical history to include bladder cancer, coronary artery disease, hypertension, hyperlipidemia. Patient followed at the [facility] hospital in [place]. He had a diagnosis of COVID-19 in January 2021. He also has a diagnosis of COPD currently managed with Trelegy inhaler. He ultimately went to the emergency department to seek evaluation in February for worsening shortness of breath. His COVID-19 was managed at home. But his subsequent ED visit in February 2021 resulted in a CT scan of the chest which was negative for PE but did reveal bilateral parenchymal changes concerning for fibrosis and scarring, postinfectious changes. Today patient presents to the office for evaluation of dyspnea on exertion. He states that he was originally referred for pulmonary rehabilitation by the [facility] hospital. He has establish care with a pulmonologist there. [facility] has [facility] and an outpatient doctor primary care. Currently managed COPD with Trelegy. He recently had a pulmonary function test last month at the [facility] hospital. His last PFTs completed in our system was 2017 with stage II moderate COPD. Of note he fell recently suffering a fracture in his spine and is wearing a brace. From a respiratory standpoint he does have dyspnea on exertion currently on triple therapy inhaler. [facility] recommended pulmonary rehabilitation which she has yet to establish with.    Past Medical History:  Diagnosis Date  . Acute bronchitis per pt no fever--  cough since discharge from hospital 07-02-2015   per pcp note 07-05-2015  mild to moderate bronchitis vs pna  . Allergic rhinitis   . Bladder cancer ([place])   . BPH (benign prostatic  hyperplasia)   . Bradycardia   . CAD (coronary artery disease)   . Chronic low back pain   . DDD (degenerative disc disease), lumbosacral   . Depression   . Diverticulosis of colon (without mention of hemorrhage)   . Dyspnea   . Essential tremor   . Fatty liver disease, nonalcoholic   . GERD (gastroesophageal reflux disease)   . Glaucoma   . History of bladder cancer    2004--  TCC low-grade , non-invasive  . History of colon polyps   . History of exercise stress test    02-05-2007-- ETT (Clinically positive, electrically equivocal, submaximal ETT,  appropriate bp response to exercise  . History of hiatal hernia   . History of kidney stones   . History of peptic ulcer   . HLD (hyperlipidemia)   . HTN (hypertension)   . Nocturia   . Productive cough   . Rheumatoid arthritis(714.0)   . Right renal artery stenosis (HCC)    per cath 03-14-2009 --- 40-50%  . RLS (restless legs syndrome)   . Vertigo, intermittent   . Weak urinary stream   . Wears dentures    upper  . Wears glasses   . Wears hearing aid    bilateral  . Wears partial dentures    lower     Family History  Problem Relation Age of Onset  . Heart attack Mother   . Cancer Father        lung  . Cancer Sister        lung  . Diabetes Sister   .  Cancer Brother        lung that spread to brain     Past Surgical History:  Procedure Laterality Date  . CARDIAC CATHETERIZATION  02-12-2007  dr gregg taylor   Non-obstructive CAD,  20% pLCX,  20% LAD, ef 65%  . CARDIAC CATHETERIZATION  03-14-2009  dr Johnsie Cancel   pLAD and mid diaginal 20-30% multiple lesions,  OM1 20%, right renal ostial 40-50%,  ef 60%  . CATARACT EXTRACTION W/ INTRAOCULAR LENS  IMPLANT, BILATERAL  2016  . CYSTOSCOPY WITH BIOPSY N/A 07/11/2015   Procedure: CYSTOSCOPY WITH COLD CUP RESECTION AND FULGERATION;  Surgeon: Rana Snare, MD;  Location: [facility];  Service: Urology;  Laterality: N/A;  . HIP ARTHROSCOPY W/ LABRAL DEBRIDEMENT  Left 06-29-2000   and chondraplasty  . LUMBAR [place] SURGERY  07-16-2005  . LUMBAR LAMINECTOMY/DECOMPRESSION MICRODISCECTOMY  07/09/2011   Procedure: LUMBAR LAMINECTOMY/DECOMPRESSION MICRODISCECTOMY;  Surgeon: Johnn Hai;  Location: [facility];  Service: Orthopedics;  Laterality: Left;  Decompression L5 - S1 on the Left and repair of dura  . LUMBAR LAMINECTOMY/DECOMPRESSION MICRODISCECTOMY N/A 07/27/2013   Procedure: DECOMPRESSION L4 - L5 WITH EXCISION OF SYNOVIAL CYST AND, LATERAL MASS FUSION 1 LEVEL;  Surgeon: Johnn Hai, MD;  Location: [facility];  Service: Orthopedics;  Laterality: N/A;  . LUMBAR LAMINECTOMY/DECOMPRESSION MICRODISCECTOMY Left 07/06/2014   Procedure:  MICRO LUMBAR DECOMPRESSION L5-S1 ON LEFT, L4-5 REDO;  Surgeon: Johnn Hai, MD;  Location: [facility];  Service: Orthopedics;  Laterality: Left;  . SPINAL CORD STIMULATOR INSERTION N/A 09/11/2016   Procedure: Spinal cord stimulator placement;  Surgeon: Melina Schools, MD;  Location: [employer];  Service: Orthopedics;  Laterality: N/A;  . TONSILLECTOMY AND ADENOIDECTOMY    . TRANSTHORACIC ECHOCARDIOGRAM  09-20-2010   normal LVF, ef 55-65%/  mild MR, TR and PR/  mild LAE  . TRANSURETHRAL RESECTION OF BLADDER TUMOR  09-21-2002    Social History   Socioeconomic History  . Marital status: Widowed    Spouse name: Not on file  . Number of children: 2  . Years of education: Not on file  . Highest education level: Not on file  Occupational History  . Occupation: retired from [employer]: RETIRED  Tobacco Use  . Smoking status: Former Smoker    Packs/day: 2.00    Years: 20.00    Pack years: 40.00    Types: Cigarettes    Quit date: 10/08/1982    Years since quitting: 37.5  . Smokeless tobacco: Never Used  Vaping Use  . Vaping Use: Never used  Substance and Sexual Activity  . Alcohol use: Yes    Comment: rare wine or beer occassionally  . Drug use: No  . Sexual activity: Never  Other Topics Concern  . Not on file    Social History Narrative  . Not on file   Social Determinants of Health   Financial Resource Strain:   . Difficulty of Paying Living Expenses: Not on file  Food Insecurity:   . Worried About Charity fundraiser in the Last Year: Not on file  . Ran Out of Food in the Last Year: Not on file  Transportation Needs:   . Lack of Transportation (Medical): Not on file  . Lack of Transportation (Non-Medical): Not on file  Physical Activity:   . Days of Exercise per Week: Not on file  . Minutes of Exercise per Session: Not on file  Stress:   . Feeling of Stress :  Not on file  Social Connections:   . Frequency of Communication with Friends and Family: Not on file  . Frequency of Social Gatherings with Friends and Family: Not on file  . Attends Religious Services: Not on file  . Active Member of Clubs or Organizations: Not on file  . Attends Archivist Meetings: Not on file  . Marital Status: Not on file  Intimate Partner Violence:   . Fear of Current or Ex-Partner: Not on file  . Emotionally Abused: Not on file  . Physically Abused: Not on file  . Sexually Abused: Not on file     Allergies  Allergen Reactions  . Morphine And Related Other (See Comments)    Migraine   . Sulfa Antibiotics Rash and Itching    Patient  Is ok to use paper tape  . Hydrochlorothiazide Other (See Comments)    REACTION: hyponatremia with daily use  . Tape Itching    Patient  Is ok to use paper tape  . Sulfur Dioxide   . Latex Rash  . Lisinopril Cough     Outpatient Medications Prior to Visit  Medication Sig Dispense Refill  . acetaminophen (TYLENOL) 500 MG tablet Take 1,000 mg by mouth every 6 (six) hours as needed for moderate pain.    Marland Kitchen albuterol (PROVENTIL) (2.5 MG/3ML) 0.083% nebulizer solution Take 3 mLs (2.5 mg total) by nebulization every 6 (six) hours as needed for wheezing or shortness of breath. 150 mL 1  . albuterol (VENTOLIN HFA) 108 (90 Base) MCG/ACT inhaler Inhale 2 puffs into  the lungs every 6 (six) hours as needed for wheezing or shortness of breath. 18 g 11  . amLODipine (NORVASC) 10 MG tablet TAKE 1 TABLET (10 MG TOTAL) DAILY. 90 tablet 1  . ascorbic acid (VITAMIN C) 500 MG tablet Take 1 tablet (500 mg total) by mouth daily. 30 tablet 0  . brimonidine (ALPHAGAN) 0.15 % ophthalmic solution Place 1 drop into both eyes 2 (two) times daily.    . budesonide-formoterol (SYMBICORT) 160-4.5 MCG/ACT inhaler Inhale 2 puffs into the lungs 2 (two) times daily. 1 Inhaler 3  . cetirizine (ZYRTEC) 10 MG tablet Take 10 mg by mouth daily.    . cholecalciferol (VITAMIN D3) 25 MCG (1000 UNIT) tablet Take 1,000 Units by mouth daily.    . citalopram (CELEXA) 20 MG tablet Take 1 tablet (20 mg total) by mouth daily. 90 tablet 3  . clonazePAM (KLONOPIN) 0.5 MG tablet     . clotrimazole (LOTRIMIN) 1 % cream Apply 1 application topically 2 (two) times daily as needed (FOR RASH). Prescribed for rash in groin    . dextromethorphan-guaiFENesin (MUCINEX DM) 30-600 MG 12hr tablet Take 1 tablet by mouth 2 (two) times daily.    . fluticasone (FLONASE) 50 MCG/ACT nasal spray USE 2 SPRAYS IN EACH NOSTRIL DAILY 48 g 1  . furosemide (LASIX) 20 MG tablet Take one tablet daily for one week then take one table only on Tuesdays and Saturdays. 30 tablet 1  . gabapentin (NEURONTIN) 300 MG capsule TAKE 1 TO 2 CAPSULES FOUR TIMES DAILY  AFTER MEALS AND AT BEDTIME. 720 capsule 3  . hydrALAZINE (APRESOLINE) 10 MG tablet TAKE 1 TABLET THREE TIMES DAILY AS NEEDED FOR BLOOD PRESSURE MORE THAN 150/90 270 tablet 0  . HYDROcodone-acetaminophen (NORCO/VICODIN) 5-325 MG tablet Take 1 tablet by mouth every 6 (six) hours as needed for severe pain. 10 tablet 0  . hydroxypropyl methylcellulose / hypromellose (ISOPTO TEARS / GONIOVISC) 2.5 %  ophthalmic solution Place 1 drop into both eyes 2 (two) times daily.    . Insulin Pen Needle 29G X 10MM MISC Use as directed 200 each 0  . LORazepam (ATIVAN) 1 MG tablet     . losartan  (COZAAR) 100 MG tablet TAKE 1 TABLET EVERY DAY 90 tablet 2  . meclizine (ANTIVERT) 25 MG tablet TAKE 1 TABLET TWICE DAILY AS NEEDED FOR DIZZINESS 180 tablet 1  . methocarbamol (ROBAXIN) 500 MG tablet Take 500 mg by mouth 3 (three) times daily as needed for muscle spasms.    . nitroGLYCERIN (NITROSTAT) 0.4 MG SL tablet PLACE 1 TABLET UNDER THE TONGUE EVERY 5 MINUTES AS NEEDED FOR CHEST PAIN. MAX 3 DOSES. CALL 911 IF NO IMPROVEMENT AFTER 1ST DOSE 25 tablet 0  . Nutritional Supplements (JUICE PLUS FIBRE PO) Take 1 each 2 (two) times daily by mouth.    Marland Kitchen omeprazole (PRILOSEC) 40 MG capsule TAKE 1 CAPSULE TWICE DAILY 180 capsule 3  . ondansetron (ZOFRAN) 4 MG tablet TAKE 1 TABLET EVERY 8 HOURS AS NEEDED FOR NAUSEA OR VOMITING 30 tablet 2  . potassium chloride (KLOR-CON) 10 MEQ tablet Take one tablet daily for 1 week then take one tablet on on tuesdays and saturdays. 30 tablet 1  . primidone (MYSOLINE) 250 MG tablet Take 1 tablet (250 mg total) by mouth 4 (four) times daily. 360 tablet 3  . simvastatin (ZOCOR) 40 MG tablet TAKE 1 AND 1/2 TABLETS AT BEDTIME 135 tablet 1  . tamsulosin (FLOMAX) 0.4 MG CAPS capsule Take 1 capsule (0.4 mg total) by mouth daily. 90 capsule 3  . timolol (TIMOPTIC) 0.5 % ophthalmic solution Place 1 drop into both eyes 2 (two) times daily.   1  . Travoprost, BAK Free, (TRAVATAN Z) 0.004 % SOLN ophthalmic solution Place 1 drop into both eyes at bedtime. 5 mL 5  . triamcinolone (KENALOG) 0.025 % cream Apply 1 application topically 2 (two) times daily as needed (rash).     . predniSONE (STERAPRED UNI-PAK 21 TAB) 5 MG (21) TBPK tablet      No facility-administered medications prior to visit.    Review of Systems  Constitutional: Negative for chills, fever, malaise/fatigue and weight loss.  HENT: Negative for hearing loss, sore throat and tinnitus.   Eyes: Negative for blurred vision and double vision.  Respiratory: Positive for shortness of breath. Negative for cough,  hemoptysis, sputum production, wheezing and stridor.   Cardiovascular: Negative for chest pain, palpitations, orthopnea, leg swelling and PND.  Gastrointestinal: Negative for abdominal pain, constipation, diarrhea, heartburn, nausea and vomiting.  Genitourinary: Negative for dysuria, hematuria and urgency.  Musculoskeletal: Negative for joint pain and myalgias.  Skin: Negative for itching and rash.  Neurological: Negative for dizziness, tingling, weakness and headaches.  Endo/Heme/Allergies: Negative for environmental allergies. Does not bruise/bleed easily.  Psychiatric/Behavioral: Negative for depression. The patient is not nervous/anxious and does not have insomnia.   All other systems reviewed and are negative.    Objective:  Physical Exam Vitals reviewed.  Constitutional:      General: He is not in acute distress.    Appearance: He is well-developed.  HENT:     Head: Normocephalic and atraumatic.  Eyes:     General: No scleral icterus.    Conjunctiva/sclera: Conjunctivae normal.     Pupils: Pupils are equal, round, and reactive to light.  Neck:     Vascular: No JVD.     Trachea: No tracheal deviation.  Cardiovascular:     Rate  and Rhythm: Normal rate and regular rhythm.     Heart sounds: Normal heart sounds. No murmur heard.   Pulmonary:     Effort: Pulmonary effort is normal. No tachypnea, accessory muscle usage or respiratory distress.     Breath sounds: Normal breath sounds. No stridor. No wheezing, rhonchi or rales.  Abdominal:     General: Bowel sounds are normal. There is no distension.     Palpations: Abdomen is soft.     Tenderness: There is no abdominal tenderness.  Musculoskeletal:        General: No tenderness.     Cervical back: Neck supple.     Comments: Thoracic spine/lumbar spine brace.  Lymphadenopathy:     Cervical: No cervical adenopathy.  Skin:    General: Skin is warm and dry.     Capillary Refill: Capillary refill takes less than 2 seconds.      Findings: No rash.  Neurological:     Mental Status: He is alert and oriented to person, place, and time.  Psychiatric:        Behavior: Behavior normal.      Vitals:   04/27/20 1517  BP: 126/70  Pulse: (!) 54  Temp: 98 F (36.7 C)  TempSrc: Other (Comment)  SpO2: 96%  Weight: 214 lb 3.2 oz (97.2 kg)  Height: 5\' 6"  (1.676 m)   96% on RA BMI Readings from Last 3 Encounters:  04/27/20 34.57 kg/m  04/25/20 33.89 kg/m  02/22/20 33.89 kg/m   Wt Readings from Last 3 Encounters:  04/27/20 214 lb 3.2 oz (97.2 kg)  02/22/20 210 lb (95.3 kg)  01/23/20 216 lb (98 kg)     CBC    Component Value Date/Time   WBC 7.2 04/25/2020 1619   RBC 4.58 04/25/2020 1619   HGB 13.4 04/25/2020 1619   HGB 13.5 09/19/2019 1924   HCT 40.2 04/25/2020 1619   HCT 41.0 09/19/2019 1924   PLT 218.0 04/25/2020 1619   PLT 307 09/19/2019 1924   MCV 87.7 04/25/2020 1619   MCV 95 09/19/2019 1924   MCH 29.7 01/18/2020 2322   MCHC 33.3 04/25/2020 1619   RDW 14.1 04/25/2020 1619   RDW 13.5 09/19/2019 1924   LYMPHSABS 1.5 04/25/2020 1619   LYMPHSABS 1.4 09/19/2019 1924   MONOABS 0.6 04/25/2020 1619   EOSABS 0.2 04/25/2020 1619   EOSABS 0.3 09/19/2019 1924   BASOSABS 0.1 04/25/2020 1619   BASOSABS 0.1 09/19/2019 1924     Chest Imaging: February 2021 CT chest: Bilateral infiltrates and parenchymal scarring. The patient's images have been independently reviewed by me.    Pulmonary Functions Testing Results: PFT Results Latest Ref Rng & Units 07/11/2016  FVC-Pre L 3.09  FVC-Predicted Pre % 86  FVC-Post L 3.10  FVC-Predicted Post % 86  Pre FEV1/FVC % % 62  Post FEV1/FCV % % 60  FEV1-Pre L 1.92  FEV1-Predicted Pre % 75  FEV1-Post L 1.88  DLCO uncorrected ml/min/mmHg 17.36  DLCO UNC% % 61  DLCO corrected ml/min/mmHg 17.72  DLCO COR %Predicted % 62  DLVA Predicted % 68  TLC L 6.44  TLC % Predicted % 99  RV % Predicted % 118    FeNO:   Pathology:  Echocardiogram:   Heart  Catheterization:     Assessment & Plan:     ICD-10-CM   1. Stage 2 moderate COPD by GOLD classification (HCC)  J44.9 AMB referral to pulmonary rehabilitation  2. 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  History of COVID-19  Z86.16     Discussion: This is an 84 year old gentleman, history of COVID-19, CT radiographic evidence of postinflammatory pulmonary fibrosis and scarring. He also has stage II moderate COPD on previous PFTs in 2017. He had had recent PFTs last month however unable to view these at this time. CT scan images reviewed today in the office.  Plan: Agree with continuation of triple therapy inhaler regimen Continue Trelegy inhaler. Albuterol as needed for shortness of breath and wheezing. Any use of nebulizer as needed shortness of breath and wheezing. Agree with referral to pulmonary rehabilitation. A new referral was placed for this hopefully can get established. Recommend the patient decide if he wanted to follow-up with [place] for his pulmonary care or follow-up at the [facility] because he is already established care with a pulmonologist there. As I would be happy to continue to follow him if decided.  No matter the case I would recommend a repeat noncontrast CT of the chest, possibly an HRCT of the chest for evaluation of his postinflammatory fibrosis and scarring in February 2022 which will be 1 year after his most recent CT scan.   Current Outpatient Medications:  .  acetaminophen (TYLENOL) 500 MG tablet, Take 1,000 mg by mouth every 6 (six) hours as needed for moderate pain., Disp: , Rfl:  .  albuterol (PROVENTIL) (2.5 MG/3ML) 0.083% nebulizer solution, Take 3 mLs (2.5 mg total) by nebulization every 6 (six) hours as needed for wheezing or shortness of breath., Disp: 150 mL, Rfl: 1 .  albuterol (VENTOLIN HFA) 108 (90 Base) MCG/ACT inhaler, Inhale 2 puffs into the lungs every 6 (six) hours as needed for wheezing or shortness of breath., Disp: 18 g, Rfl:  11 .  amLODipine (NORVASC) 10 MG tablet, TAKE 1 TABLET (10 MG TOTAL) DAILY., Disp: 90 tablet, Rfl: 1 .  ascorbic acid (VITAMIN C) 500 MG tablet, Take 1 tablet (500 mg total) by mouth daily., Disp: 30 tablet, Rfl: 0 .  brimonidine (ALPHAGAN) 0.15 % ophthalmic solution, Place 1 drop into both eyes 2 (two) times daily., Disp: , Rfl:  .  budesonide-formoterol (SYMBICORT) 160-4.5 MCG/ACT inhaler, Inhale 2 puffs into the lungs 2 (two) times daily., Disp: 1 Inhaler, Rfl: 3 .  cetirizine (ZYRTEC) 10 MG tablet, Take 10 mg by mouth daily., Disp: , Rfl:  .  cholecalciferol (VITAMIN D3) 25 MCG (1000 UNIT) tablet, Take 1,000 Units by mouth daily., Disp: , Rfl:  .  citalopram (CELEXA) 20 MG tablet, Take 1 tablet (20 mg total) by mouth daily., Disp: 90 tablet, Rfl: 3 .  clonazePAM (KLONOPIN) 0.5 MG tablet, , Disp: , Rfl:  .  clotrimazole (LOTRIMIN) 1 % cream, Apply 1 application topically 2 (two) times daily as needed (FOR RASH). Prescribed for rash in groin, Disp: , Rfl:  .  dextromethorphan-guaiFENesin (MUCINEX DM) 30-600 MG 12hr tablet, Take 1 tablet by mouth 2 (two) times daily., Disp: , Rfl:  .  fluticasone (FLONASE) 50 MCG/ACT nasal spray, USE 2 SPRAYS IN EACH NOSTRIL DAILY, Disp: 48 g, Rfl: 1 .  furosemide (LASIX) 20 MG tablet, Take one tablet daily for one week then take one table only on Tuesdays and Saturdays., Disp: 30 tablet, Rfl: 1 .  gabapentin (NEURONTIN) 300 MG capsule, TAKE 1 TO 2 CAPSULES FOUR TIMES DAILY  AFTER MEALS AND AT BEDTIME., Disp: 720 capsule, Rfl: 3 .  hydrALAZINE (APRESOLINE) 10 MG tablet, TAKE 1 TABLET THREE TIMES DAILY AS NEEDED FOR BLOOD PRESSURE MORE THAN 150/90, Disp: 270  tablet, Rfl: 0 .  HYDROcodone-acetaminophen (NORCO/VICODIN) 5-325 MG tablet, Take 1 tablet by mouth every 6 (six) hours as needed for severe pain., Disp: 10 tablet, Rfl: 0 .  hydroxypropyl methylcellulose / hypromellose (ISOPTO TEARS / GONIOVISC) 2.5 % ophthalmic solution, Place 1 drop into both eyes 2 (two)  times daily., Disp: , Rfl:  .  Insulin Pen Needle 29G X 10MM MISC, Use as directed, Disp: 200 each, Rfl: 0 .  LORazepam (ATIVAN) 1 MG tablet, , Disp: , Rfl:  .  losartan (COZAAR) 100 MG tablet, TAKE 1 TABLET EVERY DAY, Disp: 90 tablet, Rfl: 2 .  meclizine (ANTIVERT) 25 MG tablet, TAKE 1 TABLET TWICE DAILY AS NEEDED FOR DIZZINESS, Disp: 180 tablet, Rfl: 1 .  methocarbamol (ROBAXIN) 500 MG tablet, Take 500 mg by mouth 3 (three) times daily as needed for muscle spasms., Disp: , Rfl:  .  nitroGLYCERIN (NITROSTAT) 0.4 MG SL tablet, PLACE 1 TABLET UNDER THE TONGUE EVERY 5 MINUTES AS NEEDED FOR CHEST PAIN. MAX 3 DOSES. CALL 911 IF NO IMPROVEMENT AFTER 1ST DOSE, Disp: 25 tablet, Rfl: 0 .  Nutritional Supplements (JUICE PLUS FIBRE PO), Take 1 each 2 (two) times daily by mouth., Disp: , Rfl:  .  omeprazole (PRILOSEC) 40 MG capsule, TAKE 1 CAPSULE TWICE DAILY, Disp: 180 capsule, Rfl: 3 .  ondansetron (ZOFRAN) 4 MG tablet, TAKE 1 TABLET EVERY 8 HOURS AS NEEDED FOR NAUSEA OR VOMITING, Disp: 30 tablet, Rfl: 2 .  potassium chloride (KLOR-CON) 10 MEQ tablet, Take one tablet daily for 1 week then take one tablet on on tuesdays and saturdays., Disp: 30 tablet, Rfl: 1 .  primidone (MYSOLINE) 250 MG tablet, Take 1 tablet (250 mg total) by mouth 4 (four) times daily., Disp: 360 tablet, Rfl: 3 .  simvastatin (ZOCOR) 40 MG tablet, TAKE 1 AND 1/2 TABLETS AT BEDTIME, Disp: 135 tablet, Rfl: 1 .  tamsulosin (FLOMAX) 0.4 MG CAPS capsule, Take 1 capsule (0.4 mg total) by mouth daily., Disp: 90 capsule, Rfl: 3 .  timolol (TIMOPTIC) 0.5 % ophthalmic solution, Place 1 drop into both eyes 2 (two) times daily. , Disp: , Rfl: 1 .  Travoprost, BAK Free, (TRAVATAN Z) 0.004 % SOLN ophthalmic solution, Place 1 drop into both eyes at bedtime., Disp: 5 mL, Rfl: 5 .  triamcinolone (KENALOG) 0.025 % cream, Apply 1 application topically 2 (two) times daily as needed (rash). , Disp: , Rfl:   I spent 45 minutes dedicated to the care of this  patient on the date of this encounter to include pre-visit review of records, face-to-face time with the patient discussing conditions above, post visit ordering of testing, clinical documentation with the electronic health record, making appropriate referrals as documented, and communicating necessary findings to members of the patients care team.   Garner Nash, DO [facility] 04/27/2020 3:45 PM

## 2020-04-27 NOTE — Patient Instructions (Signed)
Thank you for visiting Dr. Valeta Harms at [facility]. Today we recommend the following:  Orders Placed This Encounter  Procedures  . AMB referral to pulmonary rehabilitation   Return if symptoms worsen or fail to improve.  [facility]   You are welcome to see [place] if needed   Would recommend a CT chest follow up in February 2022     Please do your part to reduce the spread of COVID-19.

## 2020-04-30 ENCOUNTER — Telehealth (HOSPITAL_COMMUNITY): Payer: Self-pay | Admitting: *Deleted

## 2020-04-30 NOTE — Telephone Encounter (Signed)
Received referral from Dr. Elpidio Anis for this pt to participate in pulmonary rehab.  Pt noted to live in [place].  Called and spoke to pt son, [place] who handles all of his fathers medical appt regarding preference of location.  Unaware that there is a program at [facility].  Son indicated that he would prefer to bring his father there verses coming to [place].  Asked pt son had he received any VA authorization for pulmonary rehab. Pt son was unsure.  Contact information for [facility] community care department given for follow up.  Called and left message for [facility] Cardiac and Pulmonary rehab staff to inform them of this referral. Requested a call back. Cherre Huger, BSN Cardiac and Training and development officer

## 2020-05-01 ENCOUNTER — Encounter: Payer: Self-pay | Admitting: Internal Medicine

## 2020-05-01 NOTE — Assessment & Plan Note (Signed)
stable overall by history and exam, recent data reviewed with pt, and pt to continue medical treatment as before,  to f/u any worsening symptoms or concerns  

## 2020-05-01 NOTE — Assessment & Plan Note (Addendum)
stable overall by history and exam, recent data reviewed with pt, and pt to continue medical treatment as before,  to f/u any worsening symptoms or concerns  I spent 31 minutes in preparing to see the patient by review of recent labs, imaging and procedures, obtaining and reviewing separately obtained history, communicating with the patient and family or caregiver, ordering medications, tests or procedures, and documenting clinical information in the EHR including the differential Dx, treatment, and any further evaluation and other management of ckd, htn, hld, hyperglycemia, aortic atherosclerosis

## 2020-05-01 NOTE — Assessment & Plan Note (Signed)
Cont statin and low chol diet

## 2020-05-02 ENCOUNTER — Telehealth: Payer: Self-pay | Admitting: Pulmonary Disease

## 2020-05-02 NOTE — Telephone Encounter (Signed)
Records have been faxed as the [place] referred the pt to [place]. Nothing further was needed.

## 2020-05-07 ENCOUNTER — Telehealth: Payer: Self-pay | Admitting: Emergency Medicine

## 2020-05-07 DIAGNOSIS — S32009D Unspecified fracture of unspecified lumbar vertebra, subsequent encounter for fracture with routine healing: Secondary | ICD-10-CM | POA: Diagnosis not present

## 2020-05-07 DIAGNOSIS — M545 Low back pain, unspecified: Secondary | ICD-10-CM | POA: Diagnosis not present

## 2020-05-07 NOTE — Telephone Encounter (Signed)
Pt came by and stated Susa Day, MD with [facility] ordered Labs for him. He is wondering if Dr Jenny Reichmann could order the same labs so that he can have them done at [facility] lab instead of going somewhere else? Please advise and give patient a call back. Labs that were ordered are:  ICD-10 M54.50 Low back pain - 6399 CBC (includes diff/plt - 809 Sed rate by modified westergren - 4420 C-reactive protein - 750 Protein, total and protein electrophoresis, urine - 10231 Comprehensive metabolic panel - 586 Protein, total and protein electrophoresis - 905 Uric acid - 249 ANA screen, ifa, w/refl titer and pattern - 14523 Urine protein, total, random (w/o Creatinine) - 213 immunofixation, urine

## 2020-05-09 DIAGNOSIS — M2560 Stiffness of unspecified joint, not elsewhere classified: Secondary | ICD-10-CM | POA: Diagnosis not present

## 2020-05-09 DIAGNOSIS — M545 Low back pain, unspecified: Secondary | ICD-10-CM | POA: Diagnosis not present

## 2020-05-09 DIAGNOSIS — R262 Difficulty in walking, not elsewhere classified: Secondary | ICD-10-CM | POA: Diagnosis not present

## 2020-05-09 DIAGNOSIS — R293 Abnormal posture: Secondary | ICD-10-CM | POA: Diagnosis not present

## 2020-05-14 DIAGNOSIS — R293 Abnormal posture: Secondary | ICD-10-CM | POA: Diagnosis not present

## 2020-05-14 DIAGNOSIS — M545 Low back pain, unspecified: Secondary | ICD-10-CM | POA: Diagnosis not present

## 2020-05-14 DIAGNOSIS — R262 Difficulty in walking, not elsewhere classified: Secondary | ICD-10-CM | POA: Diagnosis not present

## 2020-05-14 DIAGNOSIS — M2560 Stiffness of unspecified joint, not elsewhere classified: Secondary | ICD-10-CM | POA: Diagnosis not present

## 2020-05-15 ENCOUNTER — Other Ambulatory Visit: Payer: Self-pay | Admitting: Internal Medicine

## 2020-05-15 DIAGNOSIS — M2560 Stiffness of unspecified joint, not elsewhere classified: Secondary | ICD-10-CM | POA: Diagnosis not present

## 2020-05-15 DIAGNOSIS — M545 Low back pain, unspecified: Secondary | ICD-10-CM | POA: Diagnosis not present

## 2020-05-15 DIAGNOSIS — R262 Difficulty in walking, not elsewhere classified: Secondary | ICD-10-CM | POA: Diagnosis not present

## 2020-05-15 DIAGNOSIS — R293 Abnormal posture: Secondary | ICD-10-CM | POA: Diagnosis not present

## 2020-05-15 NOTE — Telephone Encounter (Signed)
Sent to Dr. John. 

## 2020-05-15 NOTE — Telephone Encounter (Signed)
Very sorry, but these are unusual labs, and I am legally responsible for the outcomes of the results, which I am not comfortable with since I am not the original ordering MD and these are specific for what dr beane wants

## 2020-05-16 DIAGNOSIS — R262 Difficulty in walking, not elsewhere classified: Secondary | ICD-10-CM | POA: Diagnosis not present

## 2020-05-16 DIAGNOSIS — M2560 Stiffness of unspecified joint, not elsewhere classified: Secondary | ICD-10-CM | POA: Diagnosis not present

## 2020-05-16 DIAGNOSIS — R293 Abnormal posture: Secondary | ICD-10-CM | POA: Diagnosis not present

## 2020-05-16 DIAGNOSIS — M545 Low back pain, unspecified: Secondary | ICD-10-CM | POA: Diagnosis not present

## 2020-05-16 IMAGING — DX DG CHEST 2V
2 series · 2 of 2 positions shown · non-contrast
Comparison: Chest radiograph dated 10/24/2019.

CLINICAL DATA: 84-year-old male with bilateral lower extremity
edema.

EXAM:
CHEST - 2 VIEW

[chest pa]
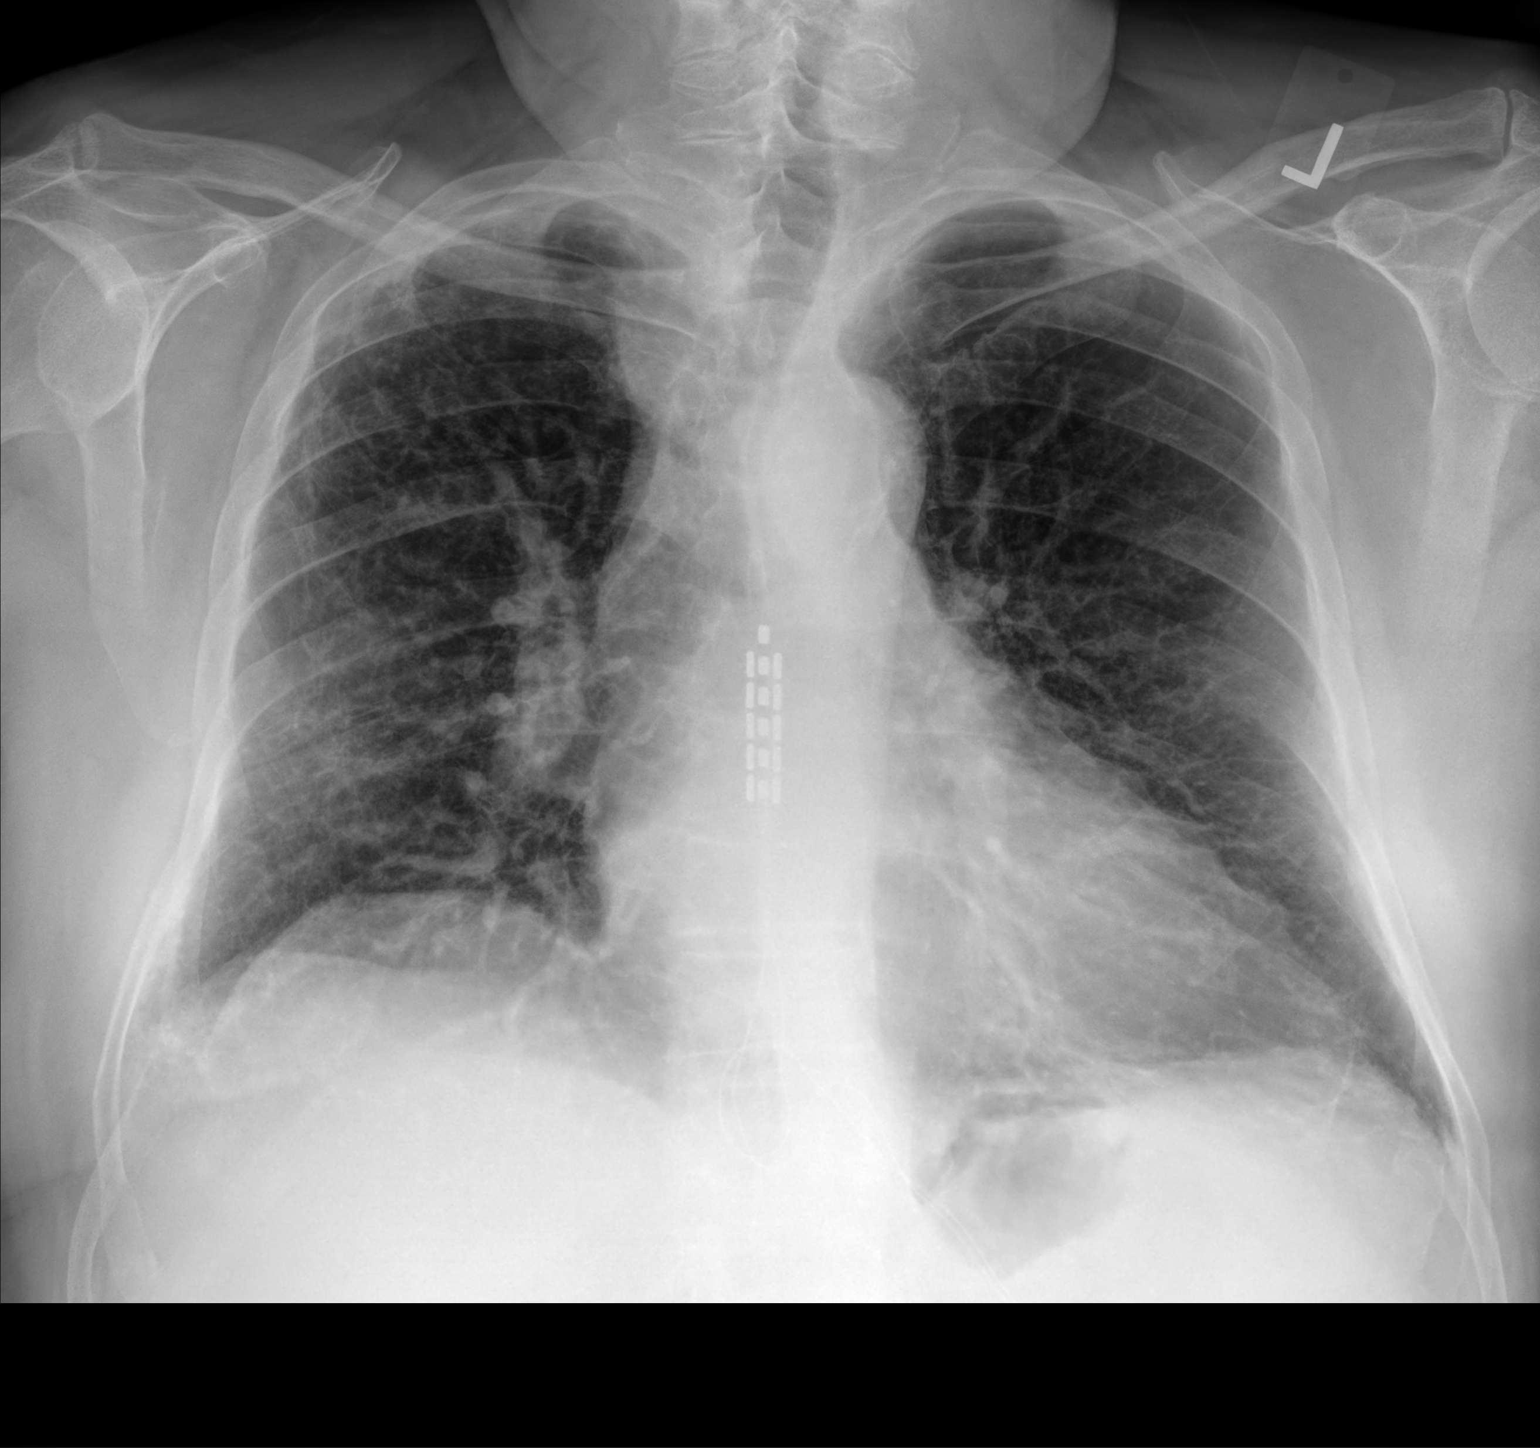

[chest lat]
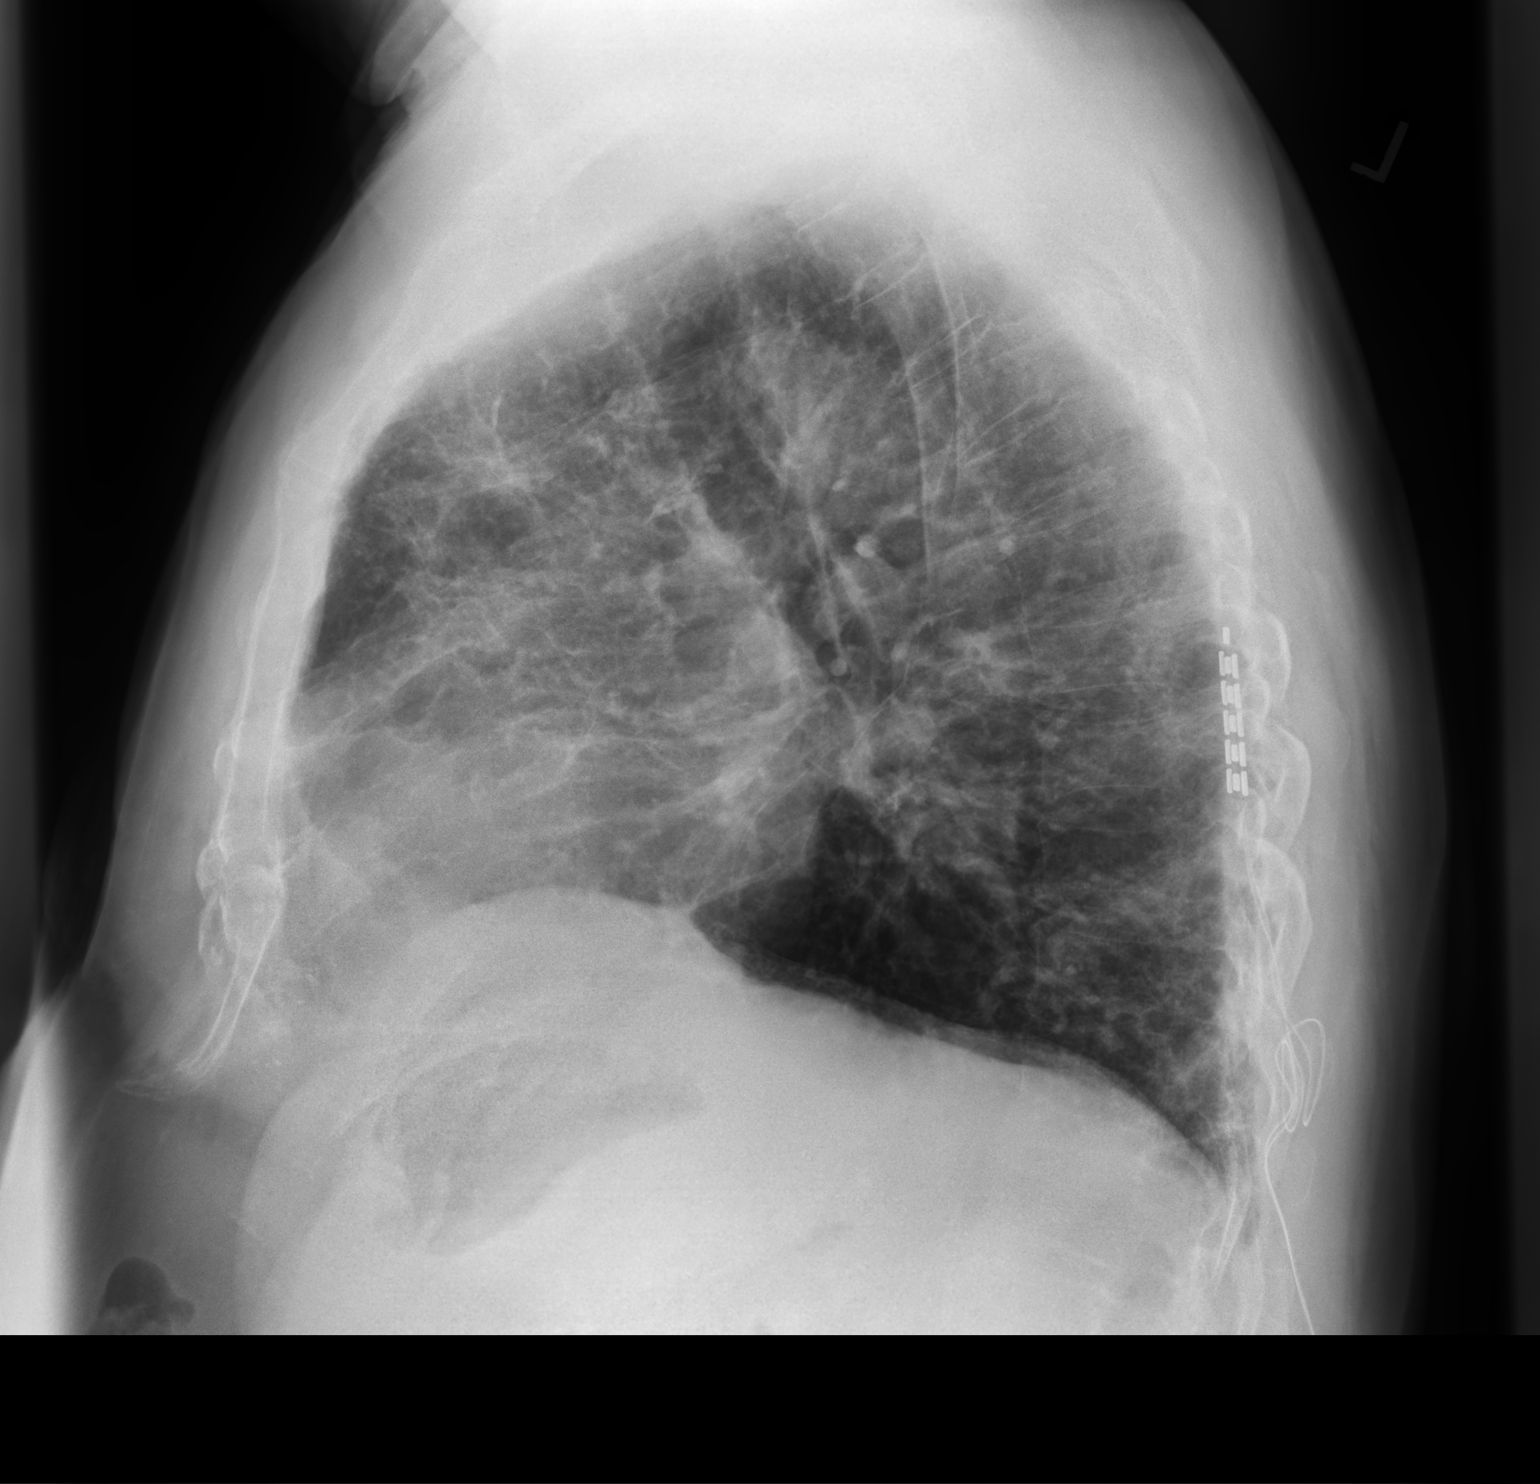

[2 of 2 positions shown; findings below may reference images not displayed]

FINDINGS: Background of emphysema and chronic bronchitic changes. Areas of
subpleural scarring primarily involving the lung bases and right
upper lobe, possibly post inflammatory. No consolidative changes.
There is no pleural effusion or pneumothorax. Stable cardiac
silhouette. Atherosclerotic calcification of the aorta. No acute
osseous pathology. Thoracic spine stimulator.
IMPRESSION: No active cardiopulmonary disease.

## 2020-05-16 NOTE — Telephone Encounter (Signed)
Please refill as per office routine med refill policy (all routine meds refilled for 3 mo or monthly per pt preference up to one year from last visit, then month to month grace period for 3 mo, then further med refills will have to be denied)  

## 2020-05-21 DIAGNOSIS — R293 Abnormal posture: Secondary | ICD-10-CM | POA: Diagnosis not present

## 2020-05-21 DIAGNOSIS — R262 Difficulty in walking, not elsewhere classified: Secondary | ICD-10-CM | POA: Diagnosis not present

## 2020-05-21 DIAGNOSIS — M2560 Stiffness of unspecified joint, not elsewhere classified: Secondary | ICD-10-CM | POA: Diagnosis not present

## 2020-05-21 DIAGNOSIS — M545 Low back pain, unspecified: Secondary | ICD-10-CM | POA: Diagnosis not present

## 2020-05-22 DIAGNOSIS — M5416 Radiculopathy, lumbar region: Secondary | ICD-10-CM | POA: Diagnosis not present

## 2020-05-23 DIAGNOSIS — R262 Difficulty in walking, not elsewhere classified: Secondary | ICD-10-CM | POA: Diagnosis not present

## 2020-05-23 DIAGNOSIS — M2560 Stiffness of unspecified joint, not elsewhere classified: Secondary | ICD-10-CM | POA: Diagnosis not present

## 2020-05-23 DIAGNOSIS — R293 Abnormal posture: Secondary | ICD-10-CM | POA: Diagnosis not present

## 2020-05-23 DIAGNOSIS — M545 Low back pain, unspecified: Secondary | ICD-10-CM | POA: Diagnosis not present

## 2020-05-24 DIAGNOSIS — D229 Melanocytic nevi, unspecified: Secondary | ICD-10-CM | POA: Diagnosis not present

## 2020-05-24 DIAGNOSIS — Z85828 Personal history of other malignant neoplasm of skin: Secondary | ICD-10-CM | POA: Diagnosis not present

## 2020-05-24 DIAGNOSIS — D1801 Hemangioma of skin and subcutaneous tissue: Secondary | ICD-10-CM | POA: Diagnosis not present

## 2020-05-24 DIAGNOSIS — L905 Scar conditions and fibrosis of skin: Secondary | ICD-10-CM | POA: Diagnosis not present

## 2020-05-24 DIAGNOSIS — L814 Other melanin hyperpigmentation: Secondary | ICD-10-CM | POA: Diagnosis not present

## 2020-05-24 DIAGNOSIS — L821 Other seborrheic keratosis: Secondary | ICD-10-CM | POA: Diagnosis not present

## 2020-05-24 DIAGNOSIS — L819 Disorder of pigmentation, unspecified: Secondary | ICD-10-CM | POA: Diagnosis not present

## 2020-05-27 ENCOUNTER — Other Ambulatory Visit: Payer: Self-pay | Admitting: Internal Medicine

## 2020-05-27 NOTE — Telephone Encounter (Signed)
Please refill as per office routine med refill policy (all routine meds refilled for 3 mo or monthly per pt preference up to one year from last visit, then month to month grace period for 3 mo, then further med refills will have to be denied)  

## 2020-05-28 DIAGNOSIS — R262 Difficulty in walking, not elsewhere classified: Secondary | ICD-10-CM | POA: Diagnosis not present

## 2020-05-28 DIAGNOSIS — R293 Abnormal posture: Secondary | ICD-10-CM | POA: Diagnosis not present

## 2020-05-28 DIAGNOSIS — M2560 Stiffness of unspecified joint, not elsewhere classified: Secondary | ICD-10-CM | POA: Diagnosis not present

## 2020-05-28 DIAGNOSIS — M545 Low back pain, unspecified: Secondary | ICD-10-CM | POA: Diagnosis not present

## 2020-05-30 DIAGNOSIS — M545 Low back pain, unspecified: Secondary | ICD-10-CM | POA: Diagnosis not present

## 2020-05-30 DIAGNOSIS — R293 Abnormal posture: Secondary | ICD-10-CM | POA: Diagnosis not present

## 2020-05-30 DIAGNOSIS — R262 Difficulty in walking, not elsewhere classified: Secondary | ICD-10-CM | POA: Diagnosis not present

## 2020-05-30 DIAGNOSIS — M2560 Stiffness of unspecified joint, not elsewhere classified: Secondary | ICD-10-CM | POA: Diagnosis not present

## 2020-06-04 DIAGNOSIS — M2560 Stiffness of unspecified joint, not elsewhere classified: Secondary | ICD-10-CM | POA: Diagnosis not present

## 2020-06-04 DIAGNOSIS — R293 Abnormal posture: Secondary | ICD-10-CM | POA: Diagnosis not present

## 2020-06-04 DIAGNOSIS — R262 Difficulty in walking, not elsewhere classified: Secondary | ICD-10-CM | POA: Diagnosis not present

## 2020-06-04 DIAGNOSIS — M545 Low back pain, unspecified: Secondary | ICD-10-CM | POA: Diagnosis not present

## 2020-06-05 DIAGNOSIS — R262 Difficulty in walking, not elsewhere classified: Secondary | ICD-10-CM | POA: Diagnosis not present

## 2020-06-05 DIAGNOSIS — M545 Low back pain, unspecified: Secondary | ICD-10-CM | POA: Diagnosis not present

## 2020-06-05 DIAGNOSIS — M2560 Stiffness of unspecified joint, not elsewhere classified: Secondary | ICD-10-CM | POA: Diagnosis not present

## 2020-06-05 DIAGNOSIS — R293 Abnormal posture: Secondary | ICD-10-CM | POA: Diagnosis not present

## 2020-06-10 ENCOUNTER — Other Ambulatory Visit: Payer: Self-pay | Admitting: Internal Medicine

## 2020-06-11 DIAGNOSIS — M2560 Stiffness of unspecified joint, not elsewhere classified: Secondary | ICD-10-CM | POA: Diagnosis not present

## 2020-06-11 DIAGNOSIS — M545 Low back pain, unspecified: Secondary | ICD-10-CM | POA: Diagnosis not present

## 2020-06-11 DIAGNOSIS — R262 Difficulty in walking, not elsewhere classified: Secondary | ICD-10-CM | POA: Diagnosis not present

## 2020-06-11 DIAGNOSIS — R293 Abnormal posture: Secondary | ICD-10-CM | POA: Diagnosis not present

## 2020-06-12 DIAGNOSIS — Z23 Encounter for immunization: Secondary | ICD-10-CM | POA: Diagnosis not present

## 2020-06-18 DIAGNOSIS — M2560 Stiffness of unspecified joint, not elsewhere classified: Secondary | ICD-10-CM | POA: Diagnosis not present

## 2020-06-18 DIAGNOSIS — M545 Low back pain, unspecified: Secondary | ICD-10-CM | POA: Diagnosis not present

## 2020-06-18 DIAGNOSIS — R262 Difficulty in walking, not elsewhere classified: Secondary | ICD-10-CM | POA: Diagnosis not present

## 2020-06-18 DIAGNOSIS — R293 Abnormal posture: Secondary | ICD-10-CM | POA: Diagnosis not present

## 2020-06-20 DIAGNOSIS — M961 Postlaminectomy syndrome, not elsewhere classified: Secondary | ICD-10-CM | POA: Diagnosis not present

## 2020-06-20 DIAGNOSIS — I700 Atherosclerosis of aorta: Secondary | ICD-10-CM | POA: Diagnosis not present

## 2020-06-20 DIAGNOSIS — M5416 Radiculopathy, lumbar region: Secondary | ICD-10-CM | POA: Diagnosis not present

## 2020-06-24 ENCOUNTER — Other Ambulatory Visit: Payer: Self-pay | Admitting: Cardiology

## 2020-07-02 ENCOUNTER — Telehealth: Payer: Self-pay | Admitting: Pulmonary Disease

## 2020-07-02 DIAGNOSIS — J449 Chronic obstructive pulmonary disease, unspecified: Secondary | ICD-10-CM

## 2020-07-02 DIAGNOSIS — J8410 Pulmonary fibrosis, unspecified: Secondary | ICD-10-CM

## 2020-07-02 NOTE — Telephone Encounter (Signed)
Patient was originaly set up for our Rehab but when they called pt to set it up he stated he wanted to do it in [place]

## 2020-07-04 NOTE — Telephone Encounter (Signed)
PCCs, please advise if a new order is needed in order for pt to continue his pulm rehab. Thanks.

## 2020-07-05 NOTE — Telephone Encounter (Signed)
Yes put in for for pul rehab in [place] thanks Joellen Jersey

## 2020-07-09 NOTE — Telephone Encounter (Signed)
New order placed. Will close encounter.

## 2020-07-24 ENCOUNTER — Ambulatory Visit (INDEPENDENT_AMBULATORY_CARE_PROVIDER_SITE_OTHER): Payer: Medicare Other | Admitting: Podiatry

## 2020-07-24 ENCOUNTER — Encounter: Payer: Self-pay | Admitting: Podiatry

## 2020-07-24 ENCOUNTER — Other Ambulatory Visit: Payer: Self-pay | Admitting: Internal Medicine

## 2020-07-24 ENCOUNTER — Other Ambulatory Visit: Payer: Self-pay

## 2020-07-24 DIAGNOSIS — M79676 Pain in unspecified toe(s): Secondary | ICD-10-CM | POA: Diagnosis not present

## 2020-07-24 DIAGNOSIS — B351 Tinea unguium: Secondary | ICD-10-CM

## 2020-07-28 NOTE — Progress Notes (Signed)
Subjective:  Patient ID: [name], male    DOB: [birth year],  MRN: [id number]  85 y.o. male presents with painful thick toenails that are difficult to trim. Pain interferes with ambulation. Aggravating factors include wearing enclosed shoe gear. Pain is relieved with periodic professional debridement.Marland Kitchen    He voices no new pedal problems on today's visit.  Review of Systems: Negative except as noted in the HPI. Denies N/V/F/Ch.  Past Medical History:  Diagnosis Date  . Acute bronchitis per pt no fever--  cough since discharge from hospital 07-02-2015   per pcp note 07-05-2015  mild to moderate bronchitis vs pna  . Allergic rhinitis   . Bladder cancer ([place])   . BPH (benign prostatic hyperplasia)   . Bradycardia   . CAD (coronary artery disease)   . Chronic low back pain   . DDD (degenerative disc disease), lumbosacral   . Depression   . Diverticulosis of colon (without mention of hemorrhage)   . Dyspnea   . Essential tremor   . Fatty liver disease, nonalcoholic   . GERD (gastroesophageal reflux disease)   . Glaucoma   . History of bladder cancer    2004--  TCC low-grade , non-invasive  . History of colon polyps   . History of exercise stress test    02-05-2007-- ETT (Clinically positive, electrically equivocal, submaximal ETT,  appropriate bp response to exercise  . History of hiatal hernia   . History of kidney stones   . History of peptic ulcer   . HLD (hyperlipidemia)   . HTN (hypertension)   . Nocturia   . Productive cough   . Rheumatoid arthritis(714.0)   . Right renal artery stenosis (HCC)    per cath 03-14-2009 --- 40-50%  . RLS (restless legs syndrome)   . Vertigo, intermittent   . Weak urinary stream   . Wears dentures    upper  . Wears glasses   . Wears hearing aid    bilateral  . Wears partial dentures    lower   Past Surgical History:  Procedure Laterality Date  . CARDIAC CATHETERIZATION  02-12-2007  dr gregg taylor   Non-obstructive CAD,  20%  pLCX,  20% LAD, ef 65%  . CARDIAC CATHETERIZATION  03-14-2009  dr Johnsie Cancel   pLAD and mid diaginal 20-30% multiple lesions,  OM1 20%, right renal ostial 40-50%,  ef 60%  . CATARACT EXTRACTION W/ INTRAOCULAR LENS  IMPLANT, BILATERAL  2016  . CYSTOSCOPY WITH BIOPSY N/A 07/11/2015   Procedure: CYSTOSCOPY WITH COLD CUP RESECTION AND FULGERATION;  Surgeon: Rana Snare, MD;  Location: [facility];  Service: Urology;  Laterality: N/A;  . HIP ARTHROSCOPY W/ LABRAL DEBRIDEMENT Left 06-29-2000   and chondraplasty  . LUMBAR [place] SURGERY  07-16-2005  . LUMBAR LAMINECTOMY/DECOMPRESSION MICRODISCECTOMY  07/09/2011   Procedure: LUMBAR LAMINECTOMY/DECOMPRESSION MICRODISCECTOMY;  Surgeon: Johnn Hai;  Location: [facility];  Service: Orthopedics;  Laterality: Left;  Decompression L5 - S1 on the Left and repair of dura  . LUMBAR LAMINECTOMY/DECOMPRESSION MICRODISCECTOMY N/A 07/27/2013   Procedure: DECOMPRESSION L4 - L5 WITH EXCISION OF SYNOVIAL CYST AND, LATERAL MASS FUSION 1 LEVEL;  Surgeon: Johnn Hai, MD;  Location: [facility];  Service: Orthopedics;  Laterality: N/A;  . LUMBAR LAMINECTOMY/DECOMPRESSION MICRODISCECTOMY Left 07/06/2014   Procedure:  MICRO LUMBAR DECOMPRESSION L5-S1 ON LEFT, L4-5 REDO;  Surgeon: Johnn Hai, MD;  Location: [facility];  Service: Orthopedics;  Laterality: Left;  . SPINAL CORD STIMULATOR INSERTION N/A 09/11/2016  Procedure: Spinal cord stimulator placement;  Surgeon: Venita Lick, MD;  Location: [facility] OR;  Service: Orthopedics;  Laterality: N/A;  . TONSILLECTOMY AND ADENOIDECTOMY    . TRANSTHORACIC ECHOCARDIOGRAM  09-20-2010   normal LVF, ef 55-65%/  mild MR, TR and PR/  mild LAE  . TRANSURETHRAL RESECTION OF BLADDER TUMOR  09-21-2002   Patient Active Problem List   Diagnosis Date Noted  . Aortic atherosclerosis (HCC) 04/25/2020  . AKI (acute kidney injury) (HCC) 01/23/2020  . Chest pain of uncertain etiology 11/12/2019  . Essential hypertension 11/12/2019  .  Dizziness 11/12/2019  . Obesity (BMI 30-39.9) 11/12/2019  . History of COVID-19 10/03/2019  . Acute respiratory failure with hypoxia (HCC) 10/03/2019  . Pneumonia due to COVID-19 virus 08/16/2019  . Acute respiratory failure due to COVID-19 (HCC) 08/16/2019  . COVID-19 virus infection 08/09/2019  . Memory loss 06/24/2019  . Wheezing 06/24/2019  . Abrasion of left arm 04/11/2019  . Joint swelling of lower leg 11/29/2018  . Abnormal urinalysis 02/25/2018  . Anxiety 01/08/2018  . Laceration of hand 12/18/2017  . Pain of left hand 12/18/2017  . Laceration of scalp without foreign body 06/09/2017  . Nonintractable headache 06/09/2017  . Acute sinus infection 04/14/2017  . Nausea and vomiting 03/27/2017  . Hyponatremia 03/17/2017  . Right lumbar radiculopathy 01/21/2017  . Fall 01/21/2017  . Synovial cyst 01/21/2017  . Chronic pain 09/11/2016  . Generalized weakness 05/22/2016  . UTI (urinary tract infection) 05/22/2016  . Weakness 05/22/2016  . Fever blister 10/18/2015  . Dyspnea 10/17/2015  . Bradycardia 09/17/2015  . Chest pain at rest 09/17/2015  . CAD in native artery 09/17/2015  . CHF (congestive heart failure) (HCC) 09/17/2015  . Pulmonary hypertension (HCC) 09/17/2015  . Bladder cancer (HCC) 09/17/2015  . Essential tremor 09/17/2015  . Chest pain 09/17/2015  . Anginal chest pain at rest [facility]) 09/17/2015  . Hypokalemia 07/13/2015  . Vertigo 07/01/2015  . Dizzinesses 07/01/2015  . Spinal stenosis of lumbar region at multiple levels 07/06/2014  . Angular cheilitis 05/05/2014  . Left lumbar radiculopathy 05/04/2014  . Right elbow pain 12/29/2013  . Lateral epicondylitis of right elbow 12/29/2013  . Cough 09/14/2013  . Spinal stenosis, lumbar 07/27/2013  . Palpable mass of neck 07/21/2013  . Vasovagal episode 01/10/2013  . Left otitis media 10/28/2012  . Impaired glucose tolerance 10/28/2012  . Cellulitis 04/24/2012  . Hemorrhoids 04/24/2012  . Syncope 04/21/2012   . Preventative health care 11/28/2010  . Depression 09/12/2010  . RENAL ARTERY STENOSIS 09/12/2010  . FATIGUE 09/12/2010  . BUNION, RIGHT FOOT 02/05/2010  . RECTAL BLEEDING 07/10/2009  . Diarrhea 07/10/2009  . DIVERTICULOSIS OF COLON 03/28/2009  . FATTY LIVER DISEASE 03/28/2009  . BRADYCARDIA 03/20/2009  . DIZZINESS 10/17/2008  . CHEST PAIN 10/17/2008  . Malignant neoplasm of bladder (HCC) 02/07/2008  . Allergic rhinitis 02/07/2008  . COLONIC POLYPS, HX OF 02/07/2008  . Hyperlipidemia 11/24/2007  . GERD 11/24/2007  . HIATAL HERNIA WITH REFLUX 11/24/2007  . CKD (chronic kidney disease) stage 3, GFR 30-59 ml/min (HCC) 11/24/2007  . Rheumatoid arthritis (HCC) 11/24/2007  . DISC DISEASE, LUMBAR 11/24/2007  . ADENOIDECTOMY, HX OF 11/24/2007    Current Outpatient Medications:  .  acetaminophen (TYLENOL) 500 MG tablet, Take 1,000 mg by mouth every 6 (six) hours as needed for moderate pain., Disp: , Rfl:  .  albuterol (PROVENTIL) (2.5 MG/3ML) 0.083% nebulizer solution, Take 3 mLs (2.5 mg total) by nebulization every 6 (six) hours as needed for  wheezing or shortness of breath., Disp: 150 mL, Rfl: 1 .  albuterol (VENTOLIN HFA) 108 (90 Base) MCG/ACT inhaler, Inhale 2 puffs into the lungs every 6 (six) hours as needed for wheezing or shortness of breath., Disp: 18 g, Rfl: 11 .  amLODipine (NORVASC) 10 MG tablet, TAKE 1 TABLET (10 MG TOTAL) DAILY., Disp: 90 tablet, Rfl: 1 .  ascorbic acid (VITAMIN C) 500 MG tablet, Take 1 tablet (500 mg total) by mouth daily., Disp: 30 tablet, Rfl: 0 .  brimonidine (ALPHAGAN) 0.15 % ophthalmic solution, Place 1 drop into both eyes 2 (two) times daily., Disp: , Rfl:  .  budesonide-formoterol (SYMBICORT) 160-4.5 MCG/ACT inhaler, Inhale 2 puffs into the lungs 2 (two) times daily., Disp: 1 Inhaler, Rfl: 3 .  cetirizine (ZYRTEC) 10 MG tablet, Take 10 mg by mouth daily., Disp: , Rfl:  .  cholecalciferol (VITAMIN D3) 25 MCG (1000 UNIT) tablet, Take 1,000 Units by  mouth daily., Disp: , Rfl:  .  citalopram (CELEXA) 20 MG tablet, Take 1 tablet (20 mg total) by mouth daily., Disp: 90 tablet, Rfl: 3 .  clonazePAM (KLONOPIN) 0.5 MG tablet, TAKE 1 TABLET(0.5 MG) BY MOUTH TWICE DAILY AS NEEDED FOR ANXIETY, Disp: 60 tablet, Rfl: 2 .  clotrimazole (LOTRIMIN) 1 % cream, Apply 1 application topically 2 (two) times daily as needed (FOR RASH). Prescribed for rash in groin, Disp: , Rfl:  .  dextromethorphan-guaiFENesin (MUCINEX DM) 30-600 MG 12hr tablet, Take 1 tablet by mouth 2 (two) times daily., Disp: , Rfl:  .  fluticasone (FLONASE) 50 MCG/ACT nasal spray, USE 2 SPRAYS IN EACH NOSTRIL DAILY, Disp: 48 g, Rfl: 1 .  furosemide (LASIX) 20 MG tablet, TAKE 1 TABLET BY MOUTH DAILY FOR ONE WEEK THEN TAKE 1 TABLET ONLY ON TUESDAYS AND SATURDAYS, Disp: 30 tablet, Rfl: 1 .  gabapentin (NEURONTIN) 300 MG capsule, TAKE 1 TO 2 CAPSULES FOUR TIMES DAILY  AFTER MEALS AND AT BEDTIME., Disp: 720 capsule, Rfl: 3 .  hydrALAZINE (APRESOLINE) 10 MG tablet, TAKE 1 TABLET THREE TIMES DAILY AS NEEDED FOR BLOOD PRESSURE MORE THAN 150/90, Disp: 270 tablet, Rfl: 0 .  HYDROcodone-acetaminophen (NORCO/VICODIN) 5-325 MG tablet, Take 1 tablet by mouth every 6 (six) hours as needed for severe pain., Disp: 10 tablet, Rfl: 0 .  hydroxypropyl methylcellulose / hypromellose (ISOPTO TEARS / GONIOVISC) 2.5 % ophthalmic solution, Place 1 drop into both eyes 2 (two) times daily., Disp: , Rfl:  .  Insulin Pen Needle 29G X 10MM MISC, Use as directed, Disp: 200 each, Rfl: 0 .  LORazepam (ATIVAN) 1 MG tablet, , Disp: , Rfl:  .  losartan (COZAAR) 100 MG tablet, TAKE 1 TABLET EVERY DAY, Disp: 90 tablet, Rfl: 2 .  meclizine (ANTIVERT) 25 MG tablet, TAKE 1 TABLET TWICE DAILY AS NEEDED FOR DIZZINESS, Disp: 180 tablet, Rfl: 1 .  methocarbamol (ROBAXIN) 500 MG tablet, Take 500 mg by mouth 3 (three) times daily as needed for muscle spasms., Disp: , Rfl:  .  nitroGLYCERIN (NITROSTAT) 0.4 MG SL tablet, PLACE 1 TABLET UNDER  THE TONGUE EVERY 5 MINUTES AS NEEDED FOR CHEST PAIN. MAX 3 DOSES. CALL 911 IF NO IMPROVEMENT AFTER 1ST DOSE, Disp: 25 tablet, Rfl: 0 .  Nutritional Supplements (JUICE PLUS FIBRE PO), Take 1 each 2 (two) times daily by mouth., Disp: , Rfl:  .  omeprazole (PRILOSEC) 40 MG capsule, TAKE 1 CAPSULE TWICE DAILY, Disp: 180 capsule, Rfl: 3 .  ondansetron (ZOFRAN) 4 MG tablet, TAKE 1 TABLET EVERY 8 HOURS  AS NEEDED FOR NAUSEA OR VOMITING, Disp: 30 tablet, Rfl: 2 .  potassium chloride (KLOR-CON) 10 MEQ tablet, Take one tablet daily for 1 week then take one tablet on on tuesdays and saturdays., Disp: 30 tablet, Rfl: 1 .  primidone (MYSOLINE) 250 MG tablet, Take 1 tablet (250 mg total) by mouth 4 (four) times daily., Disp: 360 tablet, Rfl: 3 .  simvastatin (ZOCOR) 40 MG tablet, TAKE 1 AND 1/2 TABLETS AT BEDTIME, Disp: 135 tablet, Rfl: 1 .  tamsulosin (FLOMAX) 0.4 MG CAPS capsule, Take 1 capsule (0.4 mg total) by mouth daily., Disp: 90 capsule, Rfl: 3 .  timolol (TIMOPTIC) 0.5 % ophthalmic solution, Place 1 drop into both eyes 2 (two) times daily. , Disp: , Rfl: 1 .  Travoprost, BAK Free, (TRAVATAN Z) 0.004 % SOLN ophthalmic solution, Place 1 drop into both eyes at bedtime., Disp: 5 mL, Rfl: 5 .  triamcinolone (KENALOG) 0.025 % cream, Apply 1 application topically 2 (two) times daily as needed (rash). , Disp: , Rfl:  Allergies  Allergen Reactions  . Morphine And Related Other (See Comments)    Migraine   . Sulfa Antibiotics Rash and Itching    Patient  Is ok to use paper tape  . Hydrochlorothiazide Other (See Comments)    REACTION: hyponatremia with daily use  . Tape Itching    Patient  Is ok to use paper tape  . Sulfur Dioxide   . Latex Rash  . Lisinopril Cough   Social History   Tobacco Use  Smoking Status Former Smoker  . Packs/day: 2.00  . Years: 20.00  . Pack years: 40.00  . Types: Cigarettes  . Quit date: 10/08/1982  . Years since quitting: 37.8  Smokeless Tobacco Never Used   Objective:    Constitutional Well developed. Well nourished.  Vascular Dorsalis pedis pulses palpable bilaterally. Posterior tibial pulses palpable bilaterally. Capillary refill normal to all digits.  No cyanosis or clubbing noted. Pedal hair growth sparse b/l.  Neurologic Normal speech. Oriented to person, place, and time. Epicritic sensation to light touch grossly present bilaterally.  Dermatologic Nails elongated dystrophic, and discolored x 10  pain to palpation No open wounds. No skin lesions. No interdigital macerations.  Orthopedic: Normal joint ROM without pain or crepitus bilaterally. Patient has on back brace. Utilizes rollator for gait assistance. No visible deformities. No bony tenderness.   Radiographs: None Assessment:   1. Pain due to onychomycosis of toenail    Plan:  Patient was evaluated and treated and all questions answered. Onychomycosis with pain -Nails palliatively debridement as below. -Educated on self-care  Procedure: Nail Debridement Rationale: Pain Type of Debridement: manual, sharp debridement. Instrumentation: Nail nipper, rotary burr. Number of Nails: 10  -Toenails 1-5 b/l were debrided in length and girth with sterile nail nippers and dremel without iatrogenic bleeding.  -Patient to report any pedal injuries to medical professional immediately. -Patient to continue soft, supportive shoe gear daily. -Patient/POA to call should there be question/concern in the interim.  Return in about 3 months (around 10/22/2020).  Marzetta Board, DPM

## 2020-07-31 ENCOUNTER — Other Ambulatory Visit: Payer: Self-pay | Admitting: Internal Medicine

## 2020-07-31 NOTE — Telephone Encounter (Signed)
Please refill as per office routine med refill policy (all routine meds refilled for 3 mo or monthly per pt preference up to one year from last visit, then month to month grace period for 3 mo, then further med refills will have to be denied)  

## 2020-08-09 DIAGNOSIS — M5416 Radiculopathy, lumbar region: Secondary | ICD-10-CM | POA: Diagnosis not present

## 2020-08-09 DIAGNOSIS — M961 Postlaminectomy syndrome, not elsewhere classified: Secondary | ICD-10-CM | POA: Diagnosis not present

## 2020-08-13 DIAGNOSIS — Z85828 Personal history of other malignant neoplasm of skin: Secondary | ICD-10-CM | POA: Diagnosis not present

## 2020-08-13 DIAGNOSIS — D225 Melanocytic nevi of trunk: Secondary | ICD-10-CM | POA: Diagnosis not present

## 2020-08-13 DIAGNOSIS — L905 Scar conditions and fibrosis of skin: Secondary | ICD-10-CM | POA: Diagnosis not present

## 2020-08-13 DIAGNOSIS — D485 Neoplasm of uncertain behavior of skin: Secondary | ICD-10-CM | POA: Diagnosis not present

## 2020-08-13 DIAGNOSIS — C44629 Squamous cell carcinoma of skin of left upper limb, including shoulder: Secondary | ICD-10-CM | POA: Diagnosis not present

## 2020-08-13 DIAGNOSIS — L570 Actinic keratosis: Secondary | ICD-10-CM | POA: Diagnosis not present

## 2020-08-13 DIAGNOSIS — L821 Other seborrheic keratosis: Secondary | ICD-10-CM | POA: Diagnosis not present

## 2020-08-28 DIAGNOSIS — Z86010 Personal history of colonic polyps: Secondary | ICD-10-CM | POA: Insufficient documentation

## 2020-08-28 DIAGNOSIS — I2510 Atherosclerotic heart disease of native coronary artery without angina pectoris: Secondary | ICD-10-CM | POA: Insufficient documentation

## 2020-08-28 DIAGNOSIS — J209 Acute bronchitis, unspecified: Secondary | ICD-10-CM | POA: Insufficient documentation

## 2020-08-28 DIAGNOSIS — M545 Low back pain, unspecified: Secondary | ICD-10-CM | POA: Insufficient documentation

## 2020-08-28 DIAGNOSIS — I10 Essential (primary) hypertension: Secondary | ICD-10-CM | POA: Insufficient documentation

## 2020-08-28 DIAGNOSIS — R351 Nocturia: Secondary | ICD-10-CM | POA: Insufficient documentation

## 2020-08-28 DIAGNOSIS — K219 Gastro-esophageal reflux disease without esophagitis: Secondary | ICD-10-CM | POA: Insufficient documentation

## 2020-08-28 DIAGNOSIS — Z9289 Personal history of other medical treatment: Secondary | ICD-10-CM | POA: Insufficient documentation

## 2020-08-28 DIAGNOSIS — E785 Hyperlipidemia, unspecified: Secondary | ICD-10-CM | POA: Insufficient documentation

## 2020-08-28 DIAGNOSIS — R058 Other specified cough: Secondary | ICD-10-CM | POA: Insufficient documentation

## 2020-08-28 DIAGNOSIS — Z974 Presence of external hearing-aid: Secondary | ICD-10-CM | POA: Insufficient documentation

## 2020-08-28 DIAGNOSIS — Z8551 Personal history of malignant neoplasm of bladder: Secondary | ICD-10-CM | POA: Insufficient documentation

## 2020-08-28 DIAGNOSIS — R3912 Poor urinary stream: Secondary | ICD-10-CM | POA: Insufficient documentation

## 2020-08-28 DIAGNOSIS — K760 Fatty (change of) liver, not elsewhere classified: Secondary | ICD-10-CM | POA: Insufficient documentation

## 2020-08-28 DIAGNOSIS — H409 Unspecified glaucoma: Secondary | ICD-10-CM | POA: Insufficient documentation

## 2020-08-28 DIAGNOSIS — Z8719 Personal history of other diseases of the digestive system: Secondary | ICD-10-CM | POA: Insufficient documentation

## 2020-08-28 DIAGNOSIS — M5137 Other intervertebral disc degeneration, lumbosacral region: Secondary | ICD-10-CM | POA: Insufficient documentation

## 2020-08-28 DIAGNOSIS — Z972 Presence of dental prosthetic device (complete) (partial): Secondary | ICD-10-CM | POA: Insufficient documentation

## 2020-08-28 DIAGNOSIS — R42 Dizziness and giddiness: Secondary | ICD-10-CM | POA: Insufficient documentation

## 2020-08-28 DIAGNOSIS — Z87442 Personal history of urinary calculi: Secondary | ICD-10-CM | POA: Insufficient documentation

## 2020-08-28 DIAGNOSIS — Z8711 Personal history of peptic ulcer disease: Secondary | ICD-10-CM | POA: Insufficient documentation

## 2020-08-28 DIAGNOSIS — Z973 Presence of spectacles and contact lenses: Secondary | ICD-10-CM | POA: Insufficient documentation

## 2020-08-28 DIAGNOSIS — N400 Enlarged prostate without lower urinary tract symptoms: Secondary | ICD-10-CM | POA: Insufficient documentation

## 2020-08-28 DIAGNOSIS — G2581 Restless legs syndrome: Secondary | ICD-10-CM | POA: Insufficient documentation

## 2020-08-29 ENCOUNTER — Encounter: Payer: Self-pay | Admitting: Cardiology

## 2020-08-29 ENCOUNTER — Ambulatory Visit (INDEPENDENT_AMBULATORY_CARE_PROVIDER_SITE_OTHER): Payer: Medicare Other | Admitting: Cardiology

## 2020-08-29 ENCOUNTER — Other Ambulatory Visit: Payer: Self-pay

## 2020-08-29 VITALS — BP 120/80 | HR 55 | Ht 66.0 in | Wt 214.0 lb

## 2020-08-29 DIAGNOSIS — I2510 Atherosclerotic heart disease of native coronary artery without angina pectoris: Secondary | ICD-10-CM

## 2020-08-29 DIAGNOSIS — R059 Cough, unspecified: Secondary | ICD-10-CM

## 2020-08-29 DIAGNOSIS — E669 Obesity, unspecified: Secondary | ICD-10-CM | POA: Diagnosis not present

## 2020-08-29 DIAGNOSIS — I272 Pulmonary hypertension, unspecified: Secondary | ICD-10-CM

## 2020-08-29 DIAGNOSIS — I701 Atherosclerosis of renal artery: Secondary | ICD-10-CM | POA: Diagnosis not present

## 2020-08-29 MED ORDER — BENZONATATE 100 MG PO CAPS: 100.0000 mg | ORAL_CAPSULE | Freq: Three times a day (TID) | ORAL | 0 refills | Status: DC | PRN

## 2020-08-29 NOTE — Patient Instructions (Signed)

## 2020-08-29 NOTE — Progress Notes (Signed)
Cardiology Office Note:    Date:  08/29/2020   ID:  BERN FARE, DOB 05/31/1936, MRN [id number]  PCP:  Biagio Borg, MD  Cardiologist:  Berniece Salines, DO  Electrophysiologist:  None   Referring MD: Biagio Borg, MD   Chief Complaint  Patient presents with  . Follow-up    History of Present Illness:    [name] is a 85 y.o. male with a hx of nonobstructive CAD per records cardiac catheterization in 2010 hypertension, hyperlipidemia, history of COVID-19 infection and COPD presents today for follow-up visit.  I last saw the patient on November 11, 2019 at that time giving his symptoms I recommended he undergo nuclear stress test as well as an echocardiogram.  In the interim the patient able to get his testing done, and I was able to discuss the results with him prior.  I saw the patient in August 2021 at that time he appeared to be doing well from a cardiovascular standpoint.  He was pending a result of his pulmonary function test.  No medication changes were made at that time.  Interim he has started pulmonary rehab and he really likes it.  He is here today with his son.  He tells me he has been experiencing intermittent coughing he usually takes [employer] which he is ran out of and is asking for me to help him with the prescription.   Past Medical History:  Diagnosis Date  . Acute bronchitis per pt no fever--  cough since discharge from hospital 07-02-2015   per pcp note 07-05-2015  mild to moderate bronchitis vs pna  . Allergic rhinitis   . Bladder cancer ([place])   . BPH (benign prostatic hyperplasia)   . Bradycardia   . CAD (coronary artery disease)   . Chronic low back pain   . DDD (degenerative disc disease), lumbosacral   . Depression   . Diverticulosis of colon (without mention of hemorrhage)   . Dyspnea   . Essential tremor   . Fatty liver disease, nonalcoholic   . GERD (gastroesophageal reflux disease)   . Glaucoma   . History of bladder cancer    2004--   TCC low-grade , non-invasive  . History of colon polyps   . History of exercise stress test    02-05-2007-- ETT (Clinically positive, electrically equivocal, submaximal ETT,  appropriate bp response to exercise  . History of hiatal hernia   . History of kidney stones   . History of peptic ulcer   . HLD (hyperlipidemia)   . HTN (hypertension)   . Nocturia   . Productive cough   . Rheumatoid arthritis(714.0)   . Right renal artery stenosis (HCC)    per cath 03-14-2009 --- 40-50%  . RLS (restless legs syndrome)   . Vertigo, intermittent   . Weak urinary stream   . Wears dentures    upper  . Wears glasses   . Wears hearing aid    bilateral  . Wears partial dentures    lower    Past Surgical History:  Procedure Laterality Date  . CARDIAC CATHETERIZATION  02-12-2007  dr gregg taylor   Non-obstructive CAD,  20% pLCX,  20% LAD, ef 65%  . CARDIAC CATHETERIZATION  03-14-2009  dr Johnsie Cancel   pLAD and mid diaginal 20-30% multiple lesions,  OM1 20%, right renal ostial 40-50%,  ef 60%  . CATARACT EXTRACTION W/ INTRAOCULAR LENS  IMPLANT, BILATERAL  2016  . CYSTOSCOPY WITH BIOPSY N/A 07/11/2015  Procedure: CYSTOSCOPY WITH COLD CUP RESECTION AND FULGERATION;  Surgeon: Rana Snare, MD;  Location: [facility];  Service: Urology;  Laterality: N/A;  . HIP ARTHROSCOPY W/ LABRAL DEBRIDEMENT Left 06-29-2000   and chondraplasty  . LUMBAR [place] SURGERY  07-16-2005  . LUMBAR LAMINECTOMY/DECOMPRESSION MICRODISCECTOMY  07/09/2011   Procedure: LUMBAR LAMINECTOMY/DECOMPRESSION MICRODISCECTOMY;  Surgeon: Johnn Hai;  Location: [facility];  Service: Orthopedics;  Laterality: Left;  Decompression L5 - S1 on the Left and repair of dura  . LUMBAR LAMINECTOMY/DECOMPRESSION MICRODISCECTOMY N/A 07/27/2013   Procedure: DECOMPRESSION L4 - L5 WITH EXCISION OF SYNOVIAL CYST AND, LATERAL MASS FUSION 1 LEVEL;  Surgeon: Johnn Hai, MD;  Location: [facility];  Service: Orthopedics;  Laterality: N/A;  .  LUMBAR LAMINECTOMY/DECOMPRESSION MICRODISCECTOMY Left 07/06/2014   Procedure:  MICRO LUMBAR DECOMPRESSION L5-S1 ON LEFT, L4-5 REDO;  Surgeon: Johnn Hai, MD;  Location: [facility];  Service: Orthopedics;  Laterality: Left;  . SPINAL CORD STIMULATOR INSERTION N/A 09/11/2016   Procedure: Spinal cord stimulator placement;  Surgeon: Melina Schools, MD;  Location: [place];  Service: Orthopedics;  Laterality: N/A;  . TONSILLECTOMY AND ADENOIDECTOMY    . TRANSTHORACIC ECHOCARDIOGRAM  09-20-2010   normal LVF, ef 55-65%/  mild MR, TR and PR/  mild LAE  . TRANSURETHRAL RESECTION OF BLADDER TUMOR  09-21-2002    Current Medications: Current Meds  Medication Sig  . acetaminophen (TYLENOL) 500 MG tablet Take 1,000 mg by mouth every 6 (six) hours as needed for moderate pain.  Marland Kitchen albuterol (PROVENTIL) (2.5 MG/3ML) 0.083% nebulizer solution Take 3 mLs (2.5 mg total) by nebulization every 6 (six) hours as needed for wheezing or shortness of breath.  Marland Kitchen albuterol (VENTOLIN HFA) 108 (90 Base) MCG/ACT inhaler Inhale 2 puffs into the lungs every 6 (six) hours as needed for wheezing or shortness of breath.  Marland Kitchen amLODipine (NORVASC) 10 MG tablet TAKE 1 TABLET (10 MG TOTAL) DAILY.  Marland Kitchen ascorbic acid (VITAMIN C) 500 MG tablet Take 1 tablet (500 mg total) by mouth daily.  . benzonatate (TESSALON PERLES) 100 MG capsule Take 1 capsule (100 mg total) by mouth 3 (three) times daily as needed for cough.  . brimonidine (ALPHAGAN) 0.15 % ophthalmic solution Place 1 drop into both eyes 2 (two) times daily.  . budesonide-formoterol (SYMBICORT) 160-4.5 MCG/ACT inhaler Inhale 2 puffs into the lungs 2 (two) times daily.  . cetirizine (ZYRTEC) 10 MG tablet Take 10 mg by mouth daily.  . cholecalciferol (VITAMIN D3) 25 MCG (1000 UNIT) tablet Take 1,000 Units by mouth daily.  . citalopram (CELEXA) 20 MG tablet Take 1 tablet (20 mg total) by mouth daily.  . clonazePAM (KLONOPIN) 0.5 MG tablet TAKE 1 TABLET(0.5 MG) BY MOUTH TWICE DAILY AS NEEDED  FOR ANXIETY  . clotrimazole (LOTRIMIN) 1 % cream Apply 1 application topically 2 (two) times daily as needed (FOR RASH). Prescribed for rash in groin  . dextromethorphan-guaiFENesin (MUCINEX DM) 30-600 MG 12hr tablet Take 1 tablet by mouth 2 (two) times daily.  . fluticasone (FLONASE) 50 MCG/ACT nasal spray USE 2 SPRAYS IN EACH NOSTRIL DAILY  . furosemide (LASIX) 20 MG tablet TAKE 1 TABLET BY MOUTH DAILY FOR ONE WEEK THEN TAKE 1 TABLET ONLY ON TUESDAYS AND SATURDAYS  . gabapentin (NEURONTIN) 300 MG capsule TAKE 1 TO 2 CAPSULES FOUR TIMES DAILY  AFTER MEALS AND AT BEDTIME.  . hydrALAZINE (APRESOLINE) 10 MG tablet TAKE 1 TABLET THREE TIMES DAILY AS NEEDED FOR BLOOD PRESSURE MORE THAN 150/90  . HYDROcodone-acetaminophen (  NORCO/VICODIN) 5-325 MG tablet Take 1 tablet by mouth every 6 (six) hours as needed for severe pain.  . hydroxypropyl methylcellulose / hypromellose (ISOPTO TEARS / GONIOVISC) 2.5 % ophthalmic solution Place 1 drop into both eyes 2 (two) times daily.  . Insulin Pen Needle 29G X 10MM MISC Use as directed  . LORazepam (ATIVAN) 1 MG tablet   . losartan (COZAAR) 100 MG tablet TAKE 1 TABLET EVERY DAY  . meclizine (ANTIVERT) 25 MG tablet TAKE 1 TABLET TWICE DAILY AS NEEDED FOR DIZZINESS  . methocarbamol (ROBAXIN) 500 MG tablet Take 500 mg by mouth 3 (three) times daily as needed for muscle spasms.  . nitroGLYCERIN (NITROSTAT) 0.4 MG SL tablet PLACE 1 TABLET UNDER THE TONGUE EVERY 5 MINUTES AS NEEDED FOR CHEST PAIN. MAX 3 DOSES. CALL 911 IF NO IMPROVEMENT AFTER 1ST DOSE  . Nutritional Supplements (JUICE PLUS FIBRE PO) Take 1 each 2 (two) times daily by mouth.  Marland Kitchen omeprazole (PRILOSEC) 40 MG capsule TAKE 1 CAPSULE TWICE DAILY  . ondansetron (ZOFRAN) 4 MG tablet TAKE 1 TABLET EVERY 8 HOURS AS NEEDED FOR NAUSEA OR VOMITING  . potassium chloride (KLOR-CON) 10 MEQ tablet Take one tablet daily for 1 week then take one tablet on on tuesdays and saturdays.  . primidone (MYSOLINE) 250 MG tablet Take  1 tablet (250 mg total) by mouth 4 (four) times daily.  . simvastatin (ZOCOR) 40 MG tablet TAKE 1 AND 1/2 TABLETS AT BEDTIME  . tamsulosin (FLOMAX) 0.4 MG CAPS capsule Take 1 capsule (0.4 mg total) by mouth daily.  . timolol (TIMOPTIC) 0.5 % ophthalmic solution Place 1 drop into both eyes 2 (two) times daily.   . Travoprost, BAK Free, (TRAVATAN Z) 0.004 % SOLN ophthalmic solution Place 1 drop into both eyes at bedtime.  . triamcinolone (KENALOG) 0.025 % cream Apply 1 application topically 2 (two) times daily as needed (rash).      Allergies:   Morphine and related, Sulfa antibiotics, Hydrochlorothiazide, Tape, Hytrin [terazosin], Sulfur dioxide, Latex, and Lisinopril   Social History   Socioeconomic History  . Marital status: Widowed    Spouse name: Not on file  . Number of children: 2  . Years of education: Not on file  . Highest education level: Not on file  Occupational History  . Occupation: retired from [employer]: RETIRED  Tobacco Use  . Smoking status: Former Smoker    Packs/day: 2.00    Years: 20.00    Pack years: 40.00    Types: Cigarettes    Quit date: 10/08/1982    Years since quitting: 37.9  . Smokeless tobacco: Never Used  Vaping Use  . Vaping Use: Never used  Substance and Sexual Activity  . Alcohol use: Yes    Comment: rare wine or beer occassionally  . Drug use: No  . Sexual activity: Never  Other Topics Concern  . Not on file  Social History Narrative  . Not on file   Social Determinants of Health   Financial Resource Strain: Not on file  Food Insecurity: Not on file  Transportation Needs: Not on file  Physical Activity: Not on file  Stress: Not on file  Social Connections: Not on file     Family History: The patient's family history includes Cancer in his brother, father, and sister; Diabetes in his sister; Heart attack in his mother.  ROS:   Review of Systems  Constitution: Negative for decreased appetite, fever and weight gain.   HENT: Negative  for congestion, ear discharge, hoarse voice and sore throat.   Eyes: Negative for discharge, redness, vision loss in right eye and visual halos.  Cardiovascular: Negative for chest pain, dyspnea on exertion, leg swelling, orthopnea and palpitations.  Respiratory: Reports cough.  Negative for hemoptysis, shortness of breath and snoring.   Endocrine: Negative for heat intolerance and polyphagia.  Hematologic/Lymphatic: Negative for bleeding problem. Does not bruise/bleed easily.  Skin: Negative for flushing, nail changes, rash and suspicious lesions.  Musculoskeletal: Negative for arthritis, joint pain, muscle cramps, myalgias, neck pain and stiffness.  Gastrointestinal: Negative for abdominal pain, bowel incontinence, diarrhea and excessive appetite.  Genitourinary: Negative for decreased libido, genital sores and incomplete emptying.  Neurological: Negative for brief paralysis, focal weakness, headaches and loss of balance.  Psychiatric/Behavioral: Negative for altered mental status, depression and suicidal ideas.  Allergic/Immunologic: Negative for HIV exposure and persistent infections.    EKGs/Labs/Other Studies Reviewed:    The following studies were reviewed today:   EKG: None today  Echo  IMPRESSIONS 11/17/19  1. Left ventricular ejection fraction, by estimation, is 60 to 65%. The left ventricle has normal function. The left ventricle has no regional wall motion abnormalities. Left ventricular diastolic parameters are consistent with Grade I diastolic dysfunction (impaired relaxation).  2. Right ventricular systolic function is normal. The right ventricular size is normal. There is normal pulmonary artery systolic pressure.  3. Left atrial size was mild to moderately dilated.  4. The mitral valve is normal in structure. No evidence of mitral valve regurgitation. No evidence of mitral stenosis.  5. The aortic valve is normal in structure. Aortic valve regurgitation  is mild. Mild aortic valve sclerosis is present, with no evidence of aortic valve stenosis.  6. There is mild dilatation of the ascending aorta measuring 40 mm.  7. The inferior vena cava is normal in size with greater than 50% respiratory variability, suggesting right atrial pressure of 3 mmHg.   Pharmacologic nuclear stress test  Nuclear stress EF: 61%.  There was no ST segment deviation noted during stress.  The study is normal.  This is a low risk study.  The left ventricular ejection fraction is normal (55-65%).  Normal stress nuclear study with no ischemia or infarction. Gated ejection fraction 61% with normal wall motion.   Recent Labs: 10/24/2019: B Natriuretic Peptide 92.6 10/31/2019: Pro B Natriuretic peptide (BNP) 118.0 12/16/2019: Magnesium 2.0 04/25/2020: ALT 19; BUN 23; Creatinine, Ser 0.94; Hemoglobin 13.4; Platelets 218.0; Potassium 4.2; Sodium 138; TSH 3.18  Recent Lipid Panel    Component Value Date/Time   CHOL 178 04/25/2020 1619   TRIG 182.0 (H) 04/25/2020 1619   HDL 34.10 (L) 04/25/2020 1619   CHOLHDL 5 04/25/2020 1619   VLDL 36.4 04/25/2020 1619   LDLCALC 107 (H) 04/25/2020 1619   LDLDIRECT 86.0 06/24/2019 1204    Physical Exam:    VS:  BP 120/80 (BP Location: Left Arm, Patient Position: Sitting, Cuff Size: Normal)   Pulse (!) 55   Ht 5\' 6"  (1.676 m)   Wt 214 lb (97.1 kg)   SpO2 95%   BMI 34.54 kg/m     Wt Readings from Last 3 Encounters:  08/29/20 214 lb (97.1 kg)  04/27/20 214 lb 3.2 oz (97.2 kg)  02/22/20 210 lb (95.3 kg)     GEN: Well nourished, well developed in no acute distress HEENT: Normal NECK: No JVD; No carotid bruits LYMPHATICS: No lymphadenopathy CARDIAC: S1S2 noted,RRR, no murmurs, rubs, gallops RESPIRATORY:  Clear to auscultation without  rales, wheezing or rhonchi  ABDOMEN: Soft, non-tender, non-distended, +bowel sounds, no guarding. EXTREMITIES: No edema, No cyanosis, no clubbing MUSCULOSKELETAL:  No deformity   SKIN: Warm and dry NEUROLOGIC:  Alert and oriented x 3, non-focal PSYCHIATRIC:  Normal affect, good insight  ASSESSMENT:    1. CAD in native artery   2. RENAL ARTERY STENOSIS   3. Pulmonary hypertension (HCC)   4. Obesity (BMI 30-39.9)   5. Cough    PLAN:      He is doing well from a cardiovascular standpoint.  He is to continue on his current medication regimen.  No angina symptoms for his coronary artery disease. His blood pressure is acceptable no changes will be made.  The patient understands the need to lose weight with diet and exercise. We have discussed specific strategies for this.  This is being managed by his primary care doctor.  No adjustments for antidiabetic medications were made today.  He is having intermittent coughing which had been relief in the past with Ladona Ridgel we will send refills for the patient as a one-time courtesy while he wait to see his PCP.  The patient is in agreement with the above plan. The patient left the office in stable condition.  The patient will follow up in 6 months or sooner if needed   Medication Adjustments/Labs and Tests Ordered: Current medicines are reviewed at length with the patient today.  Concerns regarding medicines are outlined above.  No orders of the defined types were placed in this encounter.  Meds ordered this encounter  Medications  . benzonatate (TESSALON PERLES) 100 MG capsule    Sig: Take 1 capsule (100 mg total) by mouth 3 (three) times daily as needed for cough.    Dispense:  90 capsule    Refill:  0    Patient Instructions  Medication Instructions:  Your physician recommends that you continue on your current medications as directed. Please refer to the Current Medication list given to you today.  *If you need a refill on your cardiac medications before your next appointment, please call your pharmacy*   Lab Work: NONE If you have labs (blood work) drawn today and your tests are completely  normal, you will receive your results only by: Marland Kitchen MyChart Message (if you have MyChart) OR . A paper copy in the mail If you have any lab test that is abnormal or we need to change your treatment, we will call you to review the results.   Testing/Procedures: NONE   Follow-Up: At [facility], you and your health needs are our priority.  As part of our continuing mission to provide you with exceptional heart care, we have created designated Provider Care Teams.  These Care Teams include your primary Cardiologist (physician) and Advanced Practice Providers (APPs -  Physician Assistants and Nurse Practitioners) who all work together to provide you with the care you need, when you need it.  We recommend signing up for the patient portal called "MyChart".  Sign up information is provided on this After Visit Summary.  MyChart is used to connect with patients for Virtual Visits (Telemedicine).  Patients are able to view lab/test results, encounter notes, upcoming appointments, etc.  Non-urgent messages can be sent to your provider as well.   To learn more about what you can do with MyChart, go to [email or web address].    Your next appointment:   6 month(s)  The format for your next appointment:   In Person  Provider:  Berniece Salines, DO   Other Instructions      Adopting a Healthy Lifestyle.  Know what a healthy weight is for you (roughly BMI <25) and aim to maintain this   Aim for 7+ servings of fruits and vegetables daily   65-80+ fluid ounces of water or unsweet tea for healthy kidneys   Limit to max 1 drink of alcohol per day; avoid smoking/tobacco   Limit animal fats in diet for cholesterol and heart health - choose grass fed whenever available   Avoid highly processed foods, and foods high in saturated/trans fats   Aim for low stress - take time to unwind and care for your mental health   Aim for 150 min of moderate intensity exercise weekly for heart health, and  weights twice weekly for bone health   Aim for 7-9 hours of sleep daily   When it comes to diets, agreement about the perfect plan isnt easy to find, even among the experts. Experts at the [employer] developed an idea known as the Healthy Eating Plate. Just imagine a plate divided into logical, healthy portions.   The emphasis is on diet quality:   Load up on vegetables and fruits - one-half of your plate: Aim for color and variety, and remember that potatoes dont count.   Go for whole grains - one-quarter of your plate: Whole wheat, barley, wheat berries, quinoa, oats, brown rice, and foods made with them. If you want pasta, go with whole wheat pasta.   Protein power - one-quarter of your plate: Fish, chicken, beans, and nuts are all healthy, versatile protein sources. Limit red meat.   The diet, however, does go beyond the plate, offering a few other suggestions.   Use healthy plant oils, such as olive, canola, soy, corn, sunflower and peanut. Check the labels, and avoid partially hydrogenated oil, which have unhealthy trans fats.   If youre thirsty, drink water. Coffee and tea are good in moderation, but skip sugary drinks and limit milk and dairy products to one or two daily servings.   The type of carbohydrate in the diet is more important than the amount. Some sources of carbohydrates, such as vegetables, fruits, whole grains, and beans-are healthier than others.   Finally, stay active  Signed, Berniece Salines, DO  08/29/2020 5:08 PM    [facility]

## 2020-09-11 DIAGNOSIS — M5416 Radiculopathy, lumbar region: Secondary | ICD-10-CM | POA: Diagnosis not present

## 2020-09-11 DIAGNOSIS — M961 Postlaminectomy syndrome, not elsewhere classified: Secondary | ICD-10-CM | POA: Diagnosis not present

## 2020-09-11 DIAGNOSIS — M5136 Other intervertebral disc degeneration, lumbar region: Secondary | ICD-10-CM | POA: Diagnosis not present

## 2020-09-11 DIAGNOSIS — M545 Low back pain, unspecified: Secondary | ICD-10-CM | POA: Diagnosis not present

## 2020-09-18 DIAGNOSIS — C44629 Squamous cell carcinoma of skin of left upper limb, including shoulder: Secondary | ICD-10-CM | POA: Diagnosis not present

## 2020-09-18 DIAGNOSIS — L570 Actinic keratosis: Secondary | ICD-10-CM | POA: Diagnosis not present

## 2020-10-01 DIAGNOSIS — M4856XD Collapsed vertebra, not elsewhere classified, lumbar region, subsequent encounter for fracture with routine healing: Secondary | ICD-10-CM | POA: Diagnosis not present

## 2020-10-01 DIAGNOSIS — I700 Atherosclerosis of aorta: Secondary | ICD-10-CM | POA: Diagnosis not present

## 2020-10-02 ENCOUNTER — Other Ambulatory Visit: Payer: Self-pay | Admitting: Physical Medicine and Rehabilitation

## 2020-10-02 DIAGNOSIS — M545 Low back pain, unspecified: Secondary | ICD-10-CM

## 2020-10-03 DIAGNOSIS — R338 Other retention of urine: Secondary | ICD-10-CM | POA: Diagnosis not present

## 2020-10-03 DIAGNOSIS — C679 Malignant neoplasm of bladder, unspecified: Secondary | ICD-10-CM | POA: Diagnosis not present

## 2020-10-13 ENCOUNTER — Ambulatory Visit
Admission: RE | Admit: 2020-10-13 | Discharge: 2020-10-13 | Disposition: A | Discharge status: Home / Self Care | Payer: Medicare Other | Source: Ambulatory Visit | Attending: Physical Medicine and Rehabilitation | Admitting: Physical Medicine and Rehabilitation

## 2020-10-13 ENCOUNTER — Other Ambulatory Visit: Payer: Self-pay

## 2020-10-13 DIAGNOSIS — M545 Low back pain, unspecified: Secondary | ICD-10-CM | POA: Diagnosis not present

## 2020-10-16 DIAGNOSIS — M5416 Radiculopathy, lumbar region: Secondary | ICD-10-CM | POA: Diagnosis not present

## 2020-10-16 DIAGNOSIS — M5136 Other intervertebral disc degeneration, lumbar region: Secondary | ICD-10-CM | POA: Diagnosis not present

## 2020-10-16 DIAGNOSIS — S32009D Unspecified fracture of unspecified lumbar vertebra, subsequent encounter for fracture with routine healing: Secondary | ICD-10-CM | POA: Diagnosis not present

## 2020-10-23 DIAGNOSIS — C670 Malignant neoplasm of trigone of bladder: Secondary | ICD-10-CM | POA: Diagnosis not present

## 2020-10-23 DIAGNOSIS — R300 Dysuria: Secondary | ICD-10-CM | POA: Diagnosis not present

## 2020-10-24 ENCOUNTER — Ambulatory Visit (INDEPENDENT_AMBULATORY_CARE_PROVIDER_SITE_OTHER): Payer: Medicare Other

## 2020-10-24 ENCOUNTER — Other Ambulatory Visit: Payer: Self-pay

## 2020-10-24 ENCOUNTER — Other Ambulatory Visit: Payer: Self-pay | Admitting: Internal Medicine

## 2020-10-24 ENCOUNTER — Ambulatory Visit (INDEPENDENT_AMBULATORY_CARE_PROVIDER_SITE_OTHER): Payer: Medicare Other | Admitting: Internal Medicine

## 2020-10-24 ENCOUNTER — Encounter: Payer: Self-pay | Admitting: Internal Medicine

## 2020-10-24 VITALS — BP 124/80 | HR 71 | Temp 98.1°F | Ht 66.0 in | Wt 213.0 lb

## 2020-10-24 DIAGNOSIS — I10 Essential (primary) hypertension: Secondary | ICD-10-CM

## 2020-10-24 DIAGNOSIS — I700 Atherosclerosis of aorta: Secondary | ICD-10-CM | POA: Diagnosis not present

## 2020-10-24 DIAGNOSIS — E538 Deficiency of other specified B group vitamins: Secondary | ICD-10-CM | POA: Diagnosis not present

## 2020-10-24 DIAGNOSIS — I701 Atherosclerosis of renal artery: Secondary | ICD-10-CM

## 2020-10-24 DIAGNOSIS — R0602 Shortness of breath: Secondary | ICD-10-CM | POA: Diagnosis not present

## 2020-10-24 DIAGNOSIS — E559 Vitamin D deficiency, unspecified: Secondary | ICD-10-CM | POA: Diagnosis not present

## 2020-10-24 DIAGNOSIS — R7302 Impaired glucose tolerance (oral): Secondary | ICD-10-CM | POA: Diagnosis not present

## 2020-10-24 DIAGNOSIS — N1831 Chronic kidney disease, stage 3a: Secondary | ICD-10-CM

## 2020-10-24 DIAGNOSIS — R059 Cough, unspecified: Secondary | ICD-10-CM

## 2020-10-24 DIAGNOSIS — I2510 Atherosclerotic heart disease of native coronary artery without angina pectoris: Secondary | ICD-10-CM | POA: Diagnosis not present

## 2020-10-24 LAB — CBC WITH DIFFERENTIAL/PLATELET
Basophils Absolute: 0.1 K/uL (ref 0.0–0.1)
Basophils Relative: 0.8 % (ref 0.0–3.0)
Eosinophils Absolute: 0.2 K/uL (ref 0.0–0.7)
Eosinophils Relative: 2.9 % (ref 0.0–5.0)
HCT: 39 % (ref 39.0–52.0)
Hemoglobin: 12.7 g/dL — ABNORMAL LOW (ref 13.0–17.0)
Lymphocytes Relative: 18.4 % (ref 12.0–46.0)
Lymphs Abs: 1.5 K/uL (ref 0.7–4.0)
MCHC: 32.5 g/dL (ref 30.0–36.0)
MCV: 82.2 fl (ref 78.0–100.0)
Monocytes Absolute: 0.7 K/uL (ref 0.1–1.0)
Monocytes Relative: 9.5 % (ref 3.0–12.0)
Neutro Abs: 5.4 K/uL (ref 1.4–7.7)
Neutrophils Relative %: 68.4 % (ref 43.0–77.0)
Platelets: 264 K/uL (ref 150.0–400.0)
RBC: 4.75 Mil/uL (ref 4.22–5.81)
RDW: 15.7 % — ABNORMAL HIGH (ref 11.5–15.5)
WBC: 7.9 K/uL (ref 4.0–10.5)

## 2020-10-24 LAB — URINALYSIS, ROUTINE W REFLEX MICROSCOPIC
Bilirubin Urine: NEGATIVE
Nitrite: POSITIVE — AB
Specific Gravity, Urine: >=1.03 — AB (ref 1.000–1.030)
Urine Glucose: NEGATIVE
Urobilinogen, UA: 0.2 (ref 0.0–1.0)
pH: 5 (ref 5.0–8.0)

## 2020-10-24 LAB — LIPID PANEL
Cholesterol: 137 mg/dL (ref 0–200)
HDL: 31.2 mg/dL — ABNORMAL LOW (ref >39.00)
LDL Cholesterol: 80 mg/dL (ref 0–99)
NonHDL: 106.17
Total CHOL/HDL Ratio: 4
Triglycerides: 131 mg/dL (ref 0.0–149.0)
VLDL: 26.2 mg/dL (ref 0.0–40.0)

## 2020-10-24 LAB — BASIC METABOLIC PANEL
BUN: 12 mg/dL (ref 6–23)
CO2: 29 mEq/L (ref 19–32)
Calcium: 9 mg/dL (ref 8.4–10.5)
Chloride: 102 mEq/L (ref 96–112)
Creatinine, Ser: 1.03 mg/dL (ref 0.40–1.50)
GFR: 67.29 mL/min (ref >60.00)
Glucose, Bld: 76 mg/dL (ref 70–99)
Potassium: 4.1 mEq/L (ref 3.5–5.1)
Sodium: 138 mEq/L (ref 135–145)

## 2020-10-24 LAB — TSH: TSH: 2.53 u[IU]/mL (ref 0.35–4.50)

## 2020-10-24 LAB — VITAMIN B12: Vitamin B-12: 998 pg/mL — ABNORMAL HIGH (ref 211–911)

## 2020-10-24 LAB — HEPATIC FUNCTION PANEL
ALT: 16 U/L (ref 0–53)
AST: 15 U/L (ref 0–37)
Albumin: 4.1 g/dL (ref 3.5–5.2)
Alkaline Phosphatase: 104 U/L (ref 39–117)
Bilirubin, Direct: 0.1 mg/dL (ref 0.0–0.3)
Total Bilirubin: 0.3 mg/dL (ref 0.2–1.2)
Total Protein: 7 g/dL (ref 6.0–8.3)

## 2020-10-24 LAB — HEMOGLOBIN A1C: Hgb A1c MFr Bld: 6.1 % (ref 4.6–6.5)

## 2020-10-24 LAB — VITAMIN D 25 HYDROXY (VIT D DEFICIENCY, FRACTURES): VITD: 41.93 ng/mL (ref 30.00–100.00)

## 2020-10-24 MED ORDER — BENZONATATE 100 MG PO CAPS
100.0000 mg | ORAL_CAPSULE | Freq: Three times a day (TID) | ORAL | 1 refills | Status: DC | PRN
Start: 2020-10-24 — End: 2021-01-22

## 2020-10-24 MED ORDER — CIPROFLOXACIN HCL 500 MG PO TABS: 500.0000 mg | ORAL_TABLET | Freq: Two times a day (BID) | ORAL | 0 refills | Status: AC

## 2020-10-24 NOTE — Patient Instructions (Signed)
Please continue all other medications as before, and refills have been done if requested.  Please have the pharmacy call with any other refills you may need.  Please continue your efforts at being more active, low cholesterol diet, and weight control.  You are otherwise up to date with prevention measures today.  Please keep your appointments with your specialists as you may have planned  Please go to the XRAY Department in the first floor for the x-ray testing  Please go to the LAB at the blood drawing area for the tests to be done  You will be contacted by phone if any changes need to be made immediately.  Otherwise, you will receive a letter about your results with an explanation, but please check with MyChart first.  Please remember to sign up for MyChart if you have not done so, as this will be important to you in the future with finding out test results, communicating by private email, and scheduling acute appointments online when needed.  Please make an Appointment to return in 6 months, or sooner if needed 

## 2020-10-24 NOTE — Progress Notes (Signed)
Patient ID: [name], male   DOB: 05-14-1936, 85 y.o.   MRN: [id number]         Chief Complaint:: yearly exam and cough, hyperglycemia, htn, ckd, aortic atherosclerosis       HPI:  [name] is a 85 y.o. male here with son for support, wearing his chronic back brace and rollater type walker per the [place].  Has hx of recent bronchitis overall improved, but still with persistent scant prod cough - Pt denies chest pain, increased sob or doe, wheezing, orthopnea, PND, increased LE swelling, palpitations, dizziness or syncope.   Pt denies polydipsia, polyuria, Denies new neuro s/s.   Pt denies fever, wt loss, night sweats, loss of appetite, or other constitutional symptoms  Has no other specific complaints.  Is o/w up to date with preventive referrals and immunizations.   Wt Readings from Last 3 Encounters:  10/24/20 213 lb (96.6 kg)  08/29/20 214 lb (97.1 kg)  04/27/20 214 lb 3.2 oz (97.2 kg)   BP Readings from Last 3 Encounters:  10/24/20 124/80  08/29/20 120/80  04/27/20 126/70   Immunization History  Administered Date(s) Administered  . Fluad Quad(high Dose 65+) 04/25/2020  . Influenza Split 03/14/2012  . Influenza Whole 05/17/2007, 04/19/2013  . Influenza, High Dose Seasonal PF 04/14/2017, 04/20/2018  . Influenza,inj,Quad PF,6+ Mos 04/05/2014, 06/04/2016  . Influenza,inj,quad, With Preservative 04/12/2018  . Influenza-Unspecified 05/14/2015  . Pneumococcal Conjugate-13 05/16/2014  . Pneumococcal Polysaccharide-23 05/17/2007  . Tdap 11/28/2010  . Unspecified SARS-COV-2 Vaccination 09/12/2019, 10/10/2019  . Zoster 10/25/2013  There are no preventive care reminders to display for this patient.    Past Medical History:  Diagnosis Date  . Acute bronchitis per pt no fever--  cough since discharge from hospital 07-02-2015   per pcp note 07-05-2015  mild to moderate bronchitis vs pna  . Allergic rhinitis   . Bladder cancer ([place])   . BPH (benign prostatic hyperplasia)   .  Bradycardia   . CAD (coronary artery disease)   . Chronic low back pain   . DDD (degenerative disc disease), lumbosacral   . Depression   . Diverticulosis of colon (without mention of hemorrhage)   . Dyspnea   . Essential tremor   . Fatty liver disease, nonalcoholic   . GERD (gastroesophageal reflux disease)   . Glaucoma   . History of bladder cancer    2004--  TCC low-grade , non-invasive  . History of colon polyps   . History of exercise stress test    02-05-2007-- ETT (Clinically positive, electrically equivocal, submaximal ETT,  appropriate bp response to exercise  . History of hiatal hernia   . History of kidney stones   . History of peptic ulcer   . HLD (hyperlipidemia)   . HTN (hypertension)   . Nocturia   . Productive cough   . Rheumatoid arthritis(714.0)   . Right renal artery stenosis (HCC)    per cath 03-14-2009 --- 40-50%  . RLS (restless legs syndrome)   . Vertigo, intermittent   . Weak urinary stream   . Wears dentures    upper  . Wears glasses   . Wears hearing aid    bilateral  . Wears partial dentures    lower   Past Surgical History:  Procedure Laterality Date  . CARDIAC CATHETERIZATION  02-12-2007  dr gregg taylor   Non-obstructive CAD,  20% pLCX,  20% LAD, ef 65%  . CARDIAC CATHETERIZATION  03-14-2009  dr Johnsie Cancel   pLAD  and mid diaginal 20-30% multiple lesions,  OM1 20%, right renal ostial 40-50%,  ef 60%  . CATARACT EXTRACTION W/ INTRAOCULAR LENS  IMPLANT, BILATERAL  2016  . CYSTOSCOPY WITH BIOPSY N/A 07/11/2015   Procedure: CYSTOSCOPY WITH COLD CUP RESECTION AND FULGERATION;  Surgeon: Rana Snare, MD;  Location: [facility];  Service: Urology;  Laterality: N/A;  . HIP ARTHROSCOPY W/ LABRAL DEBRIDEMENT Left 06-29-2000   and chondraplasty  . LUMBAR [place] SURGERY  07-16-2005  . LUMBAR LAMINECTOMY/DECOMPRESSION MICRODISCECTOMY  07/09/2011   Procedure: LUMBAR LAMINECTOMY/DECOMPRESSION MICRODISCECTOMY;  Surgeon: Johnn Hai;   Location: [facility];  Service: Orthopedics;  Laterality: Left;  Decompression L5 - S1 on the Left and repair of dura  . LUMBAR LAMINECTOMY/DECOMPRESSION MICRODISCECTOMY N/A 07/27/2013   Procedure: DECOMPRESSION L4 - L5 WITH EXCISION OF SYNOVIAL CYST AND, LATERAL MASS FUSION 1 LEVEL;  Surgeon: Johnn Hai, MD;  Location: [facility];  Service: Orthopedics;  Laterality: N/A;  . LUMBAR LAMINECTOMY/DECOMPRESSION MICRODISCECTOMY Left 07/06/2014   Procedure:  MICRO LUMBAR DECOMPRESSION L5-S1 ON LEFT, L4-5 REDO;  Surgeon: Johnn Hai, MD;  Location: [facility];  Service: Orthopedics;  Laterality: Left;  . SPINAL CORD STIMULATOR INSERTION N/A 09/11/2016   Procedure: Spinal cord stimulator placement;  Surgeon: Melina Schools, MD;  Location: [place];  Service: Orthopedics;  Laterality: N/A;  . TONSILLECTOMY AND ADENOIDECTOMY    . TRANSTHORACIC ECHOCARDIOGRAM  09-20-2010   normal LVF, ef 55-65%/  mild MR, TR and PR/  mild LAE  . TRANSURETHRAL RESECTION OF BLADDER TUMOR  09-21-2002    reports that he quit smoking about 38 years ago. His smoking use included cigarettes. He has a 40.00 pack-year smoking history. He has never used smokeless tobacco. He reports current alcohol use. He reports that he does not use drugs. family history includes Cancer in his brother, father, and sister; Diabetes in his sister; Heart attack in his mother. Allergies  Allergen Reactions  . Morphine And Related Other (See Comments)    Migraine   . Sulfa Antibiotics Rash and Itching    Patient  Is ok to use paper tape  . Hydrochlorothiazide Other (See Comments)    REACTION: hyponatremia with daily use  . Tape Itching    Patient  Is ok to use paper tape  . Hytrin [Terazosin] Other (See Comments)    Dizziness  . Sulfur Dioxide   . Latex Rash  . Lisinopril Cough   Current Outpatient Medications on File Prior to Visit  Medication Sig Dispense Refill  . acetaminophen (TYLENOL) 500 MG tablet Take 1,000 mg by mouth every 6 (six) hours as  needed for moderate pain.    Marland Kitchen albuterol (PROVENTIL) (2.5 MG/3ML) 0.083% nebulizer solution Take 3 mLs (2.5 mg total) by nebulization every 6 (six) hours as needed for wheezing or shortness of breath. 150 mL 1  . albuterol (VENTOLIN HFA) 108 (90 Base) MCG/ACT inhaler Inhale 2 puffs into the lungs every 6 (six) hours as needed for wheezing or shortness of breath. 18 g 11  . amLODipine (NORVASC) 10 MG tablet TAKE 1 TABLET (10 MG TOTAL) DAILY. 90 tablet 1  . ascorbic acid (VITAMIN C) 500 MG tablet Take 1 tablet (500 mg total) by mouth daily. 30 tablet 0  . brimonidine (ALPHAGAN) 0.15 % ophthalmic solution Place 1 drop into both eyes 2 (two) times daily.    . budesonide-formoterol (SYMBICORT) 160-4.5 MCG/ACT inhaler Inhale 2 puffs into the lungs 2 (two) times daily. 1 Inhaler 3  . cetirizine (ZYRTEC)  10 MG tablet Take 10 mg by mouth daily.    . cholecalciferol (VITAMIN D3) 25 MCG (1000 UNIT) tablet Take 1,000 Units by mouth daily.    . citalopram (CELEXA) 20 MG tablet Take 1 tablet (20 mg total) by mouth daily. 90 tablet 3  . clonazePAM (KLONOPIN) 0.5 MG tablet TAKE 1 TABLET(0.5 MG) BY MOUTH TWICE DAILY AS NEEDED FOR ANXIETY 60 tablet 2  . clotrimazole (LOTRIMIN) 1 % cream Apply 1 application topically 2 (two) times daily as needed (FOR RASH). Prescribed for rash in groin    . dextromethorphan-guaiFENesin (MUCINEX DM) 30-600 MG 12hr tablet Take 1 tablet by mouth 2 (two) times daily.    . fluticasone (FLONASE) 50 MCG/ACT nasal spray USE 2 SPRAYS IN EACH NOSTRIL DAILY 48 g 1  . furosemide (LASIX) 20 MG tablet TAKE 1 TABLET BY MOUTH DAILY FOR ONE WEEK THEN TAKE 1 TABLET ONLY ON TUESDAYS AND SATURDAYS 30 tablet 1  . gabapentin (NEURONTIN) 300 MG capsule TAKE 1 TO 2 CAPSULES FOUR TIMES DAILY  AFTER MEALS AND AT BEDTIME. 720 capsule 3  . hydrALAZINE (APRESOLINE) 10 MG tablet TAKE 1 TABLET THREE TIMES DAILY AS NEEDED FOR BLOOD PRESSURE MORE THAN 150/90 270 tablet 0  . HYDROcodone-acetaminophen (NORCO/VICODIN)  5-325 MG tablet Take 1 tablet by mouth every 6 (six) hours as needed for severe pain. 10 tablet 0  . hydroxypropyl methylcellulose / hypromellose (ISOPTO TEARS / GONIOVISC) 2.5 % ophthalmic solution Place 1 drop into both eyes 2 (two) times daily.    . Insulin Pen Needle 29G X 10MM MISC Use as directed 200 each 0  . LORazepam (ATIVAN) 1 MG tablet     . losartan (COZAAR) 100 MG tablet TAKE 1 TABLET EVERY DAY 90 tablet 2  . meclizine (ANTIVERT) 25 MG tablet TAKE 1 TABLET TWICE DAILY AS NEEDED FOR DIZZINESS 180 tablet 1  . methocarbamol (ROBAXIN) 500 MG tablet Take 500 mg by mouth 3 (three) times daily as needed for muscle spasms.    . nitroGLYCERIN (NITROSTAT) 0.4 MG SL tablet PLACE 1 TABLET UNDER THE TONGUE EVERY 5 MINUTES AS NEEDED FOR CHEST PAIN. MAX 3 DOSES. CALL 911 IF NO IMPROVEMENT AFTER 1ST DOSE 25 tablet 0  . Nutritional Supplements (JUICE PLUS FIBRE PO) Take 1 each 2 (two) times daily by mouth.    Marland Kitchen omeprazole (PRILOSEC) 40 MG capsule TAKE 1 CAPSULE TWICE DAILY 180 capsule 3  . ondansetron (ZOFRAN) 4 MG tablet TAKE 1 TABLET EVERY 8 HOURS AS NEEDED FOR NAUSEA OR VOMITING 30 tablet 2  . potassium chloride (KLOR-CON) 10 MEQ tablet Take one tablet daily for 1 week then take one tablet on on tuesdays and saturdays. 30 tablet 1  . primidone (MYSOLINE) 250 MG tablet Take 1 tablet (250 mg total) by mouth 4 (four) times daily. 360 tablet 3  . simvastatin (ZOCOR) 40 MG tablet TAKE 1 AND 1/2 TABLETS AT BEDTIME 135 tablet 1  . tamsulosin (FLOMAX) 0.4 MG CAPS capsule Take 1 capsule (0.4 mg total) by mouth daily. 90 capsule 3  . timolol (TIMOPTIC) 0.5 % ophthalmic solution Place 1 drop into both eyes 2 (two) times daily.   1  . Travoprost, BAK Free, (TRAVATAN Z) 0.004 % SOLN ophthalmic solution Place 1 drop into both eyes at bedtime. 5 mL 5  . triamcinolone (KENALOG) 0.025 % cream Apply 1 application topically 2 (two) times daily as needed (rash).     . [DISCONTINUED] insulin aspart (NOVOLOG) 100 UNIT/ML  FlexPen Inject 4 Units into  the skin 3 (three) times daily with meals. (Patient not taking: Reported on 01/19/2020) 6 mL 0  . [DISCONTINUED] Insulin Detemir (LEVEMIR) 100 UNIT/ML Pen Inject 5 Units into the skin 2 (two) times daily. (Patient not taking: Reported on 01/19/2020) 6 mL 0  . [DISCONTINUED] ipratropium-albuterol (DUONEB) 0.5-2.5 (3) MG/3ML SOLN Take 3 mLs by nebulization every 6 (six) hours as needed. (Patient not taking: Reported on 01/19/2020) 360 mL 0   No current facility-administered medications on file prior to visit.        ROS:  All others reviewed and negative.  Objective        PE:  BP 124/80 (BP Location: Left Arm, Patient Position: Sitting, Cuff Size: Normal)   Pulse 71   Temp 98.1 F (36.7 C) (Oral)   Ht 5\' 6"  (1.676 m)   Wt 213 lb (96.6 kg)   SpO2 95%   BMI 34.38 kg/m                 Constitutional: Pt appears in NAD               HENT: Head: NCAT.                Right Ear: External ear normal.                 Left Ear: External ear normal.                Eyes: . Pupils are equal, round, and reactive to light. Conjunctivae and EOM are normal               Nose: without d/c or deformity               Neck: Neck supple. Gross normal ROM               Cardiovascular: Normal rate and regular rhythm.                 Pulmonary/Chest: Effort normal and breath sounds without rales or wheezing.                Abd:  Soft, NT, ND, + BS, no organomegaly               Neurological: Pt is alert. At baseline orientation, motor grossly intact               Skin: Skin is warm. No rashes, no other new lesions, LE edema - trace bilat               Psychiatric: Pt behavior is normal without agitation   Micro: none  Cardiac tracings I have personally interpreted today:  none  Pertinent Radiological findings (summarize): none   Lab Results  Component Value Date   WBC 7.9 10/24/2020   HGB 12.7 (L) 10/24/2020   HCT 39.0 10/24/2020   PLT 264.0 10/24/2020   GLUCOSE 76  10/24/2020   CHOL 137 10/24/2020   TRIG 131.0 10/24/2020   HDL 31.20 (L) 10/24/2020   LDLDIRECT 86.0 06/24/2019   LDLCALC 80 10/24/2020   ALT 16 10/24/2020   AST 15 10/24/2020   NA 138 10/24/2020   K 4.1 10/24/2020   CL 102 10/24/2020   CREATININE 1.03 10/24/2020   BUN 12 10/24/2020   CO2 29 10/24/2020   TSH 2.53 10/24/2020   PSA 0.53 11/12/2016   INR 1.15 02/23/2018   HGBA1C 6.1 10/24/2020   Assessment/Plan:  IMAAD REUSS is a 85  y.o. [place]in or Caucasian [1] male with  has a past medical history of Acute bronchitis (per pt no fever--  cough since discharge from hospital 07-02-2015), Allergic rhinitis, Bladder cancer ([place]), BPH (benign prostatic hyperplasia), Bradycardia, CAD (coronary artery disease), Chronic low back pain, DDD (degenerative disc disease), lumbosacral, Depression, Diverticulosis of colon (without mention of hemorrhage), Dyspnea, Essential tremor, Fatty liver disease, nonalcoholic, GERD (gastroesophageal reflux disease), Glaucoma, History of bladder cancer, History of colon polyps, History of exercise stress test, History of hiatal hernia, History of kidney stones, History of peptic ulcer, HLD (hyperlipidemia), HTN (hypertension), Nocturia, Productive cough, Rheumatoid arthritis(714.0), Right renal artery stenosis (HCC), RLS (restless legs syndrome), Vertigo, intermittent, Weak urinary stream, Wears dentures, Wears glasses, Wears hearing aid, and Wears partial dentures.  Aortic atherosclerosis ([place]) To continue statin zocor 40 and low chol diet   Impaired glucose tolerance Lab Results  Component Value Date   HGBA1C 6.1 10/24/2020   Stable, pt to continue current medical treatment  - diet   Essential hypertension BP Readings from Last 3 Encounters:  10/24/20 124/80  08/29/20 120/80  04/27/20 126/70   Stable, pt to continue medical treatment norvasc, hydralazine, losartan   Cough ? Acute on chronic persistent - for cxr,  to f/u any worsening symptoms or  concerns  CKD (chronic kidney disease) stage 3, GFR 30-59 ml/min Lab Results  Component Value Date   CREATININE 1.03 10/24/2020   Stable overall, cont to avoid nephrotoxins   Followup: Return in about 6 months (around 04/25/2021).  Cathlean Cower, MD 10/27/2020 8:42 PM [facility]

## 2020-10-25 ENCOUNTER — Encounter: Payer: Self-pay | Admitting: Internal Medicine

## 2020-10-25 NOTE — Telephone Encounter (Signed)
    Patient called back, states his Urologist has already prescribed medication for urinary infection. Patient requesting his son [place] be contacted to discuss lab results. [place] is listed on [place]

## 2020-10-27 ENCOUNTER — Encounter: Payer: Self-pay | Admitting: Internal Medicine

## 2020-10-27 NOTE — Assessment & Plan Note (Signed)
BP Readings from Last 3 Encounters:  10/24/20 124/80  08/29/20 120/80  04/27/20 126/70   Stable, pt to continue medical treatment norvasc, hydralazine, losartan

## 2020-10-27 NOTE — Assessment & Plan Note (Signed)
?   Acute on chronic persistent - for cxr,  to f/u any worsening symptoms or concerns

## 2020-10-27 NOTE — Assessment & Plan Note (Signed)
Lab Results  Component Value Date   CREATININE 1.03 10/24/2020   Stable overall, cont to avoid nephrotoxins

## 2020-10-27 NOTE — Assessment & Plan Note (Addendum)
To continue statin zocor 40 and low chol diet

## 2020-10-27 NOTE — Assessment & Plan Note (Signed)
Lab Results  Component Value Date   HGBA1C 6.1 10/24/2020   Stable, pt to continue current medical treatment  - diet

## 2020-11-01 ENCOUNTER — Other Ambulatory Visit: Payer: Self-pay | Admitting: Internal Medicine

## 2020-11-01 ENCOUNTER — Telehealth: Payer: Self-pay

## 2020-11-01 NOTE — Telephone Encounter (Signed)
Pt son was notified of results

## 2020-11-01 NOTE — Telephone Encounter (Signed)
Please refill as per office routine med refill policy (all routine meds refilled for 3 mo or monthly per pt preference up to one year from last visit, then month to month grace period for 3 mo, then further med refills will have to be denied)  

## 2020-11-12 ENCOUNTER — Ambulatory Visit (INDEPENDENT_AMBULATORY_CARE_PROVIDER_SITE_OTHER): Payer: Medicare Other | Admitting: Podiatry

## 2020-11-12 ENCOUNTER — Other Ambulatory Visit: Payer: Self-pay

## 2020-11-12 ENCOUNTER — Encounter: Payer: Self-pay | Admitting: Podiatry

## 2020-11-12 DIAGNOSIS — M79676 Pain in unspecified toe(s): Secondary | ICD-10-CM | POA: Diagnosis not present

## 2020-11-12 DIAGNOSIS — B351 Tinea unguium: Secondary | ICD-10-CM

## 2020-11-15 NOTE — Progress Notes (Signed)
  Subjective:  Patient ID: [name], male    DOB: 12/21/1935,  MRN: [id number]  85 y.o. male presents painful thick toenails that are difficult to trim. Pain interferes with ambulation. Aggravating factors include wearing enclosed shoe gear. Pain is relieved with periodic professional debridement.   He states he takes CBD oil, Vitamin B12 and gabapentin for chronic back pain. All prescribed by his doctor at the [facility]. Patient states this has worked better than opioids for him.   PCP is Dr. Cathlean Cower and last visit was 10/24/2020.  Allergies  Allergen Reactions  . Morphine And Related Other (See Comments)    Migraine   . Sulfa Antibiotics Rash and Itching    Patient  Is ok to use paper tape  . Hydrochlorothiazide Other (See Comments)    REACTION: hyponatremia with daily use  . Tape Itching    Patient  Is ok to use paper tape  . Hytrin [Terazosin] Other (See Comments)    Dizziness  . Sulfur Dioxide   . Latex Rash  . Lisinopril Cough   Review of Systems: Negative except as noted in the HPI.   Objective:   Constitutional Pt is a pleasant 85 y.o. Caucasian male in NAD. AAO x 3.   Vascular Capillary refill time to digits immediate b/l. Palpable pedal pulses b/l LE. Pedal hair present. Lower extremity skin temperature gradient within normal limits.  Neurologic Protective sensation intact 5/5 intact bilaterally with 10g monofilament b/l.  Dermatologic Pedal skin with normal turgor, texture and tone bilaterally. No open wounds bilaterally. No interdigital macerations bilaterally. Toenails 1-5 b/l elongated, discolored, dystrophic, thickened, crumbly with subungual debris and tenderness to dorsal palpation.  Orthopedic: Normal muscle strength 5/5 to all lower extremity muscle groups bilaterally. No pain crepitus or joint limitation noted with ROM b/l. No gross bony deformities bilaterally.   Radiographs: None Assessment:   1. Pain due to onychomycosis of toenail     Plan:  Patient was evaluated and treated and all questions answered.  Onychomycosis with pain -Nails palliatively debridement as below. -Educated on self-care  Procedure: Nail Debridement Rationale: Pain Type of Debridement: manual, sharp debridement. Instrumentation: Nail nipper, rotary burr. Number of Nails: 10  -Examined patient. -Patient to continue soft, supportive shoe gear daily. -Toenails 1-5 b/l were debrided in length and girth with sterile nail nippers and dremel without iatrogenic bleeding.  -Patient to report any pedal injuries to medical professional immediately. -Patient/POA to call should there be question/concern in the interim.  Return in about 3 months (around 02/12/2021).  Marzetta Board, DPM

## 2020-11-21 DIAGNOSIS — M5416 Radiculopathy, lumbar region: Secondary | ICD-10-CM | POA: Diagnosis not present

## 2020-12-08 ENCOUNTER — Other Ambulatory Visit: Payer: Self-pay | Admitting: Internal Medicine

## 2020-12-11 DIAGNOSIS — M5416 Radiculopathy, lumbar region: Secondary | ICD-10-CM | POA: Diagnosis not present

## 2020-12-31 DIAGNOSIS — M25552 Pain in left hip: Secondary | ICD-10-CM | POA: Diagnosis not present

## 2020-12-31 DIAGNOSIS — M5459 Other low back pain: Secondary | ICD-10-CM | POA: Diagnosis not present

## 2021-01-10 ENCOUNTER — Other Ambulatory Visit: Payer: Self-pay | Admitting: Internal Medicine

## 2021-01-10 MED ORDER — MECLIZINE HCL 25 MG PO TABS: ORAL_TABLET | ORAL | 1 refills | Status: DC

## 2021-01-10 MED ORDER — LOSARTAN POTASSIUM 100 MG PO TABS: 100.0000 mg | ORAL_TABLET | Freq: Every day | ORAL | 3 refills | Status: DC

## 2021-01-10 MED ORDER — CITALOPRAM HYDROBROMIDE 20 MG PO TABS: 20.0000 mg | ORAL_TABLET | Freq: Every day | ORAL | 3 refills | Status: DC

## 2021-01-11 ENCOUNTER — Other Ambulatory Visit: Payer: Self-pay | Admitting: Cardiology

## 2021-01-11 DIAGNOSIS — H401133 Primary open-angle glaucoma, bilateral, severe stage: Secondary | ICD-10-CM | POA: Diagnosis not present

## 2021-01-15 ENCOUNTER — Other Ambulatory Visit: Payer: Self-pay | Admitting: Internal Medicine

## 2021-01-15 MED ORDER — MECLIZINE HCL 25 MG PO TABS: ORAL_TABLET | ORAL | 2 refills | Status: DC

## 2021-01-15 MED ORDER — CITALOPRAM HYDROBROMIDE 20 MG PO TABS: 20.0000 mg | ORAL_TABLET | Freq: Every day | ORAL | 2 refills | Status: DC

## 2021-01-15 MED ORDER — LOSARTAN POTASSIUM 100 MG PO TABS: 100.0000 mg | ORAL_TABLET | Freq: Every day | ORAL | 2 refills | Status: DC

## 2021-01-22 ENCOUNTER — Other Ambulatory Visit: Payer: Self-pay | Admitting: Internal Medicine

## 2021-01-24 DIAGNOSIS — L814 Other melanin hyperpigmentation: Secondary | ICD-10-CM | POA: Diagnosis not present

## 2021-01-24 DIAGNOSIS — Z85828 Personal history of other malignant neoplasm of skin: Secondary | ICD-10-CM | POA: Diagnosis not present

## 2021-01-24 DIAGNOSIS — L821 Other seborrheic keratosis: Secondary | ICD-10-CM | POA: Diagnosis not present

## 2021-01-24 DIAGNOSIS — D225 Melanocytic nevi of trunk: Secondary | ICD-10-CM | POA: Diagnosis not present

## 2021-01-24 DIAGNOSIS — L820 Inflamed seborrheic keratosis: Secondary | ICD-10-CM | POA: Diagnosis not present

## 2021-01-24 DIAGNOSIS — L905 Scar conditions and fibrosis of skin: Secondary | ICD-10-CM | POA: Diagnosis not present

## 2021-02-04 ENCOUNTER — Encounter: Payer: Self-pay | Admitting: Podiatry

## 2021-02-04 ENCOUNTER — Other Ambulatory Visit: Payer: Self-pay

## 2021-02-04 ENCOUNTER — Ambulatory Visit (INDEPENDENT_AMBULATORY_CARE_PROVIDER_SITE_OTHER): Payer: Medicare Other | Admitting: Podiatry

## 2021-02-04 DIAGNOSIS — R2689 Other abnormalities of gait and mobility: Secondary | ICD-10-CM | POA: Insufficient documentation

## 2021-02-04 DIAGNOSIS — B351 Tinea unguium: Secondary | ICD-10-CM

## 2021-02-04 DIAGNOSIS — H269 Unspecified cataract: Secondary | ICD-10-CM | POA: Insufficient documentation

## 2021-02-04 DIAGNOSIS — M79676 Pain in unspecified toe(s): Secondary | ICD-10-CM | POA: Diagnosis not present

## 2021-02-08 NOTE — Progress Notes (Signed)
Subjective: [name] is a pleasant 85 y.o. male patient seen today painful thick toenails that are difficult to trim. Pain interferes with ambulation. Aggravating factors include wearing enclosed shoe gear. Pain is relieved with periodic professional debridement.  PCP is Biagio Borg, MD. Last visit was: 10/24/2020.  Allergies  Allergen Reactions   Morphine And Related Other (See Comments)    Migraine    Sulfa Antibiotics Rash and Itching    Patient  Is ok to use paper tape   Hydrochlorothiazide Other (See Comments)    REACTION: hyponatremia with daily use   Tape Itching    Patient  Is ok to use paper tape   Hytrin [Terazosin] Other (See Comments)    Dizziness   Sulfur Dioxide    Latex Rash   Lisinopril Cough    Objective: Physical Exam  General: [name] is a pleasant 85 y.o. Caucasian male, in NAD. AAO x 3.   Vascular:  Capillary refill time to digits immediate b/l. Palpable DP pulse(s) b/l lower extremities Palpable PT pulse(s) b/l lower extremities Pedal hair present. Lower extremity skin temperature gradient within normal limits. No pain with calf compression b/l.  Dermatological:  Pedal skin with normal turgor, texture and tone b/l lower extremities. No open wounds b/l lower extremities. No interdigital macerations b/l lower extremities. Toenails 1-5 b/l elongated, discolored, dystrophic, thickened, crumbly with subungual debris and tenderness to dorsal palpation.  Musculoskeletal:  Normal muscle strength 5/5 to all lower extremity muscle groups bilaterally. No pain crepitus or joint limitation noted with ROM b/l. No gross bony deformities bilaterally.  Neurological:  Protective sensation intact 5/5 intact bilaterally with 10g monofilament b/l. Vibratory sensation intact b/l.  Assessment and Plan:  1. Pain due to onychomycosis of toenail    -Examined patient. -Patient to continue soft, supportive shoe gear daily. -Toenails 1-5 b/l were debrided in length and  girth with sterile nail nippers and dremel without iatrogenic bleeding.  -Patient to report any pedal injuries to medical professional immediately. -Patient/POA to call should there be question/concern in the interim.  Return in about 3 months (around 05/07/2021).  Marzetta Board, DPM

## 2021-02-13 ENCOUNTER — Telehealth: Payer: Self-pay | Admitting: *Deleted

## 2021-02-13 ENCOUNTER — Other Ambulatory Visit: Payer: Self-pay | Admitting: Internal Medicine

## 2021-02-13 NOTE — Chronic Care Management (AMB) (Signed)
  Chronic Care Management   Note  02/13/2021 Name: [name] MRN: [id number] DOB: 1935-09-29  [name] is a 85 y.o. year old male who is a primary care patient of Biagio Borg, MD. I reached out to [employer] by phone today in response to a referral sent by [name]'s PCP, Dr. Jenny Reichmann.      Mr. Trowbridge was given information about Chronic Care Management services today including:  CCM service includes personalized support from designated clinical staff supervised by his physician, including individualized plan of care and coordination with other care providers 24/7 contact phone numbers for assistance for urgent and routine care needs. Service will only be billed when office clinical staff spend 20 minutes or more in a month to coordinate care. Only one practitioner may furnish and bill the service in a calendar month. The patient may stop CCM services at any time (effective at the end of the month) by phone call to the office staff. The patient will be responsible for cost sharing (co-pay) of up to 20% of the service fee (after annual deductible is met).  Patient agreed to services and verbal consent obtained.   Follow up plan: Telephone appointment with care management team member scheduled for:03/06/21  Lawrence Mitch  Care Guide, Embedded Care Coordination [place]  Care Management  Direct Dial: [phone]

## 2021-02-28 ENCOUNTER — Ambulatory Visit: Payer: Medicare Other | Admitting: Cardiology

## 2021-03-04 DIAGNOSIS — T8484XA Pain due to internal orthopedic prosthetic devices, implants and grafts, initial encounter: Secondary | ICD-10-CM | POA: Diagnosis not present

## 2021-03-04 DIAGNOSIS — S32009D Unspecified fracture of unspecified lumbar vertebra, subsequent encounter for fracture with routine healing: Secondary | ICD-10-CM | POA: Diagnosis not present

## 2021-03-06 ENCOUNTER — Telehealth: Payer: No Typology Code available for payment source

## 2021-03-07 ENCOUNTER — Encounter: Payer: Self-pay | Admitting: Internal Medicine

## 2021-03-07 ENCOUNTER — Ambulatory Visit (INDEPENDENT_AMBULATORY_CARE_PROVIDER_SITE_OTHER): Payer: Medicare Other | Admitting: Internal Medicine

## 2021-03-07 ENCOUNTER — Other Ambulatory Visit: Payer: Self-pay

## 2021-03-07 VITALS — BP 118/60 | HR 59 | Temp 98.1°F | Ht 66.0 in | Wt 214.4 lb

## 2021-03-07 DIAGNOSIS — I701 Atherosclerosis of renal artery: Secondary | ICD-10-CM | POA: Diagnosis not present

## 2021-03-07 DIAGNOSIS — I10 Essential (primary) hypertension: Secondary | ICD-10-CM | POA: Diagnosis not present

## 2021-03-07 DIAGNOSIS — R7302 Impaired glucose tolerance (oral): Secondary | ICD-10-CM | POA: Diagnosis not present

## 2021-03-07 DIAGNOSIS — Z01818 Encounter for other preprocedural examination: Secondary | ICD-10-CM | POA: Insufficient documentation

## 2021-03-07 DIAGNOSIS — N3000 Acute cystitis without hematuria: Secondary | ICD-10-CM

## 2021-03-07 LAB — URINALYSIS, ROUTINE W REFLEX MICROSCOPIC
Bilirubin Urine: NEGATIVE
Hgb urine dipstick: NEGATIVE
Ketones, ur: NEGATIVE
Leukocytes,Ua: NEGATIVE
Nitrite: NEGATIVE
Specific Gravity, Urine: 1.015 (ref 1.000–1.030)
Total Protein, Urine: NEGATIVE
Urine Glucose: NEGATIVE
Urobilinogen, UA: 0.2 (ref 0.0–1.0)
pH: 6 (ref 5.0–8.0)

## 2021-03-07 NOTE — Patient Instructions (Signed)
You are cleared for the surgical procedure  Please continue all other medications as before, and refills have been done if requested.  Please have the pharmacy call with any other refills you may need.  Please keep your appointments with your specialists as you may have planned  Please go to the LAB at the blood drawing area for the tests to be done - just the urine testing today  You will be contacted by phone if any changes need to be made immediately.  Otherwise, you will receive a letter about your results with an explanation, but please check with MyChart first.  Please remember to sign up for MyChart if you have not done so, as this will be important to you in the future with finding out test results, communicating by private email, and scheduling acute appointments online when needed.

## 2021-03-07 NOTE — Progress Notes (Signed)
Patient ID: [name], male   DOB: 1936-02-22, 85 y.o.   MRN: [id number]        Chief Complaint: for spinal cord stimulator soon with L1 kyphoplasty, recent uti, htn, hyperglycemia       HPI:  [name] is a 85 y.o. male here soon to have procedure done for chronic pain; Pt denies chest pain, increased sob or doe, wheezing, orthopnea, PND, increased LE swelling, palpitations, dizziness or syncope.   Pt denies polydipsia, polyuria, or new focal neuro s/s.   Pt denies fever, wt loss, night sweats, loss of appetite, or other constitutional symptoms  Denies urinary symptoms such as dysuria, frequency, urgency, flank pain, hematuria or n/v, fever, chills, after recent antibx course for uti.  No other new complaints       Wt Readings from Last 3 Encounters:  03/07/21 214 lb 6.4 oz (97.3 kg)  10/24/20 213 lb (96.6 kg)  08/29/20 214 lb (97.1 kg)   BP Readings from Last 3 Encounters:  03/07/21 118/60  10/24/20 124/80  08/29/20 120/80         Past Medical History:  Diagnosis Date   Acute bronchitis per pt no fever--  cough since discharge from hospital 07-02-2015   per pcp note 07-05-2015  mild to moderate bronchitis vs pna   Allergic rhinitis    Bladder cancer (HCC)    BPH (benign prostatic hyperplasia)    Bradycardia    CAD (coronary artery disease)    Chronic low back pain    DDD (degenerative disc disease), lumbosacral    Depression    Diverticulosis of colon (without mention of hemorrhage)    Dyspnea    Essential tremor    Fatty liver disease, nonalcoholic    GERD (gastroesophageal reflux disease)    Glaucoma    History of bladder cancer    2004--  TCC low-grade , non-invasive   History of colon polyps    History of exercise stress test    02-05-2007-- ETT (Clinically positive, electrically equivocal, submaximal ETT,  appropriate bp response to exercise   History of hiatal hernia    History of kidney stones    History of peptic ulcer    HLD (hyperlipidemia)    HTN  (hypertension)    Nocturia    Productive cough    Rheumatoid arthritis(714.0)    Right renal artery stenosis ([place])    per cath 03-14-2009 --- 40-50%   RLS (restless legs syndrome)    Vertigo, intermittent    Weak urinary stream    Wears dentures    upper   Wears glasses    Wears hearing aid    bilateral   Wears partial dentures    lower   Past Surgical History:  Procedure Laterality Date   CARDIAC CATHETERIZATION  02-12-2007  dr gregg taylor   Non-obstructive CAD,  20% pLCX,  20% LAD, ef 65%   CARDIAC CATHETERIZATION  03-14-2009  dr Johnsie Cancel   pLAD and mid diaginal 20-30% multiple lesions,  OM1 20%, right renal ostial 40-50%,  ef 60%   CATARACT EXTRACTION W/ INTRAOCULAR LENS  IMPLANT, BILATERAL  2016   CYSTOSCOPY WITH BIOPSY N/A 07/11/2015   Procedure: CYSTOSCOPY WITH COLD CUP RESECTION AND FULGERATION;  Surgeon: Rana Snare, MD;  Location: [facility];  Service: Urology;  Laterality: N/A;   HIP ARTHROSCOPY W/ LABRAL DEBRIDEMENT Left 06-29-2000   and chondraplasty   LUMBAR DISC SURGERY  07-16-2005   LUMBAR LAMINECTOMY/DECOMPRESSION MICRODISCECTOMY  07/09/2011  Procedure: LUMBAR LAMINECTOMY/DECOMPRESSION MICRODISCECTOMY;  Surgeon: Johnn Hai;  Location: [facility];  Service: Orthopedics;  Laterality: Left;  Decompression L5 - S1 on the Left and repair of dura   LUMBAR LAMINECTOMY/DECOMPRESSION MICRODISCECTOMY N/A 07/27/2013   Procedure: DECOMPRESSION L4 - L5 WITH EXCISION OF SYNOVIAL CYST AND, LATERAL MASS FUSION 1 LEVEL;  Surgeon: Johnn Hai, MD;  Location: [facility];  Service: Orthopedics;  Laterality: N/A;   LUMBAR LAMINECTOMY/DECOMPRESSION MICRODISCECTOMY Left 07/06/2014   Procedure:  MICRO LUMBAR DECOMPRESSION L5-S1 ON LEFT, L4-5 REDO;  Surgeon: Johnn Hai, MD;  Location: [facility];  Service: Orthopedics;  Laterality: Left;   SPINAL CORD STIMULATOR INSERTION N/A 09/11/2016   Procedure: Spinal cord stimulator placement;  Surgeon: Melina Schools, MD;  Location:  [facility];  Service: Orthopedics;  Laterality: N/A;   TONSILLECTOMY AND ADENOIDECTOMY     TRANSTHORACIC ECHOCARDIOGRAM  09-20-2010   normal LVF, ef 55-65%/  mild MR, TR and PR/  mild LAE   TRANSURETHRAL RESECTION OF BLADDER TUMOR  09-21-2002    reports that he quit smoking about 38 years ago. His smoking use included cigarettes. He has a 40.00 pack-year smoking history. He has never used smokeless tobacco. He reports current alcohol use. He reports that he does not use drugs. family history includes Cancer in his brother, father, and sister; Diabetes in his sister; Heart attack in his mother. Allergies  Allergen Reactions   Morphine And Related Other (See Comments)    Migraine    Sulfa Antibiotics Rash and Itching    Patient  Is ok to use paper tape   Hydrochlorothiazide Other (See Comments)    REACTION: hyponatremia with daily use   Tape Itching    Patient  Is ok to use paper tape   Hytrin [Terazosin] Other (See Comments)    Dizziness   Sulfur Dioxide    Latex Rash   Lisinopril Cough   Current Outpatient Medications on File Prior to Visit  Medication Sig Dispense Refill   acetaminophen (TYLENOL) 500 MG tablet Take 1,000 mg by mouth every 6 (six) hours as needed for moderate pain.     albuterol (PROVENTIL) (2.5 MG/3ML) 0.083% nebulizer solution Take 3 mLs (2.5 mg total) by nebulization every 6 (six) hours as needed for wheezing or shortness of breath. 150 mL 1   albuterol (VENTOLIN HFA) 108 (90 Base) MCG/ACT inhaler Inhale 2 puffs into the lungs every 6 (six) hours as needed for wheezing or shortness of breath. 18 g 11   amLODipine (NORVASC) 10 MG tablet TAKE 1 TABLET EVERY DAY 90 tablet 1   ascorbic acid (VITAMIN C) 500 MG tablet Take 1 tablet (500 mg total) by mouth daily. 30 tablet 0   benzonatate (TESSALON) 100 MG capsule TAKE 1 CAPSULE(100 MG) BY MOUTH THREE TIMES DAILY AS NEEDED FOR COUGH 90 capsule 1   brimonidine (ALPHAGAN) 0.15 % ophthalmic solution Place 1 drop into both eyes 2  (two) times daily.     budesonide-formoterol (SYMBICORT) 160-4.5 MCG/ACT inhaler Inhale 2 puffs into the lungs 2 (two) times daily. 1 Inhaler 3   CANNABIDIOL PO TAKE  BY MOUTH AS DIRECTED BY YOUR PROVIDER     cetirizine (ZYRTEC) 10 MG tablet Take 10 mg by mouth daily.     cholecalciferol (VITAMIN D3) 25 MCG (1000 UNIT) tablet Take 1,000 Units by mouth daily.     citalopram (CELEXA) 20 MG tablet Take 1 tablet (20 mg total) by mouth daily. 90 tablet 2   clonazePAM (KLONOPIN) 0.5 MG tablet  TAKE 1 TABLET(0.5 MG) BY MOUTH TWICE DAILY AS NEEDED FOR ANXIETY 60 tablet 2   clotrimazole (LOTRIMIN) 1 % cream Apply 1 application topically 2 (two) times daily as needed (FOR RASH). Prescribed for rash in groin     dextromethorphan-guaiFENesin (MUCINEX DM) 30-600 MG 12hr tablet Take 1 tablet by mouth 2 (two) times daily.     fluticasone (FLONASE) 50 MCG/ACT nasal spray USE 2 SPRAYS IN EACH NOSTRIL DAILY 48 g 1   furosemide (LASIX) 20 MG tablet Take 1 tablet (20 mg total) by mouth 2 (two) times a week. ON TUESDAYS AND SATURDAYS 24 tablet 1   gabapentin (NEURONTIN) 300 MG capsule Take 3 capsules by mouth at bedtime.     hydrALAZINE (APRESOLINE) 10 MG tablet TAKE 1 TABLET THREE TIMES DAILY AS NEEDED FOR BLOOD PRESSURE MORE THAN 150/90 270 tablet 1   HYDROcodone-acetaminophen (NORCO/VICODIN) 5-325 MG tablet Take 1 tablet by mouth every 6 (six) hours as needed for severe pain. 10 tablet 0   hydroxypropyl methylcellulose / hypromellose (ISOPTO TEARS / GONIOVISC) 2.5 % ophthalmic solution Place 1 drop into both eyes 2 (two) times daily.     Insulin Pen Needle 29G X 10MM MISC Use as directed 200 each 0   LORazepam (ATIVAN) 1 MG tablet      losartan (COZAAR) 100 MG tablet Take 1 tablet (100 mg total) by mouth daily. 90 tablet 2   meclizine (ANTIVERT) 25 MG tablet TAKE 1 TABLET TWICE DAILY AS NEEDED FOR DIZZINESS 180 tablet 2   methocarbamol (ROBAXIN) 500 MG tablet Take 500 mg by mouth 3 (three) times daily as needed for  muscle spasms.     nitroGLYCERIN (NITROSTAT) 0.4 MG SL tablet PLACE 1 TABLET UNDER THE TONGUE EVERY 5 MINUTES AS NEEDED FOR CHEST PAIN. MAX 3 DOSES. CALL 911 IF NO IMPROVEMENT AFTER 1ST DOSE 25 tablet 0   Nutritional Supplements (JUICE PLUS FIBRE PO) Take 1 each 2 (two) times daily by mouth.     omeprazole (PRILOSEC) 40 MG capsule TAKE 1 CAPSULE TWICE DAILY 180 capsule 3   ondansetron (ZOFRAN) 4 MG tablet TAKE 1 TABLET EVERY 8 HOURS AS NEEDED FOR NAUSEA OR VOMITING 30 tablet 2   potassium chloride (KLOR-CON) 10 MEQ tablet Take one tablet daily for 1 week then take one tablet on on tuesdays and saturdays. 30 tablet 1   primidone (MYSOLINE) 250 MG tablet TAKE 1 TABLET (250 MG TOTAL) BY MOUTH 4 (FOUR) TIMES DAILY. 360 tablet 3   simvastatin (ZOCOR) 40 MG tablet TAKE 1 AND 1/2 TABLETS AT BEDTIME 135 tablet 1   tamsulosin (FLOMAX) 0.4 MG CAPS capsule Take 1 capsule (0.4 mg total) by mouth daily. 90 capsule 3   timolol (TIMOPTIC) 0.5 % ophthalmic solution Place 1 drop into both eyes 2 (two) times daily.   1   Travoprost, BAK Free, (TRAVATAN Z) 0.004 % SOLN ophthalmic solution Place 1 drop into both eyes at bedtime. 5 mL 5   triamcinolone (KENALOG) 0.025 % cream Apply 1 application topically 2 (two) times daily as needed (rash).      vitamin B-12 (CYANOCOBALAMIN) 500 MCG tablet Take 1 tablet by mouth daily.     [DISCONTINUED] insulin aspart (NOVOLOG) 100 UNIT/ML FlexPen Inject 4 Units into the skin 3 (three) times daily with meals. (Patient not taking: Reported on 01/19/2020) 6 mL 0   [DISCONTINUED] Insulin Detemir (LEVEMIR) 100 UNIT/ML Pen Inject 5 Units into the skin 2 (two) times daily. (Patient not taking: Reported on 01/19/2020) 6 mL 0   [  DISCONTINUED] ipratropium-albuterol (DUONEB) 0.5-2.5 (3) MG/3ML SOLN Take 3 mLs by nebulization every 6 (six) hours as needed. (Patient not taking: Reported on 01/19/2020) 360 mL 0   No current facility-administered medications on file prior to visit.        ROS:  All  others reviewed and negative.  Objective        PE:  BP 118/60 (BP Location: Right Arm, Patient Position: Sitting, Cuff Size: Large)   Pulse (!) 59   Temp 98.1 F (36.7 C) (Oral)   Ht '5\' 6"'$  (1.676 m)   Wt 214 lb 6.4 oz (97.3 kg)   SpO2 96%   BMI 34.61 kg/m                 Constitutional: Pt appears in NAD               HENT: Head: NCAT.                Right Ear: External ear normal.                 Left Ear: External ear normal.                Eyes: . Pupils are equal, round, and reactive to light. Conjunctivae and EOM are normal               Nose: without d/c or deformity               Neck: Neck supple. Gross normal ROM               Cardiovascular: Normal rate and regular rhythm.                 Pulmonary/Chest: Effort normal and breath sounds without rales or wheezing.                Abd:  Soft, NT, ND, + BS, no organomegaly               Neurological: Pt is alert. At baseline orientation, motor grossly intact               Skin: Skin is warm. No rashes, no other new lesions, LE edema - none               Psychiatric: Pt behavior is normal without agitation   Micro: none  Cardiac tracings I have personally interpreted today:  none  Pertinent Radiological findings (summarize): none   Lab Results  Component Value Date   WBC 7.9 10/24/2020   HGB 12.7 (L) 10/24/2020   HCT 39.0 10/24/2020   PLT 264.0 10/24/2020   GLUCOSE 76 10/24/2020   CHOL 137 10/24/2020   TRIG 131.0 10/24/2020   HDL 31.20 (L) 10/24/2020   LDLDIRECT 86.0 06/24/2019   LDLCALC 80 10/24/2020   ALT 16 10/24/2020   AST 15 10/24/2020   NA 138 10/24/2020   K 4.1 10/24/2020   CL 102 10/24/2020   CREATININE 1.03 10/24/2020   BUN 12 10/24/2020   CO2 29 10/24/2020   TSH 2.53 10/24/2020   PSA 0.53 11/12/2016   INR 1.15 02/23/2018   HGBA1C 6.1 10/24/2020   Assessment/Plan:  BANKS QU is a 85 y.o. White or Caucasian [1] male with  has a past medical history of Acute bronchitis (per pt no fever--   cough since discharge from hospital 07-02-2015), Allergic rhinitis, Bladder cancer ([place]), BPH (benign prostatic hyperplasia), Bradycardia, CAD (coronary artery disease), Chronic low back  pain, DDD (degenerative disc disease), lumbosacral, Depression, Diverticulosis of colon (without mention of hemorrhage), Dyspnea, Essential tremor, Fatty liver disease, nonalcoholic, GERD (gastroesophageal reflux disease), Glaucoma, History of bladder cancer, History of colon polyps, History of exercise stress test, History of hiatal hernia, History of kidney stones, History of peptic ulcer, HLD (hyperlipidemia), HTN (hypertension), Nocturia, Productive cough, Rheumatoid arthritis(714.0), Right renal artery stenosis (HCC), RLS (restless legs syndrome), Vertigo, intermittent, Weak urinary stream, Wears dentures, Wears glasses, Wears hearing aid, and Wears partial dentures.  UTI (urinary tract infection) S/p recent antibx, stable overall it seems but will check f/u urine studies given pending procedure  Preop exam for internal medicine O/w cleared from medicine standpoint for procedure  Impaired glucose tolerance Lab Results  Component Value Date   HGBA1C 6.1 10/24/2020   Stable, pt to continue current medical treatment  - diet   Essential hypertension BP Readings from Last 3 Encounters:  03/07/21 118/60  10/24/20 124/80  08/29/20 120/80   Stable, pt to continue medical treatment norvasc, hydralzine, losartan  Followup: Return in about 6 months (around 09/07/2021), or if symptoms worsen or fail to improve.  Cathlean Cower, MD 03/10/2021 4:06 PM [facility]

## 2021-03-08 ENCOUNTER — Encounter: Payer: Self-pay | Admitting: Internal Medicine

## 2021-03-08 LAB — URINE CULTURE: Result:: NO GROWTH

## 2021-03-10 ENCOUNTER — Encounter: Payer: Self-pay | Admitting: Internal Medicine

## 2021-03-10 NOTE — Assessment & Plan Note (Signed)
Lab Results  Component Value Date   HGBA1C 6.1 10/24/2020   Stable, pt to continue current medical treatment  - diet

## 2021-03-10 NOTE — Assessment & Plan Note (Signed)
S/p recent antibx, stable overall it seems but will check f/u urine studies given pending procedure

## 2021-03-10 NOTE — Assessment & Plan Note (Signed)
O/w cleared from medicine standpoint for procedure

## 2021-03-10 NOTE — Assessment & Plan Note (Signed)
BP Readings from Last 3 Encounters:  03/07/21 118/60  10/24/20 124/80  08/29/20 120/80   Stable, pt to continue medical treatment norvasc, hydralzine, losartan

## 2021-03-20 NOTE — Progress Notes (Signed)
Surgical Instructions    Your procedure is scheduled on 03/28/21.  Report to [facility] Main Entrance "A" at 08:30 A.M., then check in with the Admitting office.  Call this number if you have problems the morning of surgery:  [phone]   If you have any questions prior to your surgery date call [phone]: Open Monday-Friday 8am-4pm    Remember:  Do not eat after midnight the night before your surgery  You may drink clear liquids until 07:30am the morning of your surgery.   Clear liquids allowed are: Water, Non-Citrus Juices (without pulp), Carbonated Beverages, Clear Tea, Black Coffee ONLY (NO MILK, CREAM OR POWDERED CREAMER of any kind), and Gatorade    Take these medicines the morning of surgery with A SIP OF WATER  amLODipine (NORVASC) brimonidine (ALPHAGAN) eye drops cetirizine (ZYRTEC) citalopram (CELEXA)  dextromethorphan-guaiFENesin (MUCINEX DM)  fluticasone (FLONASE) hydrALAZINE (APRESOLINE)  hydroxypropyl methylcellulose / hypromellose (ISOPTO TEARS / GONIOVISC) meclizine (ANTIVERT) omeprazole (PRILOSEC)  primidone (MYSOLINE) tamsulosin (FLOMAX) timolol (TIMOPTIC) eye drops Tiotropium Bromide Monohydrate (SPIRIVA RESPIMAT)  If needed:  acetaminophen (TYLENOL)  albuterol (PROVENTIL) (2.5 MG/3ML) nebulizer and inhaler if needed benzonatate (TESSALON) clonazePAM (KLONOPIN)  HYDROcodone-acetaminophen (NORCO/VICODIN) LORazepam (ATIVAN) methocarbamol (ROBAXIN)  ondansetron (ZOFRAN)  As of today, STOP taking any Aspirin (unless otherwise instructed by your surgeon) Aleve, Naproxen, Ibuprofen, Motrin, Advil, Goody's, BC's, all herbal medications, fish oil, and all vitamins.          Do not wear jewelry or makeup Do not wear lotions, powders, perfumes/colognes, or deodorant. Men may shave face and neck. Do not bring valuables to the hospital.  DO Not wear nail polish, gel polish, artificial nails, or any other type of covering on natural nails including finger  and toenails. If patients have artificial nails, gel coating, etc. that need to be removed by a nail salon please have this removed prior to surgery or surgery may need to be canceled/delayed if the surgeon/ anesthesia feels like the patient is unable to be adequately monitored.             [facility] is not responsible for any belongings or valuables.  Do NOT Smoke (Tobacco/Vaping)  24 hours prior to your procedure If you use a CPAP at night, you may bring all equipment for your overnight stay.   Contacts, glasses, dentures or bridgework may not be worn into surgery, please bring cases for these belongings   For patients admitted to the hospital, discharge time will be determined by your treatment team.   Patients discharged the day of surgery will not be allowed to drive home, and someone needs to stay with them for 24 hours.  ONLY 1 SUPPORT PERSON MAY BE PRESENT WHILE YOU ARE IN SURGERY. IF YOU ARE TO BE ADMITTED ONCE YOU ARE IN YOUR ROOM YOU WILL BE ALLOWED TWO (2) VISITORS.  Minor children may have two parents present. Special consideration for safety and communication needs will be reviewed on a case by case basis.  Special instructions:    Oral Hygiene is also important to reduce your risk of infection.  Remember - BRUSH YOUR TEETH THE MORNING OF SURGERY WITH YOUR REGULAR TOOTHPASTE   - Preparing For Surgery  Before surgery, you can play an important role. Because skin is not sterile, your skin needs to be as free of germs as possible. You can reduce the number of germs on your skin by washing with CHG (chlorahexidine gluconate) Soap before surgery.  CHG is an antiseptic cleaner which kills  germs and bonds with the skin to continue killing germs even after washing.     Please do not use if you have an allergy to CHG or antibacterial soaps. If your skin becomes reddened/irritated stop using the CHG.  Do not shave (including legs and underarms) for at least 48 hours prior  to first CHG shower. It is OK to shave your face.  Please follow these instructions carefully.     Shower the NIGHT BEFORE SURGERY and the MORNING OF SURGERY with CHG Soap.   If you chose to wash your hair, wash your hair first as usual with your normal shampoo. After you shampoo, rinse your hair and body thoroughly to remove the shampoo.  Then [employer] and genitals (private parts) with your normal soap and rinse thoroughly to remove soap.  After that Use CHG Soap as you would any other liquid soap. You can apply CHG directly to the skin and wash gently with a scrungie or a clean washcloth.   Apply the CHG Soap to your body ONLY FROM THE NECK DOWN.  Do not use on open wounds or open sores. Avoid contact with your eyes, ears, mouth and genitals (private parts). Wash Face and genitals (private parts)  with your normal soap.   Wash thoroughly, paying special attention to the area where your surgery will be performed.  Thoroughly rinse your body with warm water from the neck down.  DO NOT shower/wash with your normal soap after using and rinsing off the CHG Soap.  Pat yourself dry with a CLEAN TOWEL.  Wear CLEAN PAJAMAS to bed the night before surgery  Place CLEAN SHEETS on your bed the night before your surgery  DO NOT SLEEP WITH PETS.   Day of Surgery: Take a shower with CHG soap. Wear Clean/Comfortable clothing the morning of surgery Do not apply any deodorants/lotions.   Remember to brush your teeth WITH YOUR REGULAR TOOTHPASTE.   Please read over the following fact sheets that you were given.

## 2021-03-21 ENCOUNTER — Encounter (HOSPITAL_COMMUNITY)
Admission: RE | Admit: 2021-03-21 | Discharge: 2021-03-21 | Disposition: A | Discharge status: Home / Self Care | Payer: Medicare Other | Source: Ambulatory Visit | Attending: Orthopedic Surgery | Admitting: Orthopedic Surgery

## 2021-03-21 ENCOUNTER — Encounter (HOSPITAL_COMMUNITY): Payer: Self-pay

## 2021-03-21 ENCOUNTER — Other Ambulatory Visit: Payer: Self-pay

## 2021-03-21 DIAGNOSIS — Z01818 Encounter for other preprocedural examination: Secondary | ICD-10-CM | POA: Diagnosis not present

## 2021-03-21 HISTORY — DX: Chronic obstructive pulmonary disease, unspecified: J44.9

## 2021-03-21 HISTORY — DX: Cerebral infarction, unspecified: I63.9

## 2021-03-21 HISTORY — DX: Pneumonia, unspecified organism: J18.9

## 2021-03-21 LAB — CBC
HCT: 45.2 % (ref 39.0–52.0)
Hemoglobin: 14.7 g/dL (ref 13.0–17.0)
MCH: 28.5 pg (ref 26.0–34.0)
MCHC: 32.5 g/dL (ref 30.0–36.0)
MCV: 87.6 fL (ref 80.0–100.0)
Platelets: 220 10*3/uL (ref 150–400)
RBC: 5.16 MIL/uL (ref 4.22–5.81)
RDW: 14 % (ref 11.5–15.5)
WBC: 7.8 10*3/uL (ref 4.0–10.5)
nRBC: 0 % (ref 0.0–0.2)

## 2021-03-21 LAB — SURGICAL PCR SCREEN
MRSA, PCR: NEGATIVE
Staphylococcus aureus: NEGATIVE

## 2021-03-21 LAB — BASIC METABOLIC PANEL
Anion gap: 7 (ref 5–15)
BUN: 10 mg/dL (ref 8–23)
CO2: 27 mmol/L (ref 22–32)
Calcium: 9.3 mg/dL (ref 8.9–10.3)
Chloride: 101 mmol/L (ref 98–111)
Creatinine, Ser: 0.99 mg/dL (ref 0.61–1.24)
GFR, Estimated: >60 mL/min (ref >60)
Glucose, Bld: 81 mg/dL (ref 70–99)
Potassium: 3.6 mmol/L (ref 3.5–5.1)
Sodium: 135 mmol/L (ref 135–145)

## 2021-03-21 NOTE — Progress Notes (Signed)
PCP - Cathlean Cower Cardiologist - Berniece Salines Pulmonology: June Leap  PPM/ICD - denies   Chest x-ray - n/a EKG - 03/21/21 Stress Test - 11/15/19 ECHO - 11/17/19 Cardiac Cath - denies  Sleep Study - denies   No diabetes. Only checked his CBG after having covid and was on prednisone. CBG returned to normal after he stopped taking prednisone.   As of today, STOP taking any Aspirin (unless otherwise instructed by your surgeon) Aleve, Naproxen, Ibuprofen, Motrin, Advil, Goody's, BC's, all herbal medications, fish oil, and all vitamins.  ERAS Protcol -yes PRE-SURGERY Ensure or G2- no  COVID TEST- ambulatory surgery, not needed    Anesthesia review: yes, cardiac history   Patient denies shortness of breath, fever, cough and chest pain at PAT appointment   All instructions explained to the patient, with a verbal understanding of the material. Patient agrees to go over the instructions while at home for a better understanding. Patient also instructed to self quarantine after being tested for COVID-19. The opportunity to ask questions was provided.

## 2021-03-22 ENCOUNTER — Ambulatory Visit (INDEPENDENT_AMBULATORY_CARE_PROVIDER_SITE_OTHER): Payer: Medicare Other | Admitting: *Deleted

## 2021-03-22 ENCOUNTER — Ambulatory Visit: Payer: Self-pay | Admitting: Orthopedic Surgery

## 2021-03-22 DIAGNOSIS — I509 Heart failure, unspecified: Secondary | ICD-10-CM

## 2021-03-22 DIAGNOSIS — I10 Essential (primary) hypertension: Secondary | ICD-10-CM

## 2021-03-22 DIAGNOSIS — G894 Chronic pain syndrome: Secondary | ICD-10-CM

## 2021-03-22 NOTE — Patient Instructions (Signed)
Visit Information   [name], it was nice talking with you today about [name]'s health care needs   I look forward to talking to you again for an update on Thursday April 11, 2021 at 3:30 pm- please be listening out for my call that day.  I will call as close to 3:30 pm as possible.   If you need to cancel or re-schedule our telephone visit, please call [phone] and one of our care guides will be happy to assist you.   I look forward to hearing about your progress.   Please don't hesitate to contact me if I can be of assistance to you before our next scheduled telephone appointment.   Oneta Rack, RN, BSN, [facility] RN Care Coordination- [place] [phone]: direct office [phone]: mobile   PATIENT GOALS:   Goals Addressed             This Visit's Progress    Patient Self- Care Activities   On track    Timeframe:  Long-Range Goal Priority:  Medium Start Date:        03/22/21                     Expected End Date:      03/22/22                 Patient will attend all scheduled provider appointments Patient will call pharmacy for medication refills Patient will call provider office for new concerns or questions Patient will continue to take medications from pill box after they have been prepared by caregiver Patient will continue to follow heart healthy, low salt diet Patient will continue to monitor and write down on paper daily blood pressures at home       Low-Sodium Eating Plan Sodium, which is an element that makes up salt, helps you maintain a healthy balance of fluids in your body. Too much sodium can increase your blood pressure and cause fluid and waste to be held in your body. Your health care provider or dietitian may recommend following this plan if you have high blood pressure (hypertension), kidney disease, liver disease, or heart failure. Eating less sodium can help lower your blood pressure, reduce swelling, and  protect your heart, liver, and kidneys. What are tips for following this plan? Reading food labels The Nutrition Facts label lists the amount of sodium in one serving of the food. If you eat more than one serving, you must multiply the listed amount of sodium by the number of servings. Choose foods with less than 140 mg of sodium per serving. Avoid foods with 300 mg of sodium or more per serving. Shopping  Look for lower-sodium products, often labeled as "low-sodium" or "no salt added." Always check the sodium content, even if foods are labeled as "unsalted" or "no salt added." Buy fresh foods. Avoid canned foods and pre-made or frozen meals. Avoid canned, cured, or processed meats. Buy breads that have less than 80 mg of sodium per slice. Cooking  Eat more home-cooked food and less restaurant, buffet, and fast food. Avoid adding salt when cooking. Use salt-free seasonings or herbs instead of table salt or sea salt. Check with your health care provider or pharmacist before using salt substitutes. Cook with plant-based oils, such as canola, sunflower, or olive oil. Meal planning When eating at a restaurant, ask that your food be prepared with less salt or no salt, if possible. Avoid dishes labeled  as brined, pickled, cured, smoked, or made with soy sauce, miso, or teriyaki sauce. Avoid foods that contain MSG (monosodium glutamate). MSG is sometimes added to [place] food, bouillon, and some canned foods. Make meals that can be grilled, baked, poached, roasted, or steamed. These are generally made with less sodium. General information Most people on this plan should limit their sodium intake to 1,500-2,000 mg (milligrams) of sodium each day. What foods should I eat? Fruits Fresh, frozen, or canned fruit. Fruit juice. Vegetables Fresh or frozen vegetables. "No salt added" canned vegetables. "No salt added" tomato sauce and paste. Low-sodium or reduced-sodium tomato and vegetable  juice. Grains Low-sodium cereals, including oats, puffed wheat and rice, and shredded wheat. Low-sodium crackers. Unsalted rice. Unsalted pasta. Low-sodium bread. Whole-grain breads and whole-grain pasta. Meats and other proteins Fresh or frozen (no salt added) meat, poultry, seafood, and fish. Low-sodium canned tuna and salmon. Unsalted nuts. Dried peas, beans, and lentils without added salt. Unsalted canned beans. Eggs. Unsalted nut butters. Dairy Milk. Soy milk. Cheese that is naturally low in sodium, such as ricotta cheese, fresh mozzarella, or Swiss cheese. Low-sodium or reduced-sodium cheese. Cream cheese. Yogurt. Seasonings and condiments Fresh and dried herbs and spices. Salt-free seasonings. Low-sodium mustard and ketchup. Sodium-free salad dressing. Sodium-free light mayonnaise. Fresh or refrigerated horseradish. Lemon juice. Vinegar. Other foods Homemade, reduced-sodium, or low-sodium soups. Unsalted popcorn and pretzels. Low-salt or salt-free chips. The items listed above may not be a complete list of foods and beverages you can eat. Contact a dietitian for more information. What foods should I avoid? Vegetables Sauerkraut, pickled vegetables, and relishes. Olives. [place] fries. Onion rings. Regular canned vegetables (not low-sodium or reduced-sodium). Regular canned tomato sauce and paste (not low-sodium or reduced-sodium). Regular tomato and vegetable juice (not low-sodium or reduced-sodium). Frozen vegetables in sauces. Grains Instant hot cereals. Bread stuffing, pancake, and biscuit mixes. Croutons. Seasoned rice or pasta mixes. Noodle soup cups. Boxed or frozen macaroni and cheese. Regular salted crackers. Self-rising flour. Meats and other proteins Meat or fish that is salted, canned, smoked, spiced, or pickled. Precooked or cured meat, such as sausages or meat loaves. Berniece Salines. Ham. Pepperoni. Hot dogs. Corned beef. Chipped beef. Salt pork. Jerky. Pickled herring. Anchovies and  sardines. Regular canned tuna. Salted nuts. Dairy Processed cheese and cheese spreads. Hard cheeses. Cheese curds. Blue cheese. Feta cheese. String cheese. Regular cottage cheese. Buttermilk. Canned milk. Fats and oils Salted butter. Regular margarine. Ghee. Bacon fat. Seasonings and condiments Onion salt, garlic salt, seasoned salt, table salt, and sea salt. Canned and packaged gravies. Worcestershire sauce. Tartar sauce. Barbecue sauce. Teriyaki sauce. Soy sauce, including reduced-sodium. Steak sauce. Fish sauce. Oyster sauce. Cocktail sauce. Horseradish that you find on the shelf. Regular ketchup and mustard. Meat flavorings and tenderizers. Bouillon cubes. Hot sauce. Pre-made or packaged marinades. Pre-made or packaged taco seasonings. Relishes. Regular salad dressings. Salsa. Other foods Salted popcorn and pretzels. Corn chips and puffs. Potato and tortilla chips. Canned or dried soups. Pizza. Frozen entrees and pot pies. The items listed above may not be a complete list of foods and beverages you should avoid. Contact a dietitian for more information. Summary Eating less sodium can help lower your blood pressure, reduce swelling, and protect your heart, liver, and kidneys. Most people on this plan should limit their sodium intake to 1,500-2,000 mg (milligrams) of sodium each day. Canned, boxed, and frozen foods are high in sodium. Restaurant foods, fast foods, and pizza are also very high in sodium. You also get sodium  by adding salt to food. Try to cook at home, eat more fresh fruits and vegetables, and eat less fast food and canned, processed, or prepared foods. This information is not intended to replace advice given to you by your health care provider. Make sure you discuss any questions you have with your health care provider. Document Revised: 08/05/2019 Document Reviewed: 06/01/2019 Elsevier Patient Education  2022 [employer].   Consent to CCM Services: Mr. Millea was given  information about Chronic Care Management services including:  CCM service includes personalized support from designated clinical staff supervised by his physician, including individualized plan of care and coordination with other care providers 24/7 contact phone numbers for assistance for urgent and routine care needs. Service will only be billed when office clinical staff spend 20 minutes or more in a month to coordinate care. Only one practitioner may furnish and bill the service in a calendar month. The patient may stop CCM services at any time (effective at the end of the month) by phone call to the office staff. The patient will be responsible for cost sharing (co-pay) of up to 20% of the service fee (after annual deductible is met).  Patient agreed to services and verbal consent obtained.   Patient's caregiver verbalizes understanding of instructions provided today and agrees to view in [place]  Telephone follow up appointment with care management team member scheduled for:  Thursday April 11, 2021 at 3:30 pm The patient has been provided with contact information for the care management team and has been advised to call with any health related questions or concerns  Oneta Rack, RN, BSN, [place] [phone]: direct office [phone]: mobile    CLINICAL CARE PLAN: Patient Care Plan: RN Care Manager Plan of Care     Problem Identified: Chronic Disease Management Needs   Priority: Medium     Long-Range Goal: Development of plan of care for long term chronic disease management   Start Date: 03/22/2021  Expected End Date: 03/22/2022  Priority: Medium  Note:   Current Barriers:  Chronic Disease Management support and education needs related to HTN and chronic pain  RNCM Clinical Goal(s):  Patient will demonstrate ongoing health management independence HTN; chronic pain  through collaboration with RN Care  manager, provider, and care team.   Interventions: 1:1 collaboration with primary care provider regarding development and update of comprehensive plan of care as evidenced by provider attestation and co-signature Inter-disciplinary care team collaboration (see longitudinal plan of care) Evaluation of current treatment plan related to  self management and patient's adherence to plan as established by provider  Hypertension: (Status: New goal.) Last practice recorded BP readings:  BP Readings from Last 3 Encounters:  03/21/21 139/67  03/07/21 118/60  10/24/20 124/80  Most recent eGFR/CrCl: No results found for: EGFR  No components found for: CRCL  Evaluation of current treatment plan related to hypertension self management and patient's adherence to plan as established by provider;   Reviewed prescribed diet low sodium/ salt, heart healthy Discussed plans with patient for ongoing care management follow up and provided patient with direct contact information for care management team; Advised patient, providing education and rationale, to monitor blood pressure daily and record, calling PCP for findings outside established parameters;  Reviewed scheduled/upcoming provider appointments including:  Discussed complications of poorly controlled blood pressure such as heart disease, stroke, circulatory complications, vision complications, kidney impairment, sexual dysfunction;  Assessed social determinant of health  barriers;  Reviewed recent blood pressures at home with caregiver: reports consistent blood pressures between 120-130/ 80-90; confirms patient able to self-monitor blood pressures at home Confirmed that caregiver has no medication concerns: she prepares medications in pill box, patient then takes independently Does not currently complete daily weights at home; reports no lower extremity swelling; provided verbal education around purpose of daily weights in setting of concurrent CHF-  caregiver reports fluid status is well controlled with diuretic twice a week; encouraged her to consider possibly beginning monitoring weights at home twice a week Upcoming provider appointments: 05/01/21- cardiology provider; 05/13/21- podiatry: confirmed caregiver will provide transportation  Pain:  (Status: New goal.) Pain assessment performed- caregiver reports patient in constant but well-controlled pain with current interventions/ medications Medications reviewed Reviewed provider established plan for pain management; Discussed importance of adherence to all scheduled medical appointments; Counseled on the importance of reporting any/all new or changed pain symptoms or management strategies to pain management provider; Reviewed with patient prescribed pharmacological and nonpharmacological pain relief strategies; Confirmed patient is scheduled for replacement of battery in nerve stimulator 03/28/21- caregivers will provide transportation; expected to be outpatient procedure; has had nerve stimulator for a long time; just needs battery replaced; confirmed patient avoids use of narcotic pain medication and that he is actively followed by pain management clinic and orthopedic providers  Patient Goals/Self-Care Activities: Patient will attend all scheduled provider appointments Patient will call pharmacy for medication refills Patient will call provider office for new concerns or questions Patient will continue to take medications from pill box after they have been prepared by caregiver Patient will continue to follow heart healthy, low salt diet Patient will continue to monitor and write down on paper daily blood pressures at home

## 2021-03-22 NOTE — Progress Notes (Signed)
Anesthesia Chart Review:   Case: [id number] Date/Time: 03/28/21 1022   Procedures:      SPINAL CORD STIMULATOR BATTERY EXCHANGE/REPLACEMENT, L1 KYPHOPLASTY - 78 MINS LOCAL WITH IV REG     KYPHOPLASTY   Anesthesia type: Regional   Pre-op diagnosis: Chronic compression fracture L1, failed spinal cord stimulator battery   Location: [facility] OR ROOM 04 / [place] [facility]   Surgeons: Melina Schools, MD       DISCUSSION: - Pt is 85 years old with hx CAD (mild nonobstructive by 2010 cath), CHF, HTN, TIA, COPD, postinflammatory pulmonary fibrosis, nonalcoholic fatty liver disease  Dr. Rolena Infante' office placed lab orders the day after pt's PAT appointment. Will obtain PT/PTT and UA day of surgery.    VS: BP 139/67   Pulse (!) 56   Temp 36.6 C (Oral)   Resp 18   Ht '5\' 6"'$  (1.676 m)   Wt 90.7 kg   SpO2 97%   BMI 32.28 kg/m   PROVIDERS: - PCP is Biagio Borg, MD who cleared pt for surgery at last office visit 03/07/21 - Cardiologist is [employer], DO. Last office visit 08/29/20 - Pulmonologist is June Leap, MD. Last office visit 04/27/20   LABS: Labs reviewed: Acceptable for surgery. (all labs ordered are listed, but only abnormal results are displayed)  Labs Reviewed  SURGICAL PCR SCREEN  CBC  BASIC METABOLIC PANEL     IMAGES: CXR 10/24/20: No active cardiopulmonary disease   EKG 03/21/21: Sinus bradycardia   CV: Echo 11/17/19:  1. Left ventricular ejection fraction, by estimation, is 60 to 65%. The left ventricle has normal function. The left ventricle has no regional wall motion abnormalities. Left ventricular diastolic parameters are consistent with Grade I diastolic  dysfunction (impaired relaxation).   2. Right ventricular systolic function is normal. The right ventricular size is normal. There is normal pulmonary artery systolic pressure.   3. Left atrial size was mild to moderately dilated.   4. The mitral valve is normal in structure. No evidence of mitral valve regurgitation. No  evidence of mitral stenosis.   5. The aortic valve is normal in structure. Aortic valve regurgitation is mild. Mild aortic valve sclerosis is present, with no evidence of aortic valve stenosis.   6. There is mild dilatation of the ascending aorta measuring 40 mm.   7. The inferior vena cava is normal in size with greater than 50% respiratory variability, suggesting right atrial pressure of 3 mmHg.    Nuclear stress test 11/15/19:  Nuclear stress EF: 61%. There was no ST segment deviation noted during stress. The study is normal. This is a low risk study. The left ventricular ejection fraction is normal (55-65%). Normal stress nuclear study with no ischemia or infarction.  Gated ejection fraction 61% with normal wall motion.  Cardiac cath 03/14/09 - LM normal - LAD multiple mid and distal 20-30% lesions. D1 multiple 20-30% lesions. - CX normal. OM1 and OM2  small vessels with 20-30% multiple lesions. Two posterolateral branches with 20-30% multiple lesions.  - RCA normal  Past Medical History:  Diagnosis Date   Acute bronchitis per pt no fever--  cough since discharge from hospital 07-02-2015   per pcp note 07-05-2015  mild to moderate bronchitis vs pna   Allergic rhinitis    Bladder cancer (HCC)    BPH (benign prostatic hyperplasia)    Bradycardia    CAD (coronary artery disease)    CHF (congestive heart failure) (HCC)    Chronic low back pain  COPD (chronic obstructive pulmonary disease) (HCC)    DDD (degenerative disc disease), lumbosacral    Depression    Diverticulosis of colon (without mention of hemorrhage)    Dyspnea    Essential tremor    Fatty liver disease, nonalcoholic    GERD (gastroesophageal reflux disease)    Glaucoma    History of bladder cancer    2004--  TCC low-grade , non-invasive   History of colon polyps    History of exercise stress test    02-05-2007-- ETT (Clinically positive, electrically equivocal, submaximal ETT,  appropriate bp response to  exercise   History of hiatal hernia    History of kidney stones    History of peptic ulcer    HLD (hyperlipidemia)    HTN (hypertension)    Nocturia    Pneumonia    Productive cough    Rheumatoid arthritis(714.0)    Right renal artery stenosis ([place])    per cath 03-14-2009 --- 40-50%   RLS (restless legs syndrome)    Stroke (HCC)    TIA   Vertigo, intermittent    Weak urinary stream    Wears dentures    upper   Wears glasses    Wears hearing aid    bilateral   Wears partial dentures    lower    Past Surgical History:  Procedure Laterality Date   CARDIAC CATHETERIZATION  02-12-2007  dr gregg taylor   Non-obstructive CAD,  20% pLCX,  20% LAD, ef 65%   CARDIAC CATHETERIZATION  03-14-2009  dr Johnsie Cancel   pLAD and mid diaginal 20-30% multiple lesions,  OM1 20%, right renal ostial 40-50%,  ef 60%   CATARACT EXTRACTION W/ INTRAOCULAR LENS  IMPLANT, BILATERAL  07/14/2014   CYSTOSCOPY WITH BIOPSY N/A 07/11/2015   Procedure: CYSTOSCOPY WITH COLD CUP RESECTION AND FULGERATION;  Surgeon: Rana Snare, MD;  Location: [facility];  Service: Urology;  Laterality: N/A;   HIP ARTHROSCOPY W/ LABRAL DEBRIDEMENT Left 06/29/2000   and chondraplasty   JOINT REPLACEMENT     right big toe replacement   LUMBAR DISC SURGERY  07/16/2005   LUMBAR LAMINECTOMY/DECOMPRESSION MICRODISCECTOMY  07/09/2011   Procedure: LUMBAR LAMINECTOMY/DECOMPRESSION MICRODISCECTOMY;  Surgeon: Johnn Hai;  Location: [facility];  Service: Orthopedics;  Laterality: Left;  Decompression L5 - S1 on the Left and repair of dura   LUMBAR LAMINECTOMY/DECOMPRESSION MICRODISCECTOMY N/A 07/27/2013   Procedure: DECOMPRESSION L4 - L5 WITH EXCISION OF SYNOVIAL CYST AND, LATERAL MASS FUSION 1 LEVEL;  Surgeon: Johnn Hai, MD;  Location: [facility];  Service: Orthopedics;  Laterality: N/A;   LUMBAR LAMINECTOMY/DECOMPRESSION MICRODISCECTOMY Left 07/06/2014   Procedure:  MICRO LUMBAR DECOMPRESSION L5-S1 ON LEFT, L4-5 REDO;   Surgeon: Johnn Hai, MD;  Location: [facility];  Service: Orthopedics;  Laterality: Left;   SPINAL CORD STIMULATOR INSERTION N/A 09/11/2016   Procedure: Spinal cord stimulator placement;  Surgeon: Melina Schools, MD;  Location: [facility];  Service: Orthopedics;  Laterality: N/A;   TONSILLECTOMY AND ADENOIDECTOMY     TRANSTHORACIC ECHOCARDIOGRAM  09/20/2010   normal LVF, ef 55-65%/  mild MR, TR and PR/  mild LAE   TRANSURETHRAL RESECTION OF BLADDER TUMOR  09/21/2002    MEDICATIONS:  acetaminophen (TYLENOL) 650 MG CR tablet   albuterol (PROVENTIL) (2.5 MG/3ML) 0.083% nebulizer solution   albuterol (VENTOLIN HFA) 108 (90 Base) MCG/ACT inhaler   amLODipine (NORVASC) 10 MG tablet   ascorbic acid (VITAMIN C) 500 MG tablet   aspirin EC 81 MG tablet  benzonatate (TESSALON) 100 MG capsule   brimonidine (ALPHAGAN) 0.15 % ophthalmic solution   budesonide-formoterol (SYMBICORT) 160-4.5 MCG/ACT inhaler   CANNABIDIOL PO   cetirizine (ZYRTEC) 10 MG tablet   Cholecalciferol (VITAMIN D) 125 MCG (5000 UT) CAPS   citalopram (CELEXA) 20 MG tablet   clonazePAM (KLONOPIN) 0.5 MG tablet   clotrimazole (LOTRIMIN) 1 % cream   dextromethorphan-guaiFENesin (MUCINEX DM) 30-600 MG 12hr tablet   fluticasone (FLONASE) 50 MCG/ACT nasal spray   furosemide (LASIX) 20 MG tablet   gabapentin (NEURONTIN) 300 MG capsule   hydrALAZINE (APRESOLINE) 10 MG tablet   HYDROcodone-acetaminophen (NORCO/VICODIN) 5-325 MG tablet   hydroxypropyl methylcellulose / hypromellose (ISOPTO TEARS / GONIOVISC) 2.5 % ophthalmic solution   Insulin Pen Needle 29G X 10MM MISC   lidocaine (LIDODERM) 5 %   LORazepam (ATIVAN) 1 MG tablet   losartan (COZAAR) 100 MG tablet   meclizine (ANTIVERT) 25 MG tablet   methocarbamol (ROBAXIN) 500 MG tablet   nitroGLYCERIN (NITROSTAT) 0.4 MG SL tablet   omeprazole (PRILOSEC) 40 MG capsule   ondansetron (ZOFRAN) 4 MG tablet   potassium chloride (KLOR-CON) 10 MEQ tablet   primidone (MYSOLINE) 250 MG  tablet   simvastatin (ZOCOR) 40 MG tablet   tamsulosin (FLOMAX) 0.4 MG CAPS capsule   timolol (TIMOPTIC) 0.5 % ophthalmic solution   Tiotropium Bromide Monohydrate (SPIRIVA RESPIMAT) 2.5 MCG/ACT AERS   Travoprost, BAK Free, (TRAVATAN Z) 0.004 % SOLN ophthalmic solution   triamcinolone (KENALOG) 0.025 % cream   vitamin B-12 (CYANOCOBALAMIN) 500 MCG tablet   No current facility-administered medications for this encounter.    If no changes, I anticipate pt can proceed with surgery as scheduled.   Willeen Cass, PhD, FNP-BC [facility]/Anesthesiology Phone: [phone] 03/22/2021 1:35 PM

## 2021-03-22 NOTE — Anesthesia Preprocedure Evaluation (Addendum)
Anesthesia Evaluation    Airway Mallampati: II       Dental  (+) Upper Dentures, Lower Dentures   Pulmonary COPD,  COPD inhaler, former smoker,    Pulmonary exam normal        Cardiovascular hypertension, Pt. on medications + CAD  Normal cardiovascular exam     Neuro/Psych  Headaches, PSYCHIATRIC DISORDERS Anxiety Depression    GI/Hepatic GERD  Medicated and Controlled,  Endo/Other    Renal/GU      Musculoskeletal  (+) Arthritis , Rheumatoid disorders,    Abdominal (+) + obese,   Peds  Hematology   Anesthesia Other Findings  Berniece Salines, DO  11/15/2019 3:19 PM EDT   Normal stress test please notify patient. Please forward copy to PCP.   IMPRESSIONS    1. Left ventricular ejection fraction, by estimation, is 60 to 65%. The  left ventricle has normal function. The left ventricle has no regional  wall motion abnormalities. Left ventricular diastolic parameters are  consistent with Grade I diastolic  dysfunction (impaired relaxation).  2. Right ventricular systolic function is normal. The right ventricular  size is normal. There is normal pulmonary artery systolic pressure.  3. Left atrial size was mild to moderately dilated.  4. The mitral valve is normal in structure. No evidence of mitral valve  regurgitation. No evidence of mitral stenosis.  5. The aortic valve is normal in structure. Aortic valve regurgitation is  mild. Mild aortic valve sclerosis is present, with no evidence of aortic  valve stenosis.  6. There is mild dilatation of the ascending aorta measuring 40 mm.  7. The inferior vena cava is normal in size with greater than 50%  respiratory variability, suggesting right atrial pressure of 3 mmHg.     Reproductive/Obstetrics                          Anesthesia Physical Anesthesia Plan  ASA: 3  Anesthesia Plan: General   Post-op Pain Management:    Induction:  Intravenous  PONV Risk Score and Plan: 3 and Ondansetron  Airway Management Planned: Oral ETT  Additional Equipment: None  Intra-op Plan:   Post-operative Plan: Extubation in OR  Informed Consent: I have reviewed the patients History and Physical, chart, labs and discussed the procedure including the risks, benefits and alternatives for the proposed anesthesia with the patient or authorized representative who has indicated his/her understanding and acceptance.     Dental advisory given  Plan Discussed with:   Anesthesia Plan Comments: (See APP note by Durel Salts, FNP )       Anesthesia Quick Evaluation

## 2021-03-22 NOTE — H&P (Signed)
Subjective:   Kypho L1/ SCS battery replacement with local anasthesia & IV sedation for SCS battery at end of life & L1 compression fracture [place] 03/28/21  Patient Active Problem List   Diagnosis Date Noted   Preop exam for internal medicine 03/07/2021   Other abnormalities of gait and mobility 02/04/2021   Cataract 02/04/2021   Wears partial dentures    Wears glasses    Wears hearing aid    Wears dentures    Weak urinary stream    Vertigo, intermittent    RLS (restless legs syndrome)    Productive cough    Nocturia    History of peptic ulcer    History of kidney stones    History of hiatal hernia    History of exercise stress test    History of colon polyps    History of bladder cancer    Fatty liver disease, nonalcoholic    Glaucoma    DDD (degenerative disc disease), lumbosacral    Chronic low back pain    BPH (benign prostatic hyperplasia)    Acute bronchitis    Aortic atherosclerosis ([place]) 04/25/2020   AKI (acute kidney injury) ([place]) 01/23/2020   Chest pain of uncertain etiology [id number]   Essential hypertension 11/12/2019   Dizziness 11/12/2019   Obesity (BMI 30-39.9) 11/12/2019   History of COVID-19 10/03/2019   Acute respiratory failure with hypoxia ([place]) 10/03/2019   Pneumonia due to COVID-19 virus 08/16/2019   Acute respiratory failure due to COVID-19 ([place]) 08/16/2019   COVID-19 virus infection 08/09/2019   Memory loss 06/24/2019   Wheezing 06/24/2019   Abrasion of left arm 04/11/2019   Joint swelling of lower leg 11/29/2018   Abnormal urinalysis 02/25/2018   Anxiety 01/08/2018   Laceration of hand 12/18/2017   Pain of left hand 12/18/2017   Laceration of scalp without foreign body 06/09/2017   Nonintractable headache 06/09/2017   Acute sinus infection 04/14/2017   Nausea and vomiting 03/27/2017   Hyponatremia 03/17/2017   Right lumbar radiculopathy 01/21/2017   Fall 01/21/2017   Synovial cyst 01/21/2017   Chronic pain 09/11/2016   Generalized  weakness 05/22/2016   UTI (urinary tract infection) 05/22/2016   Weakness 05/22/2016   Fever blister 10/18/2015   Dyspnea 10/17/2015   Bradycardia 09/17/2015   Chest pain at rest 09/17/2015   CAD in native artery 09/17/2015   CHF (congestive heart failure) ([place]) 09/17/2015   Pulmonary hypertension ([place]) 09/17/2015   Bladder cancer ([place]) 09/17/2015   Essential tremor 09/17/2015   Chest pain 09/17/2015   Anginal chest pain at rest [facility]) 09/17/2015   Hypokalemia 07/13/2015   Vertigo 07/01/2015   Dizzinesses 07/01/2015   Spinal stenosis of lumbar region at multiple levels 07/06/2014   Angular cheilitis 05/05/2014   Left lumbar radiculopathy 05/04/2014   Right elbow pain 12/29/2013   Lateral epicondylitis of right elbow 12/29/2013   Cough 09/14/2013   Spinal stenosis, lumbar 07/27/2013   Palpable mass of neck 07/21/2013   Vasovagal episode 01/10/2013   Left otitis media 10/28/2012   Impaired glucose tolerance 10/28/2012   Cellulitis 04/24/2012   Hemorrhoids 04/24/2012   Syncope 04/21/2012   Depression 09/12/2010   RENAL ARTERY STENOSIS 09/12/2010   FATIGUE 09/12/2010   BUNION, RIGHT FOOT 02/05/2010   RECTAL BLEEDING 07/10/2009   Diarrhea 07/10/2009   DIVERTICULOSIS OF COLON 03/28/2009   FATTY LIVER DISEASE 03/28/2009   BRADYCARDIA 03/20/2009   DIZZINESS 10/17/2008   CHEST PAIN 10/17/2008   Malignant neoplasm of bladder ([place]) 02/07/2008  Allergic rhinitis 02/07/2008   COLONIC POLYPS, HX OF 02/07/2008   Hyperlipidemia 11/24/2007   GERD 11/24/2007   HIATAL HERNIA WITH REFLUX 11/24/2007   CKD (chronic kidney disease) stage 3, GFR 30-59 ml/min (HCC) 11/24/2007   Rheumatoid arthritis ([place]) 11/24/2007   [place] DISEASE, LUMBAR 11/24/2007   ADENOIDECTOMY, HX OF 11/24/2007   Past Medical History:  Diagnosis Date   Acute bronchitis per pt no fever--  cough since discharge from hospital 07-02-2015   per pcp note 07-05-2015  mild to moderate bronchitis vs pna   Allergic rhinitis     Bladder cancer (HCC)    BPH (benign prostatic hyperplasia)    Bradycardia    CAD (coronary artery disease)    CHF (congestive heart failure) (HCC)    Chronic low back pain    COPD (chronic obstructive pulmonary disease) (HCC)    DDD (degenerative disc disease), lumbosacral    Depression    Diverticulosis of colon (without mention of hemorrhage)    Dyspnea    Essential tremor    Fatty liver disease, nonalcoholic    GERD (gastroesophageal reflux disease)    Glaucoma    History of bladder cancer    2004--  TCC low-grade , non-invasive   History of colon polyps    History of exercise stress test    02-05-2007-- ETT (Clinically positive, electrically equivocal, submaximal ETT,  appropriate bp response to exercise   History of hiatal hernia    History of kidney stones    History of peptic ulcer    HLD (hyperlipidemia)    HTN (hypertension)    Nocturia    Pneumonia    Productive cough    Rheumatoid arthritis(714.0)    Right renal artery stenosis ([place])    per cath 03-14-2009 --- 40-50%   RLS (restless legs syndrome)    Stroke (HCC)    TIA   Vertigo, intermittent    Weak urinary stream    Wears dentures    upper   Wears glasses    Wears hearing aid    bilateral   Wears partial dentures    lower    Past Surgical History:  Procedure Laterality Date   CARDIAC CATHETERIZATION  02-12-2007  dr gregg taylor   Non-obstructive CAD,  20% pLCX,  20% LAD, ef 65%   CARDIAC CATHETERIZATION  03-14-2009  dr Johnsie Cancel   pLAD and mid diaginal 20-30% multiple lesions,  OM1 20%, right renal ostial 40-50%,  ef 60%   CATARACT EXTRACTION W/ INTRAOCULAR LENS  IMPLANT, BILATERAL  07/14/2014   CYSTOSCOPY WITH BIOPSY N/A 07/11/2015   Procedure: CYSTOSCOPY WITH COLD CUP RESECTION AND FULGERATION;  Surgeon: Rana Snare, MD;  Location[facility];  Service: Urology;  Laterality: N/A;   HIP ARTHROSCOPY W/ LABRAL DEBRIDEMENT Left 06/29/2000   and chondraplasty   JOINT REPLACEMENT      right big toe replacement   LUMBAR DISC SURGERY  07/16/2005   LUMBAR LAMINECTOMY/DECOMPRESSION MICRODISCECTOMY  07/09/2011   Procedure: LUMBAR LAMINECTOMY/DECOMPRESSION MICRODISCECTOMY;  Surgeon: Johnn Hai;  Location: [facility];  Service: Orthopedics;  Laterality: Left;  Decompression L5 - S1 on the Left and repair of dura   LUMBAR LAMINECTOMY/DECOMPRESSION MICRODISCECTOMY N/A 07/27/2013   Procedure: DECOMPRESSION L4 - L5 WITH EXCISION OF SYNOVIAL CYST AND, LATERAL MASS FUSION 1 LEVEL;  Surgeon: Johnn Hai, MD;  Location: [facility];  Service: Orthopedics;  Laterality: N/A;   LUMBAR LAMINECTOMY/DECOMPRESSION MICRODISCECTOMY Left 07/06/2014   Procedure:  MICRO LUMBAR DECOMPRESSION L5-S1 ON LEFT, L4-5  REDO;  Surgeon: Johnn Hai, MD;  Location: [facility];  Service: Orthopedics;  Laterality: Left;   SPINAL CORD STIMULATOR INSERTION N/A 09/11/2016   Procedure: Spinal cord stimulator placement;  Surgeon: Melina Schools, MD;  Location: [place];  Service: Orthopedics;  Laterality: N/A;   TONSILLECTOMY AND ADENOIDECTOMY     TRANSTHORACIC ECHOCARDIOGRAM  09/20/2010   normal LVF, ef 55-65%/  mild MR, TR and PR/  mild LAE   TRANSURETHRAL RESECTION OF BLADDER TUMOR  09/21/2002    Current Outpatient Medications  Medication Sig Dispense Refill Last Dose   acetaminophen (TYLENOL) 650 MG CR tablet Take 1,300 mg by mouth every 8 (eight) hours as needed for pain.      albuterol (PROVENTIL) (2.5 MG/3ML) 0.083% nebulizer solution Take 3 mLs (2.5 mg total) by nebulization every 6 (six) hours as needed for wheezing or shortness of breath. 150 mL 1    albuterol (VENTOLIN HFA) 108 (90 Base) MCG/ACT inhaler Inhale 2 puffs into the lungs every 6 (six) hours as needed for wheezing or shortness of breath. 18 g 11    amLODipine (NORVASC) 10 MG tablet TAKE 1 TABLET EVERY DAY 90 tablet 1    ascorbic acid (VITAMIN C) 500 MG tablet Take 1 tablet (500 mg total) by mouth daily. 30 tablet 0    aspirin EC 81 MG tablet Take 81 mg  by mouth daily. Swallow whole.      benzonatate (TESSALON) 100 MG capsule TAKE 1 CAPSULE(100 MG) BY MOUTH THREE TIMES DAILY AS NEEDED FOR COUGH 90 capsule 1    brimonidine (ALPHAGAN) 0.15 % ophthalmic solution Place 1 drop into both eyes 2 (two) times daily.      budesonide-formoterol (SYMBICORT) 160-4.5 MCG/ACT inhaler Inhale 2 puffs into the lungs 2 (two) times daily. (Patient not taking: No sig reported) 1 Inhaler 3    CANNABIDIOL PO Take 0.5 drops by mouth daily. Oil (CBD)      cetirizine (ZYRTEC) 10 MG tablet Take 10 mg by mouth daily.      Cholecalciferol (VITAMIN D) 125 MCG (5000 UT) CAPS Take 5,000 Units by mouth daily.      citalopram (CELEXA) 20 MG tablet Take 1 tablet (20 mg total) by mouth daily. (Patient taking differently: Take 10 mg by mouth daily.) 90 tablet 2    clonazePAM (KLONOPIN) 0.5 MG tablet TAKE 1 TABLET(0.5 MG) BY MOUTH TWICE DAILY AS NEEDED FOR ANXIETY 60 tablet 2    clotrimazole (LOTRIMIN) 1 % cream Apply 1 application topically 2 (two) times daily as needed (FOR RASH). Prescribed for rash in groin      dextromethorphan-guaiFENesin (MUCINEX DM) 30-600 MG 12hr tablet Take 1 tablet by mouth 2 (two) times daily.      fluticasone (FLONASE) 50 MCG/ACT nasal spray USE 2 SPRAYS IN EACH NOSTRIL DAILY 48 g 1    furosemide (LASIX) 20 MG tablet Take 1 tablet (20 mg total) by mouth 2 (two) times a week. ON TUESDAYS AND SATURDAYS 24 tablet 1    gabapentin (NEURONTIN) 300 MG capsule Take 900-1,200 mg by mouth at bedtime.      hydrALAZINE (APRESOLINE) 10 MG tablet TAKE 1 TABLET THREE TIMES DAILY AS NEEDED FOR BLOOD PRESSURE MORE THAN 150/90 (Patient taking differently: Take 10 mg by mouth 3 (three) times daily.) 270 tablet 1    HYDROcodone-acetaminophen (NORCO/VICODIN) 5-325 MG tablet Take 1 tablet by mouth every 6 (six) hours as needed for severe pain. 10 tablet 0    hydroxypropyl methylcellulose / hypromellose (ISOPTO TEARS /  GONIOVISC) 2.5 % ophthalmic solution Place 1 drop into both  eyes 2 (two) times daily.      Insulin Pen Needle 29G X 10MM MISC Use as directed 200 each 0    lidocaine (LIDODERM) 5 % Place 1 patch onto the skin daily as needed (pain). Remove & Discard patch within 12 hours or as directed by MD      LORazepam (ATIVAN) 1 MG tablet Take 1 mg by mouth daily as needed for anxiety.      losartan (COZAAR) 100 MG tablet Take 1 tablet (100 mg total) by mouth daily. 90 tablet 2    meclizine (ANTIVERT) 25 MG tablet TAKE 1 TABLET TWICE DAILY AS NEEDED FOR DIZZINESS (Patient taking differently: Take 25 mg by mouth 2 (two) times daily.) 180 tablet 2    methocarbamol (ROBAXIN) 500 MG tablet Take 500 mg by mouth 3 (three) times daily as needed for muscle spasms.      nitroGLYCERIN (NITROSTAT) 0.4 MG SL tablet PLACE 1 TABLET UNDER THE TONGUE EVERY 5 MINUTES AS NEEDED FOR CHEST PAIN. MAX 3 DOSES. CALL 911 IF NO IMPROVEMENT AFTER 1ST DOSE 25 tablet 0    omeprazole (PRILOSEC) 40 MG capsule TAKE 1 CAPSULE TWICE DAILY 180 capsule 3    ondansetron (ZOFRAN) 4 MG tablet TAKE 1 TABLET EVERY 8 HOURS AS NEEDED FOR NAUSEA OR VOMITING 30 tablet 2    potassium chloride (KLOR-CON) 10 MEQ tablet Take one tablet daily for 1 week then take one tablet on on tuesdays and saturdays. 30 tablet 1    primidone (MYSOLINE) 250 MG tablet TAKE 1 TABLET (250 MG TOTAL) BY MOUTH 4 (FOUR) TIMES DAILY. (Patient taking differently: Take 125 mg by mouth 3 (three) times daily.) 360 tablet 3    simvastatin (ZOCOR) 40 MG tablet TAKE 1 AND 1/2 TABLETS AT BEDTIME 135 tablet 1    tamsulosin (FLOMAX) 0.4 MG CAPS capsule Take 1 capsule (0.4 mg total) by mouth daily. 90 capsule 3    timolol (TIMOPTIC) 0.5 % ophthalmic solution Place 1 drop into both eyes 2 (two) times daily.   1    Tiotropium Bromide Monohydrate (SPIRIVA RESPIMAT) 2.5 MCG/ACT AERS Inhale 2.5 mcg into the lungs daily.      Travoprost, BAK Free, (TRAVATAN Z) 0.004 % SOLN ophthalmic solution Place 1 drop into both eyes at bedtime. 5 mL 5    triamcinolone  (KENALOG) 0.025 % cream Apply 1 application topically 2 (two) times daily as needed (rash).       vitamin B-12 (CYANOCOBALAMIN) 500 MCG tablet Take 500 mcg by mouth daily.      No current facility-administered medications for this visit.   Allergies  Allergen Reactions   Morphine And Related Other (See Comments)    Migraine    Sulfa Antibiotics Rash and Itching    Patient  Is ok to use paper tape   Hydrochlorothiazide Other (See Comments)    REACTION: hyponatremia with daily use   Tape Itching    Patient  Is ok to use paper tape   Hytrin [Terazosin] Other (See Comments)    Dizziness   Prednisone Other (See Comments)    Caused problem with blood sugar   Latex Rash   Lisinopril Cough   Sulfur Dioxide Itching and Rash    Social History   Tobacco Use   Smoking status: Former    Packs/day: 2.00    Years: 20.00    Pack years: 40.00    Types: Cigarettes    Quit  date: 10/08/1982    Years since quitting: 38.4   Smokeless tobacco: Never  Substance Use Topics   Alcohol use: Not Currently    Comment: rare wine or beer occassionally    Family History  Problem Relation Age of Onset   Heart attack Mother    Cancer Father        lung   Cancer Sister        lung   Diabetes Sister    Cancer Brother        lung that spread to brain    Review of Systems Pertinent items are noted in HPI.  Objective:   Vitals: Ht: 5 ft 7 in 03/22/2021 01:24 pm BP: 130/70 03/22/2021 01:26 pm Pulse: 64 bpm 03/22/2021 01:26 pm  General: Alert and oriented 3, no apparent distress.  Heart: Regular rate and rhythm, no rubs, murmurs, or gallops  Lungs: Clear to auscultation bilaterally  Abdomen: Bowel sounds 4. Nondistended, nontender. No rebound tenderness or loss of bladder or bowel control  Peripheral pulses: 2+ dorsalis pedis/posterior tibialis pulses bilaterally. Compartment soft and nontender.  Gait pattern: Altered gait pattern due to severe back and radicular leg pain. Limited  mobility due to underlying pulmonary issues currently using A motorized scooter  Neuro: Positive numbness and dysesthesias into the left lower extremity. 5/5 motor strength in the lower extremity. Negative straight leg raise test. No clonus, negative Babinski test.  CT scan of the lumbar spine/abdomen: completed on 09/2020 was reviewed with the patient. It was completed at [facility]; I have independently reviewed the images as well as the radiology report. Chronic compression fractures of T12 and L1. T12 fracture is very mild does not appear to be changed from prior CT a year earlier. L1 appears to have slightly progressed.  Assessment:   Tivis is a very pleasant 85 year old with underlying pulmonary issues who has chronic pain. His spinal cord stimulator battery was functioning until recently. It has been interrogated and is no longer functioning properly. In addition he has upper lumbar pain most likely from the L1 compression fracture.  Treatment plan: At this point time I think it is reasonable to move forward with replacing the spinal cord stimulator as well as the L1 kyphoplasty. I will he does have a slight fracture at T12 it is not significantly changed from 1 year prior and I do not think it is part of his pain source. I gone over the surgical procedure with the patient and his son and all their questions were addressed. Under IV sedation and local anesthesia we will move forward with an L1 kyphoplasty, as well as replacing the spinal cord stimulator battery.  Risks and benefits of surgery were discussed with the patient. These include: Infection, bleeding, death, stroke, paralysis, ongoing or worse pain, need for additional surgery, Failure of the battery requiring reoperation. Damage injury to the leads requiring the surgery to be aborted. Migration of the lead, failure to obtain similar results.  Risks of surgery include: Infection, bleeding, death, stroke, paralysis, nerve damage,  leak of cement, need for additional surgery including open decompression. Ongoing or worse pain. Goals of surgery: Reduction in pain, and improvement in quality of life   Plan:   We will obtain preoperative medical clearance from the patient's primary care provider.  I reviewed patient's medication list. He is not on any blood thinners. He is holding aspirin. He is not taking any anti-inflammatories. He is also holding over-the-counter vitamins and supplements.  Discharge instructions were given to  the patient and reviewed.  We have also discussed the post-operative recovery period to include: bathing/showering restrictions, wound healing, activity (and driving) restrictions, medications/pain mangement.  We have also discussed post-operative redflags to include: signs and symptoms of postoperative infection, DVT/PE.  Follow-up: 2 weeks postop

## 2021-03-22 NOTE — H&P (Deleted)
  The note originally documented on this encounter has been moved the the encounter in which it belongs.  

## 2021-03-22 NOTE — Chronic Care Management (AMB) (Signed)
Chronic Care Management   CCM RN Visit Note  03/22/2021 Name: [name] MRN: [id number] DOB: 1936-02-19  Subjective: [name] is a 85 y.o. year old male who is a primary care patient of Biagio Borg, MD. The care management team was consulted for assistance with disease management and care coordination needs.    Engaged with patient's caregiver/ daughter-in-law [name] by telephone for initial visit in response to provider referral for case management and/or care coordination services.   Consent to Services:  The patient was given information about Chronic Care Management services, agreed to services, and gave verbal consent 02/13/21 prior to initiation of services.  Please see initial visit note for detailed documentation.  Patient agreed to services and verbal consent obtained.   Assessment: Review of patient past medical history, allergies, medications, health status, including review of consultants reports, laboratory and other test data, was performed as part of comprehensive evaluation and provision of chronic care management services.   SDOH (Social Determinants of Health) assessments and interventions performed:  SDOH Interventions    Flowsheet Row Most Recent Value  SDOH Interventions   Food Insecurity Interventions Intervention Not Indicated  Housing Interventions Intervention Not Indicated  Transportation Interventions Intervention Not Indicated  [family transports]      CCM Care Plan Allergies  Allergen Reactions   Morphine And Related Other (See Comments)    Migraine    Sulfa Antibiotics Rash and Itching    Patient  Is ok to use paper tape   Hydrochlorothiazide Other (See Comments)    REACTION: hyponatremia with daily use   Tape Itching    Patient  Is ok to use paper tape   Hytrin [Terazosin] Other (See Comments)    Dizziness   Prednisone Other (See Comments)    Caused problem with blood sugar   Latex Rash   Lisinopril Cough   Sulfur Dioxide Itching and  Rash   Outpatient Encounter Medications as of 03/22/2021  Medication Sig   acetaminophen (TYLENOL) 650 MG CR tablet Take 1,300 mg by mouth every 8 (eight) hours as needed for pain.   albuterol (PROVENTIL) (2.5 MG/3ML) 0.083% nebulizer solution Take 3 mLs (2.5 mg total) by nebulization every 6 (six) hours as needed for wheezing or shortness of breath.   albuterol (VENTOLIN HFA) 108 (90 Base) MCG/ACT inhaler Inhale 2 puffs into the lungs every 6 (six) hours as needed for wheezing or shortness of breath.   amLODipine (NORVASC) 10 MG tablet TAKE 1 TABLET EVERY DAY   ascorbic acid (VITAMIN C) 500 MG tablet Take 1 tablet (500 mg total) by mouth daily.   aspirin EC 81 MG tablet Take 81 mg by mouth daily. Swallow whole.   benzonatate (TESSALON) 100 MG capsule TAKE 1 CAPSULE(100 MG) BY MOUTH THREE TIMES DAILY AS NEEDED FOR COUGH   brimonidine (ALPHAGAN) 0.15 % ophthalmic solution Place 1 drop into both eyes 2 (two) times daily.   budesonide-formoterol (SYMBICORT) 160-4.5 MCG/ACT inhaler Inhale 2 puffs into the lungs 2 (two) times daily. (Patient not taking: No sig reported)   CANNABIDIOL PO Take 0.5 drops by mouth daily. Oil (CBD)   cetirizine (ZYRTEC) 10 MG tablet Take 10 mg by mouth daily.   Cholecalciferol (VITAMIN D) 125 MCG (5000 UT) CAPS Take 5,000 Units by mouth daily.   citalopram (CELEXA) 20 MG tablet Take 1 tablet (20 mg total) by mouth daily. (Patient taking differently: Take 10 mg by mouth daily.)   clonazePAM (KLONOPIN) 0.5 MG tablet TAKE 1 TABLET(0.5 MG)  BY MOUTH TWICE DAILY AS NEEDED FOR ANXIETY   clotrimazole (LOTRIMIN) 1 % cream Apply 1 application topically 2 (two) times daily as needed (FOR RASH). Prescribed for rash in groin   dextromethorphan-guaiFENesin (MUCINEX DM) 30-600 MG 12hr tablet Take 1 tablet by mouth 2 (two) times daily.   fluticasone (FLONASE) 50 MCG/ACT nasal spray USE 2 SPRAYS IN EACH NOSTRIL DAILY   furosemide (LASIX) 20 MG tablet Take 1 tablet (20 mg total) by mouth 2  (two) times a week. ON TUESDAYS AND SATURDAYS   gabapentin (NEURONTIN) 300 MG capsule Take 900-1,200 mg by mouth at bedtime.   hydrALAZINE (APRESOLINE) 10 MG tablet TAKE 1 TABLET THREE TIMES DAILY AS NEEDED FOR BLOOD PRESSURE MORE THAN 150/90 (Patient taking differently: Take 10 mg by mouth 3 (three) times daily.)   HYDROcodone-acetaminophen (NORCO/VICODIN) 5-325 MG tablet Take 1 tablet by mouth every 6 (six) hours as needed for severe pain.   hydroxypropyl methylcellulose / hypromellose (ISOPTO TEARS / GONIOVISC) 2.5 % ophthalmic solution Place 1 drop into both eyes 2 (two) times daily.   Insulin Pen Needle 29G X 10MM MISC Use as directed   lidocaine (LIDODERM) 5 % Place 1 patch onto the skin daily as needed (pain). Remove & Discard patch within 12 hours or as directed by MD   LORazepam (ATIVAN) 1 MG tablet Take 1 mg by mouth daily as needed for anxiety.   losartan (COZAAR) 100 MG tablet Take 1 tablet (100 mg total) by mouth daily.   meclizine (ANTIVERT) 25 MG tablet TAKE 1 TABLET TWICE DAILY AS NEEDED FOR DIZZINESS (Patient taking differently: Take 25 mg by mouth 2 (two) times daily.)   methocarbamol (ROBAXIN) 500 MG tablet Take 500 mg by mouth 3 (three) times daily as needed for muscle spasms.   nitroGLYCERIN (NITROSTAT) 0.4 MG SL tablet PLACE 1 TABLET UNDER THE TONGUE EVERY 5 MINUTES AS NEEDED FOR CHEST PAIN. MAX 3 DOSES. CALL 911 IF NO IMPROVEMENT AFTER 1ST DOSE   omeprazole (PRILOSEC) 40 MG capsule TAKE 1 CAPSULE TWICE DAILY   ondansetron (ZOFRAN) 4 MG tablet TAKE 1 TABLET EVERY 8 HOURS AS NEEDED FOR NAUSEA OR VOMITING   potassium chloride (KLOR-CON) 10 MEQ tablet Take one tablet daily for 1 week then take one tablet on on tuesdays and saturdays.   primidone (MYSOLINE) 250 MG tablet TAKE 1 TABLET (250 MG TOTAL) BY MOUTH 4 (FOUR) TIMES DAILY. (Patient taking differently: Take 125 mg by mouth 3 (three) times daily.)   simvastatin (ZOCOR) 40 MG tablet TAKE 1 AND 1/2 TABLETS AT BEDTIME    tamsulosin (FLOMAX) 0.4 MG CAPS capsule Take 1 capsule (0.4 mg total) by mouth daily.   timolol (TIMOPTIC) 0.5 % ophthalmic solution Place 1 drop into both eyes 2 (two) times daily.    Tiotropium Bromide Monohydrate (SPIRIVA RESPIMAT) 2.5 MCG/ACT AERS Inhale 2.5 mcg into the lungs daily.   Travoprost, BAK Free, (TRAVATAN Z) 0.004 % SOLN ophthalmic solution Place 1 drop into both eyes at bedtime.   triamcinolone (KENALOG) 0.025 % cream Apply 1 application topically 2 (two) times daily as needed (rash).    vitamin B-12 (CYANOCOBALAMIN) 500 MCG tablet Take 500 mcg by mouth daily.   [DISCONTINUED] insulin aspart (NOVOLOG) 100 UNIT/ML FlexPen Inject 4 Units into the skin 3 (three) times daily with meals. (Patient not taking: Reported on 01/19/2020)   [DISCONTINUED] Insulin Detemir (LEVEMIR) 100 UNIT/ML Pen Inject 5 Units into the skin 2 (two) times daily. (Patient not taking: Reported on 01/19/2020)   [DISCONTINUED] ipratropium-albuterol (  DUONEB) 0.5-2.5 (3) MG/3ML SOLN Take 3 mLs by nebulization every 6 (six) hours as needed. (Patient not taking: Reported on 01/19/2020)   No facility-administered encounter medications on file as of 03/22/2021.   Patient Active Problem List   Diagnosis Date Noted   Preop exam for internal medicine 03/07/2021   Other abnormalities of gait and mobility 02/04/2021   Cataract 02/04/2021   Wears partial dentures    Wears glasses    Wears hearing aid    Wears dentures    Weak urinary stream    Vertigo, intermittent    RLS (restless legs syndrome)    Productive cough    Nocturia    History of peptic ulcer    History of kidney stones    History of hiatal hernia    History of exercise stress test    History of colon polyps    History of bladder cancer    Fatty liver disease, nonalcoholic    Glaucoma    DDD (degenerative disc disease), lumbosacral    Chronic low back pain    BPH (benign prostatic hyperplasia)    Acute bronchitis    Aortic atherosclerosis ([place])  04/25/2020   AKI (acute kidney injury) ([place]) 01/23/2020   Chest pain of uncertain etiology 30/86/5784   Essential hypertension 11/12/2019   Dizziness 11/12/2019   Obesity (BMI 30-39.9) 11/12/2019   History of COVID-19 10/03/2019   Acute respiratory failure with hypoxia ([place]) 10/03/2019   Pneumonia due to COVID-19 virus 08/16/2019   Acute respiratory failure due to COVID-19 ([place]) 08/16/2019   COVID-19 virus infection 08/09/2019   Memory loss 06/24/2019   Wheezing 06/24/2019   Abrasion of left arm 04/11/2019   Joint swelling of lower leg 11/29/2018   Abnormal urinalysis 02/25/2018   Anxiety 01/08/2018   Laceration of hand 12/18/2017   Pain of left hand 12/18/2017   Laceration of scalp without foreign body 06/09/2017   Nonintractable headache 06/09/2017   Acute sinus infection 04/14/2017   Nausea and vomiting 03/27/2017   Hyponatremia 03/17/2017   Right lumbar radiculopathy 01/21/2017   Fall 01/21/2017   Synovial cyst 01/21/2017   Chronic pain 09/11/2016   Generalized weakness 05/22/2016   UTI (urinary tract infection) 05/22/2016   Weakness 05/22/2016   Fever blister 10/18/2015   Dyspnea 10/17/2015   Bradycardia 09/17/2015   Chest pain at rest 09/17/2015   CAD in native artery 09/17/2015   CHF (congestive heart failure) ([place]) 09/17/2015   Pulmonary hypertension ([place]) 09/17/2015   Bladder cancer ([place]) 09/17/2015   Essential tremor 09/17/2015   Chest pain 09/17/2015   Anginal chest pain at rest [facility]) 09/17/2015   Hypokalemia 07/13/2015   Vertigo 07/01/2015   Dizzinesses 07/01/2015   Spinal stenosis of lumbar region at multiple levels 07/06/2014   Angular cheilitis 05/05/2014   Left lumbar radiculopathy 05/04/2014   Right elbow pain 12/29/2013   Lateral epicondylitis of right elbow 12/29/2013   Cough 09/14/2013   Spinal stenosis, lumbar 07/27/2013   Palpable mass of neck 07/21/2013   Vasovagal episode 01/10/2013   Left otitis media 10/28/2012   Impaired glucose  tolerance 10/28/2012   Cellulitis 04/24/2012   Hemorrhoids 04/24/2012   Syncope 04/21/2012   Depression 09/12/2010   RENAL ARTERY STENOSIS 09/12/2010   FATIGUE 09/12/2010   BUNION, RIGHT FOOT 02/05/2010   RECTAL BLEEDING 07/10/2009   Diarrhea 07/10/2009   DIVERTICULOSIS OF COLON 03/28/2009   FATTY LIVER DISEASE 03/28/2009   BRADYCARDIA 03/20/2009   DIZZINESS 10/17/2008   CHEST PAIN 10/17/2008  Malignant neoplasm of bladder (HCC) 02/07/2008   Allergic rhinitis 02/07/2008   COLONIC POLYPS, HX OF 02/07/2008   Hyperlipidemia 11/24/2007   GERD 11/24/2007   HIATAL HERNIA WITH REFLUX 11/24/2007   CKD (chronic kidney disease) stage 3, GFR 30-59 ml/min (HCC) 11/24/2007   Rheumatoid arthritis ([place]) 11/24/2007   DISC DISEASE, LUMBAR 11/24/2007   ADENOIDECTOMY, HX OF 11/24/2007   Conditions to be addressed/monitored:  HTN and chronic pain  Care Plan : RN Care Manager Plan of Care  Updates made by Knox Royalty, RN since 03/22/2021 12:00 AM     Problem: Chronic Disease Management Needs   Priority: Medium     Long-Range Goal: Development of plan of care for long term chronic disease management   Start Date: 03/22/2021  Expected End Date: 03/22/2022  Priority: Medium  Note:   Current Barriers:  Chronic Disease Management support and education needs related to HTN and chronic pain  RNCM Clinical Goal(s):  Patient will demonstrate ongoing health management independence HTN; chronic pain  through collaboration with RN Care manager, provider, and care team.   Interventions: 1:1 collaboration with primary care provider regarding development and update of comprehensive plan of care as evidenced by provider attestation and co-signature Inter-disciplinary care team collaboration (see longitudinal plan of care) Evaluation of current treatment plan related to  self management and patient's adherence to plan as established by provider  Hypertension: (Status: New goal.) Last practice recorded  BP readings:  BP Readings from Last 3 Encounters:  03/21/21 139/67  03/07/21 118/60  10/24/20 124/80  Most recent eGFR/CrCl: No results found for: EGFR  No components found for: CRCL  Evaluation of current treatment plan related to hypertension self management and patient's adherence to plan as established by provider;   Reviewed prescribed diet low sodium/ salt, heart healthy Discussed plans with patient for ongoing care management follow up and provided patient with direct contact information for care management team; Advised patient, providing education and rationale, to monitor blood pressure daily and record, calling PCP for findings outside established parameters;  Reviewed scheduled/upcoming provider appointments including:  Discussed complications of poorly controlled blood pressure such as heart disease, stroke, circulatory complications, vision complications, kidney impairment, sexual dysfunction;  Assessed social determinant of health barriers;  Reviewed recent blood pressures at home with caregiver: reports consistent blood pressures between 120-130/ 80-90; confirms patient able to self-monitor blood pressures at home Confirmed that caregiver has no medication concerns: she prepares medications in pill box, patient then takes independently Does not currently complete daily weights at home; reports no lower extremity swelling; provided verbal education around purpose of daily weights in setting of concurrent CHF- caregiver reports fluid status is well controlled with diuretic twice a week; encouraged her to consider possibly beginning monitoring weights at home twice a week Upcoming provider appointments: 05/01/21- cardiology provider; 05/13/21- podiatry: confirmed caregiver will provide transportation  Pain:  (Status: New goal.) Pain assessment performed- caregiver reports patient in constant but well-controlled pain with current interventions/ medications Medications  reviewed Reviewed provider established plan for pain management; Discussed importance of adherence to all scheduled medical appointments; Counseled on the importance of reporting any/all new or changed pain symptoms or management strategies to pain management provider; Reviewed with patient prescribed pharmacological and nonpharmacological pain relief strategies; Confirmed patient is scheduled for replacement of battery in nerve stimulator 03/28/21- caregivers will provide transportation; expected to be outpatient procedure; has had nerve stimulator for a long time; just needs battery replaced; confirmed patient avoids use of narcotic  pain medication and that he is actively followed by pain management clinic and orthopedic providers  Patient Goals/Self-Care Activities: Patient will attend all scheduled provider appointments Patient will call pharmacy for medication refills Patient will call provider office for new concerns or questions Patient will continue to take medications from pill box after they have been prepared by caregiver Patient will continue to follow heart healthy, low salt diet Patient will continue to monitor and write down on paper daily blood pressures at home      Plan: Telephone follow up appointment with care management team member scheduled for:  Thursday April 11, 2021 at 3:30 pm The patient has been provided with contact information for the care management team and has been advised to call with any health related questions or concerns.   Oneta Rack, RN, BSN, [facility] RN Care Coordination- [place] [phone]: direct office [phone]: mobile

## 2021-03-28 ENCOUNTER — Ambulatory Visit (HOSPITAL_COMMUNITY): Payer: Medicare Other | Admitting: Anesthesiology

## 2021-03-28 ENCOUNTER — Encounter (HOSPITAL_COMMUNITY): Payer: Self-pay | Admitting: Orthopedic Surgery

## 2021-03-28 ENCOUNTER — Other Ambulatory Visit: Payer: Self-pay

## 2021-03-28 ENCOUNTER — Encounter (HOSPITAL_COMMUNITY)
Admission: RE | Disposition: A | Discharge status: Home / Self Care | Payer: Self-pay | Source: Home / Self Care | Attending: Orthopedic Surgery

## 2021-03-28 ENCOUNTER — Ambulatory Visit (HOSPITAL_COMMUNITY): Payer: Medicare Other

## 2021-03-28 ENCOUNTER — Ambulatory Visit (HOSPITAL_COMMUNITY): Payer: Medicare Other | Admitting: Emergency Medicine

## 2021-03-28 ENCOUNTER — Ambulatory Visit (HOSPITAL_COMMUNITY)
Admission: RE | Admit: 2021-03-28 | Discharge: 2021-03-28 | Disposition: A | Discharge status: Home / Self Care | Payer: Medicare Other | Attending: Orthopedic Surgery | Admitting: Orthopedic Surgery

## 2021-03-28 ENCOUNTER — Other Ambulatory Visit (HOSPITAL_COMMUNITY): Payer: Self-pay | Admitting: Sports Medicine

## 2021-03-28 DIAGNOSIS — Z885 Allergy status to narcotic agent status: Secondary | ICD-10-CM | POA: Insufficient documentation

## 2021-03-28 DIAGNOSIS — M8008XA Age-related osteoporosis with current pathological fracture, vertebra(e), initial encounter for fracture: Secondary | ICD-10-CM | POA: Diagnosis not present

## 2021-03-28 DIAGNOSIS — I509 Heart failure, unspecified: Secondary | ICD-10-CM | POA: Diagnosis not present

## 2021-03-28 DIAGNOSIS — Y838 Other surgical procedures as the cause of abnormal reaction of the patient, or of later complication, without mention of misadventure at the time of the procedure: Secondary | ICD-10-CM | POA: Diagnosis not present

## 2021-03-28 DIAGNOSIS — Z91040 Latex allergy status: Secondary | ICD-10-CM | POA: Insufficient documentation

## 2021-03-28 DIAGNOSIS — Z888 Allergy status to other drugs, medicaments and biological substances status: Secondary | ICD-10-CM | POA: Diagnosis not present

## 2021-03-28 DIAGNOSIS — Z09 Encounter for follow-up examination after completed treatment for conditions other than malignant neoplasm: Secondary | ICD-10-CM

## 2021-03-28 DIAGNOSIS — Z794 Long term (current) use of insulin: Secondary | ICD-10-CM | POA: Diagnosis not present

## 2021-03-28 DIAGNOSIS — Z8616 Personal history of COVID-19: Secondary | ICD-10-CM | POA: Diagnosis not present

## 2021-03-28 DIAGNOSIS — T85193A Other mechanical complication of implanted electronic neurostimulator, generator, initial encounter: Secondary | ICD-10-CM | POA: Insufficient documentation

## 2021-03-28 DIAGNOSIS — S32010A Wedge compression fracture of first lumbar vertebra, initial encounter for closed fracture: Secondary | ICD-10-CM | POA: Diagnosis not present

## 2021-03-28 DIAGNOSIS — S32019D Unspecified fracture of first lumbar vertebra, subsequent encounter for fracture with routine healing: Secondary | ICD-10-CM | POA: Diagnosis not present

## 2021-03-28 DIAGNOSIS — Z882 Allergy status to sulfonamides status: Secondary | ICD-10-CM | POA: Diagnosis not present

## 2021-03-28 DIAGNOSIS — I2510 Atherosclerotic heart disease of native coronary artery without angina pectoris: Secondary | ICD-10-CM | POA: Diagnosis not present

## 2021-03-28 DIAGNOSIS — Z79899 Other long term (current) drug therapy: Secondary | ICD-10-CM | POA: Insufficient documentation

## 2021-03-28 DIAGNOSIS — Z87891 Personal history of nicotine dependence: Secondary | ICD-10-CM | POA: Insufficient documentation

## 2021-03-28 DIAGNOSIS — Z7982 Long term (current) use of aspirin: Secondary | ICD-10-CM | POA: Insufficient documentation

## 2021-03-28 DIAGNOSIS — I130 Hypertensive heart and chronic kidney disease with heart failure and stage 1 through stage 4 chronic kidney disease, or unspecified chronic kidney disease: Secondary | ICD-10-CM | POA: Diagnosis not present

## 2021-03-28 DIAGNOSIS — N183 Chronic kidney disease, stage 3 unspecified: Secondary | ICD-10-CM | POA: Diagnosis not present

## 2021-03-28 HISTORY — PX: SPINAL CORD STIMULATOR BATTERY EXCHANGE: SHX6202

## 2021-03-28 HISTORY — PX: KYPHOPLASTY: SHX5884

## 2021-03-28 LAB — PROTIME-INR
INR: 1.1 (ref 0.8–1.2)
Prothrombin Time: 14.3 seconds (ref 11.4–15.2)

## 2021-03-28 LAB — APTT: aPTT: 37 seconds — ABNORMAL HIGH (ref 24–36)

## 2021-03-28 SURGERY — SPINAL CORD STIMULATOR BATTERY EXCHANGE
Anesthesia: General | Site: Spine Lumbar

## 2021-03-28 MED ORDER — ONDANSETRON HCL 4 MG/2ML IJ SOLN
INTRAMUSCULAR | Status: AC
  Filled 2021-03-28: qty 2

## 2021-03-28 MED ORDER — KETAMINE HCL 50 MG/5ML IJ SOSY
PREFILLED_SYRINGE | INTRAMUSCULAR | Status: AC
  Filled 2021-03-28: qty 5

## 2021-03-28 MED ORDER — ONDANSETRON HCL 4 MG PO TABS: 4.0000 mg | ORAL_TABLET | Freq: Three times a day (TID) | ORAL | 0 refills | Status: AC | PRN

## 2021-03-28 MED ORDER — FENTANYL CITRATE (PF) 250 MCG/5ML IJ SOLN
INTRAMUSCULAR | Status: AC
  Filled 2021-03-28: qty 5

## 2021-03-28 MED ORDER — PROPOFOL 1000 MG/100ML IV EMUL
INTRAVENOUS | Status: AC
  Filled 2021-03-28: qty 300

## 2021-03-28 MED ORDER — BUPIVACAINE-EPINEPHRINE 0.25% -1:200000 IJ SOLN
INTRAMUSCULAR | Status: DC | PRN
  Administered 2021-03-28: 24 mL
  Administered 2021-03-28: 6 mL

## 2021-03-28 MED ORDER — CEFAZOLIN SODIUM-DEXTROSE 2-4 GM/100ML-% IV SOLN
2.0000 g | INTRAVENOUS | Status: AC
  Administered 2021-03-28: 2 g via INTRAVENOUS
  Filled 2021-03-28: qty 100

## 2021-03-28 MED ORDER — TRAMADOL HCL 50 MG PO TABS: 50.0000 mg | ORAL_TABLET | Freq: Four times a day (QID) | ORAL | 0 refills | Status: AC | PRN

## 2021-03-28 MED ORDER — PROPOFOL 10 MG/ML IV BOLUS
INTRAVENOUS | Status: AC
  Filled 2021-03-28: qty 20

## 2021-03-28 MED ORDER — BUPIVACAINE HCL (PF) 0.25 % IJ SOLN
INTRAMUSCULAR | Status: AC
  Filled 2021-03-28: qty 30

## 2021-03-28 MED ORDER — LACTATED RINGERS IV SOLN: INTRAVENOUS | Status: DC

## 2021-03-28 MED ORDER — LIDOCAINE 2% (20 MG/ML) 5 ML SYRINGE
INTRAMUSCULAR | Status: DC | PRN
  Administered 2021-03-28: 80 mg via INTRAVENOUS

## 2021-03-28 MED ORDER — BUPIVACAINE LIPOSOME 1.3 % IJ SUSP
INTRAMUSCULAR | Status: DC | PRN
  Administered 2021-03-28: 20 mL

## 2021-03-28 MED ORDER — LIDOCAINE 2% (20 MG/ML) 5 ML SYRINGE
INTRAMUSCULAR | Status: AC
  Filled 2021-03-28: qty 5

## 2021-03-28 MED ORDER — PROPOFOL 500 MG/50ML IV EMUL
INTRAVENOUS | Status: DC | PRN
  Administered 2021-03-28: 25 ug/kg/min via INTRAVENOUS

## 2021-03-28 MED ORDER — ONDANSETRON HCL 4 MG/2ML IJ SOLN
INTRAMUSCULAR | Status: DC | PRN
  Administered 2021-03-28: 4 mg via INTRAVENOUS

## 2021-03-28 MED ORDER — MIDAZOLAM HCL 2 MG/2ML IJ SOLN
INTRAMUSCULAR | Status: AC
  Filled 2021-03-28: qty 2

## 2021-03-28 MED ORDER — MIDAZOLAM HCL 5 MG/5ML IJ SOLN
INTRAMUSCULAR | Status: DC | PRN
  Administered 2021-03-28 (×2): 1 mg via INTRAVENOUS

## 2021-03-28 MED ORDER — BUPIVACAINE-EPINEPHRINE (PF) 0.25% -1:200000 IJ SOLN
INTRAMUSCULAR | Status: AC
  Filled 2021-03-28: qty 30

## 2021-03-28 MED ORDER — FENTANYL CITRATE (PF) 250 MCG/5ML IJ SOLN
INTRAMUSCULAR | Status: DC | PRN
  Administered 2021-03-28 (×4): 25 ug via INTRAVENOUS

## 2021-03-28 MED ORDER — ORAL CARE MOUTH RINSE: 15.0000 mL | Freq: Once | OROMUCOSAL | Status: AC

## 2021-03-28 MED ORDER — CHLORHEXIDINE GLUCONATE 0.12 % MT SOLN
15.0000 mL | Freq: Once | OROMUCOSAL | Status: AC
  Administered 2021-03-28: 15 mL via OROMUCOSAL
  Filled 2021-03-28: qty 15

## 2021-03-28 MED ORDER — IOPAMIDOL (ISOVUE-300) INJECTION 61%
INTRAVENOUS | Status: DC | PRN
  Administered 2021-03-28: 25 mL

## 2021-03-28 MED ORDER — KETAMINE HCL 10 MG/ML IJ SOLN
INTRAMUSCULAR | Status: DC | PRN
  Administered 2021-03-28 (×6): 5 mg via INTRAVENOUS

## 2021-03-28 MED ORDER — BUPIVACAINE HCL (PF) 0.25 % IJ SOLN
INTRAMUSCULAR | Status: DC | PRN
  Administered 2021-03-28: 10 mL

## 2021-03-28 MED ORDER — 0.9 % SODIUM CHLORIDE (POUR BTL) OPTIME
TOPICAL | Status: DC | PRN
  Administered 2021-03-28: 2000 mL

## 2021-03-28 MED ORDER — ROCURONIUM BROMIDE 10 MG/ML (PF) SYRINGE
PREFILLED_SYRINGE | INTRAVENOUS | Status: AC
  Filled 2021-03-28: qty 10

## 2021-03-28 MED ORDER — BUPIVACAINE LIPOSOME 1.3 % IJ SUSP
INTRAMUSCULAR | Status: AC
  Filled 2021-03-28: qty 20

## 2021-03-28 SURGICAL SUPPLY — 69 items
BAG COUNTER SPONGE SURGICOUNT (BAG) ×2 IMPLANT
BLADE SURG 15 STRL LF DISP TIS (BLADE) ×1 IMPLANT
BLADE SURG 15 STRL SS (BLADE) ×2
BNDG ADH 1X3 SHEER STRL LF (GAUZE/BANDAGES/DRESSINGS) ×4 IMPLANT
CANISTER SUCT 3000ML PPV (MISCELLANEOUS) ×2 IMPLANT
CEMENT KYPHON CX01A KIT/MIXER (Cement) ×2 IMPLANT
CLSR STERI-STRIP ANTIMIC 1/2X4 (GAUZE/BANDAGES/DRESSINGS) ×2 IMPLANT
CORD BIPOLAR FORCEPS 12FT (ELECTRODE) ×2 IMPLANT
COVER MAYO STAND STRL (DRAPES) ×2 IMPLANT
COVER SURGICAL LIGHT HANDLE (MISCELLANEOUS) ×2 IMPLANT
CURETTE EXPRESS SZ2 7MM (INSTRUMENTS) IMPLANT
CURETTE WEDGE 8.5MM KYPHX (MISCELLANEOUS) IMPLANT
CURRETTE EXPRESS SZ2 7MM (INSTRUMENTS) ×2
DERMABOND ADVANCED (GAUZE/BANDAGES/DRESSINGS)
DERMABOND ADVANCED .7 DNX12 (GAUZE/BANDAGES/DRESSINGS) ×1 IMPLANT
DRAIN CHANNEL 15F RND FF W/TCR (WOUND CARE) IMPLANT
DRAPE C-ARM 42X72 X-RAY (DRAPES) ×4 IMPLANT
DRAPE INCISE IOBAN 66X45 STRL (DRAPES) ×4 IMPLANT
DRAPE LAPAROTOMY 100X72 PEDS (DRAPES) ×2 IMPLANT
DRAPE LAPAROTOMY T 102X78X121 (DRAPES) ×2 IMPLANT
DRAPE POUCH INSTRU U-SHP 10X18 (DRAPES) ×2 IMPLANT
DRAPE SURG 17X23 STRL (DRAPES) ×2 IMPLANT
DRAPE U-SHAPE 47X51 STRL (DRAPES) ×2 IMPLANT
DRAPE WARM FLUID 44X44 (DRAPES) ×2 IMPLANT
DRSG AQUACEL AG ADV 3.5X 6 (GAUZE/BANDAGES/DRESSINGS) ×1 IMPLANT
DRSG OPSITE POSTOP 4X6 (GAUZE/BANDAGES/DRESSINGS) ×1 IMPLANT
DURAPREP 26ML APPLICATOR (WOUND CARE) ×3 IMPLANT
ELECT CAUTERY BLADE 6.4 (BLADE) ×2 IMPLANT
ELECT PENCIL ROCKER SW 15FT (MISCELLANEOUS) ×2 IMPLANT
ELECT REM PT RETURN 9FT ADLT (ELECTROSURGICAL) ×2
ELECTRODE REM PT RTRN 9FT ADLT (ELECTROSURGICAL) ×1 IMPLANT
GENERATOR PULSE PROCLAIM 5ELIT (Neuro Prosthesis/Implant) IMPLANT
GLOVE SURG ENC MOIS LTX SZ6.5 (GLOVE) ×4 IMPLANT
GLOVE SURG MICRO LTX SZ8.5 (GLOVE) ×6 IMPLANT
GLOVE SURG UNDER POLY LF SZ6.5 (GLOVE) ×4 IMPLANT
GLOVE SURG UNDER POLY LF SZ8.5 (GLOVE) ×2 IMPLANT
GOWN STRL REUS W/ TWL LRG LVL3 (GOWN DISPOSABLE) ×4 IMPLANT
GOWN STRL REUS W/TWL 2XL LVL3 (GOWN DISPOSABLE) ×4 IMPLANT
GOWN STRL REUS W/TWL LRG LVL3 (GOWN DISPOSABLE) ×8
KIT BASIN OR (CUSTOM PROCEDURE TRAY) ×4 IMPLANT
KIT TURNOVER KIT B (KITS) ×4 IMPLANT
NDL SPNL 22GX3.5 QUINCKE BK (NEEDLE) ×1 IMPLANT
NDL SUT 6 .5 CRC .975X.05 MAYO (NEEDLE) ×1 IMPLANT
NEEDLE 22X1 1/2 (OR ONLY) (NEEDLE) IMPLANT
NEEDLE HYPO 22GX1.5 SAFETY (NEEDLE) ×2 IMPLANT
NEEDLE MAYO TAPER (NEEDLE)
NEEDLE SPNL 22GX3.5 QUINCKE BK (NEEDLE) ×2 IMPLANT
NS IRRIG 1000ML POUR BTL (IV SOLUTION) ×4 IMPLANT
PACK GENERAL/GYN (CUSTOM PROCEDURE TRAY) ×2 IMPLANT
PACK SURGICAL SETUP 50X90 (CUSTOM PROCEDURE TRAY) ×2 IMPLANT
PACK UNIVERSAL I (CUSTOM PROCEDURE TRAY) ×2 IMPLANT
PAD ARMBOARD 7.5X6 YLW CONV (MISCELLANEOUS) ×6 IMPLANT
PROGRAMMER DBS W/MAGNET NMRI (MISCELLANEOUS) ×1 IMPLANT
PULSE GENERATOR PROCLAIM 5ELIT (Neuro Prosthesis/Implant) ×2 IMPLANT
SPONGE T-LAP 4X18 ~~LOC~~+RFID (SPONGE) ×5 IMPLANT
STAPLER VISISTAT 35W (STAPLE) IMPLANT
SUT ETHIBOND NAB CT1 #1 30IN (SUTURE) ×4 IMPLANT
SUT MNCRL AB 3-0 PS2 18 (SUTURE) ×2 IMPLANT
SUT MNCRL AB 3-0 PS2 27 (SUTURE) ×4 IMPLANT
SUT VIC AB 1 CT1 18XCR BRD 8 (SUTURE) ×1 IMPLANT
SUT VIC AB 1 CT1 8-18 (SUTURE) ×2
SUT VIC AB 2-0 CT1 18 (SUTURE) ×3 IMPLANT
SYR BULB IRRIG 60ML STRL (SYRINGE) ×2 IMPLANT
SYR CONTROL 10ML LL (SYRINGE) ×2 IMPLANT
TOWEL GREEN STERILE (TOWEL DISPOSABLE) ×4 IMPLANT
TOWEL GREEN STERILE FF (TOWEL DISPOSABLE) ×4 IMPLANT
TRAY KYPHOPAK 15/3 ONESTEP 1ST (MISCELLANEOUS) ×1 IMPLANT
TRAY KYPHOPAK 20/3 ONESTEP 1ST (MISCELLANEOUS) IMPLANT
WATER STERILE IRR 1000ML POUR (IV SOLUTION) ×4 IMPLANT

## 2021-03-28 NOTE — Op Note (Signed)
OPERATIVE REPORT  DATE OF SURGERY: 03/28/2021  PATIENT NAME:  [name] MRN: [id number] DOB: Dec 05, 1935  PCP: Biagio Borg, MD  PRE-OPERATIVE DIAGNOSIS: L1 osteoporotic compression fracture.  Failed spinal cord stimulator battery  POST-OPERATIVE DIAGNOSIS: Same  PROCEDURE:   L1 kyphoplasty. Spinal cord stimulator battery exchange  SURGEON:  Melina Schools, MD  PHYSICIAN ASSISTANT: Nelson Chimes, PA  ANESTHESIA: IV sedation with local anesthesia  EBL: 5 ml   Complications: None  Implants: Abbott spine: Proclaim XR battery  BRIEF HISTORY: [name] is a 85 y.o. male who had a spinal cord stimulator placed several years ago and it was functioning well until recently.  The battery no longer hold a charge and it was interrogated and noted to be nonfunctioning.  In addition he was having upper lumbar pain and a CT scan demonstrated a compression fracture of L1.  As result of the significant pain we elected to move forward with replacing the battery and addressing the compression fracture.  All appropriate risks, benefits and alternatives to surgery were discussed with the patient and consent was obtained.  PROCEDURE DETAILS: Patient was brought into the operating room and was properly positioned on the operating room table.  A timeout was taken to confirm all important data: including patient, procedure, and the level. Teds, SCD's were applied.   The back was prepped and draped in a standard fashion.  Using fluoroscopy I marked out the lateral borders of the pedicle of L1.  I then injected quarter percent Marcaine with epinephrine with Exparel to obtain local anesthesia.  A spinal needle was then advanced down to the lateral aspect of the facet complex and Marcaine with Exparel was injected here for improved intraoperative analgesia.  Once IV sedation and local anesthesia was complete I made a small incision and advanced the Jamshidi needle to the lateral aspect of the L1 pedicle.   Using fluoroscopy in the lateral and AP plane identify the proper position and trajectory.  I advanced the Jamshidi needle into the L1 pedicle.  I confirmed that as it was nearing the medial aspect of the pedicle I was just at the posterior wall of the vertebral body on the lateral view.  I advanced into the vertebral body.  Once both pedicles were cannulated I then placed the drill and then palpated the old future and it was a solid bony canal.  I then inserted the curette to break apart the cancellous bone to facilitate the inflatable bone tamp.  I inflated the bone tamp to see appropriate position and then deflated them and inserted the cement.  A total of approximately 4 cc of cement was placed.  Under fluoroscopic guidance to confirm satisfactory insertion of the cement.  There was no leak noted.  The cement was allowed to harden and the Jamshidi needles were removed.  The skin was cleaned and the stab incision sites were closed with interrupted #1 Vicryl simple sutures.  At this point I turned my attention to the battery site.  I used the local anesthesia to anesthetize the area and I supplemented this with quarter percent plain Marcaine.  The incision was made and sharply dissected down to the battery.  I incised the capsule at the battery had developed and gently manipulated it so that it would be delivered out of the wound.  I then disconnected the leads and then inserted the new leads into the new battery.  They were then locked into place according manufactures to the battery site  was irrigated copiously with normal saline and then I placed the battery back into the pocket and I coiled the remaining leads and placed underneath the battery.  The battery was then secured to the fascia with 2 interrupted #1 Vicryl sutures.  The battery was again tested by the representative and it was noted to be functioning.  The wound was then closed in a layered fashion with interrupted #1 Vicryl suture, 2-0 Vicryl  suture, 3-0 Monocryl stitch.  Steri-Strips and dry dressings were applied and the patient was ultimately extubated transferred to the PACU without incident.  At the end of the case all needle and sponge counts were correct.  1st assistant Nelson Chimes: Instrumental in assisting with positioning the patient retraction for visualization, as well as wound closure.  Melina Schools, MD 03/28/2021 12:00 PM

## 2021-03-28 NOTE — Transfer of Care (Signed)
Immediate Anesthesia Transfer of Care Note  Patient: [name]  Procedure(s) Performed: SPINAL CORD STIMULATOR BATTERY EXCHANGE/REPLACEMENT, L1 KYPHOPLASTY (Spine Lumbar) KYPHOPLASTY lumbar one (Spine Lumbar)  Patient Location: PACU  Anesthesia Type:MAC  Level of Consciousness: awake  Airway & Oxygen Therapy: Patient Spontanous Breathing  Post-op Assessment: Report given to RN  Post vital signs: stable  Last Vitals:  Vitals Value Taken Time  BP 110/59 03/28/21 1216  Temp 36.3 C 03/28/21 1215  Pulse 48 03/28/21 1225  Resp 23 03/28/21 1225  SpO2 97 % 03/28/21 1225  Vitals shown include unvalidated device data.  Last Pain:  Vitals:   03/28/21 1215  TempSrc:   PainSc: 0-No pain         Complications: No notable events documented.

## 2021-03-28 NOTE — H&P (Signed)
Addendum H&P: Patient continues to have significant back pain with a failed spinal cord stimulator battery.  There is been no change in his clinical exam since his last office visit of 03/22/2021.  We will plan on moving forward with the L1 kyphoplasty with spinal cord stimulator battery replacement.  I have reviewed the risks, benefits, and alternatives to surgery with the patient and consent was confirmed.

## 2021-03-28 NOTE — Brief Op Note (Signed)
03/28/2021  12:08 PM  PATIENT:  [name]  85 y.o. male  PRE-OPERATIVE DIAGNOSIS:  Chronic compression fracture L1, failed spinal cord stimulator battery  POST-OPERATIVE DIAGNOSIS:  Chronic compression fracture L1, failed spinal cord stimulator battery  PROCEDURE:  Procedure(s) with comments: SPINAL CORD STIMULATOR BATTERY EXCHANGE/REPLACEMENT, L1 KYPHOPLASTY (N/A) - 90 MINS LOCAL WITH IV REG KYPHOPLASTY lumbar one (N/A)  SURGEON:  Surgeon(s) and Role:    Melina Schools, MD - Primary  PHYSICIAN ASSISTANT:   ASSISTANTS: Amanda Ward, PA   ANESTHESIA:   local and IV sedation  EBL:  4 mL   BLOOD ADMINISTERED:none  DRAINS: none   LOCAL MEDICATIONS USED:  MARCAINE    and OTHER exparel  SPECIMEN:  No Specimen  DISPOSITION OF SPECIMEN:  N/A  COUNTS:  YES  TOURNIQUET:  * No tourniquets in log *  DICTATION: .Dragon Dictation  PLAN OF CARE: Discharge to home after PACU  PATIENT DISPOSITION:  PACU - hemodynamically stable.

## 2021-03-29 ENCOUNTER — Encounter (HOSPITAL_COMMUNITY): Payer: Self-pay | Admitting: Orthopedic Surgery

## 2021-04-04 NOTE — Anesthesia Postprocedure Evaluation (Signed)
Anesthesia Post Note  Patient: [name]  Procedure(s) Performed: SPINAL CORD STIMULATOR BATTERY EXCHANGE/REPLACEMENT, L1 KYPHOPLASTY (Spine Lumbar) KYPHOPLASTY lumbar one (Spine Lumbar)     Patient location during evaluation: PACU Anesthesia Type: General Level of consciousness: awake Pain management: pain level controlled Vital Signs Assessment: post-procedure vital signs reviewed and stable Respiratory status: spontaneous breathing Cardiovascular status: stable Postop Assessment: no apparent nausea or vomiting Anesthetic complications: no   No notable events documented.  Last Vitals:  Vitals:   03/28/21 1215 03/28/21 1230  BP: (!) 110/59 122/65  Pulse: (!) 50 (!) 51  Resp: 17 16  Temp: (!) 36.3 C 36.6 C  SpO2: 98% 98%    Last Pain:  Vitals:   03/28/21 1230  TempSrc:   PainSc: 0-No pain                 Huston Foley

## 2021-04-09 ENCOUNTER — Ambulatory Visit (INDEPENDENT_AMBULATORY_CARE_PROVIDER_SITE_OTHER): Payer: Medicare Other | Admitting: *Deleted

## 2021-04-09 DIAGNOSIS — Z0000 Encounter for general adult medical examination without abnormal findings: Secondary | ICD-10-CM | POA: Diagnosis not present

## 2021-04-09 NOTE — Progress Notes (Addendum)
Subjective:   [name] is a 85 y.o. male who presents for Medicare Annual/Subsequent preventive examination.  I connected with  [name] on 04/09/21 by audio enabled telemedicine application and verified that I am speaking with the correct person using two identifiers.   I discussed the limitations of evaluation and management by telemedicine. The patient expressed understanding and agreed to proceed.  Location of Patient: Home Location of Provider: Office Persons participating in visit: [name] (patient), [name] (son & wife) & Jari Favre, CMA   Review of Systems    Defer to PCP Cardiac Risk Factors include: none     Objective:    There were no vitals filed for this visit. There is no height or weight on file to calculate BMI.  Advanced Directives 04/09/2021 03/28/2021 03/21/2021 01/02/2020 10/24/2019 08/17/2019 07/22/2019  Does Patient Have a Medical Advance Directive? Yes No No Yes Yes No No  Type of Paramedic of [place];Living will - - - - - -  Does patient want to make changes to medical advance directive? No - Patient declined - - No - Patient declined - - -  Copy of [place] in Chart? No - copy requested - - - - - -  Would patient like information on creating a medical advance directive? No - Patient declined No - Patient declined No - Patient declined - - No - Patient declined -  Pre-existing out of facility DNR order (yellow form or pink MOST form) - - - - - - -    Current Medications (verified) Outpatient Encounter Medications as of 04/09/2021  Medication Sig   acetaminophen (TYLENOL) 650 MG CR tablet Take 1,300 mg by mouth every 8 (eight) hours as needed for pain.   albuterol (PROVENTIL) (2.5 MG/3ML) 0.083% nebulizer solution Take 3 mLs (2.5 mg total) by nebulization every 6 (six) hours as needed for wheezing or shortness of breath.   albuterol (VENTOLIN HFA) 108 (90 Base) MCG/ACT inhaler Inhale 2 puffs into the lungs  every 6 (six) hours as needed for wheezing or shortness of breath.   amLODipine (NORVASC) 10 MG tablet TAKE 1 TABLET EVERY DAY   benzonatate (TESSALON) 100 MG capsule TAKE 1 CAPSULE(100 MG) BY MOUTH THREE TIMES DAILY AS NEEDED FOR COUGH   brimonidine (ALPHAGAN) 0.15 % ophthalmic solution Place 1 drop into both eyes 2 (two) times daily.   cetirizine (ZYRTEC) 10 MG tablet Take 10 mg by mouth daily.   citalopram (CELEXA) 20 MG tablet Take 1 tablet (20 mg total) by mouth daily. (Patient taking differently: Take 10 mg by mouth daily.)   clonazePAM (KLONOPIN) 0.5 MG tablet TAKE 1 TABLET(0.5 MG) BY MOUTH TWICE DAILY AS NEEDED FOR ANXIETY   clotrimazole (LOTRIMIN) 1 % cream Apply 1 application topically 2 (two) times daily as needed (FOR RASH). Prescribed for rash in groin   dextromethorphan-guaiFENesin (MUCINEX DM) 30-600 MG 12hr tablet Take 1 tablet by mouth 2 (two) times daily.   fluticasone (FLONASE) 50 MCG/ACT nasal spray USE 2 SPRAYS IN EACH NOSTRIL DAILY   furosemide (LASIX) 20 MG tablet Take 1 tablet (20 mg total) by mouth 2 (two) times a week. ON TUESDAYS AND SATURDAYS   gabapentin (NEURONTIN) 300 MG capsule Take 900-1,200 mg by mouth at bedtime.   hydrALAZINE (APRESOLINE) 10 MG tablet TAKE 1 TABLET THREE TIMES DAILY AS NEEDED FOR BLOOD PRESSURE MORE THAN 150/90 (Patient taking differently: Take 10 mg by mouth 3 (three) times daily.)  hydroxypropyl methylcellulose / hypromellose (ISOPTO TEARS / GONIOVISC) 2.5 % ophthalmic solution Place 1 drop into both eyes 2 (two) times daily.   Insulin Pen Needle 29G X 10MM MISC Use as directed   LORazepam (ATIVAN) 1 MG tablet Take 1 mg by mouth daily as needed for anxiety.   losartan (COZAAR) 100 MG tablet Take 1 tablet (100 mg total) by mouth daily.   meclizine (ANTIVERT) 25 MG tablet TAKE 1 TABLET TWICE DAILY AS NEEDED FOR DIZZINESS (Patient taking differently: Take 25 mg by mouth 2 (two) times daily.)   methocarbamol (ROBAXIN) 500 MG tablet Take 500 mg by  mouth 3 (three) times daily as needed for muscle spasms.   nitroGLYCERIN (NITROSTAT) 0.4 MG SL tablet PLACE 1 TABLET UNDER THE TONGUE EVERY 5 MINUTES AS NEEDED FOR CHEST PAIN. MAX 3 DOSES. CALL 911 IF NO IMPROVEMENT AFTER 1ST DOSE   omeprazole (PRILOSEC) 40 MG capsule TAKE 1 CAPSULE TWICE DAILY   potassium chloride (KLOR-CON) 10 MEQ tablet Take one tablet daily for 1 week then take one tablet on on tuesdays and saturdays.   primidone (MYSOLINE) 250 MG tablet TAKE 1 TABLET (250 MG TOTAL) BY MOUTH 4 (FOUR) TIMES DAILY. (Patient taking differently: Take 125 mg by mouth 3 (three) times daily.)   simvastatin (ZOCOR) 40 MG tablet TAKE 1 AND 1/2 TABLETS AT BEDTIME   tamsulosin (FLOMAX) 0.4 MG CAPS capsule Take 1 capsule (0.4 mg total) by mouth daily.   timolol (TIMOPTIC) 0.5 % ophthalmic solution Place 1 drop into both eyes 2 (two) times daily.    Tiotropium Bromide Monohydrate (SPIRIVA RESPIMAT) 2.5 MCG/ACT AERS Inhale 2.5 mcg into the lungs daily.   Travoprost, BAK Free, (TRAVATAN Z) 0.004 % SOLN ophthalmic solution Place 1 drop into both eyes at bedtime.   triamcinolone (KENALOG) 0.025 % cream Apply 1 application topically 2 (two) times daily as needed (rash).    vitamin B-12 (CYANOCOBALAMIN) 500 MCG tablet Take 500 mcg by mouth daily.   [DISCONTINUED] insulin aspart (NOVOLOG) 100 UNIT/ML FlexPen Inject 4 Units into the skin 3 (three) times daily with meals. (Patient not taking: Reported on 01/19/2020)   [DISCONTINUED] Insulin Detemir (LEVEMIR) 100 UNIT/ML Pen Inject 5 Units into the skin 2 (two) times daily. (Patient not taking: Reported on 01/19/2020)   [DISCONTINUED] ipratropium-albuterol (DUONEB) 0.5-2.5 (3) MG/3ML SOLN Take 3 mLs by nebulization every 6 (six) hours as needed. (Patient not taking: Reported on 01/19/2020)   No facility-administered encounter medications on file as of 04/09/2021.    Allergies (verified) Morphine and related, Sulfa antibiotics, Hydrochlorothiazide, Tape, Hytrin  [terazosin], Prednisone, Latex, Lisinopril, and Sulfur dioxide   History: Past Medical History:  Diagnosis Date   Acute bronchitis per pt no fever--  cough since discharge from hospital 07-02-2015   per pcp note 07-05-2015  mild to moderate bronchitis vs pna   Allergic rhinitis    Bladder cancer (HCC)    BPH (benign prostatic hyperplasia)    Bradycardia    CAD (coronary artery disease)    CHF (congestive heart failure) (HCC)    Chronic low back pain    COPD (chronic obstructive pulmonary disease) ([place])    DDD (degenerative disc disease), lumbosacral    Depression    Diverticulosis of colon (without mention of hemorrhage)    Dyspnea    Essential tremor    Fatty liver disease, nonalcoholic    GERD (gastroesophageal reflux disease)    Glaucoma    History of bladder cancer    2004--  TCC low-grade ,  non-invasive   History of colon polyps    History of exercise stress test    02-05-2007-- ETT (Clinically positive, electrically equivocal, submaximal ETT,  appropriate bp response to exercise   History of hiatal hernia    History of kidney stones    History of peptic ulcer    HLD (hyperlipidemia)    HTN (hypertension)    Nocturia    Pneumonia    Productive cough    Rheumatoid arthritis(714.0)    Right renal artery stenosis ([place])    per cath 03-14-2009 --- 40-50%   RLS (restless legs syndrome)    Stroke (HCC)    TIA   Vertigo, intermittent    Weak urinary stream    Wears dentures    upper   Wears glasses    Wears hearing aid    bilateral   Wears partial dentures    lower   Past Surgical History:  Procedure Laterality Date   CARDIAC CATHETERIZATION  02-12-2007  dr gregg taylor   Non-obstructive CAD,  20% pLCX,  20% LAD, ef 65%   CARDIAC CATHETERIZATION  03-14-2009  dr Johnsie Cancel   pLAD and mid diaginal 20-30% multiple lesions,  OM1 20%, right renal ostial 40-50%,  ef 60%   CATARACT EXTRACTION W/ INTRAOCULAR LENS  IMPLANT, BILATERAL  07/14/2014   CYSTOSCOPY WITH BIOPSY N/A  07/11/2015   Procedure: CYSTOSCOPY WITH COLD CUP RESECTION AND FULGERATION;  Surgeon: Rana Snare, MD;  Location: [facility];  Service: Urology;  Laterality: N/A;   HIP ARTHROSCOPY W/ LABRAL DEBRIDEMENT Left 06/29/2000   and chondraplasty   JOINT REPLACEMENT     right big toe replacement   KYPHOPLASTY N/A 03/28/2021   Procedure: KYPHOPLASTY lumbar one;  Surgeon: Melina Schools, MD;  Location: [place];  Service: Orthopedics;  Laterality: N/A;   LUMBAR [place] SURGERY  07/16/2005   LUMBAR LAMINECTOMY/DECOMPRESSION MICRODISCECTOMY  07/09/2011   Procedure: LUMBAR LAMINECTOMY/DECOMPRESSION MICRODISCECTOMY;  Surgeon: Johnn Hai;  Location: [facility];  Service: Orthopedics;  Laterality: Left;  Decompression L5 - S1 on the Left and repair of dura   LUMBAR LAMINECTOMY/DECOMPRESSION MICRODISCECTOMY N/A 07/27/2013   Procedure: DECOMPRESSION L4 - L5 WITH EXCISION OF SYNOVIAL CYST AND, LATERAL MASS FUSION 1 LEVEL;  Surgeon: Johnn Hai, MD;  Location: [facility];  Service: Orthopedics;  Laterality: N/A;   LUMBAR LAMINECTOMY/DECOMPRESSION MICRODISCECTOMY Left 07/06/2014   Procedure:  MICRO LUMBAR DECOMPRESSION L5-S1 ON LEFT, L4-5 REDO;  Surgeon: Johnn Hai, MD;  Location: [facility];  Service: Orthopedics;  Laterality: Left;   SPINAL CORD STIMULATOR BATTERY EXCHANGE N/A 03/28/2021   Procedure: SPINAL CORD STIMULATOR BATTERY EXCHANGE/REPLACEMENT, L1 KYPHOPLASTY;  Surgeon: Melina Schools, MD;  Location: [place];  Service: Orthopedics;  Laterality: N/A;  90 MINS LOCAL WITH IV REG   SPINAL CORD STIMULATOR INSERTION N/A 09/11/2016   Procedure: Spinal cord stimulator placement;  Surgeon: Melina Schools, MD;  Location: [place];  Service: Orthopedics;  Laterality: N/A;   TONSILLECTOMY AND ADENOIDECTOMY     TRANSTHORACIC ECHOCARDIOGRAM  09/20/2010   normal LVF, ef 55-65%/  mild MR, TR and PR/  mild LAE   TRANSURETHRAL RESECTION OF BLADDER TUMOR  09/21/2002   Family History  Problem Relation Age of Onset    Heart attack Mother    Cancer Father        lung   Cancer Sister        lung   Diabetes Sister    Cancer Brother        lung that  spread to brain   Social History   Socioeconomic History   Marital status: Widowed    Spouse name: Not on file   Number of children: 2   Years of education: Not on file   Highest education level: Not on file  Occupational History   Occupation: retired from [employer]: RETIRED  Tobacco Use   Smoking status: Former    Packs/day: 2.00    Years: 20.00    Pack years: 40.00    Types: Cigarettes    Quit date: 10/08/1982    Years since quitting: 38.5   Smokeless tobacco: Never  Vaping Use   Vaping Use: Never used  Substance and Sexual Activity   Alcohol use: Not Currently    Comment: rare wine or beer occassionally   Drug use: No   Sexual activity: Never  Other Topics Concern   Not on file  Social History Narrative   Not on file   Social Determinants of Health   Financial Resource Strain: Not on file  Food Insecurity: No Food Insecurity   Worried About [place] in the Last Year: Never true   [place] in the Last Year: Never true  Transportation Needs: No Transportation Needs   Lack of Transportation (Medical): No   Lack of Transportation (Non-Medical): No  Physical Activity: Not on file  Stress: Not on file  Social Connections: Not on file    Tobacco Counseling Counseling given: Not Answered   Clinical Intake:           BMI - recorded: 34.62 Nutritional Status: BMI > 30  Obese Diabetes: No  How often do you need to have someone help you when you read instructions, pamphlets, or other written materials from your doctor or pharmacy?: 4 - Often  Diabetic? No  Interpreter Needed?: No      Activities of Daily Living In your present state of health, do you have any difficulty performing the following activities: 04/09/2021 03/21/2021  Hearing? N N  Vision? N N  Difficulty concentrating or  making decisions? Y N  Comment Family feels he has some signs of dementia -  Walking or climbing stairs? N Y  Dressing or bathing? N N  Doing errands, shopping? Y N  Preparing Food and eating ? N -  Using the Toilet? N -  In the past six months, have you accidently leaked urine? N -  Do you have problems with loss of bowel control? N -  Managing your Medications? Y -  Managing your Finances? Y -  Housekeeping or managing your Housekeeping? N -  Some recent data might be hidden    Patient Care Team: Biagio Borg, MD as PCP - General Berniece Salines, DO as PCP - Cardiology (Cardiology) Shon Hough, MD as Consulting Physician (Ophthalmology) Knox Royalty, RN as Case Manager  Indicate any recent Medical Services you may have received from other than [facility] providers in the past year (date may be approximate).     Assessment:   This is a routine wellness examination for [place].  Hearing/Vision screen No results found.  Dietary issues and exercise activities discussed: Current Exercise Habits: Home exercise routine, Type of exercise: stretching;walking, Time (Minutes): 15, Frequency (Times/Week): 3, Weekly Exercise (Minutes/Week): 45, Intensity: Mild, Exercise limited by: None identified   Goals Addressed   None    Depression Screen PHQ 2/9 Scores 04/09/2021 10/24/2020 01/23/2020 01/02/2020 10/03/2019 12/22/2018 12/14/2017  PHQ - 2 Score 0  0 0 0 0 1 1  PHQ- 9 Score - - - - - 2 3    Fall Risk Fall Risk  04/09/2021 03/22/2021 10/24/2020 01/23/2020 01/02/2020  Falls in the past year? 0 0 0 1 1  Comment - - - - -  Number falls in past yr: 0 0 - 1 0  Injury with Fall? 0 0 - 1 0  Comment - N/A- no falls over last year reported - now has rolling walker with seat -  Risk Factor Category  - - - - -  Risk for fall due to : - Impaired mobility;Medication side effect;History of fall(s) - - Impaired balance/gait  Follow up - Falls prevention discussed - - Falls evaluation completed;Education  provided    FALL RISK PREVENTION PERTAINING TO THE HOME:  Any stairs in or around the home? Yes  If so, are there any without handrails? No  Home free of loose throw rugs in walkways, pet beds, electrical cords, etc? Yes  Adequate lighting in your home to reduce risk of falls? Yes   ASSISTIVE DEVICES UTILIZED TO PREVENT FALLS:  Life alert? Yes  Use of a cane, walker or w/c? Yes  Grab bars in the bathroom? Yes  Shower chair or bench in shower? Yes  Elevated toilet seat or a handicapped toilet? Yes   TIMED UP AND GO:  Was the test performed?  n/a .  Length of time to ambulate 10 feet: n/a sec.     Cognitive Function: MMSE - Mini Mental State Exam 12/14/2017  Orientation to time 5  Orientation to Place 5  Registration 3  Attention/ Calculation 3  Recall 3  Language- name 2 objects 2  Language- repeat 1  Language- follow 3 step command 3  Language- read & follow direction 1  Write a sentence 1  Copy design 1  Total score 28        Immunizations Immunization History  Administered Date(s) Administered   Fluad Quad(high Dose 65+) 04/25/2020   Influenza Split 03/14/2012   Influenza Whole 05/17/2007, 04/19/2013   Influenza, High Dose Seasonal PF 04/14/2017, 04/20/2018   Influenza,inj,Quad PF,6+ Mos 04/05/2014, 06/04/2016   Influenza,inj,quad, With Preservative 04/12/2018   Influenza-Unspecified 05/14/2015   Pneumococcal Conjugate-13 05/16/2014   Pneumococcal Polysaccharide-23 05/17/2007   Tdap 11/28/2010   Unspecified SARS-COV-2 Vaccination 09/12/2019, 10/10/2019   Zoster, Live 10/25/2013    TDAP status: Up to date  Flu Vaccine status: Due, Education has been provided regarding the importance of this vaccine. Advised may receive this vaccine at local pharmacy or Health Dept. Aware to provide a copy of the vaccination record if obtained from local pharmacy or Health Dept. Verbalized acceptance and understanding.  Pneumococcal vaccine status: Up to date  Covid-19  vaccine status: Completed vaccines  Qualifies for Shingles Vaccine? Yes   Zostavax completed Yes   Shingrix Completed?: Yes  Screening Tests Health Maintenance  Topic Date Due   INFLUENZA VACCINE  02/11/2021   TETANUS/TDAP  10/19/2021   COVID-19 Vaccine  Completed   Zoster Vaccines- Shingrix  Completed   HPV VACCINES  Aged Out    Health Maintenance  Health Maintenance Due  Topic Date Due   INFLUENZA VACCINE  02/11/2021    Colorectal cancer screening: No longer required.   Lung Cancer Screening: (Low Dose CT Chest recommended if Age 72-80 years, 30 pack-year currently smoking OR have quit w/in 15years.) does not qualify.    Additional Screening:  Hepatitis C Screening: does not qualify  Vision Screening:  Recommended annual ophthalmology exams for early detection of glaucoma and other disorders of the eye. Is the patient up to date with their annual eye exam?  Yes  Who is the provider or what is the name of the office in which the patient attends annual eye exams? N/A If pt is not established with a provider, would they like to be referred to a provider to establish care? No .   Dental Screening: Recommended annual dental exams for proper oral hygiene  Community Resource Referral / Chronic Care Management: CRR required this visit?  No   CCM required this visit?  No      Plan:     I have personally reviewed and noted the following in the patient's chart:   Medical and social history Use of alcohol, tobacco or illicit drugs  Current medications and supplements including opioid prescriptions. Patient is not currently taking opioid prescriptions. Functional ability and status Nutritional status Physical activity Advanced directives List of other physicians Hospitalizations, surgeries, and ER visits in previous 12 months Vitals Screenings to include cognitive, depression, and falls Referrals and appointments  In addition, I have reviewed and discussed with  patient certain preventive protocols, quality metrics, and best practice recommendations. A written personalized care plan for preventive services as well as general preventive health recommendations were provided to patient.     Cannon Kettle, [place]   04/09/2021   Nurse Notes: 11 minutes non face to face   Mr. Mcanany , Thank you for taking time to come for your Medicare Wellness Visit. I appreciate your ongoing commitment to your health goals. Please review the following plan we discussed and let me know if I can assist you in the future.   These are the goals we discussed:  Goals      Patient Self- Care Activities     Timeframe:  Long-Range Goal Priority:  Medium Start Date:        03/22/21                     Expected End Date:      03/22/22                 Patient will attend all scheduled provider appointments Patient will call pharmacy for medication refills Patient will call provider office for new concerns or questions Patient will continue to take medications from pill box after they have been prepared by caregiver Patient will continue to follow heart healthy, low salt diet Patient will continue to monitor and write down on paper daily blood pressures at home     Stay as socially active as possible, continue to exercise, and eat healthy        This is a list of the screening recommended for you and due dates:  Health Maintenance  Topic Date Due   Flu Shot  02/11/2021   Tetanus Vaccine  10/19/2021   COVID-19 Vaccine  Completed   Zoster (Shingles) Vaccine  Completed   HPV Vaccine  Aged Out     Medical screening examination/treatment/procedure(s) were performed by non-physician practitioner and as supervising physician I was immediately available for consultation/collaboration.  I agree with above. Cathlean Cower, MD

## 2021-04-09 NOTE — Patient Instructions (Signed)
Health Maintenance, Male Adopting a healthy lifestyle and getting preventive care are important in promoting health and wellness. Ask your health care provider about: The right schedule for you to have regular tests and exams. Things you can do on your own to prevent diseases and keep yourself healthy. What should I know about diet, weight, and exercise? Eat a healthy diet  Eat a diet that includes plenty of vegetables, fruits, low-fat dairy products, and lean protein. Do not eat a lot of foods that are high in solid fats, added sugars, or sodium. Maintain a healthy weight Body mass index (BMI) is a measurement that can be used to identify possible weight problems. It estimates body fat based on height and weight. Your health care provider can help determine your BMI and help you achieve or maintain a healthy weight. Get regular exercise Get regular exercise. This is one of the most important things you can do for your health. Most adults should: Exercise for at least 150 minutes each week. The exercise should increase your heart rate and make you sweat (moderate-intensity exercise). Do strengthening exercises at least twice a week. This is in addition to the moderate-intensity exercise. Spend less time sitting. Even light physical activity can be beneficial. Watch cholesterol and blood lipids Have your blood tested for lipids and cholesterol at 83 years of age, then have this test every 5 years. You may need to have your cholesterol levels checked more often if: Your lipid or cholesterol levels are high. You are older than 83 years of age. You are at high risk for heart disease. What should I know about cancer screening? Many types of cancers can be detected early and may often be prevented. Depending on your health history and family history, you may need to have cancer screening at various ages. This may include screening for: Colorectal cancer. Prostate cancer. Skin cancer. Lung  cancer. What should I know about heart disease, diabetes, and high blood pressure? Blood pressure and heart disease High blood pressure causes heart disease and increases the risk of stroke. This is more likely to develop in people who have high blood pressure readings, are of African descent, or are overweight. Talk with your health care provider about your target blood pressure readings. Have your blood pressure checked: Every 3-5 years if you are 18-39 years of age. Every year if you are 40 years old or older. If you are between the ages of 65 and 75 and are a current or former smoker, ask your health care provider if you should have a one-time screening for abdominal aortic aneurysm (AAA). Diabetes Have regular diabetes screenings. This checks your fasting blood sugar level. Have the screening done: Once every three years after age 45 if you are at a normal weight and have a low risk for diabetes. More often and at a younger age if you are overweight or have a high risk for diabetes. What should I know about preventing infection? Hepatitis B If you have a higher risk for hepatitis B, you should be screened for this virus. Talk with your health care provider to find out if you are at risk for hepatitis B infection. Hepatitis C Blood testing is recommended for: Everyone born from 1945 through 1965. Anyone with known risk factors for hepatitis C. Sexually transmitted infections (STIs) You should be screened each year for STIs, including gonorrhea and chlamydia, if: You are sexually active and are younger than 83 years of age. You are older than 83 years   of age and your health care provider tells you that you are at risk for this type of infection. Your sexual activity has changed since you were last screened, and you are at increased risk for chlamydia or gonorrhea. Ask your health care provider if you are at risk. Ask your health care provider about whether you are at high risk for HIV.  Your health care provider may recommend a prescription medicine to help prevent HIV infection. If you choose to take medicine to prevent HIV, you should first get tested for HIV. You should then be tested every 3 months for as long as you are taking the medicine. Follow these instructions at home: Lifestyle Do not use any products that contain nicotine or tobacco, such as cigarettes, e-cigarettes, and chewing tobacco. If you need help quitting, ask your health care provider. Do not use street drugs. Do not share needles. Ask your health care provider for help if you need support or information about quitting drugs. Alcohol use Do not drink alcohol if your health care provider tells you not to drink. If you drink alcohol: Limit how much you have to 0-2 drinks a day. Be aware of how much alcohol is in your drink. In the [place] one drink equals one 12 oz bottle of beer (355 mL), one 5 oz glass of wine (148 mL), or one 1 oz glass of hard liquor (44 mL). General instructions Schedule regular health, dental, and eye exams. Stay current with your vaccines. Tell your health care provider if: You often feel depressed. You have ever been abused or do not feel safe at home. Summary Adopting a healthy lifestyle and getting preventive care are important in promoting health and wellness. Follow your health care provider's instructions about healthy diet, exercising, and getting tested or screened for diseases. Follow your health care provider's instructions on monitoring your cholesterol and blood pressure. This information is not intended to replace advice given to you by your health care provider. Make sure you discuss any questions you have with your health care provider. Document Revised: 09/07/2020 Document Reviewed: 06/23/2018 Elsevier Patient Education  2022 [employer]  

## 2021-04-10 ENCOUNTER — Ambulatory Visit: Payer: Medicare Other | Admitting: Cardiology

## 2021-04-11 ENCOUNTER — Encounter: Payer: Self-pay | Admitting: *Deleted

## 2021-04-11 ENCOUNTER — Ambulatory Visit: Payer: Medicare Other | Admitting: *Deleted

## 2021-04-11 DIAGNOSIS — I10 Essential (primary) hypertension: Secondary | ICD-10-CM

## 2021-04-11 DIAGNOSIS — G894 Chronic pain syndrome: Secondary | ICD-10-CM

## 2021-04-11 DIAGNOSIS — I509 Heart failure, unspecified: Secondary | ICD-10-CM

## 2021-04-11 NOTE — Patient Instructions (Signed)
Visit Information  [name], it was nice talking with you today about [name]'s healthcare needs.    I look forward to talking to you again for an update on Wednesday July 10, 2021 at 3:30 pm- please be listening out for my call that day.  I will call as close to 3:30 pm as possible.   If you need to cancel or re-schedule our telephone visit, please call [phone] and one of our care guides will be happy to assist you.   I look forward to hearing about your progress.   Please don't hesitate to contact me if I can be of assistance to you before our next scheduled telephone appointment.   Oneta Rack, RN, BSN, [facility] RN Care Coordination- [place] [phone]: direct office [phone]: mobile   PATIENT GOALS:  Goals Addressed             This Visit's Progress    Patient Self- Care Activities   On track    Timeframe:  Long-Range Goal Priority:  Medium Start Date:        03/22/21                     Expected End Date:      03/22/22                 Patient will attend all scheduled provider appointments Patient will call pharmacy for medication refills Patient will call provider office for new concerns or questions Patient will continue to take medications from pill box after they have been prepared by caregiver Patient will continue to follow heart healthy, low salt diet Patient will continue to monitor and write down on paper daily blood pressures at home Patient will continue to monitor and write down on paper daily weights: the weight you reported today was "200 lbs"       Heart Failure Action Plan A heart failure action plan helps you understand what to do when you have symptoms of heart failure. Your action plan is a color-coded plan that lists the symptoms to watch for and indicates what actions to take. If you have symptoms in the red zone, you need medical care right away. If you have symptoms in the yellow zone, you are  having problems. If you have symptoms in the green zone, you are doing well. Follow the plan that was created by you and your health care provider. Review your plan each time you visit your health care provider. Red zone These signs and symptoms mean you should get medical help right away: You have trouble breathing when resting. You have a dry cough that is getting worse. You have swelling or pain in your legs or abdomen that is getting worse. You suddenly gain more than 2-3 lb (0.9-1.4 kg) in 24 hours, or more than 5 lb (2.3 kg) in a week. This amount may be more or less depending on your condition. You have trouble staying awake or you feel confused. You have chest pain. You do not have an appetite. You pass out. You have worsening sadness or depression. If you have any of these symptoms, call your local emergency services (911 in the [place] right away. Do not drive yourself to the hospital. Yellow zone These signs and symptoms mean your condition may be getting worse and you should make some changes: You have trouble breathing when you are active, or you need to sleep with your head raised on  extra pillows to help you breathe. You have swelling in your legs or abdomen. You gain 2-3 lb (0.9-1.4 kg) in 24 hours, or 5 lb (2.3 kg) in a week. This amount may be more or less depending on your condition. You get tired easily. You have trouble sleeping. You have a dry cough. If you have any of these symptoms: Contact your health care provider within the next day. Your health care provider may adjust your medicines. Green zone These signs mean you are doing well and can continue what you are doing: You do not have shortness of breath. You have very little swelling or no new swelling. Your weight is stable (no gain or loss). You have a normal activity level. You do not have chest pain or any other new symptoms. Follow these instructions at home: Take over-the-counter and prescription  medicines only as told by your health care provider. Weigh yourself daily. Your target weight is __________ lb (__________ kg). Call your health care provider if you gain more than __________ lb (__________ kg) in 24 hours, or more than __________ lb (__________ kg) in a week. Health care provider name: _____________________________________________________ Health care provider phone number: _____________________________________________________ Eat a heart-healthy diet. Work with a diet and nutrition specialist (dietitian) to create an eating plan that is best for you. Keep all follow-up visits. This is important. Where to find more information American Heart Association: www.heart.org Summary A heart failure action plan helps you understand what to do when you have symptoms of heart failure. Follow the action plan that was created by you and your health care provider. Get help right away if you have any symptoms in the red zone. This information is not intended to replace advice given to you by your health care provider. Make sure you discuss any questions you have with your health care provider. Document Revised: 02/13/2020 Document Reviewed: 02/13/2020 Elsevier Patient Education  2022 [name]  Patient's caregiver verbalizes understanding of instructions provided today and agrees to view in MyChart  Telephone follow up appointment with care management team member scheduled for:  Wednesday July 10, 2021 at 3:30 pm The patient's caregiver has been provided with contact information for the care management team and has been advised to call with any health related questions or concerns  Oneta Rack, RN, BSN, [place] [phone]: direct office [phone]: mobile

## 2021-04-11 NOTE — Chronic Care Management (AMB) (Signed)
Chronic Care Management   CCM RN Visit Note  04/11/2021 Name: [name] MRN: [id number] DOB: 1935/12/13  Subjective: [name] is a 85 y.o. year old male who is a primary care patient of Biagio Borg, MD. The care management team was consulted for assistance with disease management and care coordination needs.    Engaged with patient by telephone for follow up visit in response to provider referral for case management and/or care coordination services.   Consent to Services:  The patient was given information about Chronic Care Management services, agreed to services, and gave verbal consent prior to initiation of services.  Please see initial visit note for detailed documentation.  Patient agreed to services and verbal consent obtained.   Assessment: Review of patient past medical history, allergies, medications, health status, including review of consultants reports, laboratory and other test data, was performed as part of comprehensive evaluation and provision of chronic care management services.   CCM Care Plan Allergies  Allergen Reactions   Morphine And Related Other (See Comments)    Migraine    Sulfa Antibiotics Rash and Itching    Patient  Is ok to use paper tape   Hydrochlorothiazide Other (See Comments)    REACTION: hyponatremia with daily use   Tape Itching    Patient  Is ok to use paper tape   Hytrin [Terazosin] Other (See Comments)    Dizziness   Prednisone Other (See Comments)    Caused problem with blood sugar   Latex Rash   Lisinopril Cough   Sulfur Dioxide Itching and Rash   Outpatient Encounter Medications as of 04/11/2021  Medication Sig   acetaminophen (TYLENOL) 650 MG CR tablet Take 1,300 mg by mouth every 8 (eight) hours as needed for pain.   albuterol (PROVENTIL) (2.5 MG/3ML) 0.083% nebulizer solution Take 3 mLs (2.5 mg total) by nebulization every 6 (six) hours as needed for wheezing or shortness of breath.   albuterol (VENTOLIN HFA) 108 (90 Base)  MCG/ACT inhaler Inhale 2 puffs into the lungs every 6 (six) hours as needed for wheezing or shortness of breath.   amLODipine (NORVASC) 10 MG tablet TAKE 1 TABLET EVERY DAY   benzonatate (TESSALON) 100 MG capsule TAKE 1 CAPSULE(100 MG) BY MOUTH THREE TIMES DAILY AS NEEDED FOR COUGH   brimonidine (ALPHAGAN) 0.15 % ophthalmic solution Place 1 drop into both eyes 2 (two) times daily.   cetirizine (ZYRTEC) 10 MG tablet Take 10 mg by mouth daily.   citalopram (CELEXA) 20 MG tablet Take 1 tablet (20 mg total) by mouth daily. (Patient taking differently: Take 10 mg by mouth daily.)   clonazePAM (KLONOPIN) 0.5 MG tablet TAKE 1 TABLET(0.5 MG) BY MOUTH TWICE DAILY AS NEEDED FOR ANXIETY   clotrimazole (LOTRIMIN) 1 % cream Apply 1 application topically 2 (two) times daily as needed (FOR RASH). Prescribed for rash in groin   dextromethorphan-guaiFENesin (MUCINEX DM) 30-600 MG 12hr tablet Take 1 tablet by mouth 2 (two) times daily.   fluticasone (FLONASE) 50 MCG/ACT nasal spray USE 2 SPRAYS IN EACH NOSTRIL DAILY   furosemide (LASIX) 20 MG tablet Take 1 tablet (20 mg total) by mouth 2 (two) times a week. ON TUESDAYS AND SATURDAYS   gabapentin (NEURONTIN) 300 MG capsule Take 900-1,200 mg by mouth at bedtime.   hydrALAZINE (APRESOLINE) 10 MG tablet TAKE 1 TABLET THREE TIMES DAILY AS NEEDED FOR BLOOD PRESSURE MORE THAN 150/90 (Patient taking differently: Take 10 mg by mouth 3 (three) times daily.)  hydroxypropyl methylcellulose / hypromellose (ISOPTO TEARS / GONIOVISC) 2.5 % ophthalmic solution Place 1 drop into both eyes 2 (two) times daily.   Insulin Pen Needle 29G X 10MM MISC Use as directed   LORazepam (ATIVAN) 1 MG tablet Take 1 mg by mouth daily as needed for anxiety.   losartan (COZAAR) 100 MG tablet Take 1 tablet (100 mg total) by mouth daily.   meclizine (ANTIVERT) 25 MG tablet TAKE 1 TABLET TWICE DAILY AS NEEDED FOR DIZZINESS (Patient taking differently: Take 25 mg by mouth 2 (two) times daily.)    methocarbamol (ROBAXIN) 500 MG tablet Take 500 mg by mouth 3 (three) times daily as needed for muscle spasms.   nitroGLYCERIN (NITROSTAT) 0.4 MG SL tablet PLACE 1 TABLET UNDER THE TONGUE EVERY 5 MINUTES AS NEEDED FOR CHEST PAIN. MAX 3 DOSES. CALL 911 IF NO IMPROVEMENT AFTER 1ST DOSE   omeprazole (PRILOSEC) 40 MG capsule TAKE 1 CAPSULE TWICE DAILY   potassium chloride (KLOR-CON) 10 MEQ tablet Take one tablet daily for 1 week then take one tablet on on tuesdays and saturdays.   primidone (MYSOLINE) 250 MG tablet TAKE 1 TABLET (250 MG TOTAL) BY MOUTH 4 (FOUR) TIMES DAILY. (Patient taking differently: Take 125 mg by mouth 3 (three) times daily.)   simvastatin (ZOCOR) 40 MG tablet TAKE 1 AND 1/2 TABLETS AT BEDTIME   tamsulosin (FLOMAX) 0.4 MG CAPS capsule Take 1 capsule (0.4 mg total) by mouth daily.   timolol (TIMOPTIC) 0.5 % ophthalmic solution Place 1 drop into both eyes 2 (two) times daily.    Tiotropium Bromide Monohydrate (SPIRIVA RESPIMAT) 2.5 MCG/ACT AERS Inhale 2.5 mcg into the lungs daily.   Travoprost, BAK Free, (TRAVATAN Z) 0.004 % SOLN ophthalmic solution Place 1 drop into both eyes at bedtime.   triamcinolone (KENALOG) 0.025 % cream Apply 1 application topically 2 (two) times daily as needed (rash).    vitamin B-12 (CYANOCOBALAMIN) 500 MCG tablet Take 500 mcg by mouth daily.   [DISCONTINUED] insulin aspart (NOVOLOG) 100 UNIT/ML FlexPen Inject 4 Units into the skin 3 (three) times daily with meals. (Patient not taking: Reported on 01/19/2020)   [DISCONTINUED] Insulin Detemir (LEVEMIR) 100 UNIT/ML Pen Inject 5 Units into the skin 2 (two) times daily. (Patient not taking: Reported on 01/19/2020)   [DISCONTINUED] ipratropium-albuterol (DUONEB) 0.5-2.5 (3) MG/3ML SOLN Take 3 mLs by nebulization every 6 (six) hours as needed. (Patient not taking: Reported on 01/19/2020)   No facility-administered encounter medications on file as of 04/11/2021.   Patient Active Problem List   Diagnosis Date Noted    Preop exam for internal medicine 03/07/2021   Other abnormalities of gait and mobility 02/04/2021   Cataract 02/04/2021   Wears partial dentures    Wears glasses    Wears hearing aid    Wears dentures    Weak urinary stream    Vertigo, intermittent    RLS (restless legs syndrome)    Productive cough    Nocturia    History of peptic ulcer    History of kidney stones    History of hiatal hernia    History of exercise stress test    History of colon polyps    History of bladder cancer    Fatty liver disease, nonalcoholic    Glaucoma    DDD (degenerative disc disease), lumbosacral    Chronic low back pain    BPH (benign prostatic hyperplasia)    Acute bronchitis    Aortic atherosclerosis ([place]) 04/25/2020   AKI (acute kidney  injury) ([place]) 01/23/2020   Chest pain of uncertain etiology 12/87/8676   Essential hypertension 11/12/2019   Dizziness 11/12/2019   Obesity (BMI 30-39.9) 11/12/2019   History of COVID-19 10/03/2019   Acute respiratory failure with hypoxia ([place]) 10/03/2019   Pneumonia due to COVID-19 virus 08/16/2019   Acute respiratory failure due to COVID-19 ([place]) 08/16/2019   COVID-19 virus infection 08/09/2019   Memory loss 06/24/2019   Wheezing 06/24/2019   Abrasion of left arm 04/11/2019   Joint swelling of lower leg 11/29/2018   Abnormal urinalysis 02/25/2018   Anxiety 01/08/2018   Laceration of hand 12/18/2017   Pain of left hand 12/18/2017   Laceration of scalp without foreign body 06/09/2017   Nonintractable headache 06/09/2017   Acute sinus infection 04/14/2017   Nausea and vomiting 03/27/2017   Hyponatremia 03/17/2017   Right lumbar radiculopathy 01/21/2017   Fall 01/21/2017   Synovial cyst 01/21/2017   Chronic pain 09/11/2016   Generalized weakness 05/22/2016   UTI (urinary tract infection) 05/22/2016   Weakness 05/22/2016   Fever blister 10/18/2015   Dyspnea 10/17/2015   Bradycardia 09/17/2015   Chest pain at rest 09/17/2015   CAD in native  artery 09/17/2015   CHF (congestive heart failure) ([place]) 09/17/2015   Pulmonary hypertension ([place]) 09/17/2015   Bladder cancer ([place]) 09/17/2015   Essential tremor 09/17/2015   Chest pain 09/17/2015   Anginal chest pain at rest [facility]) 09/17/2015   Hypokalemia 07/13/2015   Vertigo 07/01/2015   Dizzinesses 07/01/2015   Spinal stenosis of lumbar region at multiple levels 07/06/2014   Angular cheilitis 05/05/2014   Left lumbar radiculopathy 05/04/2014   Right elbow pain 12/29/2013   Lateral epicondylitis of right elbow 12/29/2013   Cough 09/14/2013   Spinal stenosis, lumbar 07/27/2013   Palpable mass of neck 07/21/2013   Vasovagal episode 01/10/2013   Left otitis media 10/28/2012   Impaired glucose tolerance 10/28/2012   Cellulitis 04/24/2012   Hemorrhoids 04/24/2012   Syncope 04/21/2012   Depression 09/12/2010   RENAL ARTERY STENOSIS 09/12/2010   FATIGUE 09/12/2010   BUNION, RIGHT FOOT 02/05/2010   RECTAL BLEEDING 07/10/2009   Diarrhea 07/10/2009   DIVERTICULOSIS OF COLON 03/28/2009   FATTY LIVER DISEASE 03/28/2009   BRADYCARDIA 03/20/2009   DIZZINESS 10/17/2008   CHEST PAIN 10/17/2008   Malignant neoplasm of bladder ([place]) 02/07/2008   Allergic rhinitis 02/07/2008   COLONIC POLYPS, HX OF 02/07/2008   Hyperlipidemia 11/24/2007   GERD 11/24/2007   HIATAL HERNIA WITH REFLUX 11/24/2007   CKD (chronic kidney disease) stage 3, GFR 30-59 ml/min (HCC) 11/24/2007   Rheumatoid arthritis ([place]) 11/24/2007   [place] DISEASE, LUMBAR 11/24/2007   ADENOIDECTOMY, HX OF 11/24/2007   Conditions to be addressed/monitored:  CHF, HTN, and chronic pain  Care Plan : RN Care Manager Plan of Care  Updates made by Knox Royalty, RN since 04/11/2021 12:00 AM     Problem: Chronic Disease Management Needs   Priority: Medium     Long-Range Goal: Development of plan of care for long term chronic disease management   Start Date: 03/22/2021  Expected End Date: 03/22/2022  Priority: Medium  Note:    Current Barriers:  Chronic Disease Management support and education needs related to HTN and chronic pain  RNCM Clinical Goal(s):  Patient will demonstrate ongoing health management independence HTN; chronic pain  through collaboration with RN Care manager, provider, and care team.   Interventions: 1:1 collaboration with primary care provider regarding development and update of comprehensive plan of care  as evidenced by provider attestation and co-signature Inter-disciplinary care team collaboration (see longitudinal plan of care) Evaluation of current treatment plan related to  self management and patient's adherence to plan as established by provider  Hypertension: (Status: Goal on track: YES.) Last practice recorded BP readings:  BP Readings from Last 3 Encounters:  03/21/21 139/67  03/07/21 118/60  10/24/20 124/80  Most recent eGFR/CrCl: No results found for: EGFR  No components found for: CRCL  Evaluation of current treatment plan related to hypertension self management and patient's adherence to plan as established by provider;   Discussed plans with patient for ongoing care management follow up and provided patient with direct contact information for care management team; Advised patient, providing education and rationale, to monitor blood pressure daily and record, calling PCP for findings outside established parameters;  Discussed complications of poorly controlled blood pressure such as heart disease, stroke, circulatory complications, vision complications, kidney impairment, sexual dysfunction;  Reviewed recent blood pressures at home with caregiver: reports consistent blood pressures between 120-140/ 80-90; confirms patient able to self-monitor blood pressures at home Confirmed no medication concerns: caregivers continue to prepare medications in pill box, patient then takes independently Confirmed patient has begun to monitor and write down on paper daily weights at home;  caregiver currently reports consistent weights at home between 200-203 lbs; denies significant day-day fluctuation Reiterated previously provided education around purpose of daily weights in setting of concurrent CHF; confirmed no clinical concerns with breathing status or peripheral swelling Confirmed patient continues to follow heart healthy, low salt diet Upcoming provider appointments: 04/15/21-- pain management follow up from recent nerve stimulator battery replacement and vertebral cementing; 04/15/21- annual eye exam; 05/01/21- cardiology provider; 05/13/21- podiatry: confirmed caregiver will provide transportation  Pain:  (Status: Goal on track: YES.) Pain assessment performed- caregiver reports patient in constant but well-controlled pain with current interventions/ medications Medications reviewed Reviewed provider established plan for pain management; Discussed importance of adherence to all scheduled medical appointments; Counseled on the importance of reporting any/all new or changed pain symptoms or management strategies to pain management provider; Advised patient to report to care team affect of pain on daily activities; Reviewed with patient prescribed pharmacological and nonpharmacological pain relief strategies; Confirmed patient attended procedure for replacement of battery in nerve stimulator 03/28/21- caregiver reports patient tolerated well and state that "he seems to be getting around fine and to be walking better" since procedure: confirms he will provide transportation for/ attend with upcoming post-procedure follow up visit  Patient Goals/Self-Care Activities: Patient will attend all scheduled provider appointments Patient will call pharmacy for medication refills Patient will call provider office for new concerns or questions Patient will continue to take medications from pill box after they have been prepared by caregiver Patient will continue to follow heart healthy,  low salt diet Patient will continue to monitor and write down on paper daily blood pressures at home Patient will continue to monitor and write down on paper daily weights: the weight you reported today was "200 lbs"    Plan: Telephone follow up appointment with care management team member scheduled for:  Wednesday July 10, 2021 at 3:30 pm The patient has been provided with contact information for the care management team and has been advised to call with any health related questions or concerns.   Oneta Rack, RN, BSN, [facility] RN Care Coordination- [place] [phone]: direct office [phone]: mobile

## 2021-04-12 DIAGNOSIS — I10 Essential (primary) hypertension: Secondary | ICD-10-CM

## 2021-04-12 DIAGNOSIS — I509 Heart failure, unspecified: Secondary | ICD-10-CM

## 2021-04-15 DIAGNOSIS — H401133 Primary open-angle glaucoma, bilateral, severe stage: Secondary | ICD-10-CM | POA: Diagnosis not present

## 2021-04-15 DIAGNOSIS — H52203 Unspecified astigmatism, bilateral: Secondary | ICD-10-CM | POA: Diagnosis not present

## 2021-04-15 DIAGNOSIS — H04123 Dry eye syndrome of bilateral lacrimal glands: Secondary | ICD-10-CM | POA: Diagnosis not present

## 2021-04-15 DIAGNOSIS — H26491 Other secondary cataract, right eye: Secondary | ICD-10-CM | POA: Diagnosis not present

## 2021-04-29 IMAGING — CT CT L SPINE W/O CM
1 of 6 series · 7 of 14 positions shown, 9 images · non-contrast
Comparison: CT lumbar spine 01/03/2020

CLINICAL DATA: Low back pain. Follow-up L1 fracture. History of
bladder cancer.

EXAM:
CT LUMBAR SPINE WITHOUT CONTRAST
TECHNIQUE: Multidetector CT imaging of the lumbar spine was performed without
intravenous contrast administration. Multiplanar CT image
reconstructions were also generated.

[Series 3: l spine soft · axial · 0.38mm/px · z∈[-227,-47]mm · 7 of 121 slices shown, 9 images]
[im 16/121  soft-tissue]
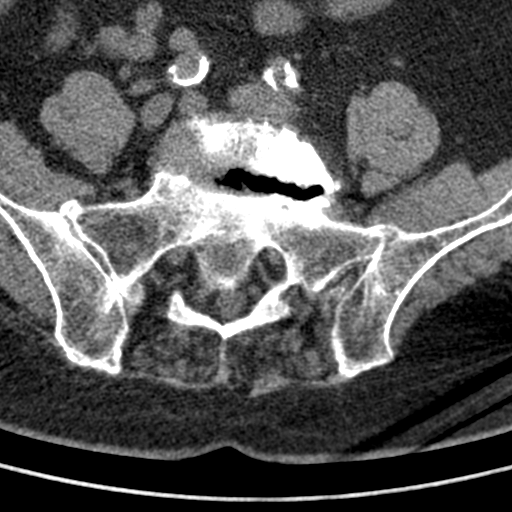
[im 16/121  bone]
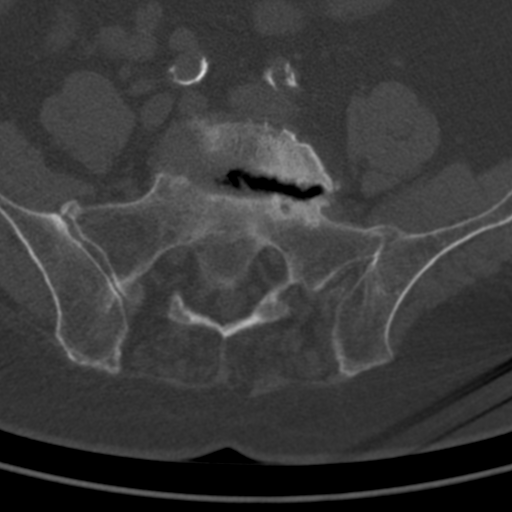
[im 31/121  bone]
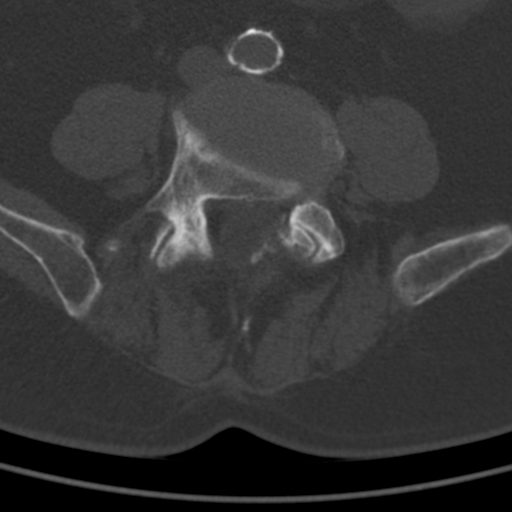
[im 46/121  bone]
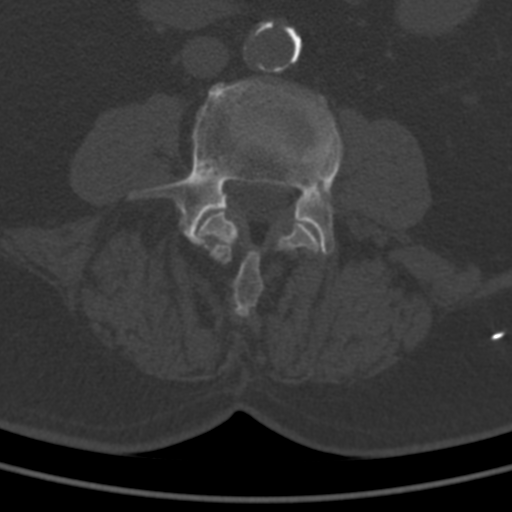
[im 61/121  bone]
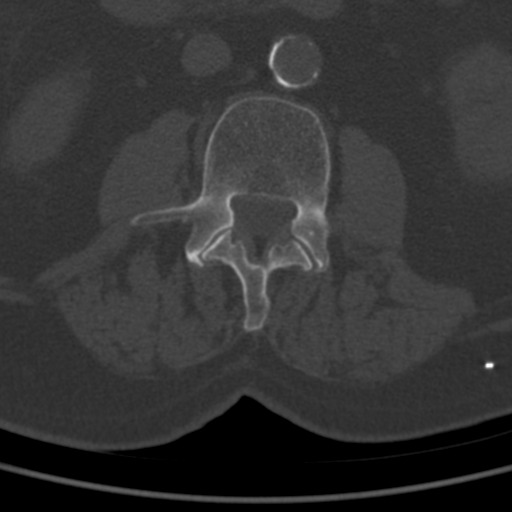
[im 76/121  soft-tissue]
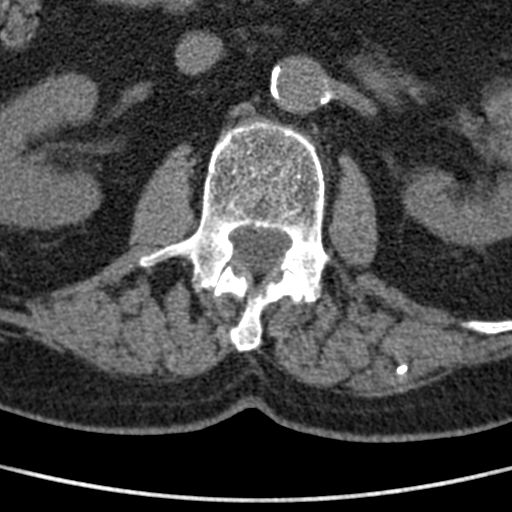
[im 76/121  bone]
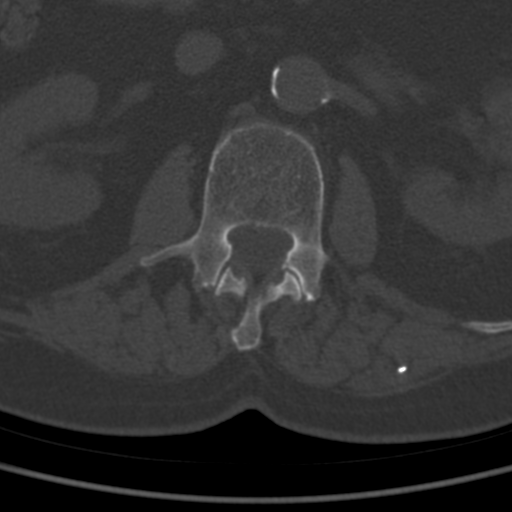
[im 91/121  bone]
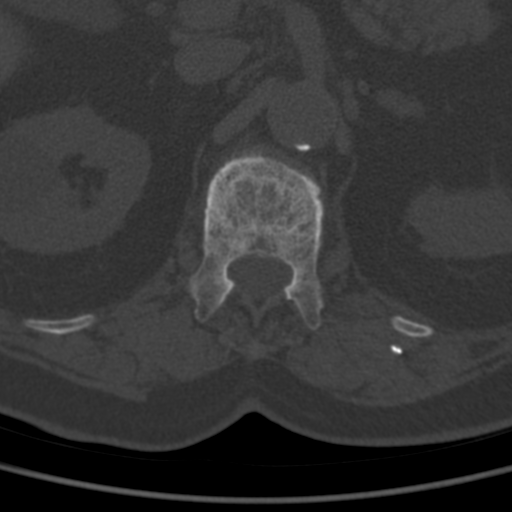
[im 106/121  bone]
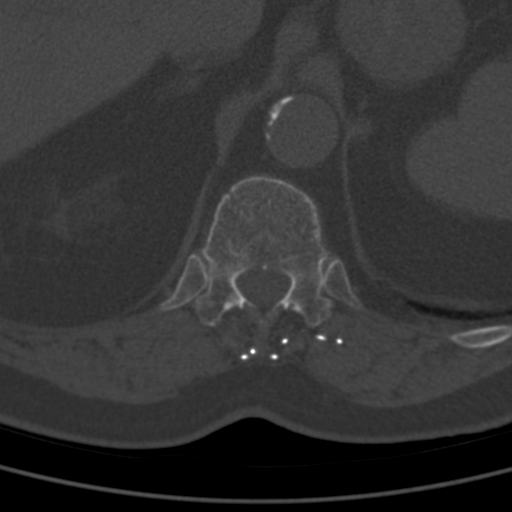

[7 of 14 positions shown; findings below may reference images not displayed]

FINDINGS: Segmentation: Normal

Alignment: 2 mm anterolisthesis L4-5 otherwise normal alignment

Vertebrae: Compression fracture of the superior endplate of L2
without progression. Progressive sclerotic bony healing is present
throughout the vertebral body. No retropulsion or spinal stenosis.
Mild superior endplate fracture T12 on the right is unchanged. No
new fracture.

Paraspinal and other soft tissues: Atherosclerotic aorta and iliacs
without aneurysm. No paraspinous mass. Right renal cyst. Spinal cord
stimulator noted with leads entering the spinal canal at
approximately T11 level.

Disc levels: T11-12: Mild disc and facet degeneration. Negative for
stenosis.

T12-L1: Mild disc degeneration with disc bulging and spurring.
Negative for stenosis

L1-2: Mild disc bulging and mild spurring.  No significant stenosis

L2-3: Mild disc bulging and mild facet degeneration. Negative for
stenosis

L3-4: Mild disc bulging and mild to moderate facet degeneration.
Mild subarticular stenosis bilaterally right greater than left

L4-5: Mild anterolisthesis. Advanced facet degeneration on the
right. Prior laminectomy on the right with bony overgrowth. Right
foraminal encroachment due to disc protrusion and spurring. Disc
protrusion may have progressed since the prior CT. Right L4 nerve
root impingement.

L5-S1: Left laminectomy. Disc degeneration with disc space
narrowing, spurring, and gas in the disc space. This is more
prominent the left than the right. Moderate left foraminal
encroachment due to spurring is unchanged.
IMPRESSION: Chronic and healing compression fractures of T12 and L1, unchanged
from CT of 01/03/2020. No new fracture

Multilevel degenerative change most prominent L4-5 and L5-S1

Right L4 nerve root impingement due to right foraminal encroachment.
This was present previously with possible progression of right
foraminal disc protrusion since the prior CT.

Moderate left foraminal encroachment due to spurring is unchanged.

## 2021-05-01 ENCOUNTER — Ambulatory Visit (INDEPENDENT_AMBULATORY_CARE_PROVIDER_SITE_OTHER): Payer: Medicare Other | Admitting: Cardiology

## 2021-05-01 ENCOUNTER — Other Ambulatory Visit: Payer: Self-pay

## 2021-05-01 ENCOUNTER — Encounter: Payer: Self-pay | Admitting: Cardiology

## 2021-05-01 VITALS — BP 100/65 | HR 59 | Ht 66.0 in | Wt 211.2 lb

## 2021-05-01 DIAGNOSIS — I701 Atherosclerosis of renal artery: Secondary | ICD-10-CM | POA: Diagnosis not present

## 2021-05-01 DIAGNOSIS — N183 Chronic kidney disease, stage 3 unspecified: Secondary | ICD-10-CM

## 2021-05-01 DIAGNOSIS — I2510 Atherosclerotic heart disease of native coronary artery without angina pectoris: Secondary | ICD-10-CM | POA: Diagnosis not present

## 2021-05-01 DIAGNOSIS — E782 Mixed hyperlipidemia: Secondary | ICD-10-CM | POA: Diagnosis not present

## 2021-05-01 NOTE — Progress Notes (Signed)
Cardiology Office Note:    Date:  05/01/2021   ID:  [name], DOB [birth year], MRN [id number]  PCP:  Biagio Borg, MD  Cardiologist:  Berniece Salines, DO  Electrophysiologist:  None   Referring MD: Biagio Borg, MD   " I am doing well"  History of Present Illness:    [name] is a 85 y.o. male with a hx of nonobstructive CAD per records cardiac catheterization in 2010 hypertension, hyperlipidemia, history of COVID-19 infection and COPD presents today for follow-up visit.   I last saw the patient on November 11, 2019 at that time giving his symptoms I recommended he undergo nuclear stress test as well as an echocardiogram.  In the interim the patient able to get his testing done, and I was able to discuss the results with him prior.   I saw the patient in August 2021 at that time he appeared to be doing well from a cardiovascular standpoint.  He was pending a result of his pulmonary function test.  No medication changes were made at that time.   I saw the patient on 08/29/2020 at that time he was doing well from a cardiovascular standpoint.   He is here today for a follow up visit. He offers no complaints today. He has had great improvement since his back surgery.  Is normal using his wheelchair.  He is here today with his son.  He also tells me he has a visit with his primary care doctor on Friday.  Past Medical History:  Diagnosis Date   Acute bronchitis per pt no fever--  cough since discharge from hospital 07-02-2015   per pcp note 07-05-2015  mild to moderate bronchitis vs pna   Allergic rhinitis    Bladder cancer (HCC)    BPH (benign prostatic hyperplasia)    Bradycardia    CAD (coronary artery disease)    CHF (congestive heart failure) (HCC)    Chronic low back pain    COPD (chronic obstructive pulmonary disease) (HCC)    DDD (degenerative disc disease), lumbosacral    Depression    Diverticulosis of colon (without mention of hemorrhage)    Dyspnea    Essential tremor     Fatty liver disease, nonalcoholic    GERD (gastroesophageal reflux disease)    Glaucoma    History of bladder cancer    2004--  TCC low-grade , non-invasive   History of colon polyps    History of exercise stress test    02-05-2007-- ETT (Clinically positive, electrically equivocal, submaximal ETT,  appropriate bp response to exercise   History of hiatal hernia    History of kidney stones    History of peptic ulcer    HLD (hyperlipidemia)    HTN (hypertension)    Nocturia    Pneumonia    Productive cough    Rheumatoid arthritis(714.0)    Right renal artery stenosis ([place])    per cath 03-14-2009 --- 40-50%   RLS (restless legs syndrome)    Stroke (HCC)    TIA   Vertigo, intermittent    Weak urinary stream    Wears dentures    upper   Wears glasses    Wears hearing aid    bilateral   Wears partial dentures    lower    Past Surgical History:  Procedure Laterality Date   CARDIAC CATHETERIZATION  02-12-2007  dr gregg taylor   Non-obstructive CAD,  20% pLCX,  20% LAD, ef 65%  CARDIAC CATHETERIZATION  03-14-2009  dr Johnsie Cancel   pLAD and mid diaginal 20-30% multiple lesions,  OM1 20%, right renal ostial 40-50%,  ef 60%   CATARACT EXTRACTION W/ INTRAOCULAR LENS  IMPLANT, BILATERAL  07/14/2014   CYSTOSCOPY WITH BIOPSY N/A 07/11/2015   Procedure: CYSTOSCOPY WITH COLD CUP RESECTION AND FULGERATION;  Surgeon: Rana Snare, MD;  Location: [facility];  Service: Urology;  Laterality: N/A;   HIP ARTHROSCOPY W/ LABRAL DEBRIDEMENT Left 06/29/2000   and chondraplasty   JOINT REPLACEMENT     right big toe replacement   KYPHOPLASTY N/A 03/28/2021   Procedure: KYPHOPLASTY lumbar one;  Surgeon: Melina Schools, MD;  Location: [facility];  Service: Orthopedics;  Laterality: N/A;   LUMBAR [place] SURGERY  07/16/2005   LUMBAR LAMINECTOMY/DECOMPRESSION MICRODISCECTOMY  07/09/2011   Procedure: LUMBAR LAMINECTOMY/DECOMPRESSION MICRODISCECTOMY;  Surgeon: Johnn Hai;  Location: [facility];   Service: Orthopedics;  Laterality: Left;  Decompression L5 - S1 on the Left and repair of dura   LUMBAR LAMINECTOMY/DECOMPRESSION MICRODISCECTOMY N/A 07/27/2013   Procedure: DECOMPRESSION L4 - L5 WITH EXCISION OF SYNOVIAL CYST AND, LATERAL MASS FUSION 1 LEVEL;  Surgeon: Johnn Hai, MD;  Location: [facility];  Service: Orthopedics;  Laterality: N/A;   LUMBAR LAMINECTOMY/DECOMPRESSION MICRODISCECTOMY Left 07/06/2014   Procedure:  MICRO LUMBAR DECOMPRESSION L5-S1 ON LEFT, L4-5 REDO;  Surgeon: Johnn Hai, MD;  Location: [facility];  Service: Orthopedics;  Laterality: Left;   SPINAL CORD STIMULATOR BATTERY EXCHANGE N/A 03/28/2021   Procedure: SPINAL CORD STIMULATOR BATTERY EXCHANGE/REPLACEMENT, L1 KYPHOPLASTY;  Surgeon: Melina Schools, MD;  Location: [facility];  Service: Orthopedics;  Laterality: N/A;  90 MINS LOCAL WITH IV REG   SPINAL CORD STIMULATOR INSERTION N/A 09/11/2016   Procedure: Spinal cord stimulator placement;  Surgeon: Melina Schools, MD;  Location: [place] [facility];  Service: Orthopedics;  Laterality: N/A;   TONSILLECTOMY AND ADENOIDECTOMY     TRANSTHORACIC ECHOCARDIOGRAM  09/20/2010   normal LVF, ef 55-65%/  mild MR, TR and PR/  mild LAE   TRANSURETHRAL RESECTION OF BLADDER TUMOR  09/21/2002    Current Medications: No outpatient medications have been marked as taking for the 05/01/21 encounter (Appointment) with Berniece Salines, DO.     Allergies:   Morphine and related, Sulfa antibiotics, Hydrochlorothiazide, Tape, Hytrin [terazosin], Prednisone, Latex, Lisinopril, and Sulfur dioxide   Social History   Socioeconomic History   Marital status: Widowed    Spouse name: Not on file   Number of children: 2   Years of education: Not on file   Highest education level: Not on file  Occupational History   Occupation: retired from [employer]: RETIRED  Tobacco Use   Smoking status: Former    Packs/day: 2.00    Years: 20.00    Pack years: 40.00    Types: Cigarettes    Quit date:  10/08/1982    Years since quitting: 38.5   Smokeless tobacco: Never  Vaping Use   Vaping Use: Never used  Substance and Sexual Activity   Alcohol use: Not Currently    Comment: rare wine or beer occassionally   Drug use: No   Sexual activity: Never  Other Topics Concern   Not on file  Social History Narrative   Not on file   Social Determinants of Health   Financial Resource Strain: Not on file  Food Insecurity: No Food Insecurity   Worried About [place] in the Last Year: Never true   Ran Out  of Food in the Last Year: Never true  Transportation Needs: No Transportation Needs   Lack of Transportation (Medical): No   Lack of Transportation (Non-Medical): No  Physical Activity: Not on file  Stress: Not on file  Social Connections: Not on file     Family History: The patient's family history includes Cancer in his brother, father, and sister; Diabetes in his sister; Heart attack in his mother.  ROS:   Review of Systems  Constitution: Negative for decreased appetite, fever and weight gain.  HENT: Negative for congestion, ear discharge, hoarse voice and sore throat.   Eyes: Negative for discharge, redness, vision loss in right eye and visual halos.  Cardiovascular: Negative for chest pain, dyspnea on exertion, leg swelling, orthopnea and palpitations.  Respiratory: Negative for cough, hemoptysis, shortness of breath and snoring.   Endocrine: Negative for heat intolerance and polyphagia.  Hematologic/Lymphatic: Negative for bleeding problem. Does not bruise/bleed easily.  Skin: Negative for flushing, nail changes, rash and suspicious lesions.  Musculoskeletal: Negative for arthritis, joint pain, muscle cramps, myalgias, neck pain and stiffness.  Gastrointestinal: Negative for abdominal pain, bowel incontinence, diarrhea and excessive appetite.  Genitourinary: Negative for decreased libido, genital sores and incomplete emptying.  Neurological: Negative for brief  paralysis, focal weakness, headaches and loss of balance.  Psychiatric/Behavioral: Negative for altered mental status, depression and suicidal ideas.  Allergic/Immunologic: Negative for HIV exposure and persistent infections.    EKGs/Labs/Other Studies Reviewed:    The following studies were reviewed today:   EKG: Sinus rhythm, heart rate 60 bpm.  Echo  IMPRESSIONS 11/17/19   1. Left ventricular ejection fraction, by estimation, is 60 to 65%. The left ventricle has normal function. The left ventricle has no regional wall motion abnormalities. Left ventricular diastolic parameters are consistent with Grade I diastolic dysfunction (impaired relaxation).   2. Right ventricular systolic function is normal. The right ventricular size is normal. There is normal pulmonary artery systolic pressure.   3. Left atrial size was mild to moderately dilated.   4. The mitral valve is normal in structure. No evidence of mitral valve regurgitation. No evidence of mitral stenosis.   5. The aortic valve is normal in structure. Aortic valve regurgitation is mild. Mild aortic valve sclerosis is present, with no evidence of aortic valve stenosis.   6. There is mild dilatation of the ascending aorta measuring 40 mm.   7. The inferior vena cava is normal in size with greater than 50% respiratory variability, suggesting right atrial pressure of 3 mmHg.    Pharmacologic nuclear stress test Nuclear stress EF: 61%. There was no ST segment deviation noted during stress. The study is normal. This is a low risk study. The left ventricular ejection fraction is normal (55-65%).   Normal stress nuclear study with no ischemia or infarction.  Gated ejection fraction 61% with normal wall motion.    Recent Labs: 10/24/2020: ALT 16; TSH 2.53 03/21/2021: BUN 10; Creatinine, Ser 0.99; Hemoglobin 14.7; Platelets 220; Potassium 3.6; Sodium 135  Recent Lipid Panel    Component Value Date/Time   CHOL 137 10/24/2020 1603   TRIG  131.0 10/24/2020 1603   HDL 31.20 (L) 10/24/2020 1603   CHOLHDL 4 10/24/2020 1603   VLDL 26.2 10/24/2020 1603   LDLCALC 80 10/24/2020 1603   LDLDIRECT 86.0 06/24/2019 1204    Physical Exam:    VS:  There were no vitals taken for this visit.    Wt Readings from Last 3 Encounters:  03/28/21 200 lb (90.7  kg)  03/21/21 200 lb (90.7 kg)  03/07/21 214 lb 6.4 oz (97.3 kg)     GEN: Well nourished, well developed in no acute distress HEENT: Normal NECK: No JVD; No carotid bruits LYMPHATICS: No lymphadenopathy CARDIAC: S1S2 noted,RRR, no murmurs, rubs, gallops RESPIRATORY:  Clear to auscultation without rales, wheezing or rhonchi  ABDOMEN: Soft, non-tender, non-distended, +bowel sounds, no guarding. EXTREMITIES: No edema, No cyanosis, no clubbing MUSCULOSKELETAL:  No deformity  SKIN: Warm and dry NEUROLOGIC:  Alert and oriented x 3, non-focal PSYCHIATRIC:  Normal affect, good insight  ASSESSMENT:    1. CAD in native artery   2. RENAL ARTERY STENOSIS   3. Stage 3 chronic kidney disease, unspecified whether stage 3a or 3b CKD ([place])   4. Mixed hyperlipidemia    PLAN:     He appears to be doing well from a cardiovascular standpoint.  His blood pressure is appropriate.  No medication changes will be made today.  He has appointment with his PCP on Friday.  He has a whole set of lab  To be done so we will hold off on doing any blood work today.  I have requested that his daughter be faxed out to me.  The patient is in agreement with the above plan. The patient left the office in stable condition.  The patient will follow up in 1 year or sooner if needed.   Medication Adjustments/Labs and Tests Ordered: Current medicines are reviewed at length with the patient today.  Concerns regarding medicines are outlined above.  No orders of the defined types were placed in this encounter.  No orders of the defined types were placed in this encounter.   There are no Patient Instructions on  file for this visit.   Adopting a Healthy Lifestyle.  Know what a healthy weight is for you (roughly BMI <25) and aim to maintain this   Aim for 7+ servings of fruits and vegetables daily   65-80+ fluid ounces of water or unsweet tea for healthy kidneys   Limit to max 1 drink of alcohol per day; avoid smoking/tobacco   Limit animal fats in diet for cholesterol and heart health - choose grass fed whenever available   Avoid highly processed foods, and foods high in saturated/trans fats   Aim for low stress - take time to unwind and care for your mental health   Aim for 150 min of moderate intensity exercise weekly for heart health, and weights twice weekly for bone health   Aim for 7-9 hours of sleep daily   When it comes to diets, agreement about the perfect plan isnt easy to find, even among the experts. Experts at the [place] developed an idea known as the Healthy Eating Plate. Just imagine a plate divided into logical, healthy portions.   The emphasis is on diet quality:   Load up on vegetables and fruits - one-half of your plate: Aim for color and variety, and remember that potatoes dont count.   Go for whole grains - one-quarter of your plate: Whole wheat, barley, wheat berries, quinoa, oats, brown rice, and foods made with them. If you want pasta, go with whole wheat pasta.   Protein power - one-quarter of your plate: Fish, chicken, beans, and nuts are all healthy, versatile protein sources. Limit red meat.   The diet, however, does go beyond the plate, offering a few other suggestions.   Use healthy plant oils, such as olive, canola, soy, corn, sunflower  and peanut. Check the labels, and avoid partially hydrogenated oil, which have unhealthy trans fats.   If youre thirsty, drink water. Coffee and tea are good in moderation, but skip sugary drinks and limit milk and dairy products to one or two daily servings.   The type of carbohydrate in the  diet is more important than the amount. Some sources of carbohydrates, such as vegetables, fruits, whole grains, and beans-are healthier than others.   Finally, stay active  Signed, Berniece Salines, DO  05/01/2021 8:00 AM    [place]

## 2021-05-01 NOTE — Patient Instructions (Signed)
Medication Instructions:  Your physician recommends that you continue on your current medications as directed. Please refer to the Current Medication list given to you today.   *If you need a refill on your cardiac medications before your next appointment, please call your pharmacy*   Lab Work: None If you have labs (blood work) drawn today and your tests are completely normal, you will receive your results only by: MyChart Message (if you have MyChart) OR A paper copy in the mail If you have any lab test that is abnormal or we need to change your treatment, we will call you to review the results.   Testing/Procedures: None   Follow-Up: At [facility], you and your health needs are our priority.  As part of our continuing mission to provide you with exceptional heart care, we have created designated Provider Care Teams.  These Care Teams include your primary Cardiologist (physician) and Advanced Practice Providers (APPs -  Physician Assistants and Nurse Practitioners) who all work together to provide you with the care you need, when you need it.  We recommend signing up for the patient portal called "MyChart".  Sign up information is provided on this After Visit Summary.  MyChart is used to connect with patients for Virtual Visits (Telemedicine).  Patients are able to view lab/test results, encounter notes, upcoming appointments, etc.  Non-urgent messages can be sent to your provider as well.   To learn more about what you can do with MyChart, go to [email or web address].    Your next appointment:   1 year(s)  The format for your next appointment:   In Person  Provider:   Kardie Tobb, DO [address], [place], [place] [address]    Other Instructions   

## 2021-05-10 ENCOUNTER — Other Ambulatory Visit: Payer: Self-pay | Admitting: Internal Medicine

## 2021-05-13 ENCOUNTER — Encounter: Payer: Self-pay | Admitting: Podiatry

## 2021-05-13 ENCOUNTER — Other Ambulatory Visit: Payer: Self-pay

## 2021-05-13 ENCOUNTER — Ambulatory Visit (INDEPENDENT_AMBULATORY_CARE_PROVIDER_SITE_OTHER): Payer: Medicare Other | Admitting: Podiatry

## 2021-05-13 DIAGNOSIS — M79676 Pain in unspecified toe(s): Secondary | ICD-10-CM | POA: Diagnosis not present

## 2021-05-13 DIAGNOSIS — B351 Tinea unguium: Secondary | ICD-10-CM

## 2021-05-13 DIAGNOSIS — Z4889 Encounter for other specified surgical aftercare: Secondary | ICD-10-CM | POA: Diagnosis not present

## 2021-05-13 NOTE — Progress Notes (Signed)
  Subjective:  Patient ID: [name], male    DOB: Aug 21, 1935,  MRN: [id number]  THIMOTHY BARRETTA presents to clinic today for thick, elongated toenails both feet which are tender when wearing enclosed shoe gear.  Patient states he has had back surgery and feels much better now. He doesn't use his motorized chair as much. He can walk without his rollator, but would rather have it for stability. He is pleased overall.  PCP is Biagio Borg, MD , and last visit was 03/07/2021.  Allergies  Allergen Reactions   Morphine And Related Other (See Comments)    Migraine    Sulfa Antibiotics Rash and Itching    Patient  Is ok to use paper tape   Hydrochlorothiazide Other (See Comments)    REACTION: hyponatremia with daily use   Tape Itching    Patient  Is ok to use paper tape   Hytrin [Terazosin] Other (See Comments)    Dizziness   Prednisone Other (See Comments)    Caused problem with blood sugar   Latex Rash   Lisinopril Cough   Sulfur Dioxide Itching and Rash    Review of Systems: Negative except as noted in the HPI. Objective:   Constitutional SEUNG NIDIFFER is a pleasant 85 y.o. Caucasian male, WD, WN in NAD. AAO x 3.   Vascular CFT immediate b/l LE. Palpable DP/PT pulses b/l LE. Digital hair present b/l. Skin temperature gradient WNL b/l. No pain with calf compression b/l. No edema noted b/l. Pedal hair present. Lower extremity skin temperature gradient within normal limits. No cyanosis or clubbing noted.  Neurologic Normal speech. Oriented to person, place, and time. Protective sensation intact 5/5 intact bilaterally with 10g monofilament b/l.  Dermatologic Pedal skin is warm and supple b/l LE. No open wounds b/l LE. No interdigital macerations noted b/l LE. Toenails 1-5 b/l elongated, discolored, dystrophic, thickened, crumbly with subungual debris and tenderness to dorsal palpation.  Orthopedic: Normal muscle strength 5/5 to all lower extremity muscle groups bilaterally. No pain crepitus  or joint limitation noted with ROM b/l lower extremities. No gross bony deformities b/l lower extremities. Utilizes rollator for ambulation assistance.   Radiographs: None Assessment:   1. Pain due to onychomycosis of toenail    Plan:  Patient was evaluated and treated and all questions answered. Consent given for treatment as described below: -No new findings. No new orders. -Toenails 1-5 left and 1-5 right debrided in length and girth without iatrogenic bleeding with sterile nail nipper and dremel.  -Patient/POA to call should there be question/concern in the interim.  Return in about 3 months (around 08/13/2021).  Marzetta Board, DPM

## 2021-05-28 DIAGNOSIS — M79605 Pain in left leg: Secondary | ICD-10-CM | POA: Diagnosis not present

## 2021-05-28 DIAGNOSIS — M5451 Vertebrogenic low back pain: Secondary | ICD-10-CM | POA: Diagnosis not present

## 2021-05-28 DIAGNOSIS — R262 Difficulty in walking, not elsewhere classified: Secondary | ICD-10-CM | POA: Diagnosis not present

## 2021-05-28 DIAGNOSIS — M6281 Muscle weakness (generalized): Secondary | ICD-10-CM | POA: Diagnosis not present

## 2021-06-16 ENCOUNTER — Other Ambulatory Visit: Payer: Self-pay | Admitting: Internal Medicine

## 2021-06-25 ENCOUNTER — Other Ambulatory Visit: Payer: Self-pay

## 2021-06-25 MED ORDER — GABAPENTIN 300 MG PO CAPS: 900.0000 mg | ORAL_CAPSULE | Freq: Every day | ORAL | 2 refills | Status: DC

## 2021-07-01 DIAGNOSIS — M5451 Vertebrogenic low back pain: Secondary | ICD-10-CM | POA: Diagnosis not present

## 2021-07-10 ENCOUNTER — Ambulatory Visit (INDEPENDENT_AMBULATORY_CARE_PROVIDER_SITE_OTHER): Payer: Medicare Other | Admitting: *Deleted

## 2021-07-10 DIAGNOSIS — G894 Chronic pain syndrome: Secondary | ICD-10-CM

## 2021-07-10 DIAGNOSIS — I10 Essential (primary) hypertension: Secondary | ICD-10-CM

## 2021-07-10 NOTE — Patient Instructions (Addendum)
Visit Information  Mali and Kristal, thank you for taking time to talk with me today about [name] health care needs. Please don't hesitate to contact me if I can be of assistance to you before our next scheduled telephone appointment.  Below are the goals we discussed today:  Patient Self-Care Activities: Patient [name] will:  Take medications as prescribed after they have been prepared by caregiver and placed in pill box Attend all scheduled provider appointments Call pharmacy for medication refills Call provider office for new concerns or questions Continue efforts to follow heart healthy, low salt diet Continue to periodically monitor and write down on paper blood pressures at home Continue to monitor and write down on paper daily weights to stay on top of fluid retention Keep up the great work at preventing falls- continue using your walker regularly  [place] next scheduled telephone follow up visit/ appointment with care management team member is scheduled on:  Tuesday, December 17, 2021 at 3:30  pm  If you need to cancel or re-schedule our visit, please call [phone] and our care guide team will be happy to assist you.   I look forward to hearing about your progress.   Oneta Rack, RN, BSN, [place] [phone]: direct office  If you are experiencing a Mental Health or [place] or need someone to talk to, please  call the Suicide and Crisis Lifeline: 988 call the [place] National Suicide Prevention Lifeline: (731) 143-8857 or TTY: 724 716 5488 TTY 520-063-9940) to talk to a trained counselor call 1-800-273-TALK (toll free, 24 hour hotline) go to [facility] [address], [place] [id number]) call 911   The patient verbalized understanding of instructions, educational materials, and care plan provided today and agreed to receive a mailed copy of patient  instructions, educational materials, and care plan  Oneta Rack, RN, BSN, [place] [phone]: direct office  Understanding Your Risk for Falls Each year, millions of people have serious injuries from falls. It is important to understand your risk for falling. Talk with your health care provider about your risk and what you can do to lower it. There are actions you can take at home to lower your risk and prevent falls. If you do have a serious fall, make sure to tell your health care provider. Falling once raises your risk of falling again. How can falls affect me? Serious injuries from falls are common. These include: Broken bones, such as hip fractures. Head injuries, such as traumatic brain injuries (TBI) or concussion. A fear of falling can cause you to avoid activities and stay at home. This can make your muscles weaker and actually raise your risk for a fall. What can increase my risk? There are a number of risk factors that increase your risk for falling. The more risk factors you have, the higher your risk of falling. Serious injuries from a fall happen most often to people older than age 73. Children and young adults ages 69-29 are also at higher risk. Common risk factors include: Weakness in the lower body. Lack (deficiency) of vitamin D. Being generally weak or confused due to long-term (chronic) illness. Dizziness or balance problems. Poor vision. Medicines that cause dizziness or drowsiness. These can include medicines for your blood pressure, heart, anxiety, insomnia, or edema, as well as pain medicines and muscle relaxants. Other risk factors include: Drinking alcohol. Having had  a fall in the past. Having depression. Having foot pain or wearing improper footwear. Working at a dangerous job. Having any of the following in your home: Tripping hazards, such as floor clutter or loose rugs. Poor  lighting. Pets. Having dementia or memory loss. What actions can I take to lower my risk of falling?   Physical activity Maintain physical fitness. Do strength and balance exercises. Consider taking a regular class to build strength and balance. Yoga and tai chi are good options. Vision Have your eyes checked every year and your vision prescription updated as needed. Walking aids and footwear Wear nonskid shoes. Do not wear high heels. Do not walk around the house in socks or slippers. Use a cane or walker as told by your health care provider. Home safety Attach secure railings on both sides of your stairs. Install grab bars for your tub, shower, and toilet. Use a bath mat in your tub or shower. Use good lighting in all rooms. Keep a flashlight near your bed. Make sure there is a clear path from your bed to the bathroom. Use night-lights. Do not use throw rugs. Make sure all carpeting is taped or tacked down securely. Remove all clutter from walkways and stairways, including extension cords. Repair uneven or broken steps. Avoid walking on icy or slippery surfaces. Walk on the grass instead of on icy or slick sidewalks. Use ice melt to get rid of ice on walkways. Use a cordless phone. Questions to ask your health care provider Can you help me check my risk for a fall? Do any of my medicines make me more likely to fall? Should I take a vitamin D supplement? What exercises can I do to improve my strength and balance? Should I make an appointment to have my vision checked? Do I need a bone density test to check for weak bones or osteoporosis? Would it help to use a cane or a walker? Where to find more information Centers for Disease Control and Prevention, [place]: http://www.wolf.info/ Community-Based Fall Prevention Programs: http://www.wolf.info/ National Institute on Aging: http://kim-miller.com/ Contact a health care provider if: You fall at home. You are afraid of falling at home. You feel weak, drowsy,  or dizzy. Summary Serious injuries from a fall happen most often to people older than age 21. Children and young adults ages 21-29 are also at higher risk. Talk with your health care provider about your risks for falling and how to lower those risks. Taking certain precautions at home can lower your risk for falling. If you fall, always tell your health care provider. This information is not intended to replace advice given to you by your health care provider. Make sure you discuss any questions you have with your health care provider. Document Revised: 02/01/2020 Document Reviewed: 02/01/2020 Elsevier Patient Education  [place].

## 2021-07-10 NOTE — Chronic Care Management (AMB) (Signed)
Chronic Care Management   CCM RN Visit Note  07/10/2021 Name: [name] MRN: [id number] DOB: [birth year]  Subjective: [name] is a 85 y.o. year old male who is a primary care patient of Biagio Borg, MD. The care management team was consulted for assistance with disease management and care coordination needs.    Engaged with patient's son/ caregiver Mali, on [facility] DPR by telephone for follow up visit in response to provider referral for case management and/or care coordination services.   Consent to Services:  The patient was given information about Chronic Care Management services, agreed to services, and gave verbal consent prior to initiation of services.  Please see initial visit note for detailed documentation.  Patient agreed to services and verbal consent obtained.   Assessment: Review of patient past medical history, allergies, medications, health status, including review of consultants reports, laboratory and other test data, was performed as part of comprehensive evaluation and provision of chronic care management services.   SDOH (Social Determinants of Health) assessments and interventions performed:  SDOH Interventions    Flowsheet Row Most Recent Value  SDOH Interventions   Food Insecurity Interventions Intervention Not Indicated  Housing Interventions Intervention Not Indicated  [continues to reside with son and daughter-in-law]  Transportation Interventions Intervention Not Indicated  [son contiues to provide transportation]       CCM Care Plan Allergies  Allergen Reactions   Morphine And Related Other (See Comments)    Migraine    Sulfa Antibiotics Rash and Itching    Patient  Is ok to use paper tape   Hydrochlorothiazide Other (See Comments)    REACTION: hyponatremia with daily use   Tape Itching    Patient  Is ok to use paper tape   Hytrin [Terazosin] Other (See Comments)    Dizziness   Prednisone Other (See Comments)    Caused problem with blood  sugar   Latex Rash   Lisinopril Cough   Sulfur Dioxide Itching and Rash   Outpatient Encounter Medications as of 07/10/2021  Medication Sig   acetaminophen (TYLENOL) 650 MG CR tablet Take 1,300 mg by mouth every 8 (eight) hours as needed for pain.   albuterol (PROVENTIL) (2.5 MG/3ML) 0.083% nebulizer solution Take 3 mLs (2.5 mg total) by nebulization every 6 (six) hours as needed for wheezing or shortness of breath.   albuterol (VENTOLIN HFA) 108 (90 Base) MCG/ACT inhaler Inhale 2 puffs into the lungs every 6 (six) hours as needed for wheezing or shortness of breath.   amLODipine (NORVASC) 10 MG tablet TAKE 1 TABLET EVERY DAY   benzonatate (TESSALON) 100 MG capsule TAKE 1 CAPSULE(100 MG) BY MOUTH THREE TIMES DAILY AS NEEDED FOR COUGH   brimonidine (ALPHAGAN) 0.15 % ophthalmic solution Place 1 drop into both eyes 2 (two) times daily.   cetirizine (ZYRTEC) 10 MG tablet Take 10 mg by mouth daily.   citalopram (CELEXA) 20 MG tablet Take 1 tablet (20 mg total) by mouth daily. (Patient taking differently: Take 10 mg by mouth daily.)   clonazePAM (KLONOPIN) 0.5 MG tablet TAKE 1 TABLET(0.5 MG) BY MOUTH TWICE DAILY AS NEEDED FOR ANXIETY   clotrimazole (LOTRIMIN) 1 % cream Apply 1 application topically 2 (two) times daily as needed (FOR RASH). Prescribed for rash in groin   dextromethorphan-guaiFENesin (MUCINEX DM) 30-600 MG 12hr tablet Take 1 tablet by mouth 2 (two) times daily.   fluticasone (FLONASE) 50 MCG/ACT nasal spray USE 2 SPRAYS IN EACH NOSTRIL DAILY   furosemide (  LASIX) 20 MG tablet Take 1 tablet (20 mg total) by mouth 2 (two) times a week. ON TUESDAYS AND SATURDAYS   gabapentin (NEURONTIN) 300 MG capsule Take 3-4 capsules (900-1,200 mg total) by mouth at bedtime.   hydrALAZINE (APRESOLINE) 10 MG tablet TAKE 1 TABLET THREE TIMES DAILY AS NEEDED FOR BLOOD PRESSURE MORE THAN 150/90 (Patient taking differently: Take 10 mg by mouth 3 (three) times daily.)   hydroxypropyl methylcellulose /  hypromellose (ISOPTO TEARS / GONIOVISC) 2.5 % ophthalmic solution Place 1 drop into both eyes 2 (two) times daily.   Insulin Pen Needle 29G X 10MM MISC Use as directed   LORazepam (ATIVAN) 1 MG tablet Take 1 mg by mouth daily as needed for anxiety.   losartan (COZAAR) 100 MG tablet Take 1 tablet (100 mg total) by mouth daily.   meclizine (ANTIVERT) 25 MG tablet TAKE 1 TABLET TWICE DAILY AS NEEDED FOR DIZZINESS (Patient taking differently: Take 25 mg by mouth 2 (two) times daily.)   methocarbamol (ROBAXIN) 500 MG tablet Take 500 mg by mouth 3 (three) times daily as needed for muscle spasms.   nitroGLYCERIN (NITROSTAT) 0.4 MG SL tablet PLACE 1 TABLET UNDER THE TONGUE EVERY 5 MINUTES AS NEEDED FOR CHEST PAIN. MAX 3 DOSES. CALL 911 IF NO IMPROVEMENT AFTER 1ST DOSE   omeprazole (PRILOSEC) 40 MG capsule TAKE 1 CAPSULE TWICE DAILY   potassium chloride (KLOR-CON) 10 MEQ tablet Take one tablet daily for 1 week then take one tablet on on tuesdays and saturdays.   primidone (MYSOLINE) 250 MG tablet TAKE 1 TABLET (250 MG TOTAL) BY MOUTH 4 (FOUR) TIMES DAILY. (Patient taking differently: Take 125 mg by mouth 3 (three) times daily.)   simvastatin (ZOCOR) 40 MG tablet TAKE 1 AND 1/2 TABLETS AT BEDTIME   tamsulosin (FLOMAX) 0.4 MG CAPS capsule Take 1 capsule (0.4 mg total) by mouth daily.   timolol (TIMOPTIC) 0.5 % ophthalmic solution Place 1 drop into both eyes 2 (two) times daily.    Tiotropium Bromide Monohydrate (SPIRIVA RESPIMAT) 2.5 MCG/ACT AERS Inhale 2.5 mcg into the lungs daily.   Travoprost, BAK Free, (TRAVATAN Z) 0.004 % SOLN ophthalmic solution Place 1 drop into both eyes at bedtime.   triamcinolone (KENALOG) 0.025 % cream Apply 1 application topically 2 (two) times daily as needed (rash).    vitamin B-12 (CYANOCOBALAMIN) 500 MCG tablet Take 500 mcg by mouth daily.   [DISCONTINUED] insulin aspart (NOVOLOG) 100 UNIT/ML FlexPen Inject 4 Units into the skin 3 (three) times daily with meals. (Patient not  taking: Reported on 01/19/2020)   [DISCONTINUED] Insulin Detemir (LEVEMIR) 100 UNIT/ML Pen Inject 5 Units into the skin 2 (two) times daily. (Patient not taking: Reported on 01/19/2020)   [DISCONTINUED] ipratropium-albuterol (DUONEB) 0.5-2.5 (3) MG/3ML SOLN Take 3 mLs by nebulization every 6 (six) hours as needed. (Patient not taking: Reported on 01/19/2020)   No facility-administered encounter medications on file as of 07/10/2021.   Patient Active Problem List   Diagnosis Date Noted   Preop exam for internal medicine 03/07/2021   Other abnormalities of gait and mobility 02/04/2021   Cataract 02/04/2021   Wears partial dentures    Wears glasses    Wears hearing aid    Wears dentures    Weak urinary stream    Vertigo, intermittent    RLS (restless legs syndrome)    Productive cough    Nocturia    History of peptic ulcer    History of kidney stones    History of hiatal  hernia    History of exercise stress test    History of colon polyps    History of bladder cancer    Fatty liver disease, nonalcoholic    Glaucoma    DDD (degenerative disc disease), lumbosacral    Chronic low back pain    BPH (benign prostatic hyperplasia)    Acute bronchitis    Aortic atherosclerosis ([place]) 04/25/2020   AKI (acute kidney injury) ([place]) 01/23/2020   Chest pain of uncertain etiology 40/02/6760   Essential hypertension 11/12/2019   Dizziness 11/12/2019   Obesity (BMI 30-39.9) 11/12/2019   History of COVID-19 10/03/2019   Acute respiratory failure with hypoxia ([place]) 10/03/2019   Pneumonia due to COVID-19 virus 08/16/2019   Acute respiratory failure due to COVID-19 ([place]) 08/16/2019   COVID-19 virus infection 08/09/2019   Memory loss 06/24/2019   Wheezing 06/24/2019   Abrasion of left arm 04/11/2019   Joint swelling of lower leg 11/29/2018   Abnormal urinalysis 02/25/2018   Anxiety 01/08/2018   Laceration of hand 12/18/2017   Pain of left hand 12/18/2017   Laceration of scalp without foreign body  06/09/2017   Nonintractable headache 06/09/2017   Acute sinus infection 04/14/2017   Nausea and vomiting 03/27/2017   Hyponatremia 03/17/2017   Right lumbar radiculopathy 01/21/2017   Fall 01/21/2017   Synovial cyst 01/21/2017   Chronic pain 09/11/2016   Generalized weakness 05/22/2016   UTI (urinary tract infection) 05/22/2016   Weakness 05/22/2016   Fever blister 10/18/2015   Dyspnea 10/17/2015   Bradycardia 09/17/2015   Chest pain at rest 09/17/2015   CAD in native artery 09/17/2015   CHF (congestive heart failure) ([place]) 09/17/2015   Bladder cancer ([place]) 09/17/2015   Essential tremor 09/17/2015   Chest pain 09/17/2015   Anginal chest pain at rest [facility]) 09/17/2015   Hypokalemia 07/13/2015   Vertigo 07/01/2015   Dizzinesses 07/01/2015   Spinal stenosis of lumbar region at multiple levels 07/06/2014   Angular cheilitis 05/05/2014   Left lumbar radiculopathy 05/04/2014   Right elbow pain 12/29/2013   Lateral epicondylitis of right elbow 12/29/2013   Cough 09/14/2013   Spinal stenosis, lumbar 07/27/2013   Palpable mass of neck 07/21/2013   Vasovagal episode 01/10/2013   Left otitis media 10/28/2012   Impaired glucose tolerance 10/28/2012   Cellulitis 04/24/2012   Hemorrhoids 04/24/2012   Syncope 04/21/2012   Depression 09/12/2010   RENAL ARTERY STENOSIS 09/12/2010   FATIGUE 09/12/2010   BUNION, RIGHT FOOT 02/05/2010   RECTAL BLEEDING 07/10/2009   Diarrhea 07/10/2009   DIVERTICULOSIS OF COLON 03/28/2009   FATTY LIVER DISEASE 03/28/2009   BRADYCARDIA 03/20/2009   DIZZINESS 10/17/2008   CHEST PAIN 10/17/2008   Malignant neoplasm of bladder ([place]) 02/07/2008   Allergic rhinitis 02/07/2008   COLONIC POLYPS, HX OF 02/07/2008   Hyperlipidemia 11/24/2007   GERD 11/24/2007   HIATAL HERNIA WITH REFLUX 11/24/2007   CKD (chronic kidney disease) stage 3, GFR 30-59 ml/min ([place]) 11/24/2007   Rheumatoid arthritis ([place]) 11/24/2007   [place] DISEASE, LUMBAR 11/24/2007    ADENOIDECTOMY, HX OF 11/24/2007   Conditions to be addressed/monitored:  HTN and chronic pain  Care Plan : RN Care Manager Plan of Care  Updates made by Knox Royalty, RN since 07/10/2021 12:00 AM     Problem: Chronic Disease Management Needs   Priority: Medium     Long-Range Goal: Development of plan of care for long term chronic disease management   Start Date: 03/22/2021  Expected End Date: 03/22/2022  Priority: Medium  Note:   Current Barriers:  Chronic Disease Management support and education needs related to HTN and chronic pain  RNCM Clinical Goal(s):  Patient will demonstrate ongoing health management independence HTN; chronic pain  through collaboration with RN Care manager, provider, and care team.   Interventions: 1:1 collaboration with primary care provider regarding development and update of comprehensive plan of care as evidenced by provider attestation and co-signature Inter-disciplinary care team collaboration (see longitudinal plan of care) Evaluation of current treatment plan related to  self management and patient's adherence to plan as established by provider Confirmed patient obtained flu vaccine for 2022-23 flu/ winter season; caregiver also confirms he has recently received COVID booster vaccine  Hypertension: (Status: Goal on Track (progressing): YES.) Last practice recorded BP readings:  BP Readings from Last 3 Encounters:  05/01/21 100/65  03/28/21 122/65  03/21/21 139/67  Most recent eGFR/CrCl: No results found for: EGFR  No components found for: CRCL  Evaluation of current treatment plan related to hypertension self management and patient's adherence to plan as established by provider;   Reviewed prescribed diet heart healthy, low salt Discussed plans with patient for ongoing care management follow up and provided patient with direct contact information for care management team; Confirmed patient continues to monitor/ write down on paper blood  pressures at home- caregiver not currently at home to review specific values: reports "all seem fine" Confirmed no medication concerns: caregivers continue to prepare medications in pill box, patient then takes independently Confirmed patient periodically monitoring/ recording weights at home; caregiver currently reports consistent weights at home but does not have specific values to review; he is not at home at time of our call Reiterated previously provided education around purpose of daily weights in setting of concurrent CHF; confirmed no clinical concerns with breathing status or peripheral swelling Reviewed recent cardiology provider appointment 05/01/21: confirmed no medication changes, no change to overall general, well established plan of care Reviewed upcoming provider appointments: 07/24/21- podiatry: confirmed caregiver will provide transportation  Pain:  (Status: Goal on Track (progressing): YES.) Pain assessment performed- caregiver reports patient's pain well- managed after September procedure (nerve stimulator) continues using hemp for pain management-- current interventions/ medications effective per caregiver report today Confirmed no recent changes to medications Reviewed provider established plan for pain management; Discussed importance of adherence to all scheduled medical appointments; Counseled on the importance of reporting any/all new or changed pain symptoms or management strategies to pain management provider; Advised patient to report to care team affect of pain on daily activities; Discussed use of relaxation techniques and/or diversional activities to assist with pain reduction (distraction, imagery, relaxation, massage, acupressure, TENS, heat, and cold application; Confirmed patient has had no new/ recent falls since last outreach: positive reinforcement provided with encouragement to continue efforts  Patient Goals/Self-Care Activities: As evidenced by review of  EHR, collaboration with care team, and patient reporting during CCM RN CM outreach,  Patient Brenden will:  Take medications as prescribed after they have been prepared by caregiver and placed in pill box Attend all scheduled provider appointments Call pharmacy for medication refills Call provider office for new concerns or questions Continue efforts to follow heart healthy, low salt diet Continue to periodically monitor and write down on paper blood pressures at home Continue to monitor and write down on paper daily weights to stay on top of fluid retention Keep up the great work at preventing falls- continue using your walker regularly    Plan: Telephone follow up  appointment with care management team member scheduled for:  Tuesday, December 17, 2021 at 3:30 pm The patient has been provided with contact information for the care management team and has been advised to call with any health related questions or concerns  Oneta Rack, RN, BSN, [place] [phone]: direct office

## 2021-07-20 ENCOUNTER — Other Ambulatory Visit: Payer: Self-pay | Admitting: Cardiology

## 2021-07-22 ENCOUNTER — Other Ambulatory Visit: Payer: Self-pay | Admitting: Internal Medicine

## 2021-07-22 NOTE — Telephone Encounter (Signed)
Please refill as per office routine med refill policy (all routine meds to be refilled for 3 mo or monthly (per pt preference) up to one year from last visit, then month to month grace period for 3 mo, then further med refills will have to be denied) ? ?

## 2021-07-24 ENCOUNTER — Ambulatory Visit (INDEPENDENT_AMBULATORY_CARE_PROVIDER_SITE_OTHER): Payer: Medicare Other | Admitting: Podiatry

## 2021-07-24 ENCOUNTER — Other Ambulatory Visit: Payer: Self-pay

## 2021-07-24 DIAGNOSIS — R269 Unspecified abnormalities of gait and mobility: Secondary | ICD-10-CM | POA: Insufficient documentation

## 2021-07-24 DIAGNOSIS — M79676 Pain in unspecified toe(s): Secondary | ICD-10-CM | POA: Diagnosis not present

## 2021-07-24 DIAGNOSIS — Z7689 Persons encountering health services in other specified circumstances: Secondary | ICD-10-CM | POA: Insufficient documentation

## 2021-07-24 DIAGNOSIS — B351 Tinea unguium: Secondary | ICD-10-CM | POA: Diagnosis not present

## 2021-07-24 DIAGNOSIS — F528 Other sexual dysfunction not due to a substance or known physiological condition: Secondary | ICD-10-CM | POA: Insufficient documentation

## 2021-07-24 DIAGNOSIS — G252 Other specified forms of tremor: Secondary | ICD-10-CM | POA: Insufficient documentation

## 2021-07-24 DIAGNOSIS — J8410 Pulmonary fibrosis, unspecified: Secondary | ICD-10-CM | POA: Insufficient documentation

## 2021-07-24 DIAGNOSIS — G250 Essential tremor: Secondary | ICD-10-CM | POA: Insufficient documentation

## 2021-07-24 NOTE — Progress Notes (Signed)
Subjective: [name] is a pleasant 86 y.o. male patient seen today for painful elongated mycotic toenails 1-5 bilaterally which are tender when wearing enclosed shoe gear. Pain is relieved with periodic professional debridement.   He states his back is better now.  PCP is Biagio Borg, MD. Last visit was: 03/07/2021.  Allergies  Allergen Reactions   Morphine And Related Other (See Comments)    Migraine    Sulfa Antibiotics Rash and Itching    Patient  Is ok to use paper tape   Hydrochlorothiazide Other (See Comments)    REACTION: hyponatremia with daily use   Tape Itching    Patient  Is ok to use paper tape   Hytrin [Terazosin] Other (See Comments)    Dizziness   Prednisone Other (See Comments)    Caused problem with blood sugar   Latex Rash   Lisinopril Cough   Sulfur Dioxide Itching and Rash    Objective: Physical Exam  General: [name] is a pleasant 86 y.o. Caucasian male, WD, WN in NAD. AAO x 3.   Vascular:  Capillary refill time to digits immediate b/l. Palpable pedal pulses b/l LE. Pedal hair present. Lower extremity skin temperature gradient within normal limits. No edema noted b/l LE. No ischemia or gangrene noted b/l LE. No cyanosis or clubbing noted b/l LE.   Dermatological:  Pedal integument with normal turgor, texture and tone b/l LE. No open wounds b/l. No interdigital macerations b/l. Toenails 2-5 b/l elongated, thickened, discolored with subungual debris. +Tenderness with dorsal palpation of nailplates. Anonychia bilateral great toes. Nailbeds epithelialized. No hyperkeratotic nor porokeratotic lesions.   Musculoskeletal:  Muscle strength 5/5 to all lower extremity muscle groups bilaterally. No pain, crepitus or joint limitation noted with ROM bilateral LE. Utilizes rollator for ambulation assistance.   Neurological:  Protective sensation intact 5/5 intact bilaterally with 10g monofilament b/l. Vibratory sensation intact b/l.   Last A1c:    Hemoglobin A1C Latest Ref Rng & Units 10/24/2020  HGBA1C 4.6 - 6.5 % 6.1  Some recent data might be hidden   Assessment and Plan:  1. Pain due to onychomycosis of toenail     Patient was evaluated and treated and all questions answered. Consent given for treatment as described below: -Examined patient. -Mycotic toenails 1-5 bilaterally were debrided in length and girth with sterile nail nippers and dremel without incident. -Patient to report any pedal injuries to medical professional immediately. -Patient/POA to call should there be question/concern in the interim.  Return in about 3 months (around 10/22/2021).  Marzetta Board, DPM

## 2021-07-28 ENCOUNTER — Encounter: Payer: Self-pay | Admitting: Podiatry

## 2021-08-05 ENCOUNTER — Telehealth: Payer: Self-pay | Admitting: Internal Medicine

## 2021-08-05 NOTE — Telephone Encounter (Signed)
Connected to [facility] 1.21.2023.  -Caller states his dad tested positive for Covid last night (08/02/21) and has been experiencing a cough, chills, headache, nausea. No fever. UTD vaccinations. He is requesting a script for Paxlovid. Sx started yesterday. [facility] on [address] [phone].  Advised to see PCP within 24 hours.   Pt has received medication.

## 2021-10-09 ENCOUNTER — Encounter: Payer: Self-pay | Admitting: Podiatry

## 2021-10-09 ENCOUNTER — Ambulatory Visit (INDEPENDENT_AMBULATORY_CARE_PROVIDER_SITE_OTHER): Payer: Medicare Other | Admitting: Podiatry

## 2021-10-09 DIAGNOSIS — M79676 Pain in unspecified toe(s): Secondary | ICD-10-CM

## 2021-10-09 DIAGNOSIS — B351 Tinea unguium: Secondary | ICD-10-CM | POA: Diagnosis not present

## 2021-10-12 IMAGING — RF DG THORACOLUMBAR SPINE 2V
1 series · 1 of 1 positions shown · non-contrast
Comparison: CT 10/13/2020

CLINICAL DATA: Kyphoplasty

EXAM:
THORACOLUMBAR SPINE 1V

[Series 1: run · 1 of 1 slices shown]
[im 1/1]
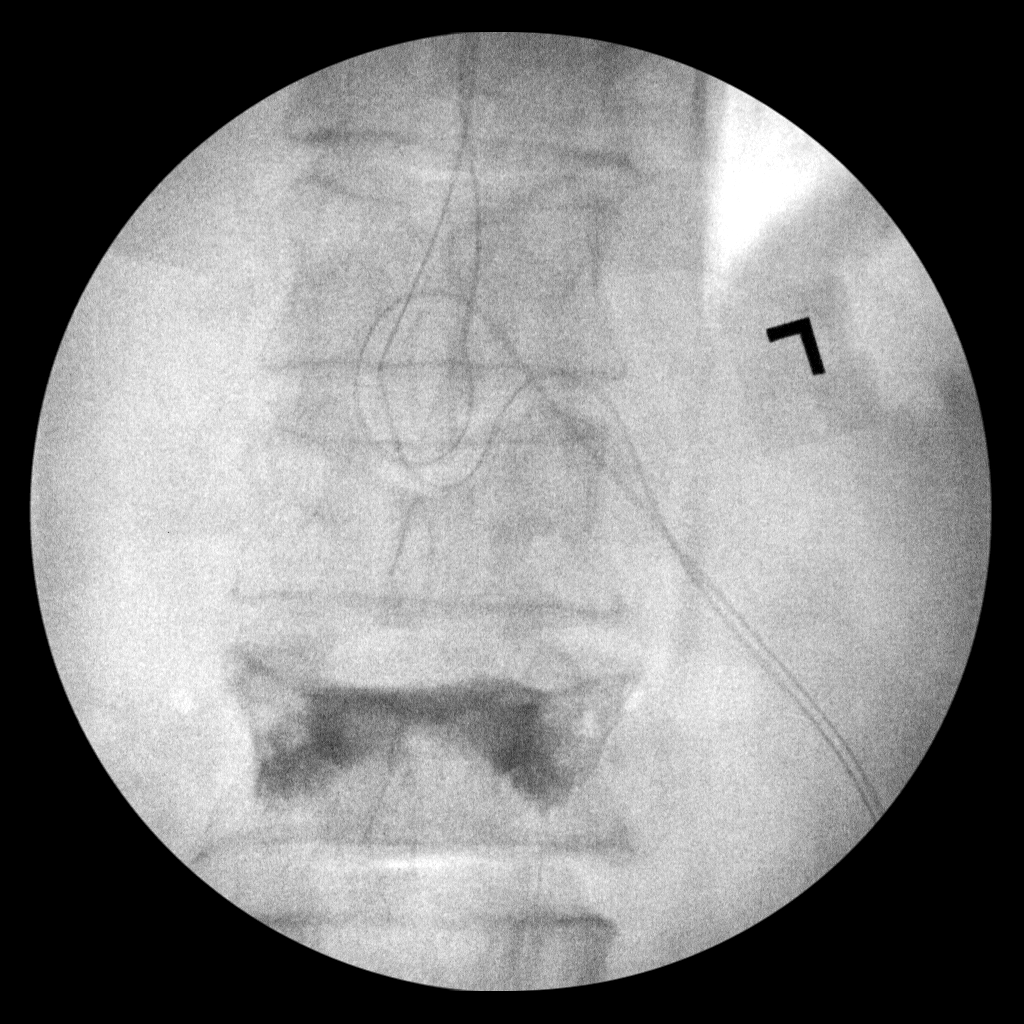

[1 of 1 positions shown; findings below may reference images not displayed]

FINDINGS: Two C-arm images show previous vertebral augmentation at the L1
level. Methylmethacrylate appears well distributed. No complicating
feature.
IMPRESSION: Kyphoplasty at L1.

## 2021-10-13 NOTE — Progress Notes (Signed)
?  Subjective:  ?Patient ID: [name], male    DOB: 1935-09-05,  MRN: [id number] ? ?HEZEKIAH VELTRE presents to clinic today for painful thick toenails that are difficult to trim. Pain interferes with ambulation. Aggravating factors include wearing enclosed shoe gear. Pain is relieved with periodic professional debridement. ? ?New problem(s): None.  ? ?PCP is Biagio Borg, MD , and last visit was April 12, 2021. ? ?Allergies  ?Allergen Reactions  ? Morphine And Related Other (See Comments)  ?  Migraine   ? Sulfa Antibiotics Rash and Itching  ?  Patient  Is ok to use paper tape  ? Hydrochlorothiazide Other (See Comments)  ?  REACTION: hyponatremia with daily use  ? Tape Itching  ?  Patient  Is ok to use paper tape  ? Hytrin [Terazosin] Other (See Comments)  ?  Dizziness  ? Prednisone Other (See Comments)  ?  Caused problem with blood sugar  ? Latex Rash  ? Lisinopril Cough  ? Sulfur Dioxide Itching and Rash  ? ? ?Review of Systems: Negative except as noted in the HPI. ? ?Objective: No changes noted in today's physical examination. ? ?General: ORACIO GALEN is a pleasant 86 y.o. Caucasian male, WD, WN in NAD. AAO x 3.  ? ?Vascular:  ?Capillary refill time to digits immediate b/l. Palpable pedal pulses b/l LE. Pedal hair present. Lower extremity skin temperature gradient within normal limits. No edema noted b/l LE. No ischemia or gangrene noted b/l LE. No cyanosis or clubbing noted b/l LE.  ? ?Dermatological:  ?Pedal integument with normal turgor, texture and tone b/l LE. No open wounds b/l. No interdigital macerations b/l. Toenails 2-5 b/l elongated, thickened, discolored with subungual debris. +Tenderness with dorsal palpation of nailplates. Anonychia bilateral great toes. Nailbeds epithelialized. No hyperkeratotic nor porokeratotic lesions.  ? ?Musculoskeletal:  ?Muscle strength 5/5 to all lower extremity muscle groups bilaterally. No pain, crepitus or joint limitation noted with ROM bilateral LE. Utilizes  rollator for ambulation assistance.  ? ?Neurological:  ?Protective sensation intact 5/5 intact bilaterally with 10g monofilament b/l. Vibratory sensation intact b/l.  ? ? ?  Latest Ref Rng & Units 10/24/2020  ?  4:03 PM  ?Hemoglobin A1C  ?Hemoglobin-A1c 4.6 - 6.5 % 6.1    ? ?Assessment/Plan: ?1. Pain due to onychomycosis of toenail   ?-No new findings. No new orders. ?-Patient to continue soft, supportive shoe gear daily. ?-Toenails 1-5 b/l were debrided in length and girth with sterile nail nippers and dremel without iatrogenic bleeding.  ?-Patient/POA to call should there be question/concern in the interim.  ? ?Return in about 3 months (around 01/09/2022). ? ?Marzetta Board, DPM  ?

## 2021-10-21 ENCOUNTER — Other Ambulatory Visit: Payer: Self-pay | Admitting: Internal Medicine

## 2021-11-11 ENCOUNTER — Other Ambulatory Visit: Payer: Self-pay | Admitting: Internal Medicine

## 2021-11-11 NOTE — Telephone Encounter (Signed)
Please refill as per office routine med refill policy (all routine meds to be refilled for 3 mo or monthly (per pt preference) up to one year from last visit, then month to month grace period for 3 mo, then further med refills will have to be denied) ? ?

## 2021-12-11 ENCOUNTER — Ambulatory Visit (INDEPENDENT_AMBULATORY_CARE_PROVIDER_SITE_OTHER): Payer: Medicare Other | Admitting: Internal Medicine

## 2021-12-11 VITALS — BP 120/76 | HR 63 | Temp 98.4°F | Ht 66.0 in | Wt 204.0 lb

## 2021-12-11 DIAGNOSIS — I10 Essential (primary) hypertension: Secondary | ICD-10-CM

## 2021-12-11 DIAGNOSIS — G8929 Other chronic pain: Secondary | ICD-10-CM | POA: Diagnosis not present

## 2021-12-11 DIAGNOSIS — E559 Vitamin D deficiency, unspecified: Secondary | ICD-10-CM | POA: Diagnosis not present

## 2021-12-11 DIAGNOSIS — E538 Deficiency of other specified B group vitamins: Secondary | ICD-10-CM | POA: Diagnosis not present

## 2021-12-11 DIAGNOSIS — E782 Mixed hyperlipidemia: Secondary | ICD-10-CM | POA: Diagnosis not present

## 2021-12-11 DIAGNOSIS — N1831 Chronic kidney disease, stage 3a: Secondary | ICD-10-CM

## 2021-12-11 DIAGNOSIS — R7302 Impaired glucose tolerance (oral): Secondary | ICD-10-CM

## 2021-12-11 LAB — HEPATIC FUNCTION PANEL
ALT: 13 U/L (ref 0–53)
AST: 16 U/L (ref 0–37)
Albumin: 4.4 g/dL (ref 3.5–5.2)
Alkaline Phosphatase: 94 U/L (ref 39–117)
Bilirubin, Direct: 0.1 mg/dL (ref 0.0–0.3)
Total Bilirubin: 0.5 mg/dL (ref 0.2–1.2)
Total Protein: 7.2 g/dL (ref 6.0–8.3)

## 2021-12-11 LAB — VITAMIN B12: Vitamin B-12: 767 pg/mL (ref 211–911)

## 2021-12-11 LAB — CBC WITH DIFFERENTIAL/PLATELET
Basophils Absolute: 0 10*3/uL (ref 0.0–0.1)
Basophils Relative: 0.4 % (ref 0.0–3.0)
Eosinophils Absolute: 0.2 10*3/uL (ref 0.0–0.7)
Eosinophils Relative: 2.4 % (ref 0.0–5.0)
HCT: 43 % (ref 39.0–52.0)
Hemoglobin: 14.1 g/dL (ref 13.0–17.0)
Lymphocytes Relative: 21.1 % (ref 12.0–46.0)
Lymphs Abs: 1.6 10*3/uL (ref 0.7–4.0)
MCHC: 32.7 g/dL (ref 30.0–36.0)
MCV: 83.8 fl (ref 78.0–100.0)
Monocytes Absolute: 0.7 10*3/uL (ref 0.1–1.0)
Monocytes Relative: 9 % (ref 3.0–12.0)
Neutro Abs: 5 10*3/uL (ref 1.4–7.7)
Neutrophils Relative %: 67.1 % (ref 43.0–77.0)
Platelets: 237 10*3/uL (ref 150.0–400.0)
RBC: 5.13 Mil/uL (ref 4.22–5.81)
RDW: 14.8 % (ref 11.5–15.5)
WBC: 7.4 10*3/uL (ref 4.0–10.5)

## 2021-12-11 LAB — BASIC METABOLIC PANEL
BUN: 18 mg/dL (ref 6–23)
CO2: 28 mEq/L (ref 19–32)
Calcium: 9.4 mg/dL (ref 8.4–10.5)
Chloride: 101 mEq/L (ref 96–112)
Creatinine, Ser: 0.92 mg/dL (ref 0.40–1.50)
GFR: 76.44 mL/min (ref >60.00)
Glucose, Bld: 84 mg/dL (ref 70–99)
Potassium: 4.2 mEq/L (ref 3.5–5.1)
Sodium: 137 mEq/L (ref 135–145)

## 2021-12-11 LAB — LIPID PANEL
Cholesterol: 157 mg/dL (ref 0–200)
HDL: 34 mg/dL — ABNORMAL LOW (ref >39.00)
LDL Cholesterol: 85 mg/dL (ref 0–99)
NonHDL: 122.59
Total CHOL/HDL Ratio: 5
Triglycerides: 186 mg/dL — ABNORMAL HIGH (ref 0.0–149.0)
VLDL: 37.2 mg/dL (ref 0.0–40.0)

## 2021-12-11 LAB — HEMOGLOBIN A1C: Hgb A1c MFr Bld: 5.6 % (ref 4.6–6.5)

## 2021-12-11 LAB — VITAMIN D 25 HYDROXY (VIT D DEFICIENCY, FRACTURES): VITD: 32.49 ng/mL (ref 30.00–100.00)

## 2021-12-11 LAB — TSH: TSH: 2.58 u[IU]/mL (ref 0.35–5.50)

## 2021-12-11 MED ORDER — TRAMADOL HCL 50 MG PO TABS: 50.0000 mg | ORAL_TABLET | Freq: Four times a day (QID) | ORAL | 0 refills | Status: DC | PRN

## 2021-12-11 MED ORDER — GABAPENTIN 300 MG PO CAPS: 900.0000 mg | ORAL_CAPSULE | Freq: Every day | ORAL | 1 refills | Status: DC

## 2021-12-11 NOTE — Patient Instructions (Addendum)
Please have the Tdap done at your next [place] visit  Please take all new medication as prescribed - the tramadol pain medicine (but call for a new rx next wk if needed)  Please continue all other medications as before, and refills have been done if requested.  Please have the pharmacy call with any other refills you may need.  Please continue your efforts at being more active, low cholesterol diet, and weight control.  You are otherwise up to date with prevention measures today.  Please keep your appointments with your specialists as you may have planned  Please go to the LAB at the blood drawing area for the tests to be done  You will be contacted by phone if any changes need to be made immediately.  Otherwise, you will receive a letter about your results with an explanation, but please check with MyChart first.  Please remember to sign up for MyChart if you have not done so, as this will be important to you in the future with finding out test results, communicating by private email, and scheduling acute appointments online when needed.  Please make an Appointment to return in 6 months, or sooner if needed

## 2021-12-11 NOTE — Progress Notes (Unsigned)
Patient ID: [name], male   DOB: 11/14/1935, 86 y.o.   MRN: [id number]        Chief Complaint: follow up chronic pain with son       HPI:  [name] is a 86 y.o. male here with persistent OA pain of the hands but RA pain much improved recently; also Pt continues to have recurring LBP without change in severity, bowel or bladder change, fever, wt loss,  worsening LE pain/numbness/weakness, gait change or falls.  Takes up to 1200 mg gabapentin qhs but still has significant pain during daytime as well.   Has known lumbar spinal stenosis followed per dr Nelva Bush.  Pt denies chest pain, increased sob or doe, wheezing, orthopnea, PND, increased LE swelling, palpitations, dizziness or syncope.   Pt denies polydipsia, polyuria, or new focal neuro s/s.          Wt Readings from Last 3 Encounters:  12/11/21 204 lb (92.5 kg)  05/01/21 211 lb 3.2 oz (95.8 kg)  03/28/21 200 lb (90.7 kg)   BP Readings from Last 3 Encounters:  12/11/21 120/76  05/01/21 100/65  03/28/21 122/65         Past Medical History:  Diagnosis Date   Acute bronchitis per pt no fever--  cough since discharge from hospital 07-02-2015   per pcp note 07-05-2015  mild to moderate bronchitis vs pna   Allergic rhinitis    Bladder cancer (HCC)    BPH (benign prostatic hyperplasia)    Bradycardia    CAD (coronary artery disease)    CHF (congestive heart failure) (HCC)    Chronic low back pain    COPD (chronic obstructive pulmonary disease) (HCC)    DDD (degenerative disc disease), lumbosacral    Depression    Diverticulosis of colon (without mention of hemorrhage)    Dyspnea    Essential tremor    Fatty liver disease, nonalcoholic    GERD (gastroesophageal reflux disease)    Glaucoma    History of bladder cancer    2004--  TCC low-grade , non-invasive   History of colon polyps    History of exercise stress test    02-05-2007-- ETT (Clinically positive, electrically equivocal, submaximal ETT,  appropriate bp response to  exercise   History of hiatal hernia    History of kidney stones    History of peptic ulcer    HLD (hyperlipidemia)    HTN (hypertension)    Nocturia    Pneumonia    Productive cough    Rheumatoid arthritis(714.0)    Right renal artery stenosis ([place])    per cath 03-14-2009 --- 40-50%   RLS (restless legs syndrome)    Stroke (HCC)    TIA   Vertigo, intermittent    Weak urinary stream    Wears dentures    upper   Wears glasses    Wears hearing aid    bilateral   Wears partial dentures    lower   Past Surgical History:  Procedure Laterality Date   CARDIAC CATHETERIZATION  02-12-2007  dr gregg taylor   Non-obstructive CAD,  20% pLCX,  20% LAD, ef 65%   CARDIAC CATHETERIZATION  03-14-2009  dr Johnsie Cancel   pLAD and mid diaginal 20-30% multiple lesions,  OM1 20%, right renal ostial 40-50%,  ef 60%   CATARACT EXTRACTION W/ INTRAOCULAR LENS  IMPLANT, BILATERAL  07/14/2014   CYSTOSCOPY WITH BIOPSY N/A 07/11/2015   Procedure: CYSTOSCOPY WITH COLD CUP RESECTION AND FULGERATION;  Surgeon: Shanon Brow  Risa Grill, MD;  Location: [facility];  Service: Urology;  Laterality: N/A;   HIP ARTHROSCOPY W/ LABRAL DEBRIDEMENT Left 06/29/2000   and chondraplasty   JOINT REPLACEMENT     right big toe replacement   KYPHOPLASTY N/A 03/28/2021   Procedure: KYPHOPLASTY lumbar one;  Surgeon: Melina Schools, MD;  Location: [place];  Service: Orthopedics;  Laterality: N/A;   LUMBAR [place] SURGERY  07/16/2005   LUMBAR LAMINECTOMY/DECOMPRESSION MICRODISCECTOMY  07/09/2011   Procedure: LUMBAR LAMINECTOMY/DECOMPRESSION MICRODISCECTOMY;  Surgeon: Johnn Hai;  Location: [facility];  Service: Orthopedics;  Laterality: Left;  Decompression L5 - S1 on the Left and repair of dura   LUMBAR LAMINECTOMY/DECOMPRESSION MICRODISCECTOMY N/A 07/27/2013   Procedure: DECOMPRESSION L4 - L5 WITH EXCISION OF SYNOVIAL CYST AND, LATERAL MASS FUSION 1 LEVEL;  Surgeon: Johnn Hai, MD;  Location: [facility];  Service: Orthopedics;   Laterality: N/A;   LUMBAR LAMINECTOMY/DECOMPRESSION MICRODISCECTOMY Left 07/06/2014   Procedure:  MICRO LUMBAR DECOMPRESSION L5-S1 ON LEFT, L4-5 REDO;  Surgeon: Johnn Hai, MD;  Location: [facility];  Service: Orthopedics;  Laterality: Left;   SPINAL CORD STIMULATOR BATTERY EXCHANGE N/A 03/28/2021   Procedure: SPINAL CORD STIMULATOR BATTERY EXCHANGE/REPLACEMENT, L1 KYPHOPLASTY;  Surgeon: Melina Schools, MD;  Location: [place];  Service: Orthopedics;  Laterality: N/A;  90 MINS LOCAL WITH IV REG   SPINAL CORD STIMULATOR INSERTION N/A 09/11/2016   Procedure: Spinal cord stimulator placement;  Surgeon: Melina Schools, MD;  Location: [place];  Service: Orthopedics;  Laterality: N/A;   TONSILLECTOMY AND ADENOIDECTOMY     TRANSTHORACIC ECHOCARDIOGRAM  09/20/2010   normal LVF, ef 55-65%/  mild MR, TR and PR/  mild LAE   TRANSURETHRAL RESECTION OF BLADDER TUMOR  09/21/2002    reports that he quit smoking about 39 years ago. His smoking use included cigarettes. He has a 40.00 pack-year smoking history. He has never used smokeless tobacco. He reports that he does not currently use alcohol. He reports that he does not use drugs. family history includes Cancer in his brother, father, and sister; Diabetes in his sister; Heart attack in his mother. Allergies  Allergen Reactions   Morphine And Related Other (See Comments)    Migraine    Sulfa Antibiotics Rash and Itching    Patient  Is ok to use paper tape   Hydrochlorothiazide Other (See Comments)    REACTION: hyponatremia with daily use   Tape Itching    Patient  Is ok to use paper tape   Hytrin [Terazosin] Other (See Comments)    Dizziness   Prednisone Other (See Comments)    Caused problem with blood sugar   Latex Rash   Lisinopril Cough   Sulfur Dioxide Itching and Rash   Current Outpatient Medications on File Prior to Visit  Medication Sig Dispense Refill   acetaminophen (TYLENOL) 650 MG CR tablet Take 1,300 mg by mouth every 8 (eight) hours as  needed for pain.     albuterol (PROVENTIL) (2.5 MG/3ML) 0.083% nebulizer solution Take 3 mLs (2.5 mg total) by nebulization every 6 (six) hours as needed for wheezing or shortness of breath. 150 mL 1   albuterol (VENTOLIN HFA) 108 (90 Base) MCG/ACT inhaler Inhale 2 puffs into the lungs every 6 (six) hours as needed for wheezing or shortness of breath. 18 g 11   amLODipine (NORVASC) 10 MG tablet TAKE 1 TABLET EVERY DAY 90 tablet 0   benzonatate (TESSALON) 100 MG capsule TAKE 1 CAPSULE(100 MG) BY MOUTH THREE TIMES DAILY  AS NEEDED FOR COUGH 90 capsule 1   brimonidine (ALPHAGAN) 0.15 % ophthalmic solution Place 1 drop into both eyes 2 (two) times daily.     cetirizine (ZYRTEC) 10 MG tablet Take 10 mg by mouth daily.     citalopram (CELEXA) 20 MG tablet Take 1 tablet (20 mg total) by mouth daily. (Patient taking differently: Take 10 mg by mouth daily.) 90 tablet 2   clonazePAM (KLONOPIN) 0.5 MG tablet TAKE 1 TABLET(0.5 MG) BY MOUTH TWICE DAILY AS NEEDED FOR ANXIETY 60 tablet 2   clotrimazole (LOTRIMIN) 1 % cream Apply 1 application topically 2 (two) times daily as needed (FOR RASH). Prescribed for rash in groin     dextromethorphan-guaiFENesin (MUCINEX DM) 30-600 MG 12hr tablet Take 1 tablet by mouth 2 (two) times daily.     fluticasone (FLONASE) 50 MCG/ACT nasal spray USE 2 SPRAYS IN EACH NOSTRIL DAILY 48 g 1   furosemide (LASIX) 20 MG tablet TAKE 1 TABLET BY MOUTH TWICE A WEEK; ON TUESDAY AND SATURDAY 24 tablet 1   hydrALAZINE (APRESOLINE) 10 MG tablet TAKE 1 TABLET THREE TIMES DAILY AS NEEDED FOR BLOOD PRESSURE MORE THAN 150/90 (Patient taking differently: Take 10 mg by mouth 3 (three) times daily.) 270 tablet 1   hydroxypropyl methylcellulose / hypromellose (ISOPTO TEARS / GONIOVISC) 2.5 % ophthalmic solution Place 1 drop into both eyes 2 (two) times daily.     Insulin Pen Needle 29G X 10MM MISC Use as directed 200 each 0   LORazepam (ATIVAN) 1 MG tablet Take 1 mg by mouth daily as needed for anxiety.      losartan (COZAAR) 100 MG tablet Take 1 tablet (100 mg total) by mouth daily. 90 tablet 2   meclizine (ANTIVERT) 25 MG tablet TAKE 1 TABLET TWICE DAILY AS NEEDED FOR DIZZINESS (Patient taking differently: Take 25 mg by mouth 2 (two) times daily.) 180 tablet 2   methocarbamol (ROBAXIN) 500 MG tablet Take 500 mg by mouth 3 (three) times daily as needed for muscle spasms.     nitroGLYCERIN (NITROSTAT) 0.4 MG SL tablet PLACE 1 TABLET UNDER THE TONGUE EVERY 5 MINUTES AS NEEDED FOR CHEST PAIN. MAX 3 DOSES. CALL 911 IF NO IMPROVEMENT AFTER 1ST DOSE 25 tablet 0   omeprazole (PRILOSEC) 40 MG capsule TAKE 1 CAPSULE TWICE DAILY 180 capsule 3   potassium chloride (KLOR-CON) 10 MEQ tablet Take one tablet daily for 1 week then take one tablet on on tuesdays and saturdays. 30 tablet 1   primidone (MYSOLINE) 250 MG tablet TAKE 1 TABLET (250 MG TOTAL) BY MOUTH 4 (FOUR) TIMES DAILY. (Patient taking differently: Take 125 mg by mouth 3 (three) times daily.) 360 tablet 3   simvastatin (ZOCOR) 40 MG tablet TAKE 1 AND 1/2 TABLETS AT BEDTIME 135 tablet 1   tamsulosin (FLOMAX) 0.4 MG CAPS capsule Take 1 capsule (0.4 mg total) by mouth daily. 90 capsule 3   timolol (TIMOPTIC) 0.5 % ophthalmic solution Place 1 drop into both eyes 2 (two) times daily.   1   Tiotropium Bromide Monohydrate (SPIRIVA RESPIMAT) 2.5 MCG/ACT AERS Inhale 2.5 mcg into the lungs daily.     Travoprost, BAK Free, (TRAVATAN Z) 0.004 % SOLN ophthalmic solution Place 1 drop into both eyes at bedtime. 5 mL 5   triamcinolone (KENALOG) 0.025 % cream Apply 1 application topically 2 (two) times daily as needed (rash).      vitamin B-12 (CYANOCOBALAMIN) 500 MCG tablet Take 500 mcg by mouth daily.     insulin  aspart (NOVOLOG FLEXPEN) 100 UNIT/ML FlexPen Novolog FlexPen U-100 Insulin aspart 100 unit/mL (3 mL) subcutaneous  INJECT 4 UNITS UNDER THE SKIN THREE TIMES DAILY WITH MEALS     [DISCONTINUED] Insulin Detemir (LEVEMIR) 100 UNIT/ML Pen Inject 5 Units into  the skin 2 (two) times daily. (Patient not taking: Reported on 01/19/2020) 6 mL 0   [DISCONTINUED] ipratropium-albuterol (DUONEB) 0.5-2.5 (3) MG/3ML SOLN Take 3 mLs by nebulization every 6 (six) hours as needed. (Patient not taking: Reported on 01/19/2020) 360 mL 0   No current facility-administered medications on file prior to visit.        ROS:  All others reviewed and negative.  Objective        PE:  BP 120/76 (BP Location: Left Arm, Patient Position: Sitting, Cuff Size: Large)   Pulse 63   Temp 98.4 F (36.9 C) (Oral)   Ht '5\' 6"'$  (1.676 m)   Wt 204 lb (92.5 kg)   SpO2 96%   BMI 32.93 kg/m                 Constitutional: Pt appears in NAD               HENT: Head: NCAT.                Right Ear: External ear normal.                 Left Ear: External ear normal.                Eyes: . Pupils are equal, round, and reactive to light. Conjunctivae and EOM are normal               Nose: without d/c or deformity               Neck: Neck supple. Gross normal ROM               Cardiovascular: Normal rate and regular rhythm.                 Pulmonary/Chest: Effort normal and breath sounds without rales or wheezing.                Abd:  Soft, NT, ND, + BS, no organomegaly               Hand OA changes without soft tissue swelling               Neurological: Pt is alert. At baseline orientation, motor grossly intact               Skin: Skin is warm. No rashes, no other new lesions, LE edema - none               Psychiatric: Pt behavior is normal without agitation   Micro: none  Cardiac tracings I have personally interpreted today:  none  Pertinent Radiological findings (summarize): none   Lab Results  Component Value Date   WBC 7.4 12/11/2021   HGB 14.1 12/11/2021   HCT 43.0 12/11/2021   PLT 237.0 12/11/2021   GLUCOSE 84 12/11/2021   CHOL 157 12/11/2021   TRIG 186.0 (H) 12/11/2021   HDL 34.00 (L) 12/11/2021   LDLDIRECT 86.0 06/24/2019   LDLCALC 85 12/11/2021   ALT 13  12/11/2021   AST 16 12/11/2021   NA 137 12/11/2021   K 4.2 12/11/2021   CL 101 12/11/2021   CREATININE 0.92 12/11/2021   BUN 18 12/11/2021  CO2 28 12/11/2021   TSH 2.58 12/11/2021   PSA 0.53 11/12/2016   INR 1.1 03/28/2021   HGBA1C 5.6 12/11/2021   Assessment/Plan:  [name] is a 86 y.o. White or Caucasian [1] male with  has a past medical history of Acute bronchitis (per pt no fever--  cough since discharge from hospital 07-02-2015), Allergic rhinitis, Bladder cancer ([place]), BPH (benign prostatic hyperplasia), Bradycardia, CAD (coronary artery disease), CHF (congestive heart failure) ([place]), Chronic low back pain, COPD (chronic obstructive pulmonary disease) ([place]), DDD (degenerative disc disease), lumbosacral, Depression, Diverticulosis of colon (without mention of hemorrhage), Dyspnea, Essential tremor, Fatty liver disease, nonalcoholic, GERD (gastroesophageal reflux disease), Glaucoma, History of bladder cancer, History of colon polyps, History of exercise stress test, History of hiatal hernia, History of kidney stones, History of peptic ulcer, HLD (hyperlipidemia), HTN (hypertension), Nocturia, Pneumonia, Productive cough, Rheumatoid arthritis(714.0), Right renal artery stenosis (HCC), RLS (restless legs syndrome), Stroke ([place]), Vertigo, intermittent, Weak urinary stream, Wears dentures, Wears glasses, Wears hearing aid, and Wears partial dentures.  Chronic pain Primarily lower back; for tamadol prn, cont gabapentin and Dr Nelva Bush with cortisone injs  Essential hypertension BP Readings from Last 3 Encounters:  12/11/21 120/76  05/01/21 100/65  03/28/21 122/65   Stable, pt to continue medical treatment hydralazine 10 tid, norvasc 10 qd, losartan 100   CKD (chronic kidney disease) stage 3, GFR 30-59 ml/min Lab Results  Component Value Date   CREATININE 0.92 12/11/2021   Stable overall, cont to avoid nephrotoxins, and for f/u labs today  Followup: Return in about 6 months (around  06/12/2022).  Cathlean Cower, MD 12/12/2021 8:34 PM [facility]

## 2021-12-12 ENCOUNTER — Encounter: Payer: Self-pay | Admitting: Internal Medicine

## 2021-12-12 LAB — URINALYSIS, ROUTINE W REFLEX MICROSCOPIC
Bilirubin Urine: NEGATIVE
Hgb urine dipstick: NEGATIVE
Ketones, ur: NEGATIVE
Leukocytes,Ua: NEGATIVE
Nitrite: NEGATIVE
RBC / HPF: NONE SEEN (ref 0–?)
Specific Gravity, Urine: 1.02 (ref 1.000–1.030)
Total Protein, Urine: NEGATIVE
Urine Glucose: NEGATIVE
Urobilinogen, UA: 0.2 (ref 0.0–1.0)
pH: 6 (ref 5.0–8.0)

## 2021-12-12 NOTE — Assessment & Plan Note (Signed)
Primarily lower back; for tamadol prn, cont gabapentin and Dr Nelva Bush with cortisone injs

## 2021-12-12 NOTE — Assessment & Plan Note (Signed)
BP Readings from Last 3 Encounters:  12/11/21 120/76  05/01/21 100/65  03/28/21 122/65   Stable, pt to continue medical treatment hydralazine 10 tid, norvasc 10 qd, losartan 100

## 2021-12-12 NOTE — Assessment & Plan Note (Addendum)
Lab Results  Component Value Date   CREATININE 0.92 12/11/2021   Stable overall, cont to avoid nephrotoxins, and for f/u labs today

## 2021-12-17 ENCOUNTER — Telehealth: Payer: Self-pay | Admitting: *Deleted

## 2021-12-17 ENCOUNTER — Telehealth: Payer: Medicare Other

## 2021-12-17 ENCOUNTER — Encounter: Payer: Self-pay | Admitting: *Deleted

## 2021-12-17 NOTE — Telephone Encounter (Signed)
  Chronic Care Management   Follow Up Note   12/17/2021 Name: [name] MRN: [id number] DOB: [birth year]  Referred by: Biagio Borg, MD Reason for referral : Chronic Care Management (CCM RN CM Follow Up telephone Outreach- Unsuccessful attempt)  An unsuccessful telephone outreach was attempted today. The patient was referred to the case management team for assistance with care management and care coordination.   Follow Up Plan:  A HIPPA compliant phone message was left for the patient's caregiver/ son [name] providing contact information and requesting a return call Will place request with scheduling care guide to contact patient to re-schedule today's missed CCM RN follow up telephone appointment if I do not hear back from patient by end of day  Oneta Rack, RN, BSN, [place] [phone]: direct office

## 2021-12-23 ENCOUNTER — Telehealth: Payer: Self-pay | Admitting: Internal Medicine

## 2021-12-23 NOTE — Telephone Encounter (Signed)
Patient needs his tramadol sent in to [place]   -

## 2021-12-24 MED ORDER — TRAMADOL HCL 50 MG PO TABS: 50.0000 mg | ORAL_TABLET | Freq: Four times a day (QID) | ORAL | 2 refills | Status: DC | PRN

## 2021-12-24 NOTE — Telephone Encounter (Signed)
Very sorry, tramadol is not normally sent to the [facility] pharmacy due to being a controlled substance and the large number of pills involved  I can send smaller amounts to the local pharmacy - done erx

## 2021-12-24 NOTE — Telephone Encounter (Signed)
See below  - to inform pt please

## 2021-12-26 NOTE — Telephone Encounter (Signed)
Patient's son notified.

## 2022-01-13 ENCOUNTER — Telehealth: Payer: Self-pay | Admitting: Cardiology

## 2022-01-13 ENCOUNTER — Ambulatory Visit (INDEPENDENT_AMBULATORY_CARE_PROVIDER_SITE_OTHER): Payer: Medicare Other | Admitting: *Deleted

## 2022-01-13 DIAGNOSIS — I10 Essential (primary) hypertension: Secondary | ICD-10-CM

## 2022-01-13 DIAGNOSIS — I509 Heart failure, unspecified: Secondary | ICD-10-CM

## 2022-01-13 DIAGNOSIS — G8929 Other chronic pain: Secondary | ICD-10-CM

## 2022-01-13 NOTE — Chronic Care Management (AMB) (Signed)
Chronic Care Management   CCM RN Visit Note  01/13/2022 Name: [name] MRN: [id number] DOB: [birth year]  Subjective: [name] is a 86 y.o. year old male who is a primary care patient of Casey Borg, MD. The care management team was consulted for assistance with disease management and care coordination needs.    Engaged with patient by telephone for follow up visit/ CCM RN CM Case Closure in response to provider referral for case management and/or care coordination services.   Consent to Services:  The patient was given information about Chronic Care Management services, agreed to services, and gave verbal consent prior to initiation of services.  Please see initial visit note for detailed documentation.  Patient agreed to services and verbal consent obtained.   Assessment: Review of patient past medical history, allergies, medications, health status, including review of consultants reports, laboratory and other test data, was performed as part of comprehensive evaluation and provision of chronic care management services.   SDOH (Social Determinants of Health) assessments and interventions performed:  SDOH Interventions    Flowsheet Row Most Recent Value  SDOH Interventions   Food Insecurity Interventions Intervention Not Indicated  [continues to deny food insecurity]  Transportation Interventions Intervention Not Indicated  [caregiver/ son continues to provide transportation]      CCM Care Plan  Allergies  Allergen Reactions   Morphine And Related Other (See Comments)    Migraine    Sulfa Antibiotics Rash and Itching    Patient  Is ok to use paper tape   Hydrochlorothiazide Other (See Comments)    REACTION: hyponatremia with daily use   Tape Itching    Patient  Is ok to use paper tape   Hytrin [Terazosin] Other (See Comments)    Dizziness   Prednisone Other (See Comments)    Caused problem with blood sugar   Latex Rash   Lisinopril Cough   Sulfur Dioxide Itching and  Rash   Outpatient Encounter Medications as of 01/13/2022  Medication Sig   acetaminophen (TYLENOL) 650 MG CR tablet Take 1,300 mg by mouth every 8 (eight) hours as needed for pain.   albuterol (PROVENTIL) (2.5 MG/3ML) 0.083% nebulizer solution Take 3 mLs (2.5 mg total) by nebulization every 6 (six) hours as needed for wheezing or shortness of breath.   albuterol (VENTOLIN HFA) 108 (90 Base) MCG/ACT inhaler Inhale 2 puffs into the lungs every 6 (six) hours as needed for wheezing or shortness of breath.   amLODipine (NORVASC) 10 MG tablet TAKE 1 TABLET EVERY DAY   benzonatate (TESSALON) 100 MG capsule TAKE 1 CAPSULE(100 MG) BY MOUTH THREE TIMES DAILY AS NEEDED FOR COUGH   brimonidine (ALPHAGAN) 0.15 % ophthalmic solution Place 1 drop into both eyes 2 (two) times daily.   cetirizine (ZYRTEC) 10 MG tablet Take 10 mg by mouth daily.   citalopram (CELEXA) 20 MG tablet Take 1 tablet (20 mg total) by mouth daily. (Patient taking differently: Take 10 mg by mouth daily.)   clonazePAM (KLONOPIN) 0.5 MG tablet TAKE 1 TABLET(0.5 MG) BY MOUTH TWICE DAILY AS NEEDED FOR ANXIETY   clotrimazole (LOTRIMIN) 1 % cream Apply 1 application topically 2 (two) times daily as needed (FOR RASH). Prescribed for rash in groin   dextromethorphan-guaiFENesin (MUCINEX DM) 30-600 MG 12hr tablet Take 1 tablet by mouth 2 (two) times daily.   fluticasone (FLONASE) 50 MCG/ACT nasal spray USE 2 SPRAYS IN EACH NOSTRIL DAILY   furosemide (LASIX) 20 MG tablet TAKE 1 TABLET BY MOUTH  TWICE A WEEK; ON TUESDAY AND SATURDAY   gabapentin (NEURONTIN) 300 MG capsule Take 3-4 capsules (900-1,200 mg total) by mouth at bedtime.   hydrALAZINE (APRESOLINE) 10 MG tablet TAKE 1 TABLET THREE TIMES DAILY AS NEEDED FOR BLOOD PRESSURE MORE THAN 150/90 (Patient taking differently: Take 10 mg by mouth 3 (three) times daily.)   hydroxypropyl methylcellulose / hypromellose (ISOPTO TEARS / GONIOVISC) 2.5 % ophthalmic solution Place 1 drop into both eyes 2 (two)  times daily.   insulin aspart (NOVOLOG FLEXPEN) 100 UNIT/ML FlexPen Novolog FlexPen U-100 Insulin aspart 100 unit/mL (3 mL) subcutaneous  INJECT 4 UNITS UNDER THE SKIN THREE TIMES DAILY WITH MEALS   Insulin Pen Needle 29G X 10MM MISC Use as directed   LORazepam (ATIVAN) 1 MG tablet Take 1 mg by mouth daily as needed for anxiety.   losartan (COZAAR) 100 MG tablet Take 1 tablet (100 mg total) by mouth daily.   meclizine (ANTIVERT) 25 MG tablet TAKE 1 TABLET TWICE DAILY AS NEEDED FOR DIZZINESS (Patient taking differently: Take 25 mg by mouth 2 (two) times daily.)   methocarbamol (ROBAXIN) 500 MG tablet Take 500 mg by mouth 3 (three) times daily as needed for muscle spasms.   nitroGLYCERIN (NITROSTAT) 0.4 MG SL tablet PLACE 1 TABLET UNDER THE TONGUE EVERY 5 MINUTES AS NEEDED FOR CHEST PAIN. MAX 3 DOSES. CALL 911 IF NO IMPROVEMENT AFTER 1ST DOSE   omeprazole (PRILOSEC) 40 MG capsule TAKE 1 CAPSULE TWICE DAILY   potassium chloride (KLOR-CON) 10 MEQ tablet Take one tablet daily for 1 week then take one tablet on on tuesdays and saturdays.   primidone (MYSOLINE) 250 MG tablet TAKE 1 TABLET (250 MG TOTAL) BY MOUTH 4 (FOUR) TIMES DAILY. (Patient taking differently: Take 125 mg by mouth 3 (three) times daily.)   simvastatin (ZOCOR) 40 MG tablet TAKE 1 AND 1/2 TABLETS AT BEDTIME   tamsulosin (FLOMAX) 0.4 MG CAPS capsule Take 1 capsule (0.4 mg total) by mouth daily.   timolol (TIMOPTIC) 0.5 % ophthalmic solution Place 1 drop into both eyes 2 (two) times daily.    Tiotropium Bromide Monohydrate (SPIRIVA RESPIMAT) 2.5 MCG/ACT AERS Inhale 2.5 mcg into the lungs daily.   traMADol (ULTRAM) 50 MG tablet Take 1 tablet (50 mg total) by mouth every 6 (six) hours as needed.   Travoprost, BAK Free, (TRAVATAN Z) 0.004 % SOLN ophthalmic solution Place 1 drop into both eyes at bedtime.   triamcinolone (KENALOG) 0.025 % cream Apply 1 application topically 2 (two) times daily as needed (rash).    vitamin B-12 (CYANOCOBALAMIN)  500 MCG tablet Take 500 mcg by mouth daily.   [DISCONTINUED] Insulin Detemir (LEVEMIR) 100 UNIT/ML Pen Inject 5 Units into the skin 2 (two) times daily. (Patient not taking: Reported on 01/19/2020)   [DISCONTINUED] ipratropium-albuterol (DUONEB) 0.5-2.5 (3) MG/3ML SOLN Take 3 mLs by nebulization every 6 (six) hours as needed. (Patient not taking: Reported on 01/19/2020)   No facility-administered encounter medications on file as of 01/13/2022.   Patient Active Problem List   Diagnosis Date Noted   Essential and other specified forms of tremor 07/24/2021   Persons encountering health services in other specified circumstances 07/24/2021   Psychosexual dysfunction with inhibited sexual excitement 07/24/2021   Pulmonary fibrosis, unspecified ([place]) 07/24/2021   Unspecified abnormalities of gait and mobility 07/24/2021   Preop exam for internal medicine 03/07/2021   Other abnormalities of gait and mobility 02/04/2021   Cataract 02/04/2021   Wears partial dentures    Wears glasses  Wears hearing aid    Wears dentures    Weak urinary stream    Vertigo, intermittent    RLS (restless legs syndrome)    Productive cough    Nocturia    History of peptic ulcer    History of kidney stones    History of hiatal hernia    History of exercise stress test    History of colon polyps    History of bladder cancer    Fatty liver disease, nonalcoholic    Glaucoma    DDD (degenerative disc disease), lumbosacral    Chronic low back pain    BPH (benign prostatic hyperplasia)    Acute bronchitis    Lumbar post-laminectomy syndrome 06/20/2020   Aortic atherosclerosis ([place]) 04/25/2020   AKI (acute kidney injury) ([place]) 01/23/2020   Closed wedge fracture of lumbar vertebra ([place]) 12/16/2019   Osteoporosis 12/16/2019   Chest pain of uncertain etiology 89/37/3428   Essential hypertension 11/12/2019   Dizziness 11/12/2019   Obesity (BMI 30-39.9) 11/12/2019   History of COVID-19 10/03/2019   Acute respiratory  failure with hypoxia ([place]) 10/03/2019   Pneumonia due to COVID-19 virus 08/16/2019   Acute respiratory failure due to COVID-19 ([place]) 08/16/2019   COVID-19 virus infection 08/09/2019   Memory loss 06/24/2019   Wheezing 06/24/2019   Abrasion of left arm 04/11/2019   Joint swelling of lower leg 11/29/2018   Abnormal urinalysis 02/25/2018   Anxiety 01/08/2018   Laceration of hand 12/18/2017   Pain of left hand 12/18/2017   Laceration of scalp without foreign body 06/09/2017   Nonintractable headache 06/09/2017   Acute sinus infection 04/14/2017   Nausea and vomiting 03/27/2017   Hyponatremia 03/17/2017   Right lumbar radiculopathy 01/21/2017   Fall 01/21/2017   Synovial cyst 01/21/2017   Chronic pain 09/11/2016   Generalized weakness 05/22/2016   UTI (urinary tract infection) 05/22/2016   Weakness 05/22/2016   Fever blister 10/18/2015   Dyspnea 10/17/2015   Bradycardia 09/17/2015   Chest pain at rest 09/17/2015   CAD in native artery 09/17/2015   CHF (congestive heart failure) ([place]) 09/17/2015   Bladder cancer ([place]) 09/17/2015   Essential tremor 09/17/2015   Chest pain 09/17/2015   Anginal chest pain at rest [facility]) 09/17/2015   Hypokalemia 07/13/2015   Vertigo 07/01/2015   Dizzinesses 07/01/2015   Spinal stenosis of lumbar region at multiple levels 07/06/2014   Angular cheilitis 05/05/2014   Left lumbar radiculopathy 05/04/2014   Right elbow pain 12/29/2013   Lateral epicondylitis of right elbow 12/29/2013   Cough 09/14/2013   Spinal stenosis, lumbar 07/27/2013   Palpable mass of neck 07/21/2013   Vasovagal episode 01/10/2013   Left otitis media 10/28/2012   Impaired glucose tolerance 10/28/2012   Cellulitis 04/24/2012   Hemorrhoids 04/24/2012   Syncope 04/21/2012   Depression 09/12/2010   RENAL ARTERY STENOSIS 09/12/2010   FATIGUE 09/12/2010   BUNION, RIGHT FOOT 02/05/2010   RECTAL BLEEDING 07/10/2009   Diarrhea 07/10/2009   DIVERTICULOSIS OF COLON 03/28/2009    FATTY LIVER DISEASE 03/28/2009   BRADYCARDIA 03/20/2009   DIZZINESS 10/17/2008   CHEST PAIN 10/17/2008   Malignant neoplasm of bladder ([place]) 02/07/2008   Allergic rhinitis 02/07/2008   COLONIC POLYPS, HX OF 02/07/2008   Hyperlipidemia 11/24/2007   GERD 11/24/2007   HIATAL HERNIA WITH REFLUX 11/24/2007   CKD (chronic kidney disease) stage 3, GFR 30-59 ml/min ([place]) 11/24/2007   Rheumatoid arthritis ([place]) 11/24/2007   [place] DISEASE, LUMBAR 11/24/2007   ADENOIDECTOMY, HX OF  11/24/2007   Conditions to be addressed/monitored:  CHF, HTN, and chronic pain  Care Plan : RN Care Manager Plan of Care  Updates made by Knox Royalty, RN since 01/13/2022 12:00 AM     Problem: Chronic Disease Management Needs   Priority: Medium     Long-Range Goal: Development of plan of care for long term chronic disease management   Start Date: 03/22/2021  Expected End Date: 03/22/2022  Priority: Medium  Note:   Current Barriers:  Chronic Disease Management support and education needs related to HTN and chronic pain  RNCM Clinical Goal(s):  Patient will demonstrate ongoing health management independence HTN; chronic pain  through collaboration with RN Care manager, provider, and care team.   Interventions: 1:1 collaboration with primary care provider regarding development and update of comprehensive plan of care as evidenced by provider attestation and co-signature Inter-disciplinary care team collaboration (see longitudinal plan of care) Evaluation of current treatment plan related to  self management and patient's adherence to plan as established by provider Review of patient status, including review of consultants reports, relevant laboratory and other test results, and medications completed SDOH updated: no new/ unmet concerns identified Pain assessment updated: continues to report "usual" chronic back pain; declines to quantify pain, states, "it's not that bad" using prescribed medications/ nerve  stimulator; reports ongoing good management of pain Falls assessment updated: continues to deny new/ recent falls x 12 months- continues using walker and electric scooter;  positive reinforcement provided with encouragement to continue efforts at fall prevention; previously provided education around fall risks/ prevention reinforced Medications discussed: reports daughter-in-law continues to manage all aspects of medication administration and denies current concerns/ issues/ questions around medications; endorses adherence to taking all medications as prescribed Reviewed upcoming scheduled provider appointments: 02/10/22- podiatry; 06/09/22- PCP; patient confirms is aware of all and has plans to attend as scheduled Discussed plans with patient for ongoing care management follow up- patient denies current care coordination/ care management needs and is agreeable to CCM RN CM case closure today; verbalizes understanding to contact PCP or other care providers for any needs that arise in the future, and confirms he has contact information for all care providers     Hypertension: (Status: Goal Met.) Last practice recorded BP readings:  BP Readings from Last 3 Encounters:  12/11/21 120/76  05/01/21 100/65  03/28/21 122/65  Most recent eGFR/CrCl: No results found for: EGFR  No components found for: CRCL  Evaluation of current treatment plan related to hypertension self management and patient's adherence to plan as established by provider;   Reviewed prescribed diet heart healthy, low salt Confirmed patient continues to monitor/ write down on paper blood pressures at home- patient unable to review specific values today as he is not near his recorded logs from daughter-in-law monitoring of BP's at home: again reports "all of them are normal" Confirmed no medication concerns: caregivers continue to prepare medications in pill box, patient then takes independently Reiterated previously provided education  around purpose of daily weights in setting of concurrent CHF; confirmed no clinical concerns with breathing status or peripheral swelling  Pain:  (Status: Goal Met.) Pain assessment performed- patient reports pain remains well managed; states orthopedic provider has informed him there are no additional surgical procedures to manage pain; he states he has "learned to live" with his pain and feels his current interventions are "working as good as they can"  Confirmed no recent changes to medications- continues taking prescribed narcotics and has implanted nerve stimulator  Reviewed provider established plan for pain management; Discussed importance of adherence to all scheduled medical appointments; Counseled on the importance of reporting any/all new or changed pain symptoms or management strategies to pain management provider;     Plan: No further follow up required: patient denies current care coordination/ care management needs and is agreeable to CCM RN CM case closure today; CCM RN CM case closure accordingly     Oneta Rack, RN, BSN, [place] [phone]: direct office

## 2022-01-13 NOTE — Telephone Encounter (Signed)
*  STAT* If patient is at the pharmacy, call can be transferred to refill team.   1. Which medications need to be refilled? (please list name of each medication and dose if known)  furosemide (LASIX) 20 MG tablet  2. Which pharmacy/location (including street and city if local pharmacy) is medication to be sent to? [id number] [address] [place]      3. Do they need a 30 day or 90 day supply? 30 with refills

## 2022-01-15 MED ORDER — FUROSEMIDE 20 MG PO TABS: ORAL_TABLET | ORAL | 1 refills | Status: DC

## 2022-01-15 NOTE — Telephone Encounter (Signed)
Patient's son is calling back to check on the status of this prescription due to the pt only having a few tablets left.

## 2022-01-17 ENCOUNTER — Ambulatory Visit: Payer: Medicare Other | Admitting: Podiatry

## 2022-02-10 ENCOUNTER — Encounter: Payer: Self-pay | Admitting: Podiatry

## 2022-02-10 ENCOUNTER — Ambulatory Visit (INDEPENDENT_AMBULATORY_CARE_PROVIDER_SITE_OTHER): Payer: Medicare Other | Admitting: Podiatry

## 2022-02-10 DIAGNOSIS — M79676 Pain in unspecified toe(s): Secondary | ICD-10-CM

## 2022-02-10 DIAGNOSIS — I509 Heart failure, unspecified: Secondary | ICD-10-CM

## 2022-02-10 DIAGNOSIS — I10 Essential (primary) hypertension: Secondary | ICD-10-CM | POA: Diagnosis not present

## 2022-02-10 DIAGNOSIS — B351 Tinea unguium: Secondary | ICD-10-CM | POA: Diagnosis not present

## 2022-02-15 NOTE — Progress Notes (Signed)
  Subjective:  Patient ID: [name], male    DOB: 1936/01/04,  MRN: [id number]  [name] presents to clinic today for painful elongated mycotic toenails 1-5 bilaterally which are tender when wearing enclosed shoe gear. Pain is relieved with periodic professional debridement.  New problem(s): None.   PCP is Biagio Borg, MD , and last visit was  Dec 11, 2021  Allergies  Allergen Reactions   Morphine And Related Other (See Comments)    Migraine    Sulfa Antibiotics Rash and Itching    Patient  Is ok to use paper tape   Hydrochlorothiazide Other (See Comments)    REACTION: hyponatremia with daily use   Tape Itching    Patient  Is ok to use paper tape   Hytrin [Terazosin] Other (See Comments)    Dizziness   Prednisone Other (See Comments)    Caused problem with blood sugar   Latex Rash   Lisinopril Cough   Sulfur Dioxide Itching and Rash    Review of Systems: Negative except as noted in the HPI.  Objective: No changes noted in today's physical examination. General: [name] is a pleasant 86 y.o. Caucasian male, WD, WN in NAD. AAO x 3.   Vascular:  Capillary refill time to digits immediate b/l. Palpable pedal pulses b/l LE. Pedal hair present. Lower extremity skin temperature gradient within normal limits. No edema noted b/l LE. No ischemia or gangrene noted b/l LE. No cyanosis or clubbing noted b/l LE.   Dermatological:  Pedal integument with normal turgor, texture and tone b/l LE. No open wounds b/l. No interdigital macerations b/l. Toenails 2-5 b/l elongated, thickened, discolored with subungual debris. +Tenderness with dorsal palpation of nailplates. Anonychia bilateral great toes. Nailbeds epithelialized. No hyperkeratotic nor porokeratotic lesions.   Musculoskeletal:  Muscle strength 5/5 to all lower extremity muscle groups bilaterally. No pain, crepitus or joint limitation noted with ROM bilateral LE. Utilizes rollator for ambulation assistance.   Neurological:   Protective sensation intact 5/5 intact bilaterally with 10g monofilament b/l. Vibratory sensation intact b/l.      Latest Ref Rng & Units 12/11/2021    3:19 PM  Hemoglobin A1C  Hemoglobin-A1c 4.6 - 6.5 % 5.6    Assessment/Plan: 1. Pain due to onychomycosis of toenail      -Examined patient. -Patient to continue soft, supportive shoe gear daily. -Mycotic toenails 1-5 bilaterally were debrided in length and girth with sterile nail nippers and dremel without incident. -Patient/POA to call should there be question/concern in the interim.   Return in about 3 months (around 05/13/2022).  Marzetta Board, DPM

## 2022-02-27 ENCOUNTER — Other Ambulatory Visit: Payer: Self-pay | Admitting: Internal Medicine

## 2022-02-27 NOTE — Telephone Encounter (Signed)
Please refill as per office routine med refill policy (all routine meds to be refilled for 3 mo or monthly (per pt preference) up to one year from last visit, then month to month grace period for 3 mo, then further med refills will have to be denied) ? ?

## 2022-02-28 ENCOUNTER — Telehealth: Payer: Self-pay | Admitting: Internal Medicine

## 2022-02-28 ENCOUNTER — Other Ambulatory Visit: Payer: Self-pay | Admitting: Internal Medicine

## 2022-02-28 MED ORDER — POTASSIUM CHLORIDE ER 10 MEQ PO TBCR: 10.0000 meq | EXTENDED_RELEASE_TABLET | Freq: Every day | ORAL | 3 refills | Status: DC

## 2022-02-28 MED ORDER — CLONAZEPAM 0.5 MG PO TABS: ORAL_TABLET | ORAL | 2 refills | Status: DC

## 2022-02-28 NOTE — Telephone Encounter (Signed)
[facility]   Patient also needs [place] - please send to [facility]

## 2022-02-28 NOTE — Telephone Encounter (Signed)
Ok both done erx

## 2022-03-03 ENCOUNTER — Other Ambulatory Visit: Payer: Self-pay | Admitting: Physical Medicine and Rehabilitation

## 2022-03-03 DIAGNOSIS — M961 Postlaminectomy syndrome, not elsewhere classified: Secondary | ICD-10-CM

## 2022-03-27 ENCOUNTER — Ambulatory Visit
Admission: RE | Admit: 2022-03-27 | Discharge: 2022-03-27 | Disposition: A | Discharge status: Home / Self Care | Payer: Medicare Other | Source: Ambulatory Visit | Attending: Physical Medicine and Rehabilitation | Admitting: Physical Medicine and Rehabilitation

## 2022-03-27 DIAGNOSIS — M961 Postlaminectomy syndrome, not elsewhere classified: Secondary | ICD-10-CM

## 2022-04-10 ENCOUNTER — Ambulatory Visit (INDEPENDENT_AMBULATORY_CARE_PROVIDER_SITE_OTHER): Payer: Medicare Other

## 2022-04-10 VITALS — Ht 66.0 in | Wt 204.0 lb

## 2022-04-10 DIAGNOSIS — Z0000 Encounter for general adult medical examination without abnormal findings: Secondary | ICD-10-CM | POA: Diagnosis not present

## 2022-04-10 NOTE — Progress Notes (Signed)
Virtual Visit via Telephone Note  I connected with  [name] on 04/10/22 at  1:00 PM EDT by telephone and verified that I am speaking with the correct person using two identifiers.  Location: Patient: Home Provider: [place] Persons participating in the virtual visit: [place]   I discussed the limitations, risks, security and privacy concerns of performing an evaluation and management service by telephone and the availability of in person appointments. The patient expressed understanding and agreed to proceed.  Interactive audio and video telecommunications were attempted between this nurse and patient, however failed, due to patient having technical difficulties OR patient did not have access to video capability.  We continued and completed visit with audio only.  Some vital signs may be absent or patient reported.   Sheral Flow, LPN  Subjective:   [name] is a 86 y.o. male who presents for Medicare Annual/Subsequent preventive examination.  Review of Systems     Cardiac Risk Factors include: advanced age (>20mn, >>25women);dyslipidemia;hypertension;family history of premature cardiovascular disease;male gender;obesity (BMI >30kg/m2)     Objective:    Today's Vitals   04/10/22 1320  Weight: 204 lb (92.5 kg)  Height: '5\' 6"'$  (1.676 m)  PainSc: 0-No pain   Body mass index is 32.93 kg/m.     04/10/2022    1:05 PM 04/09/2021    5:51 PM 03/28/2021    9:06 AM 03/21/2021   10:14 AM 01/02/2020   12:33 PM 10/24/2019    9:53 AM 08/17/2019    3:03 AM  Advanced Directives  Does Patient Have a Medical Advance Directive? Yes Yes No No Yes Yes   Type of [place] will       Does patient want to make changes to medical advance directive?  No - Patient declined   No - Patient declined    Copy of [place] Chart? No - copy requested No - copy  requested       Would patient like information on creating a medical advance directive?  No - Patient declined No - Patient declined No - Patient declined   No - Patient declined    Current Medications (verified) Outpatient Encounter Medications as of 04/10/2022  Medication Sig   acetaminophen (TYLENOL) 650 MG CR tablet Take 1,300 mg by mouth every 8 (eight) hours as needed for pain.   albuterol (PROVENTIL) (2.5 MG/3ML) 0.083% nebulizer solution Take 3 mLs (2.5 mg total) by nebulization every 6 (six) hours as needed for wheezing or shortness of breath.   albuterol (VENTOLIN HFA) 108 (90 Base) MCG/ACT inhaler Inhale 2 puffs into the lungs every 6 (six) hours as needed for wheezing or shortness of breath.   amLODipine (NORVASC) 10 MG tablet TAKE 1 TABLET EVERY DAY   amoxicillin-clavulanate (AUGMENTIN) 500-125 MG tablet Take 1 tablet by mouth 2 (two) times daily.   benzonatate (TESSALON) 100 MG capsule TAKE 1 CAPSULE(100 MG) BY MOUTH THREE TIMES DAILY AS NEEDED FOR COUGH   brimonidine (ALPHAGAN) 0.15 % ophthalmic solution Place 1 drop into both eyes 2 (two) times daily.   cetirizine (ZYRTEC) 10 MG tablet Take 10 mg by mouth daily.   ciprofloxacin (CIPRO) 500 MG tablet    citalopram (CELEXA) 20 MG tablet TAKE 1 TABLET EVERY DAY   clonazePAM (KLONOPIN) 0.5 MG tablet TAKE 1 TABLET(0.5 MG) BY MOUTH TWICE DAILY AS NEEDED FOR ANXIETY   clotrimazole (LOTRIMIN) 1 % cream  Apply 1 application topically 2 (two) times daily as needed (FOR RASH). Prescribed for rash in groin   dextromethorphan-guaiFENesin (MUCINEX DM) 30-600 MG 12hr tablet Take 1 tablet by mouth 2 (two) times daily.   fluticasone (FLONASE) 50 MCG/ACT nasal spray USE 2 SPRAYS IN EACH NOSTRIL DAILY   furosemide (LASIX) 20 MG tablet TAKE 1 TABLET BY MOUTH TWICE A WEEK; ON TUESDAY AND SATURDAY   gabapentin (NEURONTIN) 300 MG capsule Take 3-4 capsules (900-1,200 mg total) by mouth at bedtime.   hydrALAZINE (APRESOLINE) 10 MG tablet TAKE 1 TABLET  THREE TIMES DAILY AS NEEDED FOR BLOOD PRESSURE GREATER THAN 150/90   hydroxypropyl methylcellulose / hypromellose (ISOPTO TEARS / GONIOVISC) 2.5 % ophthalmic solution Place 1 drop into both eyes 2 (two) times daily.   insulin aspart (NOVOLOG FLEXPEN) 100 UNIT/ML FlexPen Novolog FlexPen U-100 Insulin aspart 100 unit/mL (3 mL) subcutaneous  INJECT 4 UNITS UNDER THE SKIN THREE TIMES DAILY WITH MEALS   Insulin Pen Needle 29G X 10MM MISC Use as directed   lidocaine (LIDODERM) 5 %    LORazepam (ATIVAN) 1 MG tablet Take 1 mg by mouth daily as needed for anxiety.   losartan (COZAAR) 100 MG tablet TAKE 1 TABLET EVERY DAY   meclizine (ANTIVERT) 25 MG tablet TAKE 1 TABLET TWICE DAILY AS NEEDED FOR DIZZINESS (Patient taking differently: Take 25 mg by mouth 2 (two) times daily.)   methocarbamol (ROBAXIN) 500 MG tablet Take 1 tablet by mouth 3 (three) times daily as needed.   nitroGLYCERIN (NITROSTAT) 0.4 MG SL tablet PLACE 1 TABLET UNDER THE TONGUE EVERY 5 MINUTES AS NEEDED FOR CHEST PAIN. MAX 3 DOSES. CALL 911 IF NO IMPROVEMENT AFTER 1ST DOSE   omeprazole (PRILOSEC) 40 MG capsule TAKE 1 CAPSULE TWICE DAILY   ondansetron (ZOFRAN) 4 MG tablet    potassium chloride (KLOR-CON 10) 10 MEQ tablet Take 1 tablet (10 mEq total) by mouth daily.   potassium chloride (KLOR-CON M) 10 MEQ tablet    primidone (MYSOLINE) 250 MG tablet TAKE 1 TABLET (250 MG TOTAL) BY MOUTH 4 (FOUR) TIMES DAILY. (Patient taking differently: Take 125 mg by mouth 3 (three) times daily.)   simvastatin (ZOCOR) 40 MG tablet TAKE 1 AND 1/2 TABLETS AT BEDTIME   tamsulosin (FLOMAX) 0.4 MG CAPS capsule Take 1 capsule (0.4 mg total) by mouth daily.   timolol (TIMOPTIC) 0.5 % ophthalmic solution Place 1 drop into both eyes 2 (two) times daily.    Tiotropium Bromide Monohydrate (SPIRIVA RESPIMAT) 2.5 MCG/ACT AERS Inhale 2.5 mcg into the lungs daily.   traMADol (ULTRAM) 50 MG tablet Take 1 tablet (50 mg total) by mouth every 6 (six) hours as needed.    Travoprost, BAK Free, (TRAVATAN Z) 0.004 % SOLN ophthalmic solution Place 1 drop into both eyes at bedtime.   triamcinolone (KENALOG) 0.025 % cream Apply 1 application topically 2 (two) times daily as needed (rash).    vitamin B-12 (CYANOCOBALAMIN) 500 MCG tablet Take 500 mcg by mouth daily.   zinc sulfate 220 (50 Zn) MG capsule Take 1 capsule by mouth daily.   [DISCONTINUED] Insulin Detemir (LEVEMIR) 100 UNIT/ML Pen Inject 5 Units into the skin 2 (two) times daily. (Patient not taking: Reported on 01/19/2020)   [DISCONTINUED] ipratropium-albuterol (DUONEB) 0.5-2.5 (3) MG/3ML SOLN Take 3 mLs by nebulization every 6 (six) hours as needed. (Patient not taking: Reported on 01/19/2020)   No facility-administered encounter medications on file as of 04/10/2022.    Allergies (verified) Morphine and related, Sulfa antibiotics, Hydrochlorothiazide, Tape,  Hytrin [terazosin], Prednisone, Latex, Lisinopril, and Sulfur dioxide   History: Past Medical History:  Diagnosis Date   Acute bronchitis per pt no fever--  cough since discharge from hospital 07-02-2015   per pcp note 07-05-2015  mild to moderate bronchitis vs pna   Allergic rhinitis    Bladder cancer (HCC)    BPH (benign prostatic hyperplasia)    Bradycardia    CAD (coronary artery disease)    CHF (congestive heart failure) (HCC)    Chronic low back pain    COPD (chronic obstructive pulmonary disease) (HCC)    DDD (degenerative disc disease), lumbosacral    Depression    Diverticulosis of colon (without mention of hemorrhage)    Dyspnea    Essential tremor    Fatty liver disease, nonalcoholic    GERD (gastroesophageal reflux disease)    Glaucoma    History of bladder cancer    2004--  TCC low-grade , non-invasive   History of colon polyps    History of exercise stress test    02-05-2007-- ETT (Clinically positive, electrically equivocal, submaximal ETT,  appropriate bp response to exercise   History of hiatal hernia    History of kidney  stones    History of peptic ulcer    HLD (hyperlipidemia)    HTN (hypertension)    Nocturia    Pneumonia    Productive cough    Rheumatoid arthritis(714.0)    Right renal artery stenosis ([place])    per cath 03-14-2009 --- 40-50%   RLS (restless legs syndrome)    Stroke (HCC)    TIA   Vertigo, intermittent    Weak urinary stream    Wears dentures    upper   Wears glasses    Wears hearing aid    bilateral   Wears partial dentures    lower   Past Surgical History:  Procedure Laterality Date   CARDIAC CATHETERIZATION  02-12-2007  dr gregg taylor   Non-obstructive CAD,  20% pLCX,  20% LAD, ef 65%   CARDIAC CATHETERIZATION  03-14-2009  dr Johnsie Cancel   pLAD and mid diaginal 20-30% multiple lesions,  OM1 20%, right renal ostial 40-50%,  ef 60%   CATARACT EXTRACTION W/ INTRAOCULAR LENS  IMPLANT, BILATERAL  07/14/2014   CYSTOSCOPY WITH BIOPSY N/A 07/11/2015   Procedure: CYSTOSCOPY WITH COLD CUP RESECTION AND FULGERATION;  Surgeon: Rana Snare, MD;  Location[facility]  Service: Urology;  Laterality: N/A;   HIP ARTHROSCOPY W/ LABRAL DEBRIDEMENT Left 06/29/2000   and chondraplasty   JOINT REPLACEMENT     right big toe replacement   KYPHOPLASTY N/A 03/28/2021   Procedure: KYPHOPLASTY lumbar one;  Surgeon: Melina Schools, MD;  Location: [facility];  Service: Orthopedics;  Laterality: N/A;   LUMBAR [place] SURGERY  07/16/2005   LUMBAR LAMINECTOMY/DECOMPRESSION MICRODISCECTOMY  07/09/2011   Procedure: LUMBAR LAMINECTOMY/DECOMPRESSION MICRODISCECTOMY;  Surgeon: Johnn Hai;  Location: [facility];  Service: Orthopedics;  Laterality: Left;  Decompression L5 - S1 on the Left and repair of dura   LUMBAR LAMINECTOMY/DECOMPRESSION MICRODISCECTOMY N/A 07/27/2013   Procedure: DECOMPRESSION L4 - L5 WITH EXCISION OF SYNOVIAL CYST AND, LATERAL MASS FUSION 1 LEVEL;  Surgeon: Johnn Hai, MD;  Location: [facility];  Service: Orthopedics;  Laterality: N/A;   LUMBAR LAMINECTOMY/DECOMPRESSION  MICRODISCECTOMY Left 07/06/2014   Procedure:  MICRO LUMBAR DECOMPRESSION L5-S1 ON LEFT, L4-5 REDO;  Surgeon: Johnn Hai, MD;  Location: [facility];  Service: Orthopedics;  Laterality: Left;   SPINAL CORD STIMULATOR  BATTERY EXCHANGE N/A 03/28/2021   Procedure: SPINAL CORD STIMULATOR BATTERY EXCHANGE/REPLACEMENT, L1 KYPHOPLASTY;  Surgeon: Melina Schools, MD;  Location: [facility];  Service: Orthopedics;  Laterality: N/A;  90 MINS LOCAL WITH IV REG   SPINAL CORD STIMULATOR INSERTION N/A 09/11/2016   Procedure: Spinal cord stimulator placement;  Surgeon: Melina Schools, MD;  Location: [facility];  Service: Orthopedics;  Laterality: N/A;   TONSILLECTOMY AND ADENOIDECTOMY     TRANSTHORACIC ECHOCARDIOGRAM  09/20/2010   normal LVF, ef 55-65%/  mild MR, TR and PR/  mild LAE   TRANSURETHRAL RESECTION OF BLADDER TUMOR  09/21/2002   Family History  Problem Relation Age of Onset   Heart attack Mother    Cancer Father        lung   Cancer Sister        lung   Diabetes Sister    Cancer Brother        lung that spread to brain   Social History   Socioeconomic History   Marital status: Widowed    Spouse name: Not on file   Number of children: 2   Years of education: Not on file   Highest education level: Not on file  Occupational History   Occupation: retired from [place]: RETIRED  Tobacco Use   Smoking status: Former    Packs/day: 2.00    Years: 20.00    Total pack years: 40.00    Types: Cigarettes    Quit date: 10/08/1982    Years since quitting: 39.5   Smokeless tobacco: Never  Vaping Use   Vaping Use: Never used  Substance and Sexual Activity   Alcohol use: Not Currently    Comment: rare wine or beer occassionally   Drug use: No   Sexual activity: Never  Other Topics Concern   Not on file  Social History Narrative   Not on file   Social Determinants of Health   Financial Resource Strain: Low Risk  (04/10/2022)   Overall Financial Resource Strain (CARDIA)    Difficulty  of Paying Living Expenses: Not hard at all  Food Insecurity: No Food Insecurity (04/10/2022)   Hunger Vital Sign    Worried About Running Out of Food in the Last Year: Never true    [place] in the Last Year: Never true  Transportation Needs: No Transportation Needs (04/10/2022)   PRAPARE - Hydrologist (Medical): No    Lack of Transportation (Non-Medical): No  Physical Activity: Insufficiently Active (04/10/2022)   Exercise Vital Sign    Days of Exercise per Week: 3 days    Minutes of Exercise per Session: 30 min  Stress: No Stress Concern Present (04/10/2022)   [place]    Feeling of Stress : Not at all  Social Connections: Moderately Integrated (04/10/2022)   Social Connection and Isolation Panel [NHANES]    Frequency of Communication with Friends and Family: More than three times a week    Frequency of Social Gatherings with Friends and Family: More than three times a week    Attends Religious Services: More than 4 times per year    Active Member of [employer] or Organizations: Yes    Attends Archivist Meetings: More than 4 times per year    Marital Status: Widowed    Tobacco Counseling Counseling given: Not Answered   Clinical Intake:  Pre-visit preparation completed: Yes  Pain : No/denies  pain Pain Score: 0-No pain     BMI - recorded: 32.93 Nutritional Status: BMI > 30  Obese Nutritional Risks: None Diabetes: No  How often do you need to have someone help you when you read instructions, pamphlets, or other written materials from your doctor or pharmacy?: 1 - Never What is the last grade level you completed in school?: 8th grade, Military Education officer, community)  Diabetic? no  Interpreter Needed?: No  Information entered by :: [employer], LPN.   Activities of Daily Living    04/10/2022    1:26 PM  In your present state of health, do you have any difficulty  performing the following activities:  Hearing? 1  Vision? 0  Difficulty concentrating or making decisions? 1  Comment depends on his son and daughter in law  Walking or climbing stairs? 1  Comment cane, electric scooter  Dressing or bathing? 0  Doing errands, shopping? 1  Preparing Food and eating ? N  Using the Toilet? N  In the past six months, have you accidently leaked urine? Y  Comment wears Depends at night  Do you have problems with loss of bowel control? Y  Comment wears Depends at night  Managing your Medications? Y  Comment need assistance  Managing your Finances? N  Housekeeping or managing your Housekeeping? Y    Patient Care Team: Biagio Borg, MD as PCP - General Berniece Salines, DO as PCP - Cardiology (Cardiology) Shon Hough, MD as Consulting Physician (Ophthalmology)  Indicate any recent Medical Services you may have received from other than [facility] providers in the past year (date may be approximate).     Assessment:   This is a routine wellness examination for [place].  Hearing/Vision screen Hearing Screening - Comments:: Patient wears hearing aids. Vision Screening - Comments:: Patient wears eyeglasses.  Eye exam done by: Marygrace Drought, MD and [facility]  Dietary issues and exercise activities discussed: Current Exercise Habits: Home exercise routine, Type of exercise: walking, Time (Minutes): 30, Frequency (Times/Week): 3, Weekly Exercise (Minutes/Week): 90, Intensity: Mild, Exercise limited by: orthopedic condition(s);respiratory conditions(s);cardiac condition(s)   Goals Addressed             This Visit's Progress    Maintain my health.        Depression Screen    04/10/2022    1:10 PM 12/11/2021    2:36 PM 07/10/2021    3:30 PM 04/09/2021    5:48 PM 10/24/2020    3:40 PM 01/23/2020    3:05 PM 01/02/2020   12:34 PM  PHQ 2/9 Scores  PHQ - 2 Score 0 1 0 0 0 0 0  PHQ- 9 Score  3         Fall Risk    04/10/2022    1:07 PM 01/13/2022    11:20 AM 07/10/2021    3:30 PM 04/09/2021    5:48 PM 03/22/2021    3:30 PM  [place] in the past year? 1 0 0 0 0  Comment  continues to deny new/ recent falls x 12 months; continues using walker and electric scooter     Number falls in past yr: 1 0 0 0 0  Injury with Fall? 1 0 0 0 0  Comment  N/A- no falls reported x last 12 months N/A- no falls reported by caregiver  N/A- no falls over last year reported  Risk for fall due to : History of fall(s);Impaired balance/gait;Orthopedic patient Impaired mobility;Orthopedic patient;Medication side effect Impaired balance/gait;Impaired  mobility;Orthopedic patient;Medication side effect;Other (Comment)  Impaired mobility;Medication side effect;History of fall(s)  Risk for fall due to: Comment   chronic pain    Follow up Education provided;Falls prevention discussed Falls prevention discussed Falls prevention discussed;Education provided  Falls prevention discussed    FALL RISK PREVENTION PERTAINING TO THE HOME:  Any stairs in or around the home? No  If so, are there any without handrails? No  Home free of loose throw rugs in walkways, pet beds, electrical cords, etc? Yes  Adequate lighting in your home to reduce risk of falls? Yes   ASSISTIVE DEVICES UTILIZED TO PREVENT FALLS:  Life alert? No  Use of a cane, walker or w/c? Yes  Grab bars in the bathroom? Yes  Shower chair or bench in shower? Yes  Elevated toilet seat or a handicapped toilet? Yes   TIMED UP AND GO:  Was the test performed? No . Phone Visit  Cognitive Function:    12/14/2017    9:47 AM  MMSE - Mini Mental State Exam  Orientation to time 5  Orientation to Place 5  Registration 3  Attention/ Calculation 3  Recall 3  Language- name 2 objects 2  Language- repeat 1  Language- follow 3 step command 3  Language- read & follow direction 1  Write a sentence 1  Copy design 1  Total score 28        04/10/2022    1:28 PM  6CIT Screen  What Year? 0 points  What  month? 0 points  What time? 0 points  Count back from 20 0 points  Months in reverse 0 points  Repeat phrase 0 points  Total Score 0 points    Immunizations Immunization History  Administered Date(s) Administered   Fluad Quad(high Dose 65+) 04/25/2020, 04/01/2022   Influenza Split 03/14/2012   Influenza Whole 05/17/2007, 04/19/2013   Influenza, High Dose Seasonal PF 04/14/2017, 04/20/2018   Influenza,inj,Quad PF,6+ Mos 04/05/2014, 06/04/2016   Influenza,inj,quad, With Preservative 04/12/2018   Influenza-Unspecified 05/14/2015   Pneumococcal Conjugate-13 05/16/2014   Pneumococcal Polysaccharide-23 05/17/2007   Tdap 11/28/2010   Unspecified SARS-COV-2 Vaccination 09/12/2019, 10/10/2019   Zoster, Live 10/25/2013    TDAP status: Due, Education has been provided regarding the importance of this vaccine. Advised may receive this vaccine at local pharmacy or Health Dept. Aware to provide a copy of the vaccination record if obtained from local pharmacy or Health Dept. Verbalized acceptance and understanding.  Flu Vaccine status: Up to date  Pneumococcal vaccine status: Up to date  Covid-19 vaccine status: Completed vaccines  Qualifies for Shingles Vaccine? Yes   Zostavax completed Yes   Shingrix Completed?: Yes  Screening Tests Health Maintenance  Topic Date Due   COVID-19 Vaccine (8 - Mixed Product risk series) 06/21/2021   TETANUS/TDAP  10/19/2021   Pneumonia Vaccine 58+ Years old  Completed   INFLUENZA VACCINE  Completed   Zoster Vaccines- Shingrix  Completed   HPV VACCINES  Aged Out    Health Maintenance  Health Maintenance Due  Topic Date Due   COVID-19 Vaccine (8 - Mixed Product risk series) 06/21/2021   TETANUS/TDAP  10/19/2021    Colorectal cancer screening: No longer required.   Lung Cancer Screening: (Low Dose CT Chest recommended if Age 75-80 years, 30 pack-year currently smoking OR have quit w/in 15years.) does not qualify.   Lung Cancer Screening  Referral: no  Additional Screening:  Hepatitis C Screening: does not qualify; Completed no  Vision Screening: Recommended annual ophthalmology exams for  early detection of glaucoma and other disorders of the eye. Is the patient up to date with their annual eye exam?  Yes  Who is the provider or what is the name of the office in which the patient attends annual eye exams? Marygrace Drought, MD and [facility] If pt is not established with a provider, would they like to be referred to a provider to establish care? No .   Dental Screening: Recommended annual dental exams for proper oral hygiene  Community Resource Referral / Chronic Care Management: CRR required this visit?  No   CCM required this visit?  No      Plan:     I have personally reviewed and noted the following in the patient's chart:   Medical and social history Use of alcohol, tobacco or illicit drugs  Current medications and supplements including opioid prescriptions. Patient is not currently taking opioid prescriptions. Functional ability and status Nutritional status Physical activity Advanced directives List of other physicians Hospitalizations, surgeries, and ER visits in previous 12 months Vitals Screenings to include cognitive, depression, and falls Referrals and appointments  In addition, I have reviewed and discussed with patient certain preventive protocols, quality metrics, and best practice recommendations. A written personalized care plan for preventive services as well as general preventive health recommendations were provided to patient.     Sheral Flow, LPN   9/37/9024   Nurse Notes: N/A

## 2022-04-10 NOTE — Patient Instructions (Signed)
[name] , Thank you for taking time to come for your Medicare Wellness Visit. I appreciate your ongoing commitment to your health goals. Please review the following plan we discussed and let me know if I can assist you in the future.   These are the goals we discussed:  Goals      Maintain my health.        This is a list of the screening recommended for you and due dates:  Health Maintenance  Topic Date Due   COVID-19 Vaccine (8 - Mixed Product risk series) 06/21/2021   Tetanus Vaccine  10/19/2021   Pneumonia Vaccine  Completed   Flu Shot  Completed   Zoster (Shingles) Vaccine  Completed   HPV Vaccine  Aged Out    Advanced directives: Yes; Please bring a copy of your health care power of attorney and living will to the office at your convenience.  Conditions/risks identified: Yes  Next appointment: Follow up in one year for your annual wellness visit.   Preventive Care 28 Years and Older, Male  Preventive care refers to lifestyle choices and visits with your health care provider that can promote health and wellness. What does preventive care include? A yearly physical exam. This is also called an annual well check. Dental exams once or twice a year. Routine eye exams. Ask your health care provider how often you should have your eyes checked. Personal lifestyle choices, including: Daily care of your teeth and gums. Regular physical activity. Eating a healthy diet. Avoiding tobacco and drug use. Limiting alcohol use. Practicing safe sex. Taking low doses of aspirin every day. Taking vitamin and mineral supplements as recommended by your health care provider. What happens during an annual well check? The services and screenings done by your health care provider during your annual well check will depend on your age, overall health, lifestyle risk factors, and family history of disease. Counseling  Your health care provider may ask you questions about your: Alcohol  use. Tobacco use. Drug use. Emotional well-being. Home and relationship well-being. Sexual activity. Eating habits. History of falls. Memory and ability to understand (cognition). Work and work Statistician. Screening  You may have the following tests or measurements: Height, weight, and BMI. Blood pressure. Lipid and cholesterol levels. These may be checked every 5 years, or more frequently if you are over 60 years old. Skin check. Lung cancer screening. You may have this screening every year starting at age 31 if you have a 30-pack-year history of smoking and currently smoke or have quit within the past 15 years. Fecal occult blood test (FOBT) of the stool. You may have this test every year starting at age 55. Flexible sigmoidoscopy or colonoscopy. You may have a sigmoidoscopy every 5 years or a colonoscopy every 10 years starting at age 46. Prostate cancer screening. Recommendations will vary depending on your family history and other risks. Hepatitis C blood test. Hepatitis B blood test. Sexually transmitted disease (STD) testing. Diabetes screening. This is done by checking your blood sugar (glucose) after you have not eaten for a while (fasting). You may have this done every 1-3 years. Abdominal aortic aneurysm (AAA) screening. You may need this if you are a current or former smoker. Osteoporosis. You may be screened starting at age 28 if you are at high risk. Talk with your health care provider about your test results, treatment options, and if necessary, the need for more tests. Vaccines  Your health care provider may recommend certain vaccines,  such as: Influenza vaccine. This is recommended every year. Tetanus, diphtheria, and acellular pertussis (Tdap, Td) vaccine. You may need a Td booster every 10 years. Zoster vaccine. You may need this after age 36. Pneumococcal 13-valent conjugate (PCV13) vaccine. One dose is recommended after age 63. Pneumococcal polysaccharide  (PPSV23) vaccine. One dose is recommended after age 77. Talk to your health care provider about which screenings and vaccines you need and how often you need them. This information is not intended to replace advice given to you by your health care provider. Make sure you discuss any questions you have with your health care provider. Document Released: 07/27/2015 Document Revised: 03/19/2016 Document Reviewed: 05/01/2015 Elsevier Interactive Patient Education  2017 [place] [employer] Prevention in the Home Falls can cause injuries. They can happen to people of all ages. There are many things you can do to make your home safe and to help prevent falls. What can I do on the outside of my home? Regularly fix the edges of walkways and driveways and fix any cracks. Remove anything that might make you trip as you walk through a door, such as a raised step or threshold. Trim any bushes or trees on the path to your home. Use bright outdoor lighting. Clear any walking paths of anything that might make someone trip, such as rocks or tools. Regularly check to see if handrails are loose or broken. Make sure that both sides of any steps have handrails. Any raised decks and porches should have guardrails on the edges. Have any leaves, snow, or ice cleared regularly. Use sand or salt on walking paths during winter. Clean up any spills in your garage right away. This includes oil or grease spills. What can I do in the bathroom? Use night lights. Install grab bars by the toilet and in the tub and shower. Do not use towel bars as grab bars. Use non-skid mats or decals in the tub or shower. If you need to sit down in the shower, use a plastic, non-slip stool. Keep the floor dry. Clean up any water that spills on the floor as soon as it happens. Remove soap buildup in the tub or shower regularly. Attach bath mats securely with double-sided non-slip rug tape. Do not have throw rugs and other things on the  floor that can make you trip. What can I do in the bedroom? Use night lights. Make sure that you have a light by your bed that is easy to reach. Do not use any sheets or blankets that are too big for your bed. They should not hang down onto the floor. Have a firm chair that has side arms. You can use this for support while you get dressed. Do not have throw rugs and other things on the floor that can make you trip. What can I do in the kitchen? Clean up any spills right away. Avoid walking on wet floors. Keep items that you use a lot in easy-to-reach places. If you need to reach something above you, use a strong step stool that has a grab bar. Keep electrical cords out of the way. Do not use floor polish or wax that makes floors slippery. If you must use wax, use non-skid floor wax. Do not have throw rugs and other things on the floor that can make you trip. What can I do with my stairs? Do not leave any items on the stairs. Make sure that there are handrails on both sides of the stairs and  use them. Fix handrails that are broken or loose. Make sure that handrails are as long as the stairways. Check any carpeting to make sure that it is firmly attached to the stairs. Fix any carpet that is loose or worn. Avoid having throw rugs at the top or bottom of the stairs. If you do have throw rugs, attach them to the floor with carpet tape. Make sure that you have a light switch at the top of the stairs and the bottom of the stairs. If you do not have them, ask someone to add them for you. What else can I do to help prevent falls? Wear shoes that: Do not have high heels. Have rubber bottoms. Are comfortable and fit you well. Are closed at the toe. Do not wear sandals. If you use a stepladder: Make sure that it is fully opened. Do not climb a closed stepladder. Make sure that both sides of the stepladder are locked into place. Ask someone to hold it for you, if possible. Clearly mark and make  sure that you can see: Any grab bars or handrails. First and last steps. Where the edge of each step is. Use tools that help you move around (mobility aids) if they are needed. These include: Canes. Walkers. Scooters. Crutches. Turn on the lights when you go into a dark area. Replace any light bulbs as soon as they burn out. Set up your furniture so you have a clear path. Avoid moving your furniture around. If any of your floors are uneven, fix them. If there are any pets around you, be aware of where they are. Review your medicines with your doctor. Some medicines can make you feel dizzy. This can increase your chance of falling. Ask your doctor what other things that you can do to help prevent falls. This information is not intended to replace advice given to you by your health care provider. Make sure you discuss any questions you have with your health care provider. Document Released: 04/26/2009 Document Revised: 12/06/2015 Document Reviewed: 08/04/2014 Elsevier Interactive Patient Education  2017 [employer].

## 2022-05-09 ENCOUNTER — Telehealth: Payer: Self-pay | Admitting: Internal Medicine

## 2022-05-09 NOTE — Telephone Encounter (Signed)
Please refill as per office routine med refill policy (all routine meds to be refilled for 3 mo or monthly (per pt preference) up to one year from last visit, then month to month grace period for 3 mo, then further med refills will have to be denied) ? ?

## 2022-05-10 ENCOUNTER — Other Ambulatory Visit: Payer: Self-pay | Admitting: Internal Medicine

## 2022-05-10 NOTE — Telephone Encounter (Signed)
Please refill as per office routine med refill policy (all routine meds to be refilled for 3 mo or monthly (per pt preference) up to one year from last visit, then month to month grace period for 3 mo, then further med refills will have to be denied) ? ?

## 2022-05-15 ENCOUNTER — Other Ambulatory Visit: Payer: Self-pay | Admitting: Cardiology

## 2022-05-15 MED ORDER — AMLODIPINE BESYLATE 10 MG PO TABS: 10.0000 mg | ORAL_TABLET | Freq: Every day | ORAL | 0 refills | Status: DC

## 2022-05-15 NOTE — Telephone Encounter (Signed)
Patient notified of refill.

## 2022-05-15 NOTE — Addendum Note (Signed)
Addended by: Max Sane on: 05/15/2022 01:30 PM   Modules accepted: Orders

## 2022-05-15 NOTE — Telephone Encounter (Signed)
Patient called to follow up on this, He has appointment on 06/09/22. Does he needs to be seen sooner?

## 2022-05-20 ENCOUNTER — Ambulatory Visit: Payer: Medicare Other | Admitting: Podiatry

## 2022-06-02 ENCOUNTER — Ambulatory Visit: Payer: Medicare Other | Attending: Cardiology | Admitting: Cardiology

## 2022-06-02 ENCOUNTER — Encounter: Payer: Self-pay | Admitting: Cardiology

## 2022-06-02 VITALS — BP 122/54 | HR 58 | Ht 66.0 in | Wt 202.2 lb

## 2022-06-02 DIAGNOSIS — I10 Essential (primary) hypertension: Secondary | ICD-10-CM | POA: Diagnosis not present

## 2022-06-02 DIAGNOSIS — I5032 Chronic diastolic (congestive) heart failure: Secondary | ICD-10-CM | POA: Insufficient documentation

## 2022-06-02 DIAGNOSIS — I2510 Atherosclerotic heart disease of native coronary artery without angina pectoris: Secondary | ICD-10-CM | POA: Diagnosis not present

## 2022-06-02 NOTE — Progress Notes (Signed)
Cardiology Office Note:    Date:  06/02/2022   ID:  [name], DOB 1936-02-10, MRN [id number]  PCP:  Biagio Borg, MD  Cardiologist:  Berniece Salines, DO  Electrophysiologist:  None   Referring MD: Biagio Borg, MD   " I am doing well"  History of Present Illness:    [name] is a 86 y.o. male with a hx of nonobstructive CAD per records cardiac catheterization in 2010 hypertension, hyperlipidemia, history of COVID-19 infection and COPD presents today for follow-up visit.   I last saw the patient on November 11, 2019 at that time giving his symptoms I recommended he undergo nuclear stress test as well as an echocardiogram.  In the interim the patient able to get his testing done, and I was able to discuss the results with him prior.   I saw the patient in August 2021 at that time he appeared to be doing well from a cardiovascular standpoint.  He was pending a result of his pulmonary function test.  No medication changes were made at that time.   I saw the patient on 08/29/2020 at that time he was doing well from a cardiovascular standpoint.    His last visit was May 01, 2021 at that time he is doing well from a cardiovascular standpoint.  Since I saw the patient he has been doing well from a cardiovascular standpoint but has had some issues with his back.  He has had some up titration of his gabapentin and has been following with neurosurgery.  No other complaints at this time.  He is doing well.  Past Medical History:  Diagnosis Date   Acute bronchitis per pt no fever--  cough since discharge from hospital 07-02-2015   per pcp note 07-05-2015  mild to moderate bronchitis vs pna   Allergic rhinitis    Bladder cancer (HCC)    BPH (benign prostatic hyperplasia)    Bradycardia    CAD (coronary artery disease)    CHF (congestive heart failure) (HCC)    Chronic low back pain    COPD (chronic obstructive pulmonary disease) (HCC)    DDD (degenerative disc disease), lumbosacral     Depression    Diverticulosis of colon (without mention of hemorrhage)    Dyspnea    Essential tremor    Fatty liver disease, nonalcoholic    GERD (gastroesophageal reflux disease)    Glaucoma    History of bladder cancer    2004--  TCC low-grade , non-invasive   History of colon polyps    History of exercise stress test    02-05-2007-- ETT (Clinically positive, electrically equivocal, submaximal ETT,  appropriate bp response to exercise   History of hiatal hernia    History of kidney stones    History of peptic ulcer    HLD (hyperlipidemia)    HTN (hypertension)    Nocturia    Pneumonia    Productive cough    Rheumatoid arthritis(714.0)    Right renal artery stenosis ([place])    per cath 03-14-2009 --- 40-50%   RLS (restless legs syndrome)    Stroke (HCC)    TIA   Vertigo, intermittent    Weak urinary stream    Wears dentures    upper   Wears glasses    Wears hearing aid    bilateral   Wears partial dentures    lower    Past Surgical History:  Procedure Laterality Date   CARDIAC CATHETERIZATION  02-12-2007  dr gregg taylor   Non-obstructive CAD,  20% pLCX,  20% LAD, ef 65%   CARDIAC CATHETERIZATION  03-14-2009  dr Johnsie Cancel   pLAD and mid diaginal 20-30% multiple lesions,  OM1 20%, right renal ostial 40-50%,  ef 60%   CATARACT EXTRACTION W/ INTRAOCULAR LENS  IMPLANT, BILATERAL  07/14/2014   CYSTOSCOPY WITH BIOPSY N/A 07/11/2015   Procedure: CYSTOSCOPY WITH COLD CUP RESECTION AND FULGERATION;  Surgeon: Rana Snare, MD;  Location: [facility];  Service: Urology;  Laterality: N/A;   HIP ARTHROSCOPY W/ LABRAL DEBRIDEMENT Left 06/29/2000   and chondraplasty   JOINT REPLACEMENT     right big toe replacement   KYPHOPLASTY N/A 03/28/2021   Procedure: KYPHOPLASTY lumbar one;  Surgeon: Melina Schools, MD;  Location: [place];  Service: Orthopedics;  Laterality: N/A;   LUMBAR [place] SURGERY  07/16/2005   LUMBAR LAMINECTOMY/DECOMPRESSION MICRODISCECTOMY  07/09/2011    Procedure: LUMBAR LAMINECTOMY/DECOMPRESSION MICRODISCECTOMY;  Surgeon: Johnn Hai;  Location: [facility];  Service: Orthopedics;  Laterality: Left;  Decompression L5 - S1 on the Left and repair of dura   LUMBAR LAMINECTOMY/DECOMPRESSION MICRODISCECTOMY N/A 07/27/2013   Procedure: DECOMPRESSION L4 - L5 WITH EXCISION OF SYNOVIAL CYST AND, LATERAL MASS FUSION 1 LEVEL;  Surgeon: Johnn Hai, MD;  Location: [facility];  Service: Orthopedics;  Laterality: N/A;   LUMBAR LAMINECTOMY/DECOMPRESSION MICRODISCECTOMY Left 07/06/2014   Procedure:  MICRO LUMBAR DECOMPRESSION L5-S1 ON LEFT, L4-5 REDO;  Surgeon: Johnn Hai, MD;  Location: [facility];  Service: Orthopedics;  Laterality: Left;   SPINAL CORD STIMULATOR BATTERY EXCHANGE N/A 03/28/2021   Procedure: SPINAL CORD STIMULATOR BATTERY EXCHANGE/REPLACEMENT, L1 KYPHOPLASTY;  Surgeon: Melina Schools, MD;  Location: [place];  Service: Orthopedics;  Laterality: N/A;  90 MINS LOCAL WITH IV REG   SPINAL CORD STIMULATOR INSERTION N/A 09/11/2016   Procedure: Spinal cord stimulator placement;  Surgeon: Melina Schools, MD;  Location: [place];  Service: Orthopedics;  Laterality: N/A;   TONSILLECTOMY AND ADENOIDECTOMY     TRANSTHORACIC ECHOCARDIOGRAM  09/20/2010   normal LVF, ef 55-65%/  mild MR, TR and PR/  mild LAE   TRANSURETHRAL RESECTION OF BLADDER TUMOR  09/21/2002    Current Medications: Current Meds  Medication Sig   acetaminophen (TYLENOL) 650 MG CR tablet Take 1,300 mg by mouth every 8 (eight) hours as needed for pain.   albuterol (PROVENTIL) (2.5 MG/3ML) 0.083% nebulizer solution Take 3 mLs (2.5 mg total) by nebulization every 6 (six) hours as needed for wheezing or shortness of breath.   albuterol (VENTOLIN HFA) 108 (90 Base) MCG/ACT inhaler Inhale 2 puffs into the lungs every 6 (six) hours as needed for wheezing or shortness of breath.   amLODipine (NORVASC) 10 MG tablet Take 1 tablet (10 mg total) by mouth daily.   amoxicillin-clavulanate (AUGMENTIN)  500-125 MG tablet Take 1 tablet by mouth 2 (two) times daily.   benzonatate (TESSALON) 100 MG capsule TAKE 1 CAPSULE(100 MG) BY MOUTH THREE TIMES DAILY AS NEEDED FOR COUGH   brimonidine (ALPHAGAN) 0.15 % ophthalmic solution Place 1 drop into both eyes 2 (two) times daily.   cetirizine (ZYRTEC) 10 MG tablet Take 10 mg by mouth daily.   ciprofloxacin (CIPRO) 500 MG tablet    citalopram (CELEXA) 20 MG tablet TAKE 1 TABLET EVERY DAY   clonazePAM (KLONOPIN) 0.5 MG tablet TAKE 1 TABLET(0.5 MG) BY MOUTH TWICE DAILY AS NEEDED FOR ANXIETY   clotrimazole (LOTRIMIN) 1 % cream Apply 1 application topically 2 (two) times daily as needed (  FOR RASH). Prescribed for rash in groin   dextromethorphan-guaiFENesin (MUCINEX DM) 30-600 MG 12hr tablet Take 1 tablet by mouth 2 (two) times daily.   fluticasone (FLONASE) 50 MCG/ACT nasal spray USE 2 SPRAYS IN EACH NOSTRIL DAILY   furosemide (LASIX) 20 MG tablet TAKE 1 TABLET BY MOUTH TWICE A WEEK ON TUESDAY AND SATURDAY   gabapentin (NEURONTIN) 300 MG capsule Take 3-4 capsules (900-1,200 mg total) by mouth at bedtime.   hydrALAZINE (APRESOLINE) 10 MG tablet TAKE 1 TABLET THREE TIMES DAILY AS NEEDED FOR BLOOD PRESSURE GREATER THAN 150/90   hydroxypropyl methylcellulose / hypromellose (ISOPTO TEARS / GONIOVISC) 2.5 % ophthalmic solution Place 1 drop into both eyes 2 (two) times daily.   insulin aspart (NOVOLOG FLEXPEN) 100 UNIT/ML FlexPen Novolog FlexPen U-100 Insulin aspart 100 unit/mL (3 mL) subcutaneous  INJECT 4 UNITS UNDER THE SKIN THREE TIMES DAILY WITH MEALS   Insulin Pen Needle 29G X 10MM MISC Use as directed   lidocaine (LIDODERM) 5 %    LORazepam (ATIVAN) 1 MG tablet Take 1 mg by mouth daily as needed for anxiety.   losartan (COZAAR) 100 MG tablet TAKE 1 TABLET EVERY DAY   meclizine (ANTIVERT) 25 MG tablet TAKE 1 TABLET TWICE DAILY AS NEEDED FOR DIZZINESS (Patient taking differently: Take 25 mg by mouth 2 (two) times daily.)   methocarbamol (ROBAXIN) 500 MG  tablet Take 1 tablet by mouth 3 (three) times daily as needed.   nitroGLYCERIN (NITROSTAT) 0.4 MG SL tablet PLACE 1 TABLET UNDER THE TONGUE EVERY 5 MINUTES AS NEEDED FOR CHEST PAIN. MAX 3 DOSES. CALL 911 IF NO IMPROVEMENT AFTER 1ST DOSE   omeprazole (PRILOSEC) 40 MG capsule TAKE 1 CAPSULE TWICE DAILY   ondansetron (ZOFRAN) 4 MG tablet    potassium chloride (KLOR-CON 10) 10 MEQ tablet Take 1 tablet (10 mEq total) by mouth daily.   potassium chloride (KLOR-CON M) 10 MEQ tablet    primidone (MYSOLINE) 250 MG tablet TAKE 1 TABLET (250 MG TOTAL) BY MOUTH 4 (FOUR) TIMES DAILY. (Patient taking differently: Take 125 mg by mouth 3 (three) times daily.)   simvastatin (ZOCOR) 40 MG tablet TAKE 1 AND 1/2 TABLETS AT BEDTIME   tamsulosin (FLOMAX) 0.4 MG CAPS capsule Take 1 capsule (0.4 mg total) by mouth daily.   timolol (TIMOPTIC) 0.5 % ophthalmic solution Place 1 drop into both eyes 2 (two) times daily.    Tiotropium Bromide Monohydrate (SPIRIVA RESPIMAT) 2.5 MCG/ACT AERS Inhale 2.5 mcg into the lungs daily.   traMADol (ULTRAM) 50 MG tablet Take 1 tablet (50 mg total) by mouth every 6 (six) hours as needed.   Travoprost, BAK Free, (TRAVATAN Z) 0.004 % SOLN ophthalmic solution Place 1 drop into both eyes at bedtime.   triamcinolone (KENALOG) 0.025 % cream Apply 1 application topically 2 (two) times daily as needed (rash).    vitamin B-12 (CYANOCOBALAMIN) 500 MCG tablet Take 500 mcg by mouth daily.   zinc sulfate 220 (50 Zn) MG capsule Take 1 capsule by mouth daily.     Allergies:   Morphine and related, Sulfa antibiotics, Hydrochlorothiazide, Tape, Hytrin [terazosin], Prednisone, Latex, Lisinopril, and Sulfur dioxide   Social History   Socioeconomic History   Marital status: Widowed    Spouse name: Not on file   Number of children: 2   Years of education: Not on file   Highest education level: Not on file  Occupational History   Occupation: retired from [employer]: RETIRED  Tobacco Use  Smoking status: Former    Packs/day: 2.00    Years: 20.00    Total pack years: 40.00    Types: Cigarettes    Quit date: 10/08/1982    Years since quitting: 39.6   Smokeless tobacco: Never  Vaping Use   Vaping Use: Never used  Substance and Sexual Activity   Alcohol use: Not Currently    Comment: rare wine or beer occassionally   Drug use: No   Sexual activity: Never  Other Topics Concern   Not on file  Social History Narrative   Not on file   Social Determinants of Health   Financial Resource Strain: Low Risk  (04/10/2022)   Overall Financial Resource Strain (CARDIA)    Difficulty of Paying Living Expenses: Not hard at all  Food Insecurity: No Food Insecurity (04/10/2022)   Hunger Vital Sign    Worried About Running Out of Food in the Last Year: Never true    Ran Out of Food in the Last Year: Never true  Transportation Needs: No Transportation Needs (04/10/2022)   PRAPARE - Hydrologist (Medical): No    Lack of Transportation (Non-Medical): No  Physical Activity: Insufficiently Active (04/10/2022)   Exercise Vital Sign    Days of Exercise per Week: 3 days    Minutes of Exercise per Session: 30 min  Stress: No Stress Concern Present (04/10/2022)   [place]    Feeling of Stress : Not at all  Social Connections: Moderately Integrated (04/10/2022)   Social Connection and Isolation Panel [NHANES]    Frequency of Communication with Friends and Family: More than three times a week    Frequency of Social Gatherings with Friends and Family: More than three times a week    Attends Religious Services: More than 4 times per year    Active Member of [employer] or Organizations: Yes    Attends Archivist Meetings: More than 4 times per year    Marital Status: Widowed     Family History: The patient's family history includes Cancer in his brother, father, and sister; Diabetes in his  sister; Heart attack in his mother.  ROS:   Review of Systems  Constitution: Negative for decreased appetite, fever and weight gain.  HENT: Negative for congestion, ear discharge, hoarse voice and sore throat.   Eyes: Negative for discharge, redness, vision loss in right eye and visual halos.  Cardiovascular: Negative for chest pain, dyspnea on exertion, leg swelling, orthopnea and palpitations.  Respiratory: Negative for cough, hemoptysis, shortness of breath and snoring.   Endocrine: Negative for heat intolerance and polyphagia.  Hematologic/Lymphatic: Negative for bleeding problem. Does not bruise/bleed easily.  Skin: Negative for flushing, nail changes, rash and suspicious lesions.  Musculoskeletal: Negative for arthritis, joint pain, muscle cramps, myalgias, neck pain and stiffness.  Gastrointestinal: Negative for abdominal pain, bowel incontinence, diarrhea and excessive appetite.  Genitourinary: Negative for decreased libido, genital sores and incomplete emptying.  Neurological: Negative for brief paralysis, focal weakness, headaches and loss of balance.  Psychiatric/Behavioral: Negative for altered mental status, depression and suicidal ideas.  Allergic/Immunologic: Negative for HIV exposure and persistent infections.    EKGs/Labs/Other Studies Reviewed:    The following studies were reviewed today:   EKG: Sinus bradycardia, heart rate 58 bpm compared to prior EKG no significant change heart rate slightly slower  Echo  IMPRESSIONS 11/17/19   1. Left ventricular ejection fraction, by estimation, is 60 to 65%.  The left ventricle has normal function. The left ventricle has no regional wall motion abnormalities. Left ventricular diastolic parameters are consistent with Grade I diastolic dysfunction (impaired relaxation).   2. Right ventricular systolic function is normal. The right ventricular size is normal. There is normal pulmonary artery systolic pressure.   3. Left atrial size  was mild to moderately dilated.   4. The mitral valve is normal in structure. No evidence of mitral valve regurgitation. No evidence of mitral stenosis.   5. The aortic valve is normal in structure. Aortic valve regurgitation is mild. Mild aortic valve sclerosis is present, with no evidence of aortic valve stenosis.   6. There is mild dilatation of the ascending aorta measuring 40 mm.   7. The inferior vena cava is normal in size with greater than 50% respiratory variability, suggesting right atrial pressure of 3 mmHg.    Pharmacologic nuclear stress test Nuclear stress EF: 61%. There was no ST segment deviation noted during stress. The study is normal. This is a low risk study. The left ventricular ejection fraction is normal (55-65%).   Normal stress nuclear study with no ischemia or infarction.  Gated ejection fraction 61% with normal wall motion.    Recent Labs: 12/11/2021: ALT 13; BUN 18; Creatinine, Ser 0.92; Hemoglobin 14.1; Platelets 237.0; Potassium 4.2; Sodium 137; TSH 2.58  Recent Lipid Panel    Component Value Date/Time   CHOL 157 12/11/2021 1519   TRIG 186.0 (H) 12/11/2021 1519   HDL 34.00 (L) 12/11/2021 1519   CHOLHDL 5 12/11/2021 1519   VLDL 37.2 12/11/2021 1519   LDLCALC 85 12/11/2021 1519   LDLDIRECT 86.0 06/24/2019 1204    Physical Exam:    VS:  BP (!) 122/54   Pulse (!) 58   Ht '5\' 6"'$  (1.676 m)   Wt 202 lb 3.2 oz (91.7 kg)   SpO2 97%   BMI 32.64 kg/m     Wt Readings from Last 3 Encounters:  06/02/22 202 lb 3.2 oz (91.7 kg)  04/10/22 204 lb (92.5 kg)  12/11/21 204 lb (92.5 kg)     GEN: Well nourished, well developed in no acute distress HEENT: Normal NECK: No JVD; No carotid bruits LYMPHATICS: No lymphadenopathy CARDIAC: S1S2 noted,RRR, no murmurs, rubs, gallops RESPIRATORY:  Clear to auscultation without rales, wheezing or rhonchi  ABDOMEN: Soft, non-tender, non-distended, +bowel sounds, no guarding. EXTREMITIES: No edema, No cyanosis, no  clubbing MUSCULOSKELETAL:  No deformity  SKIN: Warm and dry NEUROLOGIC:  Alert and oriented x 3, non-focal PSYCHIATRIC:  Normal affect, good insight  ASSESSMENT:    1. CAD in native artery   2. Chronic diastolic congestive heart failure ([place])   3. Essential hypertension     PLAN:     He appears to be doing well from a cardiovascular standpoint.  His blood pressure is appropriate.  No medication changes will be made today.  He has appointment with his PCP on Friday.  He has a whole set of lab  To be done so we will hold off on doing any blood work today.  I have requested that his daughter be faxed out to me.  The patient is in agreement with the above plan. The patient left the office in stable condition.  The patient will follow up in 1 year or sooner if needed.   Medication Adjustments/Labs and Tests Ordered: Current medicines are reviewed at length with the patient today.  Concerns regarding medicines are outlined above.  Orders Placed This Encounter  Procedures   EKG 12-Lead    No orders of the defined types were placed in this encounter.    Patient Instructions  Medication Instructions:  Your physician recommends that you continue on your current medications as directed. Please refer to the Current Medication list given to you today.  *If you need a refill on your cardiac medications before your next appointment, please call your pharmacy*   Lab Work: NONE If you have labs (blood work) drawn today and your tests are completely normal, you will receive your results only by: [place] (if you have MyChart) OR A paper copy in the mail If you have any lab test that is abnormal or we need to change your treatment, we will call you to review the results.   Testing/Procedures: NONE   Follow-Up: At [facility] you and your health needs are our priority.  As part of our continuing mission to provide you with exceptional heart care, we have created  designated Provider Care Teams.  These Care Teams include your primary Cardiologist (physician) and Advanced Practice Providers (APPs -  Physician Assistants and Nurse Practitioners) who all work together to provide you with the care you need, when you need it.  We recommend signing up for the patient portal called "MyChart".  Sign up information is provided on this After Visit Summary.  MyChart is used to connect with patients for Virtual Visits (Telemedicine).  Patients are able to view lab/test results, encounter notes, upcoming appointments, etc.  Non-urgent messages can be sent to your provider as well.   To learn more about what you can do with MyChart, go to [email or web address].    Your next appointment:   1 year(s)  The format for your next appointment:   In Person  Provider:   Berniece Salines, DO     Adopting a Healthy Lifestyle.  Know what a healthy weight is for you (roughly BMI <25) and aim to maintain this   Aim for 7+ servings of fruits and vegetables daily   65-80+ fluid ounces of water or unsweet tea for healthy kidneys   Limit to max 1 drink of alcohol per day; avoid smoking/tobacco   Limit animal fats in diet for cholesterol and heart health - choose grass fed whenever available   Avoid highly processed foods, and foods high in saturated/trans fats   Aim for low stress - take time to unwind and care for your mental health   Aim for 150 min of moderate intensity exercise weekly for heart health, and weights twice weekly for bone health   Aim for 7-9 hours of sleep daily   When it comes to diets, agreement about the perfect plan isnt easy to find, even among the experts. Experts at the [employer] developed an idea known as the Healthy Eating Plate. Just imagine a plate divided into logical, healthy portions.   The emphasis is on diet quality:   Load up on vegetables and fruits - one-half of your plate: Aim for color and variety, and  remember that potatoes dont count.   Go for whole grains - one-quarter of your plate: Whole wheat, barley, wheat berries, quinoa, oats, brown rice, and foods made with them. If you want pasta, go with whole wheat pasta.   Protein power - one-quarter of your plate: Fish, chicken, beans, and nuts are all healthy, versatile protein sources. Limit red meat.   The diet, however, does go beyond the plate, offering a few other  suggestions.   Use healthy plant oils, such as olive, canola, soy, corn, sunflower and peanut. Check the labels, and avoid partially hydrogenated oil, which have unhealthy trans fats.   If youre thirsty, drink water. Coffee and tea are good in moderation, but skip sugary drinks and limit milk and dairy products to one or two daily servings.   The type of carbohydrate in the diet is more important than the amount. Some sources of carbohydrates, such as vegetables, fruits, whole grains, and beans-are healthier than others.   Finally, stay active  Signed, Berniece Salines, DO  06/02/2022 8:49 AM    [facility]

## 2022-06-02 NOTE — Patient Instructions (Signed)
Medication Instructions:  Your physician recommends that you continue on your current medications as directed. Please refer to the Current Medication list given to you today.  *If you need a refill on your cardiac medications before your next appointment, please call your pharmacy*   Lab Work: NONE If you have labs (blood work) drawn today and your tests are completely normal, you will receive your results only by: MyChart Message (if you have MyChart) OR A paper copy in the mail If you have any lab test that is abnormal or we need to change your treatment, we will call you to review the results.   Testing/Procedures: NONE   Follow-Up: At [facility], you and your health needs are our priority.  As part of our continuing mission to provide you with exceptional heart care, we have created designated Provider Care Teams.  These Care Teams include your primary Cardiologist (physician) and Advanced Practice Providers (APPs -  Physician Assistants and Nurse Practitioners) who all work together to provide you with the care you need, when you need it.  We recommend signing up for the patient portal called "MyChart".  Sign up information is provided on this After Visit Summary.  MyChart is used to connect with patients for Virtual Visits (Telemedicine).  Patients are able to view lab/test results, encounter notes, upcoming appointments, etc.  Non-urgent messages can be sent to your provider as well.   To learn more about what you can do with MyChart, go to [email or web address].    Your next appointment:   1 year(s)  The format for your next appointment:   In Person  Provider:   Kardie Tobb, DO   

## 2022-06-09 ENCOUNTER — Ambulatory Visit (INDEPENDENT_AMBULATORY_CARE_PROVIDER_SITE_OTHER): Payer: Medicare Other | Admitting: Internal Medicine

## 2022-06-09 ENCOUNTER — Encounter: Payer: Self-pay | Admitting: Internal Medicine

## 2022-06-09 VITALS — BP 112/60 | HR 62 | Temp 98.3°F | Ht 66.0 in | Wt 197.0 lb

## 2022-06-09 DIAGNOSIS — M545 Low back pain, unspecified: Secondary | ICD-10-CM

## 2022-06-09 DIAGNOSIS — I10 Essential (primary) hypertension: Secondary | ICD-10-CM

## 2022-06-09 DIAGNOSIS — G8929 Other chronic pain: Secondary | ICD-10-CM

## 2022-06-09 DIAGNOSIS — J069 Acute upper respiratory infection, unspecified: Secondary | ICD-10-CM | POA: Insufficient documentation

## 2022-06-09 DIAGNOSIS — I2510 Atherosclerotic heart disease of native coronary artery without angina pectoris: Secondary | ICD-10-CM

## 2022-06-09 MED ORDER — HYDRALAZINE HCL 10 MG PO TABS: ORAL_TABLET | ORAL | 2 refills | Status: AC

## 2022-06-09 MED ORDER — TRAMADOL HCL 50 MG PO TABS: 50.0000 mg | ORAL_TABLET | Freq: Four times a day (QID) | ORAL | 2 refills | Status: DC | PRN

## 2022-06-09 MED ORDER — LOSARTAN POTASSIUM 100 MG PO TABS: 100.0000 mg | ORAL_TABLET | Freq: Every day | ORAL | 2 refills | Status: DC

## 2022-06-09 NOTE — Assessment & Plan Note (Signed)
Stable overall, no new neuro symptoms or signs  - for tramadol refill

## 2022-06-09 NOTE — Progress Notes (Signed)
Patient ID: [name], male   DOB: [birth year], 86 y.o.   MRN: [id number]        Chief Complaint: follow up URI symptoms, chronic low back pain, htn       HPI:  [name] is a 86 y.o. male  Here with 2-3 days acute onset fever, facial pain, pressure, headache, general weakness and malaise, and clearish d/c, with mild ST and cough, but pt denies chest pain, wheezing, increased sob or doe, orthopnea, PND, increased LE swelling, palpitations, dizziness or syncope.  Pt continues to have recurring LBP without change in severity, bowel or bladder change, fever, wt loss,  worsening LE pain/numbness/weakness, gait change or falls. Needs tramadol refill.  Also seen at [place].  Son ill with similar URI last wk.       Wt Readings from Last 3 Encounters:  06/09/22 197 lb (89.4 kg)  06/02/22 202 lb 3.2 oz (91.7 kg)  04/10/22 204 lb (92.5 kg)   BP Readings from Last 3 Encounters:  06/09/22 112/60  06/02/22 (!) 122/54  12/11/21 120/76         Past Medical History:  Diagnosis Date   Acute bronchitis per pt no fever--  cough since discharge from hospital 07-02-2015   per pcp note 07-05-2015  mild to moderate bronchitis vs pna   Allergic rhinitis    Bladder cancer (HCC)    BPH (benign prostatic hyperplasia)    Bradycardia    CAD (coronary artery disease)    CHF (congestive heart failure) (HCC)    Chronic low back pain    COPD (chronic obstructive pulmonary disease) (HCC)    DDD (degenerative disc disease), lumbosacral    Depression    Diverticulosis of colon (without mention of hemorrhage)    Dyspnea    Essential tremor    Fatty liver disease, nonalcoholic    GERD (gastroesophageal reflux disease)    Glaucoma    History of bladder cancer    2004--  TCC low-grade , non-invasive   History of colon polyps    History of exercise stress test    02-05-2007-- ETT (Clinically positive, electrically equivocal, submaximal ETT,  appropriate bp response to exercise   History of hiatal hernia    History  of kidney stones    History of peptic ulcer    HLD (hyperlipidemia)    HTN (hypertension)    Nocturia    Pneumonia    Productive cough    Rheumatoid arthritis(714.0)    Right renal artery stenosis ([place])    per cath 03-14-2009 --- 40-50%   RLS (restless legs syndrome)    Stroke (HCC)    TIA   Vertigo, intermittent    Weak urinary stream    Wears dentures    upper   Wears glasses    Wears hearing aid    bilateral   Wears partial dentures    lower   Past Surgical History:  Procedure Laterality Date   CARDIAC CATHETERIZATION  02-12-2007  dr gregg taylor   Non-obstructive CAD,  20% pLCX,  20% LAD, ef 65%   CARDIAC CATHETERIZATION  03-14-2009  dr Johnsie Cancel   pLAD and mid diaginal 20-30% multiple lesions,  OM1 20%, right renal ostial 40-50%,  ef 60%   CATARACT EXTRACTION W/ INTRAOCULAR LENS  IMPLANT, BILATERAL  07/14/2014   CYSTOSCOPY WITH BIOPSY N/A 07/11/2015   Procedure: CYSTOSCOPY WITH COLD CUP RESECTION AND FULGERATION;  Surgeon: Rana Snare, MD;  Location: [facility];  Service: Urology;  Laterality: N/A;   HIP ARTHROSCOPY W/ LABRAL DEBRIDEMENT Left 06/29/2000   and chondraplasty   JOINT REPLACEMENT     right big toe replacement   KYPHOPLASTY N/A 03/28/2021   Procedure: KYPHOPLASTY lumbar one;  Surgeon: Melina Schools, MD;  Location: [place];  Service: Orthopedics;  Laterality: N/A;   LUMBAR [place] SURGERY  07/16/2005   LUMBAR LAMINECTOMY/DECOMPRESSION MICRODISCECTOMY  07/09/2011   Procedure: LUMBAR LAMINECTOMY/DECOMPRESSION MICRODISCECTOMY;  Surgeon: Johnn Hai;  Location: [facility];  Service: Orthopedics;  Laterality: Left;  Decompression L5 - S1 on the Left and repair of dura   LUMBAR LAMINECTOMY/DECOMPRESSION MICRODISCECTOMY N/A 07/27/2013   Procedure: DECOMPRESSION L4 - L5 WITH EXCISION OF SYNOVIAL CYST AND, LATERAL MASS FUSION 1 LEVEL;  Surgeon: Johnn Hai, MD;  Location: [facility];  Service: Orthopedics;  Laterality: N/A;   LUMBAR LAMINECTOMY/DECOMPRESSION  MICRODISCECTOMY Left 07/06/2014   Procedure:  MICRO LUMBAR DECOMPRESSION L5-S1 ON LEFT, L4-5 REDO;  Surgeon: Johnn Hai, MD;  Location: [facility];  Service: Orthopedics;  Laterality: Left;   SPINAL CORD STIMULATOR BATTERY EXCHANGE N/A 03/28/2021   Procedure: SPINAL CORD STIMULATOR BATTERY EXCHANGE/REPLACEMENT, L1 KYPHOPLASTY;  Surgeon: Melina Schools, MD;  Location: [place] [facility];  Service: Orthopedics;  Laterality: N/A;  90 MINS LOCAL WITH IV REG   SPINAL CORD STIMULATOR INSERTION N/A 09/11/2016   Procedure: Spinal cord stimulator placement;  Surgeon: Melina Schools, MD;  Location: [place];  Service: Orthopedics;  Laterality: N/A;   TONSILLECTOMY AND ADENOIDECTOMY     TRANSTHORACIC ECHOCARDIOGRAM  09/20/2010   normal LVF, ef 55-65%/  mild MR, TR and PR/  mild LAE   TRANSURETHRAL RESECTION OF BLADDER TUMOR  09/21/2002    reports that he quit smoking about 39 years ago. His smoking use included cigarettes. He has a 40.00 pack-year smoking history. He has never used smokeless tobacco. He reports that he does not currently use alcohol. He reports that he does not use drugs. family history includes Cancer in his brother, father, and sister; Diabetes in his sister; Heart attack in his mother. Allergies  Allergen Reactions   Morphine And Related Other (See Comments)    Migraine    Sulfa Antibiotics Rash and Itching    Patient  Is ok to use paper tape   Hydrochlorothiazide Other (See Comments)    REACTION: hyponatremia with daily use   Tape Itching    Patient  Is ok to use paper tape   Hytrin [Terazosin] Other (See Comments)    Dizziness   Prednisone Other (See Comments)    Caused problem with blood sugar   Latex Rash   Lisinopril Cough   Sulfur Dioxide Itching and Rash   Current Outpatient Medications on File Prior to Visit  Medication Sig Dispense Refill   acetaminophen (TYLENOL) 650 MG CR tablet Take 1,300 mg by mouth every 8 (eight) hours as needed for pain.     albuterol (PROVENTIL) (2.5  MG/3ML) 0.083% nebulizer solution Take 3 mLs (2.5 mg total) by nebulization every 6 (six) hours as needed for wheezing or shortness of breath. 150 mL 1   albuterol (VENTOLIN HFA) 108 (90 Base) MCG/ACT inhaler Inhale 2 puffs into the lungs every 6 (six) hours as needed for wheezing or shortness of breath. 18 g 11   amLODipine (NORVASC) 10 MG tablet Take 1 tablet (10 mg total) by mouth daily. 30 tablet 0   benzonatate (TESSALON) 100 MG capsule TAKE 1 CAPSULE(100 MG) BY MOUTH THREE TIMES DAILY AS NEEDED FOR COUGH 90 capsule 1  brimonidine (ALPHAGAN) 0.15 % ophthalmic solution Place 1 drop into both eyes 2 (two) times daily.     cetirizine (ZYRTEC) 10 MG tablet Take 10 mg by mouth daily.     ciprofloxacin (CIPRO) 500 MG tablet      citalopram (CELEXA) 20 MG tablet TAKE 1 TABLET EVERY DAY 90 tablet 2   clonazePAM (KLONOPIN) 0.5 MG tablet TAKE 1 TABLET(0.5 MG) BY MOUTH TWICE DAILY AS NEEDED FOR ANXIETY 60 tablet 2   clotrimazole (LOTRIMIN) 1 % cream Apply 1 application topically 2 (two) times daily as needed (FOR RASH). Prescribed for rash in groin     dextromethorphan-guaiFENesin (MUCINEX DM) 30-600 MG 12hr tablet Take 1 tablet by mouth 2 (two) times daily.     fluticasone (FLONASE) 50 MCG/ACT nasal spray USE 2 SPRAYS IN EACH NOSTRIL DAILY 48 g 1   furosemide (LASIX) 20 MG tablet TAKE 1 TABLET BY MOUTH TWICE A WEEK ON TUESDAY AND SATURDAY 4 tablet 0   gabapentin (NEURONTIN) 300 MG capsule Take 3-4 capsules (900-1,200 mg total) by mouth at bedtime. 360 capsule 1   hydroxypropyl methylcellulose / hypromellose (ISOPTO TEARS / GONIOVISC) 2.5 % ophthalmic solution Place 1 drop into both eyes 2 (two) times daily.     insulin aspart (NOVOLOG FLEXPEN) 100 UNIT/ML FlexPen Novolog FlexPen U-100 Insulin aspart 100 unit/mL (3 mL) subcutaneous  INJECT 4 UNITS UNDER THE SKIN THREE TIMES DAILY WITH MEALS     Insulin Pen Needle 29G X 10MM MISC Use as directed 200 each 0   lidocaine (LIDODERM) 5 %      meclizine  (ANTIVERT) 25 MG tablet TAKE 1 TABLET TWICE DAILY AS NEEDED FOR DIZZINESS (Patient taking differently: Take 25 mg by mouth 2 (two) times daily.) 180 tablet 2   methocarbamol (ROBAXIN) 500 MG tablet Take 1 tablet by mouth 3 (three) times daily as needed.     nitroGLYCERIN (NITROSTAT) 0.4 MG SL tablet PLACE 1 TABLET UNDER THE TONGUE EVERY 5 MINUTES AS NEEDED FOR CHEST PAIN. MAX 3 DOSES. CALL 911 IF NO IMPROVEMENT AFTER 1ST DOSE 25 tablet 0   omeprazole (PRILOSEC) 40 MG capsule TAKE 1 CAPSULE TWICE DAILY 180 capsule 10   ondansetron (ZOFRAN) 4 MG tablet      potassium chloride (KLOR-CON 10) 10 MEQ tablet Take 1 tablet (10 mEq total) by mouth daily. 90 tablet 3   potassium chloride (KLOR-CON M) 10 MEQ tablet      primidone (MYSOLINE) 250 MG tablet TAKE 1 TABLET (250 MG TOTAL) BY MOUTH 4 (FOUR) TIMES DAILY. (Patient taking differently: Take 125 mg by mouth 3 (three) times daily.) 360 tablet 3   simvastatin (ZOCOR) 40 MG tablet TAKE 1 AND 1/2 TABLETS AT BEDTIME 135 tablet 1   tamsulosin (FLOMAX) 0.4 MG CAPS capsule Take 1 capsule (0.4 mg total) by mouth daily. 90 capsule 3   timolol (TIMOPTIC) 0.5 % ophthalmic solution Place 1 drop into both eyes 2 (two) times daily.   1   Tiotropium Bromide Monohydrate (SPIRIVA RESPIMAT) 2.5 MCG/ACT AERS Inhale 2.5 mcg into the lungs daily.     Travoprost, BAK Free, (TRAVATAN Z) 0.004 % SOLN ophthalmic solution Place 1 drop into both eyes at bedtime. 5 mL 5   triamcinolone (KENALOG) 0.025 % cream Apply 1 application topically 2 (two) times daily as needed (rash).      vitamin B-12 (CYANOCOBALAMIN) 500 MCG tablet Take 500 mcg by mouth daily.     zinc sulfate 220 (50 Zn) MG capsule Take 1 capsule by mouth  daily.     [DISCONTINUED] Insulin Detemir (LEVEMIR) 100 UNIT/ML Pen Inject 5 Units into the skin 2 (two) times daily. (Patient not taking: Reported on 01/19/2020) 6 mL 0   [DISCONTINUED] ipratropium-albuterol (DUONEB) 0.5-2.5 (3) MG/3ML SOLN Take 3 mLs by nebulization every  6 (six) hours as needed. (Patient not taking: Reported on 01/19/2020) 360 mL 0   No current facility-administered medications on file prior to visit.        ROS:  All others reviewed and negative.  Objective        PE:  BP 112/60 (BP Location: Left Arm, Patient Position: Sitting, Cuff Size: Large)   Pulse 62   Temp 98.3 F (36.8 C) (Oral)   Ht '5\' 6"'$  (1.676 m)   Wt 197 lb (89.4 kg)   SpO2 96%   BMI 31.80 kg/m                 Constitutional: Pt appears in NAD               HENT: Head: NCAT.                Right Ear: External ear normal.                 Left Ear: External ear normal. Bilat tm's with mild erythema.  Max sinus areas non tender.  Pharynx with mild erythema, no exudate               Eyes: . Pupils are equal, round, and reactive to light. Conjunctivae and EOM are normal               Nose: without d/c or deformity               Neck: Neck supple. Gross normal ROM               Cardiovascular: Normal rate and regular rhythm.                 Pulmonary/Chest: Effort normal and breath sounds without rales or wheezing.                Abd:  Soft, NT, ND, + BS, no organomegaly               Neurological: Pt is alert. At baseline orientation, motor grossly intact               Skin: Skin is warm. No rashes, no other new lesions, LE edema -none               Psychiatric: Pt behavior is normal without agitation   Micro: none  Cardiac tracings I have personally interpreted today:  none  Pertinent Radiological findings (summarize): none   Lab Results  Component Value Date   WBC 7.4 12/11/2021   HGB 14.1 12/11/2021   HCT 43.0 12/11/2021   PLT 237.0 12/11/2021   GLUCOSE 84 12/11/2021   CHOL 157 12/11/2021   TRIG 186.0 (H) 12/11/2021   HDL 34.00 (L) 12/11/2021   LDLDIRECT 86.0 06/24/2019   LDLCALC 85 12/11/2021   ALT 13 12/11/2021   AST 16 12/11/2021   NA 137 12/11/2021   K 4.2 12/11/2021   CL 101 12/11/2021   CREATININE 0.92 12/11/2021   BUN 18 12/11/2021   CO2 28  12/11/2021   TSH 2.58 12/11/2021   PSA 0.53 11/12/2016   INR 1.1 03/28/2021   HGBA1C 5.6 12/11/2021   Assessment/Plan:  [name] is  a 86 y.o. White or Caucasian [1] male with  has a past medical history of Acute bronchitis (per pt no fever--  cough since discharge from hospital 07-02-2015), Allergic rhinitis, Bladder cancer ([place]), BPH (benign prostatic hyperplasia), Bradycardia, CAD (coronary artery disease), CHF (congestive heart failure) ([place]), Chronic low back pain, COPD (chronic obstructive pulmonary disease) ([place]), DDD (degenerative disc disease), lumbosacral, Depression, Diverticulosis of colon (without mention of hemorrhage), Dyspnea, Essential tremor, Fatty liver disease, nonalcoholic, GERD (gastroesophageal reflux disease), Glaucoma, History of bladder cancer, History of colon polyps, History of exercise stress test, History of hiatal hernia, History of kidney stones, History of peptic ulcer, HLD (hyperlipidemia), HTN (hypertension), Nocturia, Pneumonia, Productive cough, Rheumatoid arthritis(714.0), Right renal artery stenosis (HCC), RLS (restless legs syndrome), Stroke ([place]), Vertigo, intermittent, Weak urinary stream, Wears dentures, Wears glasses, Wears hearing aid, and Wears partial dentures.  Acute upper respiratory infection Mild to mod, for antibx course, zpack, to f/u any worsening symptoms or concerns  Essential hypertension BP Readings from Last 3 Encounters:  06/09/22 112/60  06/02/22 (!) 122/54  12/11/21 120/76   Stable, pt to continue medical treatment norvasc 10 mg qd   Chronic low back pain Stable overall, no new neuro symptoms or signs  - for tramadol refill  Followup: Return in about 6 months (around 12/08/2022).  Cathlean Cower, MD 06/09/2022 8:21 PM [facility]

## 2022-06-09 NOTE — Patient Instructions (Signed)
Your COVID and Flu testing was done today  Please continue all other medications as before, and refills have been done if requested - tramadol  Please have the pharmacy call with any other refills you may need.  Please continue your efforts at being more active, low cholesterol diet, and weight control.  Please keep your appointments with your specialists as you may have planned - [facility]  Please make an Appointment to return in 6 months, or sooner if needed

## 2022-06-09 NOTE — Assessment & Plan Note (Signed)
BP Readings from Last 3 Encounters:  06/09/22 112/60  06/02/22 (!) 122/54  12/11/21 120/76   Stable, pt to continue medical treatment norvasc 10 mg qd

## 2022-06-09 NOTE — Assessment & Plan Note (Signed)
Mild to mod, for antibx course, zpack, to f/u any worsening symptoms or concerns

## 2022-06-10 ENCOUNTER — Other Ambulatory Visit: Payer: Self-pay | Admitting: Internal Medicine

## 2022-07-15 ENCOUNTER — Other Ambulatory Visit: Payer: Self-pay | Admitting: Internal Medicine

## 2022-07-16 ENCOUNTER — Telehealth: Payer: Self-pay | Admitting: Internal Medicine

## 2022-07-16 MED ORDER — GABAPENTIN 300 MG PO CAPS: ORAL_CAPSULE | ORAL | 0 refills | Status: DC

## 2022-07-16 NOTE — Telephone Encounter (Signed)
Patient called and said that he is almost out of his gabapentin - It looks like it was sent to [facility] - He would like at least 2 weeks worth [facility] [place].  Patients:  Number:  [phone]  Last visit:  06/09/2022  Next visit:  12/10/2022

## 2022-07-16 NOTE — Telephone Encounter (Signed)
Ok this is done 

## 2022-07-16 NOTE — Telephone Encounter (Signed)
Patient is requesting two weeks worth of gabapentin until her receives order through mail order pharmacy. If possible [facility] [place]

## 2022-07-22 ENCOUNTER — Telehealth: Payer: Self-pay | Admitting: Cardiology

## 2022-07-22 MED ORDER — FUROSEMIDE 20 MG PO TABS: ORAL_TABLET | ORAL | 3 refills | Status: DC

## 2022-07-22 NOTE — Telephone Encounter (Signed)
*  STAT* If patient is at the pharmacy, call can be transferred to refill team.   1. Which medications need to be refilled? (please list name of each medication and dose if known) furosemide (LASIX) 20 MG tablet   2. Which pharmacy/location (including street and city if local pharmacy) is medication to be sent to[place] [id number] [place]  3. Do they need a 30 day or 90 day supply? 90 day with refills.

## 2022-07-22 NOTE — Addendum Note (Signed)
Addended by: Orma Render on: 07/22/2022 01:38 PM   Modules accepted: Orders

## 2022-07-22 NOTE — Telephone Encounter (Signed)
Medication has been refilled and sent to the desired location. Patients son is aware.

## 2022-07-24 ENCOUNTER — Telehealth: Payer: Self-pay | Admitting: Internal Medicine

## 2022-07-24 ENCOUNTER — Ambulatory Visit: Payer: Medicare Other | Admitting: Internal Medicine

## 2022-07-24 MED ORDER — MECLIZINE HCL 25 MG PO TABS: ORAL_TABLET | ORAL | 3 refills | Status: DC

## 2022-07-24 NOTE — Telephone Encounter (Signed)
MEDICATION: meclizine (ANTIVERT) 25 MG tablet   [facility] [place] [facility] [address]    Comments:   **Let patient know to contact pharmacy at the end of the day to make sure medication is ready. **  ** Please notify patient to allow 48-72 hours to process**  **Encourage patient to contact the pharmacy for refills or they can request refills through [facility]**

## 2022-07-24 NOTE — Telephone Encounter (Signed)
Done erx 

## 2022-08-24 ENCOUNTER — Other Ambulatory Visit: Payer: Self-pay | Admitting: Internal Medicine

## 2022-09-17 ENCOUNTER — Ambulatory Visit (INDEPENDENT_AMBULATORY_CARE_PROVIDER_SITE_OTHER): Payer: Medicare Other | Admitting: Internal Medicine

## 2022-09-17 VITALS — BP 146/92 | HR 54 | Temp 98.0°F | Ht 66.0 in | Wt 198.0 lb

## 2022-09-17 DIAGNOSIS — R7302 Impaired glucose tolerance (oral): Secondary | ICD-10-CM

## 2022-09-17 DIAGNOSIS — R42 Dizziness and giddiness: Secondary | ICD-10-CM | POA: Diagnosis not present

## 2022-09-17 DIAGNOSIS — E559 Vitamin D deficiency, unspecified: Secondary | ICD-10-CM | POA: Diagnosis not present

## 2022-09-17 DIAGNOSIS — E538 Deficiency of other specified B group vitamins: Secondary | ICD-10-CM

## 2022-09-17 DIAGNOSIS — I10 Essential (primary) hypertension: Secondary | ICD-10-CM | POA: Diagnosis not present

## 2022-09-17 DIAGNOSIS — R7303 Prediabetes: Secondary | ICD-10-CM | POA: Insufficient documentation

## 2022-09-17 DIAGNOSIS — H6992 Unspecified Eustachian tube disorder, left ear: Secondary | ICD-10-CM | POA: Diagnosis not present

## 2022-09-17 DIAGNOSIS — E782 Mixed hyperlipidemia: Secondary | ICD-10-CM | POA: Diagnosis not present

## 2022-09-17 DIAGNOSIS — M549 Dorsalgia, unspecified: Secondary | ICD-10-CM | POA: Insufficient documentation

## 2022-09-17 DIAGNOSIS — R1310 Dysphagia, unspecified: Secondary | ICD-10-CM

## 2022-09-17 LAB — URINALYSIS, ROUTINE W REFLEX MICROSCOPIC
Bilirubin Urine: NEGATIVE
Hgb urine dipstick: NEGATIVE
Ketones, ur: NEGATIVE
Leukocytes,Ua: NEGATIVE
Nitrite: NEGATIVE
RBC / HPF: NONE SEEN (ref 0–?)
Specific Gravity, Urine: 1.02 (ref 1.000–1.030)
Total Protein, Urine: NEGATIVE
Urine Glucose: NEGATIVE
Urobilinogen, UA: 0.2 (ref 0.0–1.0)
pH: 6 (ref 5.0–8.0)

## 2022-09-17 LAB — HEPATIC FUNCTION PANEL
ALT: 12 U/L (ref 0–53)
AST: 18 U/L (ref 0–37)
Albumin: 4 g/dL (ref 3.5–5.2)
Alkaline Phosphatase: 92 U/L (ref 39–117)
Bilirubin, Direct: 0.1 mg/dL (ref 0.0–0.3)
Total Bilirubin: 0.4 mg/dL (ref 0.2–1.2)
Total Protein: 6.7 g/dL (ref 6.0–8.3)

## 2022-09-17 LAB — CBC WITH DIFFERENTIAL/PLATELET
Basophils Absolute: 0.1 10*3/uL (ref 0.0–0.1)
Basophils Relative: 1.1 % (ref 0.0–3.0)
Eosinophils Absolute: 0.2 10*3/uL (ref 0.0–0.7)
Eosinophils Relative: 2.9 % (ref 0.0–5.0)
HCT: 38.6 % — ABNORMAL LOW (ref 39.0–52.0)
Hemoglobin: 12.9 g/dL — ABNORMAL LOW (ref 13.0–17.0)
Lymphocytes Relative: 31.2 % (ref 12.0–46.0)
Lymphs Abs: 1.7 10*3/uL (ref 0.7–4.0)
MCHC: 33.4 g/dL (ref 30.0–36.0)
MCV: 80.3 fl (ref 78.0–100.0)
Monocytes Absolute: 0.6 10*3/uL (ref 0.1–1.0)
Monocytes Relative: 10.3 % (ref 3.0–12.0)
Neutro Abs: 2.9 10*3/uL (ref 1.4–7.7)
Neutrophils Relative %: 54.5 % (ref 43.0–77.0)
Platelets: 219 10*3/uL (ref 150.0–400.0)
RBC: 4.81 Mil/uL (ref 4.22–5.81)
RDW: 15.6 % — ABNORMAL HIGH (ref 11.5–15.5)
WBC: 5.4 10*3/uL (ref 4.0–10.5)

## 2022-09-17 LAB — TSH: TSH: 3.95 u[IU]/mL (ref 0.35–5.50)

## 2022-09-17 LAB — HEMOGLOBIN A1C: Hgb A1c MFr Bld: 6 % (ref 4.6–6.5)

## 2022-09-17 LAB — MICROALBUMIN / CREATININE URINE RATIO
Creatinine,U: 143.2 mg/dL
Microalb Creat Ratio: 0.5 mg/g (ref 0.0–30.0)
Microalb, Ur: 0.7 mg/dL (ref 0.0–1.9)

## 2022-09-17 LAB — BASIC METABOLIC PANEL
BUN: 9 mg/dL (ref 6–23)
CO2: 29 mEq/L (ref 19–32)
Calcium: 9.3 mg/dL (ref 8.4–10.5)
Chloride: 103 mEq/L (ref 96–112)
Creatinine, Ser: 0.98 mg/dL (ref 0.40–1.50)
GFR: 70.48 mL/min (ref >60.00)
Glucose, Bld: 85 mg/dL (ref 70–99)
Potassium: 4.1 mEq/L (ref 3.5–5.1)
Sodium: 139 mEq/L (ref 135–145)

## 2022-09-17 LAB — LIPID PANEL
Cholesterol: 175 mg/dL (ref 0–200)
HDL: 36 mg/dL — ABNORMAL LOW (ref >39.00)
LDL Cholesterol: 109 mg/dL — ABNORMAL HIGH (ref 0–99)
NonHDL: 139.31
Total CHOL/HDL Ratio: 5
Triglycerides: 152 mg/dL — ABNORMAL HIGH (ref 0.0–149.0)
VLDL: 30.4 mg/dL (ref 0.0–40.0)

## 2022-09-17 LAB — VITAMIN D 25 HYDROXY (VIT D DEFICIENCY, FRACTURES): VITD: 23.83 ng/mL — ABNORMAL LOW (ref 30.00–100.00)

## 2022-09-17 LAB — VITAMIN B12: Vitamin B-12: 894 pg/mL (ref 211–911)

## 2022-09-17 MED ORDER — MECLIZINE HCL 25 MG PO TABS: 25.0000 mg | ORAL_TABLET | Freq: Three times a day (TID) | ORAL | 1 refills | Status: DC | PRN

## 2022-09-17 NOTE — Progress Notes (Signed)
Patient ID: [name], male   DOB: [birth year], 87 y.o.   MRN: [id number]        Chief Complaint: follow up htn, chronic dizziness, left eustachian valve dysfxn, and dysphagia       HPI:  [name] is a 87 y.o. male here with son for support, also followed at the [place] with recent labs. Pt denies chest pain, increased sob or doe, wheezing, orthopnea, PND, increased LE swelling, palpitations or syncope.  BP had been too low at [place] recently and amlodipine stopped.  Though some high today, BP at home per pt has been controlled in the 120's and 130's.  Also has chronic vertigo , better with meclizine 25 mg (no sedation) at bid but asking for tid.  Also had left ear discomfort, hearing muffled as well.  Has ongoing allergies and congestion, but no recent fever, chills, sinus pain or ear discharge.  Also incidentally has 2-3 mo of recent worsening dysphagia to solids and has a couple of times made himself vomit to relieve this.  Denies worsening reflux, abd pain, bowel change or blood. States he does have access to GI at the [place] and plans to f/u with his [place] for this.         Wt Readings from Last 3 Encounters:  09/17/22 198 lb (89.8 kg)  06/09/22 197 lb (89.4 kg)  06/02/22 202 lb 3.2 oz (91.7 kg)   BP Readings from Last 3 Encounters:  09/17/22 (!) 146/92  06/09/22 112/60  06/02/22 (!) 122/54         Past Medical History:  Diagnosis Date   Acute bronchitis per pt no fever--  cough since discharge from hospital 07-02-2015   per pcp note 07-05-2015  mild to moderate bronchitis vs pna   Allergic rhinitis    Bladder cancer (HCC)    BPH (benign prostatic hyperplasia)    Bradycardia    CAD (coronary artery disease)    CHF (congestive heart failure) (HCC)    Chronic low back pain    COPD (chronic obstructive pulmonary disease) (HCC)    DDD (degenerative disc disease), lumbosacral    Depression    Diverticulosis of colon (without mention of hemorrhage)    Dyspnea    Essential tremor    Fatty  liver disease, nonalcoholic    GERD (gastroesophageal reflux disease)    Glaucoma    History of bladder cancer    2004--  TCC low-grade , non-invasive   History of colon polyps    History of exercise stress test    02-05-2007-- ETT (Clinically positive, electrically equivocal, submaximal ETT,  appropriate bp response to exercise   History of hiatal hernia    History of kidney stones    History of peptic ulcer    HLD (hyperlipidemia)    HTN (hypertension)    Nocturia    Pneumonia    Productive cough    Rheumatoid arthritis(714.0)    Right renal artery stenosis ([place])    per cath 03-14-2009 --- 40-50%   RLS (restless legs syndrome)    Stroke (HCC)    TIA   Vertigo, intermittent    Weak urinary stream    Wears dentures    upper   Wears glasses    Wears hearing aid    bilateral   Wears partial dentures    lower   Past Surgical History:  Procedure Laterality Date   CARDIAC CATHETERIZATION  02-12-2007  dr gregg taylor   Non-obstructive CAD,  20% pLCX,  20% LAD, ef 65%   CARDIAC CATHETERIZATION  03-14-2009  dr Johnsie Cancel   pLAD and mid diaginal 20-30% multiple lesions,  OM1 20%, right renal ostial 40-50%,  ef 60%   CATARACT EXTRACTION W/ INTRAOCULAR LENS  IMPLANT, BILATERAL  07/14/2014   CYSTOSCOPY WITH BIOPSY N/A 07/11/2015   Procedure: CYSTOSCOPY WITH COLD CUP RESECTION AND FULGERATION;  Surgeon: Rana Snare, MD;  Location: [facility];  Service: Urology;  Laterality: N/A;   HIP ARTHROSCOPY W/ LABRAL DEBRIDEMENT Left 06/29/2000   and chondraplasty   JOINT REPLACEMENT     right big toe replacement   KYPHOPLASTY N/A 03/28/2021   Procedure: KYPHOPLASTY lumbar one;  Surgeon: Melina Schools, MD;  Location: [facility];  Service: Orthopedics;  Laterality: N/A;   LUMBAR [facility] SURGERY  07/16/2005   LUMBAR LAMINECTOMY/DECOMPRESSION MICRODISCECTOMY  07/09/2011   Procedure: LUMBAR LAMINECTOMY/DECOMPRESSION MICRODISCECTOMY;  Surgeon: Johnn Hai;  Location: [facility];  Service:  Orthopedics;  Laterality: Left;  Decompression L5 - S1 on the Left and repair of dura   LUMBAR LAMINECTOMY/DECOMPRESSION MICRODISCECTOMY N/A 07/27/2013   Procedure: DECOMPRESSION L4 - L5 WITH EXCISION OF SYNOVIAL CYST AND, LATERAL MASS FUSION 1 LEVEL;  Surgeon: Johnn Hai, MD;  Location: [facility];  Service: Orthopedics;  Laterality: N/A;   LUMBAR LAMINECTOMY/DECOMPRESSION MICRODISCECTOMY Left 07/06/2014   Procedure:  MICRO LUMBAR DECOMPRESSION L5-S1 ON LEFT, L4-5 REDO;  Surgeon: Johnn Hai, MD;  Location: [facility];  Service: Orthopedics;  Laterality: Left;   SPINAL CORD STIMULATOR BATTERY EXCHANGE N/A 03/28/2021   Procedure: SPINAL CORD STIMULATOR BATTERY EXCHANGE/REPLACEMENT, L1 KYPHOPLASTY;  Surgeon: Melina Schools, MD;  Location: [facility];  Service: Orthopedics;  Laterality: N/A;  90 MINS LOCAL WITH IV REG   SPINAL CORD STIMULATOR INSERTION N/A 09/11/2016   Procedure: Spinal cord stimulator placement;  Surgeon: Melina Schools, MD;  Location: [place];  Service: Orthopedics;  Laterality: N/A;   TONSILLECTOMY AND ADENOIDECTOMY     TRANSTHORACIC ECHOCARDIOGRAM  09/20/2010   normal LVF, ef 55-65%/  mild MR, TR and PR/  mild LAE   TRANSURETHRAL RESECTION OF BLADDER TUMOR  09/21/2002    reports that he quit smoking about 39 years ago. His smoking use included cigarettes. He has a 40.00 pack-year smoking history. He has never used smokeless tobacco. He reports that he does not currently use alcohol. He reports that he does not use drugs. family history includes Cancer in his brother, father, and sister; Diabetes in his sister; Heart attack in his mother. Allergies  Allergen Reactions   Morphine And Related Other (See Comments)    Migraine    Sulfa Antibiotics Rash and Itching    Patient  Is ok to use paper tape   Hydrochlorothiazide Other (See Comments)    REACTION: hyponatremia with daily use   Tape Itching    Patient  Is ok to use paper tape   Hytrin [Terazosin] Other (See Comments)     Dizziness   Prednisone Other (See Comments)    Caused problem with blood sugar   Latex Rash   Lisinopril Cough   Sulfur Dioxide Itching and Rash   Current Outpatient Medications on File Prior to Visit  Medication Sig Dispense Refill   acetaminophen (TYLENOL) 650 MG CR tablet Take 1,300 mg by mouth every 8 (eight) hours as needed for pain.     albuterol (PROVENTIL) (2.5 MG/3ML) 0.083% nebulizer solution Take 3 mLs (2.5 mg total) by nebulization every 6 (six) hours as needed for wheezing or shortness  of breath. 150 mL 1   albuterol (VENTOLIN HFA) 108 (90 Base) MCG/ACT inhaler Inhale 2 puffs into the lungs every 6 (six) hours as needed for wheezing or shortness of breath. 18 g 11   amLODipine (NORVASC) 10 MG tablet TAKE 1 TABLET EVERY DAY 90 tablet 3   benzonatate (TESSALON) 100 MG capsule TAKE 1 CAPSULE(100 MG) BY MOUTH THREE TIMES DAILY AS NEEDED FOR COUGH 90 capsule 1   brimonidine (ALPHAGAN) 0.15 % ophthalmic solution Place 1 drop into both eyes 2 (two) times daily.     cetirizine (ZYRTEC) 10 MG tablet Take 10 mg by mouth daily.     citalopram (CELEXA) 20 MG tablet TAKE 1 TABLET EVERY DAY 90 tablet 2   clonazePAM (KLONOPIN) 0.5 MG tablet TAKE 1 TABLET(0.5 MG) BY MOUTH TWICE DAILY AS NEEDED FOR ANXIETY 60 tablet 2   clotrimazole (LOTRIMIN) 1 % cream Apply 1 application topically 2 (two) times daily as needed (FOR RASH). Prescribed for rash in groin     dextromethorphan-guaiFENesin (MUCINEX DM) 30-600 MG 12hr tablet Take 1 tablet by mouth 2 (two) times daily.     fluticasone (FLONASE) 50 MCG/ACT nasal spray USE 2 SPRAYS IN EACH NOSTRIL DAILY 48 g 1   furosemide (LASIX) 20 MG tablet TAKE 1 TABLET BY MOUTH TWICE A WEEK ON TUESDAY AND SATURDAY 30 tablet 3   gabapentin (NEURONTIN) 300 MG capsule TAKE 3-4 CAPSULES AT BEDTIME 360 capsule 1   hydrALAZINE (APRESOLINE) 10 MG tablet TAKE 1 TABLET THREE TIMES DAILY AS NEEDED FOR BLOOD PRESSURE GREATER THAN 150/90 270 tablet 2   hydroxypropyl  methylcellulose / hypromellose (ISOPTO TEARS / GONIOVISC) 2.5 % ophthalmic solution Place 1 drop into both eyes 2 (two) times daily.     insulin aspart (NOVOLOG FLEXPEN) 100 UNIT/ML FlexPen Novolog FlexPen U-100 Insulin aspart 100 unit/mL (3 mL) subcutaneous  INJECT 4 UNITS UNDER THE SKIN THREE TIMES DAILY WITH MEALS     Insulin Pen Needle 29G X 10MM MISC Use as directed 200 each 0   lidocaine (LIDODERM) 5 %      losartan (COZAAR) 100 MG tablet Take 1 tablet (100 mg total) by mouth daily. 90 tablet 2   methocarbamol (ROBAXIN) 500 MG tablet Take 1 tablet by mouth 3 (three) times daily as needed.     nitroGLYCERIN (NITROSTAT) 0.4 MG SL tablet PLACE 1 TABLET UNDER THE TONGUE EVERY 5 MINUTES AS NEEDED FOR CHEST PAIN. MAX 3 DOSES. CALL 911 IF NO IMPROVEMENT AFTER 1ST DOSE 25 tablet 0   omeprazole (PRILOSEC) 40 MG capsule TAKE 1 CAPSULE TWICE DAILY 180 capsule 10   ondansetron (ZOFRAN) 4 MG tablet      potassium chloride (KLOR-CON 10) 10 MEQ tablet Take 1 tablet (10 mEq total) by mouth daily. 90 tablet 3   potassium chloride (KLOR-CON M) 10 MEQ tablet      primidone (MYSOLINE) 250 MG tablet TAKE 1 TABLET (250 MG TOTAL) BY MOUTH 4 (FOUR) TIMES DAILY. (Patient taking differently: Take 125 mg by mouth 3 (three) times daily.) 360 tablet 3   simvastatin (ZOCOR) 40 MG tablet TAKE 1 AND 1/2 TABLETS AT BEDTIME 135 tablet 1   tamsulosin (FLOMAX) 0.4 MG CAPS capsule Take 1 capsule (0.4 mg total) by mouth daily. 90 capsule 3   timolol (TIMOPTIC) 0.5 % ophthalmic solution Place 1 drop into both eyes 2 (two) times daily.   1   Tiotropium Bromide Monohydrate (SPIRIVA RESPIMAT) 2.5 MCG/ACT AERS Inhale 2.5 mcg into the lungs daily.  traMADol (ULTRAM) 50 MG tablet Take 1 tablet (50 mg total) by mouth every 6 (six) hours as needed. 120 tablet 2   Travoprost, BAK Free, (TRAVATAN Z) 0.004 % SOLN ophthalmic solution Place 1 drop into both eyes at bedtime. 5 mL 5   triamcinolone (KENALOG) 0.025 % cream Apply 1 application  topically 2 (two) times daily as needed (rash).      vitamin B-12 (CYANOCOBALAMIN) 500 MCG tablet Take 500 mcg by mouth daily.     zinc sulfate 220 (50 Zn) MG capsule Take 1 capsule by mouth daily.     [DISCONTINUED] Insulin Detemir (LEVEMIR) 100 UNIT/ML Pen Inject 5 Units into the skin 2 (two) times daily. (Patient not taking: Reported on 01/19/2020) 6 mL 0   [DISCONTINUED] ipratropium-albuterol (DUONEB) 0.5-2.5 (3) MG/3ML SOLN Take 3 mLs by nebulization every 6 (six) hours as needed. (Patient not taking: Reported on 01/19/2020) 360 mL 0   No current facility-administered medications on file prior to visit.        ROS:  All others reviewed and negative.  Objective        PE:  BP (!) 146/92 (BP Location: Right Arm, Patient Position: Sitting, Cuff Size: Large)   Pulse (!) 54   Temp 98 F (36.7 C) (Oral)   Ht '5\' 6"'$  (1.676 m)   Wt 198 lb (89.8 kg)   SpO2 96%   BMI 31.96 kg/m                 Constitutional: Pt appears in NAD               HENT: Head: NCAT.                Right Ear: External ear normal.                 Left Ear: External ear normal. Bilat Tms clear without erythema               Eyes: . Pupils are equal, round, and reactive to light. Conjunctivae and EOM are normal               Nose: without d/c or deformity               Neck: Neck supple. Gross normal ROM               Cardiovascular: Normal rate and regular rhythm.                 Pulmonary/Chest: Effort normal and breath sounds without rales or wheezing.                Abd:  Soft, NT, ND, + BS, no organomegaly               Neurological: Pt is alert. At baseline orientation, motor grossly intact               Skin: Skin is warm. No rashes, no other new lesions, LE edema - trace bilaterl               Psychiatric: Pt behavior is normal without agitation   Micro: none  Cardiac tracings I have personally interpreted today:  none  Pertinent Radiological findings (summarize): none   Lab Results  Component Value Date    WBC 5.4 09/17/2022   HGB 12.9 (L) 09/17/2022   HCT 38.6 (L) 09/17/2022   PLT 219.0 09/17/2022   GLUCOSE 85 09/17/2022   CHOL 175 09/17/2022  TRIG 152.0 (H) 09/17/2022   HDL 36.00 (L) 09/17/2022   LDLDIRECT 86.0 06/24/2019   LDLCALC 109 (H) 09/17/2022   ALT 12 09/17/2022   AST 18 09/17/2022   NA 139 09/17/2022   K 4.1 09/17/2022   CL 103 09/17/2022   CREATININE 0.98 09/17/2022   BUN 9 09/17/2022   CO2 29 09/17/2022   TSH 3.95 09/17/2022   PSA 0.53 11/12/2016   INR 1.1 03/28/2021   HGBA1C 6.0 09/17/2022   MICROALBUR 0.7 09/17/2022   Assessment/Plan:  [name] is a 87 y.o. White or Caucasian [1] male with  has a past medical history of Acute bronchitis (per pt no fever--  cough since discharge from hospital 07-02-2015), Allergic rhinitis, Bladder cancer ([place]), BPH (benign prostatic hyperplasia), Bradycardia, CAD (coronary artery disease), CHF (congestive heart failure) ([place]), Chronic low back pain, COPD (chronic obstructive pulmonary disease) ([place]), DDD (degenerative disc disease), lumbosacral, Depression, Diverticulosis of colon (without mention of hemorrhage), Dyspnea, Essential tremor, Fatty liver disease, nonalcoholic, GERD (gastroesophageal reflux disease), Glaucoma, History of bladder cancer, History of colon polyps, History of exercise stress test, History of hiatal hernia, History of kidney stones, History of peptic ulcer, HLD (hyperlipidemia), HTN (hypertension), Nocturia, Pneumonia, Productive cough, Rheumatoid arthritis(714.0), Right renal artery stenosis (HCC), RLS (restless legs syndrome), Stroke ([place]), Vertigo, intermittent, Weak urinary stream, Wears dentures, Wears glasses, Wears hearing aid, and Wears partial dentures.  Dizziness Chronic persistent, ok for increased meclizine 25 tid prn  Impaired glucose tolerance Lab Results  Component Value Date   HGBA1C 6.0 09/17/2022   Stable, pt to continue current medical treatment  - insulin  Hyperlipidemia Lab  Results  Component Value Date   LDLCALC 109 (H) 09/17/2022   uncontrolled, pt to continue current statin zocor 40 mg - 1.5 tab qd, declines statin change for now   Essential hypertension BP Readings from Last 3 Encounters:  09/17/22 (!) 146/92  06/09/22 112/60  06/02/22 (!) 122/54   Uncontrolled here, but pt states controlled at home, pt  pt to continue medical treatment norvasc 10 mg qd, hydralazine 10 tid, losartan 100 mg qd as declines change   Dysphagia Pt states will f/u with pcp at [facility] for GI referral  Dysfunction of left eustachian tube Pt encouraged for mucinex bid prn  Followup: No follow-ups on file.  Cathlean Cower, MD 09/19/2022 5:07 AM [facility]

## 2022-09-17 NOTE — Patient Instructions (Addendum)
Ok to continue the same treatment for BP   Ok for meclizine refill  Ok to try Mucinex for the left ear pain and pressure  Please remember to ask your PCP at the [facility] for GI referral the trouble swallowing, or call her for referral if you like ' Please continue all other medications as before, and refills have been done if requested.  Please have the pharmacy call with any other refills you may need.  Please continue your efforts at being more active, low cholesterol diet, and weight control.  You are otherwise up to date with prevention measures today.  Please keep your appointments with your specialists as you may have planned  Please go to the LAB at the blood drawing area for the tests to be done  You will be contacted by phone if any changes need to be made immediately.  Otherwise, you will receive a letter about your results with an explanation, but please check with MyChart first.  Please remember to sign up for MyChart if you have not done so, as this will be important to you in the future with finding out test results, communicating by private email, and scheduling acute appointments online when needed.  Please make an Appointment to return in 6 months, or sooner if needed

## 2022-09-19 ENCOUNTER — Encounter: Payer: Self-pay | Admitting: Internal Medicine

## 2022-09-19 DIAGNOSIS — H6992 Unspecified Eustachian tube disorder, left ear: Secondary | ICD-10-CM | POA: Insufficient documentation

## 2022-09-19 DIAGNOSIS — R1310 Dysphagia, unspecified: Secondary | ICD-10-CM | POA: Insufficient documentation

## 2022-09-19 NOTE — Assessment & Plan Note (Signed)
BP Readings from Last 3 Encounters:  09/17/22 (!) 146/92  06/09/22 112/60  06/02/22 (!) 122/54   Uncontrolled here, but pt states controlled at home, pt  pt to continue medical treatment norvasc 10 mg qd, hydralazine 10 tid, losartan 100 mg qd as declines change

## 2022-09-19 NOTE — Assessment & Plan Note (Signed)
Lab Results  Component Value Date   LDLCALC 109 (H) 09/17/2022   uncontrolled, pt to continue current statin zocor 40 mg - 1.5 tab qd, declines statin change for now

## 2022-09-19 NOTE — Assessment & Plan Note (Signed)
Pt states will f/u with pcp at [facility] for GI referral

## 2022-09-19 NOTE — Assessment & Plan Note (Signed)
Chronic persistent, ok for increased meclizine 25 tid prn

## 2022-09-19 NOTE — Assessment & Plan Note (Signed)
Pt encouraged for mucinex bid prn

## 2022-09-19 NOTE — Assessment & Plan Note (Signed)
Lab Results  Component Value Date   HGBA1C 6.0 09/17/2022   Stable, pt to continue current medical treatment  - insulin

## 2022-11-10 ENCOUNTER — Ambulatory Visit (INDEPENDENT_AMBULATORY_CARE_PROVIDER_SITE_OTHER): Payer: Medicare Other | Admitting: Family Medicine

## 2022-11-10 ENCOUNTER — Telehealth: Payer: Self-pay | Admitting: Internal Medicine

## 2022-11-10 ENCOUNTER — Encounter: Payer: Self-pay | Admitting: Family Medicine

## 2022-11-10 ENCOUNTER — Other Ambulatory Visit: Payer: Self-pay | Admitting: Family Medicine

## 2022-11-10 VITALS — Wt 196.0 lb

## 2022-11-10 DIAGNOSIS — K146 Glossodynia: Secondary | ICD-10-CM

## 2022-11-10 DIAGNOSIS — B370 Candidal stomatitis: Secondary | ICD-10-CM

## 2022-11-10 LAB — CBC WITH DIFFERENTIAL/PLATELET
Basophils Absolute: 0 10*3/uL (ref 0.0–0.1)
Basophils Relative: 0.7 % (ref 0.0–3.0)
Eosinophils Absolute: 0.2 10*3/uL (ref 0.0–0.7)
Eosinophils Relative: 3.2 % (ref 0.0–5.0)
HCT: 39.9 % (ref 39.0–52.0)
Hemoglobin: 13.1 g/dL (ref 13.0–17.0)
Lymphocytes Relative: 28.6 % (ref 12.0–46.0)
Lymphs Abs: 1.7 10*3/uL (ref 0.7–4.0)
MCHC: 32.9 g/dL (ref 30.0–36.0)
MCV: 82 fl (ref 78.0–100.0)
Monocytes Absolute: 0.6 10*3/uL (ref 0.1–1.0)
Monocytes Relative: 10.5 % (ref 3.0–12.0)
Neutro Abs: 3.4 10*3/uL (ref 1.4–7.7)
Neutrophils Relative %: 57 % (ref 43.0–77.0)
Platelets: 218 10*3/uL (ref 150.0–400.0)
RBC: 4.86 Mil/uL (ref 4.22–5.81)
RDW: 16.6 % — ABNORMAL HIGH (ref 11.5–15.5)
WBC: 5.9 10*3/uL (ref 4.0–10.5)

## 2022-11-10 LAB — VITAMIN B12: Vitamin B-12: 1481 pg/mL — ABNORMAL HIGH (ref 211–911)

## 2022-11-10 MED ORDER — MECLIZINE HCL 25 MG PO TABS: 25.0000 mg | ORAL_TABLET | Freq: Three times a day (TID) | ORAL | 1 refills | Status: DC | PRN

## 2022-11-10 MED ORDER — NYSTATIN 100000 UNIT/ML MT SUSP: 5.0000 mL | Freq: Four times a day (QID) | OROMUCOSAL | 0 refills | Status: DC

## 2022-11-10 NOTE — Telephone Encounter (Signed)
  nystatin (MYCOSTATIN) 100000 UNIT/ML suspension        Changed from: magic mouthwash (nystatin, lidocaine, diphenhydrAMINE, alum & mag hydroxide) suspension   Pharmacy comment: Alternative Requested:COMPOUND UNABLE TO BE MADE DUE TO UNAVAILABILITY OF INGREDIENTS.

## 2022-11-10 NOTE — Progress Notes (Signed)
Assessment & Plan:  1. Thrush Education provided on thrush.  Continue rinsing mouth after using inhaler and proper denture care. - magic mouthwash (nystatin, lidocaine, diphenhydrAMINE, alum & mag hydroxide) suspension; Swish and swallow 5 mLs 4 (four) times daily.  Dispense: 180 mL; Refill: 0  2. Soreness of tongue - Vitamin B12 - CBC with Differential/Platelet - magic mouthwash (nystatin, lidocaine, diphenhydrAMINE, alum & mag hydroxide) suspension; Swish and swallow 5 mLs 4 (four) times daily.  Dispense: 180 mL; Refill: 0   Follow up plan: Return if symptoms worsen or fail to improve.  Casey Boston, MSN, APRN, FNP-C  Subjective:  HPI: [name] is a 87 y.o. male presenting on 11/10/2022 for Thrush (Mouth sore  - broke out, soreness, acid foods make it feel worse)  Patient is accompanied by his son, who he is okay with being present.  He reports his mouth is very sore and his voice comes and goes.  States has been like this for almost a year, but the past month has been really bad.  Food sets his mouth, but mostly his tongue, on fire.  He has never been given any medications to treat.  He does use Spiriva daily and rinses his mouth with warm water afterwards.  He also uses an antiseptic mouthwash, which also burns his mouth.  He does wear dentures and cleans them appropriately.    ROS: Negative unless specifically indicated above in HPI.   Relevant past medical history reviewed and updated as indicated.   Allergies and medications reviewed and updated.   Current Outpatient Medications:    acetaminophen (TYLENOL) 650 MG CR tablet, Take 1,300 mg by mouth every 8 (eight) hours as needed for pain., Disp: , Rfl:    albuterol (VENTOLIN HFA) 108 (90 Base) MCG/ACT inhaler, Inhale 2 puffs into the lungs every 6 (six) hours as needed for wheezing or shortness of breath., Disp: 18 g, Rfl: 11   amLODipine (NORVASC) 10 MG tablet, TAKE 1 TABLET EVERY DAY, Disp: 90 tablet, Rfl: 3    brimonidine (ALPHAGAN) 0.15 % ophthalmic solution, Place 1 drop into both eyes 2 (two) times daily., Disp: , Rfl:    cetirizine (ZYRTEC) 10 MG tablet, Take 10 mg by mouth daily., Disp: , Rfl:    citalopram (CELEXA) 20 MG tablet, TAKE 1 TABLET EVERY DAY, Disp: 90 tablet, Rfl: 2   clonazePAM (KLONOPIN) 0.5 MG tablet, TAKE 1 TABLET(0.5 MG) BY MOUTH TWICE DAILY AS NEEDED FOR ANXIETY, Disp: 60 tablet, Rfl: 2   clotrimazole (LOTRIMIN) 1 % cream, Apply 1 application topically 2 (two) times daily as needed (FOR [name]). Prescribed for [name] in groin, Disp: , Rfl:    furosemide (LASIX) 20 MG tablet, TAKE 1 TABLET BY MOUTH TWICE A WEEK ON TUESDAY AND SATURDAY, Disp: 30 tablet, Rfl: 3   gabapentin (NEURONTIN) 300 MG capsule, TAKE 3-4 CAPSULES AT BEDTIME, Disp: 360 capsule, Rfl: 1   hydrALAZINE (APRESOLINE) 10 MG tablet, TAKE 1 TABLET THREE TIMES DAILY AS NEEDED FOR BLOOD PRESSURE GREATER THAN 150/90, Disp: 270 tablet, Rfl: 2   hydroxypropyl methylcellulose / hypromellose (ISOPTO TEARS / GONIOVISC) 2.5 % ophthalmic solution, Place 1 drop into both eyes 2 (two) times daily., Disp: , Rfl:    losartan (COZAAR) 100 MG tablet, Take 1 tablet (100 mg total) by mouth daily., Disp: 90 tablet, Rfl: 2   omeprazole (PRILOSEC) 40 MG capsule, TAKE 1 CAPSULE TWICE DAILY, Disp: 180 capsule, Rfl: 10   potassium chloride (KLOR-CON 10) 10 MEQ tablet, Take  1 tablet (10 mEq total) by mouth daily., Disp: 90 tablet, Rfl: 3   primidone (MYSOLINE) 250 MG tablet, TAKE 1 TABLET (250 MG TOTAL) BY MOUTH 4 (FOUR) TIMES DAILY. (Patient taking differently: Take 125 mg by mouth 3 (three) times daily.), Disp: 360 tablet, Rfl: 3   simvastatin (ZOCOR) 40 MG tablet, TAKE 1 AND 1/2 TABLETS AT BEDTIME, Disp: 135 tablet, Rfl: 1   timolol (TIMOPTIC) 0.5 % ophthalmic solution, Place 1 drop into both eyes 2 (two) times daily. , Disp: , Rfl: 1   Tiotropium Bromide Monohydrate (SPIRIVA RESPIMAT) 2.5 MCG/ACT AERS, Inhale 2.5 mcg into the lungs daily., Disp: ,  Rfl:    Travoprost, BAK Free, (TRAVATAN Z) 0.004 % SOLN ophthalmic solution, Place 1 drop into both eyes at bedtime., Disp: 5 mL, Rfl: 5   triamcinolone (KENALOG) 0.025 % cream, Apply 1 application topically 2 (two) times daily as needed ([name]). , Disp: , Rfl:    vitamin B-12 (CYANOCOBALAMIN) 500 MCG tablet, Take 500 mcg by mouth daily., Disp: , Rfl:    albuterol (PROVENTIL) (2.5 MG/3ML) 0.083% nebulizer solution, Take 3 mLs (2.5 mg total) by nebulization every 6 (six) hours as needed for wheezing or shortness of breath. (Patient not taking: Reported on 11/10/2022), Disp: 150 mL, Rfl: 1   fluticasone (FLONASE) 50 MCG/ACT nasal spray, USE 2 SPRAYS IN EACH NOSTRIL DAILY (Patient not taking: Reported on 11/10/2022), Disp: 48 g, Rfl: 1   meclizine (ANTIVERT) 25 MG tablet, Take 1 tablet (25 mg total) by mouth 3 (three) times daily as needed for dizziness., Disp: 270 tablet, Rfl: 1   nitroGLYCERIN (NITROSTAT) 0.4 MG SL tablet, PLACE 1 TABLET UNDER THE TONGUE EVERY 5 MINUTES AS NEEDED FOR CHEST PAIN. MAX 3 DOSES. CALL 911 IF NO IMPROVEMENT AFTER 1ST DOSE (Patient not taking: Reported on 11/10/2022), Disp: 25 tablet, Rfl: 0   ondansetron (ZOFRAN) 4 MG tablet, , Disp: , Rfl:    traMADol (ULTRAM) 50 MG tablet, Take 1 tablet (50 mg total) by mouth every 6 (six) hours as needed. (Patient not taking: Reported on 11/10/2022), Disp: 120 tablet, Rfl: 2  Allergies  Allergen Reactions   Morphine And Related Other (See Comments)    Migraine    Sulfa Antibiotics [name] and Itching    Patient  Is ok to use paper tape   Hydrochlorothiazide Other (See Comments)    REACTION: hyponatremia with daily use   Tape Itching    Patient  Is ok to use paper tape   Hytrin [Terazosin] Other (See Comments)    Dizziness   Prednisone Other (See Comments)    Caused problem with blood sugar   Latex [name]   Lisinopril Cough   Sulfur Dioxide Itching and [name]    Objective:   Wt 196 lb (88.9 kg)   BMI 31.64 kg/m    Physical  Exam Vitals reviewed.  Constitutional:      General: He is not in acute distress.    Appearance: Normal appearance. He is not ill-appearing, toxic-appearing or diaphoretic.  HENT:     Head: Normocephalic and atraumatic.     Mouth/Throat:     Mouth: Oral lesions (small white patches on gums consistent with thrush) present.     Comments: Xerostomia Eyes:     General: No scleral icterus.       Right eye: No discharge.        Left eye: No discharge.     Conjunctiva/sclera: Conjunctivae normal.  Cardiovascular:     Rate and Rhythm:  Normal rate.  Pulmonary:     Effort: Pulmonary effort is normal. No respiratory distress.  Musculoskeletal:        General: Normal range of motion.     Cervical back: Normal range of motion.  Skin:    General: Skin is warm and dry.  Neurological:     Mental Status: He is alert and oriented to person, place, and time. Mental status is at baseline.     Gait: Gait abnormal (ambulates with rolling walker).  Psychiatric:        Mood and Affect: Mood normal.        Behavior: Behavior normal.        Thought Content: Thought content normal.        Judgment: Judgment normal.

## 2022-11-10 NOTE — Addendum Note (Signed)
Addended by: Deliah Boston F on: 11/10/2022 04:00 PM   Modules accepted: Orders

## 2022-11-10 NOTE — Telephone Encounter (Signed)
Patient is requesting transfer of care from Dr. Jonny Ruiz to [facility]. Please advise.

## 2022-11-10 NOTE — Telephone Encounter (Signed)
Ok with me 

## 2022-11-25 ENCOUNTER — Other Ambulatory Visit: Payer: Self-pay | Admitting: Internal Medicine

## 2022-12-03 NOTE — Telephone Encounter (Signed)
Dr. Okey Dupre - would you be willing to take this patient?

## 2022-12-04 NOTE — Telephone Encounter (Signed)
I can take but would not be able to maintain both clonazepam and tramadol together so if he desires transfer to me he would need to decide which he would be willing to wean off of. Or if not desired ok to not transfer to me. Either way needs to keep current PCP until [facility] visit.

## 2022-12-10 ENCOUNTER — Other Ambulatory Visit: Payer: Self-pay | Admitting: Internal Medicine

## 2022-12-10 ENCOUNTER — Ambulatory Visit: Payer: Medicare Other | Admitting: Internal Medicine

## 2022-12-11 ENCOUNTER — Other Ambulatory Visit: Payer: Self-pay

## 2022-12-11 ENCOUNTER — Telehealth: Payer: Self-pay | Admitting: Internal Medicine

## 2022-12-11 MED ORDER — MECLIZINE HCL 25 MG PO TABS: 25.0000 mg | ORAL_TABLET | Freq: Three times a day (TID) | ORAL | 1 refills | Status: DC | PRN

## 2022-12-11 NOTE — Telephone Encounter (Signed)
Prescription Request  12/11/2022  LOV: 09/17/2022  What is the name of the medication or equipment? meclizine (ANTIVERT) 25 MG tablet   Have you contacted your pharmacy to request a refill? No   Which pharmacy would you like this sent to?  [facility] - [place], [place] - [address] [place] [address] Phone: [phone] Fax: [phone]     Patient notified that their request is being sent to the clinical staff for review and that they should receive a response within 2 business days.   Please advise at [facility] [phone]      Patient wants 3 month refill, if possible.

## 2022-12-11 NOTE — Telephone Encounter (Signed)
I think came to me by mistake

## 2022-12-11 NOTE — Telephone Encounter (Signed)
Refill Sent. 

## 2022-12-11 NOTE — Telephone Encounter (Signed)
Patient has been scheduled for [facility] appointment with Dr. Okey Dupre on 03/02/2023. They said they would like to discuss which medication to wean off of at the appointment because they are unsure which would be best.

## 2022-12-13 ENCOUNTER — Other Ambulatory Visit: Payer: Self-pay | Admitting: Internal Medicine

## 2022-12-15 ENCOUNTER — Other Ambulatory Visit: Payer: Self-pay

## 2022-12-15 MED ORDER — FUROSEMIDE 20 MG PO TABS: ORAL_TABLET | ORAL | 1 refills | Status: DC

## 2023-01-10 ENCOUNTER — Other Ambulatory Visit: Payer: Self-pay | Admitting: Internal Medicine

## 2023-02-11 ENCOUNTER — Encounter (INDEPENDENT_AMBULATORY_CARE_PROVIDER_SITE_OTHER): Payer: Self-pay

## 2023-02-22 ENCOUNTER — Other Ambulatory Visit: Payer: Self-pay | Admitting: Internal Medicine

## 2023-02-23 ENCOUNTER — Other Ambulatory Visit: Payer: Self-pay

## 2023-03-02 ENCOUNTER — Encounter: Payer: Self-pay | Admitting: Internal Medicine

## 2023-03-02 ENCOUNTER — Other Ambulatory Visit: Payer: Self-pay | Admitting: Internal Medicine

## 2023-03-02 ENCOUNTER — Ambulatory Visit (INDEPENDENT_AMBULATORY_CARE_PROVIDER_SITE_OTHER): Payer: Medicare Other | Admitting: Internal Medicine

## 2023-03-02 VITALS — BP 140/60 | HR 58 | Temp 98.3°F | Ht 66.0 in | Wt 199.0 lb

## 2023-03-02 DIAGNOSIS — R7303 Prediabetes: Secondary | ICD-10-CM | POA: Diagnosis not present

## 2023-03-02 DIAGNOSIS — G8929 Other chronic pain: Secondary | ICD-10-CM

## 2023-03-02 DIAGNOSIS — J8410 Pulmonary fibrosis, unspecified: Secondary | ICD-10-CM

## 2023-03-02 DIAGNOSIS — Z8551 Personal history of malignant neoplasm of bladder: Secondary | ICD-10-CM

## 2023-03-02 DIAGNOSIS — M069 Rheumatoid arthritis, unspecified: Secondary | ICD-10-CM | POA: Diagnosis not present

## 2023-03-02 DIAGNOSIS — I10 Essential (primary) hypertension: Secondary | ICD-10-CM

## 2023-03-02 DIAGNOSIS — M545 Low back pain, unspecified: Secondary | ICD-10-CM

## 2023-03-02 DIAGNOSIS — K219 Gastro-esophageal reflux disease without esophagitis: Secondary | ICD-10-CM

## 2023-03-02 DIAGNOSIS — G250 Essential tremor: Secondary | ICD-10-CM

## 2023-03-02 DIAGNOSIS — K449 Diaphragmatic hernia without obstruction or gangrene: Secondary | ICD-10-CM

## 2023-03-02 DIAGNOSIS — R42 Dizziness and giddiness: Secondary | ICD-10-CM

## 2023-03-02 DIAGNOSIS — B370 Candidal stomatitis: Secondary | ICD-10-CM

## 2023-03-02 MED ORDER — NYSTATIN 100000 UNIT/ML MT SUSP: 5.0000 mL | Freq: Every day | OROMUCOSAL | 3 refills | Status: DC

## 2023-03-02 MED ORDER — OMEPRAZOLE 40 MG PO CPDR: 40.0000 mg | DELAYED_RELEASE_CAPSULE | Freq: Every day | ORAL | 3 refills | Status: DC

## 2023-03-02 NOTE — Patient Instructions (Addendum)
We will have you stop taking meclizine and use this only as needed.  We will change the omeprazole to once a day instead of twice a day.

## 2023-03-02 NOTE — Progress Notes (Unsigned)
   Subjective:   Patient ID: [name], male    DOB: March 14, 1936, 87 y.o.   MRN: [id number]  HPI The patient is an 87 YO man coming in for concerns.  Review of Systems  Objective:  Physical Exam  Vitals:   03/02/23 0834 03/02/23 0836  BP: (!) 140/60 (!) 140/60  Pulse: (!) 58   Temp: 98.3 F (36.8 C)   TempSrc: Oral   SpO2: 95%   Weight: 199 lb (90.3 kg)   Height: 5\' 6"  (1.676 m)     Assessment & Plan:

## 2023-03-03 ENCOUNTER — Other Ambulatory Visit: Payer: Self-pay | Admitting: Internal Medicine

## 2023-03-03 NOTE — Telephone Encounter (Signed)
Product is on [employer]

## 2023-03-04 ENCOUNTER — Encounter: Payer: Self-pay | Admitting: Internal Medicine

## 2023-03-04 DIAGNOSIS — B370 Candidal stomatitis: Secondary | ICD-10-CM | POA: Insufficient documentation

## 2023-03-04 NOTE — Assessment & Plan Note (Signed)
Using spiriva daily and having burning in mouth with eating. Rx nystatin liquid to use daily for several weeks and then as needed if this returns. If this is more chronic use daily to prevent recurrence.

## 2023-03-04 NOTE — Assessment & Plan Note (Signed)
He does have chronic SOB and uses spiriva daily and albuterol inhaler prn. He has nebulizer but does not use this currently. Has in the past while recovering from significant covid-19 infection.

## 2023-03-04 NOTE — Assessment & Plan Note (Addendum)
BP at goal on amlodipine 10 mg daily and lasix 20 mg twice a week and losartan 100 mg daily. He has hydralazine to use prn only and may need to remove this from list as he does not think he is taking or has in some time they will check pill bottles at home.

## 2023-03-04 NOTE — Assessment & Plan Note (Signed)
Taking omeprazole 40 mg BID and has been for some time. Remote ulcer years ago. Will reduce to omeprazole 40 mg daily and he will report if any symptoms resume.

## 2023-03-04 NOTE — Assessment & Plan Note (Signed)
Uses tramadol #120 per month and this is controlling pain adequately. Checked [place] database and no inappropriate fills. Okay to continue. Have removed clonazepam from his list as he is no longer taking this. Advised we would not recommend to take benzos with tramadol.

## 2023-03-04 NOTE — Assessment & Plan Note (Signed)
Has been continuing meclizine regularly and does have dizziness. It is unclear to me if this could be related to chronic meclizine usage. Asked to stop meclizine and only use prn for moderate to severe dizziness. BP is normal and this does not sound to be orthostatic in nature.

## 2023-03-04 NOTE — Assessment & Plan Note (Signed)
Checking HgA1c yearly at least and not due today.

## 2023-03-04 NOTE — Assessment & Plan Note (Signed)
No current monitoring for new clinical symptoms could resume. Prior treatment.

## 2023-03-04 NOTE — Assessment & Plan Note (Signed)
Taking primidone with good success.

## 2023-03-04 NOTE — Assessment & Plan Note (Signed)
With a lot of chronic joint pain and arthritis related to this long term. Previous gold treatments many years ago. Is not on DMARD currently and no active flare or swollen joints today. Okay to monitor off DMARD if flare would be worthwhile.

## 2023-03-05 ENCOUNTER — Other Ambulatory Visit: Payer: Self-pay | Admitting: Internal Medicine

## 2023-03-06 ENCOUNTER — Other Ambulatory Visit: Payer: Self-pay | Admitting: Internal Medicine

## 2023-03-20 ENCOUNTER — Ambulatory Visit: Payer: Medicare Other | Admitting: Internal Medicine

## 2023-04-13 ENCOUNTER — Ambulatory Visit (INDEPENDENT_AMBULATORY_CARE_PROVIDER_SITE_OTHER): Payer: Medicare Other

## 2023-04-13 VITALS — BP 134/70 | HR 67 | Temp 97.6°F | Ht 66.0 in | Wt 201.0 lb

## 2023-04-13 DIAGNOSIS — Z0000 Encounter for general adult medical examination without abnormal findings: Secondary | ICD-10-CM | POA: Diagnosis not present

## 2023-04-13 NOTE — Patient Instructions (Signed)
It was great speaking with you today!  Please schedule your next Medicare Wellness Visit with your Nurse Health Advisor in 1 year by calling [phone]. 

## 2023-04-13 NOTE — Progress Notes (Signed)
Subjective:   [name] is a 87 y.o. male who presents for Medicare Annual/Subsequent preventive examination.  Visit Complete: In person  Patient Medicare AWV questionnaire was completed by the patient on N/A; I have confirmed that all information answered by patient is correct and no changes since this date.  Cardiac Risk Factors include: advanced age (>78men, >66 women);male gender;sedentary lifestyle;dyslipidemia;hypertension;obesity (BMI >30kg/m2)     Objective:    Today's Vitals   04/13/23 0905 04/13/23 1134  BP: 134/70   Pulse: 67   Temp: 97.6 F (36.4 C)   TempSrc: Temporal   SpO2: 95%   Weight: 201 lb (91.2 kg)   Height: 5\' 6"  (1.676 m)   PainSc:  7    Body mass index is 32.44 kg/m.     04/13/2023   11:42 AM 04/10/2022    1:05 PM 04/09/2021    5:51 PM 03/28/2021    9:06 AM 03/21/2021   10:14 AM 01/02/2020   12:33 PM 10/24/2019    9:53 AM  Advanced Directives  Does Patient Have a Medical Advance Directive? Yes Yes Yes No No Yes Yes  Type of Estate agent of [place];Living will Healthcare Power of [place];Living will Healthcare Power of [place];Living will      Does patient want to make changes to medical advance directive? No - Patient declined  No - Patient declined   No - Patient declined   Copy of Healthcare Power of Attorney in Chart? No - copy requested No - copy requested No - copy requested      Would patient like information on creating a medical advance directive?   No - Patient declined No - Patient declined No - Patient declined      Current Medications (verified) Outpatient Encounter Medications as of 04/13/2023  Medication Sig   acetaminophen (TYLENOL) 650 MG CR tablet Take 1,300 mg by mouth every 8 (eight) hours as needed for pain.   albuterol (PROVENTIL) (2.5 MG/3ML) 0.083% nebulizer solution Take 3 mLs (2.5 mg total) by nebulization every 6 (six) hours as needed for wheezing or shortness of breath.   albuterol (VENTOLIN  HFA) 108 (90 Base) MCG/ACT inhaler Inhale 2 puffs into the lungs every 6 (six) hours as needed for wheezing or shortness of breath.   amLODipine (NORVASC) 10 MG tablet TAKE 1 TABLET EVERY DAY   brimonidine (ALPHAGAN) 0.15 % ophthalmic solution Place 1 drop into both eyes 2 (two) times daily.   cetirizine (ZYRTEC) 10 MG tablet Take 10 mg by mouth daily.   clotrimazole (LOTRIMIN) 1 % cream Apply 1 application topically 2 (two) times daily as needed (FOR RASH). Prescribed for rash in groin   fluticasone (FLONASE) 50 MCG/ACT nasal spray USE 2 SPRAYS IN EACH NOSTRIL DAILY   furosemide (LASIX) 20 MG tablet TAKE 1 TABLET BY MOUTH TWICE A WEEK ON TUESDAY AND SATURDAY   gabapentin (NEURONTIN) 300 MG capsule TAKE 3-4 CAPSULES AT BEDTIME   hydrALAZINE (APRESOLINE) 10 MG tablet TAKE 1 TABLET THREE TIMES DAILY AS NEEDED FOR BLOOD PRESSURE GREATER THAN 150/90   hydroxypropyl methylcellulose / hypromellose (ISOPTO TEARS / GONIOVISC) 2.5 % ophthalmic solution Place 1 drop into both eyes 2 (two) times daily.   losartan (COZAAR) 100 MG tablet Take 1 tablet (100 mg total) by mouth daily.   meclizine (ANTIVERT) 25 MG tablet Take 1 tablet (25 mg total) by mouth 3 (three) times daily as needed for dizziness.   nitroGLYCERIN (NITROSTAT) 0.4 MG SL tablet PLACE 1 TABLET UNDER  THE TONGUE EVERY 5 MINUTES AS NEEDED FOR CHEST PAIN. MAX 3 DOSES. CALL 911 IF NO IMPROVEMENT AFTER 1ST DOSE   nystatin (MYCOSTATIN) 100000 UNIT/ML suspension TAKE 5 MLS (500,000 UNITS TOTAL) BY MOUTH AT BEDTIME.   omeprazole (PRILOSEC) 40 MG capsule Take 1 capsule (40 mg total) by mouth daily. TAKE 1 CAPSULE TWICE DAILY   ondansetron (ZOFRAN) 4 MG tablet    potassium chloride (KLOR-CON 10) 10 MEQ tablet Take 1 tablet (10 mEq total) by mouth daily.   primidone (MYSOLINE) 250 MG tablet TAKE 1 TABLET (250 MG TOTAL) BY MOUTH 4 (FOUR) TIMES DAILY. (Patient taking differently: Take 125 mg by mouth 3 (three) times daily.)   simvastatin (ZOCOR) 40 MG tablet  TAKE 1 AND 1/2 TABLETS AT BEDTIME   timolol (TIMOPTIC) 0.5 % ophthalmic solution Place 1 drop into both eyes 2 (two) times daily.    Tiotropium Bromide Monohydrate (SPIRIVA RESPIMAT) 2.5 MCG/ACT AERS Inhale 2.5 mcg into the lungs daily.   traMADol (ULTRAM) 50 MG tablet Take 1 tablet (50 mg total) by mouth every 6 (six) hours as needed.   Travoprost, BAK Free, (TRAVATAN Z) 0.004 % SOLN ophthalmic solution Place 1 drop into both eyes at bedtime.   triamcinolone (KENALOG) 0.025 % cream Apply 1 application topically 2 (two) times daily as needed (rash).    vitamin B-12 (CYANOCOBALAMIN) 500 MCG tablet Take 500 mcg by mouth daily.   [DISCONTINUED] Insulin Detemir (LEVEMIR) 100 UNIT/ML Pen Inject 5 Units into the skin 2 (two) times daily. (Patient not taking: Reported on 01/19/2020)   [DISCONTINUED] ipratropium-albuterol (DUONEB) 0.5-2.5 (3) MG/3ML SOLN Take 3 mLs by nebulization every 6 (six) hours as needed. (Patient not taking: Reported on 01/19/2020)   No facility-administered encounter medications on file as of 04/13/2023.    Allergies (verified) Morphine and codeine, Sulfa antibiotics, Hydrochlorothiazide, Tape, Hytrin [terazosin], Prednisone, Latex, Lisinopril, and Sulfur dioxide   History: Past Medical History:  Diagnosis Date   Acute bronchitis per pt no fever--  cough since discharge from hospital 07-02-2015   per pcp note 07-05-2015  mild to moderate bronchitis vs pna   Allergic rhinitis    Bladder cancer (HCC)    BPH (benign prostatic hyperplasia)    Bradycardia    CAD (coronary artery disease)    CHF (congestive heart failure) (HCC)    Chronic low back pain    COPD (chronic obstructive pulmonary disease) (HCC)    DDD (degenerative disc disease), lumbosacral    Depression    Diverticulosis of colon (without mention of hemorrhage)    Dyspnea    Essential tremor    Fatty liver disease, nonalcoholic    GERD (gastroesophageal reflux disease)    Glaucoma    History of bladder cancer     2004--  TCC low-grade , non-invasive   History of colon polyps    History of exercise stress test    02-05-2007-- ETT (Clinically positive, electrically equivocal, submaximal ETT,  appropriate bp response to exercise   History of hiatal hernia    History of kidney stones    History of peptic ulcer    HLD (hyperlipidemia)    HTN (hypertension)    Nocturia    Pneumonia    Productive cough    Rheumatoid arthritis(714.0)    Right renal artery stenosis (HCC)    per cath 03-14-2009 --- 40-50%   RLS (restless legs syndrome)    Stroke (HCC)    TIA   Vertigo, intermittent    Weak urinary stream  Wears dentures    upper   Wears glasses    Wears hearing aid    bilateral   Wears partial dentures    lower   Past Surgical History:  Procedure Laterality Date   CARDIAC CATHETERIZATION  02-12-2007  dr gregg taylor   Non-obstructive CAD,  20% pLCX,  20% LAD, ef 65%   CARDIAC CATHETERIZATION  03-14-2009  dr Eden Emms   pLAD and mid diaginal 20-30% multiple lesions,  OM1 20%, right renal ostial 40-50%,  ef 60%   CATARACT EXTRACTION W/ INTRAOCULAR LENS  IMPLANT, BILATERAL  07/14/2014   CYSTOSCOPY WITH BIOPSY N/A 07/11/2015   Procedure: CYSTOSCOPY WITH COLD CUP RESECTION AND FULGERATION;  Surgeon: Barron Alvine, MD;  Location[facility];  Service: Urology;  Laterality: N/A;   HIP ARTHROSCOPY W/ LABRAL DEBRIDEMENT Left 06/29/2000   and chondraplasty   JOINT REPLACEMENT     right big toe replacement   KYPHOPLASTY N/A 03/28/2021   Procedure: KYPHOPLASTY lumbar one;  Surgeon: Venita Lick, MD;  Location[facility];  Service: Orthopedics;  Laterality: N/A;   LUMBAR DISC SURGERY  07/16/2005   LUMBAR LAMINECTOMY/DECOMPRESSION MICRODISCECTOMY  07/09/2011   Procedure: LUMBAR LAMINECTOMY/DECOMPRESSION MICRODISCECTOMY;  Surgeon: Javier Docker;  Location: [facility];  Service: Orthopedics;  Laterality: Left;  Decompression L5 - S1 on the Left and repair of dura   LUMBAR  LAMINECTOMY/DECOMPRESSION MICRODISCECTOMY N/A 07/27/2013   Procedure: DECOMPRESSION L4 - L5 WITH EXCISION OF SYNOVIAL CYST AND, LATERAL MASS FUSION 1 LEVEL;  Surgeon: Javier Docker, MD;  Location: [facility];  Service: Orthopedics;  Laterality: N/A;   LUMBAR LAMINECTOMY/DECOMPRESSION MICRODISCECTOMY Left 07/06/2014   Procedure:  MICRO LUMBAR DECOMPRESSION L5-S1 ON LEFT, L4-5 REDO;  Surgeon: Javier Docker, MD;  Location: [facility];  Service: Orthopedics;  Laterality: Left;   SPINAL CORD STIMULATOR BATTERY EXCHANGE N/A 03/28/2021   Procedure: SPINAL CORD STIMULATOR BATTERY EXCHANGE/REPLACEMENT, L1 KYPHOPLASTY;  Surgeon: Venita Lick, MD;  Location: [facility] OR;  Service: Orthopedics;  Laterality: N/A;  90 MINS LOCAL WITH IV REG   SPINAL CORD STIMULATOR INSERTION N/A 09/11/2016   Procedure: Spinal cord stimulator placement;  Surgeon: Venita Lick, MD;  Location: [facility] OR;  Service: Orthopedics;  Laterality: N/A;   TONSILLECTOMY AND ADENOIDECTOMY     TRANSTHORACIC ECHOCARDIOGRAM  09/20/2010   normal LVF, ef 55-65%/  mild MR, TR and PR/  mild LAE   TRANSURETHRAL RESECTION OF BLADDER TUMOR  09/21/2002   Family History  Problem Relation Age of Onset   Heart attack Mother    Cancer Father        lung   Cancer Sister        lung   Diabetes Sister    Cancer Brother        lung that spread to brain   Social History   Socioeconomic History   Marital status: Widowed    Spouse name: Not on file   Number of children: 2   Years of education: Not on file   Highest education level: Not on file  Occupational History   Occupation: retired from [employer]    Employer: RETIRED  Tobacco Use   Smoking status: Former    Current packs/day: 0.00    Average packs/day: 2.0 packs/day for 20.0 years (40.0 ttl pk-yrs)    Types: Cigarettes    Start date: 10/08/1962    Quit date: 10/08/1982    Years since quitting: 40.5   Smokeless tobacco: Never  Vaping Use   Vaping status: Never  Used  Substance and Sexual  Activity   Alcohol use: Not Currently    Comment: rare wine or beer occassionally   Drug use: No   Sexual activity: Never  Other Topics Concern   Not on file  Social History Narrative   Not on file   Social Determinants of Health   Financial Resource Strain: Low Risk  (04/13/2023)   Overall Financial Resource Strain (CARDIA)    Difficulty of Paying Living Expenses: Not hard at all  Food Insecurity: No Food Insecurity (04/13/2023)   Hunger Vital Sign    Worried About Running Out of Food in the Last Year: Never true    Ran Out of Food in the Last Year: Never true  Transportation Needs: No Transportation Needs (04/13/2023)   PRAPARE - Administrator, Civil Service (Medical): No    Lack of Transportation (Non-Medical): No  Physical Activity: Inactive (04/13/2023)   Exercise Vital Sign    Days of Exercise per Week: 0 days    Minutes of Exercise per Session: 0 min  Stress: No Stress Concern Present (04/13/2023)   [employer] of Occupational Health - Occupational Stress Questionnaire    Feeling of Stress : Not at all  Social Connections: Moderately Isolated (04/13/2023)   Social Connection and Isolation Panel [NHANES]    Frequency of Communication with Friends and Family: More than three times a week    Frequency of Social Gatherings with Friends and Family: Once a week    Attends Religious Services: More than 4 times per year    Active Member of [employer] or Organizations: No    Attends Banker Meetings: Never    Marital Status: Widowed    Tobacco Counseling Counseling given: Not Answered   Clinical Intake:  Pre-visit preparation completed: Yes  Pain : 0-10 Pain Score: 7  Pain Type: Chronic pain Pain Location: Back Pain Orientation: Lower Pain Descriptors / Indicators: Constant, Aching Pain Onset: Other (comment) (years) Pain Frequency: Constant     BMI - recorded: 32 Nutritional Status: BMI > 30  Obese Nutritional Risks: None Diabetes:  No  How often do you need to have someone help you when you read instructions, pamphlets, or other written materials from your doctor or pharmacy?: 1 - Never What is the last grade level you completed in school?: 10th grade  Interpreter Needed?: No  Information entered by :: Elyse Jarvis, CMA   Activities of Daily Living    04/13/2023   11:42 AM  In your present state of health, do you have any difficulty performing the following activities:  Hearing? 1  Vision? 0  Difficulty concentrating or making decisions? 1  Dressing or bathing? 0  Doing errands, shopping? 0  Preparing Food and eating ? N  Using the Toilet? N  In the past six months, have you accidently leaked urine? N  Do you have problems with loss of bowel control? N  Managing your Medications? N  Managing your Finances? N  Housekeeping or managing your Housekeeping? N    Patient Care Team: Myrlene Broker, MD as PCP - General (Internal Medicine) Thomasene Ripple, DO as PCP - Cardiology (Cardiology) Mckinley Jewel, MD as Consulting Physician (Ophthalmology)  Indicate any recent Medical Services you may have received from other than [facility] providers in the past year (date may be approximate).     Assessment:   This is a routine wellness examination for [place].  Hearing/Vision screen Patient has hearing difficulty and wears hearing aids. Patient  does wear corrective lenses.   Goals Addressed             This Visit's Progress    Patient Stated       I would like to continue my current lifestyle.        Depression Screen    04/13/2023   11:41 AM 11/10/2022   10:08 AM 09/17/2022   10:51 AM 06/09/2022    1:05 PM 04/10/2022    1:10 PM 12/11/2021    2:36 PM 07/10/2021    3:30 PM  PHQ 2/9 Scores  PHQ - 2 Score 0 0 0 0 0 1 0  PHQ- 9 Score   0 0  3     Fall Risk    04/13/2023   11:42 AM 03/02/2023    8:34 AM 11/10/2022   10:06 AM 09/17/2022   10:52 AM 06/09/2022    1:06 PM  Fall Risk   Falls in the  past year? 1 1 1 1  0  Number falls in past yr: 1 1 0 0   Injury with Fall? 0 0 0 0 0  Risk for fall due to : History of fall(s);Impaired balance/gait  History of fall(s);Impaired balance/gait Impaired balance/gait Impaired balance/gait  Follow up Falls evaluation completed Falls evaluation completed Falls evaluation completed Falls evaluation completed;Education provided Falls evaluation completed    MEDICARE RISK AT HOME: Medicare Risk at Home Any stairs in or around the home?: No If so, are there any without handrails?: No Home free of loose throw rugs in walkways, pet beds, electrical cords, etc?: Yes Adequate lighting in your home to reduce risk of falls?: Yes Life alert?: Yes Use of a cane, walker or w/c?: Yes Grab bars in the bathroom?: Yes Shower chair or bench in shower?: Yes Elevated toilet seat or a handicapped toilet?: Yes  TIMED UP AND GO:  Was the test performed?  No    Cognitive Function:    12/14/2017    9:47 AM  MMSE - Mini Mental State Exam  Orientation to time 5  Orientation to Place 5  Registration 3  Attention/ Calculation 3  Recall 3  Language- name 2 objects 2  Language- repeat 1  Language- follow 3 step command 3  Language- read & follow direction 1  Write a sentence 1  Copy design 1  Total score 28        04/13/2023   11:44 AM 04/10/2022    1:28 PM  6CIT Screen  What Year? 0 points 0 points  What month? 0 points 0 points  What time? 0 points 0 points  Count back from 20 2 points 0 points  Months in reverse 2 points 0 points  Repeat phrase 0 points 0 points  Total Score 4 points 0 points    Immunizations Immunization History  Administered Date(s) Administered   Fluad Quad(high Dose 65+) 04/25/2020, 04/26/2021, 04/01/2022   Influenza Split 03/14/2012, 04/13/2017   Influenza Whole 05/17/2007, 04/19/2013   Influenza, High Dose Seasonal PF 04/13/2013, 04/13/2014, 04/13/2016, 04/14/2017, 04/20/2018, 04/01/2022, 04/03/2023    Influenza,inj,Quad PF,6+ Mos 04/05/2014, 06/04/2016   Influenza,inj,quad, With Preservative 04/12/2018, 04/13/2020   Influenza-Unspecified 04/14/2002, 04/26/2003, 05/10/2004, 06/04/2005, 06/05/2006, 06/01/2007, 04/13/2009, 04/13/2010, 07/15/2011, 04/13/2012, 05/14/2015, 04/14/2019, 04/13/2020   Moderna Covid-19 Fall Seasonal Vaccine 17yrs & older 07/30/2022   Moderna Covid-19 Vaccine Bivalent Booster 23yrs & up 04/26/2021   Moderna Sars-Covid-2 Vaccination 09/03/2019, 10/01/2019, 06/12/2020, 10/26/2020   Pneumococcal Conjugate-13 09/27/2013, 05/16/2014   Pneumococcal Polysaccharide-23 05/17/2007   Pneumococcal-Unspecified 06/13/2003, 06/04/2005  Respiratory Syncytial Virus Vaccine,Recomb Aduvanted(Arexvy) 09/02/2022   Td 09/02/2022   Tdap 11/28/2010, 10/20/2011, 01/02/2023   Unspecified SARS-COV-2 Vaccination 09/12/2019, 10/10/2019   Zoster Recombinant(Shingrix) 07/23/2017, 12/29/2017   Zoster, Live 10/25/2013    TDAP status: Up to date  Flu Vaccine status: Up to date  Pneumococcal vaccine status: Up to date  Covid-19 vaccine status: Completed vaccines  Qualifies for Shingles Vaccine? Yes   Zostavax completed No   Shingrix Completed?: Yes  Screening Tests Health Maintenance  Topic Date Due   COVID-19 Vaccine (9 - 2023-24 season) 04/29/2023 (Originally 03/15/2023)   Medicare Annual Wellness (AWV)  04/12/2024   DTaP/Tdap/Td (5 - Td or Tdap) 01/01/2033   Pneumonia Vaccine 64+ Years old  Completed   INFLUENZA VACCINE  Completed   Zoster Vaccines- Shingrix  Completed   HPV VACCINES  Aged Out    Health Maintenance  There are no preventive care reminders to display for this patient.   Colorectal cancer screening: No longer required.   Lung Cancer Screening: (Low Dose CT Chest recommended if Age 44-80 years, 20 pack-year currently smoking OR have quit w/in 15years.) does not qualify.   Lung Cancer Screening Referral: N/A  Additional Screening:  Hepatitis C Screening: does  not qualify; Completed N/A  Vision Screening: Recommended annual ophthalmology exams for early detection of glaucoma and other disorders of the eye. Is the patient up to date with their annual eye exam?  Yes  Who is the provider or what is the name of the office in which the patient attends annual eye exams? Dr. Marik[facility]  If pt is not established with a provider, would they like to be referred to a provider to establish care? No .   Dental Screening: Recommended annual dental exams for proper oral hygiene   Community Resource Referral / Chronic Care Management: CRR required this visit?  No   CCM required this visit?  No     Plan:     I have personally reviewed and noted the following in the patient's chart:   Medical and social history Use of alcohol, tobacco or illicit drugs  Current medications and supplements including opioid prescriptions. Patient is not currently taking opioid prescriptions. Functional ability and status Nutritional status Physical activity Advanced directives List of other physicians Hospitalizations, surgeries, and ER visits in previous 12 months Vitals Screenings to include cognitive, depression, and falls Referrals and appointments  In addition, I have reviewed and discussed with patient certain preventive protocols, quality metrics, and best practice recommendations. A written personalized care plan for preventive services as well as general preventive health recommendations were provided to patient.     Marinus Maw, CMA   04/13/2023   After Visit Summary: (In Person-Printed) AVS printed and given to the patient  Nurse Notes: N/A

## 2023-04-15 ENCOUNTER — Other Ambulatory Visit: Payer: Self-pay | Admitting: Internal Medicine

## 2023-04-16 ENCOUNTER — Other Ambulatory Visit: Payer: Self-pay

## 2023-04-23 ENCOUNTER — Telehealth: Payer: Self-pay | Admitting: Internal Medicine

## 2023-04-23 NOTE — Telephone Encounter (Signed)
Patient's son called and said they need to change patient's pharmacy to the [facility]. [facility] has not sent the patient his refills and he needs all refills/medications sent to the new pharmacy as soon as possible. The fax for [facility] is [phone].  Best callback [phone].

## 2023-04-27 ENCOUNTER — Other Ambulatory Visit: Payer: Self-pay

## 2023-04-27 MED ORDER — OMEPRAZOLE 40 MG PO CPDR: 40.0000 mg | DELAYED_RELEASE_CAPSULE | Freq: Every day | ORAL | 3 refills | Status: AC

## 2023-04-27 MED ORDER — NYSTATIN 100000 UNIT/ML MT SUSP: 5.0000 mL | Freq: Every day | OROMUCOSAL | 3 refills | Status: DC

## 2023-04-27 NOTE — Telephone Encounter (Signed)
I have sent in medication.

## 2023-05-04 ENCOUNTER — Telehealth: Payer: Self-pay | Admitting: Internal Medicine

## 2023-05-04 ENCOUNTER — Other Ambulatory Visit: Payer: Self-pay

## 2023-05-04 MED ORDER — PRIMIDONE 250 MG PO TABS: 250.0000 mg | ORAL_TABLET | Freq: Four times a day (QID) | ORAL | 3 refills | Status: DC

## 2023-05-04 NOTE — Telephone Encounter (Signed)
Prescription Request  05/04/2023  LOV: 03/02/2023  What is the name of the medication or equipment? primadone  Have you contacted your pharmacy to request a refill? Yes   Which pharmacy would you like this sent to?  [facility] - [place], [place] - [address] [place] [address] Phone: [phone] Fax: [phone]    Patient notified that their request is being sent to the clinical staff for review and that they should receive a response within 2 business days.   Please advise at Mobile There is no such number on file (mobile).

## 2023-05-05 ENCOUNTER — Other Ambulatory Visit: Payer: Self-pay

## 2023-05-05 ENCOUNTER — Telehealth: Payer: Self-pay | Admitting: Internal Medicine

## 2023-05-05 MED ORDER — GABAPENTIN 300 MG PO CAPS: ORAL_CAPSULE | ORAL | 1 refills | Status: DC

## 2023-05-05 MED ORDER — ONDANSETRON HCL 4 MG PO TABS: 4.0000 mg | ORAL_TABLET | Freq: Two times a day (BID) | ORAL | 0 refills | Status: DC

## 2023-05-05 MED ORDER — SIMVASTATIN 40 MG PO TABS: 40.0000 mg | ORAL_TABLET | Freq: Every day | ORAL | 3 refills | Status: DC

## 2023-05-05 NOTE — Telephone Encounter (Signed)
Prescription Request  05/05/2023  LOV: 03/02/2023  What is the name of the medication or equipment? gabapentin (NEURONTIN) 300 MG capsule  simvastatin (ZOCOR) 40 MG tablet  omeprazole (PRILOSEC) 40 MG capsule  Have you contacted your pharmacy to request a refill? No   Which pharmacy would you like this sent to?  [facility] - [place], [place] - [address] [place] [address] Phone: [phone] Fax: [phone]    Patient notified that their request is being sent to the clinical staff for review and that they should receive a response within 2 business days.   Please advise at Mobile There is no such number on file (mobile).

## 2023-05-07 ENCOUNTER — Other Ambulatory Visit: Payer: Self-pay

## 2023-05-07 ENCOUNTER — Other Ambulatory Visit: Payer: Self-pay | Admitting: Internal Medicine

## 2023-05-12 ENCOUNTER — Ambulatory Visit: Payer: Medicare Other | Admitting: Podiatry

## 2023-05-18 ENCOUNTER — Encounter: Payer: Self-pay | Admitting: Podiatry

## 2023-05-18 ENCOUNTER — Ambulatory Visit (INDEPENDENT_AMBULATORY_CARE_PROVIDER_SITE_OTHER): Payer: Medicare Other | Admitting: Podiatry

## 2023-05-18 VITALS — Ht 66.0 in | Wt 201.0 lb

## 2023-05-18 DIAGNOSIS — L600 Ingrowing nail: Secondary | ICD-10-CM

## 2023-05-18 NOTE — Patient Instructions (Signed)

## 2023-05-18 NOTE — Progress Notes (Signed)
Subjective:   Patient ID: [name], male   DOB: 87 y.o.   MRN: [id number]   HPI Patient presents with very painful second nail right that he is not able to take care of and would like to have it fixed permanently.  States he does get them trim but that is not been helpful   ROS      Objective:  Physical Exam  Scaler status intact good digital perfusion noted incurvation medial border right second nail sore when pressed     Assessment:  Ingrown toenail deformity second right medial border with pain     Plan:  H&P reviewed recommended correction of deformity explained procedure risk patient wants surgery understanding risk and today I went ahead and I had him sign consent form.  I infiltrated the right second toe 60 mg like Marcaine mixture sterile prep done using sterile instrumentation remove the medial border exposed matrix applied phenol 3 applications 30 seconds followed by alcohol lavage sterile dressing gave instructions on soaks and to wear dressing 24 hours take it off earlier if throbbing were to occur encouraged to call questions concerns which may arise during the healing process of several weeks

## 2023-05-28 ENCOUNTER — Encounter: Payer: Self-pay | Admitting: Cardiology

## 2023-05-28 ENCOUNTER — Ambulatory Visit: Payer: Medicare Other | Attending: Cardiology | Admitting: Cardiology

## 2023-05-28 VITALS — BP 132/70 | HR 57 | Ht 66.0 in | Wt 198.2 lb

## 2023-05-28 DIAGNOSIS — N183 Chronic kidney disease, stage 3 unspecified: Secondary | ICD-10-CM | POA: Diagnosis present

## 2023-05-28 DIAGNOSIS — E782 Mixed hyperlipidemia: Secondary | ICD-10-CM

## 2023-05-28 DIAGNOSIS — I10 Essential (primary) hypertension: Secondary | ICD-10-CM | POA: Diagnosis present

## 2023-05-28 DIAGNOSIS — I2510 Atherosclerotic heart disease of native coronary artery without angina pectoris: Secondary | ICD-10-CM | POA: Diagnosis present

## 2023-05-28 NOTE — Progress Notes (Addendum)
Cardiology Office Note:    Date:  05/28/2023   ID:  [name], DOB [birth year], MRN [id number]  PCP:  Myrlene Broker, MD  Cardiologist:  Thomasene Ripple, DO  Electrophysiologist:  None   Referring MD: Corwin Levins, MD   " I am doing well"  History of Present Illness:    [name] is a 87 y.o. male with a hx of nonobstructive CAD per records cardiac catheterization in 2010 hypertension, hyperlipidemia, history of COVID-19 infection and COPD presents today for follow-up visit.   Since his visit with me he had been doing well.  He transition to a new primary care provider.  No hospitalizations.  He also tells me that he has been getting shots from orthopedics and has been helping.  He is here today with his son.  Past Medical History:  Diagnosis Date   Acute bronchitis per pt no fever--  cough since discharge from hospital 07-02-2015   per pcp note 07-05-2015  mild to moderate bronchitis vs pna   Allergic rhinitis    Bladder cancer (HCC)    BPH (benign prostatic hyperplasia)    Bradycardia    CAD (coronary artery disease)    CHF (congestive heart failure) (HCC)    Chronic low back pain    COPD (chronic obstructive pulmonary disease) (HCC)    DDD (degenerative disc disease), lumbosacral    Depression    Diverticulosis of colon (without mention of hemorrhage)    Dyspnea    Essential tremor    Fatty liver disease, nonalcoholic    GERD (gastroesophageal reflux disease)    Glaucoma    History of bladder cancer    2004--  TCC low-grade , non-invasive   History of colon polyps    History of exercise stress test    02-05-2007-- ETT (Clinically positive, electrically equivocal, submaximal ETT,  appropriate bp response to exercise   History of hiatal hernia    History of kidney stones    History of peptic ulcer    HLD (hyperlipidemia)    HTN (hypertension)    Nocturia    Pneumonia    Productive cough    Rheumatoid arthritis(714.0)    Right renal artery stenosis (HCC)     per cath 03-14-2009 --- 40-50%   RLS (restless legs syndrome)    Stroke (HCC)    TIA   Vertigo, intermittent    Weak urinary stream    Wears dentures    upper   Wears glasses    Wears hearing aid    bilateral   Wears partial dentures    lower    Past Surgical History:  Procedure Laterality Date   CARDIAC CATHETERIZATION  02-12-2007  dr gregg taylor   Non-obstructive CAD,  20% pLCX,  20% LAD, ef 65%   CARDIAC CATHETERIZATION  03-14-2009  dr Eden Emms   pLAD and mid diaginal 20-30% multiple lesions,  OM1 20%, right renal ostial 40-50%,  ef 60%   CATARACT EXTRACTION W/ INTRAOCULAR LENS  IMPLANT, BILATERAL  07/14/2014   CYSTOSCOPY WITH BIOPSY N/A 07/11/2015   Procedure: CYSTOSCOPY WITH COLD CUP RESECTION AND FULGERATION;  Surgeon: Barron Alvine, MD;  Location: [facility];  Service: Urology;  Laterality: N/A;   HIP ARTHROSCOPY W/ LABRAL DEBRIDEMENT Left 06/29/2000   and chondraplasty   JOINT REPLACEMENT     right big toe replacement   KYPHOPLASTY N/A 03/28/2021   Procedure: KYPHOPLASTY lumbar one;  Surgeon: Venita Lick, MD;  Location: [facility] OR;  Service:  Orthopedics;  Laterality: N/A;   LUMBAR DISC SURGERY  07/16/2005   LUMBAR LAMINECTOMY/DECOMPRESSION MICRODISCECTOMY  07/09/2011   Procedure: LUMBAR LAMINECTOMY/DECOMPRESSION MICRODISCECTOMY;  Surgeon: Javier Docker;  Location: [facility];  Service: Orthopedics;  Laterality: Left;  Decompression L5 - S1 on the Left and repair of dura   LUMBAR LAMINECTOMY/DECOMPRESSION MICRODISCECTOMY N/A 07/27/2013   Procedure: DECOMPRESSION L4 - L5 WITH EXCISION OF SYNOVIAL CYST AND, LATERAL MASS FUSION 1 LEVEL;  Surgeon: Javier Docker, MD;  Location: [facility];  Service: Orthopedics;  Laterality: N/A;   LUMBAR LAMINECTOMY/DECOMPRESSION MICRODISCECTOMY Left 07/06/2014   Procedure:  MICRO LUMBAR DECOMPRESSION L5-S1 ON LEFT, L4-5 REDO;  Surgeon: Javier Docker, MD;  Location: [facility];  Service: Orthopedics;  Laterality: Left;   SPINAL CORD  STIMULATOR BATTERY EXCHANGE N/A 03/28/2021   Procedure: SPINAL CORD STIMULATOR BATTERY EXCHANGE/REPLACEMENT, L1 KYPHOPLASTY;  Surgeon: Venita Lick, MD;  Location: [facility] OR;  Service: Orthopedics;  Laterality: N/A;  90 MINS LOCAL WITH IV REG   SPINAL CORD STIMULATOR INSERTION N/A 09/11/2016   Procedure: Spinal cord stimulator placement;  Surgeon: Venita Lick, MD;  Location: [facility] OR;  Service: Orthopedics;  Laterality: N/A;   TONSILLECTOMY AND ADENOIDECTOMY     TRANSTHORACIC ECHOCARDIOGRAM  09/20/2010   normal LVF, ef 55-65%/  mild MR, TR and PR/  mild LAE   TRANSURETHRAL RESECTION OF BLADDER TUMOR  09/21/2002    Current Medications: Current Meds  Medication Sig   acetaminophen (TYLENOL) 650 MG CR tablet Take 1,300 mg by mouth every 8 (eight) hours as needed for pain.   albuterol (PROVENTIL) (2.5 MG/3ML) 0.083% nebulizer solution Take 3 mLs (2.5 mg total) by nebulization every 6 (six) hours as needed for wheezing or shortness of breath.   albuterol (VENTOLIN HFA) 108 (90 Base) MCG/ACT inhaler Inhale 2 puffs into the lungs every 6 (six) hours as needed for wheezing or shortness of breath.   amLODipine (NORVASC) 10 MG tablet TAKE 1 TABLET EVERY DAY   brimonidine (ALPHAGAN) 0.15 % ophthalmic solution Place 1 drop into both eyes 2 (two) times daily.   cetirizine (ZYRTEC) 10 MG tablet Take 10 mg by mouth daily.   clotrimazole (LOTRIMIN) 1 % cream Apply 1 application topically 2 (two) times daily as needed (FOR RASH). Prescribed for rash in groin   fluticasone (FLONASE) 50 MCG/ACT nasal spray USE 2 SPRAYS IN EACH NOSTRIL DAILY   furosemide (LASIX) 20 MG tablet TAKE 1 TABLET BY MOUTH TWICE A WEEK ON TUESDAY AND SATURDAY   gabapentin (NEURONTIN) 300 MG capsule TAKE 3-4 CAPSULES AT BEDTIME   hydrALAZINE (APRESOLINE) 10 MG tablet TAKE 1 TABLET THREE TIMES DAILY AS NEEDED FOR BLOOD PRESSURE GREATER THAN 150/90   hydroxypropyl methylcellulose / hypromellose (ISOPTO TEARS / GONIOVISC) 2.5 % ophthalmic  solution Place 1 drop into both eyes 2 (two) times daily.   losartan (COZAAR) 100 MG tablet TAKE 1 TABLET EVERY DAY   meclizine (ANTIVERT) 25 MG tablet TAKE 1 TABLET THREE TIMES DAILY AS NEEDED FOR DIZZINESS   nitroGLYCERIN (NITROSTAT) 0.4 MG SL tablet PLACE 1 TABLET UNDER THE TONGUE EVERY 5 MINUTES AS NEEDED FOR CHEST PAIN. MAX 3 DOSES. CALL 911 IF NO IMPROVEMENT AFTER 1ST DOSE   nystatin (MYCOSTATIN) 100000 UNIT/ML suspension Take 5 mLs (500,000 Units total) by mouth at bedtime.   omeprazole (PRILOSEC) 40 MG capsule Take 1 capsule (40 mg total) by mouth daily. TAKE 1 CAPSULE TWICE DAILY   ondansetron (ZOFRAN) 4 MG tablet Take 1 tablet (4 mg total) by mouth 2 (two)  times daily.   potassium chloride (KLOR-CON 10) 10 MEQ tablet Take 1 tablet (10 mEq total) by mouth daily.   primidone (MYSOLINE) 250 MG tablet Take 1 tablet (250 mg total) by mouth 4 (four) times daily.   simvastatin (ZOCOR) 40 MG tablet Take 1 tablet (40 mg total) by mouth daily at 6 PM.   timolol (TIMOPTIC) 0.5 % ophthalmic solution Place 1 drop into both eyes 2 (two) times daily.    Tiotropium Bromide Monohydrate (SPIRIVA RESPIMAT) 2.5 MCG/ACT AERS Inhale 2.5 mcg into the lungs daily.   traMADol (ULTRAM) 50 MG tablet Take 1 tablet (50 mg total) by mouth every 6 (six) hours as needed.   Travoprost, BAK Free, (TRAVATAN Z) 0.004 % SOLN ophthalmic solution Place 1 drop into both eyes at bedtime.   triamcinolone (KENALOG) 0.025 % cream Apply 1 application topically 2 (two) times daily as needed (rash).    vitamin B-12 (CYANOCOBALAMIN) 500 MCG tablet Take 500 mcg by mouth daily.     Allergies:   Morphine and codeine, Sulfa antibiotics, Hydrochlorothiazide, Tape, Hytrin [terazosin], Prednisone, Latex, Lisinopril, and Sulfur dioxide   Social History   Socioeconomic History   Marital status: Widowed    Spouse name: Not on file   Number of children: 2   Years of education: Not on file   Highest education level: Not on file   Occupational History   Occupation: retired from [employer]    Employer: RETIRED  Tobacco Use   Smoking status: Former    Current packs/day: 0.00    Average packs/day: 2.0 packs/day for 20.0 years (40.0 ttl pk-yrs)    Types: Cigarettes    Start date: 10/08/1962    Quit date: 10/08/1982    Years since quitting: 40.6   Smokeless tobacco: Never  Vaping Use   Vaping status: Never Used  Substance and Sexual Activity   Alcohol use: Not Currently    Comment: rare wine or beer occassionally   Drug use: No   Sexual activity: Never  Other Topics Concern   Not on file  Social History Narrative   Not on file   Social Determinants of Health   Financial Resource Strain: Low Risk  (04/13/2023)   Overall Financial Resource Strain (CARDIA)    Difficulty of Paying Living Expenses: Not hard at all  Food Insecurity: No Food Insecurity (04/13/2023)   Hunger Vital Sign    Worried About Running Out of Food in the Last Year: Never true    Ran Out of Food in the Last Year: Never true  Transportation Needs: No Transportation Needs (04/13/2023)   PRAPARE - Administrator, Civil Service (Medical): No    Lack of Transportation (Non-Medical): No  Physical Activity: Inactive (04/13/2023)   Exercise Vital Sign    Days of Exercise per Week: 0 days    Minutes of Exercise per Session: 0 min  Stress: No Stress Concern Present (04/13/2023)   [employer] of Occupational Health - Occupational Stress Questionnaire    Feeling of Stress : Not at all  Social Connections: Moderately Isolated (04/13/2023)   Social Connection and Isolation Panel [NHANES]    Frequency of Communication with Friends and Family: More than three times a week    Frequency of Social Gatherings with Friends and Family: Once a week    Attends Religious Services: More than 4 times per year    Active Member of [employer] or Organizations: No    Attends Banker Meetings: Never  Marital Status: Widowed     Family  History: The patient's family history includes Cancer in his brother, father, and sister; Diabetes in his sister; Heart attack in his mother.  ROS:   Review of Systems  Constitution: Negative for decreased appetite, fever and weight gain.  HENT: Negative for congestion, ear discharge, hoarse voice and sore throat.   Eyes: Negative for discharge, redness, vision loss in right eye and visual halos.  Cardiovascular: Negative for chest pain, dyspnea on exertion, leg swelling, orthopnea and palpitations.  Respiratory: Negative for cough, hemoptysis, shortness of breath and snoring.   Endocrine: Negative for heat intolerance and polyphagia.  Hematologic/Lymphatic: Negative for bleeding problem. Does not bruise/bleed easily.  Skin: Negative for flushing, nail changes, rash and suspicious lesions.  Musculoskeletal: Negative for arthritis, joint pain, muscle cramps, myalgias, neck pain and stiffness.  Gastrointestinal: Negative for abdominal pain, bowel incontinence, diarrhea and excessive appetite.  Genitourinary: Negative for decreased libido, genital sores and incomplete emptying.  Neurological: Negative for brief paralysis, focal weakness, headaches and loss of balance.  Psychiatric/Behavioral: Negative for altered mental status, depression and suicidal ideas.  Allergic/Immunologic: Negative for HIV exposure and persistent infections.    EKGs/Labs/Other Studies Reviewed:    The following studies were reviewed today:   EKG: Sinus bradycardia, heart rate 57 bpm compared to prior EKG no significant change heart rate slightly slower  Echo  IMPRESSIONS 11/17/19   1. Left ventricular ejection fraction, by estimation, is 60 to 65%. The left ventricle has normal function. The left ventricle has no regional wall motion abnormalities. Left ventricular diastolic parameters are consistent with Grade I diastolic dysfunction (impaired relaxation).   2. Right ventricular systolic function is normal. The  right ventricular size is normal. There is normal pulmonary artery systolic pressure.   3. Left atrial size was mild to moderately dilated.   4. The mitral valve is normal in structure. No evidence of mitral valve regurgitation. No evidence of mitral stenosis.   5. The aortic valve is normal in structure. Aortic valve regurgitation is mild. Mild aortic valve sclerosis is present, with no evidence of aortic valve stenosis.   6. There is mild dilatation of the ascending aorta measuring 40 mm.   7. The inferior vena cava is normal in size with greater than 50% respiratory variability, suggesting right atrial pressure of 3 mmHg.    Pharmacologic nuclear stress test Nuclear stress EF: 61%. There was no ST segment deviation noted during stress. The study is normal. This is a low risk study. The left ventricular ejection fraction is normal (55-65%).   Normal stress nuclear study with no ischemia or infarction.  Gated ejection fraction 61% with normal wall motion.    Recent Labs: 09/17/2022: ALT 12; BUN 9; Creatinine, Ser 0.98; Potassium 4.1; Sodium 139; TSH 3.95 11/10/2022: Hemoglobin 13.1; Platelets 218.0  Recent Lipid Panel    Component Value Date/Time   CHOL 175 09/17/2022 1153   TRIG 152.0 (H) 09/17/2022 1153   HDL 36.00 (L) 09/17/2022 1153   CHOLHDL 5 09/17/2022 1153   VLDL 30.4 09/17/2022 1153   LDLCALC 109 (H) 09/17/2022 1153   LDLDIRECT 86.0 06/24/2019 1204    Physical Exam:    VS:  BP 132/70 (BP Location: Right Arm, Patient Position: Sitting, Cuff Size: Normal)   Pulse (!) 57   Ht 5\' 6"  (1.676 m)   Wt 198 lb 3.2 oz (89.9 kg)   SpO2 93%   BMI 31.99 kg/m     Wt Readings from Last 3  Encounters:  05/28/23 198 lb 3.2 oz (89.9 kg)  05/18/23 201 lb (91.2 kg)  04/13/23 201 lb (91.2 kg)     GEN: Well nourished, well developed in no acute distress HEENT: Normal NECK: No JVD; No carotid bruits LYMPHATICS: No lymphadenopathy CARDIAC: S1S2 noted,RRR, no murmurs, rubs,  gallops RESPIRATORY:  Clear to auscultation without rales, wheezing or rhonchi  ABDOMEN: Soft, non-tender, non-distended, +bowel sounds, no guarding. EXTREMITIES: No edema, No cyanosis, no clubbing MUSCULOSKELETAL:  No deformity  SKIN: Warm and dry NEUROLOGIC:  Alert and oriented x 3, non-focal PSYCHIATRIC:  Normal affect, good insight  ASSESSMENT:    1. Essential hypertension   2. CAD in native artery   3. Stage 3 chronic kidney disease, unspecified whether stage 3a or 3b CKD (HCC)   4. Mixed hyperlipidemia     PLAN:     He appears to be doing well from a cardiovascular standpoint.  His blood pressure is appropriate.  No medication changes will be made today.  He has appointment with his PCP on Friday.  He has a whole set of lab  To be done so we will hold off on doing any blood work today he recently had blood work with his primary team at the [place].  The patient is in agreement with the above plan. The patient left the office in stable condition.  The patient will follow up in 1 year or sooner if needed.   Medication Adjustments/Labs and Tests Ordered: Current medicines are reviewed at length with the patient today.  Concerns regarding medicines are outlined above.  Orders Placed This Encounter  Procedures   EKG 12-Lead    No orders of the defined types were placed in this encounter.    Patient Instructions  Medication Instructions:  Your physician recommends that you continue on your current medications as directed. Please refer to the Current Medication list given to you today.  *If you need a refill on your cardiac medications before your next appointment, please call your pharmacy*    Follow-Up: At [facility], you and your health needs are our priority.  As part of our continuing mission to provide you with exceptional heart care, we have created designated Provider Care Teams.  These Care Teams include your primary Cardiologist (physician) and Advanced  Practice Providers (APPs -  Physician Assistants and Nurse Practitioners) who all work together to provide you with the care you need, when you need it.   Your next appointment:   1 year(s)  Provider:   Thomasene Ripple, DO      Adopting a Healthy Lifestyle.  Know what a healthy weight is for you (roughly BMI <25) and aim to maintain this   Aim for 7+ servings of fruits and vegetables daily   65-80+ fluid ounces of water or unsweet tea for healthy kidneys   Limit to max 1 drink of alcohol per day; avoid smoking/tobacco   Limit animal fats in diet for cholesterol and heart health - choose grass fed whenever available   Avoid highly processed foods, and foods high in saturated/trans fats   Aim for low stress - take time to unwind and care for your mental health   Aim for 150 min of moderate intensity exercise weekly for heart health, and weights twice weekly for bone health   Aim for 7-9 hours of sleep daily   When it comes to diets, agreement about the perfect plan isnt easy to find, even among the experts. Experts at the [facility]  [employer] developed an idea known as the Healthy Eating Plate. Just imagine a plate divided into logical, healthy portions.   The emphasis is on diet quality:   Load up on vegetables and fruits - one-half of your plate: Aim for color and variety, and remember that potatoes dont count.   Go for whole grains - one-quarter of your plate: Whole wheat, barley, wheat berries, quinoa, oats, brown rice, and foods made with them. If you want pasta, go with whole wheat pasta.   Protein power - one-quarter of your plate: Fish, chicken, beans, and nuts are all healthy, versatile protein sources. Limit red meat.   The diet, however, does go beyond the plate, offering a few other suggestions.   Use healthy plant oils, such as olive, canola, soy, corn, sunflower and peanut. Check the labels, and avoid partially hydrogenated oil, which have unhealthy  trans fats.   If youre thirsty, drink water. Coffee and tea are good in moderation, but skip sugary drinks and limit milk and dairy products to one or two daily servings.   The type of carbohydrate in the diet is more important than the amount. Some sources of carbohydrates, such as vegetables, fruits, whole grains, and beans-are healthier than others.   Finally, stay active  Signed, Thomasene Ripple, DO  05/28/2023 9:45 AM    [facility]

## 2023-05-28 NOTE — Patient Instructions (Signed)
 Medication Instructions:  Your physician recommends that you continue on your current medications as directed. Please refer to the Current Medication list given to you today.  *If you need a refill on your cardiac medications before your next appointment, please call your pharmacy*   Follow-Up: At [facility], you and your health needs are our priority.  As part of our continuing mission to provide you with exceptional heart care, we have created designated Provider Care Teams.  These Care Teams include your primary Cardiologist (physician) and Advanced Practice Providers (APPs -  Physician Assistants and Nurse Practitioners) who all work together to provide you with the care you need, when you need it.   Your next appointment:   1 year(s)  Provider:   Thomasene Ripple, DO

## 2023-06-01 ENCOUNTER — Ambulatory Visit (INDEPENDENT_AMBULATORY_CARE_PROVIDER_SITE_OTHER): Payer: Medicare Other | Admitting: Internal Medicine

## 2023-06-01 ENCOUNTER — Encounter: Payer: Self-pay | Admitting: Internal Medicine

## 2023-06-01 ENCOUNTER — Ambulatory Visit: Payer: Medicare Other | Admitting: Internal Medicine

## 2023-06-01 VITALS — BP 122/80 | HR 63 | Temp 97.7°F | Ht 66.0 in | Wt 190.0 lb

## 2023-06-01 DIAGNOSIS — R7303 Prediabetes: Secondary | ICD-10-CM | POA: Diagnosis not present

## 2023-06-01 DIAGNOSIS — R42 Dizziness and giddiness: Secondary | ICD-10-CM | POA: Diagnosis not present

## 2023-06-01 DIAGNOSIS — M545 Low back pain, unspecified: Secondary | ICD-10-CM | POA: Diagnosis not present

## 2023-06-01 DIAGNOSIS — I701 Atherosclerosis of renal artery: Secondary | ICD-10-CM

## 2023-06-01 DIAGNOSIS — G8929 Other chronic pain: Secondary | ICD-10-CM

## 2023-06-01 DIAGNOSIS — I509 Heart failure, unspecified: Secondary | ICD-10-CM | POA: Diagnosis not present

## 2023-06-01 DIAGNOSIS — N1831 Chronic kidney disease, stage 3a: Secondary | ICD-10-CM

## 2023-06-01 DIAGNOSIS — J449 Chronic obstructive pulmonary disease, unspecified: Secondary | ICD-10-CM | POA: Insufficient documentation

## 2023-06-01 DIAGNOSIS — F320 Major depressive disorder, single episode, mild: Secondary | ICD-10-CM | POA: Insufficient documentation

## 2023-06-01 NOTE — Progress Notes (Signed)
   Subjective:   Patient ID: [name], male    DOB: 02-Dec-1935, 87 y.o.   MRN: [id number]  HPI The patient is a 87 YO man coming in for medical management see A/ P.  Review of Systems  Constitutional: Negative.   HENT:  Positive for tinnitus.   Eyes: Negative.   Respiratory:  Positive for cough and shortness of breath. Negative for chest tightness.   Cardiovascular:  Negative for chest pain, palpitations and leg swelling.  Gastrointestinal:  Negative for abdominal distention, abdominal pain, constipation, diarrhea, nausea and vomiting.  Musculoskeletal:  Positive for arthralgias.  Skin: Negative.   Neurological: Negative.        Vertigo  Psychiatric/Behavioral: Negative.      Objective:  Physical Exam Constitutional:      Appearance: He is well-developed.  HENT:     Head: Normocephalic and atraumatic.  Cardiovascular:     Rate and Rhythm: Normal rate and regular rhythm.  Pulmonary:     Effort: Pulmonary effort is normal. No respiratory distress.     Breath sounds: Normal breath sounds. No wheezing or rales.  Abdominal:     General: Bowel sounds are normal. There is no distension.     Palpations: Abdomen is soft.     Tenderness: There is no abdominal tenderness. There is no rebound.  Musculoskeletal:     Cervical back: Normal range of motion.  Skin:    General: Skin is warm and dry.  Neurological:     Mental Status: He is alert and oriented to person, place, and time.     Coordination: Coordination abnormal.     Comments: Walker slow gait     Vitals:   06/01/23 0910  BP: 122/80  Pulse: 63  Temp: 97.7 F (36.5 C)  TempSrc: Oral  SpO2: 96%  Weight: 190 lb (86.2 kg)  Height: 5\' 6"  (1.676 m)    Assessment & Plan:

## 2023-06-01 NOTE — Assessment & Plan Note (Signed)
Stable and using inhalers with SOB on exertion. No flare today.

## 2023-06-01 NOTE — Assessment & Plan Note (Signed)
No flare today and is taking lasix 20 mg twice a week.

## 2023-06-01 NOTE — Assessment & Plan Note (Signed)
Off medication and stable. Chronic medical conditions do contribute to his mental health.

## 2023-06-01 NOTE — Assessment & Plan Note (Signed)
Taking simvastatin 40 mg daily and [facility] will follow. Continue statin.

## 2023-06-01 NOTE — Assessment & Plan Note (Signed)
Still struggling with this a lot and seeing ENT through [place] and they are unable to figure out or improve and chronic tinnitus.

## 2023-06-01 NOTE — Assessment & Plan Note (Signed)
Gets [facility] with [facility] and following those results. Stable in March 2024. BP at goal and no known DM.

## 2023-06-01 NOTE — Assessment & Plan Note (Signed)
Uses tramadol #120 per month and okay to continue. Can refill if needed.

## 2023-06-01 NOTE — Assessment & Plan Note (Signed)
Gets labs through [place] and seeing them next week. Overall stable and should have HgA1c 6-12 months.

## 2023-06-07 ENCOUNTER — Other Ambulatory Visit: Payer: Self-pay | Admitting: Internal Medicine

## 2023-07-10 ENCOUNTER — Telehealth: Payer: Self-pay | Admitting: Internal Medicine

## 2023-07-10 NOTE — Telephone Encounter (Signed)
Copied from CRM [id number]. Topic: Referral - Request for Referral >> Jul 10, 2023  1:47 PM Pascal Lux wrote: Did the patient discuss referral with their provider in the last year? No (If No - schedule appointment) (If Yes - send message)  Appointment offered? Yes  Type of order/referral and detailed reason for visit:  Patient son [place] called requesting a referral request due to his father having some memory issues. He's mixing his night and day meds. Took him to the hospital yesterday because he was weak and his pressure was a little low 79/34  -- [place] would like a call regarding referral request [phone].  Preference of office, provider, location: Dr. Arbutus Leas at [facility]  If referral order, have you been seen by this specialty before? No (If Yes, this issue or another issue? When? Where?  Can we respond through MyChart? Yes

## 2023-07-13 NOTE — Telephone Encounter (Signed)
Needs visit with PCP to address

## 2023-07-16 ENCOUNTER — Other Ambulatory Visit: Payer: Self-pay

## 2023-07-16 ENCOUNTER — Telehealth: Payer: Self-pay | Admitting: Cardiology

## 2023-07-16 MED ORDER — FUROSEMIDE 20 MG PO TABS: ORAL_TABLET | ORAL | 3 refills | Status: DC

## 2023-07-16 NOTE — Telephone Encounter (Signed)
 Sent Refill to [facility] in [place] because [place] denied the refill request

## 2023-07-16 NOTE — Telephone Encounter (Signed)
*  STAT* If patient is at the pharmacy, call can be transferred to refill team.   1. Which medications need to be refilled? (please list name of each medication and dose if known)  furosemide  (LASIX ) 20 MG tablet  2. Which pharmacy/location (including street and city if local pharmacy) is medication to be sent to? [facility] - [place], [place] - [address] Phone: [phone]  Fax: [phone]      3. Do they need a 30 day or 90 day supply? 90

## 2023-07-18 ENCOUNTER — Other Ambulatory Visit: Payer: Self-pay | Admitting: Internal Medicine

## 2023-07-20 ENCOUNTER — Other Ambulatory Visit: Payer: Self-pay

## 2023-07-21 ENCOUNTER — Encounter: Payer: Self-pay | Admitting: Internal Medicine

## 2023-07-21 ENCOUNTER — Ambulatory Visit (INDEPENDENT_AMBULATORY_CARE_PROVIDER_SITE_OTHER): Payer: Medicare Other | Admitting: Internal Medicine

## 2023-07-21 VITALS — BP 114/82 | HR 79 | Temp 97.8°F | Ht 66.0 in | Wt 185.0 lb

## 2023-07-21 DIAGNOSIS — I10 Essential (primary) hypertension: Secondary | ICD-10-CM

## 2023-07-21 DIAGNOSIS — R413 Other amnesia: Secondary | ICD-10-CM

## 2023-07-21 DIAGNOSIS — M069 Rheumatoid arthritis, unspecified: Secondary | ICD-10-CM

## 2023-07-21 DIAGNOSIS — N1831 Chronic kidney disease, stage 3a: Secondary | ICD-10-CM | POA: Diagnosis not present

## 2023-07-21 DIAGNOSIS — J449 Chronic obstructive pulmonary disease, unspecified: Secondary | ICD-10-CM | POA: Diagnosis not present

## 2023-07-21 DIAGNOSIS — I509 Heart failure, unspecified: Secondary | ICD-10-CM

## 2023-07-21 MED ORDER — AMOXICILLIN-POT CLAVULANATE 875-125 MG PO TABS: 1.0000 | ORAL_TABLET | Freq: Two times a day (BID) | ORAL | 0 refills | Status: AC

## 2023-07-21 NOTE — Patient Instructions (Signed)
 We have sent in augmentin to take 1 pill twice a day for 1 week.  We will have you stop the amlodipine for 1-2 weeks until he is eating better.  We will get you in with neurologist Dr. Arbutus Leas to check him out.

## 2023-07-21 NOTE — Progress Notes (Signed)
   Subjective:   Patient ID: [name], male    DOB: 03/09/1936, 88 y.o.   MRN: [id number]  HPI The patient is an 88 YO man coming in for ER follow up (in for weakness and dizziness, CT and CT angio head and neck normal with moderate stenosis 1 artery). No stroke and labs normal. He has poor appetite. The poor appetite and change in mentation has been going on about 2-3 weeks. Urine or CXR not done at ER visit he had recently.   Review of Systems  Constitutional:  Positive for activity change and appetite change.  HENT: Negative.    Eyes: Negative.   Respiratory:  Negative for cough, chest tightness and shortness of breath.   Cardiovascular:  Negative for chest pain, palpitations and leg swelling.  Gastrointestinal:  Negative for abdominal distention, abdominal pain, constipation, diarrhea, nausea and vomiting.  Musculoskeletal:  Positive for arthralgias.  Skin: Negative.   Neurological:  Positive for weakness.  Psychiatric/Behavioral:  Positive for confusion and sleep disturbance.     Objective:  Physical Exam Constitutional:      Appearance: He is well-developed.  HENT:     Head: Normocephalic and atraumatic.  Cardiovascular:     Rate and Rhythm: Normal rate and regular rhythm.  Pulmonary:     Effort: Pulmonary effort is normal. No respiratory distress.     Breath sounds: Normal breath sounds. No wheezing or rales.  Abdominal:     General: Bowel sounds are normal. There is no distension.     Palpations: Abdomen is soft.     Tenderness: There is no abdominal tenderness. There is no rebound.  Musculoskeletal:     Cervical back: Normal range of motion.  Skin:    General: Skin is warm and dry.  Neurological:     Mental Status: He is alert.     Coordination: Coordination abnormal.     Comments: Normally interactive and joking, barely adding to conversation during visit, does answer direct questions accurately most of the time, walker     Vitals:   07/21/23 1606  BP:  114/82  Pulse: 79  Temp: 97.8 F (36.6 C)  TempSrc: Oral  SpO2: 95%  Weight: 185 lb (83.9 kg)  Height: 5' 6 (1.676 m)    Assessment & Plan:  Visit time 25 minutes in face to face communication with patient and coordination of care, additional 5 minutes spent in record review, coordination or care, ordering tests, communicating/referring to other healthcare professionals, documenting in medical records all on the same day of the visit for total time 30 minutes spent on the visit.

## 2023-07-23 ENCOUNTER — Encounter: Payer: Self-pay | Admitting: Internal Medicine

## 2023-07-24 NOTE — Assessment & Plan Note (Signed)
 No flare recently but stable SOB. Given lack of localizing symptoms will treat for possible infection with augmentin given sudden change in clinical status.

## 2023-07-24 NOTE — Assessment & Plan Note (Signed)
 No flare today. BP running low due to poor appetite.

## 2023-07-24 NOTE — Assessment & Plan Note (Signed)
 BP running low and asked to hold losartan until eating well. Keep lasix twice a week and keep amlodipine 10 mg daily.

## 2023-07-24 NOTE — Assessment & Plan Note (Signed)
 Overall controlled and no recent flare and he denies change in pain.

## 2023-07-24 NOTE — Assessment & Plan Note (Signed)
 Recent labs stable.

## 2023-07-24 NOTE — Assessment & Plan Note (Signed)
 Has been going on for some time and worsening lately with poor appetite. There has been some atrophy noted on CT previously and moderate stenosis 1 artery on recent CT angio head/neck. They are unsure if he needs neurology referral. Given sudden change we are treating for possible UTI/COPD flare and if no clinical change they do desire neurology assessment.

## 2023-08-27 ENCOUNTER — Telehealth: Payer: Self-pay | Admitting: Internal Medicine

## 2023-08-27 DIAGNOSIS — R413 Other amnesia: Secondary | ICD-10-CM

## 2023-08-27 NOTE — Telephone Encounter (Signed)
Please send in referral for the patient. I do see that it was addressed in patient last OV.

## 2023-08-27 NOTE — Telephone Encounter (Signed)
 Copied from CRM [id number]. Topic: Referral - Status >> Aug 27, 2023 10:44 AM [place] J wrote: Reason for CRM: Patient's son ([place]) calling in to get an update on a referral that was supposed to be put in back in January to see Dr. Arbutus Leas (Neurologist). Patient would like to round back with Dr. Okey Dupre to see what happened to the referral and if they can get it sent in.

## 2023-08-28 NOTE — Telephone Encounter (Signed)
See last note they were to follow up with [place] if the treatment from Jan changed symptoms with memory at all. Please obtain this information

## 2023-09-02 ENCOUNTER — Ambulatory Visit: Payer: Medicare Other | Admitting: Podiatry

## 2023-09-03 ENCOUNTER — Encounter: Payer: Self-pay | Admitting: Physician Assistant

## 2023-09-03 NOTE — Telephone Encounter (Signed)
 Referral placed would they like to start medicine for memory in the meantime?

## 2023-09-03 NOTE — Telephone Encounter (Signed)
 Patient's son states that memory symptoms have been deteriorating for some time and the antibiotic helped the cough but not his mental status. Requesting neurology referral be placed today.  Copied from CRM [id number]. Topic: Referral - Question >> Sep 03, 2023  8:24 AM Irine Seal wrote: Reason for CRM: patients son [place] called to see why the neurologist referral has still not been sent in, he stated he wants to know who is dropping the ball. After looking in the chart, I relayed Dr. Frutoso Chase message that she is needing to know if the treatment from jan changed any of his memory issues at all, [place] stated there was no treatment plan, I informed him the Augmentin for possible UTI was the treatment, he stated it  did nothing, patients  memory issues have progressively gotten worse, [place] would like a call back, informed him of the 2 hour delay today.  [phone]

## 2023-09-11 ENCOUNTER — Ambulatory Visit (INDEPENDENT_AMBULATORY_CARE_PROVIDER_SITE_OTHER): Payer: Medicare Other | Admitting: Podiatry

## 2023-09-11 DIAGNOSIS — M79675 Pain in left toe(s): Secondary | ICD-10-CM

## 2023-09-11 DIAGNOSIS — B351 Tinea unguium: Secondary | ICD-10-CM | POA: Diagnosis not present

## 2023-09-11 DIAGNOSIS — M79674 Pain in right toe(s): Secondary | ICD-10-CM | POA: Diagnosis not present

## 2023-09-11 DIAGNOSIS — M19071 Primary osteoarthritis, right ankle and foot: Secondary | ICD-10-CM | POA: Diagnosis not present

## 2023-09-11 NOTE — Progress Notes (Signed)
 Subjective:  Patient ID: [name], male    DOB: 1935-10-01,  MRN: [id number]  SALBADOR [name] presents to clinic today for:  Chief Complaint  Patient presents with   [facility]    RFC today with no callous. His right great toe is very sensitive. Not diabetic and no anti coag.    Patient notes nails are thick, discolored, elongated and painful in shoegear when trying to ambulate.  Patient underwent a PNA procedure on the right second toe along the medial nail border.  He has no recurrence of nail and no pain at this time.  He underwent a joint replacement on the right great toe joint and does have some residual pain and stiffness in that joint.  PCP is Myrlene Broker, MD.  Past Medical History:  Diagnosis Date   Acute bronchitis per pt no fever--  cough since discharge from hospital 07-02-2015   per pcp note 07-05-2015  mild to moderate bronchitis vs pna   Allergic rhinitis    Bladder cancer (HCC)    BPH (benign prostatic hyperplasia)    Bradycardia    CAD (coronary artery disease)    CHF (congestive heart failure) (HCC)    Chronic low back pain    COPD (chronic obstructive pulmonary disease) (HCC)    DDD (degenerative disc disease), lumbosacral    Depression    Diverticulosis of colon (without mention of hemorrhage)    Dyspnea    Essential tremor    Fatty liver disease, nonalcoholic    GERD (gastroesophageal reflux disease)    Glaucoma    History of bladder cancer    2004--  TCC low-grade , non-invasive   History of colon polyps    History of exercise stress test    02-05-2007-- ETT (Clinically positive, electrically equivocal, submaximal ETT,  appropriate bp response to exercise   History of hiatal hernia    History of kidney stones    History of peptic ulcer    HLD (hyperlipidemia)    HTN (hypertension)    Nocturia    Pneumonia    Productive cough    Rheumatoid arthritis(714.0)    Right renal artery stenosis (HCC)    per cath 03-14-2009 --- 40-50%   RLS  (restless legs syndrome)    Stroke (HCC)    TIA   Vertigo, intermittent    Weak urinary stream    Wears dentures    upper   Wears glasses    Wears hearing aid    bilateral   Wears partial dentures    lower    Past Surgical History:  Procedure Laterality Date   CARDIAC CATHETERIZATION  02-12-2007  dr gregg taylor   Non-obstructive CAD,  20% pLCX,  20% LAD, ef 65%   CARDIAC CATHETERIZATION  03-14-2009  dr Eden Emms   pLAD and mid diaginal 20-30% multiple lesions,  OM1 20%, right renal ostial 40-50%,  ef 60%   CATARACT EXTRACTION W/ INTRAOCULAR LENS  IMPLANT, BILATERAL  07/14/2014   CYSTOSCOPY WITH BIOPSY N/A 07/11/2015   Procedure: CYSTOSCOPY WITH COLD CUP RESECTION AND FULGERATION;  Surgeon: Barron Alvine, MD;  Location[facility];  Service: Urology;  Laterality: N/A;   HIP ARTHROSCOPY W/ LABRAL DEBRIDEMENT Left 06/29/2000   and chondraplasty   JOINT REPLACEMENT     right big toe replacement   KYPHOPLASTY N/A 03/28/2021   Procedure: KYPHOPLASTY lumbar one;  Surgeon: Venita Lick, MD;  Location: [facility] OR;  Service: Orthopedics;  Laterality: N/A;  LUMBAR DISC SURGERY  07/16/2005   LUMBAR LAMINECTOMY/DECOMPRESSION MICRODISCECTOMY  07/09/2011   Procedure: LUMBAR LAMINECTOMY/DECOMPRESSION MICRODISCECTOMY;  Surgeon: Javier Docker;  Location: [facility];  Service: Orthopedics;  Laterality: Left;  Decompression L5 - S1 on the Left and repair of dura   LUMBAR LAMINECTOMY/DECOMPRESSION MICRODISCECTOMY N/A 07/27/2013   Procedure: DECOMPRESSION L4 - L5 WITH EXCISION OF SYNOVIAL CYST AND, LATERAL MASS FUSION 1 LEVEL;  Surgeon: Javier Docker, MD;  Location: [facility];  Service: Orthopedics;  Laterality: N/A;   LUMBAR LAMINECTOMY/DECOMPRESSION MICRODISCECTOMY Left 07/06/2014   Procedure:  MICRO LUMBAR DECOMPRESSION L5-S1 ON LEFT, L4-5 REDO;  Surgeon: Javier Docker, MD;  Location: [facility];  Service: Orthopedics;  Laterality: Left;   SPINAL CORD STIMULATOR BATTERY EXCHANGE N/A 03/28/2021    Procedure: SPINAL CORD STIMULATOR BATTERY EXCHANGE/REPLACEMENT, L1 KYPHOPLASTY;  Surgeon: Venita Lick, MD;  Location: [facility] OR;  Service: Orthopedics;  Laterality: N/A;  90 MINS LOCAL WITH IV REG   SPINAL CORD STIMULATOR INSERTION N/A 09/11/2016   Procedure: Spinal cord stimulator placement;  Surgeon: Venita Lick, MD;  Location: [facility] OR;  Service: Orthopedics;  Laterality: N/A;   TONSILLECTOMY AND ADENOIDECTOMY     TRANSTHORACIC ECHOCARDIOGRAM  09/20/2010   normal LVF, ef 55-65%/  mild MR, TR and PR/  mild LAE   TRANSURETHRAL RESECTION OF BLADDER TUMOR  09/21/2002    Allergies  Allergen Reactions   Morphine And Codeine Other (See Comments)    Migraine    Sulfa Antibiotics Rash and Itching    Patient  Is ok to use paper tape   Hydrochlorothiazide Other (See Comments)    REACTION: hyponatremia with daily use   Tape Itching    Patient  Is ok to use paper tape   Hytrin [Terazosin] Other (See Comments)    Dizziness   Prednisone Other (See Comments)    Caused problem with blood sugar   Latex Rash   Lisinopril Cough   Sulfur Dioxide Itching and Rash    Review of Systems: Negative except as noted in the HPI.  Objective:  JAIVEER [name] is a pleasant 88 y.o. male in NAD. AAO x 3.  Vascular Examination: Capillary refill time is 3-5 seconds to toes bilateral.  Trace palpable pedal pulses b/l LE. Digital hair sparse b/l.  Skin temperature gradient WNL b/l. No varicosities b/l. No cyanosis noted b/l.   Dermatological Examination: Pedal skin with decreased turgor, texture and tone b/l. No open wounds. No interdigital macerations b/l. Toenails x10 are 3mm thick, discolored, dystrophic with subungual debris. There is pain with compression of the nail plates.  They are elongated x10.  Neurological Examination: Protective sensation intact bilateral LE. Vibratory sensation intact bilateral LE.  Musculoskeletal Examination: Muscle strength 5/5 to all LE muscle groups b/l.  There is decreased  range of motion of the right first MPJ with some pain on palpation to the joint and with range of motion.  No crepitus is noted.  No erythema or calor is noted to this area.     Latest Ref Rng & Units 09/17/2022   11:53 AM  Hemoglobin A1C  Hemoglobin-A1c 4.6 - 6.5 % 6.0    Assessment/Plan: 1. Pain due to onychomycosis of toenails of both feet   2. Arthritis of first metatarsophalangeal (MTP) joint of right foot    The mycotic toenails were sharply debrided x10 with sterile nail nippers and a power debriding burr to decrease bulk/thickness and length.    Recommended the patient apply Voltaren gel to the first MPJ  on the right foot and massage in over 5 to 10-minute period at least twice daily for his residual pain in the joint after the joint replacement.  If he still has any pain or stiffness at this joint he may be a candidate for a custom orthotic with a Morton's extension.  Return in about 9 weeks (around 11/13/2023) for RFC.   Clerance Lav, DPM, FACFAS [facility]     [address] [place], [place] [address]                Office [phone]  Fax [phone]

## 2023-09-24 ENCOUNTER — Ambulatory Visit: Payer: Medicare Other | Admitting: Physician Assistant

## 2023-09-24 ENCOUNTER — Ambulatory Visit: Payer: Medicare Other

## 2023-09-26 ENCOUNTER — Other Ambulatory Visit: Payer: Self-pay | Admitting: Internal Medicine

## 2023-10-16 ENCOUNTER — Encounter: Payer: Self-pay | Admitting: Physician Assistant

## 2023-10-16 ENCOUNTER — Ambulatory Visit (INDEPENDENT_AMBULATORY_CARE_PROVIDER_SITE_OTHER): Payer: Self-pay | Admitting: Physician Assistant

## 2023-10-16 VITALS — BP 105/66 | HR 69 | Resp 18 | Ht 66.0 in | Wt 177.0 lb

## 2023-10-16 DIAGNOSIS — R413 Other amnesia: Secondary | ICD-10-CM

## 2023-10-16 MED ORDER — MEMANTINE HCL 5 MG PO TABS: ORAL_TABLET | ORAL | 11 refills | Status: DC

## 2023-10-16 NOTE — Patient Instructions (Addendum)
 It was a pleasure to see you today at our office.   Recommendations:    CT head , the radiology office will call you to arrange you appointment [phone] Check labs today  [address] Follow up in 2 months For psychiatric meds, mood meds: Please have your primary care physician manage these medications.  If you have any severe symptoms of a stroke, or other severe issues such as confusion,severe chills or fever, etc call 911 or go to the ER as you may need to be evaluated further   For guidance regarding [facility] and if placement were needed at the facility, contact Social Worker tel: [phone]   For assessment of decision of mental capacity and competency:  Call Dr. Erick Blinks, geriatric psychiatrist at [phone]  Counseling regarding caregiver distress, including caregiver depression, anxiety and issues regarding community resources, adult day care programs, adult living facilities, or memory care questions:  please contact your  Primary Doctor's Social Worker   Whom to call: Memory  decline, memory medications: Call our office [phone]    [email or web address]   RECOMMENDATIONS FOR ALL PATIENTS WITH MEMORY PROBLEMS: 1. Continue to exercise (Recommend 30 minutes of walking everyday, or 3 hours every week) 2. Increase social interactions - continue going to [place] and enjoy social gatherings with friends and family 3. Eat healthy, avoid fried foods and eat more fruits and vegetables 4. Maintain adequate blood pressure, blood sugar, and blood cholesterol level. Reducing the risk of stroke and cardiovascular disease also helps promoting better memory. 5. Avoid stressful situations. Live a simple life and avoid aggravations. Organize your time and prepare for the next day in anticipation. 6. Sleep well, avoid any interruptions of sleep and avoid any distractions in the bedroom that may interfere with  adequate sleep quality 7. Avoid sugar, avoid sweets as there is a strong link between excessive sugar intake, diabetes, and cognitive impairment We discussed the Mediterranean diet, which has been shown to help patients reduce the risk of progressive memory disorders and reduces cardiovascular risk. This includes eating fish, eat fruits and green leafy vegetables, nuts like almonds and hazelnuts, walnuts, and also use olive oil. Avoid fast foods and fried foods as much as possible. Avoid sweets and sugar as sugar use has been linked to worsening of memory function.  There is always a concern of gradual progression of memory problems. If this is the case, then we may need to adjust level of care according to patient needs. Support, both to the patient and caregiver, should then be put into place.         DRIVING: Regarding driving, in patients with progressive memory problems, driving will be impaired. We advise to have someone else do the driving if trouble finding directions or if minor accidents are reported. Independent driving assessment is available to determine safety of driving.   If you are interested in the driving assessment, you can contact the following:  [facility] [employer] [facility] in [place] [phone]  [facility] [phone]  [facility] [phone]  [facility] [phone] or [phone]   FALL PRECAUTIONS: Be cautious when walking. Scan the area for obstacles that may increase the risk of trips and falls. When getting up in the mornings, sit up at the edge of the bed for a few minutes before getting out of bed. Consider elevating the bed at the head end to avoid drop of blood pressure when getting up. Walk always in a well-lit room (use night lights  in the walls). Avoid area rugs or power cords from appliances in the middle of the walkways. Use a walker or a cane if necessary and consider physical therapy for balance exercise. Get  your eyesight checked regularly.  FINANCIAL OVERSIGHT: Supervision, especially oversight when making financial decisions or transactions is also recommended.  HOME SAFETY: Consider the safety of the kitchen when operating appliances like stoves, microwave oven, and blender. Consider having supervision and share cooking responsibilities until no longer able to participate in those. Accidents with firearms and other hazards in the house should be identified and addressed as well.   ABILITY TO BE LEFT ALONE: If patient is unable to contact 911 operator, consider using LifeLine, or when the need is there, arrange for someone to stay with patients. Smoking is a fire hazard, consider supervision or cessation. Risk of wandering should be assessed by caregiver and if detected at any point, supervision and safe proof recommendations should be instituted.  MEDICATION SUPERVISION: Inability to self-administer medication needs to be constantly addressed. Implement a mechanism to ensure safe administration of the medications.      Mediterranean Diet A Mediterranean diet refers to food and lifestyle choices that are based on the traditions of countries located on the [employer] [place]. This way of eating has been shown to help prevent certain conditions and improve outcomes for people who have chronic diseases, like kidney disease and heart disease. What are tips for following this plan? Lifestyle  Cook and eat meals together with your family, when possible. Drink enough fluid to keep your urine clear or pale yellow. Be physically active every day. This includes: Aerobic exercise like running or swimming. Leisure activities like gardening, walking, or housework. Get 7-8 hours of sleep each night. If recommended by your health care provider, drink red wine in moderation. This means 1 glass a day for nonpregnant women and 2 glasses a day for men. A glass of wine equals 5 oz (150 mL). Reading food labels   Check the serving size of packaged foods. For foods such as rice and pasta, the serving size refers to the amount of cooked product, not dry. Check the total fat in packaged foods. Avoid foods that have saturated fat or trans fats. Check the ingredients list for added sugars, such as corn syrup. Shopping  At the grocery store, buy most of your food from the areas near the walls of the store. This includes: Fresh fruits and vegetables (produce). Grains, beans, nuts, and seeds. Some of these may be available in unpackaged forms or large amounts (in bulk). Fresh seafood. Poultry and eggs. Low-fat dairy products. Buy whole ingredients instead of prepackaged foods. Buy fresh fruits and vegetables in-season from local farmers markets. Buy frozen fruits and vegetables in resealable bags. If you do not have access to quality fresh seafood, buy precooked frozen shrimp or canned fish, such as tuna, salmon, or sardines. Buy small amounts of raw or cooked vegetables, salads, or olives from the deli or salad bar at your store. Stock your pantry so you always have certain foods on hand, such as olive oil, canned tuna, canned tomatoes, rice, pasta, and beans. Cooking  Cook foods with extra-virgin olive oil instead of using butter or other vegetable oils. Have meat as a side dish, and have vegetables or grains as your main dish. This means having meat in small portions or adding small amounts of meat to foods like pasta or stew. Use beans or vegetables instead of meat in common dishes like  chili or lasagna. Experiment with different cooking methods. Try roasting or broiling vegetables instead of steaming or sauteing them. Add frozen vegetables to soups, stews, pasta, or rice. Add nuts or seeds for added healthy fat at each meal. You can add these to yogurt, salads, or vegetable dishes. Marinate fish or vegetables using olive oil, lemon juice, garlic, and fresh herbs. Meal planning  Plan to eat 1  vegetarian meal one day each week. Try to work up to 2 vegetarian meals, if possible. Eat seafood 2 or more times a week. Have healthy snacks readily available, such as: Vegetable sticks with hummus. Greek yogurt. Fruit and nut trail mix. Eat balanced meals throughout the week. This includes: Fruit: 2-3 servings a day Vegetables: 4-5 servings a day Low-fat dairy: 2 servings a day Fish, poultry, or lean meat: 1 serving a day Beans and legumes: 2 or more servings a week Nuts and seeds: 1-2 servings a day Whole grains: 6-8 servings a day Extra-virgin olive oil: 3-4 servings a day Limit red meat and sweets to only a few servings a month What are my food choices? Mediterranean diet Recommended Grains: Whole-grain pasta. Brown rice. Bulgar wheat. Polenta. Couscous. Whole-wheat bread. Orpah Cobb. Vegetables: Artichokes. Beets. Broccoli. Cabbage. Carrots. Eggplant. Green beans. Chard. Kale. Spinach. Onions. Leeks. Peas. Squash. Tomatoes. Peppers. Radishes. Fruits: Apples. Apricots. Avocado. Berries. Bananas. Cherries. Dates. Figs. Grapes. Lemons. Melon. Oranges. Peaches. Plums. Pomegranate. Meats and other protein foods: Beans. Almonds. Sunflower seeds. Pine nuts. Peanuts. Cod. Salmon. Scallops. Shrimp. Tuna. Tilapia. Clams. Oysters. Eggs. Dairy: Low-fat milk. Cheese. Greek yogurt. Beverages: Water. Red wine. Herbal tea. Fats and oils: Extra virgin olive oil. Avocado oil. Grape seed oil. Sweets and desserts: [place] yogurt with honey. Baked apples. Poached pears. Trail mix. Seasoning and other foods: Basil. Cilantro. Coriander. Cumin. Mint. Parsley. Sage. Rosemary. Tarragon. Garlic. Oregano. Thyme. Pepper. Balsalmic vinegar. Tahini. Hummus. Tomato sauce. Olives. Mushrooms. Limit these Grains: Prepackaged pasta or rice dishes. Prepackaged cereal with added sugar. Vegetables: Deep fried potatoes (french fries). Fruits: Fruit canned in syrup. Meats and other protein foods: Beef. Pork. Lamb.  Poultry with skin. Hot dogs. Tomasa Blase. Dairy: Ice cream. Sour cream. Whole milk. Beverages: Juice. Sugar-sweetened soft drinks. Beer. Liquor and spirits. Fats and oils: Butter. Canola oil. Vegetable oil. Beef fat (tallow). Lard. Sweets and desserts: Cookies. Cakes. Pies. Candy. Seasoning and other foods: Mayonnaise. Premade sauces and marinades. The items listed may not be a complete list. Talk with your dietitian about what dietary choices are right for you. Summary The Mediterranean diet includes both food and lifestyle choices. Eat a variety of fresh fruits and vegetables, beans, nuts, seeds, and whole grains. Limit the amount of red meat and sweets that you eat. Talk with your health care provider about whether it is safe for you to drink red wine in moderation. This means 1 glass a day for nonpregnant women and 2 glasses a day for men. A glass of wine equals 5 oz (150 mL). This information is not intended to replace advice given to you by your health care provider. Make sure you discuss any questions you have with your health care provider. Document Released: 02/21/2016 Document Revised: 03/25/2016 Document Reviewed: 02/21/2016 Elsevier Interactive Patient Education  2017 [employer].

## 2023-10-16 NOTE — Progress Notes (Addendum)
 Assessment/Plan:     [name] is a very pleasant 88 y.o. year old RH male with a history of hypertension, hyperlipidemia COPD, rheumatoid arthritis, CHF, remoteTIA, essential tremor of the right hand, BPH, CKD, carotid artery stenosis, documented history of bradycardia, HOH, prediabetes, seen today for evaluation of memory loss.  Unable to perform MoCA.  MMSE was 17/30. 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  Workup is in progress.    Memory Impairment  CT of the head without contrast to assess for underlying structural abnormality and assess vascular load (s/p  spinal stimulator) Check B12, TSH Start memantine 5 mg twice daily as directed, side effects discussed Recommend good control of cardiovascular risk factors.  Monitor possible orthostatic hypotension. Continue to control mood as per PCP Folllow up in 2 months  Subjective:    The patient is accompanied by his wife and son who supplements the history.    How long did patient have memory difficulties?  "For about more than 6 years, may be more little things"-son says.  His main difficulties with short-term memory although they may be instances in which his LTM may be affected.   repeats oneself?  Endorsed. Disoriented when walking into a room?  Patient denies    Leaving objects in unusual places?  Endorsed, including today, when he could not find his phone.  However, objects have not found in unusual places  Wandering behavior? Denies.  His son has placed a camera on the porch for extra security which is attached to the son's cell phone  Any personality changes, or depression, anxiety? Denies. "He  may some grouchy moments".   Hallucinations or paranoia? A couple of times he mentioned to his daughter that he was talking to his dead wife".  Seizures? Denies.    Any sleep changes?  Sleeps well "on and off". Takes  1THC gummie twice at night, given by his  pain medicine doctor with some relief.  This was given by his pain provider.  Denies vivid dreams, denies REM behavior or sleepwalking.  However in the past, he has been evaluated for sleep apnea and RLS, referred to sleep studies, as he denies having a formal diagnosis.   Any hygiene concerns?  Denies.   Independent of bathing and dressing? Endorsed  Does the patient need help with medications? Daughter in law is in charge, uses a pillbox.  It is not clear if the patient may take more or less medication than what is prescribed. Who is in charge of the finances? Son and his wife in charge     Any changes in appetite?  Decreased, "because he wakes up late ".   Patient have trouble swallowing?  Denies.   Does the patient cook? No  Any headaches?  Denies.   Chronic pain? Endorsed, has a stimulator, and receives back injections (Dr. Ethelene Hal) and he is on gabapentin.  He also has chronic knee pain, follows orthopedics Ambulates with difficulty? Denies  Needs a walker  to ambulate for stability.  Denies any shuffling.  Walks slowly due to knee pain. Recent falls or head injuries? Denies.     Vision changes?  Denies any new issues. Has a history of glaucoma Any strokelike symptoms? Denies.  She has a history of dizziness, for many years, but is not described as vertigo, "when I get up quickly, blood pressure changes ". Any tremors? R hand essential tremor followed at the [place] by  neurology, stable, he takes primidone.  Denies rest tremors. Any anosmia? Denies.   Any incontinence of urine? Has occasional recurrent UTI, history of bladder cancer, currently stable Any bowel dysfunction? Denies.      Patient lives with his son History of heavy alcohol intake? Denies.   History of heavy tobacco use? Denies.   Family history of dementia?  2  Sisters switch dementia  Does patient drive?Quit driving 5 years ago because "my reaction time was off".     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 with some reading limitations  Allergies  Allergen Reactions   Morphine And Codeine Other (See Comments)    Migraine    Sulfa Antibiotics Rash and Itching    Patient  Is ok to use paper tape   Hydrochlorothiazide Other (See Comments)    REACTION: hyponatremia with daily use   Tape Itching    Patient  Is ok to use paper tape   Hytrin [Terazosin] Other (See Comments)    Dizziness   Prednisone Other (See Comments)    Caused problem with blood sugar   Latex Rash   Lisinopril Cough   Sulfur Dioxide Itching and Rash    Current Outpatient Medications  Medication Instructions   acetaminophen (TYLENOL) 1,300 mg, Every 8 hours PRN   albuterol (VENTOLIN HFA) 108 (90 Base) MCG/ACT inhaler 2 puffs, Inhalation, Every 6 hours PRN   amLODipine (NORVASC) 10 mg, Oral, Daily   brimonidine (ALPHAGAN) 0.15 % ophthalmic solution 1 drop, 2 times daily   cetirizine (ZYRTEC) 10 mg, Daily   citalopram (CELEXA) 20 mg, Oral, Daily   clotrimazole (LOTRIMIN) 1 % cream 1 application , 2 times daily PRN   fluticasone (FLONASE) 50 MCG/ACT nasal spray USE 2 SPRAYS IN EACH NOSTRIL DAILY   furosemide (LASIX) 20 MG tablet TAKE 1 TABLET BY MOUTH TWICE A WEEK ON TUESDAY AND SATURDAY   gabapentin (NEURONTIN) 300 MG capsule TAKE 3-4 CAPSULES AT BEDTIME   hydrALAZINE (APRESOLINE) 10 MG tablet TAKE 1 TABLET THREE TIMES DAILY AS NEEDED FOR BLOOD PRESSURE GREATER THAN 150/90   hydroxypropyl methylcellulose / hypromellose (ISOPTO TEARS / GONIOVISC) 2.5 % ophthalmic solution 1 drop, 2 times daily   losartan (COZAAR) 100 mg, Oral, Daily   memantine (NAMENDA) 5 MG tablet Take 1 tablet (5 mg at night) for 2 weeks, then increase to 1 tablet (5 mg) twice a day   nitroGLYCERIN (NITROSTAT) 0.4 MG SL tablet PLACE 1 TABLET UNDER THE TONGUE EVERY 5 MINUTES AS NEEDED FOR CHEST PAIN. MAX 3 DOSES. CALL 911 IF NO IMPROVEMENT AFTER 1ST DOSE   nystatin  (MYCOSTATIN) 500,000 Units, Oral, Daily at bedtime   omeprazole (PRILOSEC) 40 mg, Oral, Daily, TAKE 1 CAPSULE TWICE DAILY   ondansetron (ZOFRAN) 4 mg, Oral, 2 times daily   potassium chloride (KLOR-CON 10) 10 MEQ tablet 10 mEq, Oral, Daily   primidone (MYSOLINE) 250 mg, Oral, 4 times daily   simvastatin (ZOCOR) 40 mg, Oral, Daily-1800   Spiriva Respimat 2.5 mcg, Daily   timolol (TIMOPTIC) 0.5 % ophthalmic solution 1 drop, 2 times daily   traMADol (ULTRAM) 50 mg, Oral, Every 6 hours PRN   Travoprost, BAK Free, (TRAVATAN Z) 0.004 % SOLN ophthalmic solution 1 drop, Both Eyes, Daily at bedtime   triamcinolone (KENALOG) 0.025 % cream 1 application , 2 times daily PRN     VITALS:   Vitals:   10/16/23  1316  BP: 105/66  Pulse: 69  Resp: 18  SpO2: 95%  Weight: 177 lb (80.3 kg)  Height: 5\' 6"  (1.676 m)      PHYSICAL EXAM   HEENT:  Normocephalic, atraumatic.  The superficial temporal arteries are without ropiness or tenderness. Cardiovascular: Regular rate and rhythm. Lungs: Clear to auscultation bilaterally. Neck: There are no carotid bruits noted bilaterally.  NEUROLOGICAL:     No data to display             10/16/2023    4:00 PM 12/14/2017    9:47 AM  MMSE - Mini Mental State Exam  Orientation to time 4 5  Orientation to Place 3 5  Registration 3 3  Attention/ Calculation 0 3  Recall 1 3  Language- name 2 objects 2 2  Language- repeat 1 1  Language- follow 3 step command 2 3  Language- read & follow direction 1 1  Write a sentence 0 1  Copy design 0 1  Total score 17 28     Orientation:  Alert and oriented to person, place and not to time. No aphasia or dysarthria. Fund of knowledge is appropriate. Recent and remote memory impaired.  Attention and concentration are reduced.  Able to name objects and repeat phrases  Delayed recall 0/3 Cranial nerves: There is good facial symmetry. Extraocular muscles are intact and visual fields are full to confrontational testing.  Speech is fluent and clear. No tongue deviation. Hearing is intact to conversational tone.  Tone: Tone is good throughout.  No cogwheeling Abnormal movements: He has known intention tremor on the right hand, without any other parkinsonian signs.  No asterixis or fasciculations Sensation: Sensation is intact to light touch.  Vibration is intact at the bilateral big toe.  Coordination: The patient has no difficulty with RAM's or FNF bilaterally. Normal finger to nose  Motor: Strength is 5/5 in the bilateral upper and lower extremities. There is no pronator drift. There are no fasciculations noted. DTR's: Deep tendon reflexes are 2/4 bilaterally. Gait and Station: The patient is able to ambulate with some difficulty due to knee pain, walks with a walker, slow pace. Gait is cautious and narrow. Stride length is short      Thank you for allowing [place] the opportunity to participate in the care of this nice patient. Please do not hesitate to contact us for any questions or concerns.   Total time spent on today's visit was 62 minutes dedicated to this patient today, preparing to see patient, examining the patient, ordering tests and/or medications and counseling the patient, documenting clinical information in the EHR or other health record, independently interpreting results and communicating results to the patient/family, discussing treatment and goals, answering patient's questions and coordinating care.  Cc:  Myrlene Broker, MD  Marlowe Kays 10/16/2023 4:19 PM

## 2023-10-17 LAB — VITAMIN B12: Vitamin B-12: 324 pg/mL (ref 200–1100)

## 2023-10-17 LAB — TSH: TSH: 2.04 m[IU]/L (ref 0.40–4.50)

## 2023-10-18 NOTE — Progress Notes (Signed)
 B12 on the lower side recommend, 1000 mcg daily, check levels with primary physician. Thyroid level normal

## 2023-10-22 ENCOUNTER — Ambulatory Visit
Admission: RE | Admit: 2023-10-22 | Discharge: 2023-10-22 | Disposition: A | Discharge status: Home / Self Care | Source: Ambulatory Visit | Attending: Physician Assistant | Admitting: Physician Assistant

## 2023-11-08 NOTE — Progress Notes (Signed)
 CT head with some age related changes in size and shape, atrophy, no acute findings, no acute stroke or masses or bleeding, thanks

## 2023-11-10 ENCOUNTER — Telehealth: Payer: Self-pay | Admitting: Physician Assistant

## 2023-11-10 NOTE — Telephone Encounter (Signed)
 Called and states that he was returning a call about test results and would like a call back

## 2023-11-10 NOTE — Progress Notes (Signed)
 Tried calling patient no answer. LMVOM to call the office back.

## 2023-11-10 NOTE — Progress Notes (Signed)
 Patient son advised that his mother was advised that they were worried about a mass based off conversations at last visit.   Son also wants to know if there is a large change in size since the last imaging five year ago.

## 2023-11-13 ENCOUNTER — Ambulatory Visit (INDEPENDENT_AMBULATORY_CARE_PROVIDER_SITE_OTHER): Payer: Medicare Other | Admitting: Podiatry

## 2023-11-13 DIAGNOSIS — M79674 Pain in right toe(s): Secondary | ICD-10-CM

## 2023-11-13 DIAGNOSIS — B351 Tinea unguium: Secondary | ICD-10-CM

## 2023-11-13 DIAGNOSIS — M79675 Pain in left toe(s): Secondary | ICD-10-CM

## 2023-11-13 NOTE — Progress Notes (Signed)
 Subjective:  Patient ID: [name], male    DOB: 05/31/1936,  MRN: [id number]   [name] presents to clinic today for:  Chief Complaint  Patient presents with   [facility]    RFC with out callous. Not diabetic and no anti coag.    Patient notes nails are thick, discolored, elongated and painful in shoegear when trying to ambulate.  He is using a walker for assistance today.  PCP is Adelia Homestead, MD.  Past Medical History:  Diagnosis Date   Acute bronchitis per pt no fever--  cough since discharge from hospital 07-02-2015   per pcp note 07-05-2015  mild to moderate bronchitis vs pna   Allergic rhinitis    Bladder cancer (HCC)    BPH (benign prostatic hyperplasia)    Bradycardia    CAD (coronary artery disease)    CHF (congestive heart failure) (HCC)    Chronic low back pain    COPD (chronic obstructive pulmonary disease) (HCC)    DDD (degenerative disc disease), lumbosacral    Depression    Diverticulosis of colon (without mention of hemorrhage)    Dyspnea    Essential tremor    Fatty liver disease, nonalcoholic    GERD (gastroesophageal reflux disease)    Glaucoma    History of bladder cancer    2004--  TCC low-grade , non-invasive   History of colon polyps    History of exercise stress test    02-05-2007-- ETT (Clinically positive, electrically equivocal, submaximal ETT,  appropriate bp response to exercise   History of hiatal hernia    History of kidney stones    History of peptic ulcer    HLD (hyperlipidemia)    HTN (hypertension)    Nocturia    Pneumonia    Productive cough    Rheumatoid arthritis(714.0)    Right renal artery stenosis (HCC)    per cath 03-14-2009 --- 40-50%   RLS (restless legs syndrome)    Stroke (HCC)    TIA   Vertigo, intermittent    Weak urinary stream    Wears dentures    upper   Wears glasses    Wears hearing aid    bilateral   Wears partial dentures    lower    Past Surgical History:  Procedure Laterality Date    CARDIAC CATHETERIZATION  02-12-2007  dr gregg taylor   Non-obstructive CAD,  20% pLCX,  20% LAD, ef 65%   CARDIAC CATHETERIZATION  03-14-2009  dr Stann Earnest   pLAD and mid diaginal 20-30% multiple lesions,  OM1 20%, right renal ostial 40-50%,  ef 60%   CATARACT EXTRACTION W/ INTRAOCULAR LENS  IMPLANT, BILATERAL  07/14/2014   CYSTOSCOPY WITH BIOPSY N/A 07/11/2015   Procedure: CYSTOSCOPY WITH COLD CUP RESECTION AND FULGERATION;  Surgeon: Ann Barnacle, MD;  Location: [facility];  Service: Urology;  Laterality: N/A;   HIP ARTHROSCOPY W/ LABRAL DEBRIDEMENT Left 06/29/2000   and chondraplasty   JOINT REPLACEMENT     right big toe replacement   KYPHOPLASTY N/A 03/28/2021   Procedure: KYPHOPLASTY lumbar one;  Surgeon: Mort Ards, MD;  Location: [facility];  Service: Orthopedics;  Laterality: N/A;   LUMBAR DISC SURGERY  07/16/2005   LUMBAR LAMINECTOMY/DECOMPRESSION MICRODISCECTOMY  07/09/2011   Procedure: LUMBAR LAMINECTOMY/DECOMPRESSION MICRODISCECTOMY;  Surgeon: Loel Ring;  Location: [facility];  Service: Orthopedics;  Laterality: Left;  Decompression L5 - S1 on the Left and repair of dura   LUMBAR  LAMINECTOMY/DECOMPRESSION MICRODISCECTOMY N/A 07/27/2013   Procedure: DECOMPRESSION L4 - L5 WITH EXCISION OF SYNOVIAL CYST AND, LATERAL MASS FUSION 1 LEVEL;  Surgeon: Loel Ring, MD;  Location: [facility];  Service: Orthopedics;  Laterality: N/A;   LUMBAR LAMINECTOMY/DECOMPRESSION MICRODISCECTOMY Left 07/06/2014   Procedure:  MICRO LUMBAR DECOMPRESSION L5-S1 ON LEFT, L4-5 REDO;  Surgeon: Loel Ring, MD;  Location: [facility];  Service: Orthopedics;  Laterality: Left;   SPINAL CORD STIMULATOR BATTERY EXCHANGE N/A 03/28/2021   Procedure: SPINAL CORD STIMULATOR BATTERY EXCHANGE/REPLACEMENT, L1 KYPHOPLASTY;  Surgeon: Mort Ards, MD;  Location: [facility];  Service: Orthopedics;  Laterality: N/A;  90 MINS LOCAL WITH IV REG   SPINAL CORD STIMULATOR INSERTION N/A 09/11/2016   Procedure: Spinal  cord stimulator placement;  Surgeon: Mort Ards, MD;  Location: [facility];  Service: Orthopedics;  Laterality: N/A;   TONSILLECTOMY AND ADENOIDECTOMY     TRANSTHORACIC ECHOCARDIOGRAM  09/20/2010   normal LVF, ef 55-65%/  mild MR, TR and PR/  mild LAE   TRANSURETHRAL RESECTION OF BLADDER TUMOR  09/21/2002    Allergies  Allergen Reactions   Morphine  And Codeine Other (See Comments)    Migraine    Sulfa Antibiotics Rash and Itching    Patient  Is ok to use paper tape   Hydrochlorothiazide  Other (See Comments)    REACTION: hyponatremia with daily use   Tape Itching    Patient  Is ok to use paper tape   Hytrin [Terazosin] Other (See Comments)    Dizziness   Prednisone  Other (See Comments)    Caused problem with blood sugar   Latex Rash   Lisinopril  Cough   Sulfur Dioxide Itching and Rash    Review of Systems: Negative except as noted in the HPI.  Objective:  [name] is a pleasant 88 y.o. male in NAD. AAO x 3.  Vascular Examination: Capillary refill time is 3-5 seconds to toes bilateral. Palpable pedal pulses b/l LE. Digital hair present b/l.  Skin temperature gradient WNL b/l. No varicosities b/l. No cyanosis noted b/l.   Dermatological Examination: Pedal skin with normal turgor, texture and tone b/l. No open wounds. No interdigital macerations b/l. Toenails x10 are 3mm thick, discolored, dystrophic with subungual debris. There is pain with compression of the nail plates.  They are elongated x10  Assessment/Plan: 1. Pain due to onychomycosis of toenails of both feet    The mycotic toenails were sharply debrided x10 with sterile nail nippers and a power debriding burr to decrease bulk/thickness and length.    Return in about 9 weeks (around 01/15/2024) for RFC.   Joe Murders, DPM, FACFAS [facility]     [address] [place], [place] [address]                Office [phone]  Fax [phone]

## 2023-11-30 ENCOUNTER — Encounter: Payer: Self-pay | Admitting: Internal Medicine

## 2023-11-30 ENCOUNTER — Ambulatory Visit: Payer: Medicare Other | Admitting: Internal Medicine

## 2023-11-30 ENCOUNTER — Ambulatory Visit: Payer: Self-pay | Admitting: Internal Medicine

## 2023-11-30 VITALS — BP 112/80 | HR 64 | Temp 97.7°F | Ht 66.0 in | Wt 176.0 lb

## 2023-11-30 DIAGNOSIS — N1831 Chronic kidney disease, stage 3a: Secondary | ICD-10-CM | POA: Diagnosis not present

## 2023-11-30 DIAGNOSIS — E782 Mixed hyperlipidemia: Secondary | ICD-10-CM

## 2023-11-30 DIAGNOSIS — R413 Other amnesia: Secondary | ICD-10-CM

## 2023-11-30 DIAGNOSIS — I509 Heart failure, unspecified: Secondary | ICD-10-CM

## 2023-11-30 DIAGNOSIS — I700 Atherosclerosis of aorta: Secondary | ICD-10-CM

## 2023-11-30 DIAGNOSIS — R7303 Prediabetes: Secondary | ICD-10-CM

## 2023-11-30 DIAGNOSIS — I10 Essential (primary) hypertension: Secondary | ICD-10-CM | POA: Diagnosis not present

## 2023-11-30 DIAGNOSIS — F320 Major depressive disorder, single episode, mild: Secondary | ICD-10-CM

## 2023-11-30 DIAGNOSIS — J8410 Pulmonary fibrosis, unspecified: Secondary | ICD-10-CM

## 2023-11-30 LAB — CBC
HCT: 41.4 % (ref 39.0–52.0)
Hemoglobin: 13.8 g/dL (ref 13.0–17.0)
MCHC: 33.3 g/dL (ref 30.0–36.0)
MCV: 84.1 fl (ref 78.0–100.0)
Platelets: 210 10*3/uL (ref 150.0–400.0)
RBC: 4.92 Mil/uL (ref 4.22–5.81)
RDW: 14.8 % (ref 11.5–15.5)
WBC: 6.2 10*3/uL (ref 4.0–10.5)

## 2023-11-30 LAB — COMPREHENSIVE METABOLIC PANEL WITH GFR
ALT: 11 U/L (ref 0–53)
AST: 18 U/L (ref 0–37)
Albumin: 4.3 g/dL (ref 3.5–5.2)
Alkaline Phosphatase: 80 U/L (ref 39–117)
BUN: 13 mg/dL (ref 6–23)
CO2: 29 meq/L (ref 19–32)
Calcium: 9.6 mg/dL (ref 8.4–10.5)
Chloride: 103 meq/L (ref 96–112)
Creatinine, Ser: 1.11 mg/dL (ref 0.40–1.50)
GFR: 60.19 mL/min (ref >60.00)
Glucose, Bld: 99 mg/dL (ref 70–99)
Potassium: 4.5 meq/L (ref 3.5–5.1)
Sodium: 139 meq/L (ref 135–145)
Total Bilirubin: 0.4 mg/dL (ref 0.2–1.2)
Total Protein: 6.8 g/dL (ref 6.0–8.3)

## 2023-11-30 LAB — LIPID PANEL
Cholesterol: 148 mg/dL (ref 0–200)
HDL: 34.9 mg/dL — ABNORMAL LOW (ref >39.00)
LDL Cholesterol: 84 mg/dL (ref 0–99)
NonHDL: 113.53
Total CHOL/HDL Ratio: 4
Triglycerides: 148 mg/dL (ref 0.0–149.0)
VLDL: 29.6 mg/dL (ref 0.0–40.0)

## 2023-11-30 LAB — HEMOGLOBIN A1C: Hgb A1c MFr Bld: 5.7 % (ref 4.6–6.5)

## 2023-11-30 NOTE — Patient Instructions (Signed)
 We will stop the amlodipine  and let us  know if the blood pressure starts going up.

## 2023-11-30 NOTE — Progress Notes (Signed)
   Subjective:   Patient ID: [name], male    DOB: 05/27/1936, 88 y.o.   MRN: [id number]  HPI The patient is an 88 YO man coming in for medical management (see A/P for details). BP running low and [facility] halved amlodipine  but BP still running low and lightheaded. This change was 2-3 weeks ago.   Review of Systems  Constitutional: Negative.   HENT: Negative.    Eyes: Negative.   Respiratory:  Negative for cough, chest tightness and shortness of breath.   Cardiovascular:  Negative for chest pain, palpitations and leg swelling.  Gastrointestinal:  Negative for abdominal distention, abdominal pain, constipation, diarrhea, nausea and vomiting.  Musculoskeletal: Negative.   Skin: Negative.   Neurological:  Positive for light-headedness.  Psychiatric/Behavioral: Negative.      Objective:  Physical Exam Constitutional:      Appearance: He is well-developed.  HENT:     Head: Normocephalic and atraumatic.  Cardiovascular:     Rate and Rhythm: Normal rate and regular rhythm.  Pulmonary:     Effort: Pulmonary effort is normal. No respiratory distress.     Breath sounds: Normal breath sounds. No wheezing or rales.  Abdominal:     General: Bowel sounds are normal. There is no distension.     Palpations: Abdomen is soft.     Tenderness: There is no abdominal tenderness. There is no rebound.  Musculoskeletal:     Cervical back: Normal range of motion.  Skin:    General: Skin is warm and dry.  Neurological:     Mental Status: He is alert and oriented to person, place, and time.     Coordination: Coordination abnormal.     Vitals:   11/30/23 0920  BP: 112/80  Pulse: 64  Temp: 97.7 F (36.5 C)  TempSrc: Oral  SpO2: 97%  Weight: 176 lb (79.8 kg)  Height: 5\' 6"  (1.676 m)    Assessment & Plan:

## 2023-12-02 NOTE — Assessment & Plan Note (Signed)
 Taking celexa  20 mg daily and controlled. Will continue.

## 2023-12-02 NOTE — Assessment & Plan Note (Signed)
 Taking namenda  and overall stable.

## 2023-12-02 NOTE — Assessment & Plan Note (Signed)
 Checking CMP for stability and BP slightly low today.

## 2023-12-02 NOTE — Assessment & Plan Note (Signed)
 BP is low and we will stop amlodipine . They will let us  know BP in 1-2 weeks and if high will resume amlodipine  at 2.5 mg daily. Keep losartan  100 mg daily and lasix  reduce to once a week. Checking CBC and CMP.

## 2023-12-02 NOTE — Assessment & Plan Note (Signed)
 No flare today and we will cut lasix  to once a week and if doing well can change to prn after that. He is having lightheadedness.

## 2023-12-02 NOTE — Assessment & Plan Note (Signed)
 Continue simvastatin  and checking lipid panel today.

## 2023-12-02 NOTE — Assessment & Plan Note (Signed)
 Using albuterol  prn and no signs clinically of progression he prefers to avoid imaging or diagnostics unless needed.

## 2023-12-02 NOTE — Assessment & Plan Note (Signed)
Checking lipid panel and adjust simvastatin as needed.  

## 2023-12-09 ENCOUNTER — Emergency Department (HOSPITAL_COMMUNITY)

## 2023-12-09 ENCOUNTER — Other Ambulatory Visit: Payer: Self-pay

## 2023-12-09 ENCOUNTER — Encounter (HOSPITAL_COMMUNITY): Payer: Self-pay | Admitting: Emergency Medicine

## 2023-12-09 ENCOUNTER — Observation Stay (HOSPITAL_COMMUNITY)
Admission: EM | Admit: 2023-12-09 | Discharge: 2023-12-10 | Disposition: A | Discharge status: Home / Self Care | Attending: Internal Medicine | Admitting: Internal Medicine

## 2023-12-09 DIAGNOSIS — R001 Bradycardia, unspecified: Secondary | ICD-10-CM | POA: Insufficient documentation

## 2023-12-09 DIAGNOSIS — N400 Enlarged prostate without lower urinary tract symptoms: Secondary | ICD-10-CM | POA: Diagnosis present

## 2023-12-09 DIAGNOSIS — Z8551 Personal history of malignant neoplasm of bladder: Secondary | ICD-10-CM

## 2023-12-09 DIAGNOSIS — R55 Syncope and collapse: Secondary | ICD-10-CM | POA: Diagnosis present

## 2023-12-09 DIAGNOSIS — I509 Heart failure, unspecified: Secondary | ICD-10-CM

## 2023-12-09 DIAGNOSIS — I2510 Atherosclerotic heart disease of native coronary artery without angina pectoris: Secondary | ICD-10-CM | POA: Insufficient documentation

## 2023-12-09 DIAGNOSIS — R251 Tremor, unspecified: Secondary | ICD-10-CM | POA: Diagnosis not present

## 2023-12-09 DIAGNOSIS — Z794 Long term (current) use of insulin: Secondary | ICD-10-CM | POA: Diagnosis not present

## 2023-12-09 DIAGNOSIS — R0602 Shortness of breath: Secondary | ICD-10-CM | POA: Insufficient documentation

## 2023-12-09 DIAGNOSIS — G8929 Other chronic pain: Secondary | ICD-10-CM | POA: Insufficient documentation

## 2023-12-09 DIAGNOSIS — E669 Obesity, unspecified: Secondary | ICD-10-CM | POA: Diagnosis present

## 2023-12-09 DIAGNOSIS — G4700 Insomnia, unspecified: Secondary | ICD-10-CM | POA: Diagnosis not present

## 2023-12-09 DIAGNOSIS — Z6830 Body mass index (BMI) 30.0-30.9, adult: Secondary | ICD-10-CM | POA: Diagnosis not present

## 2023-12-09 DIAGNOSIS — I5032 Chronic diastolic (congestive) heart failure: Secondary | ICD-10-CM | POA: Insufficient documentation

## 2023-12-09 DIAGNOSIS — I9589 Other hypotension: Secondary | ICD-10-CM | POA: Diagnosis not present

## 2023-12-09 DIAGNOSIS — E66811 Obesity, class 1: Secondary | ICD-10-CM | POA: Diagnosis not present

## 2023-12-09 DIAGNOSIS — Z8546 Personal history of malignant neoplasm of prostate: Secondary | ICD-10-CM | POA: Diagnosis not present

## 2023-12-09 DIAGNOSIS — I130 Hypertensive heart and chronic kidney disease with heart failure and stage 1 through stage 4 chronic kidney disease, or unspecified chronic kidney disease: Secondary | ICD-10-CM | POA: Insufficient documentation

## 2023-12-09 DIAGNOSIS — J8410 Pulmonary fibrosis, unspecified: Secondary | ICD-10-CM | POA: Diagnosis present

## 2023-12-09 DIAGNOSIS — I110 Hypertensive heart disease with heart failure: Secondary | ICD-10-CM | POA: Diagnosis not present

## 2023-12-09 DIAGNOSIS — Z79899 Other long term (current) drug therapy: Secondary | ICD-10-CM | POA: Insufficient documentation

## 2023-12-09 DIAGNOSIS — I959 Hypotension, unspecified: Secondary | ICD-10-CM

## 2023-12-09 DIAGNOSIS — J449 Chronic obstructive pulmonary disease, unspecified: Secondary | ICD-10-CM | POA: Diagnosis not present

## 2023-12-09 DIAGNOSIS — I10 Essential (primary) hypertension: Secondary | ICD-10-CM | POA: Diagnosis present

## 2023-12-09 HISTORY — DX: Unspecified convulsions: R56.9

## 2023-12-09 HISTORY — DX: Atherosclerotic heart disease of native coronary artery without angina pectoris: I25.10

## 2023-12-09 LAB — CBC WITH DIFFERENTIAL/PLATELET
Abs Immature Granulocytes: 0.03 10*3/uL (ref 0.00–0.07)
Basophils Absolute: 0 10*3/uL (ref 0.0–0.1)
Basophils Relative: 0 %
Eosinophils Absolute: 0.1 10*3/uL (ref 0.0–0.5)
Eosinophils Relative: 1 %
HCT: 39.4 % (ref 39.0–52.0)
Hemoglobin: 13.2 g/dL (ref 13.0–17.0)
Immature Granulocytes: 0 %
Lymphocytes Relative: 12 %
Lymphs Abs: 1.1 10*3/uL (ref 0.7–4.0)
MCH: 28.6 pg (ref 26.0–34.0)
MCHC: 33.5 g/dL (ref 30.0–36.0)
MCV: 85.5 fL (ref 80.0–100.0)
Monocytes Absolute: 0.7 10*3/uL (ref 0.1–1.0)
Monocytes Relative: 8 %
Neutro Abs: 6.8 10*3/uL (ref 1.7–7.7)
Neutrophils Relative %: 79 %
Platelets: 201 10*3/uL (ref 150–400)
RBC: 4.61 MIL/uL (ref 4.22–5.81)
RDW: 13.4 % (ref 11.5–15.5)
WBC: 8.7 10*3/uL (ref 4.0–10.5)
nRBC: 0 % (ref 0.0–0.2)

## 2023-12-09 LAB — COMPREHENSIVE METABOLIC PANEL WITH GFR
ALT: 14 U/L (ref 0–44)
AST: 22 U/L (ref 15–41)
Albumin: 3.9 g/dL (ref 3.5–5.0)
Alkaline Phosphatase: 71 U/L (ref 38–126)
Anion gap: 13 (ref 5–15)
BUN: 11 mg/dL (ref 8–23)
CO2: 20 mmol/L — ABNORMAL LOW (ref 22–32)
Calcium: 9.5 mg/dL (ref 8.9–10.3)
Chloride: 106 mmol/L (ref 98–111)
Creatinine, Ser: 1.09 mg/dL (ref 0.61–1.24)
GFR, Estimated: >60 mL/min (ref >60)
Glucose, Bld: 113 mg/dL — ABNORMAL HIGH (ref 70–99)
Potassium: 3.8 mmol/L (ref 3.5–5.1)
Sodium: 139 mmol/L (ref 135–145)
Total Bilirubin: 0.7 mg/dL (ref 0.0–1.2)
Total Protein: 6.3 g/dL — ABNORMAL LOW (ref 6.5–8.1)

## 2023-12-09 LAB — D-DIMER, QUANTITATIVE: D-Dimer, Quant: <0.27 ug{FEU}/mL (ref 0.00–0.50)

## 2023-12-09 LAB — RESP PANEL BY RT-PCR (RSV, FLU A&B, COVID)  RVPGX2
Influenza A by PCR: NEGATIVE
Influenza B by PCR: NEGATIVE
Resp Syncytial Virus by PCR: NEGATIVE
SARS Coronavirus 2 by RT PCR: NEGATIVE

## 2023-12-09 LAB — TROPONIN I (HIGH SENSITIVITY)
Troponin I (High Sensitivity): 7 ng/L (ref <18)
Troponin I (High Sensitivity): 6 ng/L (ref <18)

## 2023-12-09 LAB — BRAIN NATRIURETIC PEPTIDE: B Natriuretic Peptide: 101.5 pg/mL — ABNORMAL HIGH (ref 0.0–100.0)

## 2023-12-09 LAB — CK: Total CK: 57 U/L (ref 49–397)

## 2023-12-09 MED ORDER — UMECLIDINIUM BROMIDE 62.5 MCG/ACT IN AEPB
1.0000 | INHALATION_SPRAY | Freq: Every day | RESPIRATORY_TRACT | Status: DC
  Administered 2023-12-10: 1 via RESPIRATORY_TRACT
  Filled 2023-12-09: qty 7

## 2023-12-09 MED ORDER — SIMVASTATIN 20 MG PO TABS: 40.0000 mg | ORAL_TABLET | Freq: Every day | ORAL | Status: DC

## 2023-12-09 MED ORDER — SODIUM CHLORIDE 0.9% FLUSH: 3.0000 mL | INTRAVENOUS | Status: DC | PRN

## 2023-12-09 MED ORDER — SODIUM CHLORIDE 0.9% FLUSH
3.0000 mL | Freq: Two times a day (BID) | INTRAVENOUS | Status: DC
  Administered 2023-12-09 – 2023-12-10 (×2): 3 mL via INTRAVENOUS

## 2023-12-09 MED ORDER — TIOTROPIUM BROMIDE MONOHYDRATE 2.5 MCG/ACT IN AERS: 2.5000 ug | INHALATION_SPRAY | Freq: Every day | RESPIRATORY_TRACT | Status: DC

## 2023-12-09 MED ORDER — TRAMADOL HCL 50 MG PO TABS: 50.0000 mg | ORAL_TABLET | Freq: Four times a day (QID) | ORAL | Status: DC | PRN

## 2023-12-09 MED ORDER — PRIMIDONE 250 MG PO TABS
250.0000 mg | ORAL_TABLET | Freq: Four times a day (QID) | ORAL | Status: DC
  Administered 2023-12-09 – 2023-12-10 (×2): 250 mg via ORAL
  Filled 2023-12-09 (×7): qty 1

## 2023-12-09 MED ORDER — TAMSULOSIN HCL 0.4 MG PO CAPS: 0.8000 mg | ORAL_CAPSULE | Freq: Every day | ORAL | Status: DC

## 2023-12-09 MED ORDER — SODIUM CHLORIDE 0.9 % IV SOLN: 250.0000 mL | INTRAVENOUS | Status: DC | PRN

## 2023-12-09 MED ORDER — POLYVINYL ALCOHOL 1.4 % OP SOLN
1.0000 [drp] | Freq: Every morning | OPHTHALMIC | Status: DC
  Administered 2023-12-10: 1 [drp] via OPHTHALMIC
  Filled 2023-12-09: qty 15

## 2023-12-09 MED ORDER — LATANOPROST 0.005 % OP SOLN
1.0000 [drp] | Freq: Every day | OPHTHALMIC | Status: DC
  Administered 2023-12-09: 1 [drp] via OPHTHALMIC
  Filled 2023-12-09: qty 2.5

## 2023-12-09 MED ORDER — TIMOLOL MALEATE 0.5 % OP SOLN
1.0000 [drp] | Freq: Two times a day (BID) | OPHTHALMIC | Status: DC
  Administered 2023-12-09 – 2023-12-10 (×2): 1 [drp] via OPHTHALMIC
  Filled 2023-12-09: qty 5

## 2023-12-09 MED ORDER — ARTIFICIAL TEARS OPHTHALMIC OINT
TOPICAL_OINTMENT | Freq: Every evening | OPHTHALMIC | Status: DC | PRN
  Filled 2023-12-09: qty 3.5

## 2023-12-09 MED ORDER — CITALOPRAM HYDROBROMIDE 20 MG PO TABS
20.0000 mg | ORAL_TABLET | Freq: Every day | ORAL | Status: DC
  Administered 2023-12-10: 20 mg via ORAL
  Filled 2023-12-09: qty 1

## 2023-12-09 MED ORDER — GABAPENTIN 300 MG PO CAPS
300.0000 mg | ORAL_CAPSULE | Freq: Every day | ORAL | Status: DC
  Administered 2023-12-09: 300 mg via ORAL
  Filled 2023-12-09: qty 1

## 2023-12-09 MED ORDER — ENOXAPARIN SODIUM 40 MG/0.4ML IJ SOSY
40.0000 mg | PREFILLED_SYRINGE | INTRAMUSCULAR | Status: DC
  Administered 2023-12-10: 40 mg via SUBCUTANEOUS
  Filled 2023-12-09: qty 0.4

## 2023-12-09 MED ORDER — TRAZODONE HCL 50 MG PO TABS
50.0000 mg | ORAL_TABLET | Freq: Every day | ORAL | Status: DC
  Administered 2023-12-09: 50 mg via ORAL
  Filled 2023-12-09: qty 1

## 2023-12-09 NOTE — ED Notes (Signed)
 Patient transported to X-ray

## 2023-12-09 NOTE — ED Notes (Signed)
Help get patient into a gown on the monitor patient is resting with call bell in reach

## 2023-12-09 NOTE — ED Notes (Signed)
 CCMD is monitoring pt

## 2023-12-09 NOTE — H&P (Addendum)
 History and Physical    [name] IRC:[id number] DOB: [birth year] DOA: 12/09/2023  PCP: Adelia Homestead, MD  Patient coming from: home   Chief Complaint: hypotension, near syncope  HPI: [name] is a 88 y.o. male with medical history significant for non-obstructive cad, htn, bladder cancer, copd, chronic pain, presents with the above.  Recently started on namenda  by neurology for concern for developing alzheimers. Also recently had discontinuation of amlodipine  for low bps. Has had several weeks of intermittent lightheadedness, worse with standing. Today while seated developed severe lightheadedness and diaphoresis. No chest pain or palpitations. Bp was systolic 50s at home, ems alerted, remained low when they arrived. No dysuria no vomiting/diarrhea. No other med changes.   Review of Systems: As per HPI otherwise 10 point review of systems negative.    Past Medical History:  Diagnosis Date   Acute bronchitis per pt no fever--  cough since discharge from hospital 07-02-2015   per pcp note 07-05-2015  mild to moderate bronchitis vs pna   Allergic rhinitis    Bladder cancer (HCC)    BPH (benign prostatic hyperplasia)    Bradycardia    CAD (coronary artery disease)    CHF (congestive heart failure) (HCC)    Chronic low back pain    COPD (chronic obstructive pulmonary disease) (HCC)    DDD (degenerative disc disease), lumbosacral    Depression    Diverticulosis of colon (without mention of hemorrhage)    Dyspnea    Essential tremor    Fatty liver disease, nonalcoholic    GERD (gastroesophageal reflux disease)    Glaucoma    History of bladder cancer    2004--  TCC low-grade , non-invasive   History of colon polyps    History of exercise stress test    02-05-2007-- ETT (Clinically positive, electrically equivocal, submaximal ETT,  appropriate bp response to exercise   History of hiatal hernia    History of kidney stones    History of peptic ulcer    HLD  (hyperlipidemia)    HTN (hypertension)    Nocturia    Pneumonia    Productive cough    Rheumatoid arthritis(714.0)    Right renal artery stenosis (HCC)    per cath 03-14-2009 --- 40-50%   RLS (restless legs syndrome)    Seizures (HCC)    Stroke (HCC)    TIA   Vertigo, intermittent    Weak urinary stream    Wears dentures    upper   Wears glasses    Wears hearing aid    bilateral   Wears partial dentures    lower    Past Surgical History:  Procedure Laterality Date   CARDIAC CATHETERIZATION  02-12-2007  dr gregg taylor   Non-obstructive CAD,  20% pLCX,  20% LAD, ef 65%   CARDIAC CATHETERIZATION  03-14-2009  dr Stann Earnest   pLAD and mid diaginal 20-30% multiple lesions,  OM1 20%, right renal ostial 40-50%,  ef 60%   CATARACT EXTRACTION W/ INTRAOCULAR LENS  IMPLANT, BILATERAL  07/14/2014   CYSTOSCOPY WITH BIOPSY N/A 07/11/2015   Procedure: CYSTOSCOPY WITH COLD CUP RESECTION AND FULGERATION;  Surgeon: Ann Barnacle, MD;  [facility];  Service: Urology;  Laterality: N/A;   HIP ARTHROSCOPY W/ LABRAL DEBRIDEMENT Left 06/29/2000   and chondraplasty   JOINT REPLACEMENT     right big toe replacement   KYPHOPLASTY N/A 03/28/2021   Procedure: KYPHOPLASTY lumbar one;  Surgeon: Mort Ards, MD;  Location:  [facility];  Service: Orthopedics;  Laterality: N/A;   LUMBAR DISC SURGERY  07/16/2005   LUMBAR LAMINECTOMY/DECOMPRESSION MICRODISCECTOMY  07/09/2011   Procedure: LUMBAR LAMINECTOMY/DECOMPRESSION MICRODISCECTOMY;  Surgeon: Loel Ring;  Location: [facility];  Service: Orthopedics;  Laterality: Left;  Decompression L5 - S1 on the Left and repair of dura   LUMBAR LAMINECTOMY/DECOMPRESSION MICRODISCECTOMY N/A 07/27/2013   Procedure: DECOMPRESSION L4 - L5 WITH EXCISION OF SYNOVIAL CYST AND, LATERAL MASS FUSION 1 LEVEL;  Surgeon: Loel Ring, MD;  Location: [facility];  Service: Orthopedics;  Laterality: N/A;   LUMBAR LAMINECTOMY/DECOMPRESSION MICRODISCECTOMY Left 07/06/2014    Procedure:  MICRO LUMBAR DECOMPRESSION L5-S1 ON LEFT, L4-5 REDO;  Surgeon: Loel Ring, MD;  Location: [facility];  Service: Orthopedics;  Laterality: Left;   SPINAL CORD STIMULATOR BATTERY EXCHANGE N/A 03/28/2021   Procedure: SPINAL CORD STIMULATOR BATTERY EXCHANGE/REPLACEMENT, L1 KYPHOPLASTY;  Surgeon: Mort Ards, MD;  Location: [facility];  Service: Orthopedics;  Laterality: N/A;  90 MINS LOCAL WITH IV REG   SPINAL CORD STIMULATOR INSERTION N/A 09/11/2016   Procedure: Spinal cord stimulator placement;  Surgeon: Mort Ards, MD;  Location: [facility];  Service: Orthopedics;  Laterality: N/A;   TONSILLECTOMY AND ADENOIDECTOMY     TRANSTHORACIC ECHOCARDIOGRAM  09/20/2010   normal LVF, ef 55-65%/  mild MR, TR and PR/  mild LAE   TRANSURETHRAL RESECTION OF BLADDER TUMOR  09/21/2002     reports that he quit smoking about 41 years ago. His smoking use included cigarettes. He started smoking about 61 years ago. He has a 40 pack-year smoking history. He has never used smokeless tobacco. He reports that he does not currently use alcohol . He reports that he does not use drugs.  Allergies  Allergen Reactions   Morphine  And Codeine Other (See Comments)    Migraine    Sulfa Antibiotics Itching and Rash   Hydrochlorothiazide  Other (See Comments)    hyponatremia with daily use   Tape Itching and Other (See Comments)    Patient  Is ok to use paper tape   Hytrin [Terazosin] Other (See Comments)    Dizziness   Latex Rash   Lisinopril  Cough    Family History  Problem Relation Age of Onset   Heart attack Mother    Cancer Father        lung   Cancer Sister        lung   Diabetes Sister    Cancer Brother        lung that spread to brain    Prior to Admission medications   Medication Sig Start Date End Date Taking? Authorizing Provider  acetaminophen  (TYLENOL ) 650 MG CR tablet Take 1,300 mg by mouth every 8 (eight) hours as needed for pain.    [provider]  albuterol  (VENTOLIN  HFA)  108 (90 Base) MCG/ACT inhaler Inhale 2 puffs into the lungs every 6 (six) hours as needed for wheezing or shortness of breath. 10/31/19   Roslyn Coombe, MD  amLODipine  (NORVASC ) 10 MG tablet TAKE 1 TABLET BY MOUTH EVERY DAY Patient not taking: Reported on 12/09/2023 07/20/23   Adelia Homestead, MD  amLODipine  (NORVASC ) 5 MG tablet Take 1 tablet by mouth daily. 11/12/23   [provider]  ammonium lactate (LAC-HYDRIN) 12 % lotion Apply 1 Application topically as needed for dry skin. 11/12/23   [provider]  brimonidine  (ALPHAGAN ) 0.15 % ophthalmic solution Place 1 drop into both eyes 2 (two) times daily.    [provider]  cetirizine  (ZYRTEC ) 10 MG tablet Take 10 mg by mouth daily.    [provider]  citalopram  (CELEXA ) 20 MG tablet TAKE 1 TABLET EVERY DAY 09/28/23   Adelia Homestead, MD  clotrimazole  (LOTRIMIN ) 1 % cream Apply 1 application topically 2 (two) times daily as needed (FOR RASH). Prescribed for rash in groin    [provider]  fluticasone  (FLONASE ) 50 MCG/ACT nasal spray USE 2 SPRAYS IN EACH NOSTRIL DAILY Patient taking differently: Place 2 sprays into both nostrils daily. 11/02/20   Roslyn Coombe, MD  furosemide  (LASIX ) 20 MG tablet TAKE 1 TABLET BY MOUTH TWICE A WEEK ON TUESDAY AND SATURDAY Patient taking differently: Take 20 mg by mouth 2 (two) times a week. TAKE 1 TABLET BY MOUTH TWICE A WEEK ON TUESDAY AND SATURDAY 07/16/23   Tobb, Kardie, DO  gabapentin  (NEURONTIN ) 300 MG capsule TAKE 3-4 CAPSULES AT BEDTIME 05/05/23   Adelia Homestead, MD  hydrALAZINE  (APRESOLINE ) 10 MG tablet TAKE 1 TABLET THREE TIMES DAILY AS NEEDED FOR BLOOD PRESSURE GREATER THAN 150/90 06/09/22   Roslyn Coombe, MD  hydroxypropyl methylcellulose / hypromellose (ISOPTO TEARS / GONIOVISC) 2.5 % ophthalmic solution Place 1 drop into both eyes 2 (two) times daily.    [provider]  losartan  (COZAAR ) 100 MG tablet TAKE 1 TABLET EVERY DAY 04/16/23   Roslyn Coombe, MD  meclizine  (ANTIVERT ) 25 MG tablet Take 25 mg by mouth 3 (three) times daily as needed. 09/28/23   [provider]  memantine  (NAMENDA ) 5 MG tablet Take 1 tablet (5 mg at night) for 2 weeks, then increase to 1 tablet (5 mg) twice a day 10/16/23   Wertman, Sara E, PA-C  nitroGLYCERIN  (NITROSTAT ) 0.4 MG SL tablet PLACE 1 TABLET UNDER THE TONGUE EVERY 5 MINUTES AS NEEDED FOR CHEST PAIN. MAX 3 DOSES. CALL 911 IF NO IMPROVEMENT AFTER 1ST DOSE Patient taking differently: Place 0.4 mg under the tongue every 5 (five) minutes as needed for chest pain. 02/01/18   Roslyn Coombe, MD  nystatin  (MYCOSTATIN ) 100000 UNIT/ML suspension Take 5 mLs (500,000 Units total) by mouth at bedtime. 04/27/23   Adelia Homestead, MD  omeprazole  (PRILOSEC) 40 MG capsule Take 1 capsule (40 mg total) by mouth daily. TAKE 1 CAPSULE TWICE DAILY Patient taking differently: Take 40 mg by mouth daily. TAKE 2 CAPSULE TWICE DAILY 04/27/23   Adelia Homestead, MD  ondansetron  (ZOFRAN ) 4 MG tablet Take 1 tablet (4 mg total) by mouth 2 (two) times daily. 05/05/23   Adelia Homestead, MD  potassium chloride  (KLOR-CON  10) 10 MEQ tablet Take 1 tablet (10 mEq total) by mouth daily. 02/28/22   Roslyn Coombe, MD  primidone  (MYSOLINE ) 250 MG tablet Take 1 tablet (250 mg total) by mouth 4 (four) times daily. Patient taking differently: Take 250 mg by mouth. 1 and 1/2 tab po qd 05/04/23   Adelia Homestead, MD  simvastatin  (ZOCOR ) 40 MG tablet Take 1 tablet (40 mg total) by mouth daily at 6 PM. 05/05/23   Adelia Homestead, MD  tamsulosin  (FLOMAX ) 0.4 MG CAPS capsule Take 0.8 mg by mouth daily.    [provider]  timolol  (TIMOPTIC ) 0.5 % ophthalmic solution Place 1 drop into both eyes 2 (two) times daily.  05/30/15   [provider]  Tiotropium Bromide Monohydrate (SPIRIVA RESPIMAT) 2.5 MCG/ACT AERS Inhale 2.5 mcg into the lungs daily.    [provider]  traMADol  (ULTRAM ) 50 MG tablet  Take 1 tablet (50 mg total) by mouth every 6 (six) hours as needed. 06/09/22   Roslyn Coombe, MD  Travoprost , BAK Free, (TRAVATAN  Z) 0.004 % SOLN ophthalmic solution Place 1 drop into both eyes at bedtime. 09/14/13   Roslyn Coombe, MD  traZODone (DESYREL) 50 MG tablet Take 50 mg by mouth at bedtime. 11/12/23   [provider]  Insulin  Detemir (LEVEMIR ) 100 UNIT/ML Pen Inject 5 Units into the skin 2 (two) times daily. Patient not taking: Reported on 01/19/2020 08/21/19 01/19/20  Claretta Croft, MD  ipratropium-albuterol  (DUONEB) 0.5-2.5 (3) MG/3ML SOLN Take 3 mLs by nebulization every 6 (six) hours as needed. Patient not taking: Reported on 01/19/2020 10/25/19 01/19/20  Adra Alanis, FNP    Physical Exam: Vitals:   12/09/23 1415 12/09/23 1430 12/09/23 1445 12/09/23 1500  BP: 121/81 (!) 134/106 127/67 (!) 144/67  Pulse: (!) 57 (!) 58 (!) 56 (!) 55  Resp: (!) 23 (!) 21 11 16   Temp:      TempSrc:      SpO2: 100% 100% 100% 97%    Constitutional: No acute distress Head: Atraumatic Eyes: Conjunctiva clear ENM: Moist mucous membranes. Normal dentition.  Neck: Supple Respiratory: Clear to auscultation bilaterally, no wheezing/rales/rhonchi. Normal respiratory effort. No accessory muscle use. . Cardiovascular: Regular rate, mild brady, no murmur Abdomen: Non-tender, non-distended. No masses.   Musculoskeletal: No joint deformity upper and lower extremities. Normal ROM, no contractures. Normal muscle tone.  Skin: No rashes, lesions, or ulcers.  Extremities: No peripheral edema. Palpable peripheral pulses. Neurologic: Alert, moving all 4 extremities. Psychiatric: Normal insight and judgement.   Labs on Admission: I have personally reviewed following labs and imaging studies  CBC: Recent Labs  Lab 12/09/23 1154  WBC 8.7  NEUTROABS 6.8  HGB 13.2  HCT 39.4  MCV 85.5  PLT 201   Basic Metabolic Panel: Recent Labs  Lab 12/09/23 1154  NA 139  K 3.8  CL 106  CO2 20*  GLUCOSE  113*  BUN 11  CREATININE 1.09  CALCIUM  9.5   GFR: Estimated Creatinine Clearance: 48.3 mL/min (by C-G formula based on SCr of 1.09 mg/dL). Liver Function Tests: Recent Labs  Lab 12/09/23 1154  AST 22  ALT 14  ALKPHOS 71  BILITOT 0.7  PROT 6.3*  ALBUMIN  3.9   No results for input(s): "LIPASE", "AMYLASE" in the last 168 hours. No results for input(s): "AMMONIA" in the last 168 hours. Coagulation Profile: No results for input(s): "INR", "PROTIME" in the last 168 hours. Cardiac Enzymes: No results for input(s): "CKTOTAL", "CKMB", "CKMBINDEX", "TROPONINI" in the last 168 hours. BNP (last 3 results) No results for input(s): "PROBNP" in the last 8760 hours. HbA1C: No results for input(s): "HGBA1C" in the last 72 hours. CBG: No results for input(s): "GLUCAP" in the last 168 hours. Lipid Profile: No results for input(s): "CHOL", "HDL", "LDLCALC", "TRIG", "CHOLHDL", "LDLDIRECT" in the last 72 hours. Thyroid  Function Tests: No results for input(s): "TSH", "T4TOTAL", "FREET4", "T3FREE", "THYROIDAB" in the last 72 hours. Anemia Panel: No results for input(s): "VITAMINB12", "FOLATE", "FERRITIN", "TIBC", "IRON", "RETICCTPCT" in the last 72 hours. Urine analysis:    Component Value Date/Time   COLORURINE YELLOW 09/17/2022 1153   APPEARANCEUR CLEAR 09/17/2022 1153   LABSPEC 1.020 09/17/2022 1153   PHURINE 6.0 09/17/2022 1153   GLUCOSEU NEGATIVE 09/17/2022 1153   HGBUR NEGATIVE 09/17/2022 1153   BILIRUBINUR NEGATIVE 09/17/2022 1153   KETONESUR NEGATIVE 09/17/2022 1153   PROTEINUR 30 (A) 01/18/2020 2322  UROBILINOGEN 0.2 09/17/2022 1153   NITRITE NEGATIVE 09/17/2022 1153   LEUKOCYTESUR NEGATIVE 09/17/2022 1153    Radiological Exams on Admission: DG Chest 2 View Result Date: 12/09/2023 CLINICAL DATA:  Shortness of breath EXAM: CHEST - 2 VIEW COMPARISON:  Chest x-ray 10/24/2020 skip FINDINGS: The heart size and mediastinal contours are within normal limits. Both lungs are clear.  The visualized skeletal structures are unremarkable. Thoracic spinal cord stimulator device is present. IMPRESSION: No active cardiopulmonary disease. Electronically Signed   By: Tyron Gallon M.D.   On: 12/09/2023 14:49    EKG: Independently reviewed. Sinus bradycardia  Assessment/Plan Principal Problem:   Symptomatic hypotension Active Problems:   Bradycardia   CAD in native artery   CHF (congestive heart failure) (HCC)   Essential hypertension   Obesity (BMI 30-39.9)   History of bladder cancer   BPH (benign prostatic hyperplasia)   Pulmonary fibrosis, unspecified (HCC)   Stage 2 moderate COPD by GOLD classification (HCC)   Near syncope   # Symptomatic hypotension With bradycardia. Daughter administers meds so doesn't think received anything additional. New med memantine  can contribute, is on several (tramadol , flomax , primidone , gabapentin , lasix , losartan , meclizine ) that can contribute to these symptoms but all are stable and these symptoms are much more acute in onset. BPs have normalized here but remains bradycardic, no high degree block noted on EKG. No sig electrolyte or other lab abnormality - will hold home losartan  - check TTE - check am cortisol - dimer - given the bradycardia with symptoms will ask cardiology to evaluate - PT consult - tele  # MCI Recent b12/tsh wnl - hold home memantine  as above  # HTN As above - home losartan  on hold  # HFpEF On lasix  twice a week - hold lasix  - tte as above  # COPD Quiescent, satting 100% on room air - home spiriva   # Chronic pain - home tramadol , gabapentin   # Insomnia - home trazodone   # GAD - home celexa   # tremor - home primidone     DVT prophylaxis: lovenox  Code Status: dnr/dni, confirmed with patient with son at bedside today  Family Communication: son at bedside today  Consults called: cardiology   Level of care: Telemetry Cardiac Status is: Observation The patient remains OBS appropriate and  will d/c before 2 midnights.    Raymonde Calico MD [facility] Pager [phone]  If 7PM-7AM, please contact night-coverage [email or web address] Password [facility]  12/09/2023, 3:39 PM

## 2023-12-09 NOTE — ED Notes (Signed)
 Had measles as a child

## 2023-12-09 NOTE — ED Notes (Signed)
 Pt returned from xray

## 2023-12-09 NOTE — ED Provider Notes (Signed)
 Emergency Department Provider Note   I have reviewed the triage vital signs and the nursing notes.   HISTORY  Chief Complaint Shortness of Breath, Dizziness, and Near Syncope   HPI [name] is a 88 y.o. male with PMH of CAD, CHF, COPD, HLD, HTN presents to the ED with near syncope this morning.  He woke up feeling well but shortly after waking and before eating breakfast he began to feel very lightheaded like he may pass out.  His son came in the room and checked his blood pressure and was reportedly in the 60s systolic.  He was apparently diaphoretic and sweated through his T-shirt.  EMS was called and obtained pressures in the 80s.  He did have some tightness or pain.  He notes some mild shortness of breath.  He did take his blood pressure medications this morning.   Past Medical History:  Diagnosis Date   Allergic rhinitis    Bladder cancer (HCC)    BPH (benign prostatic hyperplasia)    Bradycardia    CAD (coronary artery disease)    Chronic low back pain    COPD (chronic obstructive pulmonary disease) (HCC)    DDD (degenerative disc disease), lumbosacral    Depression    Diverticulosis of colon (without mention of hemorrhage)    Essential tremor    Fatty liver disease, nonalcoholic    GERD (gastroesophageal reflux disease)    Glaucoma    History of bladder cancer    2004--  TCC low-grade , non-invasive   History of colon polyps    History of exercise stress test    02-05-2007-- ETT (Clinically positive, electrically equivocal, submaximal ETT,  appropriate bp response to exercise   History of hiatal hernia    History of kidney stones    History of peptic ulcer    HLD (hyperlipidemia)    HTN (hypertension)    Mild CAD    Nocturia    Pneumonia    Productive cough    Rheumatoid arthritis(714.0)    Right renal artery stenosis (HCC)    per cath 03-14-2009 --- 40-50%   RLS (restless legs syndrome)    Seizures (HCC)    Stroke (HCC)    TIA   Vertigo,  intermittent    Weak urinary stream    Wears dentures    upper   Wears glasses    Wears hearing aid    bilateral   Wears partial dentures    lower    Review of Systems  Constitutional: No fever/chills Cardiovascular: Positive chest pain and near syncope.  Respiratory: Mild shortness of breath. Gastrointestinal: No abdominal pain.  No nausea, no vomiting.  Skin: Negative for rash. Neurological: Negative for headaches.   ____________________________________________   PHYSICAL EXAM:  VITAL SIGNS: ED Triage Vitals  Encounter Vitals Group     BP 12/09/23 1118 (!) 157/61     Pulse Rate 12/09/23 1118 61     Resp 12/09/23 1118 16     Temp 12/09/23 1118 98.1 F (36.7 C)     Temp Source 12/09/23 1118 Oral     SpO2 12/09/23 1118 99 %    Constitutional: Alert and oriented. Well appearing and in no acute distress. Eyes: Conjunctivae are normal.  Head: Atraumatic. Nose: No congestion/rhinnorhea. Mouth/Throat: Mucous membranes are moist.   Neck: No stridor.  Cardiovascular: Normal rate, regular rhythm. Good peripheral circulation. Grossly normal heart sounds.   Respiratory: Normal respiratory effort.  No retractions. Lungs CTAB. Gastrointestinal: Soft and nontender.  No distention.  Musculoskeletal: No lower extremity tenderness nor edema. No gross deformities of extremities. Neurologic:  Normal speech and language.  Skin:  Skin is warm, dry and intact. No rash noted.   ____________________________________________   LABS (all labs ordered are listed, but only abnormal results are displayed)  Labs Reviewed  COMPREHENSIVE METABOLIC PANEL WITH GFR - Abnormal; Notable for the following components:      Result Value   CO2 20 (*)    Glucose, Bld 113 (*)    Total Protein 6.3 (*)    All other components within normal limits  BRAIN NATRIURETIC PEPTIDE - Abnormal; Notable for the following components:   B Natriuretic Peptide 101.5 (*)    All other components within normal  limits  BASIC METABOLIC PANEL WITH GFR - Abnormal; Notable for the following components:   Glucose, Bld 102 (*)    All other components within normal limits  RESP PANEL BY RT-PCR (RSV, FLU A&B, COVID)  [id number]  CBC WITH DIFFERENTIAL/PLATELET  CK  TSH  CORTISOL-AM, BLOOD  D-DIMER, QUANTITATIVE (NOT AT [facility])  TROPONIN I (HIGH SENSITIVITY)  TROPONIN I (HIGH SENSITIVITY)   ____________________________________________  EKG   EKG Interpretation Date/Time:  Wednesday Dec 09 2023 11:18:27 EDT Ventricular Rate:  57 PR Interval:  187 QRS Duration:  93 QT Interval:  399 QTC Calculation: 389 R Axis:   16  Text Interpretation: Sinus rhythm Confirmed by Abby Hocking [phone]) on 12/09/2023 11:23:04 AM       ____________________________________________   PROCEDURES  Procedure(s) performed:   Procedures  None  ____________________________________________   INITIAL IMPRESSION / ASSESSMENT AND PLAN / ED COURSE  Pertinent labs & imaging results that were available during my care of the patient were reviewed by me and considered in my medical decision making (see chart for details).   This patient is Presenting for Evaluation of near syncope, which does require a range of treatment options, and is a complaint that involves a high risk of morbidity and mortality.  The Differential Diagnoses include CHF exacerbation, arrhythmia, BP med issue, AKI, ACS, PE, etc.  I did obtain Additional Historical Information from son at bedside.   Clinical Laboratory Tests Ordered, included minimally elevated BNP to 101. Troponin negative. No AKI.   Radiologic Tests Ordered, included CXR. I independently interpreted the images and agree with radiology interpretation.   Cardiac Monitor Tracing which shows NSR.    Social Determinants of Health Risk patient is a non-smoker.   Consult complete with [facility]. Plan for admit.   Medical Decision Making: Summary:  Patient presents to the emergency  department for evaluation of near syncope.  He was diaphoretic at home with hypotension and improved with IV fluid administration.  Unclear if this is a blood pressure medication issue or arrhythmia versus more serious cardiogenic etiology.  Plan for screening blood work and reassess.  Reevaluation with update and discussion with patient and family at bedside. Plan for obs admit. They are in agreement.   Patient's presentation is most consistent with acute presentation with potential threat to life or bodily function.   Disposition: admit  ____________________________________________  FINAL CLINICAL IMPRESSION(S) / ED DIAGNOSES  Final diagnoses:  Near syncope     Note:  This document was prepared using Dragon voice recognition software and may include unintentional dictation errors.  Abby Hocking, MD, [facility] Emergency Medicine    Allexa Acoff, Shereen Dike, MD 12/23/23 [phone]

## 2023-12-09 NOTE — Consult Note (Cosign Needed Addendum)
 Cardiology Consultation   Patient ID: [name] MRN: [id number]; DOB: 1936-03-20  Admit date: 12/09/2023 Date of Consult: 12/09/2023  PCP:  Adelia Homestead, MD   [facility] Providers Cardiologist:  Jerryl Morin, DO        Patient Profile:   [name] is a 88 y.o. male with a hx of nonobstructive CAD by cath 2010, mild AI, mild dilation of ascending aorta, suspected dementia (seeing neurology), HTN, HLD, COPD, bladder cancer, BPH, depression, DDD, essential tremor, hiatal hernia, TIA, RLS, moderate right renal artery stenosis by cath 2010, chronic pain who is being seen 12/09/2023 for the evaluation of bradycardia and near-syncope at the request of Dr. Sari Cunning.  History of Present Illness:   [name] had a remote cath in 2010 showing 20-30% lesions in the LAD, diagonal OM1, OM2, PLA, along with 40-50% RRAS. Last ischemic eval was by nuc in 2021 which was normal. 2D echo 2021 showed EF 60-65%, G1DD, mild-moderate LAE, mild AI, mild dilation of ascending aorta. CHF listed in other notes but not recent cardiology notes. The patient had been on Lasix  historically for edema that tended to flare up in the summer months.  Daughter in law and son helped assist the patient with recent history. He was started on Namenda  by neurology due to concern for developing Alzheimer's about a month ago. He also saw the [place] a few weeks back for low BP at which time amlodipine  was decreased. He saw local PCP 5/19 for continued issues with low BP and lightheadedness. Amlodipine  was stopped altogether and Lasix  was reduced to once a week, last took Lasix  yesterday. Today while seated he developed severe lightheadedness, diaphoresis and near-syncope. Son checked SBP in the 50s-60s so EMS was called.[facility] was called with SBP 86 per triage note. Upon arrival, BP 157/61. CXR NAD. BNP 101, hsTroponin neg x1, Hgb/Cr stable. EKG shows SB 57bpm, subtle ST sagging II, avL, otherwise nonacute, unchanged from  prior. No CP, syncope, palpitations. HRs at home have been 50 and above. HRs here are all 50+, no pauses or AV block.  He reports he has is chronically dizzy regardless of position or position changes (including now when HR high 50s, SBP 130s/100s). He also senses that his vision becomes dark when he tilts his head down - this happened years ago as well. He is noted to have gone from weight of 201 in November down to 176. His family states he is just not eating well at home. His appetite is poor.  Past Medical History:  Diagnosis Date   Acute bronchitis per pt no fever--  cough since discharge from hospital 07-02-2015   per pcp note 07-05-2015  mild to moderate bronchitis vs pna   Allergic rhinitis    Bladder cancer (HCC)    BPH (benign prostatic hyperplasia)    Bradycardia    CAD (coronary artery disease)    CHF (congestive heart failure) (HCC)    Chronic low back pain    COPD (chronic obstructive pulmonary disease) (HCC)    DDD (degenerative disc disease), lumbosacral    Depression    Diverticulosis of colon (without mention of hemorrhage)    Dyspnea    Essential tremor    Fatty liver disease, nonalcoholic    GERD (gastroesophageal reflux disease)    Glaucoma    History of bladder cancer    2004--  TCC low-grade , non-invasive   History of colon polyps    History of exercise stress test  02-05-2007-- ETT (Clinically positive, electrically equivocal, submaximal ETT,  appropriate bp response to exercise   History of hiatal hernia    History of kidney stones    History of peptic ulcer    HLD (hyperlipidemia)    HTN (hypertension)    Nocturia    Pneumonia    Productive cough    Rheumatoid arthritis(714.0)    Right renal artery stenosis (HCC)    per cath 03-14-2009 --- 40-50%   RLS (restless legs syndrome)    Seizures (HCC)    Stroke (HCC)    TIA   Vertigo, intermittent    Weak urinary stream    Wears dentures    upper   Wears glasses    Wears hearing aid    bilateral    Wears partial dentures    lower    Past Surgical History:  Procedure Laterality Date   CARDIAC CATHETERIZATION  02-12-2007  dr gregg taylor   Non-obstructive CAD,  20% pLCX,  20% LAD, ef 65%   CARDIAC CATHETERIZATION  03-14-2009  dr Stann Earnest   pLAD and mid diaginal 20-30% multiple lesions,  OM1 20%, right renal ostial 40-50%,  ef 60%   CATARACT EXTRACTION W/ INTRAOCULAR LENS  IMPLANT, BILATERAL  07/14/2014   CYSTOSCOPY WITH BIOPSY N/A 07/11/2015   Procedure: CYSTOSCOPY WITH COLD CUP RESECTION AND FULGERATION;  Surgeon: Ann Barnacle, MD;  Location: [facility];  Service: Urology;  Laterality: N/A;   HIP ARTHROSCOPY W/ LABRAL DEBRIDEMENT Left 06/29/2000   and chondraplasty   JOINT REPLACEMENT     right big toe replacement   KYPHOPLASTY N/A 03/28/2021   Procedure: KYPHOPLASTY lumbar one;  Surgeon: Mort Ards, MD;  Location: [facility] OR;  Service: Orthopedics;  Laterality: N/A;   LUMBAR DISC SURGERY  07/16/2005   LUMBAR LAMINECTOMY/DECOMPRESSION MICRODISCECTOMY  07/09/2011   Procedure: LUMBAR LAMINECTOMY/DECOMPRESSION MICRODISCECTOMY;  Surgeon: Loel Ring;  Location: [facility];  Service: Orthopedics;  Laterality: Left;  Decompression L5 - S1 on the Left and repair of dura   LUMBAR LAMINECTOMY/DECOMPRESSION MICRODISCECTOMY N/A 07/27/2013   Procedure: DECOMPRESSION L4 - L5 WITH EXCISION OF SYNOVIAL CYST AND, LATERAL MASS FUSION 1 LEVEL;  Surgeon: Loel Ring, MD;  Location: [facility];  Service: Orthopedics;  Laterality: N/A;   LUMBAR LAMINECTOMY/DECOMPRESSION MICRODISCECTOMY Left 07/06/2014   Procedure:  MICRO LUMBAR DECOMPRESSION L5-S1 ON LEFT, L4-5 REDO;  Surgeon: Loel Ring, MD;  Location: [facility];  Service: Orthopedics;  Laterality: Left;   SPINAL CORD STIMULATOR BATTERY EXCHANGE N/A 03/28/2021   Procedure: SPINAL CORD STIMULATOR BATTERY EXCHANGE/REPLACEMENT, L1 KYPHOPLASTY;  Surgeon: Mort Ards, MD;  Location: [facility] OR;  Service: Orthopedics;  Laterality: N/A;  90  MINS LOCAL WITH IV REG   SPINAL CORD STIMULATOR INSERTION N/A 09/11/2016   Procedure: Spinal cord stimulator placement;  Surgeon: Mort Ards, MD;  Location: [facility] OR;  Service: Orthopedics;  Laterality: N/A;   TONSILLECTOMY AND ADENOIDECTOMY     TRANSTHORACIC ECHOCARDIOGRAM  09/20/2010   normal LVF, ef 55-65%/  mild MR, TR and PR/  mild LAE   TRANSURETHRAL RESECTION OF BLADDER TUMOR  09/21/2002     Home Medications:  Prior to Admission medications   Medication Sig Start Date End Date Taking? Authorizing Provider  acetaminophen  (TYLENOL ) 650 MG CR tablet Take 1,300 mg by mouth at bedtime.   Yes [provider]  albuterol  (VENTOLIN  HFA) 108 (90 Base) MCG/ACT inhaler Inhale 2 puffs into the lungs every 6 (six) hours as needed for wheezing or shortness of breath. 10/31/19  Yes Roslyn Coombe, MD  ammonium lactate (LAC-HYDRIN) 12 % lotion Apply 1 Application topically as needed for dry skin. 11/12/23  Yes [provider]  aspirin  EC 81 MG tablet Take 81 mg by mouth in the morning. Swallow whole.   Yes [provider]  brimonidine  (ALPHAGAN ) 0.15 % ophthalmic solution Place 1 drop into both eyes 2 (two) times daily.   Yes [provider]  cetirizine  (ZYRTEC ) 10 MG tablet Take 10 mg by mouth in the morning.   Yes [provider]  clotrimazole  (LOTRIMIN ) 1 % cream Apply 1 application  topically at bedtime as needed (FOR RASH). Prescribed for rash in groin   Yes [provider]  fluticasone  (FLONASE ) 50 MCG/ACT nasal spray USE 2 SPRAYS IN EACH NOSTRIL DAILY Patient taking differently: Place 2 sprays into both nostrils daily. 11/02/20  Yes Roslyn Coombe, MD  furosemide  (LASIX ) 20 MG tablet TAKE 1 TABLET BY MOUTH TWICE A WEEK ON TUESDAY AND SATURDAY Patient taking differently: Take 20 mg by mouth once a week. TAKE 1 TABLET BY MOUTH ONCE A WEEK ON TUESDAY 07/16/23  Yes Tobb, Kardie, DO  gabapentin  (NEURONTIN ) 300 MG capsule TAKE 3-4 CAPSULES AT  BEDTIME Patient taking differently: Take 900-1,200 mg by mouth at bedtime. TAKE 3-4 CAPSULES AT BEDTIME 05/05/23  Yes Adelia Homestead, MD  hydrALAZINE  (APRESOLINE ) 10 MG tablet TAKE 1 TABLET THREE TIMES DAILY AS NEEDED FOR BLOOD PRESSURE GREATER THAN 150/90 06/09/22  Yes Roslyn Coombe, MD  losartan  (COZAAR ) 100 MG tablet TAKE 1 TABLET EVERY DAY Patient taking differently: Take 100 mg by mouth in the morning. 04/16/23  Yes Roslyn Coombe, MD  meclizine  (ANTIVERT ) 25 MG tablet Take 25 mg by mouth in the morning and at bedtime. 09/28/23  Yes [provider]  memantine  (NAMENDA ) 5 MG tablet Take 1 tablet (5 mg at night) for 2 weeks, then increase to 1 tablet (5 mg) twice a day Patient taking differently: Take 5 mg by mouth 2 (two) times daily. 10/16/23  Yes Wertman, Sara E, PA-C  nitroGLYCERIN  (NITROSTAT ) 0.4 MG SL tablet PLACE 1 TABLET UNDER THE TONGUE EVERY 5 MINUTES AS NEEDED FOR CHEST PAIN. MAX 3 DOSES. CALL 911 IF NO IMPROVEMENT AFTER 1ST DOSE Patient taking differently: Place 0.4 mg under the tongue every 5 (five) minutes as needed for chest pain. 02/01/18  Yes Roslyn Coombe, MD  omeprazole  (PRILOSEC) 40 MG capsule Take 1 capsule (40 mg total) by mouth daily. TAKE 1 CAPSULE TWICE DAILY Patient taking differently: Take 20 mg by mouth in the morning and at bedtime. 04/27/23  Yes Adelia Homestead, MD  ondansetron  (ZOFRAN ) 4 MG tablet Take 1 tablet (4 mg total) by mouth 2 (two) times daily. Patient taking differently: Take 4 mg by mouth as needed for nausea or vomiting. 05/05/23  Yes Adelia Homestead, MD  polyvinyl alcohol  (LIQUIFILM TEARS) 1.4 % ophthalmic solution Place 1 drop into both eyes in the morning and at bedtime.   Yes [provider]  potassium chloride  (KLOR-CON  10) 10 MEQ tablet Take 1 tablet (10 mEq total) by mouth daily. Patient taking differently: Take 10 mEq by mouth once a week. On Tuesdays 02/28/22  Yes Roslyn Coombe, MD  primidone  (MYSOLINE ) 50 MG tablet  Take 50 mg by mouth in the morning.   Yes [provider]  simvastatin  (ZOCOR ) 40 MG tablet Take 1 tablet (40 mg total) by mouth daily at 6 PM. Patient taking differently: Take 40 mg  by mouth at bedtime. 05/05/23  Yes Adelia Homestead, MD  tamsulosin  (FLOMAX ) 0.4 MG CAPS capsule Take 0.8 mg by mouth every evening.   Yes [provider]  timolol  (TIMOPTIC ) 0.5 % ophthalmic solution Place 1 drop into both eyes 2 (two) times daily.  05/30/15  Yes [provider]  Tiotropium Bromide Monohydrate (SPIRIVA RESPIMAT) 2.5 MCG/ACT AERS Inhale 2.5 mcg into the lungs in the morning.   Yes [provider]  traMADol  (ULTRAM ) 50 MG tablet Take 1 tablet (50 mg total) by mouth every 6 (six) hours as needed. 06/09/22  Yes Roslyn Coombe, MD  traZODone (DESYREL) 50 MG tablet Take 50 mg by mouth at bedtime. 11/12/23  Yes [provider]  vitamin B-12 (CYANOCOBALAMIN ) 500 MCG tablet Take 500 mcg by mouth in the morning.   Yes [provider]  Insulin  Detemir (LEVEMIR ) 100 UNIT/ML Pen Inject 5 Units into the skin 2 (two) times daily. Patient not taking: Reported on 01/19/2020 08/21/19 01/19/20  Claretta Croft, MD  ipratropium-albuterol  (DUONEB) 0.5-2.5 (3) MG/3ML SOLN Take 3 mLs by nebulization every 6 (six) hours as needed. Patient not taking: Reported on 01/19/2020 10/25/19 01/19/20  Adra Alanis, FNP    Inpatient Medications: Scheduled Meds:  citalopram   20 mg Oral Daily   enoxaparin  (LOVENOX ) injection  40 mg Subcutaneous Q24H   gabapentin   300 mg Oral QHS   latanoprost   1 drop Both Eyes QHS   primidone   250 mg Oral QID   simvastatin   40 mg Oral q1800   sodium chloride  flush  3 mL Intravenous Q12H   tamsulosin   0.8 mg Oral Daily   timolol   1 drop Both Eyes BID   Tiotropium Bromide Monohydrate  2.5 mcg Inhalation Daily   traZODone  50 mg Oral QHS   Continuous Infusions:  sodium chloride      PRN Meds: sodium chloride , sodium chloride  flush,  traMADol   Allergies:    Allergies  Allergen Reactions   Morphine  And Codeine Other (See Comments)    Migraine    Sulfa Antibiotics Itching and Rash   Hydrochlorothiazide  Other (See Comments)    hyponatremia with daily use   Tape Itching and Other (See Comments)    Patient  Is ok to use paper tape   Hytrin [Terazosin] Other (See Comments)    Dizziness   Latex Rash   Lisinopril  Cough    Social History:   Social History   Socioeconomic History   Marital status: Widowed    Spouse name: Not on file   Number of children: 2   Years of education: 8   Highest education level: Not on file  Occupational History   Occupation: retired from [employer]    Employer: RETIRED  Tobacco Use   Smoking status: Former    Current packs/day: 0.00    Average packs/day: 2.0 packs/day for 20.0 years (40.0 ttl pk-yrs)    Types: Cigarettes    Start date: 10/08/1962    Quit date: 10/08/1982    Years since quitting: 41.1   Smokeless tobacco: Never  Vaping Use   Vaping status: Never Used  Substance and Sexual Activity   Alcohol  use: Not Currently    Comment: rare wine or beer occassionally   Drug use: No   Sexual activity: Never  Other Topics Concern   Not on file  Social History Narrative   Right handed   Drinks caffeine prn   Lives with son [place]   Has a daughter Edwina Gram  One level home   Social Drivers of Health   Financial Resource Strain: Low Risk  (04/13/2023)   Overall Financial Resource Strain (CARDIA)    Difficulty of Paying Living Expenses: Not hard at all  Food Insecurity: No Food Insecurity (04/13/2023)   Hunger Vital Sign    Worried About Running Out of Food in the Last Year: Never true    Ran Out of Food in the Last Year: Never true  Transportation Needs: No Transportation Needs (04/13/2023)   PRAPARE - Administrator, Civil Service (Medical): No    Lack of Transportation (Non-Medical): No  Physical Activity: Inactive (04/13/2023)   Exercise Vital Sign    Days of  Exercise per Week: 0 days    Minutes of Exercise per Session: 0 min  Stress: No Stress Concern Present (04/13/2023)   [employer] of Occupational Health - Occupational Stress Questionnaire    Feeling of Stress : Not at all  Social Connections: Moderately Isolated (04/13/2023)   Social Connection and Isolation Panel [NHANES]    Frequency of Communication with Friends and Family: More than three times a week    Frequency of Social Gatherings with Friends and Family: Once a week    Attends Religious Services: More than 4 times per year    Active Member of [employer] or Organizations: No    Attends Banker Meetings: Never    Marital Status: Widowed  Intimate Partner Violence: Not At Risk (07/09/2023)   Received from [facility]   HITS    Over the last 12 months how often did your partner physically hurt you?: Never    Over the last 12 months how often did your partner insult you or talk down to you?: Never    Over the last 12 months how often did your partner threaten you with physical harm?: Never    Over the last 12 months how often did your partner scream or curse at you?: Never    Family History:   Family History  Problem Relation Age of Onset   Heart attack Mother    Cancer Father        lung   Cancer Sister        lung   Diabetes Sister    Cancer Brother        lung that spread to brain     ROS:  Please see the history of present illness.  All other ROS reviewed and negative.     Physical Exam/Data:   Vitals:   12/09/23 1500 12/09/23 1515 12/09/23 1530 12/09/23 1548  BP: (!) 144/67 124/67 136/82   Pulse: (!) 55 (!) 56 (!) 57   Resp: 16 15 18    Temp:    98.1 F (36.7 C)  TempSrc:    Oral  SpO2: 97% 96% 100%    No intake or output data in the 24 hours ending 12/09/23 1551    11/30/2023    9:20 AM 10/16/2023    1:16 PM 07/21/2023    4:06 PM  Last 3 Weights  Weight (lbs) 176 lb 177 lb 185 lb  Weight (kg) 79.833 kg 80.287 kg 83.915 kg     There is no  height or weight on file to calculate BMI.  General: Well developed, well nourished, in no acute distress. Head: Normocephalic, atraumatic, sclera non-icteric, no xanthomas, nares are without discharge. Neck: Negative for carotid bruits. JVP not elevated. Lungs: Clear bilaterally to auscultation without wheezes, rales, or rhonchi. Breathing is  unlabored. Heart: RRR S1 S2 without murmurs, rubs, or gallops.  Abdomen: Soft, non-tender, non-distended with normoactive bowel sounds. No rebound/guarding. Extremities: No clubbing or cyanosis. No edema. Distal pedal pulses are 2+ and equal bilaterally. Neuro: Alert and oriented X 3. Moves all extremities spontaneously. Psych:  Responds to questions appropriately with a normal affect.   EKG:  The EKG was personally reviewed and demonstrates:   SB 57bpm, subtle ST sagging II, avL, otherwise nonacute, unchanged from prior.    Telemetry:  Telemetry was personally reviewed and demonstrates:  SB-NSR 50s and above  Relevant CV Studies: Echo 2021   1. Left ventricular ejection fraction, by estimation, is 60 to 65%. The  left ventricle has normal function. The left ventricle has no regional  wall motion abnormalities. Left ventricular diastolic parameters are  consistent with Grade I diastolic  dysfunction (impaired relaxation).   2. Right ventricular systolic function is normal. The right ventricular  size is normal. There is normal pulmonary artery systolic pressure.   3. Left atrial size was mild to moderately dilated.   4. The mitral valve is normal in structure. No evidence of mitral valve  regurgitation. No evidence of mitral stenosis.   5. The aortic valve is normal in structure. Aortic valve regurgitation is  mild. Mild aortic valve sclerosis is present, with no evidence of aortic  valve stenosis.   6. There is mild dilatation of the ascending aorta measuring 40 mm.   7. The inferior vena cava is normal in size with greater than 50%   respiratory variability, suggesting right atrial pressure of 3 mmHg.    Laboratory Data:  High Sensitivity Troponin:   Recent Labs  Lab 12/09/23 1154 12/09/23 1354  TROPONINIHS 7 6     Chemistry Recent Labs  Lab 12/09/23 1154  NA 139  K 3.8  CL 106  CO2 20*  GLUCOSE 113*  BUN 11  CREATININE 1.09  CALCIUM  9.5  GFRNONAA >60  ANIONGAP 13    Recent Labs  Lab 12/09/23 1154  PROT 6.3*  ALBUMIN  3.9  AST 22  ALT 14  ALKPHOS 71  BILITOT 0.7   Lipids No results for input(s): "CHOL", "TRIG", "HDL", "LABVLDL", "LDLCALC", "CHOLHDL" in the last 168 hours.  Hematology Recent Labs  Lab 12/09/23 1154  WBC 8.7  RBC 4.61  HGB 13.2  HCT 39.4  MCV 85.5  MCH 28.6  MCHC 33.5  RDW 13.4  PLT 201   Thyroid  No results for input(s): "TSH", "FREET4" in the last 168 hours.  BNP Recent Labs  Lab 12/09/23 1154  BNP 101.5*    DDimer No results for input(s): "DDIMER" in the last 168 hours.   Radiology/Studies:  DG Chest 2 View Result Date: 12/09/2023 CLINICAL DATA:  Shortness of breath EXAM: CHEST - 2 VIEW COMPARISON:  Chest x-ray 10/24/2020 skip FINDINGS: The heart size and mediastinal contours are within normal limits. Both lungs are clear. The visualized skeletal structures are unremarkable. Thoracic spinal cord stimulator device is present. IMPRESSION: No active cardiopulmonary disease. Electronically Signed   By: Tyron Gallon M.D.   On: 12/09/2023 14:49      Assessment and Plan:  1. Near-syncope, hypotension, shortness of breath, diaphoresis in the context of weight loss and poor appetite, superimposed on chronic dizziness - I suspect recent blood pressure issues have been precipitated by 25lb weight loss which has likely necessitated lower doses of his antihypertensives. HR 50s is chronic and unchanged, so do not think bradycardia is a primary problem here -  he likely previously required Lasix  in the context of edema from amlodipine  which he is no longer on. Volume  status looks normal and oral intake has been poor, so would discontinue Lasix  altogether - hold on further losartan  at this time - may require restart of lower dose in the future, but would not advocate for strict blood pressure control - dietitian consult for weight loss - await echocardiogram - there is a component of his chronic dizziness that is non-positional, chronic, and occurring even in the context of normal VS presently therefore unlikely cardiac in nature. He also relays some degree of vision darkening when nodding his head downward. I have relayed this to Dr. Sari Cunning in case additional neurologic imaging is needed. I will order carotid duplex as daughter-in-law reports he was told in the past he had some carotid blockages as well - recommend patient have orthostatic VS in AM - would benefit from ensuring these have improved prior to discharge. If drop in BP persists, consider compression hose. There is also sometimes overlap of autonomic dysfunction that can co-occur with dementia so would continue to benefit from PCP/neuro follow-up. Would also consider alternative to tamsulosin  if issue persists.  2. Sinus bradycardia - HRs this presentation have been 50 and above, consistent with patient's baseline HR known for several years, suspect noncontributory - check TSH for completeness - follow on telemetry  3. Nonobstructive CAD 2010 - no recent angina - as a precaution, hold statin and check CK given the weakness, can revisit resumption in AM - if still fall risk at dc, may need to reconsider prophylactic ASA  4. Mild AI, mild dilation ascending aorta - echo pending   Risk Assessment/Risk Scores:       For questions or updates, please contact [facility] Please consult [email or web address] for contact info under    Signed, Kc Sedlak N Caley Volkert, PA-C  12/09/2023 3:51 PM  Patient seen and examined with Hydia Copelin PA-C.  Agree as above, with the following exceptions and changes as noted  below.  Patient is an 88 year old male with family at the bedside who presents after a near syncopal episode with noted severe hypotension which family suggests is positional and overall stable appearing sinus bradycardia.  Cardiology asked to comment on near syncope.  Patient is comfortable in the bed but family notes that he has dementia and was recently started on Namenda .  Has also been experiencing hypotension for which his amlodipine  was lowered and then stopped and Lasix  was reduced.  He had an episode today where he developed significant lightheadedness diaphoresis and near syncope with a blood pressure recording in the 50s systolic, upon EMS presentation systolic blood pressure in the 80s.  Blood pressure has now stabilized in hospital.  He did take his blood pressure medications this morning.. Gen: NAD, CV: Regular rhythm bradycardic, no murmurs, Lungs: clear, Abd: soft, Extrem: Warm, well perfused, no edema, Neuro/Psych: alert and oriented x 3, normal mood and affect. All available labs, radiology testing, previous records reviewed.  Patient likely has autonomic dysfunction which may be age-related and is demonstrating orthostatic symptoms.  I have occasionally had success with patients stopping losartan  in particular after experiencing vasodepressive syncope.  Optimally he would discontinue losartan .  We can monitor blood pressures in the hospital and select a more appropriate agent if needed for blood pressures that are uncontrolled.  Will obtain an echocardiogram today.  Encourage oral intake and hold Lasix  at this time.  Cardiology will follow.  Gayatri A Acharya, MD  12/10/23 8:17 AM

## 2023-12-09 NOTE — Plan of Care (Signed)

## 2023-12-09 NOTE — ED Triage Notes (Signed)
 Has been trying to regulate b/p.  This am dizzy with sob.near syncopal.  EMS reports bp of 86/.

## 2023-12-10 ENCOUNTER — Other Ambulatory Visit: Payer: Self-pay | Admitting: Internal Medicine

## 2023-12-10 ENCOUNTER — Observation Stay (HOSPITAL_BASED_OUTPATIENT_CLINIC_OR_DEPARTMENT_OTHER)

## 2023-12-10 DIAGNOSIS — I9589 Other hypotension: Secondary | ICD-10-CM | POA: Diagnosis not present

## 2023-12-10 DIAGNOSIS — I959 Hypotension, unspecified: Secondary | ICD-10-CM | POA: Diagnosis not present

## 2023-12-10 DIAGNOSIS — I6529 Occlusion and stenosis of unspecified carotid artery: Secondary | ICD-10-CM

## 2023-12-10 LAB — ECHOCARDIOGRAM COMPLETE
AR max vel: 3.06 cm2
AV Area VTI: 3.37 cm2
AV Area mean vel: 2.65 cm2
AV Mean grad: 3 mmHg
AV Peak grad: 5.5 mmHg
Ao pk vel: 1.17 m/s
Area-P 1/2: 2.87 cm2
Calc EF: 73.9 %
Height: 63 in
MV VTI: 2.84 cm2
P 1/2 time: 1002 ms
S' Lateral: 2.6 cm
Single Plane A2C EF: 73 %
Single Plane A4C EF: 75.3 %
Weight: 2771.2 [oz_av]

## 2023-12-10 LAB — TSH: TSH: 2.087 u[IU]/mL (ref 0.350–4.500)

## 2023-12-10 LAB — BASIC METABOLIC PANEL WITH GFR
Anion gap: 11 (ref 5–15)
BUN: 9 mg/dL (ref 8–23)
CO2: 23 mmol/L (ref 22–32)
Calcium: 9.2 mg/dL (ref 8.9–10.3)
Chloride: 106 mmol/L (ref 98–111)
Creatinine, Ser: 0.95 mg/dL (ref 0.61–1.24)
GFR, Estimated: >60 mL/min (ref >60)
Glucose, Bld: 102 mg/dL — ABNORMAL HIGH (ref 70–99)
Potassium: 3.8 mmol/L (ref 3.5–5.1)
Sodium: 140 mmol/L (ref 135–145)

## 2023-12-10 LAB — CORTISOL-AM, BLOOD: Cortisol - AM: 15.6 ug/dL (ref 6.7–22.6)

## 2023-12-10 MED ORDER — MEMANTINE HCL 5 MG PO TABS: 5.0000 mg | ORAL_TABLET | Freq: Two times a day (BID) | ORAL | Status: DC

## 2023-12-10 MED ORDER — GABAPENTIN 300 MG PO CAPS: 300.0000 mg | ORAL_CAPSULE | Freq: Every day | ORAL | Status: AC

## 2023-12-10 MED ORDER — ONDANSETRON HCL 4 MG PO TABS: 4.0000 mg | ORAL_TABLET | ORAL | Status: AC | PRN

## 2023-12-10 MED ORDER — FUROSEMIDE 20 MG PO TABS: 20.0000 mg | ORAL_TABLET | Freq: Every day | ORAL | Status: AC | PRN

## 2023-12-10 NOTE — Progress Notes (Signed)
 Dr. Maury Space states that patient does not need ordered urinalysis before discharge.

## 2023-12-10 NOTE — Progress Notes (Addendum)
   Patient Name: [name] Date of Encounter: 12/10/2023 [facility] Cardiologist: Kardie Tobb, DO   Interval Summary  .    Patient reports feeling good Mildly dizzy after going on a walk in the hall  Orthostatics negative  BP recovered nicely   Vital Signs .    Vitals:   12/10/23 0415 12/10/23 0416 12/10/23 0812 12/10/23 0926  BP: (!) 127/53  120/66   Pulse: 63  69   Resp: 17  18   Temp: 98.2 F (36.8 C)  98.4 F (36.9 C)   TempSrc: Oral  Oral   SpO2: 96%  95% 96%  Weight:  78.6 kg    Height:       No intake or output data in the 24 hours ending 12/10/23 1012    12/10/2023    4:16 AM 12/09/2023    4:52 PM 11/30/2023    9:20 AM  Last 3 Weights  Weight (lbs) 173 lb 3.2 oz 175 lb 14.8 oz 176 lb  Weight (kg) 78.563 kg 79.8 kg 79.833 kg       Telemetry/ECG .    Sinus, 60s - Personally Reviewed  Physical Exam .   GEN: No acute distress.   Neck: No JVD Cardiac: RRR, no murmurs, rubs, or gallops.  Respiratory: Clear to auscultation bilaterally. GI: Soft, nontender, non-distended  MS: No edema  Assessment & Plan .   88 y.o. male with a hx of nonobstructive CAD by cath 2010, mild AI, mild dilation of ascending aorta, suspected dementia (seeing neurology), HTN, HLD, COPD, bladder cancer, BPH, depression, DDD, essential tremor, hiatal hernia, TIA, RLS, moderate right renal artery stenosis by cath 2010, chronic pain who is being seen for bradycardia and near-syncope    Near syncope Hypotension Chronic dizziness  Sinus bradycardia  Presented with reported SBP in 50s, EMS noted SBP in 80s Home meds: Lasix  20 mg twice a week, hydralazine  PRN, losartan  100 mg daily Antihypertensives have been held since admission BP recovered, most recent 120/66 Negative orthostatic vitals  Pending echocardiogram  Continue to encourage PO intake  Holding home antihypertensives, Lasix  -- will likely add back as PRN at discharge  Ordered TED hose   Nonobstructive  CAD Hyperlipidemia  No recent chest pain Home meds: ASA 81 mg daily, simvastatin  40 mg daily   Per primary COPD Chronic pain Insomnia Mood disorders Dementia BPH Tremors   For questions or updates, please contact [facility] Please consult [email or web address] for contact info under        Signed, Jiles Mote, PA-C  Patient seen and examined with TP PA-C.  Agree as above, with the following exceptions and changes as noted below. Feels better sitting up in chair, BP looks stable. HR normal now. Gen: NAD, CV: RRR, no murmurs, Lungs: clear, Abd: soft, Extrem: Warm, well perfused, no edema, Neuro/Psych: alert and oriented x 3, normal mood and affect. All available labs, radiology testing, previous records reviewed.  - encourage PO intake and calorie dense foods. - lasix  should be used PRN - hydralazine  can be used PRN BP >160/100 - discontinue losartan , consider avoiding using in future given potential for vasodepressor syncope contribution.  - recommend medium strength compression socks at home 15-20 mmHg as tolerated, as this may assist in orthostasis.  - preliminary review of echocardiogram shows no significant valvular heart disease and grossly normal biventricular function.   Overall, stable for discharge from CV perspective.   Lugene Beougher A Gasper Hopes, MD 12/10/23 11:05 AM

## 2023-12-10 NOTE — Plan of Care (Signed)

## 2023-12-10 NOTE — Progress Notes (Signed)
 Discharge instructions provided by SWOT RN [facility]). Patient discharged to home with all belongings at 1305 on 12/10/23 via wheelchair by NT.

## 2023-12-10 NOTE — Progress Notes (Signed)
Ted hose applied as ordered.

## 2023-12-10 NOTE — Progress Notes (Signed)
 Nutrition Brief Note  RD received a consult for poor PO intake. Met with pt and family in room prior to discharge. Pt reports that his appetite has been poor for a little while now even at home, denies any changes in appetite during admission. Reports he drinks a protein shake at home. RD encouraged pt to utilize nutrition supplements in the setting of poor PO intake and to maintain lean muscle mass. Denies any nausea or vomiting.  Pt reports weight loss, but he has been trying to over the past few years. Reports a UBW of ~200# and now down near 173#.    Pt and family with no questions or concerns at this time.   Doneta Furbish RD, LDN Clinical Dietitian

## 2023-12-10 NOTE — TOC Transition Note (Signed)
 Transition of Care [facility]) - Discharge Note   Patient Details  Name: [name] MRN: [id number] Date of Birth: 06-14-1936  [facility] CM/SW Contact:  Jeani Mill, RN Phone Number: 12/10/2023, 1:36 PM   Clinical Narrative:    Patient stable to discharge home.  No TOC needs at this time.    Final next level of care: Home/Self Care Barriers to Discharge: Barriers Resolved   Patient Goals and CMS Choice Patient states their goals for this hospitalization and ongoing recovery are:: return home          Discharge Placement                 Home      Discharge Plan and Services Additional resources added to the After Visit Summary for                                       Social Drivers of Health (SDOH) Interventions SDOH Screenings   Food Insecurity: No Food Insecurity (12/10/2023)  Housing: Low Risk  (12/10/2023)  Transportation Needs: No Transportation Needs (12/10/2023)  Utilities: Not At Risk (12/10/2023)  Alcohol  Screen: Low Risk  (04/13/2023)  Depression (PHQ2-9): Low Risk  (11/30/2023)  Financial Resource Strain: Low Risk  (04/13/2023)  Physical Activity: Inactive (04/13/2023)  Social Connections: Socially Isolated (12/10/2023)  Stress: No Stress Concern Present (04/13/2023)  Tobacco Use: Medium Risk (11/30/2023)  Health Literacy: Adequate Health Literacy (04/13/2023)     Readmission Risk Interventions     No data to display

## 2023-12-10 NOTE — Discharge Summary (Signed)
 Physician Discharge Summary  [name] ZOX:[id number] DOB: 05/03/1936 DOA: 12/09/2023  PCP: Adelia Homestead, MD  Admit date: 12/09/2023 Discharge date: 12/10/2023  Admitted From: Home Disposition: Home  Recommendations for Outpatient Follow-up:  Follow up with PCP in 1 week with repeat CBC/BMP Outpatient follow-up with cardiology Follow up in ED if symptoms worsen or new appear   Home Health: No Equipment/Devices: None  Discharge Condition: Stable CODE STATUS: DNR Diet recommendation: Heart healthy  Brief/Interim Summary: 88 year old male with history of nonobstructive CAD, hypertension, bladder cancer, COPD, chronic low back pain and recently started on Namenda  by neurology for possible Alzheimer's dementia presented with hypotension and near syncope along with intermittent lightheadedness.  On presentation, he was hypotensive requiring IV fluids.  Antihypertensives were held.  Cardiology was consulted.  During the hospitalization, his blood pressure has improved.  He is currently not orthostatic and has tolerated PT well.  Cardiology has cleared him for discharge and apparently preliminary read of echo has looked okay as per cardiology.  Discharge patient home today with outpatient follow-up with PCP and cardiology.  Discharge Diagnoses:   Symptomatic hypotension with bradycardia and near syncope History of hypertension - Presented with hypotension, near syncope and intermittent lightheadedness.  On presentation, he was hypotensive requiring IV fluids.  Antihypertensives were held.  Cardiology was consulted.  During the hospitalization, his blood pressure has improved.  He is currently not orthostatic and has tolerated PT well.  Cardiology has cleared him for discharge and apparently preliminary read of echo has looked okay as per cardiology.  Discharge patient home today with outpatient follow-up with PCP and cardiology. - Losartan  discontinued.  Cardiology is recommending to  use Lasix  and hydralazine  only as needed -Cardiology has ordered TED hose - Will also hold tramadol , memantine  and tamsulosin  till reevaluation by PCP  Nonobstructive CAD Hyperlipidemia - Continue aspirin  and statin.  No chest pain.  Possible Alzheimer's dementia - Was recently started on memantine  as an outpatient.  Memantine  plan as above.  Outpatient follow-up with PCP and/or neurology  Chronic diastolic heart failure - Currently compensated.  Lasix  and losartan  plan as above  COPD - Stable.  On room air.  Continue home regimen  Chronic pain - Hold tramadol .  Gabapentin  dose has been decreased  Insomnia - Continue trazodone  Tremor - Continue home primidone   Obesity class I -outpatient follow-up    Discharge Instructions  Discharge Instructions     Diet - low sodium heart healthy   Complete by: As directed    Increase activity slowly   Complete by: As directed       Allergies as of 12/10/2023       Reactions   Morphine  And Codeine Other (See Comments)   Migraine    Sulfa Antibiotics Itching, Rash   Hydrochlorothiazide  Other (See Comments)   hyponatremia with daily use   Tape Itching, Other (See Comments)   Patient  Is ok to use paper tape   Hytrin [terazosin] Other (See Comments)   Dizziness   Latex Rash   Lisinopril  Cough        Medication List     STOP taking these medications    losartan  100 MG tablet Commonly known as: COZAAR    memantine  5 MG tablet Commonly known as: NAMENDA    potassium chloride  10 MEQ tablet Commonly known as: Klor-Con  10   tamsulosin  0.4 MG Caps capsule Commonly known as: FLOMAX    traMADol  50 MG tablet Commonly known as: ULTRAM   TAKE these medications    acetaminophen  650 MG CR tablet Commonly known as: TYLENOL  Take 1,300 mg by mouth at bedtime.   albuterol  108 (90 Base) MCG/ACT inhaler Commonly known as: VENTOLIN  HFA Inhale 2 puffs into the lungs every 6 (six) hours as needed for wheezing or  shortness of breath.   ammonium lactate 12 % lotion Commonly known as: LAC-HYDRIN Apply 1 Application topically as needed for dry skin.   aspirin  EC 81 MG tablet Take 81 mg by mouth in the morning. Swallow whole.   brimonidine  0.15 % ophthalmic solution Commonly known as: ALPHAGAN  Place 1 drop into both eyes 2 (two) times daily.   cetirizine  10 MG tablet Commonly known as: ZYRTEC  Take 10 mg by mouth in the morning.   clotrimazole  1 % cream Commonly known as: LOTRIMIN  Apply 1 application  topically at bedtime as needed (FOR RASH). Prescribed for rash in groin   fluticasone  50 MCG/ACT nasal spray Commonly known as: FLONASE  USE 2 SPRAYS IN EACH NOSTRIL DAILY   furosemide  20 MG tablet Commonly known as: LASIX  Take 1 tablet (20 mg total) by mouth daily as needed for edema or fluid. What changed:  how much to take how to take this when to take this reasons to take this additional instructions   gabapentin  300 MG capsule Commonly known as: NEURONTIN  Take 1 capsule (300 mg total) by mouth at bedtime. What changed:  how much to take how to take this when to take this additional instructions   hydrALAZINE  10 MG tablet Commonly known as: APRESOLINE  TAKE 1 TABLET THREE TIMES DAILY AS NEEDED FOR BLOOD PRESSURE GREATER THAN 150/90   meclizine  25 MG tablet Commonly known as: ANTIVERT  Take 25 mg by mouth in the morning and at bedtime.   nitroGLYCERIN  0.4 MG SL tablet Commonly known as: NITROSTAT  PLACE 1 TABLET UNDER THE TONGUE EVERY 5 MINUTES AS NEEDED FOR CHEST PAIN. MAX 3 DOSES. CALL 911 IF NO IMPROVEMENT AFTER 1ST DOSE What changed: See the new instructions.   omeprazole  40 MG capsule Commonly known as: PRILOSEC Take 1 capsule (40 mg total) by mouth daily. TAKE 1 CAPSULE TWICE DAILY What changed:  how much to take when to take this additional instructions   ondansetron  4 MG tablet Commonly known as: ZOFRAN  Take 1 tablet (4 mg total) by mouth as needed for nausea  or vomiting.   polyvinyl alcohol  1.4 % ophthalmic solution Commonly known as: LIQUIFILM TEARS Place 1 drop into both eyes in the morning and at bedtime.   primidone  50 MG tablet Commonly known as: MYSOLINE  Take 50 mg by mouth in the morning.   simvastatin  40 MG tablet Commonly known as: ZOCOR  Take 1 tablet (40 mg total) by mouth daily at 6 PM. What changed: when to take this   Spiriva Respimat 2.5 MCG/ACT Aers Generic drug: Tiotropium Bromide Monohydrate Inhale 2.5 mcg into the lungs in the morning.   timolol  0.5 % ophthalmic solution Commonly known as: TIMOPTIC  Place 1 drop into both eyes 2 (two) times daily.   traZODone 50 MG tablet Commonly known as: DESYREL Take 50 mg by mouth at bedtime.   vitamin B-12 500 MCG tablet Commonly known as: CYANOCOBALAMIN  Take 500 mcg by mouth in the morning.          Follow-up Information     Adelia Homestead, MD. Schedule an appointment as soon as possible for a visit in 1 week(s).   Specialty: Internal Medicine Contact information: [address] [place]  [address] [phone]                Allergies  Allergen Reactions   Morphine  And Codeine Other (See Comments)    Migraine    Sulfa Antibiotics Itching and Rash   Hydrochlorothiazide  Other (See Comments)    hyponatremia with daily use   Tape Itching and Other (See Comments)    Patient  Is ok to use paper tape   Hytrin [Terazosin] Other (See Comments)    Dizziness   Latex Rash   Lisinopril  Cough    Consultations: Cardiology   Procedures/Studies: DG Chest 2 View Result Date: 12/09/2023 CLINICAL DATA:  Shortness of breath EXAM: CHEST - 2 VIEW COMPARISON:  Chest x-ray 10/24/2020 skip FINDINGS: The heart size and mediastinal contours are within normal limits. Both lungs are clear. The visualized skeletal structures are unremarkable. Thoracic spinal cord stimulator device is present. IMPRESSION: No active cardiopulmonary disease. Electronically  Signed   By: Tyron Gallon M.D.   On: 12/09/2023 14:49      Subjective: Patient seen and examined at bedside.  Feels better.  Feels okay to go home today.  Denies any current chest pain, shortness of breath, fever or vomiting.  Discharge Exam: Vitals:   12/10/23 0812 12/10/23 0926  BP: 120/66   Pulse: 69   Resp: 18   Temp: 98.4 F (36.9 C)   SpO2: 95% 96%    General: Pt is alert, awake, not in acute distress.  Elderly male lying in bed.  On room air. Cardiovascular: rate controlled, S1/S2 + Respiratory: bilateral decreased breath sounds at bases Abdominal: Soft, NT, ND, bowel sounds + Extremities: Trace lower extremity edema, no cyanosis    The results of significant diagnostics from this hospitalization (including imaging, microbiology, ancillary and laboratory) are listed below for reference.     Microbiology: Recent Results (from the past 240 hours)  Resp panel by RT-PCR (RSV, Flu A&B, Covid) Anterior Nasal Swab     Status: None   Collection Time: 12/09/23 11:55 AM   Specimen: Anterior Nasal Swab  Result Value Ref Range Status   SARS Coronavirus 2 by RT PCR NEGATIVE NEGATIVE Final   Influenza A by PCR NEGATIVE NEGATIVE Final   Influenza B by PCR NEGATIVE NEGATIVE Final    Comment: (NOTE) The Xpert Xpress SARS-CoV-2/FLU/RSV plus assay is intended as an aid in the diagnosis of influenza from Nasopharyngeal swab specimens and should not be used as a sole basis for treatment. Nasal washings and aspirates are unacceptable for Xpert Xpress SARS-CoV-2/FLU/RSV testing.  Fact Sheet for Patients: BloggerCourse.com  Fact Sheet for Healthcare Providers: SeriousBroker.it  This test is not yet approved or cleared by the [place]  FDA and has been authorized for detection and/or diagnosis of SARS-CoV-2 by FDA under an Emergency Use Authorization (EUA). This EUA will remain in effect (meaning this test can be used) for the  duration of the COVID-19 declaration under Section 564(b)(1) of the Act, 21 U.S.C. section 360bbb-3(b)(1), unless the authorization is terminated or revoked.     Resp Syncytial Virus by PCR NEGATIVE NEGATIVE Final    Comment: (NOTE) Fact Sheet for Patients: BloggerCourse.com  Fact Sheet for Healthcare Providers: SeriousBroker.it  This test is not yet approved or cleared by the [place]  FDA and has been authorized for detection and/or diagnosis of SARS-CoV-2 by FDA under an Emergency Use Authorization (EUA). This EUA will remain in effect (meaning this test can be used) for the duration of the COVID-19 declaration under Section 564(b)(1) of  the Act, 21 U.S.C. section 360bbb-3(b)(1), unless the authorization is terminated or revoked.  Performed at [facility], [address], [place], [place] [address]      Labs: BNP (last 3 results) Recent Labs    12/09/23 1154  BNP 101.5*   Basic Metabolic Panel: Recent Labs  Lab 12/09/23 1154 12/10/23 0846  NA 139 140  K 3.8 3.8  CL 106 106  CO2 20* 23  GLUCOSE 113* 102*  BUN 11 9  CREATININE 1.09 0.95  CALCIUM  9.5 9.2   Liver Function Tests: Recent Labs  Lab 12/09/23 1154  AST 22  ALT 14  ALKPHOS 71  BILITOT 0.7  PROT 6.3*  ALBUMIN  3.9   No results for input(s): "LIPASE", "AMYLASE" in the last 168 hours. No results for input(s): "AMMONIA" in the last 168 hours. CBC: Recent Labs  Lab 12/09/23 1154  WBC 8.7  NEUTROABS 6.8  HGB 13.2  HCT 39.4  MCV 85.5  PLT 201   Cardiac Enzymes: Recent Labs  Lab 12/09/23 1354  CKTOTAL 57   BNP: Invalid input(s): "POCBNP" CBG: No results for input(s): "GLUCAP" in the last 168 hours. D-Dimer Recent Labs    12/09/23 2009  DDIMER <0.27   Hgb A1c No results for input(s): "HGBA1C" in the last 72 hours. Lipid Profile No results for input(s): "CHOL", "HDL", "LDLCALC", "TRIG", "CHOLHDL", "LDLDIRECT" in the  last 72 hours. Thyroid  function studies Recent Labs    12/10/23 0846  TSH 2.087   Anemia work up No results for input(s): "VITAMINB12", "FOLATE", "FERRITIN", "TIBC", "IRON", "RETICCTPCT" in the last 72 hours. Urinalysis    Component Value Date/Time   COLORURINE YELLOW 09/17/2022 1153   APPEARANCEUR CLEAR 09/17/2022 1153   LABSPEC 1.020 09/17/2022 1153   PHURINE 6.0 09/17/2022 1153   GLUCOSEU NEGATIVE 09/17/2022 1153   HGBUR NEGATIVE 09/17/2022 1153   BILIRUBINUR NEGATIVE 09/17/2022 1153   KETONESUR NEGATIVE 09/17/2022 1153   PROTEINUR 30 (A) 01/18/2020 2322   UROBILINOGEN 0.2 09/17/2022 1153   NITRITE NEGATIVE 09/17/2022 1153   LEUKOCYTESUR NEGATIVE 09/17/2022 1153   Sepsis Labs Recent Labs  Lab 12/09/23 1154  WBC 8.7   Microbiology Recent Results (from the past 240 hours)  Resp panel by RT-PCR (RSV, Flu A&B, Covid) Anterior Nasal Swab     Status: None   Collection Time: 12/09/23 11:55 AM   Specimen: Anterior Nasal Swab  Result Value Ref Range Status   SARS Coronavirus 2 by RT PCR NEGATIVE NEGATIVE Final   Influenza A by PCR NEGATIVE NEGATIVE Final   Influenza B by PCR NEGATIVE NEGATIVE Final    Comment: (NOTE) The Xpert Xpress SARS-CoV-2/FLU/RSV plus assay is intended as an aid in the diagnosis of influenza from Nasopharyngeal swab specimens and should not be used as a sole basis for treatment. Nasal washings and aspirates are unacceptable for Xpert Xpress SARS-CoV-2/FLU/RSV testing.  Fact Sheet for Patients: BloggerCourse.com  Fact Sheet for Healthcare Providers: SeriousBroker.it  This test is not yet approved or cleared by the [place]  FDA and has been authorized for detection and/or diagnosis of SARS-CoV-2 by FDA under an Emergency Use Authorization (EUA). This EUA will remain in effect (meaning this test can be used) for the duration of the COVID-19 declaration under Section 564(b)(1) of the Act, 21  U.S.C. section 360bbb-3(b)(1), unless the authorization is terminated or revoked.     Resp Syncytial Virus by PCR NEGATIVE NEGATIVE Final    Comment: (NOTE) Fact Sheet for Patients: BloggerCourse.com  Fact Sheet for Healthcare  Providers: SeriousBroker.it  This test is not yet approved or cleared by the [place]  FDA and has been authorized for detection and/or diagnosis of SARS-CoV-2 by FDA under an Emergency Use Authorization (EUA). This EUA will remain in effect (meaning this test can be used) for the duration of the COVID-19 declaration under Section 564(b)(1) of the Act, 21 U.S.C. section 360bbb-3(b)(1), unless the authorization is terminated or revoked.  Performed at [facility], [address] [place], [place] [address]      Time coordinating discharge: 35 minutes  SIGNED:   Audria Leather, MD  [facility] 12/10/2023, 11:05 AM

## 2023-12-10 NOTE — Progress Notes (Signed)
 Carotid artery duplex has been completed. Preliminary results can be found in CV Proc through chart review.   12/10/23 11:42 AM Birda Buffy RVT

## 2023-12-10 NOTE — Progress Notes (Signed)
  Echocardiogram 2D Echocardiogram has been performed.  Oslo Huntsman L Erving Sassano RDCS 12/10/2023, 9:15 AM

## 2023-12-10 NOTE — Evaluation (Addendum)
 Physical Therapy Evaluation Patient Details Name: [name] MRN: [id number] DOB: 21-Jul-1935 Today's Date: 12/10/2023  History of Present Illness  Patient is an 88 y/o male admitted 12/09/23 with hypotension, near syncope with SBP in 50's at home.  PMH positive for bladder CA, BPH, bradycardia, CAD, CHF, CKD, COPD, DDD, GERD, glaucoma, RLS, seizures, TIA, vertigo, multiple lumbar surgeries, [place] stimulator.  Clinical Impression  Patient presents with mobility not far from baseline, though initial with LE buckling in standing.  Orthostatic VS negative.  Patient without c/o symptoms.  Typically independent with rollator at home and then using scooter at times outside.  Feel he will benefit from skilled PT in the acute setting to ensure LE strength adequate for safe transitions and no further symptoms.  No follow up PT needs at this time.    Orthostatic VS for the past 24 hrs (Last 3 readings):  BP- Lying BP- Sitting Pulse- Sitting BP- Standing at 0 minutes Pulse- Standing at 0 minutes BP- Standing at 3 minutes Pulse- Standing at 3 minutes  12/10/23 1000 139/73 135/76 61 129/70 74 134/68 67         If plan is discharge home, recommend the following: Help with stairs or ramp for entrance;Assist for transportation;Direct supervision/assist for medications management   Can travel by private vehicle        Equipment Recommendations None recommended by PT  Recommendations for Other Services       Functional Status Assessment Patient has had a recent decline in their functional status and demonstrates the ability to make significant improvements in function in a reasonable and predictable amount of time.     Precautions / Restrictions Precautions Precautions: Fall Precaution/Restrictions Comments: reports fell last week over walker      Mobility  Bed Mobility Overal bed mobility: Modified Independent                  Transfers Overall transfer level: Needs assistance Equipment  used: Rollator (4 wheels) Transfers: Sit to/from Stand Sit to Stand: Supervision, Contact guard assist           General transfer comment: initial leg weakness with buckling, CGA for safety    Ambulation/Gait Ambulation/Gait assistance: Contact guard assist, Supervision Gait Distance (Feet): 120 Feet Assistive device: Rollator (4 wheels) Gait Pattern/deviations: Step-through pattern, Trunk flexed, Decreased stride length       General Gait Details: CGA initially, progressed to S no episodes of knee buckling with ambulation though was initial with sit to stand  Stairs            Wheelchair Mobility     Tilt Bed    Modified Rankin (Stroke Patients Only)       Balance Overall balance assessment: Needs assistance   Sitting balance-Leahy Scale: Good     Standing balance support: Bilateral upper extremity supported, No upper extremity supported Standing balance-Leahy Scale: Fair Standing balance comment: able to wash hands at sink without UE support                             Pertinent Vitals/Pain Pain Assessment Pain Assessment: Faces Faces Pain Scale: Hurts a little bit Pain Location: chronic back pain Pain Descriptors / Indicators: Aching Pain Intervention(s): Monitored during session, Repositioned    Home Living Family/patient expects to be discharged to:: Private residence Living Arrangements: Children Available Help at Discharge: Family;Available 24 hours/day Type of Home: House Home Access: Ramped entrance  Home Layout: One level Home Equipment: Grab bars - tub/shower;Shower seat - built Designer, television/film set (4 wheels) Additional Comments: lives with son and daughter in law    Prior Function Prior Level of Function : Needs assist               ADLs Comments: help for meds and meals     Extremity/Trunk Assessment   Upper Extremity Assessment Upper Extremity Assessment: Overall WFL for tasks assessed     Lower Extremity Assessment Lower Extremity Assessment: Generalized weakness    Cervical / Trunk Assessment Cervical / Trunk Assessment: Lordotic  Communication   Communication Communication: Impaired Factors Affecting Communication: Hearing impaired    Cognition Arousal: Alert Behavior During Therapy: WFL for tasks assessed/performed   PT - Cognitive impairments: History of cognitive impairments                       PT - Cognition Comments: reports history of dementia Following commands: Intact       Cueing Cueing Techniques: Verbal cues     General Comments General comments (skin integrity, edema, etc.): BP measurements WNL with position changes, did c/o dizziness after seated in chair (briefly)    Exercises     Assessment/Plan    PT Assessment Patient needs continued PT services  PT Problem List Decreased mobility;Cardiopulmonary status limiting activity;Decreased activity tolerance;Decreased strength       PT Treatment Interventions DME instruction;Gait training;Functional mobility training;Therapeutic activities;Therapeutic exercise;Balance training    PT Goals (Current goals can be found in the Care Plan section)  Acute Rehab PT Goals Patient Stated Goal: return to independent PT Goal Formulation: With patient Time For Goal Achievement: 12/24/23 Potential to Achieve Goals: Good    Frequency Min 2X/week     Co-evaluation               AM-PAC PT "6 Clicks" Mobility  Outcome Measure Help needed turning from your back to your side while in a flat bed without using bedrails?: None Help needed moving from lying on your back to sitting on the side of a flat bed without using bedrails?: None Help needed moving to and from a bed to a chair (including a wheelchair)?: None Help needed standing up from a chair using your arms (e.g., wheelchair or bedside chair)?: None Help needed to walk in hospital room?: A Little Help needed climbing 3-5  steps with a railing? : A Little 6 Click Score: 22    End of Session Equipment Utilized During Treatment: Gait belt Activity Tolerance: Patient tolerated treatment well Patient left: in chair;with call bell/phone within reach;with bed alarm set   PT Visit Diagnosis: Muscle weakness (generalized) (M62.81)    Time: 1610-9604 PT Time Calculation (min) (ACUTE ONLY): 35 min   Charges:   PT Evaluation $PT Eval Moderate Complexity: 1 Mod PT Treatments $Gait Training: 8-22 mins PT General Charges $$ ACUTE PT VISIT: 1 Visit         Abigail Hoff, PT Acute Rehabilitation Services Office:[phone] 12/10/2023   Marley Simmers 12/10/2023, 10:12 AM

## 2023-12-11 ENCOUNTER — Ambulatory Visit: Payer: Self-pay | Admitting: Internal Medicine

## 2023-12-22 ENCOUNTER — Ambulatory Visit (INDEPENDENT_AMBULATORY_CARE_PROVIDER_SITE_OTHER): Admitting: Internal Medicine

## 2023-12-22 ENCOUNTER — Encounter: Payer: Self-pay | Admitting: Internal Medicine

## 2023-12-22 VITALS — BP 122/68 | HR 51 | Temp 97.9°F | Ht 63.0 in

## 2023-12-22 DIAGNOSIS — I10 Essential (primary) hypertension: Secondary | ICD-10-CM

## 2023-12-22 DIAGNOSIS — J449 Chronic obstructive pulmonary disease, unspecified: Secondary | ICD-10-CM | POA: Diagnosis not present

## 2023-12-22 DIAGNOSIS — I5032 Chronic diastolic (congestive) heart failure: Secondary | ICD-10-CM

## 2023-12-22 NOTE — Progress Notes (Signed)
   Subjective:   Patient ID: [name], male    DOB: 10-Dec-1935, 88 y.o.   MRN: [id number]  HPI The patient is an 88 YO man coming in for hospital follow up (near syncope with low BP and regimen adjusted for BP). He is now taking lasix  only 3 times a week and hydralazine  prn only. BP is doing okay more 130-140s/70-80s. Denies syncope but and less dizzy than prior. No falls. Appetite is low but stable. Sleeping okay.No chest pains or change in SOB. No diarrhea or constipation.  PMH, [facility], social history reviewed and updated  Review of Systems  Constitutional:  Positive for activity change, appetite change and fatigue.  HENT: Negative.    Eyes: Negative.   Respiratory:  Negative for cough, chest tightness and shortness of breath.   Cardiovascular:  Negative for chest pain, palpitations and leg swelling.  Gastrointestinal:  Negative for abdominal distention, abdominal pain, constipation, diarrhea, nausea and vomiting.  Musculoskeletal: Negative.   Skin: Negative.   Neurological: Negative.   Psychiatric/Behavioral: Negative.      Objective:  Physical Exam Constitutional:      Appearance: He is well-developed.  HENT:     Head: Normocephalic and atraumatic.   Cardiovascular:     Rate and Rhythm: Normal rate and regular rhythm.  Pulmonary:     Effort: Pulmonary effort is normal. No respiratory distress.     Breath sounds: Normal breath sounds. No wheezing or rales.  Abdominal:     General: Bowel sounds are normal. There is no distension.     Palpations: Abdomen is soft.     Tenderness: There is no abdominal tenderness. There is no rebound.   Musculoskeletal:     Cervical back: Normal range of motion.   Skin:    General: Skin is warm and dry.   Neurological:     Mental Status: He is alert and oriented to person, place, and time.     Coordination: Coordination normal.     Vitals:   12/22/23 1048  BP: 122/68  Pulse: (!) 51  Temp: 97.9 F (36.6 C)  SpO2: 98%  Height: 5'  3 (1.6 m)    Assessment & Plan:  Visit time 25 minutes in face to face communication with patient and coordination of care, additional 15 minutes spent in record review, coordination or care, ordering tests, communicating/referring to other healthcare professionals, documenting in medical records all on the same day of the visit for total time 40 minutes spent on the visit.

## 2023-12-24 ENCOUNTER — Other Ambulatory Visit

## 2023-12-24 ENCOUNTER — Encounter: Payer: Self-pay | Admitting: Physician Assistant

## 2023-12-24 ENCOUNTER — Ambulatory Visit (INDEPENDENT_AMBULATORY_CARE_PROVIDER_SITE_OTHER): Admitting: Physician Assistant

## 2023-12-24 VITALS — BP 110/67 | HR 61 | Resp 20 | Ht 66.0 in | Wt 173.0 lb

## 2023-12-24 DIAGNOSIS — R413 Other amnesia: Secondary | ICD-10-CM | POA: Diagnosis not present

## 2023-12-24 NOTE — Progress Notes (Signed)
 Assessment/Plan:   Mild Cognitive Impairment of unclear etiology, concern for vascular disease   [name] is a very pleasant 88 y.o. RH male with a history of  hypertension, hyperlipidemia COPD, rheumatoid arthritis, CHF, remoteTIA, essential tremor of the right hand, BPH, CKD, carotid artery stenosis, documented history of bradycardia with near syncopal episode 12/10/2023, DM2., HOH, prediabetes seen today in follow up to discuss the CT head results performed on  /2025 . These were remarkable for atrophy, chronic microvascular changes, generalized volume loss.  No acute findings were noted.. Patient is accompanied in the office by his son with his wife. Patient was first seen on  10/2023 with a MMSE of 17/30 (unable to do MoCA due to reading limitations)..   Patient is currently on memantine  5 mg bid tolerating well although had been placed on hold due to recent hospitalization.  It is felt prudent to resume memantine  in view of the recent findings and performance.  We discussed blood biomarkers to rule out the probability of Alzheimer's disease, and family wishes to proceed..   Follow up in 6 months. Plasma biomarkers to rule out the risk for Alzheimer's disease Proceed to memantine  5 mg twice daily, side effects discussed  Recommend good control of cardiovascular risk factors Monitor for possible OH.  Follow-up with cardiology Continue to control mood as per PCP   Initial visit 10/16/2023 How long did patient have memory difficulties?  For about more than 6 years, may be more little things-son says.  His main difficulties with short-term memory although they may be instances in which his LTM may be affected.   repeats oneself?  Endorsed. Disoriented when walking into a room?  Patient denies    Leaving objects in unusual places?  Endorsed, including today, when he could not find his phone.  However, objects have not found in unusual places  Wandering behavior? Denies.  His son has placed  a camera on the porch for extra security which is attached to the son's cell phone  Any personality changes, or depression, anxiety? Denies. He  may some grouchy moments.   Hallucinations or paranoia? A couple of times he mentioned to his daughter that he was talking to his dead wife.  Seizures? Denies.    Any sleep changes?  Sleeps well on and off. Takes  1THC gummie twice at night, given by his pain medicine doctor with some relief.  This was given by his pain provider.  Denies vivid dreams, denies REM behavior or sleepwalking.  However in the past, he has been evaluated for sleep apnea and RLS, referred to sleep studies, as he denies having a formal diagnosis.   Any hygiene concerns?  Denies.   Independent of bathing and dressing? Endorsed  Does the patient need help with medications? Daughter in law is in charge, uses a pillbox.  It is not clear if the patient may take more or less medication than what is prescribed. Who is in charge of the finances? Son and his wife in charge     Any changes in appetite?  Decreased, because he wakes up late .   Patient have trouble swallowing?  Denies.   Does the patient cook? No  Any headaches?  Denies.   Chronic pain? Endorsed, has a stimulator, and receives back injections (Dr. Rexanne Catalina) and he is on gabapentin .  He also has chronic knee pain, follows orthopedics Ambulates with difficulty? Denies  Needs a walker  to ambulate for stability.  Denies any shuffling.  Walks slowly due to knee pain. Recent falls or head injuries? Denies.     Vision changes?  Denies any new issues. Has a history of glaucoma Any strokelike symptoms? Denies.  She has a history of dizziness, for many years, but is not described as vertigo, when I get up quickly, blood pressure changes . Any tremors? R hand essential tremor followed at [facility] by neurology, stable, he takes primidone .  Denies rest tremors. Any anosmia? Denies.   Any incontinence of urine? Has occasional  recurrent UTI, history of bladder cancer, currently stable Any bowel dysfunction? Denies.      Patient lives with his son History of heavy alcohol  intake? Denies.   History of heavy tobacco use? Denies.   Family history of dementia?  2  Sisters switch dementia  Does patient drive?Quit driving 5 years ago because my reaction time was off.      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  CURRENT MEDICATIONS:  Outpatient Encounter Medications as of 12/24/2023  Medication Sig   acetaminophen  (TYLENOL ) 650 MG CR tablet Take 1,300 mg by mouth at bedtime.   albuterol  (VENTOLIN  HFA) 108 (90 Base) MCG/ACT inhaler Inhale 2 puffs into the lungs every 6 (six) hours as needed for wheezing or shortness of breath.   ammonium lactate (LAC-HYDRIN) 12 % lotion Apply 1 Application topically as needed for dry skin.   aspirin  EC 81 MG tablet Take 81 mg by mouth in the morning. Swallow whole.   brimonidine  (ALPHAGAN ) 0.15 % ophthalmic solution Place 1 drop into both eyes 2 (two) times daily.   cetirizine  (ZYRTEC ) 10 MG tablet Take 10 mg by mouth in the morning.   clotrimazole  (LOTRIMIN ) 1 % cream Apply 1 application  topically at bedtime as needed (FOR RASH). Prescribed for rash in groin   fluticasone  (FLONASE ) 50 MCG/ACT nasal spray USE 2 SPRAYS IN EACH NOSTRIL DAILY (Patient taking differently: Place 2 sprays into both nostrils daily.)   furosemide  (LASIX ) 20 MG tablet Take 1 tablet (20 mg total) by mouth daily as needed for edema or fluid.   gabapentin  (NEURONTIN ) 300 MG capsule Take 1 capsule (300 mg total) by mouth at bedtime.   hydrALAZINE  (APRESOLINE ) 10 MG tablet TAKE 1 TABLET THREE TIMES DAILY AS NEEDED FOR BLOOD  PRESSURE GREATER THAN 150/90   meclizine  (ANTIVERT ) 25 MG tablet Take 25 mg by mouth in the morning and at bedtime.   nitroGLYCERIN  (NITROSTAT ) 0.4 MG SL tablet PLACE 1 TABLET UNDER THE TONGUE EVERY 5 MINUTES AS NEEDED FOR CHEST PAIN. MAX 3 DOSES. CALL 911 IF NO IMPROVEMENT AFTER 1ST DOSE (Patient taking differently: Place 0.4 mg under the tongue every 5 (five) minutes as needed for chest pain.)   omeprazole  (PRILOSEC) 40 MG capsule Take 1 capsule (40 mg total) by mouth daily. TAKE 1 CAPSULE TWICE DAILY (Patient taking differently: Take 20 mg by mouth in the morning and at bedtime.)   ondansetron  (ZOFRAN ) 4 MG tablet Take 1 tablet (4 mg total) by mouth as needed for nausea or vomiting.   polyvinyl alcohol  (LIQUIFILM TEARS) 1.4 % ophthalmic solution Place 1 drop into both eyes in the morning and at bedtime.   primidone  (MYSOLINE ) 50 MG tablet Take 50 mg by mouth in the  morning.   simvastatin  (ZOCOR ) 40 MG tablet TAKE 1 AND 1/2 TABLETS AT BEDTIME   timolol  (TIMOPTIC ) 0.5 % ophthalmic solution Place 1 drop into both eyes 2 (two) times daily.    Tiotropium Bromide  Monohydrate (SPIRIVA  RESPIMAT) 2.5 MCG/ACT AERS Inhale 2.5 mcg into the lungs in the morning.   traZODone  (DESYREL ) 50 MG tablet Take 50 mg by mouth at bedtime.   vitamin B-12 (CYANOCOBALAMIN ) 500 MCG tablet Take 500 mcg by mouth in the morning.   [DISCONTINUED] Insulin  Detemir (LEVEMIR ) 100 UNIT/ML Pen Inject 5 Units into the skin 2 (two) times daily. (Patient not taking: Reported on 01/19/2020)   [DISCONTINUED] ipratropium-albuterol  (DUONEB) 0.5-2.5 (3) MG/3ML SOLN Take 3 mLs by nebulization every 6 (six) hours as needed. (Patient not taking: Reported on 01/19/2020)   No facility-administered encounter medications on file as of 12/24/2023.       10/16/2023    4:00 PM 12/14/2017    9:47 AM  MMSE - Mini Mental State Exam  Orientation to time 4 5  Orientation to Place 3 5  Registration 3 3  Attention/ Calculation 0 3  Recall 1 3  Language-  name 2 objects 2 2  Language- repeat 1 1  Language- follow 3 step command 2 3  Language- read & follow direction 1 1  Write a sentence 0 1  Copy design 0 1  Total score 17 28       No data to display         Thank you for allowing us  the opportunity to participate in the care of this nice patient. Please do not hesitate to contact us  for any questions or concerns.   Total time spent on today's visit was 32 minutes dedicated to this patient today, preparing to see patient, examining the patient, ordering tests and/or medications and counseling the patient, documenting clinical information in the EHR or other health record, independently interpreting results and communicating results to the patient/family, discussing treatment and goals, answering patient's questions and coordinating care.  Cc:  Adelia Homestead, MD  Tex Filbert 12/24/2023 6:48 AM

## 2023-12-24 NOTE — Patient Instructions (Addendum)
 It was a pleasure to see you today at our office.   Recommendations:  Dec 31 at 11:30 Check labs today  [address] Resume memantine  5 mg  twice a day   For psychiatric meds, mood meds: Please have your primary care physician manage these medications.  If you have any severe symptoms of a stroke, or other severe issues such as confusion,severe chills or fever, etc call 911 or go to the ER as you may need to be evaluated further   For guidance regarding [facility] and if placement were needed at the facility, contact Social Worker tel: [phone]   For assessment of decision of mental capacity and competency:  Call Dr. Laverne Potter, geriatric psychiatrist at [phone]  Counseling regarding caregiver distress, including caregiver depression, anxiety and issues regarding community resources, adult day care programs, adult living facilities, or memory care questions:  please contact your  Primary Doctor's Social Worker   Whom to call: Memory  decline, memory medications: Call our office [phone]    [email or web address]   RECOMMENDATIONS FOR ALL PATIENTS WITH MEMORY PROBLEMS: 1. Continue to exercise (Recommend 30 minutes of walking everyday, or 3 hours every week) 2. Increase social interactions - continue going to [employer] and enjoy social gatherings with friends and family 3. Eat healthy, avoid fried foods and eat more fruits and vegetables 4. Maintain adequate blood pressure, blood sugar, and blood cholesterol level. Reducing the risk of stroke and cardiovascular disease also helps promoting better memory. 5. Avoid stressful situations. Live a simple life and avoid aggravations. Organize your time and prepare for the next day in anticipation. 6. Sleep well, avoid any interruptions of sleep and avoid any distractions in the bedroom that may interfere with adequate sleep quality 7. Avoid sugar, avoid sweets as  there is a strong link between excessive sugar intake, diabetes, and cognitive impairment We discussed the Mediterranean diet, which has been shown to help patients reduce the risk of progressive memory disorders and reduces cardiovascular risk. This includes eating fish, eat fruits and green leafy vegetables, nuts like almonds and hazelnuts, walnuts, and also use olive oil. Avoid fast foods and fried foods as much as possible. Avoid sweets and sugar as sugar use has been linked to worsening of memory function.  There is always a concern of gradual progression of memory problems. If this is the case, then we may need to adjust level of care according to patient needs. Support, both to the patient and caregiver, should then be put into place.         DRIVING: Regarding driving, in patients with progressive memory problems, driving will be impaired. We advise to have someone else do the driving if trouble finding directions or if minor accidents are reported. Independent driving assessment is available to determine safety of driving.   If you are interested in the driving assessment, you can contact the following:  [employer] in [place] [phone]  [facility] [phone]  [facility] [phone]  [facility] [phone] or [phone]   FALL PRECAUTIONS: Be cautious when walking. Scan the area for obstacles that may increase the risk of trips and falls. When getting up in the mornings, sit up at the edge of the bed for a few minutes before getting out of bed. Consider elevating the bed at the head end to avoid drop of blood pressure when getting up. Walk always in a well-lit room (use night lights in the walls). Avoid area rugs or  power cords from appliances in the middle of the walkways. Use a walker or a cane if necessary and consider physical therapy for balance exercise. Get your eyesight checked regularly.  FINANCIAL OVERSIGHT:  Supervision, especially oversight when making financial decisions or transactions is also recommended.  HOME SAFETY: Consider the safety of the kitchen when operating appliances like stoves, microwave oven, and blender. Consider having supervision and share cooking responsibilities until no longer able to participate in those. Accidents with firearms and other hazards in the house should be identified and addressed as well.   ABILITY TO BE LEFT ALONE: If patient is unable to contact 911 operator, consider using Life[employer], or when the need is there, arrange for someone to stay with patients. Smoking is a fire hazard, consider supervision or cessation. Risk of wandering should be assessed by caregiver and if detected at any point, supervision and safe proof recommendations should be instituted.  MEDICATION SUPERVISION: Inability to self-administer medication needs to be constantly addressed. Implement a mechanism to ensure safe administration of the medications.      Mediterranean Diet A Mediterranean diet refers to food and lifestyle choices that are based on the traditions of countries located on the [employer] [place]. This way of eating has been shown to help prevent certain conditions and improve outcomes for people who have chronic diseases, like kidney disease and heart disease. What are tips for following this plan? Lifestyle  Cook and eat meals together with your family, when possible. Drink enough fluid to keep your urine clear or pale yellow. Be physically active every day. This includes: Aerobic exercise like running or swimming. Leisure activities like gardening, walking, or housework. Get 7-8 hours of sleep each night. If recommended by your health care provider, drink red wine in moderation. This means 1 glass a day for nonpregnant women and 2 glasses a day for men. A glass of wine equals 5 oz (150 mL). Reading food labels  Check the serving size of packaged foods. For foods such  as rice and pasta, the serving size refers to the amount of cooked product, not dry. Check the total fat in packaged foods. Avoid foods that have saturated fat or trans fats. Check the ingredients list for added sugars, such as corn syrup. Shopping  At the grocery store, buy most of your food from the areas near the walls of the store. This includes: Fresh fruits and vegetables (produce). Grains, beans, nuts, and seeds. Some of these may be available in unpackaged forms or large amounts (in bulk). Fresh seafood. Poultry and eggs. Low-fat dairy products. Buy whole ingredients instead of prepackaged foods. Buy fresh fruits and vegetables in-season from local farmers markets. Buy frozen fruits and vegetables in resealable bags. If you do not have access to quality fresh seafood, buy precooked frozen shrimp or canned fish, such as tuna, salmon, or sardines. Buy small amounts of raw or cooked vegetables, salads, or olives from the deli or salad bar at your store. Stock your pantry so you always have certain foods on hand, such as olive oil, canned tuna, canned tomatoes, rice, pasta, and beans. Cooking  Cook foods with extra-virgin olive oil instead of using butter or other vegetable oils. Have meat as a side dish, and have vegetables or grains as your main dish. This means having meat in small portions or adding small amounts of meat to foods like pasta or stew. Use beans or vegetables instead of meat in common dishes like chili or lasagna. Experiment with different cooking  methods. Try roasting or broiling vegetables instead of steaming or sauteing them. Add frozen vegetables to soups, stews, pasta, or rice. Add nuts or seeds for added healthy fat at each meal. You can add these to yogurt, salads, or vegetable dishes. Marinate fish or vegetables using olive oil, lemon juice, garlic, and fresh herbs. Meal planning  Plan to eat 1 vegetarian meal one day each week. Try to work up to 2 vegetarian  meals, if possible. Eat seafood 2 or more times a week. Have healthy snacks readily available, such as: Vegetable sticks with hummus. Greek yogurt. Fruit and nut trail mix. Eat balanced meals throughout the week. This includes: Fruit: 2-3 servings a day Vegetables: 4-5 servings a day Low-fat dairy: 2 servings a day Fish, poultry, or lean meat: 1 serving a day Beans and legumes: 2 or more servings a week Nuts and seeds: 1-2 servings a day Whole grains: 6-8 servings a day Extra-virgin olive oil: 3-4 servings a day Limit red meat and sweets to only a few servings a month What are my food choices? Mediterranean diet Recommended Grains: Whole-grain pasta. Brown rice. Bulgar wheat. Polenta. Couscous. Whole-wheat bread. Dwyane Glad. Vegetables: Artichokes. Beets. Broccoli. Cabbage. Carrots. Eggplant. Green beans. Chard. Kale. Spinach. Onions. Leeks. Peas. Squash. Tomatoes. Peppers. Radishes. Fruits: Apples. Apricots. Avocado. Berries. Bananas. Cherries. Dates. Figs. Grapes. Lemons. Melon. Oranges. Peaches. Plums. Pomegranate. Meats and other protein foods: Beans. Almonds. Sunflower seeds. Pine nuts. Peanuts. Cod. Salmon. Scallops. Shrimp. Tuna. Tilapia. Clams. Oysters. Eggs. Dairy: Low-fat milk. Cheese. Greek yogurt. Beverages: Water . Red wine. Herbal tea. Fats and oils: Extra virgin olive oil. Avocado oil. Grape seed oil. Sweets and desserts: [place] yogurt with honey. Baked apples. Poached pears. Trail mix. Seasoning and other foods: Basil. Cilantro. Coriander. Cumin. Mint. Parsley. Sage. Rosemary. Tarragon. Garlic. Oregano. Thyme. Pepper. Balsalmic vinegar. Tahini. Hummus. Tomato sauce. Olives. Mushrooms. Limit these Grains: Prepackaged pasta or rice dishes. Prepackaged cereal with added sugar. Vegetables: Deep fried potatoes (french fries). Fruits: Fruit canned in syrup. Meats and other protein foods: Beef. Pork. Lamb. Poultry with skin. Hot dogs. Helene Loader. Dairy: Ice cream. Sour cream.  Whole milk. Beverages: Juice. Sugar-sweetened soft drinks. Beer. Liquor and spirits. Fats and oils: Butter. Canola oil. Vegetable oil. Beef fat (tallow). Lard. Sweets and desserts: Cookies. Cakes. Pies. Candy. Seasoning and other foods: Mayonnaise. Premade sauces and marinades. The items listed may not be a complete list. Talk with your dietitian about what dietary choices are right for you. Summary The Mediterranean diet includes both food and lifestyle choices. Eat a variety of fresh fruits and vegetables, beans, nuts, seeds, and whole grains. Limit the amount of red meat and sweets that you eat. Talk with your health care provider about whether it is safe for you to drink red wine in moderation. This means 1 glass a day for nonpregnant women and 2 glasses a day for men. A glass of wine equals 5 oz (150 mL). This information is not intended to replace advice given to you by your health care provider. Make sure you discuss any questions you have with your health care provider. Document Released: 02/21/2016 Document Revised: 03/25/2016 Document Reviewed: 02/21/2016 Elsevier Interactive Patient Education  2017 [employer].

## 2023-12-25 ENCOUNTER — Encounter: Payer: Self-pay | Admitting: Internal Medicine

## 2023-12-25 NOTE — Assessment & Plan Note (Signed)
 No flare today, watching sodium and weights at home. Taking lasix  3 times a week.

## 2023-12-25 NOTE — Assessment & Plan Note (Signed)
 BP normal today and average is 130-140s at home over 70s mostly. We discussed goal of 150/90 for him given pre-syncope and risk of falls.

## 2023-12-25 NOTE — Assessment & Plan Note (Signed)
 Stable SOB on exertion no flare. Has access to his rescue inhaler.

## 2023-12-27 NOTE — Progress Notes (Unsigned)
 Cardiology Office Note:   Date:  12/28/2023  ID:  [name], DOB Aug 14, 1935, MRN [id number] PCP: Adelia Homestead, MD  [facility] Providers Cardiologist:  Jerryl Morin, DO    History of Present Illness:   Discussed the use of AI scribe software for clinical note transcription with the patient, who gave verbal consent to proceed.  History of Present Illness VEASNA [name] is a 88 y.o. male with a hx of nonobstructive CAD by cath 2010, mild AI, mild dilation of ascending aorta, suspected dementia (seeing neurology), HTN, HLD, COPD, bladder cancer, BPH, depression, DDD, essential tremor, hiatal hernia, TIA, RLS, moderate right renal artery stenosis by cath 2010, chronic pain. Patient recently admitted in the setting of near syncope with hypotension and cardiology was asked to evaluate. It was felt that patient's symptoms were large part driven by significant weight loss without concurrent change in medication dosing as well as possible component of autonomic dysfunction. Cardiology recommended discontinuing Losartan  with PRN lasix  only and PRN hydralazine  for acute hypertension. Compression socks also recommended. Both a carotid artery duplex and cardiac ultrasound have been checked since our consult and both are without significant findings to explain his hypotension or near syncope.   Patient presents with his son for follow-up after recent medication changes and hospitalization for hypotension.  His blood pressure has been stable, generally ranging from 120 to 150 systolic over 40 to 60 diastolic, with higher readings in the afternoon and evening that usually return to normal. No recurrence of severe hypotension or syncope since his recent hospitalization.  He has a history of lightheadedness for years, which has not changed recently.   He has not taken Lasix  since hospital discharge and reports no recent ankle swelling. He has not needed hydralazine  (given PRN for hypertension) or  nitroglycerin  recently. Current medications include memantine  for dementia, which was restarted after a brief discontinuation.  He has experienced leg weakness since hospitalization, which has improved but persists. He attributes some weakness to decreased food intake, as he eats less than before, often having only a small breakfast and minimal lunch and dinner.  He has a history of dementia and is currently on memantine . He reports decreased appetite.  He has a history of bladder cancer and kidney issues, but recent lab results show stable kidney function. He was previously on tamsulosin  for prostate issues, which was discontinued due to its potential impact on blood pressure. He reports increased frequency of urination since stopping the medication.  Studies Reviewed:    EKG:        2023-12-14 TTE  IMPRESSIONS     1. Left ventricular ejection fraction, by estimation, is 60 to 65%. The  left ventricle has normal function. The left ventricle has no regional  wall motion abnormalities. Left ventricular diastolic parameters are  indeterminate.   2. Right ventricular systolic function is normal. The right ventricular  size is normal. There is normal pulmonary artery systolic pressure.   3. The mitral valve is normal in structure. Trivial mitral valve  regurgitation. No evidence of mitral stenosis.   4. The aortic valve is tricuspid. Aortic valve regurgitation is trivial.  Aortic valve sclerosis/calcification is present, without any evidence of  aortic stenosis. Aortic regurgitation PHT measures 1002 msec. Aortic valve  area, by VTI measures 3.37 cm.  Aortic valve mean gradient measures 3.0 mmHg. Aortic valve Vmax measures  1.17 m/s.   5. Aortic dilatation noted. There is mild dilatation of the ascending  aorta, measuring 38  mm.   6. The inferior vena cava is normal in size with greater than 50%  respiratory variability, suggesting right atrial pressure of 3 mmHg.   FINDINGS   Left  Ventricle: Left ventricular ejection fraction, by estimation, is 60  to 65%. The left ventricle has normal function. The left ventricle has no  regional wall motion abnormalities. The left ventricular internal cavity  size was normal in size. There is   no left ventricular hypertrophy. Left ventricular diastolic parameters  are indeterminate. Normal left ventricular filling pressure.   Right Ventricle: The right ventricular size is normal. No increase in  right ventricular wall thickness. Right ventricular systolic function is  normal. There is normal pulmonary artery systolic pressure. The tricuspid  regurgitant velocity is 2.50 m/s, and   with an assumed right atrial pressure of 3 mmHg, the estimated right  ventricular systolic pressure is 28.0 mmHg.   Left Atrium: Left atrial size was normal in size.   Right Atrium: Right atrial size was normal in size.   Pericardium: There is no evidence of pericardial effusion.   Mitral Valve: The mitral valve is normal in structure. Trivial mitral  valve regurgitation. No evidence of mitral valve stenosis. MV peak  gradient, 2.7 mmHg. The mean mitral valve gradient is 1.0 mmHg.   Tricuspid Valve: The tricuspid valve is normal in structure. Tricuspid  valve regurgitation is not demonstrated. No evidence of tricuspid  stenosis.   Aortic Valve: The aortic valve is tricuspid. Aortic valve regurgitation is  trivial. Aortic regurgitation PHT measures 1002 msec. Aortic valve  sclerosis/calcification is present, without any evidence of aortic  stenosis. Aortic valve mean gradient measures   3.0 mmHg. Aortic valve peak gradient measures 5.5 mmHg. Aortic valve  area, by VTI measures 3.37 cm.   Pulmonic Valve: The pulmonic valve was normal in structure. Pulmonic valve  regurgitation is trivial. No evidence of pulmonic stenosis.   Aorta: Aortic dilatation noted. There is mild dilatation of the ascending  aorta, measuring 38 mm.   Venous: The  inferior vena cava is normal in size with greater than 50%  respiratory variability, suggesting right atrial pressure of 3 mmHg.   IAS/Shunts: No atrial level shunt detected by color flow Doppler.   12/10/23 Carotid Artery Duplex  Summary:  Right Carotid: Velocities in the right ICA are consistent with a 1-39%  stenosis.   Left Carotid: Velocities in the left ICA are consistent with a 1-39%  stenosis.   Vertebrals: Bilateral vertebral arteries demonstrate antegrade flow.     Risk Assessment/Calculations:           Physical Exam:   VS:  BP 120/70   Pulse (!) 56   Ht 5' 6 (1.676 m)   Wt 177 lb 3.2 oz (80.4 kg)   SpO2 98%   BMI 28.60 kg/m    Wt Readings from Last 3 Encounters:  12/28/23 177 lb 3.2 oz (80.4 kg)  12/24/23 173 lb (78.5 kg)  12/10/23 173 lb 3.2 oz (78.6 kg)     Physical Exam Vitals reviewed.  Constitutional:      Appearance: Normal appearance.  HENT:     Head: Normocephalic.   Eyes:     Pupils: Pupils are equal, round, and reactive to light.    Cardiovascular:     Rate and Rhythm: Normal rate and regular rhythm.     Pulses: Normal pulses.     Heart sounds: Normal heart sounds.  Pulmonary:     Effort: Pulmonary effort is  normal.     Breath sounds: Normal breath sounds.  Abdominal:     General: Abdomen is flat.     Palpations: Abdomen is soft.   Musculoskeletal:     Right lower leg: No edema.     Left lower leg: No edema.   Skin:    General: Skin is warm and dry.     Capillary Refill: Capillary refill takes less than 2 seconds.   Neurological:     General: No focal deficit present.     Mental Status: He is alert and oriented to person, place, and time.   Psychiatric:        Mood and Affect: Mood normal.        Behavior: Behavior normal.        Thought Content: Thought content normal.        Judgment: Judgment normal.     ASSESSMENT AND PLAN:    Assessment & Plan Essential hypertension Recent hypotension with near  syncope Recent admission with hypotension and orthostatic vital signs. Blood pressure since discharge is well-controlled with recent medication adjustments. Systolic blood pressure is typically around 120 mmHg, with diastolic in the 50s. No recent episodes of hypotension or syncope. No need for daily antihypertensive medication currently. - Use Lasix  as needed for swelling, though not anticipated to be necessary. - Keep hydralazine  on hand for emergency use if blood pressure exceeds 170 mmHg and does not decrease.  Mild carotid artery stenosis Mild carotid artery stenosis identified on updated carotid artery duplex study. - Consider carotid artery ultrasound every 1-2 years to monitor stenosis.  Borderline aortic dilation Aortic dilation measured at 38 mm on updated echo. - Perform echocardiogram every 1-2 years to monitor aortic dilation.  Nonobstructive CAD Hyperlipidemia Cholesterol levels are higher than desired with LDL 84mg /dL. Patient historically on Simvastatin . Plan to switch to atorvastatin  to better control cholesterol levels and reduce cardiovascular risk. - Discontinue simvastatin . - Start atorvastatin  (Lipitor ) 40mg  and recheck cholesterol levels in 2-3 months.  Dementia Patient recently diagnosed with dementia, specific type pending further testing. Memantine  has been prescribed to help slow cognitive decline. Decreased appetite potentially related to dementia. - Continue memantine  as prescribed by neurology.  Leg weakness Persistent leg weakness following discharge. Discussed that I am not able to attributed to current blood pressure or cardiac issues. Possible contribution from decreased nutritional intake and medication side effects. Neurological evaluation may be needed if symptoms persist. - Monitor leg weakness and report any changes.            Signed, Leala Prince, PA-C

## 2023-12-28 ENCOUNTER — Ambulatory Visit: Attending: Cardiology | Admitting: Cardiology

## 2023-12-28 ENCOUNTER — Encounter: Payer: Self-pay | Admitting: Cardiology

## 2023-12-28 VITALS — BP 120/70 | HR 56 | Ht 66.0 in | Wt 177.2 lb

## 2023-12-28 DIAGNOSIS — R55 Syncope and collapse: Secondary | ICD-10-CM | POA: Diagnosis present

## 2023-12-28 DIAGNOSIS — I700 Atherosclerosis of aorta: Secondary | ICD-10-CM | POA: Insufficient documentation

## 2023-12-28 DIAGNOSIS — I10 Essential (primary) hypertension: Secondary | ICD-10-CM | POA: Insufficient documentation

## 2023-12-28 DIAGNOSIS — I2510 Atherosclerotic heart disease of native coronary artery without angina pectoris: Secondary | ICD-10-CM | POA: Diagnosis not present

## 2023-12-28 DIAGNOSIS — E782 Mixed hyperlipidemia: Secondary | ICD-10-CM | POA: Diagnosis not present

## 2023-12-28 DIAGNOSIS — I779 Disorder of arteries and arterioles, unspecified: Secondary | ICD-10-CM | POA: Diagnosis present

## 2023-12-28 MED ORDER — ATORVASTATIN CALCIUM 40 MG PO TABS: 40.0000 mg | ORAL_TABLET | Freq: Every day | ORAL | 3 refills | Status: DC

## 2023-12-28 NOTE — Patient Instructions (Signed)
 Medication Instructions:  STOP Simvastatin   START Atorvastatin  (Lipitor ) 40 mg once daily   *If you need a refill on your cardiac medications before your next appointment, please call your pharmacy*  Lab Work: To be completed in 8 weeks: lipid panel and liver function panel  If you have labs (blood work) drawn today and your tests are completely normal, you will receive your results only by: MyChart Message (if you have MyChart) OR A paper copy in the mail If you have any lab test that is abnormal or we need to change your treatment, we will call you to review the results.  Testing/Procedures: None ordered today.  Follow-Up: At [facility], you and your health needs are our priority.  As part of our continuing mission to provide you with exceptional heart care, our providers are all part of one team.  This team includes your primary Cardiologist (physician) and Advanced Practice Providers or APPs (Physician Assistants and Nurse Practitioners) who all work together to provide you with the care you need, when you need it.  Your next appointment:   6 month(s)  Provider:   Kardie Tobb, DO

## 2024-01-01 LAB — QUEST AD-DETECT™, BETA-AMYLOID 42/40 RATIO, PLASMA
ABETA 40: 301 pg/mL
ABETA 42/40 RATIO: 0.169 — ABNORMAL LOW (ref >=0.170)
ABETA 42: 51 pg/mL

## 2024-01-01 LAB — QUEST AD-DETECT PHOSPHORYLATED TAU217(P-TAU217), PLASMA: Quest Detect PTAU217, Plasma: 0.34 pg/mL — ABNORMAL HIGH (ref <=0.15)

## 2024-01-01 LAB — QUEST AD-DETECT® PHOSPHORYLATED TAU181(P-TAU181), PLASMA: QUEST AD DETECT PTAU181, PLASMA: 1.19 pg/mL — ABNORMAL HIGH (ref <=1.07)

## 2024-01-22 ENCOUNTER — Ambulatory Visit (INDEPENDENT_AMBULATORY_CARE_PROVIDER_SITE_OTHER): Admitting: Podiatry

## 2024-01-22 DIAGNOSIS — M79675 Pain in left toe(s): Secondary | ICD-10-CM | POA: Diagnosis not present

## 2024-01-22 DIAGNOSIS — K279 Peptic ulcer, site unspecified, unspecified as acute or chronic, without hemorrhage or perforation: Secondary | ICD-10-CM | POA: Insufficient documentation

## 2024-01-22 DIAGNOSIS — M79674 Pain in right toe(s): Secondary | ICD-10-CM

## 2024-01-22 DIAGNOSIS — B351 Tinea unguium: Secondary | ICD-10-CM | POA: Diagnosis not present

## 2024-01-22 NOTE — Progress Notes (Signed)
 Subjective:  Patient ID: [name], male    DOB: [birth year],  MRN: [id number]   [name] presents to clinic today for:  Chief Complaint  Patient presents with   [name]    Not diabetic, takes ASA 81 mg. No callus/corn. Needs nails trimmed.    Patient notes nails are thick, discolored, elongated and painful in shoegear when trying to ambulate.  He is using a walker for assistance today.  PCP is Rollene Almarie LABOR, MD.  Past Medical History:  Diagnosis Date   Allergic rhinitis    Bladder cancer (HCC)    BPH (benign prostatic hyperplasia)    Bradycardia    CAD (coronary artery disease)    Chronic low back pain    COPD (chronic obstructive pulmonary disease) (HCC)    DDD (degenerative disc disease), lumbosacral    Depression    Diverticulosis of colon (without mention of hemorrhage)    Essential tremor    Fatty liver disease, nonalcoholic    GERD (gastroesophageal reflux disease)    Glaucoma    History of bladder cancer    2004--  TCC low-grade , non-invasive   History of colon polyps    History of exercise stress test    02-05-2007-- ETT (Clinically positive, electrically equivocal, submaximal ETT,  appropriate bp response to exercise   History of hiatal hernia    History of kidney stones    History of peptic ulcer    HLD (hyperlipidemia)    HTN (hypertension)    Mild CAD    Nocturia    Pneumonia    Productive cough    Rheumatoid arthritis(714.0)    Right renal artery stenosis (HCC)    per cath 03-14-2009 --- 40-50%   RLS (restless legs syndrome)    Seizures (HCC)    Stroke (HCC)    TIA   Vertigo, intermittent    Weak urinary stream    Wears dentures    upper   Wears glasses    Wears hearing aid    bilateral   Wears partial dentures    lower    Past Surgical History:  Procedure Laterality Date   CARDIAC CATHETERIZATION  02-12-2007  dr gregg taylor   Non-obstructive CAD,  20% pLCX,  20% LAD, ef 65%   CARDIAC CATHETERIZATION  03-14-2009  dr  delford   pLAD and mid diaginal 20-30% multiple lesions,  OM1 20%, right renal ostial 40-50%,  ef 60%   CATARACT EXTRACTION W/ INTRAOCULAR LENS  IMPLANT, BILATERAL  07/14/2014   CYSTOSCOPY WITH BIOPSY N/A 07/11/2015   Procedure: CYSTOSCOPY WITH COLD CUP RESECTION AND FULGERATION;  Surgeon: Alm Fragmin, MD;  Location: [facility];  Service: Urology;  Laterality: N/A;   HIP ARTHROSCOPY W/ LABRAL DEBRIDEMENT Left 06/29/2000   and chondraplasty   JOINT REPLACEMENT     right big toe replacement   KYPHOPLASTY N/A 03/28/2021   Procedure: KYPHOPLASTY lumbar one;  Surgeon: Burnetta Aures, MD;  Location: [name];  Service: Orthopedics;  Laterality: N/A;   LUMBAR DISC SURGERY  07/16/2005   LUMBAR LAMINECTOMY/DECOMPRESSION MICRODISCECTOMY  07/09/2011   Procedure: LUMBAR LAMINECTOMY/DECOMPRESSION MICRODISCECTOMY;  Surgeon: Reyes JAYSON Billing;  Location: [facility];  Service: Orthopedics;  Laterality: Left;  Decompression L5 - S1 on the Left and repair of dura   LUMBAR LAMINECTOMY/DECOMPRESSION MICRODISCECTOMY N/A 07/27/2013   Procedure: DECOMPRESSION L4 - L5 WITH EXCISION OF SYNOVIAL CYST AND, LATERAL MASS FUSION 1 LEVEL;  Surgeon: Reyes JAYSON Billing, MD;  Location: [facility];  Service: Orthopedics;  Laterality: N/A;   LUMBAR LAMINECTOMY/DECOMPRESSION MICRODISCECTOMY Left 07/06/2014   Procedure:  MICRO LUMBAR DECOMPRESSION L5-S1 ON LEFT, L4-5 REDO;  Surgeon: Reyes JAYSON Billing, MD;  Location: [facility];  Service: Orthopedics;  Laterality: Left;   SPINAL CORD STIMULATOR BATTERY EXCHANGE N/A 03/28/2021   Procedure: SPINAL CORD STIMULATOR BATTERY EXCHANGE/REPLACEMENT, L1 KYPHOPLASTY;  Surgeon: Burnetta Aures, MD;  Location: [facility];  Service: Orthopedics;  Laterality: N/A;  90 MINS LOCAL WITH IV REG   SPINAL CORD STIMULATOR INSERTION N/A 09/11/2016   Procedure: Spinal cord stimulator placement;  Surgeon: Aures Burnetta, MD;  Location: [facility];  Service: Orthopedics;  Laterality: N/A;   TONSILLECTOMY AND ADENOIDECTOMY      TRANSTHORACIC ECHOCARDIOGRAM  09/20/2010   normal LVF, ef 55-65%/  mild MR, TR and PR/  mild LAE   TRANSURETHRAL RESECTION OF BLADDER TUMOR  09/21/2002    Allergies  Allergen Reactions   Morphine  And Codeine Other (See Comments)    Migraine    Sulfa Antibiotics Itching, Rash and Other (See Comments)   Hydrochlorothiazide  Other (See Comments)    hyponatremia with daily use   Tape Itching and Other (See Comments)    Patient  Is ok to use paper tape   Latex Rash   Lisinopril  Cough   Terazosin Other (See Comments)    Dizziness    Review of Systems: Negative except as noted in the HPI.  Objective:  [name] is a pleasant 88 y.o. male in NAD. AAO x 3.  Vascular Examination: Capillary refill time is 3-5 seconds to toes bilateral. Palpable pedal pulses b/l LE. Digital hair present b/l.  Skin temperature gradient WNL b/l. No varicosities b/l. No cyanosis noted b/l.   Dermatological Examination: Pedal skin with normal turgor, texture and tone b/l. No open wounds. No interdigital macerations b/l. Toenails x10 are 3mm thick, discolored, dystrophic with subungual debris. There is pain with compression of the nail plates.  They are elongated x10  Assessment/Plan: 1. Pain due to onychomycosis of toenails of both feet    The mycotic toenails were sharply debrided x10 with sterile nail nippers and a power debriding burr to decrease bulk/thickness and length.    Return in about 9 weeks (around 03/25/2024) for RFC.   [name], DPM, FACFAS [name]     [address] [place], [place] [address]                Office [phone]  Fax [phone]

## 2024-02-22 ENCOUNTER — Other Ambulatory Visit: Payer: Self-pay | Admitting: Internal Medicine

## 2024-04-13 ENCOUNTER — Ambulatory Visit (INDEPENDENT_AMBULATORY_CARE_PROVIDER_SITE_OTHER)

## 2024-04-13 VITALS — Ht 66.0 in | Wt 160.0 lb

## 2024-04-13 DIAGNOSIS — Z0000 Encounter for general adult medical examination without abnormal findings: Secondary | ICD-10-CM

## 2024-04-13 NOTE — Patient Instructions (Addendum)
 [name],  Thank you for taking the time for your Medicare Wellness Visit. I appreciate your continued commitment to your health goals. Please review the care plan we discussed, and feel free to reach out if I can assist you further.  Medicare recommends these wellness visits once per year to help you and your care team stay ahead of potential health issues. These visits are designed to focus on prevention, allowing your provider to concentrate on managing your acute and chronic conditions during your regular appointments.  Please note that Annual Wellness Visits do not include a physical exam. Some assessments may be limited, especially if the visit was conducted virtually. If needed, we may recommend a separate in-person follow-up with your provider.  Ongoing Care Seeing your primary care provider every 3 to 6 months helps us  monitor your health and provide consistent, personalized care.   Referrals If a referral was made during today's visit and you haven't received any updates within two weeks, please contact the referred provider directly to check on the status.  Recommended Screenings:  Health Maintenance  Topic Date Due   Flu Shot  02/12/2024   COVID-19 Vaccine (9 - 2025-26 season) 03/14/2024   Medicare Annual Wellness Visit  04/13/2025   DTaP/Tdap/Td vaccine (5 - Td or Tdap) 01/01/2033   Pneumococcal Vaccine for age over 74  Completed   Zoster (Shingles) Vaccine  Completed   HPV Vaccine  Aged Out   Meningitis B Vaccine  Aged Out       04/13/2024    3:45 PM  Advanced Directives  Does Patient Have a Medical Advance Directive? No  Would patient like information on creating a medical advance directive? No - Patient declined   Advance Care Planning is important because it: Ensures you receive medical care that aligns with your values, goals, and preferences. Provides guidance to your family and loved ones, reducing the emotional burden of decision-making during critical  moments.  Vision: Annual vision screenings are recommended for early detection of glaucoma, cataracts, and diabetic retinopathy. These exams can also reveal signs of chronic conditions such as diabetes and high blood pressure.  Dental: Annual dental screenings help detect early signs of oral cancer, gum disease, and other conditions linked to overall health, including heart disease and diabetes.

## 2024-04-13 NOTE — Progress Notes (Addendum)
 Subjective:  Please attest and cosign this visit due to patients primary care provider not being in the office at the time the visit was completed.  (Pt of Dr Almarie Cleveland)   [name] is a 88 y.o. who presents for a Medicare Wellness preventive visit.  As a reminder, Annual Wellness Visits don't include a physical exam, and some assessments may be limited, especially if this visit is performed virtually. We may recommend an in-person follow-up visit with your provider if needed.  Visit Complete: Virtual I connected with  [name] on 04/13/24 by a audio enabled telemedicine application and verified that I am speaking with the correct person using two identifiers.  Patient Location: Home  Provider Location: Office/Clinic  I discussed the limitations of evaluation and management by telemedicine. The patient expressed understanding and agreed to proceed.  Vital Signs: Because this visit was a virtual/telehealth visit, some criteria may be missing or patient reported. Any vitals not documented were not able to be obtained and vitals that have been documented are patient reported.  VideoDeclined- This patient declined Librarian, academic. Therefore the visit was completed with audio only.  Persons Participating in Visit: Patient.  AWV Questionnaire: No: Patient Medicare AWV questionnaire was not completed prior to this visit.  Cardiac Risk Factors include: advanced age (>85men, >98 women);dyslipidemia;hypertension;male gender     Objective:    Today's Vitals   04/13/24 1545  Weight: 160 lb (72.6 kg)  Height: 5' 6 (1.676 m)   Body mass index is 25.82 kg/m.     04/13/2024    3:45 PM 12/24/2023   11:23 AM 12/09/2023   11:37 AM 10/16/2023    1:22 PM 04/13/2023   11:42 AM 04/10/2022    1:05 PM 04/09/2021    5:51 PM  Advanced Directives  Does Patient Have a Medical Advance Directive? No Yes Yes Yes Yes Yes Yes  Type of Astronomer will Healthcare Power of [employer] Power of [place];Living will Healthcare Power of [place];Living will Healthcare Power of [place];Living will  Does patient want to make changes to medical advance directive?  No - Patient declined  No - Patient declined No - Patient declined  No - Patient declined  Copy of Healthcare Power of Attorney in Chart?  No - copy requested  No - copy requested No - copy requested No - copy requested No - copy requested  Would patient like information on creating a medical advance directive? No - Patient declined      No - Patient declined    Current Medications (verified) Outpatient Encounter Medications as of 04/13/2024  Medication Sig   acetaminophen  (TYLENOL ) 650 MG CR tablet Take 1,300 mg by mouth at bedtime.   albuterol  (VENTOLIN  HFA) 108 (90 Base) MCG/ACT inhaler Inhale 2 puffs into the lungs every 6 (six) hours as needed for wheezing or shortness of breath.   ammonium lactate (LAC-HYDRIN) 12 % lotion Apply 1 Application topically as needed for dry skin.   aspirin  EC 81 MG tablet Take 81 mg by mouth in the morning. Swallow whole.   atorvastatin  (LIPITOR ) 40 MG tablet Take 1 tablet (40 mg total) by mouth daily.   brimonidine  (ALPHAGAN ) 0.15 % ophthalmic solution Place 1 drop into both eyes 2 (two) times daily.   cetirizine  (ZYRTEC ) 10 MG tablet Take 10 mg by mouth in the morning.   clotrimazole  (LOTRIMIN ) 1 % cream Apply 1 application  topically at bedtime  as needed (FOR RASH). Prescribed for rash in groin   fluticasone  (FLONASE ) 50 MCG/ACT nasal spray USE 2 SPRAYS IN EACH NOSTRIL DAILY   furosemide  (LASIX ) 20 MG tablet Take 1 tablet (20 mg total) by mouth daily as needed for edema or fluid.   gabapentin  (NEURONTIN ) 300 MG capsule Take 1 capsule (300 mg total) by mouth at bedtime.   hydrALAZINE  (APRESOLINE ) 10 MG tablet TAKE 1 TABLET THREE TIMES DAILY AS NEEDED FOR BLOOD PRESSURE GREATER THAN 150/90   meclizine   (ANTIVERT ) 25 MG tablet TAKE 1 TABLET THREE TIMES DAILY AS NEEDED FOR DIZZINESS   memantine  (NAMENDA ) 5 MG tablet Take 5 mg by mouth daily.   nitroGLYCERIN  (NITROSTAT ) 0.4 MG SL tablet PLACE 1 TABLET UNDER THE TONGUE EVERY 5 MINUTES AS NEEDED FOR CHEST PAIN. MAX 3 DOSES. CALL 911 IF NO IMPROVEMENT AFTER 1ST DOSE (Patient taking differently: Place 0.4 mg under the tongue every 5 (five) minutes as needed for chest pain.)   omeprazole  (PRILOSEC) 40 MG capsule Take 1 capsule (40 mg total) by mouth daily. TAKE 1 CAPSULE TWICE DAILY (Patient taking differently: Take 20 mg by mouth in the morning and at bedtime.)   ondansetron  (ZOFRAN ) 4 MG tablet Take 1 tablet (4 mg total) by mouth as needed for nausea or vomiting.   polyvinyl alcohol  (LIQUIFILM TEARS) 1.4 % ophthalmic solution Place 1 drop into both eyes in the morning and at bedtime.   primidone  (MYSOLINE ) 50 MG tablet Take 50 mg by mouth in the morning.   timolol  (TIMOPTIC ) 0.5 % ophthalmic solution Place 1 drop into both eyes 2 (two) times daily.    Tiotropium Bromide  Monohydrate (SPIRIVA  RESPIMAT) 2.5 MCG/ACT AERS Inhale 2.5 mcg into the lungs in the morning.   traZODone  (DESYREL ) 50 MG tablet Take 50 mg by mouth at bedtime.   vitamin B-12 (CYANOCOBALAMIN ) 500 MCG tablet Take 500 mcg by mouth in the morning.   [DISCONTINUED] Insulin  Detemir (LEVEMIR ) 100 UNIT/ML Pen Inject 5 Units into the skin 2 (two) times daily. (Patient not taking: Reported on 01/19/2020)   [DISCONTINUED] ipratropium-albuterol  (DUONEB) 0.5-2.5 (3) MG/3ML SOLN Take 3 mLs by nebulization every 6 (six) hours as needed. (Patient not taking: Reported on 01/19/2020)   No facility-administered encounter medications on file as of 04/13/2024.    Allergies (verified) Morphine  and codeine, Sulfa antibiotics, Hydrochlorothiazide , Tape, Latex, Lisinopril , and Terazosin   History: Past Medical History:  Diagnosis Date   Allergic rhinitis    Bladder cancer (HCC)    BPH (benign prostatic  hyperplasia)    Bradycardia    CAD (coronary artery disease)    Chronic low back pain    COPD (chronic obstructive pulmonary disease) (HCC)    DDD (degenerative disc disease), lumbosacral    Depression    Diverticulosis of colon (without mention of hemorrhage)    Essential tremor    Fatty liver disease, nonalcoholic    GERD (gastroesophageal reflux disease)    Glaucoma    History of bladder cancer    2004--  TCC low-grade , non-invasive   History of colon polyps    History of exercise stress test    02-05-2007-- ETT (Clinically positive, electrically equivocal, submaximal ETT,  appropriate bp response to exercise   History of hiatal hernia    History of kidney stones    History of peptic ulcer    HLD (hyperlipidemia)    HTN (hypertension)    Mild CAD    Nocturia    Pneumonia    Productive cough  Rheumatoid arthritis(714.0)    Right renal artery stenosis    per cath 03-14-2009 --- 40-50%   RLS (restless legs syndrome)    Seizures (HCC)    Stroke (HCC)    TIA   Vertigo, intermittent    Weak urinary stream    Wears dentures    upper   Wears glasses    Wears hearing aid    bilateral   Wears partial dentures    lower   Past Surgical History:  Procedure Laterality Date   CARDIAC CATHETERIZATION  02-12-2007  dr gregg taylor   Non-obstructive CAD,  20% pLCX,  20% LAD, ef 65%   CARDIAC CATHETERIZATION  03-14-2009  dr delford   pLAD and mid diaginal 20-30% multiple lesions,  OM1 20%, right renal ostial 40-50%,  ef 60%   CATARACT EXTRACTION W/ INTRAOCULAR LENS  IMPLANT, BILATERAL  07/14/2014   CYSTOSCOPY WITH BIOPSY N/A 07/11/2015   Procedure: CYSTOSCOPY WITH COLD CUP RESECTION AND FULGERATION;  Surgeon: Alm Fragmin, MD;  Location: [facility];  Service: Urology;  Laterality: N/A;   HIP ARTHROSCOPY W/ LABRAL DEBRIDEMENT Left 06/29/2000   and chondraplasty   JOINT REPLACEMENT     right big toe replacement   KYPHOPLASTY N/A 03/28/2021   Procedure:  KYPHOPLASTY lumbar one;  Surgeon: Burnetta Aures, MD;  Location: [facility] OR;  Service: Orthopedics;  Laterality: N/A;   LUMBAR DISC SURGERY  07/16/2005   LUMBAR LAMINECTOMY/DECOMPRESSION MICRODISCECTOMY  07/09/2011   Procedure: LUMBAR LAMINECTOMY/DECOMPRESSION MICRODISCECTOMY;  Surgeon: Reyes JAYSON Billing;  Location: [facility];  Service: Orthopedics;  Laterality: Left;  Decompression L5 - S1 on the Left and repair of dura   LUMBAR LAMINECTOMY/DECOMPRESSION MICRODISCECTOMY N/A 07/27/2013   Procedure: DECOMPRESSION L4 - L5 WITH EXCISION OF SYNOVIAL CYST AND, LATERAL MASS FUSION 1 LEVEL;  Surgeon: Reyes JAYSON Billing, MD;  Location: [facility];  Service: Orthopedics;  Laterality: N/A;   LUMBAR LAMINECTOMY/DECOMPRESSION MICRODISCECTOMY Left 07/06/2014   Procedure:  MICRO LUMBAR DECOMPRESSION L5-S1 ON LEFT, L4-5 REDO;  Surgeon: Reyes JAYSON Billing, MD;  Location: [facility];  Service: Orthopedics;  Laterality: Left;   SPINAL CORD STIMULATOR BATTERY EXCHANGE N/A 03/28/2021   Procedure: SPINAL CORD STIMULATOR BATTERY EXCHANGE/REPLACEMENT, L1 KYPHOPLASTY;  Surgeon: Burnetta Aures, MD;  Location: [facility] OR;  Service: Orthopedics;  Laterality: N/A;  90 MINS LOCAL WITH IV REG   SPINAL CORD STIMULATOR INSERTION N/A 09/11/2016   Procedure: Spinal cord stimulator placement;  Surgeon: Aures Burnetta, MD;  Location: [facility] OR;  Service: Orthopedics;  Laterality: N/A;   TONSILLECTOMY AND ADENOIDECTOMY     TRANSTHORACIC ECHOCARDIOGRAM  09/20/2010   normal LVF, ef 55-65%/  mild MR, TR and PR/  mild LAE   TRANSURETHRAL RESECTION OF BLADDER TUMOR  09/21/2002   Family History  Problem Relation Age of Onset   Heart attack Mother    Cancer Father        lung   Cancer Sister        lung   Diabetes Sister    Cancer Brother        lung that spread to brain   Social History   Socioeconomic History   Marital status: Widowed    Spouse name: Not on file   Number of children: 2   Years of education: 8   Highest education level: Not on file   Occupational History   Occupation: retired from [employer]    Employer: RETIRED  Tobacco Use   Smoking status: Former    Current packs/day: 0.00  Average packs/day: 2.0 packs/day for 20.0 years (40.0 ttl pk-yrs)    Types: Cigarettes    Start date: 10/08/1962    Quit date: 10/08/1982    Years since quitting: 41.5   Smokeless tobacco: Never  Vaping Use   Vaping status: Never Used  Substance and Sexual Activity   Alcohol  use: Not Currently    Comment: rare wine or beer occassionally   Drug use: No   Sexual activity: Not Currently  Other Topics Concern   Not on file  Social History Narrative   Right handed   Drinks caffeine prn   Lives with son [place]   Has a daughter [place]    One level home   Social Drivers of Health   Financial Resource Strain: Low Risk  (04/13/2024)   Overall Financial Resource Strain (CARDIA)    Difficulty of Paying Living Expenses: Not hard at all  Food Insecurity: No Food Insecurity (04/13/2024)   Hunger Vital Sign    Worried About Running Out of Food in the Last Year: Never true    Ran Out of Food in the Last Year: Never true  Transportation Needs: No Transportation Needs (04/13/2024)   PRAPARE - Administrator, Civil Service (Medical): No    Lack of Transportation (Non-Medical): No  Physical Activity: Inactive (04/13/2024)   Exercise Vital Sign    Days of Exercise per Week: 0 days    Minutes of Exercise per Session: 0 min  Stress: No Stress Concern Present (04/13/2024)   [employer] of Occupational Health - Occupational Stress Questionnaire    Feeling of Stress: Not at all  Social Connections: Socially Isolated (04/13/2024)   Social Connection and Isolation Panel    Frequency of Communication with Friends and Family: Once a week    Frequency of Social Gatherings with Friends and Family: Once a week    Attends Religious Services: Never    Database administrator or Organizations: No    Attends Banker Meetings: Never     Marital Status: Widowed    Tobacco Counseling Counseling given: Not Answered    Clinical Intake:  Pre-visit preparation completed: Yes  Pain : No/denies pain     BMI - recorded: 25.82 Nutritional Status: BMI 25 -29 Overweight Nutritional Risks: None Diabetes: No  Lab Results  Component Value Date   HGBA1C 5.7 11/30/2023   HGBA1C 6.0 09/17/2022   HGBA1C 5.6 12/11/2021     How often do you need to have someone help you when you read instructions, pamphlets, or other written materials from your doctor or pharmacy?: 1 - Never  Interpreter Needed?: No  Information entered by :: [name], CMA   Activities of Daily Living     04/13/2024    3:49 PM  In your present state of health, do you have any difficulty performing the following activities:  Hearing? 0  Comment wears hearing aids  Vision? 0  Difficulty concentrating or making decisions? 0  Walking or climbing stairs? 1  Comment cane/walker/wheelchair  Dressing or bathing? 0  Doing errands, shopping? 0  Preparing Food and eating ? N  Using the Toilet? N  In the past six months, have you accidently leaked urine? N  Do you have problems with loss of bowel control? N  Managing your Medications? N  Managing your Finances? N  Housekeeping or managing your Housekeeping? N    Patient Care Team: Rollene Almarie LABOR, MD as PCP - General (Internal Medicine) Tobb, Kardie, DO as  PCP - Cardiology (Cardiology)  I have updated your Care Teams any recent Medical Services you may have received from other providers in the past year.     Assessment:   This is a routine wellness examination for [name].  Hearing/Vision screen Hearing Screening - Comments:: Wears hearing aids Vision Screening - Comments:: Wears rx glasses - up to date with routine eye exams with [facility]   Goals Addressed               This Visit's Progress     Patient Stated (pt-stated)        Patient stated he plans to continue stay  active/walking       Depression Screen     04/13/2024    3:51 PM 11/30/2023    9:27 AM 07/21/2023    4:10 PM 04/13/2023   11:41 AM 11/10/2022   10:08 AM 09/17/2022   10:51 AM 06/09/2022    1:05 PM  PHQ 2/9 Scores  PHQ - 2 Score 0 0 0 0 0 0 0  PHQ- 9 Score 6 0 0   0 0    Fall Risk     04/13/2024    3:50 PM 12/24/2023   11:23 AM 11/30/2023    9:27 AM 10/16/2023    1:24 PM 04/13/2023   11:42 AM  Fall Risk   Falls in the past year? 1 1 1 1 1   Number falls in past yr: 1 1 1 1 1   Comment 2      Injury with Fall? 0 0 0 1 0  Risk for fall due to : Impaired balance/gait    History of fall(s);Impaired balance/gait  Follow up Falls evaluation completed;Falls prevention discussed Falls evaluation completed Falls evaluation completed Falls evaluation completed Falls evaluation completed    MEDICARE RISK AT HOME:  Medicare Risk at Home Any stairs in or around the home?: No If so, are there any without handrails?: No Home free of loose throw rugs in walkways, pet beds, electrical cords, etc?: Yes Adequate lighting in your home to reduce risk of falls?: Yes Life alert?: No Use of a cane, walker or w/c?: Yes (cane/walker/wheelchair) Grab bars in the bathroom?: Yes Shower chair or bench in shower?: Yes Elevated toilet seat or a handicapped toilet?: Yes  TIMED UP AND GO:  Was the test performed?  No  Cognitive Function: 6CIT completed    10/16/2023    4:00 PM 12/14/2017    9:47 AM  MMSE - Mini Mental State Exam  Orientation to time 4 5  Orientation to Place 3 5  Registration 3 3  Attention/ Calculation 0 3  Recall 1 3  Language- name 2 objects 2 2  Language- repeat 1 1  Language- follow 3 step command 2 3  Language- read & follow direction 1 1  Write a sentence 0 1  Copy design 0 1  Total score 17 28        04/13/2024    3:53 PM 04/13/2023   11:44 AM 04/10/2022    1:28 PM  6CIT Screen  What Year? 0 points 0 points 0 points  What month? 0 points 0 points 0 points  What time? 0  points 0 points 0 points  Count back from 20 0 points 2 points 0 points  Months in reverse 2 points 2 points 0 points  Repeat phrase 8 points 0 points 0 points  Total Score 10 points 4 points 0 points    Immunizations Immunization History  Administered Date(s) Administered   Fluad Quad(high Dose 65+) 04/25/2020, 04/26/2021, 04/01/2022   INFLUENZA, HIGH DOSE SEASONAL PF 04/13/2013, 04/13/2014, 04/13/2016, 04/14/2017, 04/20/2018, 04/01/2022, 04/03/2023   Influenza Split 03/14/2012, 04/13/2017   Influenza Whole 05/17/2007, 04/19/2013   Influenza,inj,Quad PF,6+ Mos 04/05/2014, 06/04/2016   Influenza,inj,quad, With Preservative 04/12/2018, 04/13/2020   Influenza-Unspecified 04/14/2002, 04/26/2003, 05/10/2004, 06/04/2005, 06/05/2006, 06/01/2007, 04/13/2009, 04/13/2010, 07/15/2011, 04/13/2012, 05/14/2015, 04/14/2019, 04/13/2020   Moderna Covid-19 Fall Seasonal Vaccine 24yrs & older 07/30/2022   Moderna Covid-19 Vaccine Bivalent Booster 77yrs & up 04/26/2021   Moderna Sars-Covid-2 Vaccination 09/03/2019, 10/01/2019, 06/12/2020, 10/26/2020   Pneumococcal Conjugate-13 09/27/2013, 05/16/2014   Pneumococcal Polysaccharide-23 05/17/2007   Pneumococcal-Unspecified 06/13/2003, 06/04/2005   Respiratory Syncytial Virus Vaccine,Recomb Aduvanted(Arexvy) 09/02/2022   Td 09/02/2022   Tdap 11/28/2010, 10/20/2011, 01/02/2023   Unspecified SARS-COV-2 Vaccination 09/12/2019, 10/10/2019   Zoster Recombinant(Shingrix) 07/23/2017, 12/29/2017   Zoster, Live 10/25/2013    Screening Tests Health Maintenance  Topic Date Due   Influenza Vaccine  02/12/2024   COVID-19 Vaccine (9 - 2025-26 season) 03/14/2024   Medicare Annual Wellness (AWV)  04/13/2025   DTaP/Tdap/Td (5 - Td or Tdap) 01/01/2033   Pneumococcal Vaccine: 50+ Years  Completed   Zoster Vaccines- Shingrix  Completed   HPV VACCINES  Aged Out   Meningococcal B Vaccine  Aged Out    Health Maintenance Items Addressed:  04/13/2024  Additional  Screening:  Vision Screening: Recommended annual ophthalmology exams for early detection of glaucoma and other disorders of the eye. Is the patient up to date with their annual eye exam?  Yes  Who is the provider or what is the name of the office in which the patient attends annual eye exams? [facility] in [place], [place]  Dental Screening: Recommended annual dental exams for proper oral hygiene  Community Resource Referral / Chronic Care Management: CRR required this visit?  No   CCM required this visit?  No   Plan:    I have personally reviewed and noted the following in the patient's chart:   Medical and social history Use of alcohol , tobacco or illicit drugs  Current medications and supplements including opioid prescriptions. Patient is not currently taking opioid prescriptions. Functional ability and status Nutritional status Physical activity Advanced directives List of other physicians Hospitalizations, surgeries, and ER visits in previous 12 months Vitals Screenings to include cognitive, depression, and falls Referrals and appointments  In addition, I have reviewed and discussed with patient certain preventive protocols, quality metrics, and best practice recommendations. A written personalized care plan for preventive services as well as general preventive health recommendations were provided to patient.   Casey CHRISTELLA [name], CMA   04/13/2024   After Visit Summary: (MyChart) Due to this being a telephonic visit, the after visit summary with patients personalized plan was offered to patient via MyChart   Notes: Scheduled a 6-mth f/u w/PCP for 07/2024

## 2024-04-22 ENCOUNTER — Ambulatory Visit: Admitting: Podiatry

## 2024-04-29 ENCOUNTER — Ambulatory Visit: Admitting: Podiatry

## 2024-04-29 DIAGNOSIS — M79674 Pain in right toe(s): Secondary | ICD-10-CM | POA: Diagnosis not present

## 2024-04-29 DIAGNOSIS — B351 Tinea unguium: Secondary | ICD-10-CM

## 2024-04-29 DIAGNOSIS — M79675 Pain in left toe(s): Secondary | ICD-10-CM | POA: Diagnosis not present

## 2024-04-29 NOTE — Progress Notes (Unsigned)
 Subjective:  Patient ID: [name], male    DOB: Apr 20, 1936,  MRN: [id number]   STONY STEGMANN presents to clinic today for:  Chief Complaint  Patient presents with   [facility]    [facility] with out callous A1c 5.7 in May ASA   Patient notes nails are thick, discolored, elongated and painful in shoegear when trying to ambulate.  He is using a walker for assistance today.  PCP is Rollene Almarie LABOR, MD.  Past Medical History:  Diagnosis Date   Allergic rhinitis    Bladder cancer (HCC)    BPH (benign prostatic hyperplasia)    Bradycardia    CAD (coronary artery disease)    Chronic low back pain    COPD (chronic obstructive pulmonary disease) (HCC)    DDD (degenerative disc disease), lumbosacral    Depression    Diverticulosis of colon (without mention of hemorrhage)    Essential tremor    Fatty liver disease, nonalcoholic    GERD (gastroesophageal reflux disease)    Glaucoma    History of bladder cancer    2004--  TCC low-grade , non-invasive   History of colon polyps    History of exercise stress test    02-05-2007-- ETT (Clinically positive, electrically equivocal, submaximal ETT,  appropriate bp response to exercise   History of hiatal hernia    History of kidney stones    History of peptic ulcer    HLD (hyperlipidemia)    HTN (hypertension)    Mild CAD    Nocturia    Pneumonia    Productive cough    Rheumatoid arthritis(714.0)    Right renal artery stenosis    per cath 03-14-2009 --- 40-50%   RLS (restless legs syndrome)    Seizures (HCC)    Stroke (HCC)    TIA   Vertigo, intermittent    Weak urinary stream    Wears dentures    upper   Wears glasses    Wears hearing aid    bilateral   Wears partial dentures    lower    Past Surgical History:  Procedure Laterality Date   CARDIAC CATHETERIZATION  02-12-2007  dr gregg taylor   Non-obstructive CAD,  20% pLCX,  20% LAD, ef 65%   CARDIAC CATHETERIZATION  03-14-2009  dr delford   pLAD and mid diaginal  20-30% multiple lesions,  OM1 20%, right renal ostial 40-50%,  ef 60%   CATARACT EXTRACTION W/ INTRAOCULAR LENS  IMPLANT, BILATERAL  07/14/2014   CYSTOSCOPY WITH BIOPSY N/A 07/11/2015   Procedure: CYSTOSCOPY WITH COLD CUP RESECTION AND FULGERATION;  Surgeon: Alm Fragmin, MD;  [facility]  Service: Urology;  Laterality: N/A;   HIP ARTHROSCOPY W/ LABRAL DEBRIDEMENT Left 06/29/2000   and chondraplasty   JOINT REPLACEMENT     right big toe replacement   KYPHOPLASTY N/A 03/28/2021   Procedure: KYPHOPLASTY lumbar one;  Surgeon: Burnetta Aures, MD;  Location: [facility] OR;  Service: Orthopedics;  Laterality: N/A;   LUMBAR DISC SURGERY  07/16/2005   LUMBAR LAMINECTOMY/DECOMPRESSION MICRODISCECTOMY  07/09/2011   Procedure: LUMBAR LAMINECTOMY/DECOMPRESSION MICRODISCECTOMY;  Surgeon: Reyes JAYSON Billing;  Location: [facility];  Service: Orthopedics;  Laterality: Left;  Decompression L5 - S1 on the Left and repair of dura   LUMBAR LAMINECTOMY/DECOMPRESSION MICRODISCECTOMY N/A 07/27/2013   Procedure: DECOMPRESSION L4 - L5 WITH EXCISION OF SYNOVIAL CYST AND, LATERAL MASS FUSION 1 LEVEL;  Surgeon: Reyes JAYSON Billing, MD;  Location: [facility];  Service: Orthopedics;  Laterality: N/A;   LUMBAR LAMINECTOMY/DECOMPRESSION MICRODISCECTOMY Left 07/06/2014   Procedure:  MICRO LUMBAR DECOMPRESSION L5-S1 ON LEFT, L4-5 REDO;  Surgeon: Reyes JAYSON Billing, MD;  Location: [facility];  Service: Orthopedics;  Laterality: Left;   SPINAL CORD STIMULATOR BATTERY EXCHANGE N/A 03/28/2021   Procedure: SPINAL CORD STIMULATOR BATTERY EXCHANGE/REPLACEMENT, L1 KYPHOPLASTY;  Surgeon: Burnetta Aures, MD;  Location: [facility] OR;  Service: Orthopedics;  Laterality: N/A;  90 MINS LOCAL WITH IV REG   SPINAL CORD STIMULATOR INSERTION N/A 09/11/2016   Procedure: Spinal cord stimulator placement;  Surgeon: Aures Burnetta, MD;  Location: [facility] OR;  Service: Orthopedics;  Laterality: N/A;   TONSILLECTOMY AND ADENOIDECTOMY     TRANSTHORACIC ECHOCARDIOGRAM   09/20/2010   normal LVF, ef 55-65%/  mild MR, TR and PR/  mild LAE   TRANSURETHRAL RESECTION OF BLADDER TUMOR  09/21/2002    Allergies  Allergen Reactions   Morphine  And Codeine Other (See Comments)    Migraine    Sulfa Antibiotics Itching, Rash and Other (See Comments)   Hydrochlorothiazide  Other (See Comments)    hyponatremia with daily use   Tape Itching and Other (See Comments)    Patient  Is ok to use paper tape   Latex Rash   Lisinopril  Cough   Terazosin Other (See Comments)    Dizziness    Review of Systems: Negative except as noted in the HPI.  Objective:  [name] is a pleasant 88 y.o. male in NAD. AAO x 3.  Vascular Examination: Capillary refill time is 3-5 seconds to toes bilateral. Palpable pedal pulses b/l LE. Digital hair sparse b/l.  Skin temperature gradient WNL b/l.   Dermatological Examination: Pedal skin with decreased turgor, texture and tone b/l. No open wounds. No interdigital macerations b/l. Toenails x10 are 3mm thick, discolored, dystrophic with subungual debris. There is pain with compression of the nail plates.  They are elongated x10  Assessment/Plan: 1. Pain due to onychomycosis of toenails of both feet    The mycotic toenails were sharply debrided x10 with sterile nail nippers and a power debriding burr to decrease bulk/thickness and length.    Return in about 3 months (around 07/30/2024) for RFC.   Awanda CHARM Imperial, DPM, FACFAS [facility]     [address] [place], [place] [address]                Office [phone]  Fax [phone]

## 2024-05-19 ENCOUNTER — Telehealth: Payer: Self-pay | Admitting: Cardiology

## 2024-05-19 DIAGNOSIS — E782 Mixed hyperlipidemia: Secondary | ICD-10-CM

## 2024-05-19 MED ORDER — ATORVASTATIN CALCIUM 40 MG PO TABS: 40.0000 mg | ORAL_TABLET | Freq: Every day | ORAL | 1 refills | Status: DC

## 2024-05-19 NOTE — Telephone Encounter (Signed)
 Refill sent.

## 2024-05-19 NOTE — Telephone Encounter (Signed)
*  STAT* If patient is at the pharmacy, call can be transferred to refill team.   1. Which medications need to be refilled? (please list name of each medication and dose if known)  atorvastatin  (LIPITOR ) 40 MG tablet  2. Which pharmacy/location (including street and city if local pharmacy) is medication to be sent to? [id number] - [place], [place] - [address]  3. Do they need a 30 day or 90 day supply?  90 day supply

## 2024-06-11 ENCOUNTER — Other Ambulatory Visit: Payer: Self-pay | Admitting: Cardiology

## 2024-06-11 DIAGNOSIS — E782 Mixed hyperlipidemia: Secondary | ICD-10-CM

## 2024-07-12 NOTE — Progress Notes (Signed)
 "   Mild Cognitive Impairment due to Alzheimer's Disease    [name] is a very pleasant 88 y.o. RH male with a history of hypertension, hyperlipidemia COPD, rheumatoid arthritis, CHF, remoteTIA, essential tremor of the right hand, BPH, CKD, carotid artery stenosis, documented history of bradycardia with near syncopal episode 12/10/2023, DM2, HOH, prediabetes  presenting today in follow-up for evaluation of memory loss. Patient is on memantine  5 mg twice daily***. Patient was last seen on 12/24/23, with last MMSE (unable to do MoCA due to reading limitations) was 17/30.***.  Patient is able to participate on ADLs and to to drive without difficulties. Mood is ***. This patient is accompanied in the office by his son and his wife***  who supplements the history. Previous records as well as any outside records available were reviewed prior to todays visit   Continue memantine  , increase to 10 mg twice daily, side effects discussed Continue to control mood as per PCP Recommend good control of cardiovascular risk factors Agree with sleep studies, he has an appointment on January 2026 at [facility] for possible OPH, follow-up with cardiology [place] up in  months***     Discussed the use of AI scribe software for clinical note transcription with the patient, who gave verbal consent to proceed.  History of Present Illness      Initial visit 10/16/2023 How long did patient have memory difficulties?  For about more than 6 years, may be more little things-son says.  His main difficulties with short-term memory although they may be instances in which his LTM may be affected.   repeats oneself?  Endorsed. Disoriented when walking into a room?  Patient denies    Leaving objects in unusual places?  Endorsed, including today, when he could not find his phone.  However, objects have not found in unusual places  Wandering behavior? Denies.  His son has placed a camera on the porch for extra security  which is attached to the son's cell phone  Any personality changes, or depression, anxiety? Denies. He  may some grouchy moments.   Hallucinations or paranoia? A couple of times he mentioned to his daughter that he was talking to his dead wife.  Seizures? Denies.    Any sleep changes?  Sleeps well on and off. Takes  1THC gummie twice at night, given by his pain medicine doctor with some relief.  This was given by his pain provider.  Denies vivid dreams, denies REM behavior or sleepwalking.  However in the past, he has been evaluated for sleep apnea and RLS, referred to sleep studies, as he denies having a formal diagnosis.   Any hygiene concerns?  Denies.   Independent of bathing and dressing? Endorsed  Does the patient need help with medications? Daughter in law is in charge, uses a pillbox.  It is not clear if the patient may take more or less medication than what is prescribed. Who is in charge of the finances? Son and his wife in charge     Any changes in appetite?  Decreased, because he wakes up late .   Patient have trouble swallowing?  Denies.   Does the patient cook? No  Any headaches?  Denies.   Chronic pain? Endorsed, has a stimulator, and receives back injections (Dr. Bonner) and he is on gabapentin .  He also has chronic knee pain, follows orthopedics Ambulates with difficulty? Denies  Needs a walker  to ambulate for stability.  Denies any shuffling.  Walks slowly due  to knee pain. Recent falls or head injuries? Denies.     Vision changes?  Denies any new issues. Has a history of glaucoma Any strokelike symptoms? Denies.  She has a history of dizziness, for many years, but is not described as vertigo, when I get up quickly, blood pressure changes . Any tremors? R hand essential tremor followed at the [facility] by neurology, stable, he takes primidone .  Denies rest tremors. Any anosmia? Denies.   Any incontinence of urine? Has occasional recurrent UTI, history of bladder cancer,  currently stable Any bowel dysfunction? Denies.      Patient lives with his son History of heavy alcohol  intake? Denies.   History of heavy tobacco use? Denies.   Family history of dementia?  2  Sisters switch dementia  Does patient drive?Quit driving 5 years ago because my reaction time was off.      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   Plasma Biomarkers :  ABeta 42 was 51, ABeta 40 was 301 pTau 42/40 ratio  0.169 (low) NFL . pTau 181 1.19 (high). pTau 217  0.34(high).   Past Medical History:  Diagnosis Date   Allergic rhinitis    Bladder cancer (HCC)    BPH (benign prostatic hyperplasia)    Bradycardia    CAD (coronary artery disease)    Chronic low back pain    COPD (chronic obstructive pulmonary disease) (HCC)    DDD (degenerative disc disease), lumbosacral    Depression    Diverticulosis of colon (without mention of hemorrhage)    Essential tremor    Fatty liver disease, nonalcoholic    GERD (gastroesophageal reflux disease)    Glaucoma    History of bladder cancer    2004--  TCC low-grade , non-invasive   History of colon polyps    History of exercise stress test    02-05-2007-- ETT (Clinically positive, electrically equivocal, submaximal ETT,  appropriate bp response to exercise   History of hiatal hernia    History of kidney stones    History of peptic ulcer    HLD (hyperlipidemia)    HTN (hypertension)    Mild CAD    Nocturia    Pneumonia    Productive cough    Rheumatoid arthritis(714.0)    Right renal artery stenosis    per cath 03-14-2009 --- 40-50%   RLS (restless legs syndrome)    Seizures (HCC)    Stroke (HCC)    TIA   Vertigo, intermittent    Weak  urinary stream    Wears dentures    upper   Wears glasses    Wears hearing aid    bilateral   Wears partial dentures    lower     Past Surgical History:  Procedure Laterality Date   CARDIAC CATHETERIZATION  02-12-2007  dr gregg taylor   Non-obstructive CAD,  20% pLCX,  20% LAD, ef 65%   CARDIAC CATHETERIZATION  03-14-2009  dr delford   pLAD and mid diaginal 20-30% multiple lesions,  OM1 20%, right renal ostial 40-50%,  ef 60%   CATARACT EXTRACTION W/ INTRAOCULAR LENS  IMPLANT, BILATERAL  07/14/2014   CYSTOSCOPY WITH BIOPSY N/A 07/11/2015   Procedure: CYSTOSCOPY WITH COLD CUP RESECTION AND FULGERATION;  Surgeon: Alm Fragmin, MD;  Location: [facility];  Service: Urology;  Laterality: N/A;   HIP ARTHROSCOPY W/ LABRAL DEBRIDEMENT Left 06/29/2000   and chondraplasty   JOINT REPLACEMENT     right big toe replacement   KYPHOPLASTY N/A 03/28/2021   Procedure: KYPHOPLASTY lumbar one;  Surgeon: Burnetta Aures, MD;  [facility]: [facility] OR;  Service: Orthopedics;  Laterality: N/A;   LUMBAR DISC SURGERY  07/16/2005   LUMBAR LAMINECTOMY/DECOMPRESSION MICRODISCECTOMY  07/09/2011   Procedure: LUMBAR LAMINECTOMY/DECOMPRESSION MICRODISCECTOMY;  Surgeon: Reyes JAYSON Billing;  Location: [facility];  Service: Orthopedics;  Laterality: Left;  Decompression L5 - S1 on the Left and repair of dura   LUMBAR LAMINECTOMY/DECOMPRESSION MICRODISCECTOMY N/A 07/27/2013   Procedure: DECOMPRESSION L4 - L5 WITH EXCISION OF SYNOVIAL CYST AND, LATERAL MASS FUSION 1 LEVEL;  Surgeon: Reyes JAYSON Billing, MD;  Location: [facility];  Service: Orthopedics;  Laterality: N/A;   LUMBAR LAMINECTOMY/DECOMPRESSION MICRODISCECTOMY Left 07/06/2014   Procedure:  MICRO LUMBAR DECOMPRESSION L5-S1 ON LEFT, L4-5 REDO;  Surgeon: Reyes JAYSON Billing, MD;  Location: [facility];  Service: Orthopedics;  Laterality: Left;   SPINAL CORD STIMULATOR BATTERY EXCHANGE N/A 03/28/2021   Procedure: SPINAL CORD STIMULATOR BATTERY EXCHANGE/REPLACEMENT, L1 KYPHOPLASTY;   Surgeon: Burnetta Aures, MD;  Location: [facility];  Service: Orthopedics;  Laterality: N/A;  90 MINS LOCAL WITH IV REG   SPINAL CORD STIMULATOR INSERTION N/A 09/11/2016   Procedure: Spinal cord stimulator placement;  Surgeon: Aures Burnetta, MD;  Location: [facility];  Service: Orthopedics;  Laterality: N/A;   TONSILLECTOMY AND ADENOIDECTOMY     TRANSTHORACIC ECHOCARDIOGRAM  09/20/2010   normal LVF, ef 55-65%/  mild MR, TR and PR/  mild LAE   TRANSURETHRAL RESECTION OF BLADDER TUMOR  09/21/2002         Objective:     PHYSICAL EXAMINATION:    VITALS:  There were no vitals filed for this visit.  GEN:  The patient appears stated age and is in NAD. HEENT:  Normocephalic, atraumatic.   Neurological examination:  General: NAD, well-groomed, appears stated age. Orientation: The patient is alert. Oriented to person, place and not to date.*** Cranial nerves: There is good facial symmetry.The speech is fluent and clear. No aphasia or dysarthria. Fund of knowledge is appropriate. Recent memory impaired and remote memory is normal.  Attention and concentration are normal.  Able to name objects and repeat phrases.  Hearing is intact to conversational tone ***.   Delayed recall *** Sensation: Sensation is intact to light touch throughout Motor: Strength is at least antigravity x4. DTR's 2/4 in UE/LE       No data to display             10/16/2023    4:00 PM 12/14/2017    9:47 AM  MMSE - Mini Mental State Exam  Orientation to time 4 5  Orientation to Place 3 5  Registration 3 3  Attention/ Calculation 0 3  Recall 1 3  Language- name 2 objects 2 2  Language- repeat 1 1  Language- follow 3 step command 2 3  Language- read & follow direction 1 1  Write a sentence 0 1  Copy design 0 1  Total score 17 28      Movement examination: Tone: There is normal tone in the UE/LE Abnormal movements:  no tremor.  No myoclonus.  No asterixis.   Coordination:  There is no decremation with RAM's. Normal  finger to nose  Gait and Station: The patient has  no difficulty arising out of a deep-seated chair without the use of the hands. The patient's stride length is good.  Gait is cautious and narrow.   Thank you for allowing us  the opportunity to participate in the care of this nice patient. Please do not hesitate to contact us  for any questions or concerns.   Total time spent on today's visit was *** minutes dedicated to this patient today, preparing to see patient, examining the patient, ordering tests and/or medications and counseling the patient, documenting clinical information in the EHR or other health record, independently interpreting results and communicating results to the patient/family, discussing treatment and goals, answering patient's questions and coordinating care.  Cc:  Rollene Almarie LABOR, MD  Camie Sevin 07/12/2024 5:56 AM      "

## 2024-07-13 ENCOUNTER — Encounter: Payer: Self-pay | Admitting: Physician Assistant

## 2024-07-13 ENCOUNTER — Ambulatory Visit (INDEPENDENT_AMBULATORY_CARE_PROVIDER_SITE_OTHER): Admitting: Physician Assistant

## 2024-07-13 VITALS — BP 151/77 | HR 64 | Resp 18 | Wt 165.0 lb

## 2024-07-13 DIAGNOSIS — R413 Other amnesia: Secondary | ICD-10-CM

## 2024-07-13 NOTE — Patient Instructions (Addendum)
 It was a pleasure to see you today at our office.   Recommendations:  July 8 at 11:30  memantine  5 mg  twice a day     [email or web address]   RECOMMENDATIONS FOR ALL PATIENTS WITH MEMORY PROBLEMS: 1. Continue to exercise (Recommend 30 minutes of walking everyday, or 3 hours every week) 2. Increase social interactions - continue going to [place] and enjoy social gatherings with friends and family 3. Eat healthy, avoid fried foods and eat more fruits and vegetables 4. Maintain adequate blood pressure, blood sugar, and blood cholesterol level. Reducing the risk of stroke and cardiovascular disease also helps promoting better memory. 5. Avoid stressful situations. Live a simple life and avoid aggravations. Organize your time and prepare for the next day in anticipation. 6. Sleep well, avoid any interruptions of sleep and avoid any distractions in the bedroom that may interfere with adequate sleep quality 7. Avoid sugar, avoid sweets as there is a strong link between excessive sugar intake, diabetes, and cognitive impairment We discussed the Mediterranean diet, which has been shown to help patients reduce the risk of progressive memory disorders and reduces cardiovascular risk. This includes eating fish, eat fruits and green leafy vegetables, nuts like almonds and hazelnuts, walnuts, and also use olive oil. Avoid fast foods and fried foods as much as possible. Avoid sweets and sugar as sugar use has been linked to worsening of memory function.  There is always a concern of gradual progression of memory problems. If this is the case, then we may need to adjust level of care according to patient needs. Support, both to the patient and caregiver, should then be put into place.         DRIVING: Regarding driving, in patients with progressive memory problems, driving will be impaired. We advise to have someone else do the driving if trouble  finding directions or if minor accidents are reported. Independent driving assessment is available to determine safety of driving.   If you are interested in the driving assessment, you can contact the following:  [employer] in [place] [phone]  [facility] [phone]  [facility] [phone]  [facility] [phone] or [phone]   FALL PRECAUTIONS: Be cautious when walking. Scan the area for obstacles that may increase the risk of trips and falls. When getting up in the mornings, sit up at the edge of the bed for a few minutes before getting out of bed. Consider elevating the bed at the head end to avoid drop of blood pressure when getting up. Walk always in a well-lit room (use night lights in the walls). Avoid area rugs or power cords from appliances in the middle of the walkways. Use a walker or a cane if necessary and consider physical therapy for balance exercise. Get your eyesight checked regularly.  FINANCIAL OVERSIGHT: Supervision, especially oversight when making financial decisions or transactions is also recommended.  HOME SAFETY: Consider the safety of the kitchen when operating appliances like stoves, microwave oven, and blender. Consider having supervision and share cooking responsibilities until no longer able to participate in those. Accidents with firearms and other hazards in the house should be identified and addressed as well.   ABILITY TO BE LEFT ALONE: If patient is unable to contact 911 operator, consider using LifeLine, or when the need is there, arrange for someone to stay with patients. Smoking is a fire hazard, consider supervision or cessation. Risk of wandering should be assessed by caregiver and if detected at any point,  supervision and safe proof recommendations should be instituted.  MEDICATION SUPERVISION: Inability to self-administer medication needs to be constantly addressed. Implement a  mechanism to ensure safe administration of the medications.      Mediterranean Diet A Mediterranean diet refers to food and lifestyle choices that are based on the traditions of countries located on the [employer] [place]. This way of eating has been shown to help prevent certain conditions and improve outcomes for people who have chronic diseases, like kidney disease and heart disease. What are tips for following this plan? Lifestyle  Cook and eat meals together with your family, when possible. Drink enough fluid to keep your urine clear or pale yellow. Be physically active every day. This includes: Aerobic exercise like running or swimming. Leisure activities like gardening, walking, or housework. Get 7-8 hours of sleep each night. If recommended by your health care provider, drink red wine in moderation. This means 1 glass a day for nonpregnant women and 2 glasses a day for men. A glass of wine equals 5 oz (150 mL). Reading food labels  Check the serving size of packaged foods. For foods such as rice and pasta, the serving size refers to the amount of cooked product, not dry. Check the total fat in packaged foods. Avoid foods that have saturated fat or trans fats. Check the ingredients list for added sugars, such as corn syrup. Shopping  At the grocery store, buy most of your food from the areas near the walls of the store. This includes: Fresh fruits and vegetables (produce). Grains, beans, nuts, and seeds. Some of these may be available in unpackaged forms or large amounts (in bulk). Fresh seafood. Poultry and eggs. Low-fat dairy products. Buy whole ingredients instead of prepackaged foods. Buy fresh fruits and vegetables in-season from local farmers markets. Buy frozen fruits and vegetables in resealable bags. If you do not have access to quality fresh seafood, buy precooked frozen shrimp or canned fish, such as tuna, salmon, or sardines. Buy small amounts of raw or cooked  vegetables, salads, or olives from the deli or salad bar at your store. Stock your pantry so you always have certain foods on hand, such as olive oil, canned tuna, canned tomatoes, rice, pasta, and beans. Cooking  Cook foods with extra-virgin olive oil instead of using butter or other vegetable oils. Have meat as a side dish, and have vegetables or grains as your main dish. This means having meat in small portions or adding small amounts of meat to foods like pasta or stew. Use beans or vegetables instead of meat in common dishes like chili or lasagna. Experiment with different cooking methods. Try roasting or broiling vegetables instead of steaming or sauteing them. Add frozen vegetables to soups, stews, pasta, or rice. Add nuts or seeds for added healthy fat at each meal. You can add these to yogurt, salads, or vegetable dishes. Marinate fish or vegetables using olive oil, lemon juice, garlic, and fresh herbs. Meal planning  Plan to eat 1 vegetarian meal one day each week. Try to work up to 2 vegetarian meals, if possible. Eat seafood 2 or more times a week. Have healthy snacks readily available, such as: Vegetable sticks with hummus. Greek yogurt. Fruit and nut trail mix. Eat balanced meals throughout the week. This includes: Fruit: 2-3 servings a day Vegetables: 4-5 servings a day Low-fat dairy: 2 servings a day Fish, poultry, or lean meat: 1 serving a day Beans and legumes: 2 or more servings a week Nuts and  seeds: 1-2 servings a day Whole grains: 6-8 servings a day Extra-virgin olive oil: 3-4 servings a day Limit red meat and sweets to only a few servings a month What are my food choices? Mediterranean diet Recommended Grains: Whole-grain pasta. Brown rice. Bulgar wheat. Polenta. Couscous. Whole-wheat bread. [place] Made[place]. Vegetables: Artichokes. Beets. Broccoli. Cabbage. Carrots. Eggplant. Green beans. Chard. Kale. Spinach. Onions. Leeks. Peas. Squash. Tomatoes. Peppers.  Radishes. Fruits: Apples. Apricots. Avocado. Berries. Bananas. Cherries. Dates. Figs. Grapes. Lemons. Melon. Oranges. Peaches. Plums. Pomegranate. Meats and other protein foods: Beans. Almonds. Sunflower seeds. Pine nuts. Peanuts. Cod. Salmon. Scallops. Shrimp. Tuna. Tilapia. Clams. Oysters. Eggs. Dairy: Low-fat milk. Cheese. Greek yogurt. Beverages: Water . Red wine. Herbal tea. Fats and oils: Extra virgin olive oil. Avocado oil. Grape seed oil. Sweets and desserts: Greek yogurt with honey. Baked apples. Poached pears. Trail mix. Seasoning and other foods: Basil. Cilantro. Coriander. Cumin. Mint. Parsley. Sage. Rosemary. Tarragon. Garlic. Oregano. Thyme. Pepper. Balsalmic vinegar. Tahini. Hummus. Tomato sauce. Olives. Mushrooms. Limit these Grains: Prepackaged pasta or rice dishes. Prepackaged cereal with added sugar. Vegetables: Deep fried potatoes (french fries). Fruits: Fruit canned in syrup. Meats and other protein foods: Beef. Pork. Lamb. Poultry with skin. Hot dogs. [place]. Dairy: Ice cream. Sour cream. Whole milk. Beverages: Juice. Sugar-sweetened soft drinks. Beer. Liquor and spirits. Fats and oils: Butter. Canola oil. Vegetable oil. Beef fat (tallow). Lard. Sweets and desserts: Cookies. Cakes. Pies. Candy. Seasoning and other foods: Mayonnaise. Premade sauces and marinades. The items listed may not be a complete list. Talk with your dietitian about what dietary choices are right for you. Summary The Mediterranean diet includes both food and lifestyle choices. Eat a variety of fresh fruits and vegetables, beans, nuts, seeds, and whole grains. Limit the amount of red meat and sweets that you eat. Talk with your health care provider about whether it is safe for you to drink red wine in moderation. This means 1 glass a day for nonpregnant women and 2 glasses a day for men. A glass of wine equals 5 oz (150 mL). This information is not intended to replace advice given to you by your health  care provider. Make sure you discuss any questions you have with your health care provider. Document Released: 02/21/2016 Document Revised: 03/25/2016 Document Reviewed: 02/21/2016 Elsevier Interactive Patient Education  2017 [employer].

## 2024-07-14 ENCOUNTER — Encounter: Payer: Self-pay | Admitting: Physician Assistant

## 2024-07-18 ENCOUNTER — Other Ambulatory Visit: Payer: Self-pay | Admitting: Internal Medicine

## 2024-07-25 ENCOUNTER — Ambulatory Visit: Admitting: Internal Medicine

## 2024-07-28 ENCOUNTER — Ambulatory Visit: Admitting: Podiatry

## 2025-01-18 ENCOUNTER — Ambulatory Visit: Payer: Self-pay | Admitting: Physician Assistant

## 2025-04-17 ENCOUNTER — Ambulatory Visit
